# Patient Record
Sex: Male | Born: 1955
Health system: Southern US, Community
[De-identification: ages and names within clinical notes are randomized; demographics above are authoritative.]

## PROBLEM LIST (undated history)

## (undated) DIAGNOSIS — I77810 Thoracic aortic ectasia: Secondary | ICD-10-CM

## (undated) DIAGNOSIS — E119 Type 2 diabetes mellitus without complications: Secondary | ICD-10-CM

## (undated) DIAGNOSIS — I10 Essential (primary) hypertension: Secondary | ICD-10-CM

## (undated) DIAGNOSIS — E785 Hyperlipidemia, unspecified: Secondary | ICD-10-CM

## (undated) DIAGNOSIS — E559 Vitamin D deficiency, unspecified: Secondary | ICD-10-CM

## (undated) DIAGNOSIS — K635 Polyp of colon: Secondary | ICD-10-CM

## (undated) DIAGNOSIS — J449 Chronic obstructive pulmonary disease, unspecified: Secondary | ICD-10-CM

## (undated) DIAGNOSIS — F259 Schizoaffective disorder, unspecified: Secondary | ICD-10-CM

## (undated) DIAGNOSIS — I739 Peripheral vascular disease, unspecified: Secondary | ICD-10-CM

## (undated) DIAGNOSIS — I2510 Atherosclerotic heart disease of native coronary artery without angina pectoris: Secondary | ICD-10-CM

## (undated) DIAGNOSIS — Z87898 Personal history of other specified conditions: Secondary | ICD-10-CM

## (undated) DIAGNOSIS — M51369 Other intervertebral disc degeneration, lumbar region without mention of lumbar back pain or lower extremity pain: Secondary | ICD-10-CM

## (undated) DIAGNOSIS — I4891 Unspecified atrial fibrillation: Secondary | ICD-10-CM

## (undated) DIAGNOSIS — I428 Other cardiomyopathies: Secondary | ICD-10-CM

## (undated) DIAGNOSIS — R0600 Dyspnea, unspecified: Secondary | ICD-10-CM

## (undated) DIAGNOSIS — B1920 Unspecified viral hepatitis C without hepatic coma: Secondary | ICD-10-CM

## (undated) DIAGNOSIS — M7730 Calcaneal spur, unspecified foot: Secondary | ICD-10-CM

## (undated) DIAGNOSIS — K219 Gastro-esophageal reflux disease without esophagitis: Secondary | ICD-10-CM

## (undated) DIAGNOSIS — Z7901 Long term (current) use of anticoagulants: Secondary | ICD-10-CM

## (undated) DIAGNOSIS — K5900 Constipation, unspecified: Secondary | ICD-10-CM

## (undated) DIAGNOSIS — K4090 Unilateral inguinal hernia, without obstruction or gangrene, not specified as recurrent: Secondary | ICD-10-CM

## (undated) DIAGNOSIS — D122 Benign neoplasm of ascending colon: Secondary | ICD-10-CM

## (undated) DIAGNOSIS — I5020 Unspecified systolic (congestive) heart failure: Secondary | ICD-10-CM

## (undated) DIAGNOSIS — D696 Thrombocytopenia, unspecified: Secondary | ICD-10-CM

## (undated) DIAGNOSIS — G2581 Restless legs syndrome: Secondary | ICD-10-CM

## (undated) DIAGNOSIS — I499 Cardiac arrhythmia, unspecified: Secondary | ICD-10-CM

## (undated) DIAGNOSIS — M1990 Unspecified osteoarthritis, unspecified site: Secondary | ICD-10-CM

## (undated) DIAGNOSIS — I700 Atherosclerosis of aorta: Secondary | ICD-10-CM

## (undated) DIAGNOSIS — I272 Pulmonary hypertension, unspecified: Secondary | ICD-10-CM

## (undated) DIAGNOSIS — Z972 Presence of dental prosthetic device (complete) (partial): Secondary | ICD-10-CM

## (undated) DIAGNOSIS — J45909 Unspecified asthma, uncomplicated: Secondary | ICD-10-CM

## (undated) DIAGNOSIS — Z9841 Cataract extraction status, right eye: Secondary | ICD-10-CM

## (undated) DIAGNOSIS — Z8719 Personal history of other diseases of the digestive system: Secondary | ICD-10-CM

## (undated) DIAGNOSIS — K5790 Diverticulosis of intestine, part unspecified, without perforation or abscess without bleeding: Secondary | ICD-10-CM

## (undated) DIAGNOSIS — Z87442 Personal history of urinary calculi: Secondary | ICD-10-CM

## (undated) HISTORY — DX: Chronic obstructive pulmonary disease, unspecified: J44.9

## (undated) HISTORY — DX: Unilateral inguinal hernia, without obstruction or gangrene, not specified as recurrent: K40.90

## (undated) HISTORY — PX: HERNIA REPAIR: SHX51

## (undated) HISTORY — DX: Type 2 diabetes mellitus without complications: E11.9

## (undated) HISTORY — DX: Hyperlipidemia, unspecified: E78.5

## (undated) HISTORY — PX: SINUSOTOMY: SHX291

## (undated) HISTORY — DX: Schizoaffective disorder, unspecified: F25.9

## (undated) HISTORY — PX: BACK SURGERY: SHX140

## (undated) HISTORY — PX: EYE SURGERY: SHX253

## (undated) HISTORY — DX: Essential (primary) hypertension: I10

## (undated) HISTORY — DX: Unspecified viral hepatitis C without hepatic coma: B19.20

## (undated) HISTORY — DX: Vitamin D deficiency, unspecified: E55.9

---

## 2004-11-20 ENCOUNTER — Emergency Department: Payer: Self-pay | Admitting: General Practice

## 2007-12-11 ENCOUNTER — Ambulatory Visit: Payer: Self-pay | Admitting: Family Medicine

## 2009-04-26 DIAGNOSIS — E559 Vitamin D deficiency, unspecified: Secondary | ICD-10-CM | POA: Insufficient documentation

## 2011-08-31 DIAGNOSIS — I10 Essential (primary) hypertension: Secondary | ICD-10-CM | POA: Diagnosis not present

## 2011-08-31 DIAGNOSIS — E785 Hyperlipidemia, unspecified: Secondary | ICD-10-CM | POA: Diagnosis not present

## 2011-08-31 DIAGNOSIS — J449 Chronic obstructive pulmonary disease, unspecified: Secondary | ICD-10-CM | POA: Diagnosis not present

## 2011-08-31 DIAGNOSIS — E118 Type 2 diabetes mellitus with unspecified complications: Secondary | ICD-10-CM | POA: Diagnosis not present

## 2012-03-05 DIAGNOSIS — I10 Essential (primary) hypertension: Secondary | ICD-10-CM | POA: Diagnosis not present

## 2012-03-05 DIAGNOSIS — E118 Type 2 diabetes mellitus with unspecified complications: Secondary | ICD-10-CM | POA: Diagnosis not present

## 2012-03-05 DIAGNOSIS — E785 Hyperlipidemia, unspecified: Secondary | ICD-10-CM | POA: Diagnosis not present

## 2012-03-05 DIAGNOSIS — E1142 Type 2 diabetes mellitus with diabetic polyneuropathy: Secondary | ICD-10-CM | POA: Diagnosis not present

## 2012-09-02 DIAGNOSIS — E118 Type 2 diabetes mellitus with unspecified complications: Secondary | ICD-10-CM | POA: Diagnosis not present

## 2012-09-02 DIAGNOSIS — I10 Essential (primary) hypertension: Secondary | ICD-10-CM | POA: Diagnosis not present

## 2012-09-02 DIAGNOSIS — J449 Chronic obstructive pulmonary disease, unspecified: Secondary | ICD-10-CM | POA: Diagnosis not present

## 2012-09-02 DIAGNOSIS — E785 Hyperlipidemia, unspecified: Secondary | ICD-10-CM | POA: Diagnosis not present

## 2013-03-06 DIAGNOSIS — Z125 Encounter for screening for malignant neoplasm of prostate: Secondary | ICD-10-CM | POA: Diagnosis not present

## 2013-03-06 DIAGNOSIS — Z1159 Encounter for screening for other viral diseases: Secondary | ICD-10-CM | POA: Diagnosis not present

## 2013-03-06 DIAGNOSIS — J449 Chronic obstructive pulmonary disease, unspecified: Secondary | ICD-10-CM | POA: Diagnosis not present

## 2013-03-06 DIAGNOSIS — E118 Type 2 diabetes mellitus with unspecified complications: Secondary | ICD-10-CM | POA: Diagnosis not present

## 2013-03-06 DIAGNOSIS — I10 Essential (primary) hypertension: Secondary | ICD-10-CM | POA: Diagnosis not present

## 2013-03-06 DIAGNOSIS — E785 Hyperlipidemia, unspecified: Secondary | ICD-10-CM | POA: Diagnosis not present

## 2013-09-08 DIAGNOSIS — E785 Hyperlipidemia, unspecified: Secondary | ICD-10-CM | POA: Diagnosis not present

## 2013-09-08 DIAGNOSIS — I10 Essential (primary) hypertension: Secondary | ICD-10-CM | POA: Diagnosis not present

## 2013-09-08 DIAGNOSIS — I739 Peripheral vascular disease, unspecified: Secondary | ICD-10-CM | POA: Diagnosis not present

## 2013-09-08 DIAGNOSIS — E118 Type 2 diabetes mellitus with unspecified complications: Secondary | ICD-10-CM | POA: Diagnosis not present

## 2014-03-12 DIAGNOSIS — I10 Essential (primary) hypertension: Secondary | ICD-10-CM | POA: Diagnosis not present

## 2014-03-12 DIAGNOSIS — G609 Hereditary and idiopathic neuropathy, unspecified: Secondary | ICD-10-CM | POA: Diagnosis not present

## 2014-03-12 DIAGNOSIS — E118 Type 2 diabetes mellitus with unspecified complications: Secondary | ICD-10-CM | POA: Diagnosis not present

## 2014-03-12 DIAGNOSIS — E1349 Other specified diabetes mellitus with other diabetic neurological complication: Secondary | ICD-10-CM | POA: Diagnosis not present

## 2014-03-12 DIAGNOSIS — L84 Corns and callosities: Secondary | ICD-10-CM | POA: Diagnosis not present

## 2014-03-12 DIAGNOSIS — Z0000 Encounter for general adult medical examination without abnormal findings: Secondary | ICD-10-CM | POA: Diagnosis not present

## 2014-03-12 DIAGNOSIS — E1142 Type 2 diabetes mellitus with diabetic polyneuropathy: Secondary | ICD-10-CM | POA: Diagnosis not present

## 2014-03-12 DIAGNOSIS — E785 Hyperlipidemia, unspecified: Secondary | ICD-10-CM | POA: Diagnosis not present

## 2014-03-12 DIAGNOSIS — Z1331 Encounter for screening for depression: Secondary | ICD-10-CM | POA: Diagnosis not present

## 2014-04-09 DIAGNOSIS — E1349 Other specified diabetes mellitus with other diabetic neurological complication: Secondary | ICD-10-CM | POA: Diagnosis not present

## 2014-04-09 DIAGNOSIS — G609 Hereditary and idiopathic neuropathy, unspecified: Secondary | ICD-10-CM | POA: Diagnosis not present

## 2014-04-09 DIAGNOSIS — E1142 Type 2 diabetes mellitus with diabetic polyneuropathy: Secondary | ICD-10-CM | POA: Diagnosis not present

## 2014-04-09 DIAGNOSIS — L84 Corns and callosities: Secondary | ICD-10-CM | POA: Diagnosis not present

## 2014-09-22 DIAGNOSIS — R768 Other specified abnormal immunological findings in serum: Secondary | ICD-10-CM | POA: Diagnosis not present

## 2014-09-22 DIAGNOSIS — E559 Vitamin D deficiency, unspecified: Secondary | ICD-10-CM | POA: Diagnosis not present

## 2014-09-22 DIAGNOSIS — G629 Polyneuropathy, unspecified: Secondary | ICD-10-CM | POA: Diagnosis not present

## 2014-09-22 DIAGNOSIS — J449 Chronic obstructive pulmonary disease, unspecified: Secondary | ICD-10-CM | POA: Diagnosis not present

## 2014-09-22 DIAGNOSIS — I10 Essential (primary) hypertension: Secondary | ICD-10-CM | POA: Diagnosis not present

## 2014-09-22 DIAGNOSIS — E1340 Other specified diabetes mellitus with diabetic neuropathy, unspecified: Secondary | ICD-10-CM | POA: Diagnosis not present

## 2014-09-22 DIAGNOSIS — E785 Hyperlipidemia, unspecified: Secondary | ICD-10-CM | POA: Diagnosis not present

## 2015-02-18 ENCOUNTER — Telehealth: Payer: Self-pay | Admitting: Family Medicine

## 2015-02-18 NOTE — Telephone Encounter (Signed)
Patient needs refills on Lisinopril and Pravastatin to be sent to [facility]. Patient has an appt 03/25/15.

## 2015-02-19 ENCOUNTER — Other Ambulatory Visit: Payer: Self-pay

## 2015-02-19 DIAGNOSIS — E785 Hyperlipidemia, unspecified: Secondary | ICD-10-CM

## 2015-02-19 DIAGNOSIS — I10 Essential (primary) hypertension: Secondary | ICD-10-CM

## 2015-02-19 MED ORDER — PRAVASTATIN SODIUM 40 MG PO TABS: 40.0000 mg | ORAL_TABLET | Freq: Every day | ORAL | Status: DC

## 2015-02-19 MED ORDER — LISINOPRIL 20 MG PO TABS: 20.0000 mg | ORAL_TABLET | Freq: Every day | ORAL | Status: DC

## 2015-02-19 NOTE — Telephone Encounter (Signed)
He needs follow up, I will send one month supply

## 2015-02-28 ENCOUNTER — Other Ambulatory Visit: Payer: Self-pay | Admitting: Family Medicine

## 2015-02-28 NOTE — Telephone Encounter (Signed)
Please make sure he has a follow up scheduled

## 2015-03-08 ENCOUNTER — Encounter: Payer: Self-pay | Admitting: Family Medicine

## 2015-03-08 DIAGNOSIS — I10 Essential (primary) hypertension: Secondary | ICD-10-CM | POA: Insufficient documentation

## 2015-03-08 DIAGNOSIS — L84 Corns and callosities: Secondary | ICD-10-CM | POA: Insufficient documentation

## 2015-03-08 DIAGNOSIS — E119 Type 2 diabetes mellitus without complications: Secondary | ICD-10-CM | POA: Insufficient documentation

## 2015-03-08 DIAGNOSIS — Z720 Tobacco use: Secondary | ICD-10-CM | POA: Insufficient documentation

## 2015-03-08 DIAGNOSIS — J449 Chronic obstructive pulmonary disease, unspecified: Secondary | ICD-10-CM | POA: Insufficient documentation

## 2015-03-08 DIAGNOSIS — E785 Hyperlipidemia, unspecified: Secondary | ICD-10-CM | POA: Insufficient documentation

## 2015-03-08 DIAGNOSIS — I739 Peripheral vascular disease, unspecified: Secondary | ICD-10-CM | POA: Insufficient documentation

## 2015-03-08 DIAGNOSIS — F200 Paranoid schizophrenia: Secondary | ICD-10-CM | POA: Insufficient documentation

## 2015-03-08 DIAGNOSIS — G2581 Restless legs syndrome: Secondary | ICD-10-CM | POA: Insufficient documentation

## 2015-03-08 DIAGNOSIS — G629 Polyneuropathy, unspecified: Secondary | ICD-10-CM | POA: Insufficient documentation

## 2015-03-11 ENCOUNTER — Ambulatory Visit (INDEPENDENT_AMBULATORY_CARE_PROVIDER_SITE_OTHER): Payer: Medicare Other | Admitting: Family Medicine

## 2015-03-11 ENCOUNTER — Encounter: Payer: Self-pay | Admitting: Family Medicine

## 2015-03-11 VITALS — BP 102/74 | HR 60 | Temp 97.5°F | Resp 16 | Ht 71.0 in | Wt 150.4 lb

## 2015-03-11 DIAGNOSIS — F17200 Nicotine dependence, unspecified, uncomplicated: Secondary | ICD-10-CM

## 2015-03-11 DIAGNOSIS — E785 Hyperlipidemia, unspecified: Secondary | ICD-10-CM | POA: Diagnosis not present

## 2015-03-11 DIAGNOSIS — E1340 Other specified diabetes mellitus with diabetic neuropathy, unspecified: Secondary | ICD-10-CM | POA: Diagnosis not present

## 2015-03-11 DIAGNOSIS — J449 Chronic obstructive pulmonary disease, unspecified: Secondary | ICD-10-CM

## 2015-03-11 DIAGNOSIS — D692 Other nonthrombocytopenic purpura: Secondary | ICD-10-CM

## 2015-03-11 DIAGNOSIS — G629 Polyneuropathy, unspecified: Secondary | ICD-10-CM | POA: Diagnosis not present

## 2015-03-11 DIAGNOSIS — E441 Mild protein-calorie malnutrition: Secondary | ICD-10-CM

## 2015-03-11 DIAGNOSIS — I10 Essential (primary) hypertension: Secondary | ICD-10-CM

## 2015-03-11 LAB — POCT UA - MICROALBUMIN: MICROALBUMIN (UR) POC: NEGATIVE mg/L

## 2015-03-11 LAB — POCT GLYCOSYLATED HEMOGLOBIN (HGB A1C): Hemoglobin A1C: 5.5

## 2015-03-11 MED ORDER — PRAVASTATIN SODIUM 40 MG PO TABS: 40.0000 mg | ORAL_TABLET | Freq: Every day | ORAL | Status: DC

## 2015-03-11 MED ORDER — LISINOPRIL 20 MG PO TABS: 20.0000 mg | ORAL_TABLET | Freq: Every day | ORAL | Status: DC

## 2015-03-11 MED ORDER — GABAPENTIN 100 MG PO CAPS: 100.0000 mg | ORAL_CAPSULE | Freq: Three times a day (TID) | ORAL | Status: DC

## 2015-03-11 NOTE — Progress Notes (Signed)
Name: Mark Nash   MRN: [id number]    DOB: 05/23/1956   Date:03/11/2015       Progress Note  Subjective  Chief Complaint  Chief Complaint  Patient presents with  . Medication Refill    6 month F/U  . Diabetes    not checking BG  . COPD  . Hyperlipidemia    pt state getting one through [facility] and its diffrent and making him sick on stomach  . Hypertension  . Schizophrenia    HPI  DMII: he was advised to stop using Levemir, but he continues to take 7 units daily , hgbA1C is down to 5.5 %, he denies hypoglycemia, polydipsia, polyuria or polyphagia.  He is on ace, and has neuropathy.  He states his feet pain is described as burning or tingling, sometimes numb, pain is intermittent, but happens daily   COPD: he is not on medication, because he refuses, he had daily productive cough that lasts about 45 minutes in am. He has also noticed some SOB with activity, he is still smoking about 1 and half pack of cigarettes daily   Hyperlipidemia: he is now taking another form of Pravastatin that came from mail order and has causes some nausea over the past couple of days when he started pravastatin. I explained it may be unrelated and it could be gastritis or GERD or biliary colic but he does not want to be tested at this time  HTN: taking bp medication and denies side effects, bp is towards low end of normal and we will adjust his medication today\  Schizophrenia: denies any hallucinations, no depressed mood or suicidal thoughts or ideation  Patient Active Problem List   Diagnosis Date Noted  . Benign hypertension 03/08/2015  . COPD, mild 03/08/2015  . Dyslipidemia 03/08/2015  . Paranoid schizophrenia 03/08/2015  . Acquired polyneuropathy 03/08/2015  . Restless leg 03/08/2015  . Compulsive tobacco user syndrome 03/08/2015  . Callus of foot 03/08/2015  . Claudication 03/08/2015  . Vitamin D deficiency 04/26/2009    Past Surgical History  Procedure Laterality Date  . Hernia repair       inguinal  . Sinusotomy      Family History  Problem Relation Age of Onset  . Hypertension Mother   . Diabetes Father   . Hypertension Father   . Heart disease Father   . Diverticulitis Brother     History   Social History  . Marital Status: Single    Spouse Name: N/A  . Number of Children: N/A  . Years of Education: N/A   Occupational History  . Not on file.   Social History Main Topics  . Smoking status: Current Every Day Smoker -- 1.50 packs/day for 45 years    Types: Cigarettes  . Smokeless tobacco: Never Used  . Alcohol Use: No  . Drug Use: No  . Sexual Activity: Not Currently   Other Topics Concern  . Not on file   Social History Narrative  . No narrative on file     Current outpatient prescriptions:  .  pravastatin (PRAVACHOL) 40 MG tablet, Take 1 tablet (40 mg total) by mouth daily., Disp: 90 tablet, Rfl: 1 .  Vitamin D, Ergocalciferol, (DRISDOL) 50000 UNITS CAPS capsule, , Disp: , Rfl:  .  lisinopril (PRINIVIL,ZESTRIL) 20 MG tablet, Take 1 tablet (20 mg total) by mouth daily., Disp: 90 tablet, Rfl: 1  Allergies  Allergen Reactions  . Aspirin     makes him feel weird  .  Penicillins      ROS  Constitutional: Negative for fever but has  weight change, lost 8 lbs since last visit  Respiratory: Positive  for productive cough in am and shortness of breath with activity at times   Cardiovascular: Negative for chest pain or palpitations.  Gastrointestinal: Negative for abdominal pain, no bowel changes.  Musculoskeletal: Negative for gait problem or joint swelling.  Skin: Negative for rash.  Neurological: Negative for dizziness or headache.  No other specific complaints in a complete review of systems (except as listed in HPI above).  Objective  Filed Vitals:   03/11/15 1054  BP: 102/74  Pulse: 60  Temp: 97.5 F (36.4 C)  TempSrc: Oral  Resp: 16  Height: 5\' 11"  (1.803 m)  Weight: 150 lb 6.4 oz (68.221 kg)  SpO2: 98%    Body mass  index is 20.99 kg/(m^2).  Physical Exam Constitutional: Patient appears well-developed and thin. No distress.  Eyes:  No scleral icterus.  Neck: Normal range of motion. Neck supple. Cardiovascular: Normal rate, regular rhythm and normal heart sounds.  No murmur heard. No BLE edema. Pulmonary/Chest: Effort normal and breath sounds normal. No respiratory distress. Abdominal: Soft.  There is no tenderness. Psychiatric: Patient has a normal mood and affect. behavior is normal. Judgment and thought content normal. Skin: skin on feet very thin, with some hyperpigmentation and erythema, thick toenails and callus formation, senile purpura Neuro: normal monofilament  Recent Results (from the past 2160 hour(s))  POCT HgB A1C     Status: None   Collection Time: 03/11/15 10:59 AM  Result Value Ref Range   Hemoglobin A1C 5.5   POCT UA - Microalbumin     Status: Normal   Collection Time: 03/11/15 10:59 AM  Result Value Ref Range   Microalbumin Ur, POC negative mg/L   Creatinine, POC  mg/dL   Albumin/Creatinine Ratio, Urine, POC     Diabetic Foot Exam - Simple   Simple Foot Form  Visual Inspection  See comments:  Yes  Sensation Testing  Intact to touch and monofilament testing bilaterally:  Yes  Pulse Check  Posterior Tibialis and Dorsalis pulse intact bilaterally:  Yes  Comments  He has corn formation and thick toe nails        PHQ2/9: Depression screen PHQ 2/9 03/11/2015  Decreased Interest 0  Down, Depressed, Hopeless 0  PHQ - 2 Score 0     Fall Risk: Fall Risk  03/11/2015  Falls in the past year? No      Assessment & Plan  1. Neuropathy due to secondary diabetes hgbA1C is very low, has pain on both feet, add gabapentin, stop insulin, try diet only, glucose fasting should be around 140 and post-prandially below 180 - POCT HgB A1C - POCT UA - Microalbumin  2. Benign hypertension  - Comprehensive Metabolic Panel (CMET) - lisinopril (PRINIVIL,ZESTRIL) 20 MG tablet; Take  1 tablet (20 mg total) by mouth daily.  Dispense: 90 tablet; Refill: 1  3. Protein-calorie malnutrition, mild Lost 8 lbs since last visit , we will stop insulin, explained importance of protein supplementation, he does not want any further testing at this time  4. Compulsive tobacco user syndrome Smoking 1/5 pack daily, trying to cut down, using for stress relieve, offered medication but he refused  5. Dyslipidemia Recheck labs - Lipid Profile - pravastatin (PRAVACHOL) 40 MG tablet; Take 1 tablet (40 mg total) by mouth daily.  Dispense: 90 tablet; Refill: 1  6. COPD, mild Needs to quit smoking,  used to take Spiriva but he states it caused dryness, refuses any other medication at this time  7. Acquired polyneuropathy He is willing to try medication  Gabapentin 100 mg to titrate up to a max of 300 mg three times daily

## 2015-03-29 ENCOUNTER — Other Ambulatory Visit: Payer: Self-pay | Admitting: Family Medicine

## 2015-03-29 NOTE — Telephone Encounter (Signed)
Patient requesting refill. 

## 2015-05-08 ENCOUNTER — Emergency Department
Admission: EM | Admit: 2015-05-08 | Discharge: 2015-05-08 | Disposition: A | Discharge status: Home / Self Care | Payer: Medicare Other | Attending: Emergency Medicine | Admitting: Emergency Medicine

## 2015-05-08 DIAGNOSIS — M545 Low back pain, unspecified: Secondary | ICD-10-CM

## 2015-05-08 DIAGNOSIS — Z720 Tobacco use: Secondary | ICD-10-CM | POA: Diagnosis not present

## 2015-05-08 DIAGNOSIS — Z880 Allergy status to penicillin: Secondary | ICD-10-CM | POA: Insufficient documentation

## 2015-05-08 DIAGNOSIS — E119 Type 2 diabetes mellitus without complications: Secondary | ICD-10-CM | POA: Diagnosis not present

## 2015-05-08 DIAGNOSIS — Z79899 Other long term (current) drug therapy: Secondary | ICD-10-CM | POA: Diagnosis not present

## 2015-05-08 DIAGNOSIS — I10 Essential (primary) hypertension: Secondary | ICD-10-CM | POA: Insufficient documentation

## 2015-05-08 MED ORDER — HYDROCODONE-ACETAMINOPHEN 5-325 MG PO TABS: ORAL_TABLET | ORAL | Status: DC

## 2015-05-08 MED ORDER — HYDROCODONE-ACETAMINOPHEN 5-325 MG PO TABS
1.0000 | ORAL_TABLET | Freq: Once | ORAL | Status: AC
  Administered 2015-05-08: 1 via ORAL
  Filled 2015-05-08: qty 1

## 2015-05-08 NOTE — ED Notes (Signed)
Pt refused wheelchair to the lobby. Pt denies comments/concerns at this time. NAD noted at this time.

## 2015-05-08 NOTE — ED Notes (Signed)
Pt c/o chronic lower back pain, states it flared up over the past 2-3 day/..states it hurts to sit

## 2015-05-08 NOTE — ED Provider Notes (Signed)
[facility] Emergency Department Provider Note  ____________________________________________  Time seen: Approximately 5:33 PM  I have reviewed the triage vital signs and the nursing notes.   HISTORY  Chief Complaint Back Pain    HPI Mark Nash is a 59 y.o. male is here with complaint of low back pain that spread up to 3 days ago. He states that he has this occasionally and has never seen a doctor for this. He states that generally he uses moist heat at home and it goes away. He denies any urinary symptoms or history of kidney stone. He denies any paresthesias into his lower extremities. He denies any recent injuries to his back. He states this pain is similar to his pain in the past and is no different. Currently he states that it hurts to sit and his pain is a 10 out of 10.   Past Medical History  Diagnosis Date  . Vitamin D deficiency   . Hepatitis C   . Schizoaffective disorder   . COPD (chronic obstructive pulmonary disease)   . Hypertension   . Hyperlipidemia   . Diabetes mellitus without complication     Patient Active Problem List   Diagnosis Date Noted  . Benign hypertension 03/08/2015  . COPD, mild 03/08/2015  . Dyslipidemia 03/08/2015  . Paranoid schizophrenia 03/08/2015  . Acquired polyneuropathy 03/08/2015  . Restless leg 03/08/2015  . Compulsive tobacco user syndrome 03/08/2015  . Callus of foot 03/08/2015  . Claudication 03/08/2015  . Vitamin D deficiency 04/26/2009    Past Surgical History  Procedure Laterality Date  . Hernia repair      inguinal  . Sinusotomy      Current Outpatient Rx  Name  Route  Sig  Dispense  Refill  . gabapentin (NEURONTIN) 100 MG capsule   Oral   Take 1 capsule (100 mg total) by mouth 3 (three) times daily. Start low and build up to max of 300mg  three times daily   270 capsule   3   . HYDROcodone-acetaminophen (NORCO/VICODIN) 5-325 MG per tablet      Take 1 every 4-6 hours prn pain   20  tablet   0   . lisinopril (PRINIVIL,ZESTRIL) 20 MG tablet   Oral   Take 1 tablet (20 mg total) by mouth daily.   90 tablet   1   . pravastatin (PRAVACHOL) 40 MG tablet   Oral   Take 1 tablet (40 mg total) by mouth daily.   90 tablet   1   . Vitamin D, Ergocalciferol, (DRISDOL) 50000 UNITS CAPS capsule      TAKE ONE CAPSULE BY MOUTH ONCE A WEEK OR BIWEEKLY AS DIRECTED   4 capsule   6     Allergies Aspirin and Penicillins  Family History  Problem Relation Age of Onset  . Hypertension Mother   . Diabetes Father   . Hypertension Father   . Heart disease Father   . Diverticulitis Brother     Social History Social History  Substance Use Topics  . Smoking status: Current Every Day Smoker -- 1.50 packs/day for 45 years    Types: Cigarettes  . Smokeless tobacco: Never Used  . Alcohol Use: No    Review of Systems Constitutional: No fever/chills Eyes: No visual changes. ENT: No sore throat. Cardiovascular: Denies chest pain. Respiratory: Denies shortness of breath. Gastrointestinal: No abdominal pain.  No nausea, no vomiting.  No diarrhea.  No constipation. Genitourinary: Negative for dysuria. Musculoskeletal: Positive for  back pain. Skin: Negative for rash. Neurological: Negative for headaches, focal weakness or numbness.  10-point ROS otherwise negative.  ____________________________________________   PHYSICAL EXAM:  VITAL SIGNS: ED Triage Vitals  Enc Vitals Group     BP 05/08/15 1647 131/97 mmHg     Pulse Rate 05/08/15 1645 62     Resp 05/08/15 1645 18     Temp 05/08/15 1645 97.7 F (36.5 C)     Temp Source 05/08/15 1645 Oral     SpO2 05/08/15 1645 100 %     Weight 05/08/15 1645 150 lb (68.04 kg)     Height 05/08/15 1645 5\' 11"  (1.803 m)     Head Cir --      Peak Flow --      Pain Score 05/08/15 1646 10     Pain Loc --      Pain Edu? --      Excl. in [place]? --     Constitutional: Alert and oriented. Well appearing and in no acute  distress. Eyes: Conjunctivae are normal. PERRL. EOMI. Head: Atraumatic. Nose: No congestion/rhinnorhea. Neck: No stridor.   Cardiovascular: Normal rate, regular rhythm. Grossly normal heart sounds.  Good peripheral circulation. Respiratory: Normal respiratory effort.  No retractions. Lungs CTAB. Gastrointestinal: Soft and nontender. No distention. No abdominal bruits. No CVA tenderness. Musculoskeletal: Back exam no gross deformity. No muscle spasms were seen. Range of motion is slightly restricted secondary to pain. There is some tenderness on palpation of the left posterior hip and SI joint area. Straight leg raises were approximately 70 with discomfort bilaterally. No lower extremity tenderness nor edema.  No joint effusions. Neurologic:  Normal speech and language. No gross focal neurologic deficits are appreciated. No gait instability. Reflexes were 2+ bilaterally Skin:  Skin is warm, dry and intact. No rash noted. Psychiatric: Mood and affect are normal. Speech and behavior are normal.  ____________________________________________   LABS (all labs ordered are listed, but only abnormal results are displayed)  Labs Reviewed - No data to display ____________________________________________   PROCEDURES  Procedure(s) performed: None  Critical Care performed: No  ____________________________________________   INITIAL IMPRESSION / ASSESSMENT AND PLAN / ED COURSE  Pertinent labs & imaging results that were available during my care of the patient were reviewed by me and considered in my medical decision making (see chart for details).  Patient has a history of GI distress with anti-inflammatory's. He was given Norco in the emergency room with a prescription. He is to follow-up with his doctor next week. He was told he may continue use some moist heat to his back as needed. ____________________________________________   FINAL CLINICAL IMPRESSION(S) / ED DIAGNOSES  Final  diagnoses:  Left-sided low back pain without sciatica      Johnn Hai, PA-C 05/08/15 1750  Carrie Mew, MD 05/09/15 [phone]

## 2015-05-10 DIAGNOSIS — M545 Low back pain: Secondary | ICD-10-CM | POA: Diagnosis not present

## 2015-07-13 ENCOUNTER — Ambulatory Visit (INDEPENDENT_AMBULATORY_CARE_PROVIDER_SITE_OTHER): Payer: Medicare Other | Admitting: Family Medicine

## 2015-07-13 ENCOUNTER — Encounter: Payer: Self-pay | Admitting: Family Medicine

## 2015-07-13 VITALS — BP 128/70 | HR 55 | Temp 97.1°F | Resp 16 | Ht 71.0 in | Wt 153.8 lb

## 2015-07-13 DIAGNOSIS — J449 Chronic obstructive pulmonary disease, unspecified: Secondary | ICD-10-CM | POA: Diagnosis not present

## 2015-07-13 DIAGNOSIS — E785 Hyperlipidemia, unspecified: Secondary | ICD-10-CM | POA: Diagnosis not present

## 2015-07-13 DIAGNOSIS — R894 Abnormal immunological findings in specimens from other organs, systems and tissues: Secondary | ICD-10-CM | POA: Diagnosis not present

## 2015-07-13 DIAGNOSIS — R768 Other specified abnormal immunological findings in serum: Secondary | ICD-10-CM | POA: Insufficient documentation

## 2015-07-13 DIAGNOSIS — K5909 Other constipation: Secondary | ICD-10-CM | POA: Insufficient documentation

## 2015-07-13 DIAGNOSIS — F200 Paranoid schizophrenia: Secondary | ICD-10-CM | POA: Diagnosis not present

## 2015-07-13 DIAGNOSIS — Z23 Encounter for immunization: Secondary | ICD-10-CM | POA: Diagnosis not present

## 2015-07-13 DIAGNOSIS — G629 Polyneuropathy, unspecified: Secondary | ICD-10-CM

## 2015-07-13 DIAGNOSIS — I10 Essential (primary) hypertension: Secondary | ICD-10-CM | POA: Diagnosis not present

## 2015-07-13 DIAGNOSIS — E1340 Other specified diabetes mellitus with diabetic neuropathy, unspecified: Secondary | ICD-10-CM

## 2015-07-13 DIAGNOSIS — K5900 Constipation, unspecified: Secondary | ICD-10-CM | POA: Insufficient documentation

## 2015-07-13 DIAGNOSIS — I739 Peripheral vascular disease, unspecified: Secondary | ICD-10-CM

## 2015-07-13 LAB — POCT GLYCOSYLATED HEMOGLOBIN (HGB A1C): HEMOGLOBIN A1C: 5.7

## 2015-07-13 MED ORDER — POLYETHYLENE GLYCOL 3350 17 GM/SCOOP PO POWD: 17.0000 g | Freq: Two times a day (BID) | ORAL | Status: DC | PRN

## 2015-07-13 MED ORDER — LISINOPRIL 20 MG PO TABS: 20.0000 mg | ORAL_TABLET | Freq: Every day | ORAL | Status: DC

## 2015-07-13 MED ORDER — PRAVASTATIN SODIUM 40 MG PO TABS: 40.0000 mg | ORAL_TABLET | Freq: Every day | ORAL | Status: DC

## 2015-07-13 MED ORDER — INSULIN DETEMIR 100 UNIT/ML FLEXPEN: 5.0000 [IU] | PEN_INJECTOR | Freq: Every day | SUBCUTANEOUS | Status: DC

## 2015-07-13 NOTE — Progress Notes (Signed)
Name: Mark Nash   MRN: [id number]    DOB: [birth year]   Date:07/13/2015       Progress Note  Subjective  Chief Complaint  Chief Complaint  Patient presents with  . Medication Management    4 month F/U  . Diabetes  . Hypertension  . Hyperlipidemia    HPI  DMII: he was advised to stop using Levemir, but he continues to take 6 units daily , hgbA1C is 5.7%, he denies polydipsia, polyuria or polyphagia, he states that about one week ago he had an episode of hypoglycemia. He is on ace, and has neuropathy. He states his feet pain is described as burning or tingling, sometimes numb, pain is intermittent, but happens daily . We gave him Gabapentin but he did not like it and stopped medication   COPD: he is not on medication, because he refuses, he had daily productive cough that lasts about 30 minutes in am. He has also noticed some SOB with activity, he is still smoking about 1 and half pack of cigarettes daily but not ready to quit smoking yet.   Hyperlipidemia: taking Pravastatin from [place] again and is doing well, no side effects  HTN: taking bp medication and denies side effects,bp is at goal  Schizophrenia: denies any hallucinations, no depressed mood or suicidal thoughts or ideation, feeling well, not on medication and refuses to see Psychiatrist.   Hepatitis C antibody positive: he has a remote history of drug use from age 59 till 80. Used injectable drug use at the time, he has positive hepatitis C but refuses to see GI .  Chronic Constipation: he has intermittent periods of constipation. [place] of 1-2, usually dry and has to strain, has the feeling that is in his rectum but does not want to come off. He tried stools softeners but never took it. He usually walks to make symptoms improve.  No blood in stools.   Patient Active Problem List   Diagnosis Date Noted  . Hepatitis C antibody test positive 07/13/2015  . Benign hypertension 03/08/2015  . COPD, mild ([place])  03/08/2015  . Dyslipidemia 03/08/2015  . Paranoid schizophrenia ([place]) 03/08/2015  . Acquired polyneuropathy ([place]) 03/08/2015  . Restless leg 03/08/2015  . Tobacco abuse 03/08/2015  . Callus of foot 03/08/2015  . Claudication ([place]) 03/08/2015  . Vitamin D deficiency 04/26/2009    Past Surgical History  Procedure Laterality Date  . Hernia repair      inguinal  . Sinusotomy      Family History  Problem Relation Age of Onset  . Hypertension Mother   . Diabetes Father   . Hypertension Father   . Heart disease Father   . Diverticulitis Brother     Social History   Social History  . Marital Status: Single    Spouse Name: N/A  . Number of Children: N/A  . Years of Education: N/A   Occupational History  . Not on file.   Social History Main Topics  . Smoking status: Current Every Day Smoker -- 1.50 packs/day for 45 years    Types: Cigarettes  . Smokeless tobacco: Never Used  . Alcohol Use: No  . Drug Use: No  . Sexual Activity: Not Currently   Other Topics Concern  . Not on file   Social History Narrative     Current outpatient prescriptions:  .  Insulin Detemir (LEVEMIR FLEXPEN) 100 UNIT/ML Pen, Inject 5 Units into the skin daily., Disp: 15 mL, Rfl: 0 .  lisinopril (  PRINIVIL,ZESTRIL) 20 MG tablet, Take 1 tablet (20 mg total) by mouth daily., Disp: 90 tablet, Rfl: 1 .  pravastatin (PRAVACHOL) 40 MG tablet, Take 1 tablet (40 mg total) by mouth daily., Disp: 90 tablet, Rfl: 1 .  Vitamin D, Ergocalciferol, (DRISDOL) 50000 UNITS CAPS capsule, TAKE ONE CAPSULE BY MOUTH ONCE A WEEK OR BIWEEKLY AS DIRECTED, Disp: 4 capsule, Rfl: 6  Allergies  Allergen Reactions  . Aspirin     makes him feel weird  . Penicillins      ROS  Constitutional: Negative for fever , positive mild  weight change.  Respiratory: Negative for cough and shortness of breath.   Cardiovascular: Negative for chest pain or palpitations.  Gastrointestinal: Negative for abdominal pain, no bowel  changes.  Musculoskeletal: Negative for gait problem or joint swelling.  Skin: Negative for rash.  Neurological: Negative for dizziness or headache.  No other specific complaints in a complete review of systems (except as listed in HPI above).  Objective  Filed Vitals:   07/13/15 0911  BP: 128/70  Pulse: 55  Temp: 97.1 F (36.2 C)  TempSrc: Oral  Resp: 16  Height: 5\' 11"  (1.803 m)  Weight: 153 lb 12.8 oz (69.763 kg)  SpO2: 95%    Body mass index is 21.46 kg/(m^2).  Physical Exam  Constitutional: Patient appears well-developed and well-nourished, he is thin No distress.  HEENT: head atraumatic, normocephalic, pupils equal and reactive to light, neck supple, throat within normal limits Cardiovascular: Normal rate, regular rhythm and normal heart sounds.  No murmur heard. No BLE edema. Pulmonary/Chest: Effort normal and breath sounds normal. No respiratory distress. Abdominal: Soft.  There is no tenderness. Psychiatric: Patient has a normal mood and affect. behavior is normal. Judgment and thought content normal.  Recent Results (from the past 2160 hour(s))  POCT HgB A1C     Status: Normal   Collection Time: 07/13/15  9:17 AM  Result Value Ref Range   Hemoglobin A1C 5.7     PHQ2/9: Depression screen [facility] 2/9 07/13/2015 03/11/2015  Decreased Interest 0 0  Down, Depressed, Hopeless 0 0  PHQ - 2 Score 0 0    Fall Risk: Fall Risk  07/13/2015 03/11/2015  Falls in the past year? No No    Functional Status Survey: Is the patient deaf or have difficulty hearing?: No Does the patient have difficulty seeing, even when wearing glasses/contacts?: No Does the patient have difficulty concentrating, remembering, or making decisions?: No Does the patient have difficulty walking or climbing stairs?: No Does the patient have difficulty dressing or bathing?: No Does the patient have difficulty doing errands alone such as visiting a doctor's office or shopping?: No    Assessment &  Plan  1. Secondary diabetes mellitus with diabetic neuropathy ([place])  Explained importance of continue to try weaning off Levemir, he will try 5 units - POCT HgB A1C - Insulin Detemir (LEVEMIR FLEXPEN) 100 UNIT/ML Pen; Inject 5 Units into the skin daily.  Dispense: 15 mL; Refill: 0  2. Needs flu shot  - Flu Vaccine QUAD 36+ mos PF IM (Fluarix & Fluzone Quad PF)-refuses  3. Hepatitis C antibody test positive  Refuses to see GI  4. Need for shingles vaccine  - Varicella-zoster vaccine subcutaneous  5. Need for vaccination against Streptococcus pneumoniae  - Pneumococcal conjugate vaccine 13-valent IM  6. Benign hypertension  At goal today - Comprehensive metabolic panel - lisinopril (PRINIVIL,ZESTRIL) 20 MG tablet; Take 1 tablet (20 mg total) by mouth daily.  Dispense:  90 tablet; Refill: 1  7. COPD, mild ([place])  Discussed medication again but he wants to hold off - CBC with Differential/Platelet  8. Acquired polyneuropathy ([place])  Off medication , states symptoms stable, he states his feet are very sensitive, hard to keep the sheets over his legs at night.   9. Paranoid schizophrenia ([place])  Per patient diagnosed years ago, while going through a divorce and problems at work   106. Claudication ([place])  Cramps when he walks, discussed referral to vascular surgeon but he wants to hold off , he states he is doing much better now  11. Dyslipidemia  - Lipid panel - pravastatin (PRAVACHOL) 40 MG tablet; Take 1 tablet (40 mg total) by mouth daily.  Dispense: 90 tablet; Refill: 1  AVACHOL) 40 MG tablet; Take 1 tablet (40 mg total) by mouth daily.  Dispense: 90 tablet; Refill: 1  12. Chronic constipation  - polyethylene glycol powder (GLYCOLAX/MIRALAX) powder; Take 17 g by mouth 2 (two) times daily as needed.  Dispense: 3350 g; Refill: 5

## 2015-07-14 LAB — CBC WITH DIFFERENTIAL/PLATELET
BASOS ABS: 0 10*3/uL (ref 0.0–0.2)
Basos: 0 %
EOS (ABSOLUTE): 0.1 10*3/uL (ref 0.0–0.4)
Eos: 1 %
Hematocrit: 39.8 % (ref 37.5–51.0)
Hemoglobin: 14.2 g/dL (ref 12.6–17.7)
IMMATURE GRANS (ABS): 0 10*3/uL (ref 0.0–0.1)
Immature Granulocytes: 0 %
LYMPHS ABS: 1.6 10*3/uL (ref 0.7–3.1)
LYMPHS: 27 %
MCH: 33.3 pg — AB (ref 26.6–33.0)
MCHC: 35.7 g/dL (ref 31.5–35.7)
MCV: 93 fL (ref 79–97)
MONOS ABS: 0.5 10*3/uL (ref 0.1–0.9)
Monocytes: 8 %
NEUTROS ABS: 3.7 10*3/uL (ref 1.4–7.0)
Neutrophils: 64 %
PLATELETS: 147 10*3/uL — AB (ref 150–379)
RBC: 4.27 x10E6/uL (ref 4.14–5.80)
RDW: 12.4 % (ref 12.3–15.4)
WBC: 5.8 10*3/uL (ref 3.4–10.8)

## 2015-07-14 LAB — COMPREHENSIVE METABOLIC PANEL
ALK PHOS: 82 IU/L (ref 39–117)
ALT: 20 IU/L (ref 0–44)
AST: 17 IU/L (ref 0–40)
Albumin/Globulin Ratio: 2.6 — ABNORMAL HIGH (ref 1.1–2.5)
Albumin: 4.5 g/dL (ref 3.6–4.8)
BILIRUBIN TOTAL: 1.3 mg/dL — AB (ref 0.0–1.2)
BUN/Creatinine Ratio: 18 (ref 10–22)
BUN: 14 mg/dL (ref 8–27)
CHLORIDE: 100 mmol/L (ref 97–106)
CO2: 25 mmol/L (ref 18–29)
Calcium: 9.4 mg/dL (ref 8.6–10.2)
Creatinine, Ser: 0.79 mg/dL (ref 0.76–1.27)
GFR calc Af Amer: 113 mL/min/{1.73_m2} (ref >59)
GFR calc non Af Amer: 98 mL/min/{1.73_m2} (ref >59)
GLUCOSE: 96 mg/dL (ref 65–99)
Globulin, Total: 1.7 g/dL (ref 1.5–4.5)
Potassium: 4.6 mmol/L (ref 3.5–5.2)
Sodium: 141 mmol/L (ref 136–144)
Total Protein: 6.2 g/dL (ref 6.0–8.5)

## 2015-07-14 LAB — LIPID PANEL
CHOL/HDL RATIO: 1.8 ratio (ref 0.0–5.0)
Cholesterol, Total: 112 mg/dL (ref 100–199)
HDL: 62 mg/dL (ref >39)
LDL Calculated: 41 mg/dL (ref 0–99)
Triglycerides: 43 mg/dL (ref 0–149)
VLDL CHOLESTEROL CAL: 9 mg/dL (ref 5–40)

## 2015-08-10 ENCOUNTER — Encounter: Payer: Self-pay | Admitting: Emergency Medicine

## 2015-08-10 ENCOUNTER — Emergency Department
Admission: EM | Admit: 2015-08-10 | Discharge: 2015-08-10 | Disposition: A | Discharge status: Home / Self Care | Payer: Medicare Other | Attending: Emergency Medicine | Admitting: Emergency Medicine

## 2015-08-10 DIAGNOSIS — F17210 Nicotine dependence, cigarettes, uncomplicated: Secondary | ICD-10-CM | POA: Insufficient documentation

## 2015-08-10 DIAGNOSIS — Z794 Long term (current) use of insulin: Secondary | ICD-10-CM | POA: Insufficient documentation

## 2015-08-10 DIAGNOSIS — Z880 Allergy status to penicillin: Secondary | ICD-10-CM | POA: Insufficient documentation

## 2015-08-10 DIAGNOSIS — G8929 Other chronic pain: Secondary | ICD-10-CM | POA: Diagnosis not present

## 2015-08-10 DIAGNOSIS — I10 Essential (primary) hypertension: Secondary | ICD-10-CM | POA: Insufficient documentation

## 2015-08-10 DIAGNOSIS — Z79899 Other long term (current) drug therapy: Secondary | ICD-10-CM | POA: Insufficient documentation

## 2015-08-10 DIAGNOSIS — E119 Type 2 diabetes mellitus without complications: Secondary | ICD-10-CM | POA: Insufficient documentation

## 2015-08-10 DIAGNOSIS — M545 Low back pain: Secondary | ICD-10-CM | POA: Insufficient documentation

## 2015-08-10 DIAGNOSIS — J449 Chronic obstructive pulmonary disease, unspecified: Secondary | ICD-10-CM | POA: Diagnosis not present

## 2015-08-10 MED ORDER — METHOCARBAMOL 500 MG PO TABS: 500.0000 mg | ORAL_TABLET | Freq: Three times a day (TID) | ORAL | Status: DC

## 2015-08-10 NOTE — ED Notes (Signed)
Having lower back pain w/o injury   Hx of back pain in past ambulates well to triage

## 2015-08-10 NOTE — Discharge Instructions (Signed)
Chronic Back Pain ° When back pain lasts longer than 3 months, it is called chronic back pain. People with chronic back pain often go through certain periods that are more intense (flare-ups).  °CAUSES °Chronic back pain can be caused by wear and tear (degeneration) on different structures in your back. These structures include: °· The bones of your spine (vertebrae) and the joints surrounding your spinal cord and nerve roots (facets). °· The strong, fibrous tissues that connect your vertebrae (ligaments). °Degeneration of these structures may result in pressure on your nerves. This can lead to constant pain. °HOME CARE INSTRUCTIONS °· Avoid bending, heavy lifting, prolonged sitting, and activities which make the problem worse. °· Take brief periods of rest throughout the day to reduce your pain. Lying down or standing usually is better than sitting while you are resting. °· Take over-the-counter or prescription medicines only as directed by your caregiver. °SEEK IMMEDIATE MEDICAL CARE IF:  °· You have weakness or numbness in one of your legs or feet. °· You have trouble controlling your bladder or bowels. °· You have nausea, vomiting, abdominal pain, shortness of breath, or fainting. °  °This information is not intended to replace advice given to you by your health care provider. Make sure you discuss any questions you have with your health care provider. °  °Document Released: 09/21/2004 Document Revised: 11/06/2011 Document Reviewed: 02/01/2015 °Elsevier Interactive Patient Education ©2016 Elsevier Inc. ° °Heat Therapy °Heat therapy can help ease sore, stiff, injured, and tight muscles and joints. Heat relaxes your muscles, which may help ease your pain.  °RISKS AND COMPLICATIONS °If you have any of the following conditions, do not use heat therapy unless your health care provider has approved: °· Poor circulation. °· Healing wounds or scarred skin in the area being treated. °· Diabetes, heart disease, or high  blood pressure. °· Not being able to feel (numbness) the area being treated. °· Unusual swelling of the area being treated. °· Active infections. °· Blood clots. °· Cancer. °· Inability to communicate pain. This may include young children and people who have problems with their brain function (dementia). °· Pregnancy. °Heat therapy should only be used on old, pre-existing, or long-lasting (chronic) injuries. Do not use heat therapy on new injuries unless directed by your health care provider. °HOW TO USE HEAT THERAPY °There are several different kinds of heat therapy, including: °· Moist heat pack. °· Warm water bath. °· Hot water bottle. °· Electric heating pad. °· Heated gel pack. °· Heated wrap. °· Electric heating pad. °Use the heat therapy method suggested by your health care provider. Follow your health care provider's instructions on when and how to use heat therapy. °GENERAL HEAT THERAPY RECOMMENDATIONS °· Do not sleep while using heat therapy. Only use heat therapy while you are awake. °· Your skin may turn pink while using heat therapy. Do not use heat therapy if your skin turns red. °· Do not use heat therapy if you have new pain. °· High heat or long exposure to heat can cause burns. Be careful when using heat therapy to avoid burning your skin. °· Do not use heat therapy on areas of your skin that are already irritated, such as with a rash or sunburn. °SEEK MEDICAL CARE IF: °· You have blisters, redness, swelling, or numbness. °· You have new pain. °· Your pain is worse. °MAKE SURE YOU: °· Understand these instructions. °· Will watch your condition. °· Will get help right away if you are not doing well   or get worse.   This information is not intended to replace advice given to you by your health care provider. Make sure you discuss any questions you have with your health care provider.   Document Released: 11/06/2011 Document Revised: 09/04/2014 Document Reviewed: 10/07/2013 Elsevier Interactive  Patient Education 2016 [place] prescription anti-spasm medicine as directed. Apply ice or heat as directed.  Follow-up with Dr. Ancil Boozer for ongoing symptoms.

## 2015-08-10 NOTE — ED Notes (Signed)
AAOx3.  Skin warm and dry. NAD.  Ambulates with easy and steady gait.   

## 2015-08-10 NOTE — ED Provider Notes (Signed)
[facility] Emergency Department Provider Note ____________________________________________  Time seen: 71  I have reviewed the triage vital signs and the nursing notes.  HISTORY  Chief Complaint  Back Pain  HPI Mark Nash is a 59 y.o. male reports to the ED with low back pain without known injury. He has a history of low back pain. He describes that the pain is similar to his baseline back pain which has flared intermittently. He describes increased pain today after a coughing spell. He denies any lower extremity paresthesias, foot drop, or incontinence. He was unable to seek care with his primary care provider's office once they closed at 5:00. He describes his pain at 6/10 in triage. He normally is prescribed muscle relaxants for his intermittent pain.  Past Medical History  Diagnosis Date  . Vitamin D deficiency   . Hepatitis C   . Schizoaffective disorder ([place])   . COPD (chronic obstructive pulmonary disease) ([place])   . Hypertension   . Hyperlipidemia   . Diabetes mellitus without complication [facility])     Patient Active Problem List   Diagnosis Date Noted  . Hepatitis C antibody test positive 07/13/2015  . Chronic constipation 07/13/2015  . Benign hypertension 03/08/2015  . COPD, mild ([place]) 03/08/2015  . Dyslipidemia 03/08/2015  . Paranoid schizophrenia ([place]) 03/08/2015  . Acquired polyneuropathy ([place]) 03/08/2015  . Restless leg 03/08/2015  . Tobacco abuse 03/08/2015  . Callus of foot 03/08/2015  . Claudication ([place]) 03/08/2015  . Vitamin D deficiency 04/26/2009    Past Surgical History  Procedure Laterality Date  . Hernia repair      inguinal  . Sinusotomy      Current Outpatient Rx  Name  Route  Sig  Dispense  Refill  . Insulin Detemir (LEVEMIR FLEXPEN) 100 UNIT/ML Pen   Subcutaneous   Inject 5 Units into the skin daily.   15 mL   0   . lisinopril (PRINIVIL,ZESTRIL) 20 MG tablet   Oral   Take 1 tablet (20 mg total) by mouth  daily.   90 tablet   1   . methocarbamol (ROBAXIN) 500 MG tablet   Oral   Take 1 tablet (500 mg total) by mouth 3 (three) times daily.   21 tablet   0   . polyethylene glycol powder (GLYCOLAX/MIRALAX) powder   Oral   Take 17 g by mouth 2 (two) times daily as needed.   3350 g   5   . pravastatin (PRAVACHOL) 40 MG tablet   Oral   Take 1 tablet (40 mg total) by mouth daily.   90 tablet   1   . Vitamin D, Ergocalciferol, (DRISDOL) 50000 UNITS CAPS capsule      TAKE ONE CAPSULE BY MOUTH ONCE A WEEK OR BIWEEKLY AS DIRECTED   4 capsule   6    Allergies Aspirin and Penicillins  Family History  Problem Relation Age of Onset  . Hypertension Mother   . Diabetes Father   . Hypertension Father   . Heart disease Father   . Diverticulitis Brother    Social History Social History  Substance Use Topics  . Smoking status: Current Every Day Smoker -- 1.50 packs/day for 45 years    Types: Cigarettes  . Smokeless tobacco: Never Used  . Alcohol Use: No   Review of Systems  Constitutional: Negative for fever. Eyes: Negative for visual changes. ENT: Negative for sore throat. Cardiovascular: Negative for chest pain. Respiratory: Negative for shortness of breath. Gastrointestinal:  Negative for abdominal pain, vomiting and diarrhea. Genitourinary: Negative for dysuria. Musculoskeletal: Positive for back pain. Skin: Negative for rash. Neurological: Negative for headaches, focal weakness or numbness. ____________________________________________  PHYSICAL EXAM:  VITAL SIGNS: ED Triage Vitals  Enc Vitals Group     BP 08/10/15 1814 129/70 mmHg     Pulse Rate 08/10/15 1814 74     Resp 08/10/15 1814 20     Temp 08/10/15 1814 97.9 F (36.6 C)     Temp Source 08/10/15 1814 Oral     SpO2 08/10/15 1814 98 %     Weight 08/10/15 1814 155 lb (70.308 kg)     Height 08/10/15 1814 5\' 11"  (1.803 m)     Head Cir --      Peak Flow --      Pain Score 08/10/15 1801 6     Pain Loc --       Pain Edu? --      Excl. in [place]? --    Constitutional: Alert and oriented. Well appearing and in no distress. Head: Normocephalic and atraumatic.      Eyes: Conjunctivae are normal. PERRL. Normal extraocular movements      Ears: Canals clear. TMs intact bilaterally.   Nose: No congestion/rhinorrhea.   Mouth/Throat: Mucous membranes are moist.   Neck: Supple. No thyromegaly. Hematological/Lymphatic/Immunological: No cervical lymphadenopathy. Cardiovascular: Normal rate, regular rhythm.  Respiratory: Normal respiratory effort. No wheezes/rales/rhonchi. Gastrointestinal: Soft and nontender. No distention. Musculoskeletal: Cranial nerves II through XII grossly intact. Normal LE DTRs bilaterally. Nontender with normal range of motion in all extremities.  Neurologic:  Normal gait without ataxia. Normal speech and language. No gross focal neurologic deficits are appreciated. Skin:  Skin is warm, dry and intact. No rash noted. Psychiatric: Mood and affect are normal. Patient exhibits appropriate insight and judgment. ____________________________________________  INITIAL IMPRESSION / ASSESSMENT AND PLAN / ED COURSE  Patient with acute flare of his chronic back pain. He denies any pain above his baseline. He will be discharged with prescription for methocarbamol to dose as directed. He is to follow with Dr. Ancil Boozer as needed for ongoing symptoms. ____________________________________________  FINAL CLINICAL IMPRESSION(S) / ED DIAGNOSES  Final diagnoses:  Acute exacerbation of chronic low back pain      Melvenia Needles, PA-C 08/10/15 1931  Orbie Pyo, MD 08/11/15 2312

## 2015-08-24 ENCOUNTER — Other Ambulatory Visit: Payer: Self-pay | Admitting: Family Medicine

## 2015-08-24 NOTE — Telephone Encounter (Signed)
Patient requesting refill. 

## 2015-08-26 ENCOUNTER — Other Ambulatory Visit: Payer: Self-pay | Admitting: Family Medicine

## 2015-08-26 ENCOUNTER — Other Ambulatory Visit: Payer: Self-pay

## 2015-08-26 MED ORDER — INSULIN DETEMIR 100 UNIT/ML ~~LOC~~ SOLN: 5.0000 [IU] | Freq: Every day | SUBCUTANEOUS | Status: DC

## 2015-08-26 NOTE — Telephone Encounter (Signed)
[facility] faxed [place] that they patient Insurance will only cover Levemir vials and patient is fine with that. Asked that we please switch to vial instead of flexpen.

## 2015-08-30 DIAGNOSIS — M545 Low back pain: Secondary | ICD-10-CM | POA: Diagnosis not present

## 2015-08-30 DIAGNOSIS — M5136 Other intervertebral disc degeneration, lumbar region: Secondary | ICD-10-CM | POA: Diagnosis not present

## 2015-09-02 DIAGNOSIS — R600 Localized edema: Secondary | ICD-10-CM | POA: Diagnosis not present

## 2015-09-07 ENCOUNTER — Ambulatory Visit: Payer: Medicare Other | Admitting: Family Medicine

## 2015-09-16 ENCOUNTER — Encounter: Payer: Self-pay | Admitting: Family Medicine

## 2015-09-16 ENCOUNTER — Ambulatory Visit (INDEPENDENT_AMBULATORY_CARE_PROVIDER_SITE_OTHER): Payer: PPO | Admitting: Family Medicine

## 2015-09-16 VITALS — BP 118/72 | HR 61 | Temp 98.2°F | Resp 12 | Wt 162.9 lb

## 2015-09-16 DIAGNOSIS — D692 Other nonthrombocytopenic purpura: Secondary | ICD-10-CM | POA: Diagnosis not present

## 2015-09-16 DIAGNOSIS — M545 Low back pain, unspecified: Secondary | ICD-10-CM

## 2015-09-16 DIAGNOSIS — M5417 Radiculopathy, lumbosacral region: Secondary | ICD-10-CM

## 2015-09-16 DIAGNOSIS — M5416 Radiculopathy, lumbar region: Secondary | ICD-10-CM | POA: Diagnosis not present

## 2015-09-16 MED ORDER — PREDNISONE 10 MG (48) PO TBPK: ORAL_TABLET | Freq: Every day | ORAL | Status: DC

## 2015-09-16 MED ORDER — METHOCARBAMOL 500 MG PO TABS: 500.0000 mg | ORAL_TABLET | Freq: Three times a day (TID) | ORAL | Status: DC | PRN

## 2015-09-16 NOTE — Progress Notes (Addendum)
Name: Mark Nash   MRN: [id number]    DOB: 26-Jan-1956   Date:09/16/2015       Progress Note  Subjective  Chief Complaint  Chief Complaint  Patient presents with  . 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  He states over the past month the pain has been much more intense, and had three trips to [facility] because of it over the past couple of months. It seems to be triggered from coughing or picking something up that is a little heavier. Each episode lasts days, and last time had pain that radiated to right lower inner thigh, no loss of bowel movement or urinary incontinence. No saddle anesthesia. He states he had a X-ray that showed OA, but not available for review. He states pain is not as intense right now, but still has decrease in rom.  Currently he is not taking medications for it. Back is stiff and worse in am's  Patient Active Problem List   Diagnosis Date Noted  . Hepatitis C antibody test positive 07/13/2015  . Chronic constipation 07/13/2015  . Abnormal immunological findings in specimens from other organs, systems and tissues 07/13/2015  . CN (constipation) 07/13/2015  . Benign hypertension 03/08/2015  . COPD, mild ([place]) 03/08/2015  . Dyslipidemia 03/08/2015  . Paranoid schizophrenia ([place]) 03/08/2015  . Acquired polyneuropathy ([place]) 03/08/2015  . Restless leg 03/08/2015  . Tobacco abuse 03/08/2015  . Callus of foot 03/08/2015  . Claudication ([place]) 03/08/2015  . Chronic obstructive pulmonary disease ([place]) 03/08/2015  . Corn or callus 03/08/2015  . Essential (primary) hypertension 03/08/2015  . HLD (hyperlipidemia) 03/08/2015  . Peripheral vascular disease ([place]) 03/08/2015  . Polyneuropathy ([place]) 03/08/2015  . Current tobacco use 03/08/2015  . Vitamin D deficiency 04/26/2009   . Avitaminosis D 04/26/2009    Past Surgical History  Procedure Laterality Date  . Hernia repair      inguinal  . Sinusotomy      Family History  Problem Relation Age of Onset  . Hypertension Mother   . Diabetes Father   . Hypertension Father   . Heart disease Father   . Diverticulitis Brother     Social History   Social History  . Marital Status: Single    Spouse Name: N/A  . Number of Children: N/A  . Years of Education: N/A   Occupational History  . Not on file.   Social History Main Topics  . Smoking status: Current Every Day Smoker -- 1.50 packs/day for 45 years    Types: Cigarettes  . Smokeless tobacco: Never Used  . Alcohol Use: No  . Drug Use: No  . Sexual Activity: Not Currently   Other Topics Concern  . Not on file   Social History Narrative     Current outpatient prescriptions:  .  hydrochlorothiazide (HYDRODIURIL) 25 MG tablet, Take by mouth., Disp: , Rfl:  .  insulin detemir (LEVEMIR) 100 UNIT/ML injection, Inject 0.05 mLs (5 Units total) into the skin at bedtime., Disp: 15 mL, Rfl: 2 .  lisinopril (PRINIVIL,ZESTRIL) 20 MG tablet, Take 1 tablet (20 mg total) by mouth daily., Disp: 90 tablet, Rfl: 1 .  methocarbamol (ROBAXIN) 500 MG tablet, Take 1 tablet (500 mg total) by mouth every 8 (eight) hours as  needed for muscle spasms., Disp: 90 tablet, Rfl: 0 .  polyethylene glycol powder (GLYCOLAX/MIRALAX) powder, Take 17 g by mouth 2 (two) times daily as needed., Disp: 3350 g, Rfl: 5 .  pravastatin (PRAVACHOL) 40 MG tablet, Take 1 tablet (40 mg total) by mouth daily., Disp: 90 tablet, Rfl: 1 .  Vitamin D, Ergocalciferol, (DRISDOL) 50000 UNITS CAPS capsule, TAKE ONE CAPSULE BY MOUTH ONCE A WEEK OR BIWEEKLY AS DIRECTED, Disp: 4 capsule, Rfl: 6 .  predniSONE (STERAPRED UNI-PAK 48 TAB) 10 MG (48) TBPK tablet, Take by mouth daily. As directed on package, Disp: 48 tablet, Rfl: 0  Allergies  Allergen Reactions  . Cyclobenzaprine Swelling  . Aspirin      makes him feel weird  . Penicillins      ROS  Constitutional: Negative for fever , positive for  weight gain  Respiratory: Positive  for chronic cough no shortness of breath.   Cardiovascular: Negative for chest pain or palpitations.  Gastrointestinal: Negative for abdominal pain, no bowel changes.  Musculoskeletal: Negative for gait problem or joint swelling.  Skin: Negative for rash.  Neurological: Negative for dizziness or headache.  No other specific complaints in a complete review of systems (except as listed in HPI above).  Objective  Filed Vitals:   09/16/15 1425  BP: 118/72  Pulse: 61  Temp: 98.2 F (36.8 C)  TempSrc: Oral  Resp: 12  Weight: 162 lb 14.4 oz (73.891 kg)  SpO2: 98%    Body mass index is 22.73 kg/(m^2).  Physical Exam  Constitutional: Patient appears well-developed and well-nourished. No distress.  HEENT: head atraumatic, normocephalic, pupils equal and reactive to light,  neck supple, throat within normal limits Cardiovascular: Normal rate, regular rhythm and normal heart sounds.  No murmur heard. No BLE edema. Pulmonary/Chest: Effort normal and breath sounds normal. No respiratory distress. Abdominal: Soft.  There is no tenderness. Psychiatric: Patient has a normal mood and affect. behavior is normal. Judgment and thought content normal. Muscular Skeletal: pain during palpation of lumbar spine, positive straight leg on right side, decrease in sensation on left inner thigh, patella reflexes is normal on both sides. Decrease rom , worse with extension and lateral bending Skin: ecchymosis of right hand  Recent Results (from the past 2160 hour(s))  POCT HgB A1C     Status: Normal   Collection Time: 07/13/15  9:17 AM  Result Value Ref Range   Hemoglobin A1C 5.7   Lipid panel     Status: None   Collection Time: 07/13/15 10:18 AM  Result Value Ref Range   Cholesterol, Total 112 100 - 199 mg/dL   Triglycerides 43 0 - 149 mg/dL   HDL 62 >39 mg/dL     Comment: According to ATP-III Guidelines, HDL-C >59 mg/dL is considered a negative risk factor for CHD.    VLDL Cholesterol Cal 9 5 - 40 mg/dL   LDL Calculated 41 0 - 99 mg/dL   Chol/HDL Ratio 1.8 0.0 - 5.0 ratio units    Comment:                                   T. Chol/HDL Ratio                                             Men  Women  1/2 Avg.Risk  3.4    3.3                                   Avg.Risk  5.0    4.4                                2X Avg.Risk  9.6    7.1                                3X Avg.Risk 23.4   11.0   Comprehensive metabolic panel     Status: Abnormal   Collection Time: 07/13/15 10:18 AM  Result Value Ref Range   Glucose 96 65 - 99 mg/dL   BUN 14 8 - 27 mg/dL   Creatinine, Ser 0.79 0.76 - 1.27 mg/dL   GFR calc non Af Amer 98 >59 mL/min/1.73   GFR calc Af Amer 113 >59 mL/min/1.73   BUN/Creatinine Ratio 18 10 - 22   Sodium 141 136 - 144 mmol/L   Potassium 4.6 3.5 - 5.2 mmol/L   Chloride 100 97 - 106 mmol/L   CO2 25 18 - 29 mmol/L   Calcium 9.4 8.6 - 10.2 mg/dL   Total Protein 6.2 6.0 - 8.5 g/dL   Albumin 4.5 3.6 - 4.8 g/dL   Globulin, Total 1.7 1.5 - 4.5 g/dL   Albumin/Globulin Ratio 2.6 (H) 1.1 - 2.5   Bilirubin Total 1.3 (H) 0.0 - 1.2 mg/dL   Alkaline Phosphatase 82 39 - 117 IU/L   AST 17 0 - 40 IU/L   ALT 20 0 - 44 IU/L  CBC with Differential/Platelet     Status: Abnormal   Collection Time: 07/13/15 10:18 AM  Result Value Ref Range   WBC 5.8 3.4 - 10.8 x10E3/uL   RBC 4.27 4.14 - 5.80 x10E6/uL   Hemoglobin 14.2 12.6 - 17.7 g/dL   Hematocrit 39.8 37.5 - 51.0 %   MCV 93 79 - 97 fL   MCH 33.3 (H) 26.6 - 33.0 pg   MCHC 35.7 31.5 - 35.7 g/dL   RDW 12.4 12.3 - 15.4 %   Platelets 147 (L) 150 - 379 x10E3/uL   Neutrophils 64 %   Lymphs 27 %   Monocytes 8 %   Eos 1 %   Basos 0 %   Neutrophils Absolute 3.7 1.4 - 7.0 x10E3/uL   Lymphocytes Absolute 1.6 0.7 - 3.1 x10E3/uL   Monocytes Absolute 0.5 0.1 - 0.9 x10E3/uL   EOS  (ABSOLUTE) 0.1 0.0 - 0.4 x10E3/uL   Basophils Absolute 0.0 0.0 - 0.2 x10E3/uL   Immature Granulocytes 0 %   Immature Grans (Abs) 0.0 0.0 - 0.1 x10E3/uL      PHQ2/9: Depression screen [facility] 2/9 09/16/2015 07/13/2015 03/11/2015  Decreased Interest 0 0 0  Down, Depressed, Hopeless 0 0 0  PHQ - 2 Score 0 0 0    Fall Risk: Fall Risk  09/16/2015 07/13/2015 03/11/2015  Falls in the past year? No No No      Functional Status Survey: Is the patient deaf or have difficulty hearing?: No Does the patient have difficulty seeing, even when wearing glasses/contacts?: No Does the patient have difficulty concentrating, remembering, or making decisions?: No Does the patient have difficulty walking or climbing stairs?: Yes (when he has flare  ups) Does the patient have difficulty dressing or bathing?: No Does the patient have difficulty doing errands alone such as visiting a doctor's office or shopping?: No    Assessment & Plan  1. Intermittent low back pain  - methocarbamol (ROBAXIN) 500 MG tablet; Take 1 tablet (500 mg total) by mouth every 8 (eight) hours as needed for muscle spasms.  Dispense: 90 tablet; Refill: 0  2. Radiculitis, lumbosacral  Explained that it will raise his glucose, he needs to check fsbs at least twice daily while taking prednisone, avoid sweets and drink plenty of water, if no improvement we will order MRI of lumbar spine - predniSONE (STERAPRED UNI-PAK 48 TAB) 10 MG (48) TBPK tablet; Take by mouth daily. As directed on package  Dispense: 48 tablet; Refill: 0  3. Purpura, nonthrombocytopenic ([place])  Found on clinical exam

## 2015-09-16 NOTE — Patient Instructions (Signed)
Lumbosacral Radiculopathy °Lumbosacral radiculopathy is a condition that involves the spinal nerves and nerve roots in the low back and bottom of the spine. The condition develops when these nerves and nerve roots move out of place or become inflamed and cause symptoms. °CAUSES °This condition may be caused by: °· Pressure from a disk that bulges out of place (herniated disk). A disk is a plate of cartilage that separates bones in the spine. °· Disk degeneration. °· A narrowing of the bones of the lower back (spinal stenosis). °· A tumor. °· An infection. °· An injury that places sudden pressure on the disks that cushion the bones of your lower spine. °RISK FACTORS °This condition is more likely to develop in: °· Males aged 30-50 years. °· Females aged 50-60 years. °· People who lift improperly. °· People who are overweight or live a sedentary lifestyle. °· People who smoke. °· People who perform repetitive activities that strain the spine. °SYMPTOMS °Symptoms of this condition include: °· Pain that goes down from the back into the legs (sciatica). This is the most common symptom. The pain may be worse with sitting, coughing, or sneezing. °· Pain and numbness in the arms and legs. °· Muscle weakness. °· Tingling. °· Loss of bladder control or bowel control. °DIAGNOSIS °This condition is diagnosed with a physical exam and medical history. If the pain is lasting, you may have tests, such as: °· MRI scan. °· X-ray. °· CT scan. °· Myelogram. °· Nerve conduction study. °TREATMENT °This condition is often treated with: °· Hot packs and ice applied to affected areas. °· Stretches to improve flexibility. °· Exercises to strengthen back muscles. °· Physical therapy. °· Pain medicine. °· A steroid injection in the spine. °In some cases, no treatment is needed. If the condition is long-lasting (chronic), or if symptoms are severe, treatment may involve surgery or lifestyle changes, such as following a weight loss plan. °HOME  CARE INSTRUCTIONS °Medicines °· Take medicines only as directed by your health care provider. °· Do not drive or operate heavy machinery while taking pain medicine. °Injury Care °· Apply a heat pack to the injured area as directed by your health care provider. °· Apply ice to the affected area: °¨ Put ice in a plastic bag. °¨ Place a towel between your skin and the bag. °¨ Leave the ice on for 20-30 minutes, every 2 hours while you are awake or as needed. Or, leave the ice on for as long as directed by your health care provider. °Other Instructions °· If you were shown how to do any exercises or stretches, do them as directed by your health care provider. °· If your health care provider prescribed a diet or exercise program, follow it as directed. °· Keep all follow-up visits as directed by your health care provider. This is important. °SEEK MEDICAL CARE IF: °· Your pain does not improve over time even when taking pain medicines. °SEEK IMMEDIATE MEDICAL CARE IF: °· Your develop severe pain. °· Your pain suddenly gets worse. °· You develop increasing weakness in your legs. °· You lose the ability to control your bladder or bowel. °· You have difficulty walking or balancing. °· You have a fever. °  °This information is not intended to replace advice given to you by your health care provider. Make sure you discuss any questions you have with your health care provider. °  °Document Released: 08/14/2005 Document Revised: 12/29/2014 Document Reviewed: 08/10/2014 °Elsevier Interactive Patient Education ©2016 Elsevier Inc. ° °

## 2015-10-28 ENCOUNTER — Telehealth: Payer: Self-pay | Admitting: Family Medicine

## 2015-10-28 DIAGNOSIS — M545 Low back pain, unspecified: Secondary | ICD-10-CM

## 2015-10-28 MED ORDER — METHOCARBAMOL 500 MG PO TABS: 500.0000 mg | ORAL_TABLET | Freq: Three times a day (TID) | ORAL | Status: DC | PRN

## 2015-10-28 NOTE — Telephone Encounter (Signed)
Patient is scheduled to come in on Tues. 11/02/15 @ 11:20am, but he is wanting to know if something that can be sent in for his back to the [facility] on [address].

## 2015-10-28 NOTE — Telephone Encounter (Signed)
Sent refill of Robaxin

## 2015-10-29 ENCOUNTER — Ambulatory Visit (INDEPENDENT_AMBULATORY_CARE_PROVIDER_SITE_OTHER): Payer: PPO | Admitting: Family Medicine

## 2015-10-29 ENCOUNTER — Encounter: Payer: Self-pay | Admitting: Family Medicine

## 2015-10-29 VITALS — BP 116/74 | HR 62 | Temp 98.0°F | Resp 12 | Ht 71.0 in | Wt 165.1 lb

## 2015-10-29 DIAGNOSIS — E1340 Other specified diabetes mellitus with diabetic neuropathy, unspecified: Secondary | ICD-10-CM

## 2015-10-29 DIAGNOSIS — M24541 Contracture, right hand: Secondary | ICD-10-CM | POA: Diagnosis not present

## 2015-10-29 DIAGNOSIS — M5417 Radiculopathy, lumbosacral region: Secondary | ICD-10-CM

## 2015-10-29 DIAGNOSIS — M5416 Radiculopathy, lumbar region: Secondary | ICD-10-CM | POA: Diagnosis not present

## 2015-10-29 MED ORDER — OXYCODONE-ACETAMINOPHEN 10-325 MG PO TABS: 1.0000 | ORAL_TABLET | ORAL | Status: DC | PRN

## 2015-10-29 NOTE — Progress Notes (Signed)
Name: Mark Nash   MRN: [id number]    DOB: 1956-02-06   Date:10/29/2015       Progress Note  Subjective  Chief Complaint  Chief Complaint  Patient presents with  . Follow-up  . Back Pain    Onset- November 2016, [place], pain is intermittently, edema in bilateral feet and ankles. Patient states he was put on a steroid last visit and felt better but once he finished the medication, every week after the pain progressly got worst.   . Diabetes    Patient is working on dietary modifications and can tell when his blood sugar is out of range, but states it has been running normal since being off of steroids.     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 He states over the few months the pain has been much more intense, and had several  trips to [facility] . It seems to be triggered from coughing or picking something up that is a little heavier, also sleeping on his side. Each episode lasting weeks now instead of days and more intense. Current episode has been present for the past week, the  Pain is described as pressure like if he needs to have a bowel movement, and radiates  to right lower inner thigh and sometimes down to his right foot, no loss of bowel movement or urinary incontinence. No saddle anesthesia. He states he had a X-ray that showed OA, but not available for review. I gave him prednisone back in January and symptoms resolved, but back again now. He has to sleep flat on his back, at times difficulty bearing weight on right leg. Glucose was very high while of steroids, but is coming down now. Following a diabetic diet. Pain right now is 7/10 but can go up to 10/10   Patient Active Problem List   Diagnosis Date Noted  . Hepatitis C antibody test positive 07/13/2015  . Chronic constipation 07/13/2015  . Abnormal immunological findings in  specimens from other organs, systems and tissues 07/13/2015  . CN (constipation) 07/13/2015  . Benign hypertension 03/08/2015  . COPD, mild ([place]) 03/08/2015  . Dyslipidemia 03/08/2015  . Paranoid schizophrenia ([place]) 03/08/2015  . Acquired polyneuropathy ([place]) 03/08/2015  . Restless leg 03/08/2015  . Tobacco abuse 03/08/2015  . Callus of foot 03/08/2015  . Claudication ([place]) 03/08/2015  . Chronic obstructive pulmonary disease ([place]) 03/08/2015  . Corn or callus 03/08/2015  . Essential (primary) hypertension 03/08/2015  . HLD (hyperlipidemia) 03/08/2015  . Peripheral vascular disease ([place]) 03/08/2015  . Polyneuropathy ([place]) 03/08/2015  . Current tobacco use 03/08/2015  . Vitamin D deficiency 04/26/2009  . Avitaminosis D 04/26/2009    Past Surgical History  Procedure Laterality Date  . Hernia repair      inguinal  . Sinusotomy      Family History  Problem Relation Age of Onset  . Hypertension Mother   . Diabetes Father   . Hypertension Father   . Heart disease Father   . Diverticulitis Brother     Social History   Social History  . Marital Status: Single    Spouse Name: N/A  . Number of Children: N/A  . Years of Education: N/A   Occupational History  . Not on file.   Social History Main Topics  . Smoking status: Current Every Day Smoker -- 1.50  packs/day for 45 years    Types: Cigarettes  . Smokeless tobacco: Never Used  . Alcohol Use: No  . Drug Use: No  . Sexual Activity: Not Currently   Other Topics Concern  . Not on file   Social History Narrative     Current outpatient prescriptions:  .  hydrochlorothiazide (HYDRODIURIL) 25 MG tablet, Take by mouth., Disp: , Rfl:  .  insulin detemir (LEVEMIR) 100 UNIT/ML injection, Inject 0.05 mLs (5 Units total) into the skin at bedtime., Disp: 15 mL, Rfl: 2 .  lisinopril (PRINIVIL,ZESTRIL) 20 MG tablet, Take 1 tablet (20 mg total) by mouth daily., Disp: 90 tablet, Rfl: 1 .  methocarbamol (ROBAXIN) 500 MG tablet,  Take 1 tablet (500 mg total) by mouth every 8 (eight) hours as needed for muscle spasms., Disp: 90 tablet, Rfl: 0 .  polyethylene glycol powder (GLYCOLAX/MIRALAX) powder, Take 17 g by mouth 2 (two) times daily as needed., Disp: 3350 g, Rfl: 5 .  pravastatin (PRAVACHOL) 40 MG tablet, Take 1 tablet (40 mg total) by mouth daily., Disp: 90 tablet, Rfl: 1 .  Vitamin D, Ergocalciferol, (DRISDOL) 50000 UNITS CAPS capsule, TAKE ONE CAPSULE BY MOUTH ONCE A WEEK OR BIWEEKLY AS DIRECTED, Disp: 4 capsule, Rfl: 6  Allergies  Allergen Reactions  . Cyclobenzaprine Swelling  . Aspirin     makes him feel weird  . Penicillins      ROS  Ten systems reviewed and is negative except as mentioned in HPI , gets nauseated and pain is very intense. He is also having some nasal congestion   Objective  Filed Vitals:   10/29/15 1138  BP: 116/74  Pulse: 62  Temp: 98 F (36.7 C)  TempSrc: Oral  Resp: 12  Height: 5\' 11"  (1.803 m)  Weight: 165 lb 1.6 oz (74.889 kg)  SpO2: 98%    Body mass index is 23.04 kg/(m^2).  Physical Exam  Constitutional: Patient appears well-developed and well-nourished. No distress.  HEENT: head atraumatic, normocephalic, pupils equal and reactive to light, neck supple, throat within normal limits Cardiovascular: Normal rate, regular rhythm and normal heart sounds. No murmur heard. No BLE edema. Pulmonary/Chest: Effort normal and breath sounds normal. No respiratory distress. Abdominal: Soft. There is no tenderness. Psychiatric: Patient has a normal mood and affect. behavior is normal. Judgment and thought content normal. Muscular Skeletal: pain during palpation of lumbar spine, positive straight leg on right side, increase  in sensation on right  inner thigh, patella reflexes is normal on both sides. Decrease rom , worse with extension and lateral bending. Contracture of right middle and ring finger  Skin: ecchymosis of right forearm  PHQ2/9: Depression screen [facility] 2/9  09/16/2015 07/13/2015 03/11/2015  Decreased Interest 0 0 0  Down, Depressed, Hopeless 0 0 0  PHQ - 2 Score 0 0 0     Fall Risk: Fall Risk  09/16/2015 07/13/2015 03/11/2015  Falls in the past year? No No No      Assessment & Plan  1. Radiculitis, lumbosacral  - MR Lumbar Spine Wo Contrast; Future - Refill of Robaxin 500 mg twice daily and try  - oxyCODONE-acetaminophen (PERCOCET) 10-325 MG tablet; Take 1 tablet by mouth every 4 (four) hours as needed for pain.  Dispense: 30 tablet; Refill: 0   2. Neuropathy due to secondary diabetes ([place])  Continue diet, glucose coming down - since off steroids  3. Contracture of joint of finger of right hand  Not painful right now, he wants to hold off on  referral to Ortho

## 2015-11-02 ENCOUNTER — Telehealth: Payer: Self-pay

## 2015-11-02 ENCOUNTER — Ambulatory Visit: Payer: PPO | Admitting: Family Medicine

## 2015-11-02 NOTE — Telephone Encounter (Signed)
Called pt and notfied him of his MRI appt, date and time

## 2015-11-10 ENCOUNTER — Ambulatory Visit (INDEPENDENT_AMBULATORY_CARE_PROVIDER_SITE_OTHER): Payer: PPO | Admitting: Family Medicine

## 2015-11-10 ENCOUNTER — Encounter: Payer: Self-pay | Admitting: Family Medicine

## 2015-11-10 VITALS — BP 122/76 | HR 67 | Temp 98.2°F | Resp 12 | Wt 171.4 lb

## 2015-11-10 DIAGNOSIS — F200 Paranoid schizophrenia: Secondary | ICD-10-CM | POA: Diagnosis not present

## 2015-11-10 DIAGNOSIS — K5909 Other constipation: Secondary | ICD-10-CM

## 2015-11-10 DIAGNOSIS — J449 Chronic obstructive pulmonary disease, unspecified: Secondary | ICD-10-CM | POA: Diagnosis not present

## 2015-11-10 DIAGNOSIS — M5417 Radiculopathy, lumbosacral region: Secondary | ICD-10-CM

## 2015-11-10 DIAGNOSIS — E1340 Other specified diabetes mellitus with diabetic neuropathy, unspecified: Secondary | ICD-10-CM

## 2015-11-10 DIAGNOSIS — K5900 Constipation, unspecified: Secondary | ICD-10-CM | POA: Diagnosis not present

## 2015-11-10 DIAGNOSIS — E785 Hyperlipidemia, unspecified: Secondary | ICD-10-CM | POA: Diagnosis not present

## 2015-11-10 DIAGNOSIS — M5416 Radiculopathy, lumbar region: Secondary | ICD-10-CM | POA: Diagnosis not present

## 2015-11-10 DIAGNOSIS — R600 Localized edema: Secondary | ICD-10-CM

## 2015-11-10 DIAGNOSIS — I739 Peripheral vascular disease, unspecified: Secondary | ICD-10-CM | POA: Diagnosis not present

## 2015-11-10 DIAGNOSIS — I10 Essential (primary) hypertension: Secondary | ICD-10-CM

## 2015-11-10 LAB — POCT GLYCOSYLATED HEMOGLOBIN (HGB A1C): HEMOGLOBIN A1C: 6.5

## 2015-11-10 MED ORDER — OXYCODONE-ACETAMINOPHEN 10-325 MG PO TABS: 1.0000 | ORAL_TABLET | ORAL | Status: DC | PRN

## 2015-11-10 MED ORDER — PRAVASTATIN SODIUM 40 MG PO TABS: 40.0000 mg | ORAL_TABLET | Freq: Every day | ORAL | Status: DC

## 2015-11-10 MED ORDER — LISINOPRIL 20 MG PO TABS: 20.0000 mg | ORAL_TABLET | Freq: Every day | ORAL | Status: DC

## 2015-11-10 NOTE — Progress Notes (Signed)
Name: Mark Nash   MRN: [id number]    DOB: 05/11/1956   Date:11/10/2015       Progress Note  Subjective  Chief Complaint  Chief Complaint  Patient presents with  . Diabetes  . Hypertension  . COPD  . Hyperlipidemia  . Constipation  . Claudication  . 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 He states since December  pain has been much more intense, and had several trips to [facility] . It seems to be triggered from coughing or picking something up that is a little heavier, also sleeping on his side. Each episode lasting weeks now instead of days and more intense. Current episode has been present for the past month, he was seen 10/29/2015 and was given  muscle relaxer and pain medication. Pain is now 5/10. States feels better when laying flat on his back.  the Pain is described as pressure like if he needs to have a bowel movement, and radiates to right lower inner thigh and sometimes down to his right foot, no loss of bowel movement or urinary incontinence. No saddle anesthesia. He states he had a X-ray that showed OA, but not available for review. I gave him prednisone back in January and symptoms resolved, but back since the end of February. He has to sleep flat on his back, at times difficulty bearing weight on right leg. He has a MRI scheduled for the end of this month.    DMII: he was advised to stop using Levemir, but he continues to take 5 units daily , hgbA1C is 6.5% - likely higher than before because of steroid taper, he denies polydipsia, polyuria or polyphagia, no recent episodes of hypoglycemia. He is on ace, and has neuropathy. He states his feet pain is described as burning or tingling, sometimes numb, pain is intermittent, but happens daily . We gave him Gabapentin but he did not like it and stopped medication    Lower extremity edema and history of claudication: symptoms of leg swelling since the end of January, improves when he walks around, worse when staying still at home, also has some calf discomfort when really swollen, some erythema but skin is intact.   COPD: he is not on medication, because he refuses, he had daily productive cough that lasts about 30 minutes in am. He has also noticed some SOB with activity, he is still smoking about 1 and half pack of cigarettes daily but not ready to quit smoking yet.   Hyperlipidemia: taking Pravastatin from [facility] again and is doing well, no side effects  HTN: taking bp medication and denies side effects,bp is at goal, no chest pain very seldom has  palpitation  Schizophrenia: denies any hallucinations, no depressed mood or suicidal thoughts or ideation, feeling well, not on medication and refuses to see Psychiatrist.   Hepatitis C antibody positive: he has a remote history of drug use from age 71 till 73. Used injectable drug use at the time, he has positive hepatitis C but refuses to see GI .  Chronic Constipation: he has intermittent periods of constipation. [place] of 1-2, usually dry and has to strain, has the feeling that is in his rectum but does not want to come off. He tried stools softeners but it did not work. Currently on Miralax daily and has bowel movements  daily.  No blood in stools.    Patient Active Problem List   Diagnosis Date Noted  . Hepatitis C antibody test positive 07/13/2015  . Chronic constipation 07/13/2015  . Abnormal immunological findings in specimens from other organs, systems and tissues 07/13/2015  . CN (constipation) 07/13/2015  . Benign hypertension 03/08/2015  . COPD, mild ([place]) 03/08/2015  . Dyslipidemia 03/08/2015  . Paranoid schizophrenia ([place]) 03/08/2015  . Acquired polyneuropathy ([place]) 03/08/2015  . Restless leg 03/08/2015  . Tobacco abuse 03/08/2015  . Callus of foot 03/08/2015  . Claudication ([place])  03/08/2015  . Chronic obstructive pulmonary disease ([place]) 03/08/2015  . Corn or callus 03/08/2015  . Essential (primary) hypertension 03/08/2015  . HLD (hyperlipidemia) 03/08/2015  . Peripheral vascular disease ([place]) 03/08/2015  . Polyneuropathy ([place]) 03/08/2015  . Current tobacco use 03/08/2015  . Vitamin D deficiency 04/26/2009  . Avitaminosis D 04/26/2009    Past Surgical History  Procedure Laterality Date  . Hernia repair      inguinal  . Sinusotomy      Family History  Problem Relation Age of Onset  . Hypertension Mother   . Diabetes Father   . Hypertension Father   . Heart disease Father   . Diverticulitis Brother     Social History   Social History  . Marital Status: Single    Spouse Name: N/A  . Number of Children: N/A  . Years of Education: N/A   Occupational History  . Not on file.   Social History Main Topics  . Smoking status: Current Every Day Smoker -- 1.50 packs/day for 45 years    Types: Cigarettes  . Smokeless tobacco: Never Used  . Alcohol Use: No  . Drug Use: No  . Sexual Activity: Not Currently   Other Topics Concern  . Not on file   Social History Narrative     Current outpatient prescriptions:  .  insulin detemir (LEVEMIR) 100 UNIT/ML injection, Inject 0.05 mLs (5 Units total) into the skin at bedtime., Disp: 15 mL, Rfl: 2 .  lisinopril (PRINIVIL,ZESTRIL) 20 MG tablet, Take 1 tablet (20 mg total) by mouth daily., Disp: 90 tablet, Rfl: 1 .  methocarbamol (ROBAXIN) 500 MG tablet, Take 1 tablet (500 mg total) by mouth every 8 (eight) hours as needed for muscle spasms., Disp: 90 tablet, Rfl: 0 .  oxyCODONE-acetaminophen (PERCOCET) 10-325 MG tablet, Take 1 tablet by mouth every 4 (four) hours as needed for pain., Disp: 30 tablet, Rfl: 0 .  polyethylene glycol powder (GLYCOLAX/MIRALAX) powder, Take 17 g by mouth 2 (two) times daily as needed., Disp: 3350 g, Rfl: 5 .  pravastatin (PRAVACHOL) 40 MG tablet, Take 1 tablet (40 mg total) by mouth  daily., Disp: 90 tablet, Rfl: 1 .  Vitamin D, Ergocalciferol, (DRISDOL) 50000 UNITS CAPS capsule, TAKE ONE CAPSULE BY MOUTH ONCE A WEEK OR BIWEEKLY AS DIRECTED, Disp: 4 capsule, Rfl: 6  Allergies  Allergen Reactions  . Cyclobenzaprine Swelling  . Aspirin     makes him feel weird  . Penicillins      ROS  Constitutional: Negative for fever , positive for weight change.  Respiratory: Negative for cough and shortness of breath.  No orthopnea, no PND Cardiovascular: Negative for chest pain , seldom has  Palpitations ( with hypoglycemic episodes ).  Gastrointestinal: Negative for abdominal pain, no bowel changes.  Musculoskeletal: Negative for gait problem or joint swelling.  Skin: Negative for rash.  Neurological: Negative for dizziness or headache.  No other specific complaints  in a complete review of systems (except as listed in HPI above).  Objective  Filed Vitals:   11/10/15 0915  BP: 122/76  Pulse: 67  Temp: 98.2 F (36.8 C)  TempSrc: Oral  Resp: 12  Weight: 171 lb 6.4 oz (77.747 kg)  SpO2: 98%    Body mass index is 23.92 kg/(m^2).  Physical Exam  Constitutional: Patient appears well-developed and well-nourished.  No distress.  HEENT: head atraumatic, normocephalic, pupils equal and reactive to light,  neck supple, throat within normal limits Cardiovascular: Normal rate, regular rhythm and normal heart sounds.  No murmur heard.  BLE edema 3 plus. Pulmonary/Chest: Effort normal and breath sounds normal. No respiratory distress. Abdominal: Soft.  There is no tenderness. Psychiatric: Patient has a normal mood and affect. behavior is normal. Judgment and thought content normal.  Recent Results (from the past 2160 hour(s))  POCT glycosylated hemoglobin (Hb A1C)     Status: Abnormal   Collection Time: 11/10/15  9:34 AM  Result Value Ref Range   Hemoglobin A1C 6.5      PHQ2/9: Depression screen [facility] 2/9 11/10/2015 09/16/2015 07/13/2015 03/11/2015  Decreased Interest 0 0  0 0  Down, Depressed, Hopeless 0 0 0 0  PHQ - 2 Score 0 0 0 0     Fall Risk: Fall Risk  11/10/2015 09/16/2015 07/13/2015 03/11/2015  Falls in the past year? No No No No      Functional Status Survey: Is the patient deaf or have difficulty hearing?: No Does the patient have difficulty seeing, even when wearing glasses/contacts?: No Does the patient have difficulty concentrating, remembering, or making decisions?: No Does the patient have difficulty walking or climbing stairs?: Yes (when he has flare ups) Does the patient have difficulty dressing or bathing?: No Does the patient have difficulty doing errands alone such as visiting a doctor's office or shopping?: No    Assessment & Plan  1. Secondary diabetes mellitus with diabetic neuropathy (HCC)  - POCT glycosylated hemoglobin (Hb A1C)  2. Radiculitis, lumbosacral  - oxyCODONE-acetaminophen (PERCOCET) 10-325 MG tablet; Take 1 tablet by mouth every 4 (four) hours as needed for pain.  Dispense: 30 tablet; Refill: 0  3. COPD, mild ([place])  Refuses medication   4. Benign hypertension  - lisinopril (PRINIVIL,ZESTRIL) 20 MG tablet; Take 1 tablet (20 mg total) by mouth daily.  Dispense: 90 tablet; Refill: 1 - CBC with Differential/Platelet - Comprehensive metabolic panel  5. Claudication [facility])  - Ambulatory referral to Vascular Surgery  6. Bilateral lower extremity edema  - Ambulatory referral to Vascular Surgery - CBC with Differential/Platelet - Comprehensive metabolic panel  7. Dyslipidemia  - pravastatin (PRAVACHOL) 40 MG tablet; Take 1 tablet (40 mg total) by mouth daily.  Dispense: 90 tablet; Refill: 1  8. Chronic constipation  Continue Miralax  9. Paranoid schizophrenia ([place])  stable

## 2015-11-11 ENCOUNTER — Encounter: Payer: Self-pay | Admitting: Family Medicine

## 2015-11-11 DIAGNOSIS — E46 Unspecified protein-calorie malnutrition: Secondary | ICD-10-CM | POA: Insufficient documentation

## 2015-11-11 DIAGNOSIS — D696 Thrombocytopenia, unspecified: Secondary | ICD-10-CM | POA: Insufficient documentation

## 2015-11-11 LAB — CBC WITH DIFFERENTIAL/PLATELET
BASOS: 0 %
Basophils Absolute: 0 10*3/uL (ref 0.0–0.2)
EOS (ABSOLUTE): 0.1 10*3/uL (ref 0.0–0.4)
Eos: 2 %
Hematocrit: 38.2 % (ref 37.5–51.0)
Hemoglobin: 13.5 g/dL (ref 12.6–17.7)
IMMATURE GRANS (ABS): 0 10*3/uL (ref 0.0–0.1)
Immature Granulocytes: 0 %
LYMPHS: 29 %
Lymphocytes Absolute: 1.8 10*3/uL (ref 0.7–3.1)
MCH: 32.1 pg (ref 26.6–33.0)
MCHC: 35.3 g/dL (ref 31.5–35.7)
MCV: 91 fL (ref 79–97)
MONOS ABS: 0.5 10*3/uL (ref 0.1–0.9)
Monocytes: 9 %
NEUTROS ABS: 3.7 10*3/uL (ref 1.4–7.0)
Neutrophils: 60 %
PLATELETS: 144 10*3/uL — AB (ref 150–379)
RBC: 4.2 x10E6/uL (ref 4.14–5.80)
RDW: 13.4 % (ref 12.3–15.4)
WBC: 6.1 10*3/uL (ref 3.4–10.8)

## 2015-11-11 LAB — COMPREHENSIVE METABOLIC PANEL
ALBUMIN: 4.3 g/dL (ref 3.6–4.8)
ALT: 20 IU/L (ref 0–44)
AST: 20 IU/L (ref 0–40)
Albumin/Globulin Ratio: 2.7 — ABNORMAL HIGH (ref 1.2–2.2)
Alkaline Phosphatase: 77 IU/L (ref 39–117)
BUN/Creatinine Ratio: 16 (ref 10–22)
BUN: 14 mg/dL (ref 8–27)
Bilirubin Total: 1 mg/dL (ref 0.0–1.2)
CHLORIDE: 101 mmol/L (ref 96–106)
CO2: 25 mmol/L (ref 18–29)
Calcium: 8.9 mg/dL (ref 8.6–10.2)
Creatinine, Ser: 0.88 mg/dL (ref 0.76–1.27)
GFR calc non Af Amer: 93 mL/min/{1.73_m2} (ref >59)
GFR, EST AFRICAN AMERICAN: 108 mL/min/{1.73_m2} (ref >59)
GLOBULIN, TOTAL: 1.6 g/dL (ref 1.5–4.5)
Glucose: 112 mg/dL — ABNORMAL HIGH (ref 65–99)
POTASSIUM: 4.1 mmol/L (ref 3.5–5.2)
Sodium: 142 mmol/L (ref 134–144)
Total Protein: 5.9 g/dL — ABNORMAL LOW (ref 6.0–8.5)

## 2015-11-16 DIAGNOSIS — M7989 Other specified soft tissue disorders: Secondary | ICD-10-CM | POA: Diagnosis not present

## 2015-11-16 DIAGNOSIS — E119 Type 2 diabetes mellitus without complications: Secondary | ICD-10-CM | POA: Diagnosis not present

## 2015-11-16 DIAGNOSIS — M79609 Pain in unspecified limb: Secondary | ICD-10-CM | POA: Diagnosis not present

## 2015-11-16 DIAGNOSIS — F17200 Nicotine dependence, unspecified, uncomplicated: Secondary | ICD-10-CM | POA: Diagnosis not present

## 2015-11-24 ENCOUNTER — Ambulatory Visit
Admission: RE | Admit: 2015-11-24 | Discharge: 2015-11-24 | Disposition: A | Discharge status: Home / Self Care | Payer: PPO | Source: Ambulatory Visit | Attending: Family Medicine | Admitting: Family Medicine

## 2015-11-24 ENCOUNTER — Other Ambulatory Visit: Payer: Self-pay | Admitting: Family Medicine

## 2015-11-24 DIAGNOSIS — M5126 Other intervertebral disc displacement, lumbar region: Secondary | ICD-10-CM | POA: Diagnosis not present

## 2015-11-24 DIAGNOSIS — M4690 Unspecified inflammatory spondylopathy, site unspecified: Secondary | ICD-10-CM | POA: Diagnosis not present

## 2015-11-24 DIAGNOSIS — G544 Lumbosacral root disorders, not elsewhere classified: Secondary | ICD-10-CM | POA: Insufficient documentation

## 2015-11-24 DIAGNOSIS — M5187 Other intervertebral disc disorders, lumbosacral region: Secondary | ICD-10-CM | POA: Insufficient documentation

## 2015-11-24 DIAGNOSIS — M5417 Radiculopathy, lumbosacral region: Secondary | ICD-10-CM

## 2015-11-24 DIAGNOSIS — M4806 Spinal stenosis, lumbar region: Secondary | ICD-10-CM | POA: Insufficient documentation

## 2015-11-24 DIAGNOSIS — M5127 Other intervertebral disc displacement, lumbosacral region: Secondary | ICD-10-CM | POA: Diagnosis not present

## 2015-11-24 DIAGNOSIS — M545 Low back pain: Secondary | ICD-10-CM | POA: Diagnosis not present

## 2015-11-24 DIAGNOSIS — M5416 Radiculopathy, lumbar region: Secondary | ICD-10-CM | POA: Diagnosis not present

## 2015-11-26 ENCOUNTER — Other Ambulatory Visit: Payer: Self-pay | Admitting: Family Medicine

## 2015-11-26 DIAGNOSIS — M5417 Radiculopathy, lumbosacral region: Secondary | ICD-10-CM

## 2015-11-26 NOTE — Telephone Encounter (Signed)
Requesting refill on Oxycodone, have enough to last through today.

## 2015-11-26 NOTE — Telephone Encounter (Signed)
Refill request was sent to Dr. Krichna Sowles for approval and submission.  

## 2015-11-29 ENCOUNTER — Telehealth: Payer: Self-pay | Admitting: Family Medicine

## 2015-11-29 NOTE — Telephone Encounter (Signed)
Patient stated that he is in a lot of pain and needs to know what to do. Percocet was last filled on 11/10/15. Patient stated that he does not like taking the percocet but b/c of the pain he has been taking it every 4-6 hours.  Patient was informed that i will talk with Dr. Ancil Boozer and will give him a call back.

## 2015-11-29 NOTE — Telephone Encounter (Signed)
I don't feel comfortable giving him percocet. I will refer him to pain clinic. I can give him Tramadol to take prn for pain until the visit

## 2015-11-29 NOTE — Telephone Encounter (Signed)
Pt states he ran out of his pain meds on Saturday 11/27/2015 and needs a refill.

## 2015-11-30 MED ORDER — TRAMADOL HCL 50 MG PO TABS: 50.0000 mg | ORAL_TABLET | Freq: Three times a day (TID) | ORAL | Status: DC

## 2015-11-30 NOTE — Telephone Encounter (Signed)
I will place referral and give rx of Tramadol, but please ask him why he did not get MRI of lumbar spine done?

## 2015-11-30 NOTE — Telephone Encounter (Signed)
Contacted this patient and relayed Dr. Ancil Boozer' message to him. He agreed to the referral and would like the rx for Tramadol. He stated that he was told the pain was coming from a herniated disc but it was never taken care of.

## 2015-12-07 DIAGNOSIS — M5127 Other intervertebral disc displacement, lumbosacral region: Secondary | ICD-10-CM | POA: Diagnosis not present

## 2015-12-21 ENCOUNTER — Encounter: Payer: Self-pay | Admitting: Family Medicine

## 2015-12-21 ENCOUNTER — Ambulatory Visit (INDEPENDENT_AMBULATORY_CARE_PROVIDER_SITE_OTHER): Payer: PPO | Admitting: Family Medicine

## 2015-12-21 VITALS — BP 132/70 | HR 64 | Temp 98.0°F | Resp 16 | Wt 163.7 lb

## 2015-12-21 DIAGNOSIS — R600 Localized edema: Secondary | ICD-10-CM | POA: Diagnosis not present

## 2015-12-21 DIAGNOSIS — J069 Acute upper respiratory infection, unspecified: Secondary | ICD-10-CM

## 2015-12-21 DIAGNOSIS — I10 Essential (primary) hypertension: Secondary | ICD-10-CM

## 2015-12-21 DIAGNOSIS — M5416 Radiculopathy, lumbar region: Secondary | ICD-10-CM | POA: Diagnosis not present

## 2015-12-21 DIAGNOSIS — M5417 Radiculopathy, lumbosacral region: Secondary | ICD-10-CM

## 2015-12-21 NOTE — Progress Notes (Signed)
Name: Mark Nash   MRN: [id number]    DOB: 1955-10-25   Date:12/21/2015       Progress Note  Subjective  Chief Complaint  Chief Complaint  Patient presents with  . radiculitis, lumbosacral  . intermittent low back pain    patient stated that he has seen a spinal surgeon in [place]. patient stated that he is doing better since starting new medication.  . purpura, nonthrombocytopenic  . URI    patient stated that he has a little cold.    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 He states since December pain has been much more intense, and had several trips to [facility] . It seems to be triggered from coughing or picking something up that is a little heavier, also sleeping on his side. Each episode lasting weeks now instead of days and more intense. Current episode has been present for the past month, he was seen 10/29/2015 and was given muscle relaxer and pain medication, seen by neurosurgeon and was started on Nabumetone about 2 weeks ago and pain is down to 4/10 and that is the average pain but with periods of exacerbation. States feels better when laying flat on his back. the Pain is described as pressure like if he needs to have a bowel movement, and radiates to right lower inner thigh and sometimes down to his right foot, no loss of bowel movement or urinary incontinence.  Lower extremity edema and history of claudication: symptoms of leg swelling since the end of January, improves when he walks around, worse when staying still at home, also has some calf discomfort when really swollen, some erythema but skin is intact. Over the past two weeks it has improved, has not seen vascular surgeon yet.   HTN: bp is elevated today, started on Nabumetone by neurosurgeon about 2 weeks ago. We will recheck it before he leaves, no chest pain or  palpitation  URI: started 5 days ago, with sinus pressure, sneezing, rhinorrhea, not much post-nasal drainage. Not taking anything otc. No fever. He states he is feeling better now. He states cough is stable.   Patient Active Problem List   Diagnosis Date Noted  . Lumbar herniated disc 11/24/2015  . Protein malnutrition ([place]) 11/11/2015  . Thrombocytopenia ([place]) 11/11/2015  . Hepatitis C antibody test positive 07/13/2015  . Chronic constipation 07/13/2015  . Abnormal immunological findings in specimens from other organs, systems and tissues 07/13/2015  . CN (constipation) 07/13/2015  . Benign hypertension 03/08/2015  . COPD, mild ([place]) 03/08/2015  . Dyslipidemia 03/08/2015  . Paranoid schizophrenia ([place]) 03/08/2015  . Acquired polyneuropathy ([place]) 03/08/2015  . Restless leg 03/08/2015  . Tobacco abuse 03/08/2015  . Callus of foot 03/08/2015  . Claudication ([place]) 03/08/2015  . Chronic obstructive pulmonary disease ([place]) 03/08/2015  . Corn or callus 03/08/2015  . Essential (primary) hypertension 03/08/2015  . HLD (hyperlipidemia) 03/08/2015  . Peripheral vascular disease ([place]) 03/08/2015  . Polyneuropathy ([place]) 03/08/2015  . Current tobacco use 03/08/2015  . Vitamin D deficiency 04/26/2009  . Avitaminosis D 04/26/2009    Past Surgical History  Procedure Laterality Date  . Hernia repair      inguinal  . Sinusotomy      Family History  Problem Relation Age of Onset  . Hypertension Mother   . Diabetes Father   . Hypertension  Father   . Heart disease Father   . Diverticulitis Brother     Social History   Social History  . Marital Status: Single    Spouse Name: N/A  . Number of Children: N/A  . Years of Education: N/A   Occupational History  . Not on file.   Social History Main Topics  . Smoking status: Current Every Day Smoker -- 1.50 packs/day for 45 years    Types: Cigarettes  . Smokeless tobacco: Never Used  . Alcohol Use: No  . Drug Use: No  . Sexual  Activity: Not Currently   Other Topics Concern  . Not on file   Social History Narrative     Current outpatient prescriptions:  .  insulin detemir (LEVEMIR) 100 UNIT/ML injection, Inject 0.05 mLs (5 Units total) into the skin at bedtime., Disp: 15 mL, Rfl: 2 .  lisinopril (PRINIVIL,ZESTRIL) 20 MG tablet, Take 1 tablet (20 mg total) by mouth daily., Disp: 90 tablet, Rfl: 1 .  methocarbamol (ROBAXIN) 500 MG tablet, Take 1 tablet (500 mg total) by mouth every 8 (eight) hours as needed for muscle spasms., Disp: 90 tablet, Rfl: 0 .  nabumetone (RELAFEN) 500 MG tablet, , Disp: , Rfl:  .  oxyCODONE-acetaminophen (PERCOCET) 10-325 MG tablet, , Disp: , Rfl:  .  polyethylene glycol powder (GLYCOLAX/MIRALAX) powder, Take 17 g by mouth 2 (two) times daily as needed., Disp: 3350 g, Rfl: 5 .  pravastatin (PRAVACHOL) 40 MG tablet, Take 1 tablet (40 mg total) by mouth daily., Disp: 90 tablet, Rfl: 1 .  traMADol (ULTRAM) 50 MG tablet, Take 1 tablet (50 mg total) by mouth 4 (four) times daily -  before meals and at bedtime., Disp: 90 tablet, Rfl: 0 .  Vitamin D, Ergocalciferol, (DRISDOL) 50000 UNITS CAPS capsule, TAKE ONE CAPSULE BY MOUTH ONCE A WEEK OR BIWEEKLY AS DIRECTED, Disp: 4 capsule, Rfl: 6  Allergies  Allergen Reactions  . Cyclobenzaprine Swelling  . Aspirin     makes him feel weird  . Penicillins      ROS  Ten systems reviewed and is negative except as mentioned in HPI  Lost 6 lbs since last visit, but it may have been fluid loss, not as swollen today. Feeling better overall.   Objective  Filed Vitals:   12/21/15 1352 12/21/15 1423  BP: 142/82 132/70  Pulse: 64   Temp: 98 F (36.7 C)   TempSrc: Oral   Resp: 16   Weight: 163 lb 11.2 oz (74.254 kg)   SpO2: 99%     Body mass index is 22.84 kg/(m^2).  Physical Exam  Constitutional: Patient appears well-developed and well-nourished.  No distress.  HEENT: head atraumatic, normocephalic, pupils equal and reactive to light, ears  TM normal,  neck supple, throat within normal limits Cardiovascular: Normal rate, regular rhythm and normal heart sounds.  No murmur heard. Trace BLE edema and erythema. Pulmonary/Chest: Effort normal and breath sounds normal. No respiratory distress. Abdominal: Soft.  There is no tenderness. Psychiatric: Patient has a normal mood and affect. behavior is normal. Judgment and thought content normal. Muscular Skeletal: pain during palpation of lumbar spine, negative straight leg raise  Recent Results (from the past 2160 hour(s))  POCT glycosylated hemoglobin (Hb A1C)     Status: Abnormal   Collection Time: 11/10/15  9:34 AM  Result Value Ref Range   Hemoglobin A1C 6.5   CBC with Differential/Platelet     Status: Abnormal   Collection Time: 11/10/15 10:45 AM  Result  Value Ref Range   WBC 6.1 3.4 - 10.8 x10E3/uL   RBC 4.20 4.14 - 5.80 x10E6/uL   Hemoglobin 13.5 12.6 - 17.7 g/dL   Hematocrit 38.2 37.5 - 51.0 %   MCV 91 79 - 97 fL   MCH 32.1 26.6 - 33.0 pg   MCHC 35.3 31.5 - 35.7 g/dL   RDW 13.4 12.3 - 15.4 %   Platelets 144 (L) 150 - 379 x10E3/uL   Neutrophils 60 %   Lymphs 29 %   Monocytes 9 %   Eos 2 %   Basos 0 %   Neutrophils Absolute 3.7 1.4 - 7.0 x10E3/uL   Lymphocytes Absolute 1.8 0.7 - 3.1 x10E3/uL   Monocytes Absolute 0.5 0.1 - 0.9 x10E3/uL   EOS (ABSOLUTE) 0.1 0.0 - 0.4 x10E3/uL   Basophils Absolute 0.0 0.0 - 0.2 x10E3/uL   Immature Granulocytes 0 %   Immature Grans (Abs) 0.0 0.0 - 0.1 x10E3/uL  Comprehensive metabolic panel     Status: Abnormal   Collection Time: 11/10/15 10:45 AM  Result Value Ref Range   Glucose 112 (H) 65 - 99 mg/dL   BUN 14 8 - 27 mg/dL   Creatinine, Ser 0.88 0.76 - 1.27 mg/dL   GFR calc non Af Amer 93 >59 mL/min/1.73   GFR calc Af Amer 108 >59 mL/min/1.73   BUN/Creatinine Ratio 16 10 - 22   Sodium 142 134 - 144 mmol/L   Potassium 4.1 3.5 - 5.2 mmol/L   Chloride 101 96 - 106 mmol/L   CO2 25 18 - 29 mmol/L   Calcium 8.9 8.6 - 10.2 mg/dL    Total Protein 5.9 (L) 6.0 - 8.5 g/dL   Albumin 4.3 3.6 - 4.8 g/dL   Globulin, Total 1.6 1.5 - 4.5 g/dL   Albumin/Globulin Ratio 2.7 (H) 1.2 - 2.2    Comment:               **Please note reference interval change**   Bilirubin Total 1.0 0.0 - 1.2 mg/dL   Alkaline Phosphatase 77 39 - 117 IU/L   AST 20 0 - 40 IU/L   ALT 20 0 - 44 IU/L     PHQ2/9: Depression screen [facility] 2/9 12/21/2015 11/10/2015 09/16/2015 07/13/2015 03/11/2015  Decreased Interest 0 0 0 0 0  Down, Depressed, Hopeless 0 0 0 0 0  PHQ - 2 Score 0 0 0 0 0    Fall Risk: Fall Risk  12/21/2015 11/10/2015 09/16/2015 07/13/2015 03/11/2015  Falls in the past year? No No No No No     Functional Status Survey: Is the patient deaf or have difficulty hearing?: No Does the patient have difficulty seeing, even when wearing glasses/contacts?: No Does the patient have difficulty concentrating, remembering, or making decisions?: No Does the patient have difficulty walking or climbing stairs?: Yes (when he has flare ups) Does the patient have difficulty dressing or bathing?: No Does the patient have difficulty doing errands alone such as visiting a doctor's office or shopping?: No    Assessment & Plan  1. Radiculitis, lumbosacral  Continue follow up with Dr. Jene Every and medication, monitor bp  2. Benign hypertension  Continue medication  3. Bilateral lower extremity edema  Keep follow up with vascular surgeon for further evaluation   4. Upper respiratory infection  Discussed otc Coricidin BPH and nasal saline

## 2015-12-27 DIAGNOSIS — M5127 Other intervertebral disc displacement, lumbosacral region: Secondary | ICD-10-CM | POA: Diagnosis not present

## 2016-01-13 ENCOUNTER — Other Ambulatory Visit: Payer: Self-pay | Admitting: Family Medicine

## 2016-01-13 NOTE — Telephone Encounter (Signed)
Patient requesting refill. 

## 2016-01-25 DIAGNOSIS — M5127 Other intervertebral disc displacement, lumbosacral region: Secondary | ICD-10-CM | POA: Diagnosis not present

## 2016-02-16 DIAGNOSIS — F17200 Nicotine dependence, unspecified, uncomplicated: Secondary | ICD-10-CM | POA: Diagnosis not present

## 2016-02-16 DIAGNOSIS — E119 Type 2 diabetes mellitus without complications: Secondary | ICD-10-CM | POA: Diagnosis not present

## 2016-02-16 DIAGNOSIS — M79609 Pain in unspecified limb: Secondary | ICD-10-CM | POA: Diagnosis not present

## 2016-02-16 DIAGNOSIS — M7989 Other specified soft tissue disorders: Secondary | ICD-10-CM | POA: Diagnosis not present

## 2016-02-24 DIAGNOSIS — M5127 Other intervertebral disc displacement, lumbosacral region: Secondary | ICD-10-CM | POA: Diagnosis not present

## 2016-02-24 DIAGNOSIS — M5417 Radiculopathy, lumbosacral region: Secondary | ICD-10-CM | POA: Diagnosis not present

## 2016-02-28 ENCOUNTER — Other Ambulatory Visit: Payer: Self-pay | Admitting: Family Medicine

## 2016-02-28 NOTE — Telephone Encounter (Signed)
Patient requesting refill. 

## 2016-03-21 ENCOUNTER — Ambulatory Visit (INDEPENDENT_AMBULATORY_CARE_PROVIDER_SITE_OTHER): Payer: PPO | Admitting: Family Medicine

## 2016-03-21 ENCOUNTER — Encounter: Payer: Self-pay | Admitting: Family Medicine

## 2016-03-21 VITALS — BP 116/70 | HR 68 | Temp 98.1°F | Resp 18 | Ht 71.0 in | Wt 150.1 lb

## 2016-03-21 DIAGNOSIS — M5416 Radiculopathy, lumbar region: Secondary | ICD-10-CM | POA: Diagnosis not present

## 2016-03-21 DIAGNOSIS — E785 Hyperlipidemia, unspecified: Secondary | ICD-10-CM | POA: Diagnosis not present

## 2016-03-21 DIAGNOSIS — F200 Paranoid schizophrenia: Secondary | ICD-10-CM | POA: Diagnosis not present

## 2016-03-21 DIAGNOSIS — M5417 Radiculopathy, lumbosacral region: Secondary | ICD-10-CM

## 2016-03-21 DIAGNOSIS — J449 Chronic obstructive pulmonary disease, unspecified: Secondary | ICD-10-CM | POA: Diagnosis not present

## 2016-03-21 DIAGNOSIS — E1140 Type 2 diabetes mellitus with diabetic neuropathy, unspecified: Secondary | ICD-10-CM

## 2016-03-21 DIAGNOSIS — I10 Essential (primary) hypertension: Secondary | ICD-10-CM

## 2016-03-21 DIAGNOSIS — I739 Peripheral vascular disease, unspecified: Secondary | ICD-10-CM

## 2016-03-21 LAB — GLUCOSE, POCT (MANUAL RESULT ENTRY): POC GLUCOSE: 116 mg/dL — AB (ref 70–99)

## 2016-03-21 LAB — POCT GLYCOSYLATED HEMOGLOBIN (HGB A1C): HEMOGLOBIN A1C: 6.8

## 2016-03-21 MED ORDER — LUBIPROSTONE 24 MCG PO CAPS: 24.0000 ug | ORAL_CAPSULE | Freq: Two times a day (BID) | ORAL | 5 refills | Status: DC

## 2016-03-21 MED ORDER — DIABETIC INSOLES MISC: 2.0000 | Freq: Every day | 1 refills | Status: DC

## 2016-03-21 MED ORDER — INSULIN DETEMIR 100 UNIT/ML ~~LOC~~ SOLN: 5.0000 [IU] | Freq: Every day | SUBCUTANEOUS | 2 refills | Status: DC

## 2016-03-21 NOTE — Progress Notes (Signed)
Name: Mark Nash   MRN: [id number]    DOB: 21-Mar-1956   Date:03/21/2016       Progress Note  Subjective  Chief Complaint  Chief Complaint  Patient presents with  . Diabetes    pt here for 3 month follow up  . Hypertension    HPI   DMII: he was advised to stop using Levemir, but he continues to take 5 units daily , hgbA1C is 6.7% - likely higher than before because of steroid taper, he denies polydipsia, polyuria or polyphagia, no recent episodes of hypoglycemia. He is on ace, and has neuropathy but is not on medication by choice.[name] He states his feet pain is described as burning or tingling, sometimes numb, pain is intermittent, but happens daily . We gave him Gabapentin but he did not like it and stopped medication   Lower extremity edema and history of claudication: symptoms of leg swelling since the end of January, seen by vascular surgeon, per patient studies negative and swelling has been down lately. He still has some intermittent claudication but seems to be from neuropathic pain.   COPD: he is not on medication, because he refuses, he had daily productive cough that lasts about 30 minutes in am. He has also noticed some SOB with activity, he is still smoking about 1 and half pack of cigarettes daily but not ready to quit smoking yet. He is currently trying to quit smoking, at one point he was smoking 5 cartons a month but down to 4 monthly now  Hyperlipidemia: taking Pravastatin from [facility] again and is doing well, no side effects  HTN: taking bp medication and denies side effects,bp is at goal, no chest pain or  palpitation  Schizophrenia: denies any hallucinations, no depressed mood or suicidal thoughts or ideation, feeling well, not on medication and refuses to see Psychiatrist.   Hepatitis C antibody positive: he has a remote history of drug use from age 60 till 61. Used injectable drug use at the time, he has positive hepatitis C but refuses to see GI .  Chronic  Constipation: he has intermittent periods of constipation. [name] is still 1-2, usually dry and has to strain, has the feeling that is in his rectum but does not want to come off. He tried stools softeners but it did not work. Currently on Miralax daily and has bowel movements daily, but never emptying.  He would to try a new medication  Chronic intermittent back pain: with lots of flares since Dec 2016 and trips to [facility]. He is now seeing pain clinic in [place] and getting steroid injections, pain is much better, at this time pain is 5/10 . Off pain medication, taking Robaxin prn only.   Patient Active Problem List   Diagnosis Date Noted  . Lumbar herniated disc 11/24/2015  . Protein malnutrition ([place]) 11/11/2015  . Thrombocytopenia ([place]) 11/11/2015  . Hepatitis C antibody test positive 07/13/2015  . Chronic constipation 07/13/2015  . Abnormal immunological findings in specimens from other organs, systems and tissues 07/13/2015  . CN (constipation) 07/13/2015  . Benign hypertension 03/08/2015  . COPD, mild ([place]) 03/08/2015  . Dyslipidemia 03/08/2015  . Paranoid schizophrenia ([place]) 03/08/2015  . Acquired polyneuropathy ([place]) 03/08/2015  . Restless leg 03/08/2015  . Tobacco abuse 03/08/2015  . Callus of foot 03/08/2015  . Claudication ([place]) 03/08/2015  . Chronic obstructive pulmonary disease ([place]) 03/08/2015  . Corn or callus 03/08/2015  . Essential (primary) hypertension 03/08/2015  . HLD (hyperlipidemia) 03/08/2015  .  Peripheral vascular disease ([place]) 03/08/2015  . Polyneuropathy ([place]) 03/08/2015  . Current tobacco use 03/08/2015  . Vitamin D deficiency 04/26/2009  . Avitaminosis D 04/26/2009    Past Surgical History:  Procedure Laterality Date  . HERNIA REPAIR     inguinal  . SINUSOTOMY      Family History  Problem Relation Age of Onset  . Hypertension Mother   . Diabetes Father   . Hypertension Father   . Heart disease Father   . Diverticulitis Brother     Social  History   Social History  . Marital status: Single    Spouse name: N/A  . Number of children: N/A  . Years of education: N/A   Occupational History  . Not on file.   Social History Main Topics  . Smoking status: Current Every Day Smoker    Packs/day: 1.50    Years: 45.00    Types: Cigarettes  . Smokeless tobacco: Never Used  . Alcohol use No  . Drug use: No  . Sexual activity: Not Currently   Other Topics Concern  . Not on file   Social History Narrative  . No narrative on file     Current Outpatient Prescriptions:  .  insulin detemir (LEVEMIR) 100 UNIT/ML injection, Inject 0.05 mLs (5 Units total) into the skin at bedtime., Disp: 15 mL, Rfl: 2 .  lisinopril (PRINIVIL,ZESTRIL) 20 MG tablet, Take 1 tablet (20 mg total) by mouth daily., Disp: 90 tablet, Rfl: 1 .  methocarbamol (ROBAXIN) 500 MG tablet, Take 1 tablet (500 mg total) by mouth every 8 (eight) hours as needed for muscle spasms., Disp: 90 tablet, Rfl: 0 .  polyethylene glycol powder (GLYCOLAX/MIRALAX) powder, Take 17 g by mouth 2 (two) times daily as needed., Disp: 3350 g, Rfl: 5 .  pravastatin (PRAVACHOL) 40 MG tablet, TAKE ONE TABLET BY MOUTH ONCE DAILY, Disp: 90 tablet, Rfl: 0 .  traMADol (ULTRAM) 50 MG tablet, TAKE ONE TABLET BY MOUTH 4 TIMES DAILY BEFORE MEAL(S) AND AT BEDTIME, Disp: 90 tablet, Rfl: 0 .  Vitamin D, Ergocalciferol, (DRISDOL) 50000 UNITS CAPS capsule, TAKE ONE CAPSULE BY MOUTH ONCE A WEEK OR BIWEEKLY AS DIRECTED, Disp: 4 capsule, Rfl: 6  Allergies  Allergen Reactions  . Cyclobenzaprine Swelling  . Aspirin     makes him feel weird  . Penicillins      ROS  Constitutional: Negative for fever, positive for  weight change - however his weight last year in Nov 2016 was 153 lbs, went up to 170's but he states when he feels good he eats better, and when he was in pain he was eating all the time and gained weight, back to baseline.  Respiratory: Positive for cough and shortness of breath.    Cardiovascular: Negative for chest pain or palpitations.  Gastrointestinal: Negative for abdominal pain, no bowel changes ( chronic constipation ).  Musculoskeletal: Negative for gait problem or joint swelling.  Skin: Negative for rash.  Neurological: Negative for dizziness or headache.  No other specific complaints in a complete review of systems (except as listed in HPI above).  Objective  Vitals:   03/21/16 1334  BP: 116/70  Pulse: 68  Resp: 18  Temp: 98.1 F (36.7 C)  SpO2: 98%  Weight: 150 lb 2 oz (68.1 kg)  Height: 5\' 11"  (1.803 m)    Body mass index is 20.94 kg/m.  Physical Exam  Constitutional: Patient appears well-developed and well-nourished.  No distress.  HEENT: head atraumatic, normocephalic, pupils equal  and reactive to light,neck supple, throat within normal limits Cardiovascular: Normal rate, regular rhythm and normal heart sounds.  No murmur heard. No BLE edema. Pulmonary/Chest: Effort normal and he has rhonchi. No respiratory distress. Abdominal: Soft.  There is no tenderness. Psychiatric: Patient has a normal mood and affect. behavior is normal. Judgment and thought content normal.  Recent Results (from the past 2160 hour(s))  POCT HgB A1C     Status: None   Collection Time: 03/21/16  1:43 PM  Result Value Ref Range   Hemoglobin A1C 6.8   POCT Glucose (CBG)     Status: Abnormal   Collection Time: 03/21/16  1:43 PM  Result Value Ref Range   POC Glucose 116 (A) 70 - 99 mg/dl    Diabetic Foot Exam: Diabetic Foot Exam - Simple   Simple Foot Form Diabetic Foot exam was performed with the following findings:  Yes 03/21/2016  1:52 PM  Visual Inspection See comments:  Yes Sensation Testing See comments:  Yes Pulse Check Posterior Tibialis and Dorsalis pulse intact bilaterally:  Yes Comments Lack of hair on both lower extremities, thin skin, but callus formation on both feet on great toe area ( fat pad ), ck nails,       PHQ2/9: Depression  screen [facility] 2/9 03/21/2016 12/21/2015 11/10/2015 09/16/2015 07/13/2015  Decreased Interest 0 0 0 0 0  Down, Depressed, Hopeless 0 0 0 0 0  PHQ - 2 Score 0 0 0 0 0  Altered sleeping 0 - - - -  Tired, decreased energy 0 - - - -  Change in appetite 0 - - - -  Feeling bad or failure about yourself  0 - - - -  Trouble concentrating 0 - - - -  Moving slowly or fidgety/restless 0 - - - -  Suicidal thoughts 0 - - - -  PHQ-9 Score 0 - - - -     Fall Risk: Fall Risk  12/21/2015 11/10/2015 09/16/2015 07/13/2015 03/11/2015  Falls in the past year? No No No No No      Functional Status Survey: Is the patient deaf or have difficulty hearing?: No Does the patient have difficulty seeing, even when wearing glasses/contacts?: No Does the patient have difficulty concentrating, remembering, or making decisions?: No Does the patient have difficulty walking or climbing stairs?: No Does the patient have difficulty dressing or bathing?: No Does the patient have difficulty doing errands alone such as visiting a doctor's office or shopping?: No    Assessment & Plan   1. Controlled type 2 diabetes mellitus with neuropathy (HCC)  At goal, compliant with diet, lost weight - back to baseline, hgbA1C is at goal . RX for diabetic insoles was given to the patient today - POCT HgB A1C - POCT Glucose (CBG) - insulin detemir (LEVEMIR) 100 UNIT/ML injection; Inject 0.05 mLs (5 Units total) into the skin at bedtime.  Dispense: 15 mL; Refill: 2  2. COPD, mild ([place])  He refuses medication, continue trying to quit smoking  3. Benign hypertension  Well controlled  4. Claudication ([place])  Likely from neuropathy  5. Paranoid schizophrenia ([place])  Stable   6. Dyslipidemia  Continue Pravastatin   7. Radiculitis, lumbosacral  Continue pain clinic visits

## 2016-03-30 DIAGNOSIS — M5417 Radiculopathy, lumbosacral region: Secondary | ICD-10-CM | POA: Diagnosis not present

## 2016-04-23 ENCOUNTER — Other Ambulatory Visit: Payer: Self-pay | Admitting: Family Medicine

## 2016-04-23 DIAGNOSIS — M545 Low back pain, unspecified: Secondary | ICD-10-CM

## 2016-04-27 DIAGNOSIS — M5417 Radiculopathy, lumbosacral region: Secondary | ICD-10-CM | POA: Diagnosis not present

## 2016-04-27 DIAGNOSIS — M5127 Other intervertebral disc displacement, lumbosacral region: Secondary | ICD-10-CM | POA: Diagnosis not present

## 2016-04-28 ENCOUNTER — Other Ambulatory Visit: Payer: Self-pay | Admitting: Family Medicine

## 2016-04-28 ENCOUNTER — Telehealth: Payer: Self-pay | Admitting: Family Medicine

## 2016-04-28 MED ORDER — TRAMADOL HCL 50 MG PO TABS: ORAL_TABLET | ORAL | 0 refills | Status: DC

## 2016-04-28 NOTE — Telephone Encounter (Signed)
done

## 2016-04-28 NOTE — Telephone Encounter (Signed)
Please advise 

## 2016-04-30 ENCOUNTER — Other Ambulatory Visit: Payer: Self-pay | Admitting: Family Medicine

## 2016-05-02 NOTE — Telephone Encounter (Signed)
Pt.notified

## 2016-05-12 ENCOUNTER — Encounter: Payer: Self-pay | Admitting: Pharmacist

## 2016-05-12 ENCOUNTER — Other Ambulatory Visit: Payer: Self-pay | Admitting: Pharmacist

## 2016-05-12 NOTE — Patient Outreach (Signed)
Outreach call to Mark Nash regarding his request for follow up from the [facility] Medication Adherence Campaign. HIPAA identifiers verified and verbal consent received.   Patient reports that he has been taking his pravastatin daily. However, reports that Dr. Ancil Boozer had instructed him to take  tablet of his pravastatin "a long time ago" and that he has been doing so ever since, eventhough the direction on his bottle states "Take one tablet by mouth once daily".   Denies any missed doses or any barriers to taking his medications such as cost or side effects. Per EPIC chart review, patient's last lipid panel was taken on 07/13/15 at which time his LDL was 41 mg/dL.   Patient reports that he has no medication questions or concerns at this time. Provide patient with my phone number.  As patient's dose is documented in EPIC that he is taking pravastatin 40 mg 1 tablet once daily, let patient know that I will follow up with his PCP just to confirm that she is aware that he is taking just the 1/2 tablet daily.  Harlow Asa, PharmD Clinical Pharmacist [facility] [phone]

## 2016-05-12 NOTE — Telephone Encounter (Signed)
This encounter was created in error - please disregard.

## 2016-05-15 ENCOUNTER — Other Ambulatory Visit: Payer: Self-pay | Admitting: Pharmacist

## 2016-05-15 ENCOUNTER — Telehealth: Payer: Self-pay

## 2016-05-15 NOTE — Patient Outreach (Signed)
Call to follow up with patient's PCP to confirm that she is aware that for his pravastatin 40 mg, he is taking just the 1/2 tablet daily rather than 1 full tablet once daily, as it is documented in EPIC and on the patient's bottle. Will let Dr. Ancil Boozer know that patient reports that she had instructed him to take  tablet of his pravastatin "a long time ago" and that he has been doing so ever since. Left message with Amber in the office.  Note per EPIC chart review, patient's last lipid panel was taken on 07/13/15 at which time his LDL was 41 mg/dL.   Harlow Asa, PharmD  Clinical Pharmacist  [facility]  [phone]

## 2016-05-15 NOTE — Telephone Encounter (Signed)
Pharmacy@ [facility]  called concern about his pravastatin dosage.  Pt stated to the pharmacist Dr Ancil Boozer  verbally stated to him that he can take half of pill( 20 mg) . Talk to Dr. Ancil Boozer and she stated that if he wanted to continue to take 1/2 a pill he can but she suggest that he takes the whole 40 mg. Told the pharmacist what Dr. Ancil Boozer stated, if the patient has any questions or concerns about the dosage suggest to the pharmacist to have the pt call us.  Bud Face, [place]

## 2016-05-17 DIAGNOSIS — M5417 Radiculopathy, lumbosacral region: Secondary | ICD-10-CM | POA: Diagnosis not present

## 2016-05-17 DIAGNOSIS — M5127 Other intervertebral disc displacement, lumbosacral region: Secondary | ICD-10-CM | POA: Diagnosis not present

## 2016-05-29 ENCOUNTER — Emergency Department
Admission: EM | Admit: 2016-05-29 | Discharge: 2016-05-29 | Disposition: A | Discharge status: Home / Self Care | Payer: PPO | Attending: Emergency Medicine | Admitting: Emergency Medicine

## 2016-05-29 ENCOUNTER — Encounter: Payer: Self-pay | Admitting: *Deleted

## 2016-05-29 ENCOUNTER — Emergency Department: Payer: PPO

## 2016-05-29 DIAGNOSIS — R600 Localized edema: Secondary | ICD-10-CM | POA: Diagnosis not present

## 2016-05-29 DIAGNOSIS — J449 Chronic obstructive pulmonary disease, unspecified: Secondary | ICD-10-CM | POA: Insufficient documentation

## 2016-05-29 DIAGNOSIS — Z794 Long term (current) use of insulin: Secondary | ICD-10-CM | POA: Diagnosis not present

## 2016-05-29 DIAGNOSIS — F17210 Nicotine dependence, cigarettes, uncomplicated: Secondary | ICD-10-CM | POA: Insufficient documentation

## 2016-05-29 DIAGNOSIS — R2243 Localized swelling, mass and lump, lower limb, bilateral: Secondary | ICD-10-CM | POA: Diagnosis not present

## 2016-05-29 DIAGNOSIS — E119 Type 2 diabetes mellitus without complications: Secondary | ICD-10-CM | POA: Insufficient documentation

## 2016-05-29 DIAGNOSIS — I10 Essential (primary) hypertension: Secondary | ICD-10-CM | POA: Diagnosis not present

## 2016-05-29 DIAGNOSIS — R609 Edema, unspecified: Secondary | ICD-10-CM

## 2016-05-29 DIAGNOSIS — Z79899 Other long term (current) drug therapy: Secondary | ICD-10-CM | POA: Diagnosis not present

## 2016-05-29 LAB — URINALYSIS COMPLETE WITH MICROSCOPIC (ARMC ONLY)
BACTERIA UA: NONE SEEN
BILIRUBIN URINE: NEGATIVE
Glucose, UA: NEGATIVE mg/dL
Ketones, ur: NEGATIVE mg/dL
LEUKOCYTES UA: NEGATIVE
NITRITE: NEGATIVE
PH: 6 (ref 5.0–8.0)
Protein, ur: NEGATIVE mg/dL
Specific Gravity, Urine: 1.009 (ref 1.005–1.030)
Squamous Epithelial / LPF: NONE SEEN

## 2016-05-29 LAB — BASIC METABOLIC PANEL
ANION GAP: 3 — AB (ref 5–15)
BUN: 13 mg/dL (ref 6–20)
CO2: 30 mmol/L (ref 22–32)
Calcium: 8.9 mg/dL (ref 8.9–10.3)
Chloride: 104 mmol/L (ref 101–111)
Creatinine, Ser: 0.9 mg/dL (ref 0.61–1.24)
GFR calc Af Amer: >60 mL/min (ref >60)
GFR calc non Af Amer: >60 mL/min (ref >60)
GLUCOSE: 178 mg/dL — AB (ref 65–99)
POTASSIUM: 4.3 mmol/L (ref 3.5–5.1)
Sodium: 137 mmol/L (ref 135–145)

## 2016-05-29 LAB — CBC
HEMATOCRIT: 40.7 % (ref 40.0–52.0)
HEMOGLOBIN: 13.8 g/dL (ref 13.0–18.0)
MCH: 32.7 pg (ref 26.0–34.0)
MCHC: 34 g/dL (ref 32.0–36.0)
MCV: 96.3 fL (ref 80.0–100.0)
Platelets: 142 10*3/uL — ABNORMAL LOW (ref 150–440)
RBC: 4.23 MIL/uL — ABNORMAL LOW (ref 4.40–5.90)
RDW: 14.5 % (ref 11.5–14.5)
WBC: 5.3 10*3/uL (ref 3.8–10.6)

## 2016-05-29 MED ORDER — FUROSEMIDE 20 MG PO TABS: 20.0000 mg | ORAL_TABLET | Freq: Every day | ORAL | 11 refills | Status: DC

## 2016-05-29 NOTE — ED Provider Notes (Signed)
Time Seen: Approximately2051 I have reviewed the triage notes  Chief Complaint: Leg Swelling   History of Present Illness: Mark Nash is a 60 y.o. male who presents with bilateral swelling of both lower extremities. Denies any calf tenderness. Denies any shortness of breath. Patient states he's had this before secondary to his hepatitis C and usually corrects with a low protein diet. He states he's had more extensive swelling in the past his main concern was that he has been trying to maintain his normal high protein diet and still developed swelling. He states the swelling is worse on the left leg than the right. He is states some mild discomfort due to the swelling. He has some known to low back pain and contact his neurosurgeon and was referred here to the emergency department for further evaluation. Patient recently started Neurontin for pain.   Past Medical History:  Diagnosis Date  . COPD (chronic obstructive pulmonary disease) ([place])   . Diabetes mellitus without complication ([place])   . Hepatitis C   . Hyperlipidemia   . Hypertension   . Schizoaffective disorder ([place])   . Vitamin D deficiency     Patient Active Problem List   Diagnosis Date Noted  . Lumbar herniated disc 11/24/2015  . Protein malnutrition ([place]) 11/11/2015  . Thrombocytopenia ([place]) 11/11/2015  . Hepatitis C antibody test positive 07/13/2015  . Chronic constipation 07/13/2015  . Abnormal immunological findings in specimens from other organs, systems and tissues 07/13/2015  . CN (constipation) 07/13/2015  . Benign hypertension 03/08/2015  . COPD, mild ([place]) 03/08/2015  . Dyslipidemia 03/08/2015  . Paranoid schizophrenia ([place]) 03/08/2015  . Acquired polyneuropathy ([place]) 03/08/2015  . Restless leg 03/08/2015  . Tobacco abuse 03/08/2015  . Callus of foot 03/08/2015  . Claudication ([place]) 03/08/2015  . Chronic obstructive pulmonary disease ([place]) 03/08/2015  . Corn or callus 03/08/2015  . Essential  (primary) hypertension 03/08/2015  . HLD (hyperlipidemia) 03/08/2015  . Peripheral vascular disease ([place]) 03/08/2015  . Polyneuropathy ([place]) 03/08/2015  . Current tobacco use 03/08/2015  . Vitamin D deficiency 04/26/2009  . Avitaminosis D 04/26/2009    Past Surgical History:  Procedure Laterality Date  . HERNIA REPAIR     inguinal  . SINUSOTOMY      Past Surgical History:  Procedure Laterality Date  . HERNIA REPAIR     inguinal  . SINUSOTOMY      Current Outpatient Rx  . Order #: [id number] Class: Print  . Order #: [id number] Class: Fax  . Order #: [id number] Class: Normal  . Order #: [id number] Class: Normal  . Order #: [id number] Class: Normal  . Order #: [id number] Class: Normal  . Order #: [id number] Class: Normal  . Order #: [id number] Class: Print  . Order #: [id number] Class: Normal    Allergies:  Cyclobenzaprine; Aspirin; and Penicillins  Family History: Family History  Problem Relation Age of Onset  . Hypertension Mother   . Diabetes Father   . Hypertension Father   . Heart disease Father   . Diverticulitis Brother     Social History: Social History  Substance Use Topics  . Smoking status: Current Every Day Smoker    Packs/day: 1.50    Years: 45.00    Types: Cigarettes  . Smokeless tobacco: Never Used  . Alcohol use No     Review of Systems:   10 point review of systems was performed and was otherwise negative:  Constitutional: No fever Eyes: No visual disturbances ENT: No sore throat, ear pain Cardiac: No chest pain Respiratory:  No shortness of breath, wheezing, or stridor Abdomen: No abdominal pain, no vomiting, No diarrhea Endocrine: No weight loss, No night sweats Extremities: No peripheral edema, cyanosis Skin: No rashes, easy bruising Neurologic: No focal weakness, trouble with speech or swollowing Urologic: No dysuria, Hematuria, or urinary frequency   Physical Exam:  ED Triage Vitals [05/29/16 1822]  Enc Vitals Group     BP 135/68      Pulse Rate (!) 58     Resp 18     Temp 97.8 F (36.6 C)     Temp Source Oral     SpO2 100 %     Weight 142 lb (64.4 kg)     Height 5\' 11"  (1.803 m)     Head Circumference      Peak Flow      Pain Score 6     Pain Loc      Pain Edu?      Excl. in [place]?     General: Awake , Alert , and Oriented times 3; GCS 15 Head: Normal cephalic , atraumatic Eyes: Pupils equal , round, reactive to light Nose/Throat: No nasal drainage, patent upper airway without erythema or exudate.  Neck: Supple, Full range of motion, No anterior adenopathy or palpable thyroid masses Lungs: Clear to ascultation without wheezes , rhonchi, or rales Heart: Regular rate, regular rhythm without murmurs , gallops , or rubs Abdomen: Soft, non tender without rebound, guarding , or rigidity; bowel sounds positive and symmetric in all 4 quadrants. No organomegaly .        Extremities: Mild nonpitting edema with some indications of chronic peripheral vascular disease with no here and a shiny appearance to both lower extremities. He does have pulses bilaterally. Swelling is worse than the left leg than the right with no calf tenderness. Negative Homans sign Neurologic: normal ambulation, Motor symmetric without deficits, sensory intact Skin: warm, dry, no rashes   Labs:   All laboratory work was reviewed including any pertinent negatives or positives listed below:  Labs Reviewed  BASIC METABOLIC PANEL - Abnormal; Notable for the following:       Result Value   Glucose, Bld 178 (*)    Anion gap 3 (*)    All other components within normal limits  CBC - Abnormal; Notable for the following:    RBC 4.23 (*)    Platelets 142 (*)    All other components within normal limits  URINALYSIS COMPLETEWITH MICROSCOPIC ([facility] ONLY) - Abnormal; Notable for the following:    Color, Urine YELLOW (*)    APPearance CLEAR (*)    Hgb urine dipstick 1+ (*)    All other components within normal limits  Patient doesn't have significant  protein in his urine  Radiology:  Patient is receiving a bilateral Doppler which I assume that will be negative for DVT and was done more as precautionary measure  I personally reviewed the radiologic studies    ED Course: * Patient has some mild peripheral edema which may be related to low protein secondary to chronic liver disease. Patient was prescribed diuretics which may or may not help his swelling which doesn't appear to be of significance at this time. He is advised take Motrin for pain. He should return here for fever, increased abdominal pain or any other new concerns and contact his primary physician for further follow-up of his peripheral vascular disease along with peripheral edema Clinical Course     Assessment:  Bilateral peripheral edema in the  lower extremities     Plan: * Outpatient Patient was advised to return immediately if condition worsens. Patient was advised to follow up with their primary care physician or other specialized physicians involved in their outpatient care. The patient and/or family member/power of attorney had laboratory results reviewed at the bedside. All questions and concerns were addressed and appropriate discharge instructions were distributed by the nursing staff.             Daymon Larsen, MD 05/29/16 [phone]

## 2016-05-29 NOTE — ED Triage Notes (Signed)
Pt reports bilateral swelling of feet and lower legs.  No sob. No chest pain.  Pt has a herniated disc and recently started on gabapentin.  Pt reports pain in both legs.

## 2016-05-29 NOTE — Discharge Instructions (Signed)
Please return immediately if condition worsens. Please contact her primary physician or the physician you were given for referral. If you have any specialist physicians involved in her treatment and plan please also contact them. Thank you for using [facility]. ° °

## 2016-05-31 ENCOUNTER — Other Ambulatory Visit: Payer: Self-pay | Admitting: Family Medicine

## 2016-05-31 DIAGNOSIS — M545 Low back pain, unspecified: Secondary | ICD-10-CM

## 2016-05-31 NOTE — Telephone Encounter (Signed)
Patient requesting refill of Methocarbamol to [facility].

## 2016-06-01 ENCOUNTER — Encounter: Payer: Self-pay | Admitting: Family Medicine

## 2016-06-01 ENCOUNTER — Ambulatory Visit (INDEPENDENT_AMBULATORY_CARE_PROVIDER_SITE_OTHER): Payer: PPO | Admitting: Family Medicine

## 2016-06-01 VITALS — BP 142/70 | HR 62 | Temp 98.1°F | Resp 16 | Ht 71.0 in | Wt 150.0 lb

## 2016-06-01 DIAGNOSIS — B182 Chronic viral hepatitis C: Secondary | ICD-10-CM | POA: Diagnosis not present

## 2016-06-01 DIAGNOSIS — R10812 Left upper quadrant abdominal tenderness: Secondary | ICD-10-CM

## 2016-06-01 DIAGNOSIS — R600 Localized edema: Secondary | ICD-10-CM

## 2016-06-01 MED ORDER — FUROSEMIDE 20 MG PO TABS
20.0000 mg | ORAL_TABLET | Freq: Two times a day (BID) | ORAL | 0 refills | Status: DC
Start: 2016-06-01 — End: 2016-06-01

## 2016-06-01 MED ORDER — FUROSEMIDE 20 MG PO TABS: 20.0000 mg | ORAL_TABLET | Freq: Two times a day (BID) | ORAL | 0 refills | Status: DC

## 2016-06-01 NOTE — Progress Notes (Signed)
Name: Mark Nash   MRN: [id number]    DOB: June 30, 1956   Date:06/01/2016       Progress Note  Subjective  Chief Complaint  Chief Complaint  Patient presents with  . Leg Swelling    feet and leg swelling for 7 days  This patient is usually followed by Dr. Ancil Boozer, new to me  HPI  Pt. Presents for worsening bilateral leg and feet swelling, started 5 days ago, started with cramping in his ankles. The swelling gradually gradually progressed and now up to his calves. He was seen 2 days ago at the ER and a DVT was ruled out. Pt. Believes the swelling is due to chronic liver disease 2/2 Hep C and 'low protein'. He was prescribed Furosemide 20 mg daily by the ER and he did not get it filled.   Past Medical History:  Diagnosis Date  . COPD (chronic obstructive pulmonary disease) ([place])   . Diabetes mellitus without complication ([place])   . Hepatitis C   . Hyperlipidemia   . Hypertension   . Schizoaffective disorder ([place])   . Vitamin D deficiency     Past Surgical History:  Procedure Laterality Date  . HERNIA REPAIR     inguinal  . SINUSOTOMY      Family History  Problem Relation Age of Onset  . Hypertension Mother   . Diabetes Father   . Hypertension Father   . Heart disease Father   . Diverticulitis Brother     Social History   Social History  . Marital status: Single    Spouse name: N/A  . Number of children: N/A  . Years of education: N/A   Occupational History  . Not on file.   Social History Main Topics  . Smoking status: Current Every Day Smoker    Packs/day: 1.50    Years: 45.00    Types: Cigarettes  . Smokeless tobacco: Never Used  . Alcohol use No  . Drug use: No  . Sexual activity: Not Currently   Other Topics Concern  . Not on file   Social History Narrative  . No narrative on file     Current Outpatient Prescriptions:  .  Foot Care Products (DIABETIC INSOLES) MISC, 2 each by Does not apply route daily., Disp: 2 each, Rfl: 1 .  furosemide  (LASIX) 20 MG tablet, Take 1 tablet (20 mg total) by mouth daily., Disp: 5 tablet, Rfl: 11 .  insulin detemir (LEVEMIR) 100 UNIT/ML injection, Inject 0.05 mLs (5 Units total) into the skin at bedtime., Disp: 15 mL, Rfl: 2 .  lisinopril (PRINIVIL,ZESTRIL) 20 MG tablet, Take 1 tablet (20 mg total) by mouth daily., Disp: 90 tablet, Rfl: 1 .  lubiprostone (AMITIZA) 24 MCG capsule, Take 1 capsule (24 mcg total) by mouth 2 (two) times daily with a meal., Disp: 60 capsule, Rfl: 5 .  methocarbamol (ROBAXIN) 500 MG tablet, TAKE ONE TABLET BY MOUTH EVERY 8 HOURS AS NEEDED FOR MUSCLE SPASM, Disp: 90 tablet, Rfl: 0 .  polyethylene glycol powder (GLYCOLAX/MIRALAX) powder, Take 17 g by mouth 2 (two) times daily as needed., Disp: 3350 g, Rfl: 5 .  pravastatin (PRAVACHOL) 40 MG tablet, TAKE ONE TABLET BY MOUTH ONCE DAILY, Disp: 90 tablet, Rfl: 0 .  traMADol (ULTRAM) 50 MG tablet, Three times daily, Disp: 90 tablet, Rfl: 0 .  Vitamin D, Ergocalciferol, (DRISDOL) 50000 units CAPS capsule, TAKE ONE CAPSULE BY MOUTH ONCE A WEEK OR  BIWEKLY  AS  DIRECTED, Disp: 4 capsule,  Rfl: 6 .  diclofenac (VOLTAREN) 75 MG EC tablet, , Disp: , Rfl:  .  gabapentin (NEURONTIN) 300 MG capsule, , Disp: , Rfl:   Allergies  Allergen Reactions  . Cyclobenzaprine Swelling  . Aspirin     makes him feel weird  . Penicillins      Review of Systems  Constitutional: Negative for malaise/fatigue.  Respiratory: Positive for cough (baseline, has hx of COPD). Negative for shortness of breath.   Cardiovascular: Positive for leg swelling. Negative for chest pain.  Gastrointestinal: Negative for abdominal pain, blood in stool, constipation, diarrhea, nausea and vomiting.    Objective  Vitals:   06/01/16 1337  BP: (!) 142/70  Pulse: 62  Resp: 16  Temp: 98.1 F (36.7 C)  TempSrc: Oral  SpO2: 98%  Weight: 150 lb (68 kg)  Height: 5\' 11"  (1.803 m)    Physical Exam  Constitutional: He is oriented to person, place, and time and  well-developed, well-nourished, and in no distress.  HENT:  Head: Normocephalic and atraumatic.  Mouth/Throat: Oropharynx is clear and moist. No posterior oropharyngeal erythema.  Neck: No JVD present.  Cardiovascular: Normal rate, regular rhythm, S1 normal, S2 normal and normal heart sounds.   No murmur heard. Pulmonary/Chest: Effort normal and breath sounds normal. He has no wheezes.  Abdominal: Soft. Bowel sounds are normal. He exhibits no shifting dullness and no fluid wave. There is no splenomegaly. There is tenderness in the left upper quadrant. There is no rigidity and no guarding.  Musculoskeletal:       Right ankle: He exhibits swelling. Tenderness.       Left ankle: He exhibits swelling. Tenderness.  2+ pitting edema with mild superficial erythema on left and right lower extremities.  Neurological: He is alert and oriented to person, place, and time.  Psychiatric: Mood, memory, affect and judgment normal.  Nursing note and vitals reviewed.    Assessment & Plan  1. Bilateral leg edema Has not started furosemide prescribed by the ER yet. We'll increase to 20 mg twice a day, DVT has been ruled out. - furosemide (LASIX) 20 MG tablet; Take 1 tablet (20 mg total) by mouth 2 (two) times daily.  Dispense: 60 tablet; Refill: 0  2. Chronic hepatitis C without hepatic coma ([place]) Obtain liver panel - Hepatic function panel  3. Left upper quadrant abdominal tenderness without rebound tenderness Obtain ultrasound of the abdomen for evaluation of left upper quadrant tenderness. - US Abdomen Complete; Future   Davan Hark [facility] 06/01/2016 1:50 PM

## 2016-06-02 ENCOUNTER — Ambulatory Visit
Admission: RE | Admit: 2016-06-02 | Discharge: 2016-06-02 | Disposition: A | Discharge status: Home / Self Care | Payer: PPO | Source: Ambulatory Visit | Attending: Family Medicine | Admitting: Family Medicine

## 2016-06-02 DIAGNOSIS — N281 Cyst of kidney, acquired: Secondary | ICD-10-CM | POA: Insufficient documentation

## 2016-06-02 DIAGNOSIS — R10812 Left upper quadrant abdominal tenderness: Secondary | ICD-10-CM | POA: Diagnosis not present

## 2016-06-02 DIAGNOSIS — R1012 Left upper quadrant pain: Secondary | ICD-10-CM | POA: Diagnosis not present

## 2016-06-02 LAB — HEPATIC FUNCTION PANEL
ALT: 18 U/L (ref 9–46)
AST: 18 U/L (ref 10–35)
Albumin: 4.2 g/dL (ref 3.6–5.1)
Alkaline Phosphatase: 56 U/L (ref 40–115)
BILIRUBIN DIRECT: 0.3 mg/dL — AB (ref <=0.2)
BILIRUBIN INDIRECT: 0.6 mg/dL (ref 0.2–1.2)
Total Bilirubin: 0.9 mg/dL (ref 0.2–1.2)
Total Protein: 5.9 g/dL — ABNORMAL LOW (ref 6.1–8.1)

## 2016-06-28 DIAGNOSIS — M5417 Radiculopathy, lumbosacral region: Secondary | ICD-10-CM | POA: Diagnosis not present

## 2016-06-30 ENCOUNTER — Ambulatory Visit (INDEPENDENT_AMBULATORY_CARE_PROVIDER_SITE_OTHER): Payer: PPO | Admitting: Family Medicine

## 2016-06-30 ENCOUNTER — Encounter: Payer: Self-pay | Admitting: Family Medicine

## 2016-06-30 VITALS — BP 136/74 | HR 61 | Temp 97.9°F | Resp 16 | Ht 71.0 in | Wt 153.1 lb

## 2016-06-30 DIAGNOSIS — R600 Localized edema: Secondary | ICD-10-CM | POA: Diagnosis not present

## 2016-06-30 DIAGNOSIS — I739 Peripheral vascular disease, unspecified: Secondary | ICD-10-CM

## 2016-06-30 DIAGNOSIS — Z23 Encounter for immunization: Secondary | ICD-10-CM

## 2016-06-30 LAB — BASIC METABOLIC PANEL
BUN: 19 mg/dL (ref 7–25)
CALCIUM: 8.9 mg/dL (ref 8.6–10.3)
CHLORIDE: 102 mmol/L (ref 98–110)
CO2: 28 mmol/L (ref 20–31)
Creat: 0.95 mg/dL (ref 0.70–1.25)
Glucose, Bld: 104 mg/dL — ABNORMAL HIGH (ref 65–99)
Potassium: 4.1 mmol/L (ref 3.5–5.3)
SODIUM: 140 mmol/L (ref 135–146)

## 2016-06-30 NOTE — Progress Notes (Signed)
Name: Mark Nash   MRN: [id number]    DOB: 1956/01/24   Date:06/30/2016       Progress Note  Subjective  Chief Complaint  Chief Complaint  Patient presents with  . Follow-up    1 month F/U Edema, would like to go over his labs while here  . Edema    Improving, took all the Lasix prescribed and still doesn't know why he is having the swelling.    HPI  Recurrent lower extremity edema: he was seen at [facility] 05/29/2016 with pain and bilateral lower extremity edema. He had previous episode earlier this year and was seen by vascular surgeon - however report from studies is not back in our chart. He states swelling was severe and legs were very tender, but has improved with the use of lasix twice daily. He denies associated orthopnea, or wheezing, no increase in SOB. Only new medication was gabapentin given by neurosurgeon for back pain, but is still taking it and swelling is going down. No chest pain or palpitation. He has DM and hyperlipidemia. We will contact vascular surgeon and see if he can go back sooner it does not seem to be cardiac. Recent labs reviewed and he has low protein level, needs to eat more protein. Flares with activity   Patient Active Problem List   Diagnosis Date Noted  . Hepatitis C 06/01/2016  . Lumbar herniated disc 11/24/2015  . Protein malnutrition ([place]) 11/11/2015  . Thrombocytopenia ([place]) 11/11/2015  . Hepatitis C antibody test positive 07/13/2015  . Chronic constipation 07/13/2015  . Abnormal immunological findings in specimens from other organs, systems and tissues 07/13/2015  . CN (constipation) 07/13/2015  . Benign hypertension 03/08/2015  . COPD, mild ([place]) 03/08/2015  . Dyslipidemia 03/08/2015  . Paranoid schizophrenia ([place]) 03/08/2015  . Acquired polyneuropathy ([place]) 03/08/2015  . Restless leg 03/08/2015  . Tobacco abuse 03/08/2015  . Callus of foot 03/08/2015  . Claudication ([place]) 03/08/2015  . Chronic obstructive pulmonary disease ([place]) 03/08/2015   . Corn or callus 03/08/2015  . Essential (primary) hypertension 03/08/2015  . HLD (hyperlipidemia) 03/08/2015  . Peripheral vascular disease ([place]) 03/08/2015  . Polyneuropathy ([place]) 03/08/2015  . Current tobacco use 03/08/2015  . Vitamin D deficiency 04/26/2009  . Avitaminosis D 04/26/2009    Past Surgical History:  Procedure Laterality Date  . HERNIA REPAIR     inguinal  . SINUSOTOMY      Family History  Problem Relation Age of Onset  . Hypertension Mother   . Diabetes Father   . Hypertension Father   . Heart disease Father   . Diverticulitis Brother     Social History   Social History  . Marital status: Single    Spouse name: N/A  . Number of children: N/A  . Years of education: N/A   Occupational History  . Not on file.   Social History Main Topics  . Smoking status: Current Every Day Smoker    Packs/day: 1.50    Years: 45.00    Types: Cigarettes  . Smokeless tobacco: Never Used  . Alcohol use No  . Drug use: No  . Sexual activity: Not Currently   Other Topics Concern  . Not on file   Social History Narrative  . No narrative on file     Current Outpatient Prescriptions:  .  Foot Care Products (DIABETIC INSOLES) MISC, 2 each by Does not apply route daily., Disp: 2 each, Rfl: 1 .  furosemide (LASIX) 20 MG tablet, Take  1 tablet (20 mg total) by mouth 2 (two) times daily., Disp: 60 tablet, Rfl: 0 .  gabapentin (NEURONTIN) 300 MG capsule, , Disp: , Rfl:  .  insulin detemir (LEVEMIR) 100 UNIT/ML injection, Inject 0.05 mLs (5 Units total) into the skin at bedtime., Disp: 15 mL, Rfl: 2 .  lisinopril (PRINIVIL,ZESTRIL) 20 MG tablet, Take 1 tablet (20 mg total) by mouth daily., Disp: 90 tablet, Rfl: 1 .  lubiprostone (AMITIZA) 24 MCG capsule, Take 1 capsule (24 mcg total) by mouth 2 (two) times daily with a meal., Disp: 60 capsule, Rfl: 5 .  methocarbamol (ROBAXIN) 500 MG tablet, TAKE ONE TABLET BY MOUTH EVERY 8 HOURS AS NEEDED FOR MUSCLE SPASM, Disp: 90  tablet, Rfl: 0 .  pravastatin (PRAVACHOL) 40 MG tablet, TAKE ONE TABLET BY MOUTH ONCE DAILY, Disp: 90 tablet, Rfl: 0 .  Vitamin D, Ergocalciferol, (DRISDOL) 50000 units CAPS capsule, TAKE ONE CAPSULE BY MOUTH ONCE A WEEK OR  BIWEKLY  AS  DIRECTED, Disp: 4 capsule, Rfl: 6  Allergies  Allergen Reactions  . Cyclobenzaprine Swelling  . Aspirin     makes him feel weird  . Penicillins      ROS  Constitutional: Negative for fever or weight change.  Respiratory: Positive  For intermittent for  cough and shortness of breath.   Cardiovascular: Negative for chest pain or palpitations.  Gastrointestinal: Negative for abdominal pain, no bowel changes.  Musculoskeletal: Negative for gait problem or joint swelling.  Skin: lower legs are red, but improving Neurological: Negative for dizziness or headache.  No other specific complaints in a complete review of systems (except as listed in HPI above).  Objective  Vitals:   06/30/16 1114  BP: 136/74  Pulse: 61  Resp: 16  Temp: 97.9 F (36.6 C)  TempSrc: Oral  SpO2: 98%  Weight: 153 lb 1.6 oz (69.4 kg)  Height: _0  (1.803 m)    Body mass index is 21.35 kg/m.  Physical Exam  Constitutional: Patient appears well-developed and thin. No distress.  HEENT: head atraumatic, normocephalic, pupils equal and reactive to light,  neck supple, throat within normal limits Cardiovascular: Normal rate, regular rhythm and normal heart sounds.  No murmur heard. 2 plus LE edema, leg erythema and mild tenderness. Pulmonary/Chest: Effort normal and breath sounds normal. No respiratory distress. Abdominal: Soft.  There is no tenderness. Psychiatric: Patient has a normal mood and affect. behavior is normal. Judgment and thought content normal.  Recent Results (from the past 2160 hour(s))  Basic metabolic panel     Status: Abnormal   Collection Time: 05/29/16  6:41 PM  Result Value Ref Range   Sodium 137 135 - 145 mmol/L   Potassium 4.3 3.5 - 5.1  mmol/L   Chloride 104 101 - 111 mmol/L   CO2 30 22 - 32 mmol/L   Glucose, Bld 178 (H) 65 - 99 mg/dL   BUN 13 6 - 20 mg/dL   Creatinine, Ser 0.90 0.61 - 1.24 mg/dL   Calcium 8.9 8.9 - 10.3 mg/dL   GFR calc non Af Amer >60 >60 mL/min   GFR calc Af Amer >60 >60 mL/min    Comment: (NOTE) The eGFR has been calculated using the CKD EPI equation. This calculation has not been validated in all clinical situations. eGFR's persistently <60 mL/min signify possible Chronic Kidney Disease.    Anion gap 3 (L) 5 - 15  CBC     Status: Abnormal   Collection Time: 05/29/16  6:41 PM  Result  Value Ref Range   WBC 5.3 3.8 - 10.6 K/uL   RBC 4.23 (L) 4.40 - 5.90 MIL/uL   Hemoglobin 13.8 13.0 - 18.0 g/dL   HCT 40.7 40.0 - 52.0 %   MCV 96.3 80.0 - 100.0 fL   MCH 32.7 26.0 - 34.0 pg   MCHC 34.0 32.0 - 36.0 g/dL   RDW 14.5 11.5 - 14.5 %   Platelets 142 (L) 150 - 440 K/uL  Urinalysis complete, with microscopic ([facility] only)     Status: Abnormal   Collection Time: 05/29/16  6:41 PM  Result Value Ref Range   Color, Urine YELLOW (A) YELLOW   APPearance CLEAR (A) CLEAR   Glucose, UA NEGATIVE NEGATIVE mg/dL   Bilirubin Urine NEGATIVE NEGATIVE   Ketones, ur NEGATIVE NEGATIVE mg/dL   Specific Gravity, Urine 1.009 1.005 - 1.030   Hgb urine dipstick 1+ (A) NEGATIVE   pH 6.0 5.0 - 8.0   Protein, ur NEGATIVE NEGATIVE mg/dL   Nitrite NEGATIVE NEGATIVE   Leukocytes, UA NEGATIVE NEGATIVE   RBC / HPF 0-5 0 - 5 RBC/hpf   WBC, UA 0-5 0 - 5 WBC/hpf   Bacteria, UA NONE SEEN NONE SEEN   Squamous Epithelial / LPF NONE SEEN NONE SEEN  Hepatic function panel     Status: Abnormal   Collection Time: 06/01/16  2:38 PM  Result Value Ref Range   Total Bilirubin 0.9 0.2 - 1.2 mg/dL   Bilirubin, Direct 0.3 (H) <=0.2 mg/dL   Indirect Bilirubin 0.6 0.2 - 1.2 mg/dL   Alkaline Phosphatase 56 40 - 115 U/L   AST 18 10 - 35 U/L   ALT 18 9 - 46 U/L   Total Protein 5.9 (L) 6.1 - 8.1 g/dL   Albumin 4.2 3.6 - 5.1 g/dL      PHQ2/9: Depression screen [facility] 2/9 06/30/2016 03/21/2016 12/21/2015 11/10/2015 09/16/2015  Decreased Interest 0 0 0 0 0  Down, Depressed, Hopeless 0 0 0 0 0  PHQ - 2 Score 0 0 0 0 0  Altered sleeping - 0 - - -  Tired, decreased energy - 0 - - -  Change in appetite - 0 - - -  Feeling bad or failure about yourself  - 0 - - -  Trouble concentrating - 0 - - -  Moving slowly or fidgety/restless - 0 - - -  Suicidal thoughts - 0 - - -  PHQ-9 Score - 0 - - -     Fall Risk: Fall Risk  06/30/2016 12/21/2015 11/10/2015 09/16/2015 07/13/2015  Falls in the past year? _0      Functional Status Survey: Is the patient deaf or have difficulty hearing?: No Does the patient have difficulty seeing, even when wearing glasses/contacts?: No Does the patient have difficulty concentrating, remembering, or making decisions?: No Does the patient have difficulty walking or climbing stairs?: No Does the patient have difficulty dressing or bathing?: No Does the patient have difficulty doing errands alone such as visiting a doctor's office or shopping?: No    Assessment & Plan  1. Bilateral leg edema  - Ambulatory referral to Vascular Surgery Elevate legs, try compression stocking hoses, decrease lasix to am's only and we will check labs and monitor potassium -Basic metabolic panel  2. Needs flu shot  - Flu vaccine HIGH DOSE PF  3. Claudication [facility])  - Ambulatory referral to Vascular Surgery

## 2016-07-03 ENCOUNTER — Other Ambulatory Visit: Payer: Self-pay

## 2016-07-03 MED ORDER — DICLOFENAC SODIUM 75 MG PO TBEC: 75.0000 mg | DELAYED_RELEASE_TABLET | Freq: Two times a day (BID) | ORAL | Status: DC

## 2016-07-03 NOTE — Progress Notes (Unsigned)
Given by Dr. Cyndy Freeze patient wanted to add to his chart

## 2016-07-05 ENCOUNTER — Other Ambulatory Visit: Payer: Self-pay | Admitting: Family Medicine

## 2016-07-05 DIAGNOSIS — M545 Low back pain, unspecified: Secondary | ICD-10-CM

## 2016-07-07 ENCOUNTER — Ambulatory Visit (INDEPENDENT_AMBULATORY_CARE_PROVIDER_SITE_OTHER): Payer: PPO | Admitting: Vascular Surgery

## 2016-07-07 ENCOUNTER — Encounter (INDEPENDENT_AMBULATORY_CARE_PROVIDER_SITE_OTHER): Payer: Self-pay | Admitting: Vascular Surgery

## 2016-07-07 VITALS — BP 137/75 | HR 56 | Resp 16 | Ht 71.0 in | Wt 162.0 lb

## 2016-07-07 DIAGNOSIS — I890 Lymphedema, not elsewhere classified: Secondary | ICD-10-CM | POA: Diagnosis not present

## 2016-07-07 DIAGNOSIS — Z720 Tobacco use: Secondary | ICD-10-CM | POA: Diagnosis not present

## 2016-07-07 DIAGNOSIS — I10 Essential (primary) hypertension: Secondary | ICD-10-CM

## 2016-07-07 NOTE — Progress Notes (Signed)
Subjective:    Patient ID: Mark Nash, male    DOB: Aug 20, 1956, 60 y.o.   MRN: [id number] Chief Complaint  Patient presents with  . Re-evaluation    Bilateral leg swelling   Patient presents at the request of Dr. Ancil Boozer for bilateral lower extremity swelling. Patient last seen on 02/16/16 and underwent bilateral ABI's which were normal and bilateral venous duplex which ruled out DVT, SVT and venous reflux. Patient choose at that time to follow up PRN. He was encouraged to wear compression stockings and elevate his legs which he has not done. Patient is on lasix which he says "helps a little". States swelling is intermittent. Denies any cellulitis, wounds or ulcer formation to his lower extremity.    Review of Systems  Constitutional: Negative.   HENT: Negative.   Eyes: Negative.   Respiratory: Negative.   Cardiovascular: Positive for leg swelling.  Gastrointestinal: Negative.   Endocrine: Negative.   Genitourinary: Negative.   Musculoskeletal: Negative.   Skin: Negative.   Allergic/Immunologic: Negative.   Neurological: Negative.   Hematological: Negative.   Psychiatric/Behavioral: Negative.       Objective:   Physical Exam  Constitutional: He is oriented to person, place, and time. He appears well-developed and well-nourished.  HENT:  Head: Normocephalic and atraumatic.  Eyes: Conjunctivae and EOM are normal.  Neck: Normal range of motion.  Cardiovascular: Normal rate, regular rhythm, normal heart sounds and intact distal pulses.   Pulses:      Radial pulses are 2+ on the right side, and 2+ on the left side.       Dorsalis pedis pulses are 2+ on the right side, and 2+ on the left side.       Posterior tibial pulses are 2+ on the right side, and 2+ on the left side.  Pulmonary/Chest: Effort normal and breath sounds normal.  Abdominal: Soft. Bowel sounds are normal.  Musculoskeletal: Normal range of motion. He exhibits edema (Moderate Bilateral Edema).  Neurological:  He is alert and oriented to person, place, and time.  Skin: Skin is warm and dry.  Psychiatric: He has a normal mood and affect. His behavior is normal. Judgment and thought content normal.   BP 137/75 (BP Location: Right Arm)   Pulse (!) 56   Resp 16   Ht 5\' 11"  (1.803 m)   Wt 162 lb (73.5 kg)   BMI 22.59 kg/m   Past Medical History:  Diagnosis Date  . COPD (chronic obstructive pulmonary disease) ([place])   . Diabetes mellitus without complication ([place])   . Hepatitis C   . Hyperlipidemia   . Hypertension   . Schizoaffective disorder ([place])   . Vitamin D deficiency    Social History   Social History  . Marital status: Single    Spouse name: N/A  . Number of children: N/A  . Years of education: N/A   Occupational History  . Not on file.   Social History Main Topics  . Smoking status: Current Every Day Smoker    Packs/day: 1.50    Years: 45.00    Types: Cigarettes  . Smokeless tobacco: Never Used  . Alcohol use No  . Drug use: No  . Sexual activity: Not Currently   Other Topics Concern  . Not on file   Social History Narrative  . No narrative on file   Past Surgical History:  Procedure Laterality Date  . HERNIA REPAIR     inguinal  . SINUSOTOMY  Family History  Problem Relation Age of Onset  . Hypertension Mother   . Diabetes Father   . Hypertension Father   . Heart disease Father   . Diverticulitis Brother    Allergies  Allergen Reactions  . Cyclobenzaprine Swelling  . Aspirin     makes him feel weird  . Penicillins       Assessment & Plan:  Patient presents at the request of Dr. Ancil Boozer for bilateral lower extremity swelling. Patient last seen on 02/16/16 and underwent bilateral ABI's which were normal and bilateral venous duplex which ruled out DVT, SVT and venous reflux. Patient choose at that time to follow up PRN. He was encouraged to wear compression stockings and elevate his legs which he has not done. Patient is on lasix which he says "helps  a little". States swelling is intermittent. Denies any cellulitis, wounds or ulcer formation to his lower extremity.   1. Lymphedema - Stable I spent time discussing his duplex results again, the difference between venous insufficiency vs lymphedema and why his swelling is not venous in origin. I discussed lymphedema and what symptoms it causes and how to manage them. The patient was encouraged to wear graduated compression stockings (20-30 mmHg) on a daily basis. The patient was instructed to begin wearing the stockings first thing in the morning and removing them in the evening. The patient was instructed specifically not to sleep in the stockings. Prescription given again. In addition, behavioral modification including elevation during the day will be initiated. We discussed a lymphedema pump if conventional therapy goes not work. The patient was advised to follow up in one month after wearing his compression stockings daily with elevation. Information on compression stockings, lymphedema and the lymphedema pump was given to the patient. The patient was instructed to call the office in the interim if any worsening edema or ulcerations to the legs, feet or toes occurs. The patient expresses their understanding.  2. Essential (primary) hypertension - Stable Encouraged good control as its slows the progression of atherosclerotic disease  3. Tobacco abuse - Stable I have discussed (approximately 5 minutes) with the patient the role of tobacco in the pathogenesis of atherosclerosis and its effect on the progression of the disease, impact on the durability of interventions and its limitations on the formation of collateral pathways. I have recommended absolute tobacco cessation. I have discussed various options available for assistance with tobacco cessation including over the counter methods (Nicotine gum, patch and lozenges). We also discussed prescription options (Chantix, Nicotine Inhaler / Nasal  Spray). The patient is not interested in pursuing any prescription tobacco cessation options at this time. The patient voices their understanding.   Current Outpatient Prescriptions on File Prior to Visit  Medication Sig Dispense Refill  . diclofenac (VOLTAREN) 75 MG EC tablet Take 1 tablet (75 mg total) by mouth 2 (two) times daily.    . Foot Care Products (DIABETIC INSOLES) MISC 2 each by Does not apply route daily. 2 each 1  . furosemide (LASIX) 20 MG tablet Take 1 tablet (20 mg total) by mouth 2 (two) times daily. 60 tablet 0  . gabapentin (NEURONTIN) 300 MG capsule     . insulin detemir (LEVEMIR) 100 UNIT/ML injection Inject 0.05 mLs (5 Units total) into the skin at bedtime. 15 mL 2  . lisinopril (PRINIVIL,ZESTRIL) 20 MG tablet Take 1 tablet (20 mg total) by mouth daily. 90 tablet 1  . lubiprostone (AMITIZA) 24 MCG capsule Take 1 capsule (24 mcg total) by  mouth 2 (two) times daily with a meal. 60 capsule 5  . methocarbamol (ROBAXIN) 500 MG tablet TAKE ONE TABLET BY MOUTH EVERY 8 HOURS AS NEEDED FOR MUSCLE SPASM 90 tablet 0  . pravastatin (PRAVACHOL) 40 MG tablet TAKE ONE TABLET BY MOUTH ONCE DAILY 90 tablet 0  . Vitamin D, Ergocalciferol, (DRISDOL) 50000 units CAPS capsule TAKE ONE CAPSULE BY MOUTH ONCE A WEEK OR  BIWEKLY  AS  DIRECTED 4 capsule 6   No current facility-administered medications on file prior to visit.     There are no Patient Instructions on file for this visit. Return in about 1 month (around 08/06/2016) for Lympedema Follow Up.   Bilbo Carcamo A Wissam Resor, PA-C

## 2016-07-08 ENCOUNTER — Encounter: Payer: Self-pay | Admitting: Family Medicine

## 2016-07-11 ENCOUNTER — Telehealth: Payer: Self-pay | Admitting: Family Medicine

## 2016-07-11 NOTE — Telephone Encounter (Signed)
Pt needs refill on Tramadol

## 2016-07-13 DIAGNOSIS — Z01812 Encounter for preprocedural laboratory examination: Secondary | ICD-10-CM | POA: Diagnosis not present

## 2016-07-13 DIAGNOSIS — M4727 Other spondylosis with radiculopathy, lumbosacral region: Secondary | ICD-10-CM | POA: Diagnosis not present

## 2016-07-13 DIAGNOSIS — M5417 Radiculopathy, lumbosacral region: Secondary | ICD-10-CM | POA: Diagnosis not present

## 2016-07-13 DIAGNOSIS — M545 Low back pain: Secondary | ICD-10-CM | POA: Diagnosis not present

## 2016-07-13 NOTE — Telephone Encounter (Signed)
For some reason it was discontinued on his last visit but patient states he does take it. Last refill was on 04/28/2016.

## 2016-07-13 NOTE — Telephone Encounter (Signed)
I don't see it on his active list. Can you call pharmacy and find out when it was filled and who prescribed?

## 2016-07-14 ENCOUNTER — Other Ambulatory Visit: Payer: Self-pay | Admitting: Family Medicine

## 2016-07-14 MED ORDER — TRAMADOL HCL 50 MG PO TABS: 50.0000 mg | ORAL_TABLET | Freq: Every day | ORAL | 0 refills | Status: DC | PRN

## 2016-07-14 NOTE — Telephone Encounter (Signed)
Done, for 30 pills

## 2016-07-14 NOTE — Telephone Encounter (Signed)
Patient notified

## 2016-07-17 DIAGNOSIS — M48061 Spinal stenosis, lumbar region without neurogenic claudication: Secondary | ICD-10-CM | POA: Diagnosis not present

## 2016-07-17 DIAGNOSIS — M4726 Other spondylosis with radiculopathy, lumbar region: Secondary | ICD-10-CM | POA: Diagnosis not present

## 2016-07-17 HISTORY — PX: OTHER SURGICAL HISTORY: SHX169

## 2016-07-24 ENCOUNTER — Ambulatory Visit: Payer: PPO | Admitting: Family Medicine

## 2016-08-15 ENCOUNTER — Ambulatory Visit: Payer: PPO | Admitting: Family Medicine

## 2016-08-15 ENCOUNTER — Ambulatory Visit (INDEPENDENT_AMBULATORY_CARE_PROVIDER_SITE_OTHER): Payer: PPO | Admitting: Family Medicine

## 2016-08-15 ENCOUNTER — Encounter: Payer: Self-pay | Admitting: Family Medicine

## 2016-08-15 VITALS — BP 138/86 | HR 73 | Temp 98.0°F | Resp 16 | Ht 71.0 in | Wt 161.2 lb

## 2016-08-15 DIAGNOSIS — R159 Full incontinence of feces: Secondary | ICD-10-CM | POA: Diagnosis not present

## 2016-08-15 DIAGNOSIS — D692 Other nonthrombocytopenic purpura: Secondary | ICD-10-CM | POA: Diagnosis not present

## 2016-08-15 DIAGNOSIS — K5909 Other constipation: Secondary | ICD-10-CM

## 2016-08-15 DIAGNOSIS — E559 Vitamin D deficiency, unspecified: Secondary | ICD-10-CM

## 2016-08-15 DIAGNOSIS — J449 Chronic obstructive pulmonary disease, unspecified: Secondary | ICD-10-CM

## 2016-08-15 DIAGNOSIS — I10 Essential (primary) hypertension: Secondary | ICD-10-CM | POA: Diagnosis not present

## 2016-08-15 DIAGNOSIS — E785 Hyperlipidemia, unspecified: Secondary | ICD-10-CM

## 2016-08-15 DIAGNOSIS — Z9889 Other specified postprocedural states: Secondary | ICD-10-CM

## 2016-08-15 DIAGNOSIS — E1140 Type 2 diabetes mellitus with diabetic neuropathy, unspecified: Secondary | ICD-10-CM | POA: Diagnosis not present

## 2016-08-15 LAB — POCT GLYCOSYLATED HEMOGLOBIN (HGB A1C): HEMOGLOBIN A1C: 6

## 2016-08-15 MED ORDER — PRAVASTATIN SODIUM 40 MG PO TABS: 40.0000 mg | ORAL_TABLET | Freq: Every day | ORAL | 1 refills | Status: DC

## 2016-08-15 MED ORDER — LISINOPRIL 20 MG PO TABS: 20.0000 mg | ORAL_TABLET | Freq: Every day | ORAL | 1 refills | Status: DC

## 2016-08-15 NOTE — Progress Notes (Signed)
Name: Mark Nash   MRN: [id number]    DOB: May 30, 1956   Date:08/15/2016       Progress Note  Subjective  Chief Complaint  Chief Complaint  Patient presents with  . Medication Refill    4 month F/U  . Hypertension    Headaches  . Diabetes    Patient does not check sugar due to controlling DM with diet  . COPD  . Schizophrenia  . Hyperlipidemia    HPI  DMII: he was advised to stop using Levemir, but he continues to take 5 units daily , hgbA1C was 6.8% but is down to 6 % now.  He denies polydipsia, polyuria or polyphagia, no recent episodes of hypoglycemia. He is on ace, and has neuropathy, he is on gabapentin. He states his feet pain is described as burning or tingling, sometimes numb, pain improved with back surgery, but still present on right foot.   Lower extremity edema and history of claudication: symptoms of leg swelling since the end of January, seen by vascular surgeon, per patient studies negative and swelling has been down lately. He still has some intermittent claudication but seems to be from neuropathic pain. He is off lasix  COPD: he is not on medication, because he refuses, he had daily productive cough that lasts about 30 minutes in am. He has also noticed some SOB with activity, he is still smoking about 1 and half pack of cigarettes daily but not ready to quit smoking yet.   Hyperlipidemia: taking Pravastatin, only half pill at night, we will recheck labs. No myalgias   HTN: taking bp medication and denies side effects,bp is at goal, no chest pain or palpitation.  Schizophrenia: denies any hallucinations, no depressed mood or suicidal thoughts or ideation, feeling well, not on medication and refuses to see Psychiatrist.  No changes  Hepatitis C antibody positive: he has a remote history of drug use from age 89 till 75. Used injectable drug use at the time, he has positive hepatitis C but refuses to see GI .  Chronic Constipation: he has intermittent  periods of constipation. [name] is still 1-2, usually dry and has to strain, has the feeling that is in his rectum but does not want to come off. He has taking Amitiza 24 mcg twice daily since July and has been having daily bowel movements, but over the past two months he has woken up about 5 times with stools on his under ware. He had bowel movements during the night. He states skipping doses improves symptoms. He does not have problems during the day. He denies saddle anesthesia, symptoms started before back surgery.   Chronic intermittent back pain: with lots of flares since Dec 2016 and trips to [facility]. He went to see Dr. Cyndy Freeze and had surgery 07/17/2016 , he is still taking hydrocodone for pain. He states that radiculitis has improved, occasionally has right foot numbness. He did not discuss bowel incontinence with neurosurgeon. He will decrease Amitiza intake and if symptoms persists advised to contact Dr. Cyndy Freeze.   Patient Active Problem List   Diagnosis Date Noted  . Lymphedema 07/07/2016  . Hepatitis C 06/01/2016  . Lumbar herniated disc 11/24/2015  . Protein malnutrition ([place]) 11/11/2015  . Thrombocytopenia ([place]) 11/11/2015  . Hepatitis C antibody test positive 07/13/2015  . Chronic constipation 07/13/2015  . Abnormal immunological findings in specimens from other organs, systems and tissues 07/13/2015  . CN (constipation) 07/13/2015  . Benign hypertension 03/08/2015  . COPD, mild ([place])  03/08/2015  . Dyslipidemia 03/08/2015  . Paranoid schizophrenia ([place]) 03/08/2015  . Acquired polyneuropathy ([place]) 03/08/2015  . Restless leg 03/08/2015  . Tobacco abuse 03/08/2015  . Callus of foot 03/08/2015  . Claudication ([place]) 03/08/2015  . Chronic obstructive pulmonary disease ([place]) 03/08/2015  . Corn or callus 03/08/2015  . Essential (primary) hypertension 03/08/2015  . HLD (hyperlipidemia) 03/08/2015  . Peripheral vascular disease ([place]) 03/08/2015  . Polyneuropathy ([place]) 03/08/2015  .  Current tobacco use 03/08/2015  . Vitamin D deficiency 04/26/2009  . Avitaminosis D 04/26/2009    Past Surgical History:  Procedure Laterality Date  . HERNIA REPAIR     inguinal  . SINUSOTOMY    . SPINE SURGERY  07/17/2016   [facility]    Family History  Problem Relation Age of Onset  . Hypertension Mother   . Diabetes Father   . Hypertension Father   . Heart disease Father   . Diverticulitis Brother     Social History   Social History  . Marital status: Single    Spouse name: N/A  . Number of children: N/A  . Years of education: N/A   Occupational History  . Not on file.   Social History Main Topics  . Smoking status: Current Every Day Smoker    Packs/day: 1.50    Years: 45.00    Types: Cigarettes  . Smokeless tobacco: Never Used  . Alcohol use No  . Drug use: No  . Sexual activity: Not Currently   Other Topics Concern  . Not on file   Social History Narrative  . No narrative on file     Current Outpatient Prescriptions:  .  diclofenac (VOLTAREN) 75 MG EC tablet, Take 1 tablet (75 mg total) by mouth 2 (two) times daily., Disp: , Rfl:  .  Foot Care Products (DIABETIC INSOLES) MISC, 2 each by Does not apply route daily., Disp: 2 each, Rfl: 1 .  gabapentin (NEURONTIN) 300 MG capsule, Take 1 capsule by mouth 3 (three) times daily., Disp: , Rfl:  .  HYDROcodone-acetaminophen (NORCO) 7.5-325 MG tablet, Take 1 tablet by mouth every 6 (six) hours., Disp: , Rfl:  .  insulin detemir (LEVEMIR) 100 UNIT/ML injection, Inject 0.05 mLs (5 Units total) into the skin at bedtime., Disp: 15 mL, Rfl: 2 .  lisinopril (PRINIVIL,ZESTRIL) 20 MG tablet, Take 1 tablet (20 mg total) by mouth daily., Disp: 90 tablet, Rfl: 1 .  lubiprostone (AMITIZA) 24 MCG capsule, Take 1 capsule (24 mcg total) by mouth 2 (two) times daily with a meal., Disp: 60 capsule, Rfl: 5 .  methocarbamol (ROBAXIN) 750 MG tablet, , Disp: , Rfl:  .  pravastatin (PRAVACHOL) 40 MG  tablet, Take 1 tablet (40 mg total) by mouth daily., Disp: 90 tablet, Rfl: 1 .  Vitamin D, Ergocalciferol, (DRISDOL) 50000 units CAPS capsule, TAKE ONE CAPSULE BY MOUTH ONCE A WEEK OR  BIWEKLY  AS  DIRECTED, Disp: 4 capsule, Rfl: 6  Allergies  Allergen Reactions  . Cyclobenzaprine Swelling  . Aspirin     makes him feel weird  . Penicillins      ROS  Constitutional: Negative for fever or weight change.  Respiratory: Positive  for cough and shortness of breath.   Cardiovascular: Negative for chest pain or palpitations.  Gastrointestinal: Negative for abdominal pain, no bowel changes.  Musculoskeletal: Negative for gait problem or joint swelling.  Skin: Negative for rash.  Neurological: Negative for dizziness, positive  headache.  No other specific  complaints in a complete review of systems (except as listed in HPI above).  Objective  Vitals:   08/15/16 1332  BP: 138/86  Pulse: 73  Resp: 16  Temp: 98 F (36.7 C)  TempSrc: Oral  SpO2: 98%  Weight: 161 lb 3.2 oz (73.1 kg)  Height: '5\' 11"'  (1.803 m)    Body mass index is 22.48 kg/m.  Physical Exam  Constitutional: Patient appears well-developed and well-nourished.  No distress.  HEENT: head atraumatic, normocephalic, pupils equal and reactive to light, neck supple, throat within normal limits Cardiovascular: Normal rate, regular rhythm and normal heart sounds.  No murmur heard. 1 plus  BLE edema. Lack of lower extremity hair and has erythema lower extremity Pulmonary/Chest: Effort normal and breath sounds normal. No respiratory distress. Abdominal: Soft.  There is no tenderness. Psychiatric: Patient has a normal mood and affect. behavior is normal. Judgment and thought content normal. Muscular Skeletal: scar on lumbar spine is well healed, negative straight leg raise    Recent Results (from the past 2160 hour(s))  Basic metabolic panel     Status: Abnormal   Collection Time: 05/29/16  6:41 PM  Result Value Ref Range    Sodium 137 135 - 145 mmol/L   Potassium 4.3 3.5 - 5.1 mmol/L   Chloride 104 101 - 111 mmol/L   CO2 30 22 - 32 mmol/L   Glucose, Bld 178 (H) 65 - 99 mg/dL   BUN 13 6 - 20 mg/dL   Creatinine, Ser 0.90 0.61 - 1.24 mg/dL   Calcium 8.9 8.9 - 10.3 mg/dL   GFR calc non Af Amer >60 >60 mL/min   GFR calc Af Amer >60 >60 mL/min    Comment: (NOTE) The eGFR has been calculated using the CKD EPI equation. This calculation has not been validated in all clinical situations. eGFR's persistently <60 mL/min signify possible Chronic Kidney Disease.    Anion gap 3 (L) 5 - 15  CBC     Status: Abnormal   Collection Time: 05/29/16  6:41 PM  Result Value Ref Range   WBC 5.3 3.8 - 10.6 K/uL   RBC 4.23 (L) 4.40 - 5.90 MIL/uL   Hemoglobin 13.8 13.0 - 18.0 g/dL   HCT 40.7 40.0 - 52.0 %   MCV 96.3 80.0 - 100.0 fL   MCH 32.7 26.0 - 34.0 pg   MCHC 34.0 32.0 - 36.0 g/dL   RDW 14.5 11.5 - 14.5 %   Platelets 142 (L) 150 - 440 K/uL  Urinalysis complete, with microscopic ([facility] only)     Status: Abnormal   Collection Time: 05/29/16  6:41 PM  Result Value Ref Range   Color, Urine YELLOW (A) YELLOW   APPearance CLEAR (A) CLEAR   Glucose, UA NEGATIVE NEGATIVE mg/dL   Bilirubin Urine NEGATIVE NEGATIVE   Ketones, ur NEGATIVE NEGATIVE mg/dL   Specific Gravity, Urine 1.009 1.005 - 1.030   Hgb urine dipstick 1+ (A) NEGATIVE   pH 6.0 5.0 - 8.0   Protein, ur NEGATIVE NEGATIVE mg/dL   Nitrite NEGATIVE NEGATIVE   Leukocytes, UA NEGATIVE NEGATIVE   RBC / HPF 0-5 0 - 5 RBC/hpf   WBC, UA 0-5 0 - 5 WBC/hpf   Bacteria, UA NONE SEEN NONE SEEN   Squamous Epithelial / LPF NONE SEEN NONE SEEN  Hepatic function panel     Status: Abnormal   Collection Time: 06/01/16  2:38 PM  Result Value Ref Range   Total Bilirubin 0.9 0.2 - 1.2 mg/dL  Bilirubin, Direct 0.3 (H) <=0.2 mg/dL   Indirect Bilirubin 0.6 0.2 - 1.2 mg/dL   Alkaline Phosphatase 56 40 - 115 U/L   AST 18 10 - 35 U/L   ALT 18 9 - 46 U/L   Total Protein 5.9 (L)  6.1 - 8.1 g/dL   Albumin 4.2 3.6 - 5.1 g/dL  Basic metabolic panel     Status: Abnormal   Collection Time: 06/30/16 12:00 PM  Result Value Ref Range   Sodium 140 135 - 146 mmol/L   Potassium 4.1 3.5 - 5.3 mmol/L   Chloride 102 98 - 110 mmol/L   CO2 28 20 - 31 mmol/L   Glucose, Bld 104 (H) 65 - 99 mg/dL   BUN 19 7 - 25 mg/dL   Creat 0.95 0.70 - 1.25 mg/dL    Comment:   For patients > or = 61 years of age: The upper reference limit for Creatinine is approximately 13% higher for people identified as African-American.      Calcium 8.9 8.6 - 10.3 mg/dL  POCT HgB A1C     Status: Normal   Collection Time: 08/15/16  1:34 PM  Result Value Ref Range   Hemoglobin A1C 6.0      PHQ2/9: Depression screen [facility] 2/9 06/30/2016 03/21/2016 12/21/2015 11/10/2015 09/16/2015  Decreased Interest 0 0 0 0 0  Down, Depressed, Hopeless 0 0 0 0 0  PHQ - 2 Score 0 0 0 0 0  Altered sleeping - 0 - - -  Tired, decreased energy - 0 - - -  Change in appetite - 0 - - -  Feeling bad or failure about yourself  - 0 - - -  Trouble concentrating - 0 - - -  Moving slowly or fidgety/restless - 0 - - -  Suicidal thoughts - 0 - - -  PHQ-9 Score - 0 - - -     Fall Risk: Fall Risk  06/30/2016 12/21/2015 11/10/2015 09/16/2015 07/13/2015  Falls in the past year? No No No No No      Assessment & Plan  1. Controlled type 2 diabetes with neuropathy (HCC)  - POCT HgB A1C  2. COPD, mild ([place])  Refused medication   3. Benign hypertension  - lisinopril (PRINIVIL,ZESTRIL) 20 MG tablet; Take 1 tablet (20 mg total) by mouth daily.  Dispense: 90 tablet; Refill: 1  4. Dyslipidemia  - pravastatin (PRAVACHOL) 40 MG tablet; Take 1 tablet (40 mg total) by mouth daily.  Dispense: 90 tablet; Refill: 1 - Lipid panel  5. Purpura, nonthrombocytopenic (HCC)  stable  6. History of lumbar surgery  Doing well   7. Chronic constipation  Back down on Amitiza to once daily and call back if symptoms do not resolve  8. Full  incontinence of feces  See above  9. Avitaminosis D  - COMPLETE METABOLIC PANEL WITH GFR

## 2016-08-16 ENCOUNTER — Ambulatory Visit (INDEPENDENT_AMBULATORY_CARE_PROVIDER_SITE_OTHER): Payer: PPO | Admitting: Vascular Surgery

## 2016-08-18 ENCOUNTER — Encounter: Payer: Self-pay | Admitting: Family Medicine

## 2016-08-21 ENCOUNTER — Emergency Department (HOSPITAL_COMMUNITY)
Admission: EM | Admit: 2016-08-21 | Discharge: 2016-08-21 | Disposition: A | Discharge status: Home / Self Care | Payer: PPO | Attending: Physician Assistant | Admitting: Physician Assistant

## 2016-08-21 ENCOUNTER — Encounter (HOSPITAL_COMMUNITY): Payer: Self-pay

## 2016-08-21 ENCOUNTER — Emergency Department (HOSPITAL_COMMUNITY): Payer: PPO

## 2016-08-21 DIAGNOSIS — F17210 Nicotine dependence, cigarettes, uncomplicated: Secondary | ICD-10-CM | POA: Insufficient documentation

## 2016-08-21 DIAGNOSIS — G8918 Other acute postprocedural pain: Secondary | ICD-10-CM | POA: Diagnosis not present

## 2016-08-21 DIAGNOSIS — E1140 Type 2 diabetes mellitus with diabetic neuropathy, unspecified: Secondary | ICD-10-CM | POA: Insufficient documentation

## 2016-08-21 DIAGNOSIS — Z794 Long term (current) use of insulin: Secondary | ICD-10-CM | POA: Diagnosis not present

## 2016-08-21 DIAGNOSIS — J449 Chronic obstructive pulmonary disease, unspecified: Secondary | ICD-10-CM | POA: Insufficient documentation

## 2016-08-21 DIAGNOSIS — Y798 Miscellaneous orthopedic devices associated with adverse incidents, not elsewhere classified: Secondary | ICD-10-CM | POA: Insufficient documentation

## 2016-08-21 DIAGNOSIS — T814XXA Infection following a procedure, initial encounter: Secondary | ICD-10-CM | POA: Insufficient documentation

## 2016-08-21 DIAGNOSIS — T8140XA Infection following a procedure, unspecified, initial encounter: Secondary | ICD-10-CM

## 2016-08-21 DIAGNOSIS — I10 Essential (primary) hypertension: Secondary | ICD-10-CM | POA: Diagnosis not present

## 2016-08-21 DIAGNOSIS — M545 Low back pain: Secondary | ICD-10-CM | POA: Diagnosis not present

## 2016-08-21 LAB — BASIC METABOLIC PANEL
Anion gap: 5 (ref 5–15)
BUN: 13 mg/dL (ref 6–20)
CHLORIDE: 106 mmol/L (ref 101–111)
CO2: 30 mmol/L (ref 22–32)
Calcium: 9.1 mg/dL (ref 8.9–10.3)
Creatinine, Ser: 0.71 mg/dL (ref 0.61–1.24)
GFR calc Af Amer: >60 mL/min (ref >60)
GFR calc non Af Amer: >60 mL/min (ref >60)
Glucose, Bld: 109 mg/dL — ABNORMAL HIGH (ref 65–99)
POTASSIUM: 4.2 mmol/L (ref 3.5–5.1)
SODIUM: 141 mmol/L (ref 135–145)

## 2016-08-21 LAB — CBC WITH DIFFERENTIAL/PLATELET
Basophils Absolute: 0 10*3/uL (ref 0.0–0.1)
Basophils Relative: 0 %
EOS PCT: 1 %
Eosinophils Absolute: 0.1 10*3/uL (ref 0.0–0.7)
HCT: 37 % — ABNORMAL LOW (ref 39.0–52.0)
HEMOGLOBIN: 12.5 g/dL — AB (ref 13.0–17.0)
LYMPHS ABS: 1.5 10*3/uL (ref 0.7–4.0)
LYMPHS PCT: 26 %
MCH: 31.5 pg (ref 26.0–34.0)
MCHC: 33.8 g/dL (ref 30.0–36.0)
MCV: 93.2 fL (ref 78.0–100.0)
Monocytes Absolute: 0.6 10*3/uL (ref 0.1–1.0)
Monocytes Relative: 10 %
NEUTROS PCT: 63 %
Neutro Abs: 3.7 10*3/uL (ref 1.7–7.7)
Platelets: 121 10*3/uL — ABNORMAL LOW (ref 150–400)
RBC: 3.97 MIL/uL — AB (ref 4.22–5.81)
RDW: 12.9 % (ref 11.5–15.5)
WBC: 5.8 10*3/uL (ref 4.0–10.5)

## 2016-08-21 MED ORDER — GADOBENATE DIMEGLUMINE 529 MG/ML IV SOLN
15.0000 mL | Freq: Once | INTRAVENOUS | Status: AC | PRN
  Administered 2016-08-21: 15 mL via INTRAVENOUS

## 2016-08-21 MED ORDER — HYDROCODONE-ACETAMINOPHEN 5-325 MG PO TABS
1.0000 | ORAL_TABLET | Freq: Once | ORAL | Status: AC
  Administered 2016-08-21: 1 via ORAL
  Filled 2016-08-21: qty 1

## 2016-08-21 MED ORDER — ONDANSETRON HCL 4 MG/2ML IJ SOLN
4.0000 mg | INTRAMUSCULAR | Status: AC
  Administered 2016-08-21: 4 mg via INTRAVENOUS
  Filled 2016-08-21: qty 2

## 2016-08-21 MED ORDER — MORPHINE SULFATE (PF) 4 MG/ML IV SOLN
4.0000 mg | Freq: Once | INTRAVENOUS | Status: AC
  Administered 2016-08-21: 4 mg via INTRAVENOUS
  Filled 2016-08-21: qty 1

## 2016-08-21 NOTE — ED Provider Notes (Signed)
[facility] DEPT Provider Note   CSN: [id number] Arrival date & time: 08/21/16  1456     History   Chief Complaint Chief Complaint  Patient presents with  . Post-op Problem    HPI Mark Nash is a 60 y.o. male.  Mark Nash is a 60 y.o. Male who presents to the ED complaining of swelling for 3-4 days to his back where he had a recent back surgery. Patient reports he had a lumbar laminectomy on 11/20 by Dr. Cyndy Freeze. He has been doing well since the surgery. He reports about 3-4 days ago he began having swelling to his back at his surgical incision site. He reports he just began to have some pain at the site. He denies any numbness, tingling or weakness. No loss of bowel or bladder control. No fevers. He does not believe he has any hardware in place. He denies any injury to the site. He is not on anticoagulants. Patient denies fevers, numbness, tingling, weakness, loss of bladder control, loss of bowel control, abdominal pain, nausea, vomiting, diarrhea, rashes, chest pain or coughing.    The history is provided by the patient. No language interpreter was used.    Past Medical History:  Diagnosis Date  . COPD (chronic obstructive pulmonary disease) ([place])   . Diabetes mellitus without complication ([place])   . Hepatitis C   . Hyperlipidemia   . Hypertension   . Schizoaffective disorder ([place])   . Vitamin D deficiency     Patient Active Problem List   Diagnosis Date Noted  . Lymphedema 07/07/2016  . Hepatitis C 06/01/2016  . Lumbar herniated disc 11/24/2015  . Protein malnutrition ([place]) 11/11/2015  . Thrombocytopenia ([place]) 11/11/2015  . Hepatitis C antibody test positive 07/13/2015  . Chronic constipation 07/13/2015  . Abnormal immunological findings in specimens from other organs, systems and tissues 07/13/2015  . CN (constipation) 07/13/2015  . Benign hypertension 03/08/2015  . COPD, mild ([place]) 03/08/2015  . Dyslipidemia 03/08/2015  . Paranoid schizophrenia ([place])  03/08/2015  . Acquired polyneuropathy ([place]) 03/08/2015  . Restless leg 03/08/2015  . Tobacco abuse 03/08/2015  . Callus of foot 03/08/2015  . Claudication ([place]) 03/08/2015  . Chronic obstructive pulmonary disease ([place]) 03/08/2015  . Corn or callus 03/08/2015  . Essential (primary) hypertension 03/08/2015  . HLD (hyperlipidemia) 03/08/2015  . Peripheral vascular disease ([place]) 03/08/2015  . Polyneuropathy ([place]) 03/08/2015  . Current tobacco use 03/08/2015  . Vitamin D deficiency 04/26/2009  . Avitaminosis D 04/26/2009    Past Surgical History:  Procedure Laterality Date  . HERNIA REPAIR     inguinal  . L3 TO L5 LAMINECTOMY FOR DECOMPRESSION  07/17/2016   [facility]  . SINUSOTOMY         Home Medications    Prior to Admission medications   Medication Sig Start Date End Date Taking? Authorizing Provider  diclofenac (VOLTAREN) 75 MG EC tablet Take 1 tablet (75 mg total) by mouth 2 (two) times daily. 07/03/16  Yes [name], MD  diphenhydramine-acetaminophen (TYLENOL PM) 25-500 MG TABS tablet Take 0.5 tablets by mouth at bedtime as needed (FOR SLEEP).   Yes Historical Provider, MD  gabapentin (NEURONTIN) 300 MG capsule Take 1 capsule by mouth 3 (three) times daily. 05/17/16  Yes Kevan Ny Ditty, MD  HYDROcodone-acetaminophen (NORCO) 7.5-325 MG tablet Take 0.5 tablets by mouth every 6 (six) hours.  08/09/16  Yes Kevan Ny Ditty, MD  insulin detemir (LEVEMIR) 100 UNIT/ML injection Inject 0.05 mLs (5 Units  total) into the skin at bedtime. Patient taking differently: Inject 5 Units into the skin every evening.  03/21/16  Yes [name], MD  lisinopril (PRINIVIL,ZESTRIL) 20 MG tablet Take 1 tablet (20 mg total) by mouth daily. 08/15/16  Yes [name], MD  lubiprostone (AMITIZA) 24 MCG capsule Take 1 capsule (24 mcg total) by mouth 2 (two) times daily with a meal. Patient taking differently: Take 24 mcg by mouth daily with breakfast.  03/21/16   Yes [name], MD  methocarbamol (ROBAXIN) 750 MG tablet Take 750 mg by mouth 4 (four) times daily.  07/24/16  Yes Historical Provider, MD  pravastatin (PRAVACHOL) 40 MG tablet Take 1 tablet (40 mg total) by mouth daily. Patient taking differently: Take 20 mg by mouth at bedtime.  08/15/16  Yes [name], MD  Vitamin D, Ergocalciferol, (DRISDOL) 50000 units CAPS capsule TAKE ONE CAPSULE BY MOUTH ONCE A WEEK OR  BIWEKLY  AS  DIRECTED Patient taking differently: TAKE ONE CAPSULE BY MOUTH EVERY 14 DAYS 05/01/16  Yes [name], MD    Family History Family History  Problem Relation Age of Onset  . Hypertension Mother   . Diabetes Father   . Hypertension Father   . Heart disease Father   . Diverticulitis Brother     Social History Social History  Substance Use Topics  . Smoking status: Current Every Day Smoker    Packs/day: 1.50    Years: 45.00    Types: Cigarettes  . Smokeless tobacco: Never Used  . Alcohol use No     Allergies   Aspirin; Cyclobenzaprine; and Penicillins   Review of Systems Review of Systems  Constitutional: Negative for chills and fever.  HENT: Negative for congestion and sore throat.   Eyes: Negative for visual disturbance.  Respiratory: Negative for cough and shortness of breath.   Cardiovascular: Negative for chest pain and palpitations.  Gastrointestinal: Negative for abdominal pain, diarrhea, nausea and vomiting.  Genitourinary: Negative for difficulty urinating and dysuria.  Musculoskeletal: Positive for back pain. Negative for neck pain.  Skin: Positive for wound. Negative for rash.  Neurological: Negative for dizziness, syncope, weakness, light-headedness, numbness and headaches.     Physical Exam Updated Vital Signs BP 154/77   Pulse (!) 59   Temp 98.2 F (36.8 C) (Oral)   Resp 16   Ht 5\' 11"  (1.803 m)   Wt 73 kg   SpO2 100%   BMI 22.45 kg/m   Physical Exam  Constitutional: He is oriented to person, place, and time. He  appears well-developed and well-nourished. No distress.  Nontoxic appearing.  HENT:  Head: Normocephalic and atraumatic.  Mouth/Throat: Oropharynx is clear and moist.  Eyes: Conjunctivae are normal. Pupils are equal, round, and reactive to light. Right eye exhibits no discharge. Left eye exhibits no discharge.  Neck: Neck supple.  Cardiovascular: Normal rate, regular rhythm, normal heart sounds and intact distal pulses.  Exam reveals no gallop and no friction rub.   No murmur heard. Pulmonary/Chest: Effort normal and breath sounds normal. No respiratory distress. He has no wheezes. He has no rales.  Abdominal: Soft. There is no tenderness.  Musculoskeletal: Normal range of motion. He exhibits edema and tenderness. He exhibits no deformity.  Focal area of soft tissue swelling to his lumbar spine. No erythema. No discharge. Mild TTP. See picture for further details.   Lymphadenopathy:    He has no cervical adenopathy.  Neurological: He is alert and oriented to person, place, and time. He displays normal  reflexes. No sensory deficit. Coordination normal.  Sensation is intact in his bilateral lower extremities.  Bilateral patellar DTRs are intact.  Skin: Skin is warm and dry. Capillary refill takes less than 2 seconds. No rash noted. He is not diaphoretic. No erythema. No pallor.  Psychiatric: He has a normal mood and affect. His behavior is normal.  Nursing note and vitals reviewed.          ED Treatments / Results  Labs (all labs ordered are listed, but only abnormal results are displayed) Labs Reviewed  BASIC METABOLIC PANEL - Abnormal; Notable for the following:       Result Value   Glucose, Bld 109 (*)    All other components within normal limits  CBC WITH DIFFERENTIAL/PLATELET - Abnormal; Notable for the following:    RBC 3.97 (*)    Hemoglobin 12.5 (*)    HCT 37.0 (*)    Platelets 121 (*)    All other components within normal limits    EKG  EKG Interpretation None         Radiology Mr Lumbar Spine W Wo Contrast  Result Date: 08/21/2016 CLINICAL DATA:  60 year old male post surgery in November 2017. Noticed area of swelling below scar. No fevers. Question seroma versus abscess versus leak. Subsequent encounter. EXAM: MRI LUMBAR SPINE WITHOUT AND WITH CONTRAST TECHNIQUE: Multiplanar and multiecho pulse sequences of the lumbar spine were obtained without and with intravenous contrast. CONTRAST:  49mL MULTIHANCE GADOBENATE DIMEGLUMINE 529 MG/ML IV SOLN COMPARISON:  Intraoperative lateral view of the lumbar spine 07/17/2016. Preoperative lumbar spine MR 07/13/2016. FINDINGS: Segmentation: Last fully open disk space is labeled L5-S1. Present examination incorporates from T12-L1 disc space through lower sacrum. Alignment:  Slight straightening. Vertebrae:  No abnormal enhancing lesion. Conus medullaris: Extends to the L1 level and appears normal. Paraspinal and other soft tissues: Renal cysts greater on the right. Congenitally narrowed canal.  Additionally: T11-12:  Schmorl's node deformity. T12-L1: There may be fusion across the disc space. Bulge and osteophyte slightly greater to right. Mild indentation ventral thecal sac. L1-2: Schmorl's node deformity. Small broad-based left paracentral protrusion with indentation left ventral thecal sac. Mild facet degenerative changes. L2-3: Schmorl's node deformity. Mild bulge. Mild indentation ventral thecal sac. Mild facet degenerative changes. L3-4: Small Schmorl's node deformity. Bulge with foraminal extension greater on left. Left greater right foraminal narrowing. Facet degenerative changes. Mild spinal stenosis. L4-5: Prior laminectomy. Large 7 x 6.3 x 5.5 cm postoperative fluid collection extends from the L3-4 disc space level to the upper L5 level. There is a portion of fluid collection with extends along the left lateral aspect of the thecal sac at the L3-4 level an L4-5 level. This reaches to the subcutaneous region. This may  represent result of postoperative leak, hemorrhage/seroma or in the proper clinical setting infection. This contributes to flattening of the posterior aspect of the thecal sac greater on the left. L4-5 bulge greater to left. Multifactorial mild spinal stenosis greater on the left. Moderate foraminal narrowing greater on the left. Mild bilateral foraminal narrowing. L5-S1: Small central protrusion with indentation ventral thecal sac. Mild facet degenerative changes. Mild bilateral foraminal narrowing. IMPRESSION: L4-5 prior laminectomy. Large 7 x 6.3 x 5.5 cm postoperative fluid collection in the laminectomy site extends to the just below the skin. There is a portion of fluid collection with extends along the left lateral aspect of the thecal sac at the L3-4 level an L4-5 level. This may represent result of postoperative leak, hemorrhage/seroma or in  the proper clinical setting infection. This contributes to flattening of the posterior aspect of the thecal sac greater on the left. L4-5 bulge greater to left. Multifactorial mild spinal stenosis greater on the left. Moderate foraminal narrowing greater on the left. Mild bilateral foraminal narrowing. Remainder of degenerative changes which are superimposed upon a congenitally narrowed canal appear similar to preoperative exam as detailed. Electronically Signed   By: Genia Del M.D.   On: 08/21/2016 19:49    Procedures Procedures (including critical care time)  Medications Ordered in ED Medications  morphine 4 MG/ML injection 4 mg (4 mg Intravenous Given 08/21/16 1737)  ondansetron (ZOFRAN) injection 4 mg (4 mg Intravenous Given 08/21/16 1736)  gadobenate dimeglumine (MULTIHANCE) injection 15 mL (15 mLs Intravenous Contrast Given 08/21/16 1855)  HYDROcodone-acetaminophen (NORCO/VICODIN) 5-325 MG per tablet 1 tablet (1 tablet Oral Given 08/21/16 2036)     Initial Impression / Assessment and Plan / ED Course  I have reviewed the triage vital signs and  the nursing notes.  Pertinent labs & imaging results that were available during my care of the patient were reviewed by me and considered in my medical decision making (see chart for details).  Clinical Course     This is a 59 y.o. Male who presents to the ED complaining of swelling for 3-4 days to his back where he had a recent back surgery. Patient reports he had a lumbar laminectomy on 11/20 by Dr. Cyndy Freeze. He has been doing well since the surgery. He reports about 3-4 days ago he began having swelling to his back at his surgical incision site. He reports he just began to have some pain at the site. He denies any numbness, tingling or weakness. No loss of bowel or bladder control. No fevers. He does not believe he has any hardware in place. He denies any injury to the site. He is not on anticoagulants. Patient denies fevers, numbness, tingling, weakness, loss of bladder control, loss of bowel control. On exam the patient is afebrile nontoxic appearing. He has no focal neurological deficits. He has a focal area of edema without overlying skin changes to his lumbar spine. See picture above for further details. No evidence of erythema or discharge. No evidence of infection.   I consulted with radiology who recommended MRI with contrast for further evaluation.  Patient with normal white count. BMP is unremarkable.  I consulted with Dr. Sherwood Gambler from neurosurgery who viewed the imaging and feels he has a CSF leak and this is not emergent. He can have follow-up in a couple days with his neurosurgeon Ditty.   I discussed results with the patient. He agrees with plan. Discussed watch for signs of infection. Advised that the develops fevers, numbness, weakness, loss of bladder control, loss of bowel control, new or worsening concerns he can return to the emergency department for reevaluation. Has Norco at home for pain control. I advised the patient to follow-up with their primary care provider this week. I  advised the patient to return to the emergency department with new or worsening symptoms or new concerns. The patient verbalized understanding and agreement with plan.      Final Clinical Impressions(s) / ED Diagnoses   Final diagnoses:  Post op infection  Post-operative pain    New Prescriptions New Prescriptions   No medications on file     Waynetta Pean, PA-C 08/21/16 2110    [place], MD 08/30/16 1607

## 2016-08-21 NOTE — ED Triage Notes (Signed)
Per Pt, Pt had back surgery on 11/22. Pt reports three days ago noticing some swelling on the site and an abscess at the surgical site. Pt was told to come in by MD Diddy. Denies fever.

## 2016-08-21 NOTE — ED Notes (Signed)
Pt's has had surgery in november on his spine and said for a few days now the incision has been swollen and feels like there is lots of fluid around the site. No redness, nor fever in the area.

## 2016-08-21 NOTE — Discharge Instructions (Signed)
MRI today showed a post operative fluid collection that is most likely from CSF. Please follow up with Dr. Cyndy Freeze on Wednesday as we discussed.   If you develop fevers, numbness, weakness, loss of bladder control or loss of bowel control, or new or worsening symptoms or new concerns please return to the emergency department for reevaluation.

## 2016-08-21 NOTE — ED Notes (Signed)
Patient transported to MRI 

## 2016-08-30 ENCOUNTER — Other Ambulatory Visit: Payer: Self-pay | Admitting: Neurological Surgery

## 2016-09-04 ENCOUNTER — Encounter (HOSPITAL_COMMUNITY): Payer: Self-pay | Admitting: *Deleted

## 2016-09-04 NOTE — Progress Notes (Signed)
Spoke with pt for pre-op call. Pt denies cardiac history, chest pain or sob. Pt is diabetic, last A1C was 6.0 on 08/15/16. Pt states he does not check his blood sugar at home. Instructed pt to take 1/2 of his regular dose of Levemir Insulin this evening (will take 2 units this evening). Pt voiced understanding.

## 2016-09-06 ENCOUNTER — Ambulatory Visit (INDEPENDENT_AMBULATORY_CARE_PROVIDER_SITE_OTHER): Payer: PPO | Admitting: Vascular Surgery

## 2016-09-07 NOTE — Anesthesia Preprocedure Evaluation (Signed)
Anesthesia Evaluation  Patient identified by MRN, date of birth, ID band Patient awake    Reviewed: Allergy & Precautions, H&P , Patient's Chart, lab work & pertinent test results, reviewed documented beta blocker date and time   Airway Mallampati: II  TM Distance: >3 FB Neck ROM: full    Dental no notable dental hx.    Pulmonary Current Smoker,    Pulmonary exam normal breath sounds clear to auscultation       Cardiovascular hypertension,  Rhythm:regular Rate:Normal     Neuro/Psych    GI/Hepatic   Endo/Other  diabetes  Renal/GU      Musculoskeletal   Abdominal   Peds  Hematology   Anesthesia Other Findings   Reproductive/Obstetrics                             Anesthesia Physical Anesthesia Plan  ASA: II  Anesthesia Plan: General   Post-op Pain Management:    Induction: Intravenous  Airway Management Planned: Oral ETT  Additional Equipment:   Intra-op Plan:   Post-operative Plan: Extubation in OR  Informed Consent: I have reviewed the patients History and Physical, chart, labs and discussed the procedure including the risks, benefits and alternatives for the proposed anesthesia with the patient or authorized representative who has indicated his/her understanding and acceptance.   Dental Advisory Given and Dental advisory given  Plan Discussed with: CRNA and Surgeon  Anesthesia Plan Comments: (  Discussed general anesthesia, including possible nausea, instrumentation of airway, sore throat,pulmonary aspiration, etc. I asked if the were any outstanding questions, or  concerns before we proceeded. )        Anesthesia Quick Evaluation  

## 2016-09-07 NOTE — Progress Notes (Signed)
Pt verbalized understanding of all pre-op instructions given on 09/05/15; updated with new surgery date and arrival time. Pt made aware to stop taking Aspirin, vitamins, fish oil and herbal medications. Do not take any NSAIDs ie: Ibuprofen, Advil, Naproxen, BC and Goody Powder or any medication containing Aspirin such as Voltaren. Pt stated that Hydrocodone makes him nauseous so, pt advised to only take Gabapentin with a small sip of water the morning of surgery. Pt verbalized that he will only take 2 units of Levemir insulin tonight and stated that he does not check his blood glucose. Pt verbalized understanding of all pre-op instructions given on 09/05/15.

## 2016-09-08 ENCOUNTER — Encounter (HOSPITAL_COMMUNITY): Payer: Self-pay | Admitting: *Deleted

## 2016-09-08 ENCOUNTER — Inpatient Hospital Stay (HOSPITAL_COMMUNITY): Payer: PPO | Admitting: Anesthesiology

## 2016-09-08 ENCOUNTER — Inpatient Hospital Stay (HOSPITAL_COMMUNITY)
Admission: RE | Admit: 2016-09-08 | Discharge: 2016-09-09 | DRG: 029 | Disposition: A | Discharge status: Home / Self Care | Payer: PPO | Source: Ambulatory Visit | Attending: Neurological Surgery | Admitting: Neurological Surgery

## 2016-09-08 ENCOUNTER — Encounter (HOSPITAL_COMMUNITY)
Admission: RE | Disposition: A | Discharge status: Home / Self Care | Payer: Self-pay | Source: Ambulatory Visit | Attending: Neurological Surgery

## 2016-09-08 DIAGNOSIS — S0081XA Abrasion of other part of head, initial encounter: Secondary | ICD-10-CM | POA: Diagnosis present

## 2016-09-08 DIAGNOSIS — F17210 Nicotine dependence, cigarettes, uncomplicated: Secondary | ICD-10-CM | POA: Diagnosis present

## 2016-09-08 DIAGNOSIS — Z886 Allergy status to analgesic agent status: Secondary | ICD-10-CM | POA: Diagnosis not present

## 2016-09-08 DIAGNOSIS — Y929 Unspecified place or not applicable: Secondary | ICD-10-CM | POA: Diagnosis not present

## 2016-09-08 DIAGNOSIS — G9619 Other disorders of meninges, not elsewhere classified: Secondary | ICD-10-CM | POA: Diagnosis not present

## 2016-09-08 DIAGNOSIS — E119 Type 2 diabetes mellitus without complications: Secondary | ICD-10-CM | POA: Diagnosis not present

## 2016-09-08 DIAGNOSIS — E785 Hyperlipidemia, unspecified: Secondary | ICD-10-CM | POA: Diagnosis not present

## 2016-09-08 DIAGNOSIS — J449 Chronic obstructive pulmonary disease, unspecified: Secondary | ICD-10-CM | POA: Diagnosis present

## 2016-09-08 DIAGNOSIS — S80212A Abrasion, left knee, initial encounter: Secondary | ICD-10-CM | POA: Diagnosis present

## 2016-09-08 DIAGNOSIS — S80211A Abrasion, right knee, initial encounter: Secondary | ICD-10-CM | POA: Diagnosis present

## 2016-09-08 DIAGNOSIS — G9782 Other postprocedural complications and disorders of nervous system: Secondary | ICD-10-CM | POA: Diagnosis not present

## 2016-09-08 DIAGNOSIS — Z888 Allergy status to other drugs, medicaments and biological substances status: Secondary | ICD-10-CM | POA: Diagnosis not present

## 2016-09-08 DIAGNOSIS — Z880 Allergy status to penicillin: Secondary | ICD-10-CM

## 2016-09-08 DIAGNOSIS — W19XXXA Unspecified fall, initial encounter: Secondary | ICD-10-CM | POA: Diagnosis not present

## 2016-09-08 DIAGNOSIS — I10 Essential (primary) hypertension: Secondary | ICD-10-CM | POA: Diagnosis not present

## 2016-09-08 DIAGNOSIS — Z794 Long term (current) use of insulin: Secondary | ICD-10-CM

## 2016-09-08 DIAGNOSIS — G960 Cerebrospinal fluid leak: Secondary | ICD-10-CM | POA: Diagnosis present

## 2016-09-08 HISTORY — DX: Personal history of urinary calculi: Z87.442

## 2016-09-08 HISTORY — DX: Constipation, unspecified: K59.00

## 2016-09-08 HISTORY — DX: Unspecified osteoarthritis, unspecified site: M19.90

## 2016-09-08 HISTORY — DX: Unspecified asthma, uncomplicated: J45.909

## 2016-09-08 HISTORY — DX: Restless legs syndrome: G25.81

## 2016-09-08 HISTORY — PX: REPAIR OF CEREBROSPINAL FLUID LEAK: SHX6322

## 2016-09-08 LAB — BASIC METABOLIC PANEL
Anion gap: 6 (ref 5–15)
BUN: 13 mg/dL (ref 6–20)
CHLORIDE: 111 mmol/L (ref 101–111)
CO2: 24 mmol/L (ref 22–32)
Calcium: 9.2 mg/dL (ref 8.9–10.3)
Creatinine, Ser: 0.71 mg/dL (ref 0.61–1.24)
GFR calc Af Amer: >60 mL/min (ref >60)
GFR calc non Af Amer: >60 mL/min (ref >60)
GLUCOSE: 91 mg/dL (ref 65–99)
POTASSIUM: 3.3 mmol/L — AB (ref 3.5–5.1)
Sodium: 141 mmol/L (ref 135–145)

## 2016-09-08 LAB — GLUCOSE, CAPILLARY
GLUCOSE-CAPILLARY: 79 mg/dL (ref 65–99)
GLUCOSE-CAPILLARY: 146 mg/dL — AB (ref 65–99)
GLUCOSE-CAPILLARY: 163 mg/dL — AB (ref 65–99)
Glucose-Capillary: 128 mg/dL — ABNORMAL HIGH (ref 65–99)

## 2016-09-08 LAB — CBC
HCT: 38.8 % — ABNORMAL LOW (ref 39.0–52.0)
HEMOGLOBIN: 13.2 g/dL (ref 13.0–17.0)
MCH: 30.7 pg (ref 26.0–34.0)
MCHC: 34 g/dL (ref 30.0–36.0)
MCV: 90.2 fL (ref 78.0–100.0)
Platelets: 133 10*3/uL — ABNORMAL LOW (ref 150–400)
RBC: 4.3 MIL/uL (ref 4.22–5.81)
RDW: 12.8 % (ref 11.5–15.5)
WBC: 7.8 10*3/uL (ref 4.0–10.5)

## 2016-09-08 LAB — SURGICAL PCR SCREEN
MRSA, PCR: NEGATIVE
Staphylococcus aureus: NEGATIVE

## 2016-09-08 SURGERY — REPAIR OF CEREBROSPINAL FLUID LEAK
Anesthesia: General | Site: Spine Lumbar

## 2016-09-08 MED ORDER — LIDOCAINE-EPINEPHRINE (PF) 2 %-1:200000 IJ SOLN
INTRAMUSCULAR | Status: AC
  Filled 2016-09-08: qty 20

## 2016-09-08 MED ORDER — GABAPENTIN 300 MG PO CAPS
300.0000 mg | ORAL_CAPSULE | Freq: Three times a day (TID) | ORAL | Status: DC
  Administered 2016-09-08 – 2016-09-09 (×3): 300 mg via ORAL
  Filled 2016-09-08 (×3): qty 1

## 2016-09-08 MED ORDER — ONDANSETRON HCL 4 MG/2ML IJ SOLN
INTRAMUSCULAR | Status: DC | PRN
  Administered 2016-09-08: 4 mg via INTRAVENOUS

## 2016-09-08 MED ORDER — OXYCODONE HCL ER 20 MG PO T12A
20.0000 mg | EXTENDED_RELEASE_TABLET | Freq: Two times a day (BID) | ORAL | Status: DC
  Administered 2016-09-08 – 2016-09-09 (×3): 20 mg via ORAL
  Filled 2016-09-08 (×3): qty 1

## 2016-09-08 MED ORDER — THROMBIN 5000 UNITS EX SOLR
CUTANEOUS | Status: AC
  Filled 2016-09-08: qty 5000

## 2016-09-08 MED ORDER — ONDANSETRON HCL 4 MG/2ML IJ SOLN: 4.0000 mg | INTRAMUSCULAR | Status: DC | PRN

## 2016-09-08 MED ORDER — FENTANYL CITRATE (PF) 100 MCG/2ML IJ SOLN
INTRAMUSCULAR | Status: AC
  Filled 2016-09-08: qty 2

## 2016-09-08 MED ORDER — PANTOPRAZOLE SODIUM 40 MG IV SOLR: 40.0000 mg | Freq: Every day | INTRAVENOUS | Status: DC

## 2016-09-08 MED ORDER — PHENYLEPHRINE HCL 10 MG/ML IJ SOLN
INTRAMUSCULAR | Status: DC | PRN
  Administered 2016-09-08 (×2): 80 ug via INTRAVENOUS

## 2016-09-08 MED ORDER — DICLOFENAC SODIUM 75 MG PO TBEC: 75.0000 mg | DELAYED_RELEASE_TABLET | Freq: Two times a day (BID) | ORAL | Status: DC

## 2016-09-08 MED ORDER — MUPIROCIN 2 % EX OINT
TOPICAL_OINTMENT | CUTANEOUS | Status: AC
Start: 2016-09-08 — End: 2016-09-08
  Filled 2016-09-08: qty 22

## 2016-09-08 MED ORDER — 0.9 % SODIUM CHLORIDE (POUR BTL) OPTIME
TOPICAL | Status: DC | PRN
  Administered 2016-09-08: 1000 mL

## 2016-09-08 MED ORDER — DOCUSATE SODIUM 100 MG PO CAPS
100.0000 mg | ORAL_CAPSULE | Freq: Two times a day (BID) | ORAL | Status: DC
  Administered 2016-09-08 – 2016-09-09 (×3): 100 mg via ORAL
  Filled 2016-09-08 (×3): qty 1

## 2016-09-08 MED ORDER — ALUM & MAG HYDROXIDE-SIMETH 200-200-20 MG/5ML PO SUSP: 30.0000 mL | Freq: Four times a day (QID) | ORAL | Status: DC | PRN

## 2016-09-08 MED ORDER — GABAPENTIN 300 MG PO CAPS: 300.0000 mg | ORAL_CAPSULE | Freq: Three times a day (TID) | ORAL | Status: DC

## 2016-09-08 MED ORDER — LUBIPROSTONE 24 MCG PO CAPS
24.0000 ug | ORAL_CAPSULE | Freq: Every day | ORAL | Status: DC
  Administered 2016-09-09: 24 ug via ORAL
  Filled 2016-09-08: qty 1

## 2016-09-08 MED ORDER — OXYCODONE HCL 5 MG PO TABS
5.0000 mg | ORAL_TABLET | ORAL | Status: DC | PRN
  Administered 2016-09-08 – 2016-09-09 (×3): 10 mg via ORAL
  Filled 2016-09-08 (×3): qty 2

## 2016-09-08 MED ORDER — BUPIVACAINE HCL (PF) 0.25 % IJ SOLN
INTRAMUSCULAR | Status: AC
  Filled 2016-09-08: qty 30

## 2016-09-08 MED ORDER — DIPHENHYDRAMINE HCL 12.5 MG/5ML PO ELIX: 12.5000 mg | ORAL_SOLUTION | Freq: Every evening | ORAL | Status: DC | PRN

## 2016-09-08 MED ORDER — FENTANYL CITRATE (PF) 100 MCG/2ML IJ SOLN
25.0000 ug | INTRAMUSCULAR | Status: DC | PRN
  Administered 2016-09-08 (×2): 50 ug via INTRAVENOUS

## 2016-09-08 MED ORDER — BUPIVACAINE LIPOSOME 1.3 % IJ SUSP
20.0000 mL | INTRAMUSCULAR | Status: AC
  Administered 2016-09-08: 20 mL
  Filled 2016-09-08: qty 20

## 2016-09-08 MED ORDER — PROPOFOL 10 MG/ML IV BOLUS
INTRAVENOUS | Status: AC
  Filled 2016-09-08: qty 20

## 2016-09-08 MED ORDER — BUPIVACAINE HCL (PF) 0.25 % IJ SOLN
INTRAMUSCULAR | Status: DC | PRN
  Administered 2016-09-08: 15 mL

## 2016-09-08 MED ORDER — MIDAZOLAM HCL 2 MG/2ML IJ SOLN
INTRAMUSCULAR | Status: AC
  Filled 2016-09-08: qty 2

## 2016-09-08 MED ORDER — BISACODYL 10 MG RE SUPP: 10.0000 mg | Freq: Every day | RECTAL | Status: DC | PRN

## 2016-09-08 MED ORDER — THROMBIN 5000 UNITS EX SOLR
CUTANEOUS | Status: AC
  Filled 2016-09-08: qty 15000

## 2016-09-08 MED ORDER — CHLORHEXIDINE GLUCONATE CLOTH 2 % EX PADS: 6.0000 | MEDICATED_PAD | Freq: Once | CUTANEOUS | Status: DC

## 2016-09-08 MED ORDER — VANCOMYCIN HCL IN DEXTROSE 1-5 GM/200ML-% IV SOLN
1000.0000 mg | INTRAVENOUS | Status: AC
  Administered 2016-09-08: 1000 mg via INTRAVENOUS
  Filled 2016-09-08: qty 200

## 2016-09-08 MED ORDER — CELECOXIB 200 MG PO CAPS
200.0000 mg | ORAL_CAPSULE | Freq: Two times a day (BID) | ORAL | Status: DC
  Administered 2016-09-08 – 2016-09-09 (×3): 200 mg via ORAL
  Filled 2016-09-08 (×3): qty 1

## 2016-09-08 MED ORDER — SODIUM CHLORIDE 0.9 % IV SOLN: 250.0000 mL | INTRAVENOUS | Status: DC

## 2016-09-08 MED ORDER — PANTOPRAZOLE SODIUM 40 MG PO TBEC
40.0000 mg | DELAYED_RELEASE_TABLET | Freq: Every day | ORAL | Status: DC
  Administered 2016-09-08: 40 mg via ORAL
  Filled 2016-09-08: qty 1

## 2016-09-08 MED ORDER — PROPOFOL 10 MG/ML IV BOLUS
INTRAVENOUS | Status: DC | PRN
  Administered 2016-09-08: 100 mg via INTRAVENOUS

## 2016-09-08 MED ORDER — LISINOPRIL 20 MG PO TABS
20.0000 mg | ORAL_TABLET | Freq: Every day | ORAL | Status: DC
  Administered 2016-09-08 – 2016-09-09 (×2): 20 mg via ORAL
  Filled 2016-09-08 (×2): qty 1

## 2016-09-08 MED ORDER — SODIUM CHLORIDE 0.9 % IR SOLN
Status: DC | PRN
  Administered 2016-09-08: 08:00:00

## 2016-09-08 MED ORDER — SENNA 8.6 MG PO TABS
1.0000 | ORAL_TABLET | Freq: Two times a day (BID) | ORAL | Status: DC
  Administered 2016-09-08 – 2016-09-09 (×3): 8.6 mg via ORAL
  Filled 2016-09-08 (×3): qty 1

## 2016-09-08 MED ORDER — PRAVASTATIN SODIUM 20 MG PO TABS
20.0000 mg | ORAL_TABLET | Freq: Every day | ORAL | Status: DC
  Administered 2016-09-08: 20 mg via ORAL
  Filled 2016-09-08: qty 1

## 2016-09-08 MED ORDER — THROMBIN 5000 UNITS EX SOLR
CUTANEOUS | Status: DC | PRN
  Administered 2016-09-08 (×2): via TOPICAL

## 2016-09-08 MED ORDER — DIPHENHYDRAMINE-APAP (SLEEP) 25-500 MG PO TABS: 0.5000 | ORAL_TABLET | Freq: Every evening | ORAL | Status: DC | PRN

## 2016-09-08 MED ORDER — SUGAMMADEX SODIUM 200 MG/2ML IV SOLN
INTRAVENOUS | Status: DC | PRN
  Administered 2016-09-08: 200 mg via INTRAVENOUS

## 2016-09-08 MED ORDER — VANCOMYCIN HCL IN DEXTROSE 1-5 GM/200ML-% IV SOLN
1000.0000 mg | Freq: Once | INTRAVENOUS | Status: AC
  Administered 2016-09-08: 1000 mg via INTRAVENOUS
  Filled 2016-09-08: qty 200

## 2016-09-08 MED ORDER — ROCURONIUM BROMIDE 100 MG/10ML IV SOLN
INTRAVENOUS | Status: DC | PRN
  Administered 2016-09-08: 50 mg via INTRAVENOUS

## 2016-09-08 MED ORDER — MENTHOL 3 MG MT LOZG: 1.0000 | LOZENGE | OROMUCOSAL | Status: DC | PRN

## 2016-09-08 MED ORDER — PHENOL 1.4 % MT LIQD: 1.0000 | OROMUCOSAL | Status: DC | PRN

## 2016-09-08 MED ORDER — METHOCARBAMOL 750 MG PO TABS: 750.0000 mg | ORAL_TABLET | Freq: Four times a day (QID) | ORAL | Status: DC

## 2016-09-08 MED ORDER — POTASSIUM CHLORIDE IN NACL 20-0.9 MEQ/L-% IV SOLN
100.0000 mL/h | INTRAVENOUS | Status: DC
  Administered 2016-09-08 (×2): 100 mL/h via INTRAVENOUS
  Filled 2016-09-08 (×4): qty 1000

## 2016-09-08 MED ORDER — HEMOSTATIC AGENTS (NO CHARGE) OPTIME
TOPICAL | Status: DC | PRN
  Administered 2016-09-08: 1 via TOPICAL

## 2016-09-08 MED ORDER — ACETAMINOPHEN 500 MG PO TABS
1000.0000 mg | ORAL_TABLET | Freq: Four times a day (QID) | ORAL | Status: DC
  Administered 2016-09-08 – 2016-09-09 (×4): 1000 mg via ORAL
  Filled 2016-09-08 (×4): qty 2

## 2016-09-08 MED ORDER — INSULIN DETEMIR 100 UNIT/ML ~~LOC~~ SOLN
5.0000 [IU] | Freq: Every evening | SUBCUTANEOUS | Status: DC
  Administered 2016-09-08: 5 [IU] via SUBCUTANEOUS
  Filled 2016-09-08 (×2): qty 0.05

## 2016-09-08 MED ORDER — METHYLPREDNISOLONE ACETATE 80 MG/ML IJ SUSP
INTRAMUSCULAR | Status: AC
  Filled 2016-09-08: qty 1

## 2016-09-08 MED ORDER — ARTIFICIAL TEARS OP OINT
TOPICAL_OINTMENT | OPHTHALMIC | Status: DC | PRN
  Administered 2016-09-08: 1 via OPHTHALMIC

## 2016-09-08 MED ORDER — MIDAZOLAM HCL 5 MG/5ML IJ SOLN
INTRAMUSCULAR | Status: DC | PRN
  Administered 2016-09-08: 2 mg via INTRAVENOUS

## 2016-09-08 MED ORDER — METHOCARBAMOL 750 MG PO TABS
750.0000 mg | ORAL_TABLET | Freq: Four times a day (QID) | ORAL | Status: DC
  Administered 2016-09-08 – 2016-09-09 (×4): 750 mg via ORAL
  Filled 2016-09-08 (×4): qty 1

## 2016-09-08 MED ORDER — MUPIROCIN 2 % EX OINT
1.0000 "application " | TOPICAL_OINTMENT | Freq: Once | CUTANEOUS | Status: AC
  Administered 2016-09-08: 1 via TOPICAL

## 2016-09-08 MED ORDER — KETOROLAC TROMETHAMINE 30 MG/ML IJ SOLN
INTRAMUSCULAR | Status: AC
  Filled 2016-09-08: qty 1

## 2016-09-08 MED ORDER — SODIUM CHLORIDE 0.9% FLUSH: 3.0000 mL | INTRAVENOUS | Status: DC | PRN

## 2016-09-08 MED ORDER — LIDOCAINE HCL (CARDIAC) 20 MG/ML IV SOLN
INTRAVENOUS | Status: DC | PRN
  Administered 2016-09-08: 60 mg via INTRATRACHEAL

## 2016-09-08 MED ORDER — LACTATED RINGERS IV SOLN
INTRAVENOUS | Status: DC | PRN
  Administered 2016-09-08: 07:00:00 via INTRAVENOUS

## 2016-09-08 MED ORDER — LIDOCAINE-EPINEPHRINE (PF) 2 %-1:200000 IJ SOLN
INTRAMUSCULAR | Status: DC | PRN
  Administered 2016-09-08: 15 mL via INTRADERMAL

## 2016-09-08 MED ORDER — FENTANYL CITRATE (PF) 250 MCG/5ML IJ SOLN
INTRAMUSCULAR | Status: DC | PRN
  Administered 2016-09-08 (×2): 50 ug via INTRAVENOUS

## 2016-09-08 MED ORDER — SODIUM CHLORIDE 0.9% FLUSH
3.0000 mL | Freq: Two times a day (BID) | INTRAVENOUS | Status: DC
  Administered 2016-09-08 – 2016-09-09 (×2): 3 mL via INTRAVENOUS

## 2016-09-08 MED ORDER — FLEET ENEMA 7-19 GM/118ML RE ENEM: 1.0000 | ENEMA | Freq: Once | RECTAL | Status: DC | PRN

## 2016-09-08 SURGICAL SUPPLY — 73 items
BAG DECANTER FOR FLEXI CONT (MISCELLANEOUS) ×3 IMPLANT
BENZOIN TINCTURE PRP APPL 2/3 (GAUZE/BANDAGES/DRESSINGS) IMPLANT
BIT DRILL NEURO 2X3.1 SFT TUCH (MISCELLANEOUS) IMPLANT
BLADE CLIPPER SURG (BLADE) IMPLANT
BLADE SURG 11 STRL SS (BLADE) ×3 IMPLANT
BUR MATCHSTICK NEURO 3.0 LAGG (BURR) ×3 IMPLANT
BUR ROUND FLUTED 5 RND (BURR) ×2 IMPLANT
BUR ROUND FLUTED 5MM RND (BURR) ×1
CANISTER SUCT 3000ML PPV (MISCELLANEOUS) ×6 IMPLANT
CARTRIDGE OIL MAESTRO DRILL (MISCELLANEOUS) ×1 IMPLANT
CHLORAPREP W/TINT 26ML (MISCELLANEOUS) ×6 IMPLANT
CLOSURE WOUND 1/2 X4 (GAUZE/BANDAGES/DRESSINGS)
CONT SPEC 4OZ CLIKSEAL STRL BL (MISCELLANEOUS) ×3 IMPLANT
DECANTER SPIKE VIAL GLASS SM (MISCELLANEOUS) IMPLANT
DERMABOND ADVANCED (GAUZE/BANDAGES/DRESSINGS) ×2
DERMABOND ADVANCED .7 DNX12 (GAUZE/BANDAGES/DRESSINGS) ×1 IMPLANT
DIFFUSER DRILL AIR PNEUMATIC (MISCELLANEOUS) ×3 IMPLANT
DRAPE MICROSCOPE LEICA (MISCELLANEOUS) ×3 IMPLANT
DRAPE POUCH INSTRU U-SHP 10X18 (DRAPES) ×3 IMPLANT
DRAPE SURG 17X23 STRL (DRAPES) ×3 IMPLANT
DRILL NEURO 2X3.1 SOFT TOUCH (MISCELLANEOUS)
DRSG OPSITE POSTOP 4X6 (GAUZE/BANDAGES/DRESSINGS) ×3 IMPLANT
ELECT REM PT RETURN 9FT ADLT (ELECTROSURGICAL) ×3
ELECTRODE REM PT RTRN 9FT ADLT (ELECTROSURGICAL) ×1 IMPLANT
GAUZE SPONGE 4X4 12PLY STRL (GAUZE/BANDAGES/DRESSINGS) IMPLANT
GAUZE SPONGE 4X4 16PLY XRAY LF (GAUZE/BANDAGES/DRESSINGS) IMPLANT
GLOVE BIO SURGEON STRL SZ8.5 (GLOVE) ×3 IMPLANT
GLOVE BIOGEL PI IND STRL 7.5 (GLOVE) ×2 IMPLANT
GLOVE BIOGEL PI IND STRL 8.5 (GLOVE) ×1 IMPLANT
GLOVE BIOGEL PI INDICATOR 7.5 (GLOVE) ×4
GLOVE BIOGEL PI INDICATOR 8.5 (GLOVE) ×2
GLOVE ECLIPSE 7.0 STRL STRAW (GLOVE) ×3 IMPLANT
GLOVE SS BIOGEL STRL SZ 7.5 (GLOVE) ×2 IMPLANT
GLOVE SUPERSENSE BIOGEL SZ 7.5 (GLOVE) ×4
GLOVE SURG SS PI 7.0 STRL IVOR (GLOVE) ×3 IMPLANT
GOWN STRL REUS W/ TWL LRG LVL3 (GOWN DISPOSABLE) ×2 IMPLANT
GOWN STRL REUS W/ TWL XL LVL3 (GOWN DISPOSABLE) ×1 IMPLANT
GOWN STRL REUS W/TWL LRG LVL3 (GOWN DISPOSABLE) ×4
GOWN STRL REUS W/TWL XL LVL3 (GOWN DISPOSABLE) ×2
HEMOSTAT POWDER KIT SURGIFOAM (HEMOSTASIS) ×6 IMPLANT
KIT BASIN OR (CUSTOM PROCEDURE TRAY) ×3 IMPLANT
KIT ROOM TURNOVER OR (KITS) ×3 IMPLANT
NEEDLE HYPO 18GX1.5 BLUNT FILL (NEEDLE) IMPLANT
NEEDLE HYPO 21X1.5 SAFETY (NEEDLE) ×6 IMPLANT
NEEDLE HYPO 25X1 1.5 SAFETY (NEEDLE) ×6 IMPLANT
NS IRRIG 1000ML POUR BTL (IV SOLUTION) ×3 IMPLANT
OIL CARTRIDGE MAESTRO DRILL (MISCELLANEOUS) ×3
PACK LAMINECTOMY NEURO (CUSTOM PROCEDURE TRAY) ×3 IMPLANT
PACK UNIVERSAL I (CUSTOM PROCEDURE TRAY) ×3 IMPLANT
PAD ARMBOARD 7.5X6 YLW CONV (MISCELLANEOUS) ×9 IMPLANT
PATTIES SURGICAL .5X1.5 (GAUZE/BANDAGES/DRESSINGS) IMPLANT
RUBBERBAND STERILE (MISCELLANEOUS) ×9 IMPLANT
SEALANT ADHERUS EXTEND TIP (MISCELLANEOUS) ×3 IMPLANT
SPONGE NEURO XRAY DETECT 1X3 (DISPOSABLE) IMPLANT
SPONGE SURGIFOAM ABS GEL SZ50 (HEMOSTASIS) ×3 IMPLANT
STRIP CLOSURE SKIN 1/2X4 (GAUZE/BANDAGES/DRESSINGS) IMPLANT
SUT PROLENE 6 0 BV (SUTURE) ×3 IMPLANT
SUT STRATAFIX 1PDS 45CM VIOLET (SUTURE) IMPLANT
SUT STRATAFIX MNCRL+ 3-0 PS-2 (SUTURE) ×1
SUT STRATAFIX MONOCRYL 3-0 (SUTURE) ×2
SUT VIC AB 0 CT1 18XCR BRD8 (SUTURE) ×1 IMPLANT
SUT VIC AB 0 CT1 8-18 (SUTURE) ×2
SUT VIC AB 2-0 CT1 18 (SUTURE) ×9 IMPLANT
SUT VIC AB 4-0 PS2 27 (SUTURE) IMPLANT
SUTURE STRATFX MNCRL+ 3-0 PS-2 (SUTURE) ×1 IMPLANT
SYR 20CC LL (SYRINGE) ×3 IMPLANT
SYR 30ML LL (SYRINGE) ×6 IMPLANT
SYR 5ML LL (SYRINGE) IMPLANT
TOWEL OR 17X24 6PK STRL BLUE (TOWEL DISPOSABLE) ×3 IMPLANT
TOWEL OR 17X26 10 PK STRL BLUE (TOWEL DISPOSABLE) ×3 IMPLANT
TUBE CONNECTING 12'X1/4 (SUCTIONS) ×1
TUBE CONNECTING 12X1/4 (SUCTIONS) ×2 IMPLANT
WATER STERILE IRR 1000ML POUR (IV SOLUTION) ×3 IMPLANT

## 2016-09-08 NOTE — Progress Notes (Signed)
Patient has been up and out of bed to use the toilet several times with one assist. Incentive spirometer given, thought and patient has used it. Denies pain at this time. Will continue to monitor.

## 2016-09-08 NOTE — Op Note (Signed)
09/08/2016  11:00 AM  PATIENT:  Mark Nash  61 y.o. male  PRE-OPERATIVE DIAGNOSIS:  Postprocedural pseudomeningocele  POST-OPERATIVE DIAGNOSIS:  As above  PROCEDURE:  Lumbar wound exploration, repair of cerebrospinal  Fluid leak  SURGEON:  Aldean Ast, MD  ASSISTANTS: Erline Levine, MD  ANESTHESIA:   General  DRAINS: None   SPECIMEN:  None  INDICATION FOR PROCEDURE: 61 year old male s/p lumbar decompression who presented with a delayed pseudomeningocele about a month after his surgery.  Efforts were made to manage it non-operatively but it continued to enlarge.  I recommended the above operation. Patient understood the risks, benefits, and alternatives and potential outcomes and wished to proceed.  PROCEDURE DETAILS: After smooth induction of general anesthesia the patient was turned prone on the OR table.  The patient was prepped and draped in the usual sterile fashion.  The scar was excised and the wound was re-opened.  I encountered a large volume of clear fluid consistent with spinal fluid.  I exposed the thecal sac and found a small hole on the right side of the thecal sac.  The high speed burr was used to thin the bone adjacent to this and then it was resected with a Kerrison rongeur.  The microscope was draped and brought into the field to provide light and magnification.  Using microsurgical technique I placed a figure of 8 stitch with a 6-0 prolene suture and placed a small piece of locally harvested fat within the knot.  Several valsalva maneuvers were performed and there was no spinal fluid egress.  This was then sealed with Adherus.  I irrigated with bacitracin saline.  The wound was closed in routine anatomic layers with interrupted vicryl sutures.  The skin was closed with a running monocryl and dermabond.  The patient was turned to the supine position and awoke without difficulty.  PATIENT DISPOSITION:  PACU - hemodynamically stable.   Delay start of  Pharmacological VTE agent (>24hrs) due to surgical blood loss or risk of bleeding:  yes

## 2016-09-08 NOTE — Progress Notes (Signed)
Dr. Cyndy Freeze notified of pt fall. He will check pt. Out when he gets here.

## 2016-09-08 NOTE — Progress Notes (Signed)
Pt. States that as he was on his way to hospital this morning he missed a step and fell onto concrete at his home. He did not realize until he got to hospital that both knees were skinned and scraped and he was bleeding from right and left knee.He also has abrasion on the right side of faces and on right wrist . Pt had applied a Band-Aid to the abrasion on his right wrist. Pt. Said that his head hit the  Concrete but did not lose consciousness he is currently alert and oriented. Cleaned the abrasions on his knees and applied bandages.Abrasion on face was cleaned but no dressing applied.

## 2016-09-08 NOTE — Progress Notes (Signed)
Pharmacy Antibiotic Note  Mark Nash is a 61 y.o. male admitted on 09/08/2016 who presented with a delayed pseudomeningocele about a month after his surgery- lumbar decompression. Pharmacy has been consulted for Vancomycini dosing for surgical prophylaxis x 1 dose 12 hours post op.  s/p Lumbar wound exploration, repair of cerebrospinal Fluid leak on 09/08/16. Vancomycin IV 1g Preop dose given @ [id number]  WBC wnl, afeb, Scr 0.71, crcl ~ 140ml/min No drain place thus will only give 1 dose post op. Pharmacy will sign off   Plan: Vancomycin 1gm IV x1 , okay to give ~ 12 hrs post the preop dose.  No drain place thus will only give 1 dose post op. Pharmacy will sign off  Weight: 160 lb 15 oz (73 kg) (08/21/16)  Temp (24hrs), Avg:97.6 F (36.4 C), Min:97.1 F (36.2 C), Max:97.9 F (36.6 C)   Recent Labs Lab 09/08/16 0637  WBC 7.8  CREATININE 0.71    Estimated Creatinine Clearance: 100.1 mL/min (by C-G formula based on SCr of 0.71 mg/dL).    Allergies  Allergen Reactions  . Aspirin Nausea Only and Other (See Comments)  . Cyclobenzaprine Swelling    Leg swelling, not sure if related to taking cyclobenzaprine.  . Penicillins Hives and Swelling    SWELLING REACTION UNSPECIFIED   Has patient had a PCN reaction causing immediate rash, facial/tongue/throat swelling, SOB or lightheadedness with hypotension: #  #  #  YES  #  #  #  Has patient had a PCN reaction causing severe rash involving mucus membranes or skin necrosis:  #  #  #  UNKNOWN  #  #  #   Has patient had a PCN reaction that required hospitalization:  #  #  #  UNKNOWN  #  #  #  Has patient had a PCN reaction occurring within the last 10 years:  #  #  #  NO  #  #  #    Thank you for allowing pharmacy to be a part of this patient's care.  Nicole Cella, RPh Clinical Pharmacist Pager: [phone] 09/08/2016 12:58 PM

## 2016-09-08 NOTE — H&P (Signed)
CC:  No chief complaint on file.   HPI: Mark Nash is a 61 y.o. male with delayed post-operative pseudomeningocele presents for semi-elective repair.  He fell this morning but did not lose consciousness.  PMH: Past Medical History:  Diagnosis Date  . Arthritis   . Asthma   . Constipation   . COPD (chronic obstructive pulmonary disease) ([place])   . Diabetes mellitus without complication (HCC)    Type 2  . Hepatitis C   . History of kidney stones   . Hyperlipidemia   . Hypertension   . Restless legs   . Schizoaffective disorder ([place])    Pt denies  . Vitamin D deficiency     PSH: Past Surgical History:  Procedure Laterality Date  . HERNIA REPAIR     inguinal  . L3 TO L5 LAMINECTOMY FOR DECOMPRESSION  07/17/2016   [facility]  . SINUSOTOMY      SH: Social History  Substance Use Topics  . Smoking status: Current Every Day Smoker    Packs/day: 1.50    Years: 45.00    Types: Cigarettes  . Smokeless tobacco: Never Used  . Alcohol use No    MEDS: Prior to Admission medications   Medication Sig Start Date End Date Taking? Authorizing Provider  diphenhydramine-acetaminophen (TYLENOL PM) 25-500 MG TABS tablet Take 0.5 tablets by mouth at bedtime as needed (FOR SLEEP).   Yes Historical Provider, MD  gabapentin (NEURONTIN) 300 MG capsule Take 1 capsule by mouth 3 (three) times daily. 05/17/16  Yes Kevan Ny Markeisha Mancias, MD  HYDROcodone-acetaminophen (NORCO) 7.5-325 MG tablet Take 0.5-1 tablets by mouth every 6 (six) hours. Pt is allowed to take medication every 4 to 6 hours as needed 08/09/16  Yes Kevan Ny Thanh Mottern, MD  insulin detemir (LEVEMIR) 100 UNIT/ML injection Inject 0.05 mLs (5 Units total) into the skin at bedtime. Patient taking differently: Inject 5 Units into the skin every evening.  03/21/16  Yes [name], MD  lisinopril (PRINIVIL,ZESTRIL) 20 MG tablet Take 1 tablet (20 mg total) by mouth daily. 08/15/16  Yes [name], MD   methocarbamol (ROBAXIN) 750 MG tablet Take 750 mg by mouth 4 (four) times daily.  07/24/16  Yes Historical Provider, MD  pravastatin (PRAVACHOL) 40 MG tablet Take 1 tablet (40 mg total) by mouth daily. Patient taking differently: Take 20 mg by mouth at bedtime.  08/15/16  Yes [name], MD  Vitamin D, Ergocalciferol, (DRISDOL) 50000 units CAPS capsule TAKE ONE CAPSULE BY MOUTH ONCE A WEEK OR  BIWEKLY  AS  DIRECTED Patient taking differently: TAKE ONE CAPSULE BY MOUTH EVERY 14 DAYS 05/01/16  Yes [name], MD  diclofenac (VOLTAREN) 75 MG EC tablet Take 1 tablet (75 mg total) by mouth 2 (two) times daily. 07/03/16   [name], MD  lubiprostone (AMITIZA) 24 MCG capsule Take 1 capsule (24 mcg total) by mouth 2 (two) times daily with a meal. Patient taking differently: Take 24 mcg by mouth daily with breakfast.  03/21/16   [name], MD    ALLERGY: Allergies  Allergen Reactions  . Aspirin Nausea Only and Other (See Comments)  . Cyclobenzaprine Swelling    Leg swelling, not sure if related to taking cyclobenzaprine.  . Penicillins Hives and Swelling    SWELLING REACTION UNSPECIFIED   Has patient had a PCN reaction causing immediate rash, facial/tongue/throat swelling, SOB or lightheadedness with hypotension: #  #  #  YES  #  #  #  Has patient  had a PCN reaction causing severe rash involving mucus membranes or skin necrosis:  #  #  #  UNKNOWN  #  #  #   Has patient had a PCN reaction that required hospitalization:  #  #  #  UNKNOWN  #  #  #  Has patient had a PCN reaction occurring within the last 10 years:  #  #  #  NO  #  #  #     ROS: ROS  NEUROLOGIC EXAM: Awake, alert, oriented Memory and concentration grossly intact Speech fluent, appropriate CN grossly intact Motor exam: Upper Extremities Deltoid Bicep Tricep Grip  Right 5/5 5/5 5/5 5/5  Left 5/5 5/5 5/5 5/5   Lower Extremity IP Quad PF DF EHL  Right 5/5 5/5 5/5 5/5 5/5  Left 5/5 5/5 5/5 5/5 5/5   Sensation  grossly intact to LT  Abrasions to right cheek and bilateral knees.  Moving legs well, normal ROM.  IMAGING: No new imaging  IMPRESSION: - 61 y.o. male with pseudomeningocele.  PLAN: - Wound exploration, pseudomeningocele repair, possible lumbar drain. - Risks, benefits, and alternatives to discussed.  He understands and wishes to proceed.

## 2016-09-08 NOTE — Anesthesia Postprocedure Evaluation (Signed)
Anesthesia Post Note  Patient: Mark Nash  Procedure(s) Performed: Procedure(s) (LRB): Lumbar wound exploration, repair of pseudomenigocele (N/A)  Patient location during evaluation: PACU Anesthesia Type: General Level of consciousness: sedated Pain management: satisfactory to patient Vital Signs Assessment: post-procedure vital signs reviewed and stable Respiratory status: spontaneous breathing Cardiovascular status: stable Anesthetic complications: no       Last Vitals:  Vitals:   09/08/16 1125 09/08/16 1300  BP: 136/73 127/67  Pulse: 62 (!) 58  Resp: 15 16  Temp: 36.6 C 36.7 C    Last Pain:  Vitals:   09/08/16 1300  TempSrc: Oral  PainSc:                  Riccardo Dubin

## 2016-09-08 NOTE — Anesthesia Procedure Notes (Signed)
Procedure Name: Intubation Date/Time: 09/08/2016 7:40 AM Performed by: Clearnce Sorrel Pre-anesthesia Checklist: Patient identified, Emergency Drugs available, Suction available, Patient being monitored and Timeout performed Patient Re-evaluated:Patient Re-evaluated prior to inductionOxygen Delivery Method: Circle system utilized Preoxygenation: Pre-oxygenation with 100% oxygen Intubation Type: IV induction Ventilation: Mask ventilation without difficulty Laryngoscope Size: Mac and 4 Grade View: Grade II Tube type: Oral Tube size: 7.5 mm Number of attempts: 2 Airway Equipment and Method: Stylet Placement Confirmation: ETT inserted through vocal cords under direct vision,  positive ETCO2 and breath sounds checked- equal and bilateral Secured at: 23 cm Tube secured with: Tape Dental Injury: Teeth and Oropharynx as per pre-operative assessment

## 2016-09-08 NOTE — Progress Notes (Signed)
Patient accepted from PACU, A&O X4. States that he fell this AM at home-stairs. He states that he was trying to get his surgical appointment on time. Abrasion on both knees and right cheek. Will continue to monitor.

## 2016-09-08 NOTE — Transfer of Care (Signed)
Immediate Anesthesia Transfer of Care Note  Patient: Mark Nash  Procedure(s) Performed: Procedure(s) with comments: Lumbar wound exploration, repair of pseudomenigocele (N/A) - Lumbar wound exploration, repair of pseudomenigocele  Patient Location: PACU  Anesthesia Type:General  Level of Consciousness: awake, alert  and oriented  Airway & Oxygen Therapy: Patient Spontanous Breathing and Patient connected to face mask oxygen  Post-op Assessment: Report given to RN  Post vital signs: Reviewed and stable  Last Vitals:  Vitals:   09/08/16 0702  BP: (!) 158/99  Pulse: 69  Resp: 20  Temp: 36.2 C    Last Pain:  Vitals:   09/08/16 0702  TempSrc: Oral  PainSc: 10-Worst pain ever      Patients Stated Pain Goal: 5 ([id number])  Complications: No apparent anesthesia complications

## 2016-09-08 NOTE — Brief Op Note (Signed)
09/08/2016  8:57 AM  PATIENT:  Mark Nash  61 y.o. male  PRE-OPERATIVE DIAGNOSIS:  Acquired Pseudomeningocele  POST-OPERATIVE DIAGNOSIS:  Acquired Pseudomeningocele  PROCEDURE:  Procedure(s) with comments: Lumbar wound exploration, repair of pseudomenigocele (N/A) - Lumbar wound exploration, repair of pseudomenigocele  SURGEON:  Surgeon(s) and Role:    * Tamala Fothergill, MD - Primary  PHYSICIAN ASSISTANT:   ASSISTANTS: Erline Levine, MD  ANESTHESIA:   general  EBL:  No intake/output data recorded.  BLOOD ADMINISTERED:none  DRAINS: none   LOCAL MEDICATIONS USED:  MARCAINE    and LIDOCAINE   SPECIMEN:  No Specimen  DISPOSITION OF SPECIMEN:  N/A  COUNTS:  YES  TOURNIQUET:  * No tourniquets in log *  DICTATION: .Dragon Dictation  PLAN OF CARE: Admit to inpatient   PATIENT DISPOSITION:  PACU - hemodynamically stable.   Delay start of Pharmacological VTE agent (>24hrs) due to surgical blood loss or risk of bleeding: yes

## 2016-09-09 LAB — GLUCOSE, CAPILLARY
GLUCOSE-CAPILLARY: 70 mg/dL (ref 65–99)
GLUCOSE-CAPILLARY: 136 mg/dL — AB (ref 65–99)

## 2016-09-09 MED ORDER — HYDROCODONE-ACETAMINOPHEN 7.5-325 MG PO TABS: 1.0000 | ORAL_TABLET | Freq: Four times a day (QID) | ORAL | 0 refills | Status: DC

## 2016-09-09 NOTE — Progress Notes (Signed)
Patient given discharge instructions.  All questions and concerns addressed. Patient left unit with all belongings.

## 2016-09-09 NOTE — Discharge Instructions (Signed)
Spinal Fusion, Care After Refer to this sheet in the next few weeks. These instructions provide you with information about caring for yourself after your procedure. Your health care provider may also give you more specific instructions. Your treatment has been planned according to current medical practices, but problems sometimes occur. Call your health care provider if you have any problems or questions after your procedure. WHAT TO EXPECT AFTER THE PROCEDURE After your procedure, it is common to have:  Pain and stiffness in your back.  Pain around your incision. HOME CARE INSTRUCTIONS  Medicines  Take over-the-counter and prescription medicines only as told by your health care provider. These include any pain medicines.  Do not drive for 24 hours if you received a sedative.  Do not drive or operate heavy machinery while taking prescription pain medicine.  If you were prescribed an antibiotic medicine, take it as told by your health care provider. Do not stop taking the antibiotic even if you start to feel better. Incision Care  Follow instructions from your health care provider about how to take care of your incision. Make sure you:  Wash your hands with soap and water before you change your bandage (dressing). If soap and water are not available, use hand sanitizer.  Change your dressing as told by your health care provider.  Leave stitches (sutures), skin glue, or adhesive strips in place. These skin closures may need to be in place for 2 weeks or longer. If adhesive strip edges start to loosen and curl up, you may trim the loose edges. Do not remove adhesive strips completely unless your health care provider tells you to do that.  Keep your incision clean and dry. Do not take baths, swim, or use a hot tub until your health care provider approves.  Check your incision and the surrounding area every day for redness, swelling, and leaking fluid. Physical Activity  Return to your  normal activities as told by your health care provider. Ask your health care provider what activities are safe for you. Rest and protect your back as much as possible.  Follow instructions from your health care provider about how to move and use good posture to help your spine heal.  Do not lift anything that is heavier than 8 lb (3.6 kg)or the limit that your health care provider tells you until he or she says that it is safe. Avoid lifting anything over your head.  Do not twist or bend at the waist until your health care provider approves.  Avoid pushing and pulling motions.  Avoid sitting or lying down in the same position for long periods of time.  Do not begin exercising until told by your health care provider. Ask your health care provider what kinds of exercise you can do to make your back stronger. General Instructions  If you were given a brace, use it as told by your health care provider.  Wear compression stockings as told by your health care provider. These stockings help to prevent blood clots and reduce swelling in your legs.  Do not use tobacco products, including cigarettes, chewing tobacco, or e-cigarettes. If you need help quitting, ask your health care provider.  Keep all follow-up visits as told by your health care provider and, if necessary, your physical therapist. This is important. SEEK MEDICAL CARE IF:  You have pain that gets worse or does not get better with medicine.  Your legs or feet become painful or swollen.  You have redness, swelling, or  pain at the site of your incision.  You have fluid, blood, or pus coming from your incision.  You vomit or feel nauseous.  You have weakness or numbness in your legs that is new or getting worse.  You have a fever.  You have trouble controlling urination or bowel movements. SEEK IMMEDIATE MEDICAL CARE IF:   You have severe pain.  You have chest pain.  You have trouble breathing.  You develop a  cough. These symptoms may represent a serious problem that is an emergency. Do not wait to see if the symptoms will go away. Get medical help right away. Call your local emergency services (911 in the [place] Do not drive yourself to the hospital. This information is not intended to replace advice given to you by your health care provider. Make sure you discuss any questions you have with your health care provider. Document Released: 03/03/2005 Document Revised: 12/06/2015 Document Reviewed: 01/27/2015 Elsevier Interactive Patient Education  2017 [employer].

## 2016-09-09 NOTE — Discharge Summary (Signed)
Physician Discharge Summary  Patient ID: Mark Nash MRN: [id number] DOB/AGE: 05-14-1956 61 y.o.  Admit date: 09/08/2016 Discharge date: 09/09/2016  Admission Diagnoses:  Discharge Diagnoses:  Active Problems:   Postprocedural pseudomeningocele   Discharged Condition: good  Hospital Course: Patient admitted to the hospital where he underwent uncomplicated lumbar wound revision. Postoperative use doing well. Back pain well controlled. Still having some right lower extremity discomfort. No new symptoms of numbness or weakness. No wound drainage.  Consults:   Significant Diagnostic Studies:   Treatments:   Discharge Exam: Blood pressure (!) 141/63, pulse 64, temperature 98 F (36.7 C), temperature source Oral, resp. rate 17, weight 73 kg (160 lb 15 oz), SpO2 100 %. Awake and alert. Oriented and appropriate. Motor and sensory function stable. Wound clean and dry. Chest and abdomen benign. Disposition: 01-Home or Self Care   Allergies as of 09/09/2016      Reactions   Aspirin Nausea Only, Other (See Comments)   Cyclobenzaprine Swelling   Leg swelling, not sure if related to taking cyclobenzaprine.   Penicillins Hives, Swelling   SWELLING REACTION UNSPECIFIED  Has patient had a PCN reaction causing immediate rash, facial/tongue/throat swelling, SOB or lightheadedness with hypotension: #  #  #  YES  #  #  #  Has patient had a PCN reaction causing severe rash involving mucus membranes or skin necrosis:  #  #  #  UNKNOWN  #  #  #   Has patient had a PCN reaction that required hospitalization:  #  #  #  UNKNOWN  #  #  #  Has patient had a PCN reaction occurring within the last 10 years:  #  #  #  NO  #  #  #       Medication List    TAKE these medications   diclofenac 75 MG EC tablet Commonly known as:  VOLTAREN Take 1 tablet (75 mg total) by mouth 2 (two) times daily.   diphenhydramine-acetaminophen 25-500 MG Tabs tablet Commonly known as:  TYLENOL PM Take 0.5 tablets by  mouth at bedtime as needed (FOR SLEEP).   gabapentin 300 MG capsule Commonly known as:  NEURONTIN Take 1 capsule by mouth 3 (three) times daily.   HYDROcodone-acetaminophen 7.5-325 MG tablet Commonly known as:  NORCO Take 1-2 tablets by mouth every 6 (six) hours. Pt is allowed to take medication every 4 to 6 hours as needed What changed:  how much to take   insulin detemir 100 UNIT/ML injection Commonly known as:  LEVEMIR Inject 0.05 mLs (5 Units total) into the skin at bedtime. What changed:  when to take this   lisinopril 20 MG tablet Commonly known as:  PRINIVIL,ZESTRIL Take 1 tablet (20 mg total) by mouth daily.   lubiprostone 24 MCG capsule Commonly known as:  AMITIZA Take 1 capsule (24 mcg total) by mouth 2 (two) times daily with a meal. What changed:  when to take this   methocarbamol 750 MG tablet Commonly known as:  ROBAXIN Take 750 mg by mouth 4 (four) times daily.   pravastatin 40 MG tablet Commonly known as:  PRAVACHOL Take 1 tablet (40 mg total) by mouth daily. What changed:  how much to take  when to take this   Vitamin D (Ergocalciferol) 50000 units Caps capsule Commonly known as:  DRISDOL TAKE ONE CAPSULE BY MOUTH ONCE A WEEK OR  BIWEKLY  AS  DIRECTED What changed:  See the new instructions.  Signed: Remigio Mcmillon A 09/09/2016, 10:33 AM

## 2016-09-09 NOTE — Evaluation (Signed)
Occupational Therapy Evaluation/Discharge Patient Details Name: Mark Nash MRN: [id number] DOB: [birth year] Today's Date: 09/09/2016    History of Present Illness 61 y.o. male s/p lumbar wound exploration, repair of pseudomenigocele. PMH significant for HTN, HLD, COPD, asthma, DM type II, hep C, schizoaffective disorder, and arthritis.   Clinical Impression   PTA, pt was independent with ADL and functional mobility. Pt currently requires supervision for LB ADLs and ambulation for safety due to impulsivity. Educated pt on back precautions primarily to protect incision site and pt demonstrated good understanding of these. Reviewed compensatory strategies for LB ADL, activity restrictions and progression, and provided fall prevention education. Pt will have 24/7 assistance from his sister while he is recovering. All education completed and pt has no further questions. No OT follow up required at this time. OT signing off.    Follow Up Recommendations  No OT follow up;Supervision - Intermittent    Equipment Recommendations  None recommended by OT    Recommendations for Other Services       Precautions / Restrictions Precautions Precautions: Fall;Back Restrictions Weight Bearing Restrictions: No      Mobility Bed Mobility Overal bed mobility: Needs Assistance Bed Mobility: Rolling;Supine to Sit Rolling: Supervision   Supine to sit: Supervision     General bed mobility comments: Multiple cues for log roll technique and safety with poor return demonstration. No physical assistance required.  Transfers Overall transfer level: Needs assistance Equipment used: None Transfers: Sit to/from Stand Sit to Stand: Supervision         General transfer comment: Supervision for safety due to pt's mild impulsivity. No physical assistance required.     Balance Overall balance assessment: Needs assistance Sitting-balance support: No upper extremity supported;Feet supported Sitting  balance-Leahy Scale: Good     Standing balance support: No upper extremity supported;During functional activity Standing balance-Leahy Scale: Good                              ADL Overall ADL's : Needs assistance/impaired     Grooming: Wash/dry hands;Wash/dry face;Supervision/safety;Cueing for safety;Standing   Upper Body Bathing: Supervision/ safety;Sitting   Lower Body Bathing: Supervison/ safety;Cueing for compensatory techniques;Sit to/from stand Lower Body Bathing Details (indicate cue type and reason): able to cross ankle-over-knee Upper Body Dressing : Supervision/safety;Sitting   Lower Body Dressing: Supervision/safety;Sit to/from stand;Cueing for compensatory techniques Lower Body Dressing Details (indicate cue type and reason): able to cross ankle-over-knee Toilet Transfer: Supervision/safety;Cueing for safety;Ambulation;Regular Toilet   Toileting- Water quality scientist and Hygiene: Supervision/safety;Sit to/from stand   Tub/ Shower Transfer: Min guard;Cueing for safety;Ambulation   Functional mobility during ADLs: Min guard General ADL Comments: Pt impulsive at times and required multiple cues for safety. Reviewed following back precautions generally primarily to preserve incision site. Educated pt on compensatory strategies and fall prevention.     Vision Vision Assessment?: No apparent visual deficits   Perception     Praxis      Pertinent Vitals/Pain Pain Assessment: 0-10 Pain Score: 9  Pain Location: back/surgical site Pain Descriptors / Indicators: Aching;Sore Pain Intervention(s): Limited activity within patient's tolerance;Monitored during session;Repositioned;Premedicated before session     Hand Dominance Right   Extremity/Trunk Assessment Upper Extremity Assessment Upper Extremity Assessment: Overall WFL for tasks assessed   Lower Extremity Assessment Lower Extremity Assessment: Defer to PT evaluation   Cervical / Trunk  Assessment Cervical / Trunk Assessment: Other exceptions Cervical / Trunk Exceptions: s/p lumbar surgery   Communication Communication  Communication: No difficulties   Cognition Arousal/Alertness: Awake/alert Behavior During Therapy: Impulsive;Flat affect Overall Cognitive Status: Within Functional Limits for tasks assessed                     General Comments       Exercises       Shoulder Instructions      Home Living Family/patient expects to be discharged to:: Private residence Living Arrangements: Alone Available Help at Discharge: Family;Available 24 hours/day (sister will stay with pt) Type of Home: Apartment Home Access: Stairs to enter Entrance Stairs-Number of Steps: 3 Entrance Stairs-Rails: Left Home Layout: One level     Bathroom Shower/Tub: Tub/shower unit Shower/tub characteristics: Curtain Biochemist, clinical: Standard     Home Equipment: Environmental consultant - 2 wheels;Hand held shower head          Prior Functioning/Environment Level of Independence: Independent with assistive device(s)        Comments: Using RW since initial surgery in November 2017        OT Problem List: Decreased strength;Decreased range of motion;Decreased activity tolerance;Impaired balance (sitting and/or standing);Decreased safety awareness;Decreased knowledge of use of DME or AE;Decreased knowledge of precautions;Pain   OT Treatment/Interventions:      OT Goals(Current goals can be found in the care plan section) Acute Rehab OT Goals Patient Stated Goal: to go home today OT Goal Formulation: With patient Time For Goal Achievement: 09/23/16 Potential to Achieve Goals: Good  OT Frequency:     Barriers to D/C:            Co-evaluation              End of Session Equipment Utilized During Treatment: Gait belt Nurse Communication: Mobility status  Activity Tolerance: Patient tolerated treatment well Patient left: in chair;with call bell/phone within reach;with  chair alarm set   Time: [phone] OT Time Calculation (min): 16 min Charges:  OT General Charges $OT Visit: 1 Procedure OT Evaluation $OT Eval Moderate Complexity: 1 Procedure G-Codes:    Redmond Baseman, MS, OTR/L 09/09/2016, 9:44 AM

## 2016-09-09 NOTE — Evaluation (Signed)
Physical Therapy Evaluation Patient Details Name: Mark Nash MRN: [id number] DOB: May 07, 1956 Today's Date: 09/09/2016   History of Present Illness  61 y.o. male s/p lumbar wound exploration, repair of pseudomenigocele. PMH significant for HTN, HLD, COPD, asthma, DM type II, hep C, schizoaffective disorder, and arthritis.  Clinical Impression  Patient seen for mobility assessment s/p spinal surgery. Mobilizing well, tolerated increased ambulation, functional task performance, bed mobility and stair negotiation without difficulty. Educated regarding back precautions, no further acute PT needs at this time. Will sign off.    Follow Up Recommendations No PT follow up    Equipment Recommendations  None recommended by PT    Recommendations for Other Services       Precautions / Restrictions Precautions Precautions: Fall;Back Precaution Comments: reviewed with patient Restrictions Weight Bearing Restrictions: No      Mobility  Bed Mobility Overal bed mobility: Needs Assistance Bed Mobility: Rolling;Supine to Sit Rolling: Supervision   Supine to sit: Supervision     General bed mobility comments: Multiple cues for log roll technique and safety with poor return demonstration. No physical assistance required.  Transfers Overall transfer level: Needs assistance Equipment used: None Transfers: Sit to/from Stand Sit to Stand: Supervision         General transfer comment: Supervision for safety due to pt's mild impulsivity. No physical assistance required.   Ambulation/Gait Ambulation/Gait assistance: Supervision Ambulation Distance (Feet): 410 Feet Assistive device: None Gait Pattern/deviations: WFL(Within Functional Limits)   Gait velocity interpretation: at or above normal speed for age/gender General Gait Details: steady with ambulation  Stairs Stairs: Yes Stairs assistance: Supervision Stair Management: One rail Left Number of Stairs: 6 General stair  comments: step to technique  Wheelchair Mobility    Modified Rankin (Stroke Patients Only)       Balance Overall balance assessment: Needs assistance Sitting-balance support: No upper extremity supported;Feet supported Sitting balance-Leahy Scale: Good     Standing balance support: No upper extremity supported;During functional activity Standing balance-Leahy Scale: Good                               Pertinent Vitals/Pain Pain Assessment: 0-10 Pain Score: 9  Pain Location: back/surgical site Pain Descriptors / Indicators: Aching;Sore Pain Intervention(s): Monitored during session    Home Living Family/patient expects to be discharged to:: Private residence Living Arrangements: Alone Available Help at Discharge: Family;Available 24 hours/day (sister will stay with pt) Type of Home: Apartment Home Access: Stairs to enter Entrance Stairs-Rails: Left Entrance Stairs-Number of Steps: 3 Home Layout: One level Home Equipment: Walker - 2 wheels;Hand held shower head      Prior Function Level of Independence: Independent with assistive device(s)         Comments: Using RW since initial surgery in November 2017     Hand Dominance   Dominant Hand: Right    Extremity/Trunk Assessment   Upper Extremity Assessment Upper Extremity Assessment: Overall WFL for tasks assessed    Lower Extremity Assessment Lower Extremity Assessment: Overall WFL for tasks assessed    Cervical / Trunk Assessment Cervical / Trunk Assessment: Other exceptions Cervical / Trunk Exceptions: s/p lumbar surgery  Communication   Communication: No difficulties  Cognition Arousal/Alertness: Awake/alert Behavior During Therapy: Impulsive;Flat affect Overall Cognitive Status: Within Functional Limits for tasks assessed                      General Comments General comments (skin integrity,  edema, etc.): noted abrasions over face and extremities    Exercises      Assessment/Plan    PT Assessment Patent does not need any further PT services  PT Problem List            PT Treatment Interventions      PT Goals (Current goals can be found in the Care Plan section)  Acute Rehab PT Goals Patient Stated Goal: to go home today PT Goal Formulation: All assessment and education complete, DC therapy    Frequency     Barriers to discharge        Co-evaluation               End of Session   Activity Tolerance: Patient tolerated treatment well Patient left: in bed;with bed alarm set;with call bell/phone within reach Nurse Communication: Mobility status    Functional Assessment Tool Used: clinical judgement Functional Limitation: Mobility: Walking and moving around Mobility: Walking and Moving Around Current Status [id number]): At least 1 percent but less than 20 percent impaired, limited or restricted Mobility: Walking and Moving Around Goal Status [phone]): At least 1 percent but less than 20 percent impaired, limited or restricted Mobility: Walking and Moving Around Discharge Status [phone]): At least 1 percent but less than 20 percent impaired, limited or restricted    Time: [id number] PT Time Calculation (min) (ACUTE ONLY): 16 min   Charges:   PT Evaluation $PT Eval Low Complexity: 1 Procedure     PT G Codes:   PT G-Codes **NOT FOR INPATIENT CLASS** Functional Assessment Tool Used: clinical judgement Functional Limitation: Mobility: Walking and moving around Mobility: Walking and Moving Around Current Status [id number]): At least 1 percent but less than 20 percent impaired, limited or restricted Mobility: Walking and Moving Around Goal Status [phone]): At least 1 percent but less than 20 percent impaired, limited or restricted Mobility: Walking and Moving Around Discharge Status [phone]): At least 1 percent but less than 20 percent impaired, limited or restricted    Duncan Dull 09/09/2016, 11:20 AM Alben Deeds, PT DPT   [phone]

## 2016-09-11 ENCOUNTER — Encounter: Payer: Self-pay | Admitting: Family Medicine

## 2016-09-11 ENCOUNTER — Encounter (HOSPITAL_COMMUNITY): Payer: Self-pay | Admitting: Neurological Surgery

## 2016-09-12 MED FILL — Thrombin For Soln 5000 Unit: CUTANEOUS | Qty: 5000 | Status: AC

## 2016-09-13 ENCOUNTER — Other Ambulatory Visit: Payer: Self-pay | Admitting: Family Medicine

## 2016-09-13 DIAGNOSIS — I10 Essential (primary) hypertension: Secondary | ICD-10-CM

## 2016-11-19 ENCOUNTER — Other Ambulatory Visit: Payer: Self-pay | Admitting: Family Medicine

## 2016-12-11 ENCOUNTER — Ambulatory Visit: Payer: PPO | Attending: Physician Assistant | Admitting: Physical Therapy

## 2016-12-11 DIAGNOSIS — M6281 Muscle weakness (generalized): Secondary | ICD-10-CM | POA: Insufficient documentation

## 2016-12-11 DIAGNOSIS — G8929 Other chronic pain: Secondary | ICD-10-CM | POA: Diagnosis not present

## 2016-12-11 DIAGNOSIS — R262 Difficulty in walking, not elsewhere classified: Secondary | ICD-10-CM | POA: Insufficient documentation

## 2016-12-11 DIAGNOSIS — M545 Low back pain, unspecified: Secondary | ICD-10-CM

## 2016-12-11 NOTE — Patient Instructions (Signed)
Lumbar extension - reduced 50% and painful   R rotation/ L rotation limited 50-75% and painful (more stiffness)  Side flexion L>R regarding pain, range appropriate   Trunk flexion -- he is able to touch his toes, but feels stiffness/pain on the way down   Gait observation -- reduced hip extension bilaterally noted, flat lumbar spine.   5x sit to stand - 16.65 seconds without his hands (appropriate strength noted)   Reflexes- could not find bilateral patellar  Ankle on R - 2+, absent on L   Slump test positive on LLE, mild positive on RLE  SLR positive bilaterally R>L for pain in the back   RLE Pain and restricted to ~ 90-100 degrees of flexion at hip 90-90 lacking 40-50 degrees (no pain) Hip ER - limited 25-50%, IR limited to 0-5 degrees  (Similar findings in LLE)  Prone Ely's stretching with hot pack on lumbar spine -- repeated gait and 5x sit to stand   90-90 HS stretch for HEP

## 2016-12-12 NOTE — Therapy (Signed)
[facility] [address] [place], [address] Phone: [phone]   Fax:  [phone]  Physical Therapy Evaluation  Patient Details  Name: Mark Nash MRN: [id number] Date of Birth: [birth year] No Data Recorded  Encounter Date: 12/11/2016      PT End of Session - 12/11/16 1118    Visit Number 1   Number of Visits 13   Date for PT Re-Evaluation 02/05/17   PT Start Time 1107   PT Stop Time 1157   PT Time Calculation (min) 50 min   Activity Tolerance Patient tolerated treatment well   Behavior During Therapy [facility] for tasks assessed/performed      Past Medical History:  Diagnosis Date  . Arthritis   . Asthma   . Constipation   . COPD (chronic obstructive pulmonary disease) ([place])   . Diabetes mellitus without complication (HCC)    Type 2  . Hepatitis C   . History of kidney stones   . Hyperlipidemia   . Hypertension   . Restless legs   . Schizoaffective disorder ([place])    Pt denies  . Vitamin D deficiency     Past Surgical History:  Procedure Laterality Date  . HERNIA REPAIR     inguinal  . L3 TO L5 LAMINECTOMY FOR DECOMPRESSION  07/17/2016   [facility]  . REPAIR OF CEREBROSPINAL FLUID LEAK N/A 09/08/2016   Procedure: Lumbar wound exploration, repair of pseudomenigocele;  Surgeon: Kevan Ny Ditty, MD;  Location: [facility];  Service: Neurosurgery;  Laterality: N/A;  Lumbar wound exploration, repair of pseudomenigocele  . SINUSOTOMY      There were no vitals filed for this visit.       Subjective Assessment - 12/11/16 1110    Subjective Patient reports he has had a long history of lower back pain, with progression of pain over time. He had a decompression and laminectomy in November, found to have a "delayed" pseudomeningocele which required a follow up operation. Reports he used to walk at least 1x per week, but now if he ambulates he will feel a burning in his legs for  a while afterwards. He reports muscle relaxers has helped his back pain, but now his LEs feel swollen and a "burning" sensation. He reports he has RLE numbness occasionally, will get this with walking. Patient reports a history of diabetes and neuropathy in his feet. Reports he has gained weight recently, denies difficulty with bathroom.    Limitations Sitting;Lifting;Standing;Walking   Diagnostic tests MRI   Patient Stated Goals Get rid of the stiffness in his back with washing dishes, be able to go out and walk more than 1x per week without burning sensation in his LEs.    Currently in Pain? Yes   Pain Score 6    Pain Location Back   Pain Orientation Lower   Pain Descriptors / Indicators Aching   Pain Type Chronic pain;Surgical pain   Pain Onset More than a month ago   Pain Frequency Constant   Aggravating Factors  Increased ambulation    Pain Relieving Factors Resting, muscle relaxers, heating pad.       Lumbar extension - reduced 50% and painful   R rotation/ L rotation limited 50-75% and painful (more stiffness)  Side flexion L>R regarding pain, range appropriate   Trunk flexion -- he is able to touch his toes, but feels stiffness/pain on the way down   Gait observation -- reduced hip extension  bilaterally noted, flat lumbar spine.   5x sit to stand - 16.65 seconds without his hands (appropriate strength noted)   Reflexes- could not find bilateral patellar  Ankle on R - 2+, absent on L   Slump test positive on LLE, mild positive on RLE  SLR positive bilaterally R>L for pain in the back   RLE Pain and restricted to ~ 90-100 degrees of flexion at hip 90-90 lacking 40-50 degrees (no pain) Hip ER - limited 25-50%, IR limited to 0-5 degrees  (Similar findings in LLE)  Prone Ely's stretching with hot pack on lumbar spine -- repeated gait and 5x sit to stand   90-90 HS stretch for HEP                          PT Education - 12/11/16 1121    Education  provided Yes   Education Details Patient has severe range of motion restrictions, would benefit from stretching routine multiple times daily to increase his flexibility and reduce his symptoms.    Person(s) Educated Patient   Methods Explanation;Demonstration;Handout   Comprehension Verbalized understanding;Returned demonstration             PT Long Term Goals - 12/11/16 1243      PT LONG TERM GOAL #1   Title Patient will report worst back pain of 3/10 to demonstrate improved tolerance for ADLs.    Time 8   Period Weeks   Status New     PT LONG TERM GOAL #2   Title Patient will perform 6MW test and ambulate > 1,000' with no increase in leg or low back pain to demonstrate improved tolerance for community mobility.    Time 8   Period Weeks   Status New     PT LONG TERM GOAL #3   Title Patient will perform 5x sit to stand in < 13 seconds with no increase in LE pain to demonstrate improved tolerance for ADLs.    Baseline 16.65 seconds with increase in leg pain bilaterally    Time 8   Period Weeks   Status New     PT LONG TERM GOAL #4   Title Patient will report modified ODI of less than 16% disability to demonstrate improved tolerance for ADLs.    Baseline 26%   Time 8   Period Weeks   Status New               Plan - 12/11/16 1246    Clinical Impression Statement Patient presents with chronic progressive low back pain, with 2 operations on lumbar spine in the previous 6 months. He demonstrates significant range of motion deficits in his LEs, likely requiring increased mobility from lumbar spine. He demonstrates reasonable strength levels, though poor NM dissociation of hip vs lumbar extension. He symptoms appear to be tied significantly to his lack of ROM in his LEs and would benefit from skilled PT services to address ROM deficits for ability to perform more community mobility.    Rehab Potential Fair   PT Frequency 2x / week   PT Duration 4 weeks   PT  Treatment/Interventions Aquatic Therapy;Electrical Stimulation;Ultrasound;Moist Heat;Iontophoresis 4mg /ml Dexamethasone;Gait training;Stair training;Manual techniques;Therapeutic exercise;Therapeutic activities;Balance training;Cryotherapy;Neuromuscular re-education   PT Next Visit Plan Perform 6MW test, increase HEP    PT Home Exercise Plan Supine bridging, hamstring stretching, quad stretching    Consulted and Agree with Plan of Care Patient      Patient will benefit from  skilled therapeutic intervention in order to improve the following deficits and impairments:  Pain, Cardiopulmonary status limiting activity, Decreased activity tolerance, Difficulty walking, Decreased strength, Decreased range of motion, Hypomobility, Impaired flexibility, Decreased mobility, Impaired sensation  Visit Diagnosis: Chronic midline low back pain without sciatica - Plan: PT plan of care cert/re-cert  Difficulty in walking, not elsewhere classified - Plan: PT plan of care cert/re-cert  Muscle weakness (generalized) - Plan: PT plan of care cert/re-cert      G-Codes - 12/45/80 1242    Functional Assessment Tool Used (Outpatient Only) modified ODI, patient report, 5x sit to stand    Functional Limitation Mobility: Walking and moving around   Mobility: Walking and Moving Around Current Status [phone]) At least 20 percent but less than 40 percent impaired, limited or restricted   Mobility: Walking and Moving Around Goal Status [phone]) At least 1 percent but less than 20 percent impaired, limited or restricted       Problem List Patient Active Problem List   Diagnosis Date Noted  . 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 Lymphedema 07/07/2016  . Hepatitis C 06/01/2016  . Lumbar herniated disc 11/24/2015  . Protein malnutrition ([place]) 11/11/2015  . Thrombocytopenia ([place]) 11/11/2015  . Hepatitis C antibody test positive 07/13/2015  . Chronic constipation 07/13/2015  . Abnormal immunological findings  in specimens from other organs, systems and tissues 07/13/2015  . CN (constipation) 07/13/2015  . Benign hypertension 03/08/2015  . COPD, mild ([place]) 03/08/2015  . Dyslipidemia 03/08/2015  . Paranoid schizophrenia ([place]) 03/08/2015  . Acquired polyneuropathy 03/08/2015  . Restless leg 03/08/2015  . Tobacco abuse 03/08/2015  . Callus of foot 03/08/2015  . Claudication ([place]) 03/08/2015  . Chronic obstructive pulmonary disease ([place]) 03/08/2015  . Corn or callus 03/08/2015  . Essential (primary) hypertension 03/08/2015  . HLD (hyperlipidemia) 03/08/2015  . Peripheral vascular disease ([place]) 03/08/2015  . Polyneuropathy 03/08/2015  . Current tobacco use 03/08/2015  . Vitamin D deficiency 04/26/2009  . Avitaminosis D 04/26/2009   Royce Macadamia PT, DPT, CSCS     12/12/2016, 1:01 PM  [facility] [address], [place], [address] Phone: [phone]   Fax:  [phone]  Name: MATAEO INGWERSEN MRN: [id number] Date of Birth: [birth year]

## 2016-12-14 ENCOUNTER — Ambulatory Visit: Payer: PPO | Admitting: Physical Therapy

## 2016-12-14 DIAGNOSIS — R262 Difficulty in walking, not elsewhere classified: Secondary | ICD-10-CM

## 2016-12-14 DIAGNOSIS — M6281 Muscle weakness (generalized): Secondary | ICD-10-CM

## 2016-12-14 DIAGNOSIS — G8929 Other chronic pain: Secondary | ICD-10-CM

## 2016-12-14 DIAGNOSIS — M545 Low back pain: Principal | ICD-10-CM

## 2016-12-14 NOTE — Therapy (Signed)
[facility] [address] [place], [address] Phone: [phone]   Fax:  [phone]  Physical Therapy Treatment  Patient Details  Name: Mark Nash MRN: [id number] Date of Birth: 04-05-1956 No Data Recorded  Encounter Date: 12/14/2016      PT End of Session - 12/14/16 1453    Visit Number 2   Number of Visits 13   Date for PT Re-Evaluation 02/05/17   PT Start Time 1430   PT Stop Time 1509   PT Time Calculation (min) 39 min   Activity Tolerance Patient tolerated treatment well   Behavior During Therapy [facility] for tasks assessed/performed      Past Medical History:  Diagnosis Date  . Arthritis   . Asthma   . Constipation   . COPD (chronic obstructive pulmonary disease) ([place])   . Diabetes mellitus without complication (HCC)    Type 2  . Hepatitis C   . History of kidney stones   . Hyperlipidemia   . Hypertension   . Restless legs   . Schizoaffective disorder ([place])    Pt denies  . Vitamin D deficiency     Past Surgical History:  Procedure Laterality Date  . HERNIA REPAIR     inguinal  . L3 TO L5 LAMINECTOMY FOR DECOMPRESSION  07/17/2016   [facility]  . REPAIR OF CEREBROSPINAL FLUID LEAK N/A 09/08/2016   Procedure: Lumbar wound exploration, repair of pseudomenigocele;  Surgeon: Kevan Ny Ditty, MD;  Location: [facility];  Service: Neurosurgery;  Laterality: N/A;  Lumbar wound exploration, repair of pseudomenigocele  . SINUSOTOMY      There were no vitals filed for this visit.      Subjective Assessment - 12/14/16 1443    Subjective Patient reports he was pretty sore after last PT session, but has tried his stretching program which he reports made him feel better and "looser".    Limitations Sitting;Lifting;Standing;Walking   Diagnostic tests MRI   Patient Stated Goals Get rid of the stiffness in his back with washing dishes, be able to go out and walk more than 1x  per week without burning sensation in his LEs.    Currently in Pain? Yes   Pain Score 6    Pain Location Back   Pain Orientation Posterior;Lower;Medial   Pain Descriptors / Indicators Aching   Pain Type Chronic pain;Surgical pain   Pain Onset More than a month ago   Pain Frequency Intermittent     6MW test - 1,500' with no rest breaks, no significant increase in low back pain   Quad stretching in Ely's x 10 repetitions per side for 10" holds -- well tolerated, mild increase in ROM past 90 degrees now from previous session   HS stretching -- 8 bouts of 15" holds in 90-90 position to tolerance   Piriformis stretching -- 6 bouts per LE x 15-30" holds (reported no increase in pain with these   Supine Bridging -- initially felt in lumbar extensors, cued to push through heels and squeeze gluteals which increased posterior chain activity. 2 sets x 10 repetitions  Sidelying clamshells with red t-band -- 1 set x 10 repetitions, cuing for trunk position.                            PT Education - 12/14/16 1453    Education provided Yes   Education Details Stretch before going for  a walk this weekend.    Person(s) Educated Patient   Methods Explanation;Demonstration;Handout   Comprehension Verbalized understanding;Returned demonstration             PT Long Term Goals - 12/11/16 1243      PT LONG TERM GOAL #1   Title Patient will report worst back pain of 3/10 to demonstrate improved tolerance for ADLs.    Time 8   Period Weeks   Status New     PT LONG TERM GOAL #2   Title Patient will perform 6MW test and ambulate > 1,000' with no increase in leg or low back pain to demonstrate improved tolerance for community mobility.    Time 8   Period Weeks   Status New     PT LONG TERM GOAL #3   Title Patient will perform 5x sit to stand in < 13 seconds with no increase in LE pain to demonstrate improved tolerance for ADLs.    Baseline 16.65 seconds with increase  in leg pain bilaterally    Time 8   Period Weeks   Status New     PT LONG TERM GOAL #4   Title Patient will report modified ODI of less than 16% disability to demonstrate improved tolerance for ADLs.    Baseline 26%   Time 8   Period Weeks   Status New               Plan - 12/14/16 1512    Clinical Impression Statement Patient demonstrates improved ROM (though quite limited still) at the knee and hip joint this date. His 6MW test time indicates he is appropriate for community mobility. He does appear to have significant posterior chain weakness based on tolerance for light resistance activities. Instructed patient to stretch and walk over the weekend to gain a sense of tolerance.    Rehab Potential Fair   PT Frequency 2x / week   PT Duration 4 weeks   PT Treatment/Interventions Aquatic Therapy;Electrical Stimulation;Ultrasound;Moist Heat;Iontophoresis 4mg /ml Dexamethasone;Gait training;Stair training;Manual techniques;Therapeutic exercise;Therapeutic activities;Balance training;Cryotherapy;Neuromuscular re-education   PT Next Visit Plan Perform 6MW test, increase HEP    PT Home Exercise Plan Supine bridging, hamstring stretching, quad stretching    Consulted and Agree with Plan of Care Patient      Patient will benefit from skilled therapeutic intervention in order to improve the following deficits and impairments:  Pain, Cardiopulmonary status limiting activity, Decreased activity tolerance, Difficulty walking, Decreased strength, Decreased range of motion, Hypomobility, Impaired flexibility, Decreased mobility, Impaired sensation  Visit Diagnosis: Chronic midline low back pain without sciatica  Difficulty in walking, not elsewhere classified  Muscle weakness (generalized)     Problem List Patient Active Problem List   Diagnosis Date Noted  . 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 Lymphedema 07/07/2016  . Hepatitis C 06/01/2016  . Lumbar herniated disc  11/24/2015  . Protein malnutrition ([place]) 11/11/2015  . Thrombocytopenia ([place]) 11/11/2015  . Hepatitis C antibody test positive 07/13/2015  . Chronic constipation 07/13/2015  . Abnormal immunological findings in specimens from other organs, systems and tissues 07/13/2015  . CN (constipation) 07/13/2015  . Benign hypertension 03/08/2015  . COPD, mild ([place]) 03/08/2015  . Dyslipidemia 03/08/2015  . Paranoid schizophrenia ([place]) 03/08/2015  . Acquired polyneuropathy 03/08/2015  . Restless leg 03/08/2015  . Tobacco abuse 03/08/2015  . Callus of foot 03/08/2015  . Claudication ([place]) 03/08/2015  . Chronic obstructive pulmonary disease ([place]) 03/08/2015  . Corn or callus 03/08/2015  . Essential (primary) hypertension 03/08/2015  .  HLD (hyperlipidemia) 03/08/2015  . Peripheral vascular disease ([place]) 03/08/2015  . Polyneuropathy 03/08/2015  . Current tobacco use 03/08/2015  . Vitamin D deficiency 04/26/2009  . Avitaminosis D 04/26/2009   Royce Macadamia PT, DPT, CSCS    12/14/2016, 3:24 PM  [facility] [address] [place], [address] Phone: [phone]   Fax:  [phone]  Name: Mark Nash MRN: [id number] Date of Birth: 1955/09/08

## 2016-12-15 ENCOUNTER — Encounter: Payer: Self-pay | Admitting: Family Medicine

## 2016-12-15 ENCOUNTER — Ambulatory Visit (INDEPENDENT_AMBULATORY_CARE_PROVIDER_SITE_OTHER): Payer: PPO | Admitting: Family Medicine

## 2016-12-15 VITALS — BP 142/78 | HR 81 | Temp 97.5°F | Resp 16 | Ht 71.0 in | Wt 166.6 lb

## 2016-12-15 DIAGNOSIS — E441 Mild protein-calorie malnutrition: Secondary | ICD-10-CM | POA: Diagnosis not present

## 2016-12-15 DIAGNOSIS — R600 Localized edema: Secondary | ICD-10-CM | POA: Diagnosis not present

## 2016-12-15 DIAGNOSIS — F200 Paranoid schizophrenia: Secondary | ICD-10-CM | POA: Diagnosis not present

## 2016-12-15 DIAGNOSIS — E1140 Type 2 diabetes mellitus with diabetic neuropathy, unspecified: Secondary | ICD-10-CM | POA: Diagnosis not present

## 2016-12-15 DIAGNOSIS — I739 Peripheral vascular disease, unspecified: Secondary | ICD-10-CM

## 2016-12-15 DIAGNOSIS — E559 Vitamin D deficiency, unspecified: Secondary | ICD-10-CM

## 2016-12-15 DIAGNOSIS — E785 Hyperlipidemia, unspecified: Secondary | ICD-10-CM | POA: Diagnosis not present

## 2016-12-15 DIAGNOSIS — J449 Chronic obstructive pulmonary disease, unspecified: Secondary | ICD-10-CM | POA: Diagnosis not present

## 2016-12-15 DIAGNOSIS — I10 Essential (primary) hypertension: Secondary | ICD-10-CM | POA: Diagnosis not present

## 2016-12-15 DIAGNOSIS — B182 Chronic viral hepatitis C: Secondary | ICD-10-CM | POA: Diagnosis not present

## 2016-12-15 DIAGNOSIS — D692 Other nonthrombocytopenic purpura: Secondary | ICD-10-CM

## 2016-12-15 DIAGNOSIS — K5909 Other constipation: Secondary | ICD-10-CM

## 2016-12-15 DIAGNOSIS — R109 Unspecified abdominal pain: Secondary | ICD-10-CM

## 2016-12-15 DIAGNOSIS — Z9889 Other specified postprocedural states: Secondary | ICD-10-CM

## 2016-12-15 LAB — COMPLETE METABOLIC PANEL WITH GFR
ALBUMIN: 4.3 g/dL (ref 3.6–5.1)
ALK PHOS: 107 U/L (ref 40–115)
ALT: 26 U/L (ref 9–46)
AST: 21 U/L (ref 10–35)
BUN: 17 mg/dL (ref 7–25)
CALCIUM: 9.2 mg/dL (ref 8.6–10.3)
CO2: 28 mmol/L (ref 20–31)
Chloride: 102 mmol/L (ref 98–110)
Creat: 0.78 mg/dL (ref 0.70–1.25)
GFR, Est African American: >89 mL/min (ref >=60)
Glucose, Bld: 94 mg/dL (ref 65–99)
POTASSIUM: 4.5 mmol/L (ref 3.5–5.3)
Sodium: 139 mmol/L (ref 135–146)
Total Bilirubin: 1.1 mg/dL (ref 0.2–1.2)
Total Protein: 6.4 g/dL (ref 6.1–8.1)

## 2016-12-15 LAB — LIPID PANEL
Cholesterol: 110 mg/dL (ref <200)
HDL: 43 mg/dL (ref >40)
LDL Cholesterol: 55 mg/dL (ref <100)
TRIGLYCERIDES: 58 mg/dL (ref <150)
Total CHOL/HDL Ratio: 2.6 Ratio (ref <5.0)
VLDL: 12 mg/dL (ref <30)

## 2016-12-15 LAB — CBC WITH DIFFERENTIAL/PLATELET
BASOS PCT: 0 %
Basophils Absolute: 0 cells/uL (ref 0–200)
EOS PCT: 2 %
Eosinophils Absolute: 120 cells/uL (ref 15–500)
HCT: 45.1 % (ref 38.5–50.0)
HEMOGLOBIN: 14.9 g/dL (ref 13.2–17.1)
LYMPHS ABS: 2220 {cells}/uL (ref 850–3900)
Lymphocytes Relative: 37 %
MCH: 29.6 pg (ref 27.0–33.0)
MCHC: 33 g/dL (ref 32.0–36.0)
MCV: 89.7 fL (ref 80.0–100.0)
MONO ABS: 480 {cells}/uL (ref 200–950)
MPV: 9.2 fL (ref 7.5–12.5)
Monocytes Relative: 8 %
NEUTROS ABS: 3180 {cells}/uL (ref 1500–7800)
Neutrophils Relative %: 53 %
Platelets: 154 10*3/uL (ref 140–400)
RBC: 5.03 MIL/uL (ref 4.20–5.80)
RDW: 13.8 % (ref 11.0–15.0)
WBC: 6 10*3/uL (ref 3.8–10.8)

## 2016-12-15 NOTE — Progress Notes (Signed)
Name: Mark Nash   MRN: [id number]    DOB: 05-28-1956   Date:12/15/2016       Progress Note  Subjective  Chief Complaint  Chief Complaint  Patient presents with  . Diabetes    54month follow up    HPI  DMII: he was advised to stop using Levemir, but he continues to take 5 units daily , hgbA1C was 6.8% but went down to 6.0 and is at 6.3%  He denies polydipsia, polyuria or polyphagia, no recent episodes of hypoglycemia. He is on ace, and has neuropathy, he is on high dose gabapentin and states burning sensation on his feet has not been severe lalety. He states his feet pain is described as burning when he walks for a long time.   Lower extremity edema and history of claudication: symptoms of leg swelling since the end of January 2017 seen by vascular surgeon, per patient studies negative and swelling has been down lately. He still has some intermittent claudication but seems to be from neuropathic pain. Compression stocking hoses are very helpful, off diuretics   COPD: he is not on medication, because he refuses, he had daily productive cough in am's but improved significantly. He has intermittent  SOB with activity, he is still smoking about 1 and half pack of cigarettes daily but not ready to quit smoking yet.   Hyperlipidemia: taking Pravastatin, only half pill at night, we will recheck labs. No myalgias   HTN: taking bp medication and denies side effects,bp is at goal, no chest pain or palpitation.  Schizophrenia: denies any hallucinations, no depressed mood or suicidal thoughts or ideation, feeling well, not on medication and refuses to see Psychiatrist.  No changes  Hepatitis C antibody positive: he has a remote history of drug use from age 54 till 27. Used injectable drug use at the time, he has positive hepatitis C but refuses to see GI .  Chronic Constipation: he has intermittent periods of constipation. [place] was 1-2, dry and had to strain,  He has taking Amitiza 24  mcg once  daily since July and has been having bowel movements 4-5 days a week, no incontinence.  Chronic intermittent back pain:. He went to see Dr. Cyndy Freeze and had laminectomy   07/17/2016 , he is still taking hydrocodone for pain, but down to half pill daily, on higher dose Neurontin and tolerating well, radiculitis has resolved on right leg.   Abdominal pain: he refuses a colonoscopy but has intermittent pain on LLQ, feels like something is stuck, no blood in stools, no nausea or vomiting, but refuses to see GI, he states he will let me know if it gets worse   Patient Active Problem List   Diagnosis Date Noted  . 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 Lymphedema 07/07/2016  . Hepatitis C 06/01/2016  . Lumbar herniated disc 11/24/2015  . Protein malnutrition ([place]) 11/11/2015  . Thrombocytopenia ([place]) 11/11/2015  . Hepatitis C antibody test positive 07/13/2015  . Chronic constipation 07/13/2015  . Abnormal immunological findings in specimens from other organs, systems and tissues 07/13/2015  . CN (constipation) 07/13/2015  . Benign hypertension 03/08/2015  . COPD, mild ([place]) 03/08/2015  . Dyslipidemia 03/08/2015  . Paranoid schizophrenia ([place]) 03/08/2015  . Acquired polyneuropathy 03/08/2015  . Restless leg 03/08/2015  . Tobacco abuse 03/08/2015  . Callus of foot 03/08/2015  . Claudication ([place]) 03/08/2015  . Chronic obstructive pulmonary disease ([place]) 03/08/2015  . Corn or callus 03/08/2015  . Essential (primary) hypertension 03/08/2015  .  HLD (hyperlipidemia) 03/08/2015  . Peripheral vascular disease ([place]) 03/08/2015  . Polyneuropathy 03/08/2015  . Current tobacco use 03/08/2015  . Vitamin D deficiency 04/26/2009  . Avitaminosis D 04/26/2009    Past Surgical History:  Procedure Laterality Date  . HERNIA REPAIR     inguinal  . L3 TO L5 LAMINECTOMY FOR DECOMPRESSION  07/17/2016   [facility]  . REPAIR OF CEREBROSPINAL FLUID LEAK N/A 09/08/2016    Procedure: Lumbar wound exploration, repair of pseudomenigocele;  Surgeon: Kevan Ny Ditty, MD;  Location: [facility];  Service: Neurosurgery;  Laterality: N/A;  Lumbar wound exploration, repair of pseudomenigocele  . SINUSOTOMY      Family History  Problem Relation Age of Onset  . Hypertension Mother   . Diabetes Father   . Hypertension Father   . Heart disease Father   . Diverticulitis Brother     Social History   Social History  . Marital status: Single    Spouse name: N/A  . Number of children: N/A  . Years of education: N/A   Occupational History  . Not on file.   Social History Main Topics  . Smoking status: Current Every Day Smoker    Packs/day: 1.50    Years: 45.00    Types: Cigarettes  . Smokeless tobacco: Never Used  . Alcohol use No  . Drug use: No  . Sexual activity: Not Currently   Other Topics Concern  . Not on file   Social History Narrative  . No narrative on file     Current Outpatient Prescriptions:  .  AMITIZA 24 MCG capsule, TAKE ONE CAPSULE BY MOUTH TWICE DAILY WITH MEALS, Disp: 60 capsule, Rfl: 5 .  cyclobenzaprine (FLEXERIL) 10 MG tablet, , Disp: , Rfl:  .  diclofenac (VOLTAREN) 75 MG EC tablet, Take 1 tablet (75 mg total) by mouth 2 (two) times daily., Disp: , Rfl:  .  diphenhydramine-acetaminophen (TYLENOL PM) 25-500 MG TABS tablet, Take 0.5 tablets by mouth at bedtime as needed (FOR SLEEP)., Disp: , Rfl:  .  gabapentin (NEURONTIN) 300 MG capsule, Take 2 capsules by mouth 3 (three) times daily., Disp: , Rfl:  .  HYDROcodone-acetaminophen (NORCO) 7.5-325 MG tablet, Take 1-2 tablets by mouth every 6 (six) hours. Pt is allowed to take medication every 4 to 6 hours as needed, Disp: 50 tablet, Rfl: 0 .  insulin detemir (LEVEMIR) 100 UNIT/ML injection, Inject 0.05 mLs (5 Units total) into the skin at bedtime. (Patient taking differently: Inject 5 Units into the skin every evening. ), Disp: 15 mL, Rfl: 2 .  lisinopril (PRINIVIL,ZESTRIL) 20 MG  tablet, TAKE ONE TABLET BY MOUTH ONCE DAILY, Disp: 90 tablet, Rfl: 1 .  methocarbamol (ROBAXIN) 750 MG tablet, Take 750 mg by mouth 4 (four) times daily. , Disp: , Rfl:  .  pravastatin (PRAVACHOL) 40 MG tablet, Take 1 tablet (40 mg total) by mouth daily. (Patient taking differently: Take 20 mg by mouth at bedtime. ), Disp: 90 tablet, Rfl: 1 .  Vitamin D, Ergocalciferol, (DRISDOL) 50000 units CAPS capsule, TAKE ONE CAPSULE BY MOUTH ONCE A WEEK OR  BIWEKLY  AS  DIRECTED (Patient taking differently: TAKE ONE CAPSULE BY MOUTH EVERY 14 DAYS), Disp: 4 capsule, Rfl: 6  Allergies  Allergen Reactions  . Aspirin Nausea Only and Other (See Comments)  . Penicillins Hives and Swelling    SWELLING REACTION UNSPECIFIED   Has patient had a PCN reaction causing immediate rash, facial/tongue/throat swelling, SOB or lightheadedness with hypotension: #  #  #  YES  #  #  #  Has patient had a PCN reaction causing severe rash involving mucus membranes or skin necrosis:  #  #  #  UNKNOWN  #  #  #   Has patient had a PCN reaction that required hospitalization:  #  #  #  UNKNOWN  #  #  #  Has patient had a PCN reaction occurring within the last 10 years:  #  #  #  NO  #  #  #      ROS  Constitutional: Negative for fever or weight change.  Respiratory: Positive for intermittent  cough and shortness of breath.   Cardiovascular: Negative for chest pain or palpitations.  Gastrointestinal: Negative for abdominal pain, no bowel changes.  Musculoskeletal: Positive for gait problem (only when walks for a prolonged period of time) or joint swelling.  Skin: Negative for rash.  Neurological: Negative for dizziness or headache.  No other specific complaints in a complete review of systems (except as listed in HPI above).  Objective  Vitals:   12/15/16 1334  BP: (!) 142/78  Pulse: 81  Resp: 16  Temp: 97.5 F (36.4 C)  SpO2: 96%  Weight: 166 lb 9 oz (75.6 kg)  Height: 5\' 11"  (1.803 m)    Body mass index is 23.23  kg/m.  Physical Exam  Constitutional: Patient appears well-developed and well-nourished. Obese  No distress.  HEENT: head atraumatic, normocephalic, pupils equal and reactive to light,  neck supple, throat within normal limits Cardiovascular: Normal rate, regular rhythm and normal heart sounds.  No murmur heard. No BLE edema. Pulmonary/Chest: Effort normal and breath sounds normal. No respiratory distress. Abdominal: Soft.  There is no tenderness. Psychiatric: Patient has a normal mood and affect. behavior is normal. Judgment and thought content normal.   Diabetic Foot Exam: Diabetic Foot Exam - Simple   Simple Foot Form Diabetic Foot exam was performed with the following findings:  Yes 12/15/2016  1:55 PM  Visual Inspection See comments:  Yes Sensation Testing Intact to touch and monofilament testing bilaterally:  Yes Pulse Check Posterior Tibialis and Dorsalis pulse intact bilaterally:  Yes Comments He has mild erythema, feet a little cold, normal pulses,  Corn formation on base of both great toes      PHQ2/9: Depression screen [facility] 2/9 06/30/2016 03/21/2016 12/21/2015 11/10/2015 09/16/2015  Decreased Interest 0 0 0 0 0  Down, Depressed, Hopeless 0 0 0 0 0  PHQ - 2 Score 0 0 0 0 0  Altered sleeping - 0 - - -  Tired, decreased energy - 0 - - -  Change in appetite - 0 - - -  Feeling bad or failure about yourself  - 0 - - -  Trouble concentrating - 0 - - -  Moving slowly or fidgety/restless - 0 - - -  Suicidal thoughts - 0 - - -  PHQ-9 Score - 0 - - -     Fall Risk: Fall Risk  06/30/2016 12/21/2015 11/10/2015 09/16/2015 07/13/2015  Falls in the past year? No No No No No      Assessment & Plan  1. Controlled type 2 diabetes with neuropathy ([place])  hgbA1C  rx for insoles and new diabetic shoes written today   2. COPD, mild ([place])  Stable  3. Benign hypertension  - COMPLETE METABOLIC PANEL WITH GFR  4. Dyslipidemia  - Lipid panel  5. Purpura, nonthrombocytopenic  (HCC)  Stable, reassurance  6. History of lumbar surgery  Improving, having PT and states pain is going down   7. Vitamin D deficiency  - VITAMIN D 25 Hydroxy (Vit-D Deficiency, Fractures)  8. Chronic constipation  Taking Amitiza, down to once daily   9. Protein-calorie malnutrition, mild (HCC)  Recheck level  10. Paranoid schizophrenia ([place])  Stable, not on medication, refuses  11. Chronic hepatitis C without hepatic coma (HCC)  Refuses therapy  12. Claudication [facility])  Neurogenic, seen by vascular surgeon   13. Intermittent abdominal pain  Advised to see GI ( for colonoscopy), but he states he can't afford it at this time  14. Bilateral lower extremity edema  - CBC with Differential/Platelet Doing better with compression stocking hoses

## 2016-12-16 LAB — VITAMIN D 25 HYDROXY (VIT D DEFICIENCY, FRACTURES): Vit D, 25-Hydroxy: 26 ng/mL — ABNORMAL LOW (ref 30–100)

## 2016-12-19 ENCOUNTER — Ambulatory Visit: Payer: PPO | Admitting: Physical Therapy

## 2016-12-19 DIAGNOSIS — G8929 Other chronic pain: Secondary | ICD-10-CM

## 2016-12-19 DIAGNOSIS — M545 Low back pain, unspecified: Secondary | ICD-10-CM

## 2016-12-19 DIAGNOSIS — M6281 Muscle weakness (generalized): Secondary | ICD-10-CM

## 2016-12-19 DIAGNOSIS — R262 Difficulty in walking, not elsewhere classified: Secondary | ICD-10-CM

## 2016-12-19 NOTE — Patient Instructions (Addendum)
NuStep level 2 x 4 minutes to increase LE blood flow to help with metabolite clearance for pain reduction   Ely's position stretch  1/2 kneeling hip flexor stretch   TRX squats x 15 for 2 sets with 1 minute rest (reports this was quite challenging)   Deadlifts with 10# weight -- 2 sets x 6 repetitions (felt in HS, glutes, mild in lumbar spine with increased reps)

## 2016-12-19 NOTE — Therapy (Signed)
[facility] [address], [place], [address] Phone: [phone]   Fax:  [phone]  Physical Therapy Treatment  Patient Details  Name: Mark Nash MRN: [id number] Date of Birth: 05/15/1956 No Data Recorded  Encounter Date: 12/19/2016      PT End of Session - 12/19/16 1418    Visit Number 3   Number of Visits 13   Date for PT Re-Evaluation 02/05/17   PT Start Time 1340   PT Stop Time 1418   PT Time Calculation (min) 38 min   Activity Tolerance Patient tolerated treatment well   Behavior During Therapy [facility] for tasks assessed/performed      Past Medical History:  Diagnosis Date  . Arthritis   . Asthma   . Constipation   . COPD (chronic obstructive pulmonary disease) ([place])   . Diabetes mellitus without complication (HCC)    Type 2  . Hepatitis C   . History of kidney stones   . Hyperlipidemia   . Hypertension   . Restless legs   . Schizoaffective disorder ([place])    Pt denies  . Vitamin D deficiency     Past Surgical History:  Procedure Laterality Date  . HERNIA REPAIR     inguinal  . L3 TO L5 LAMINECTOMY FOR DECOMPRESSION  07/17/2016   [facility]  . REPAIR OF CEREBROSPINAL FLUID LEAK N/A 09/08/2016   Procedure: Lumbar wound exploration, repair of pseudomenigocele;  Surgeon: Kevan Ny Ditty, MD;  Location: [facility];  Service: Neurosurgery;  Laterality: N/A;  Lumbar wound exploration, repair of pseudomenigocele  . SINUSOTOMY      There were no vitals filed for this visit.      Subjective Assessment - 12/19/16 1342    Subjective Patient reports he is cutting back on his pain medications, no increase in normal pain levels. He has been able to walk multiple times in the last week, but feels some increase in soreness and feels wiped out. He reports he began having some R anterior leg numbness, not weakness this morning. It sounds like this has happened a few times since  his operation.    Limitations Sitting;Lifting;Standing;Walking   Diagnostic tests MRI   Patient Stated Goals Get rid of the stiffness in his back with washing dishes, be able to go out and walk more than 1x per week without burning sensation in his LEs.    Currently in Pain? No/denies  "Just the regular, just some soreness in his legs".      NuStep level 2 x 4 minutes to increase LE blood flow to help with metabolite clearance for pain reduction   Ely's position stretch x 4 minutes per side (initially painful on both sides, reduced with PNF contract/relax to ease symptoms)  1/2 kneeling hip flexor stretch -- x 8 repetitions per side, no discomfort noted.   TRX squats x 15 for 2 sets with 1 minute rest (reports this was quite challenging)   Deadlifts with 10# weight -- 2 sets x 6 repetitions (felt in HS, glutes, mild in lumbar spine with increased reps) -- patient reports he was quite fatigued afterwards.                              PT Education - 12/19/16 1348    Education provided Yes   Education Details Will continue to progress LE strengthening and stretching as tolerated.  Person(s) Educated Patient   Methods Explanation;Demonstration   Comprehension Verbalized understanding;Returned demonstration             PT Long Term Goals - 12/11/16 1243      PT LONG TERM GOAL #1   Title Patient will report worst back pain of 3/10 to demonstrate improved tolerance for ADLs.    Time 8   Period Weeks   Status New     PT LONG TERM GOAL #2   Title Patient will perform 6MW test and ambulate > 1,000' with no increase in leg or low back pain to demonstrate improved tolerance for community mobility.    Time 8   Period Weeks   Status New     PT LONG TERM GOAL #3   Title Patient will perform 5x sit to stand in < 13 seconds with no increase in LE pain to demonstrate improved tolerance for ADLs.    Baseline 16.65 seconds with increase in leg pain bilaterally     Time 8   Period Weeks   Status New     PT LONG TERM GOAL #4   Title Patient will report modified ODI of less than 16% disability to demonstrate improved tolerance for ADLs.    Baseline 26%   Time 8   Period Weeks   Status New               Plan - 12/19/16 1422    Clinical Impression Statement Patient reports he has noticed significant improvement in his symptoms and tolerance for activity both in clinic and outside of clinic. He does have relative decrease in strength from what would be anticipated for his age in his LEs.    Rehab Potential Fair   PT Frequency 2x / week   PT Duration 4 weeks   PT Treatment/Interventions Aquatic Therapy;Electrical Stimulation;Ultrasound;Moist Heat;Iontophoresis 4mg /ml Dexamethasone;Gait training;Stair training;Manual techniques;Therapeutic exercise;Therapeutic activities;Balance training;Cryotherapy;Neuromuscular re-education   PT Next Visit Plan Perform 6MW test, increase HEP    PT Home Exercise Plan Supine bridging, hamstring stretching, quad stretching    Consulted and Agree with Plan of Care Patient      Patient will benefit from skilled therapeutic intervention in order to improve the following deficits and impairments:  Pain, Cardiopulmonary status limiting activity, Decreased activity tolerance, Difficulty walking, Decreased strength, Decreased range of motion, Hypomobility, Impaired flexibility, Decreased mobility, Impaired sensation  Visit Diagnosis: Chronic midline low back pain without sciatica  Difficulty in walking, not elsewhere classified  Muscle weakness (generalized)     Problem List Patient Active Problem List   Diagnosis Date Noted  . 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 Lymphedema 07/07/2016  . Hepatitis C 06/01/2016  . Lumbar herniated disc 11/24/2015  . Protein malnutrition ([place]) 11/11/2015  . Thrombocytopenia ([place]) 11/11/2015  . Hepatitis C antibody test positive 07/13/2015  . Chronic  constipation 07/13/2015  . Abnormal immunological findings in specimens from other organs, systems and tissues 07/13/2015  . CN (constipation) 07/13/2015  . Benign hypertension 03/08/2015  . COPD, mild ([place]) 03/08/2015  . Dyslipidemia 03/08/2015  . Paranoid schizophrenia ([place]) 03/08/2015  . Acquired polyneuropathy 03/08/2015  . Restless leg 03/08/2015  . Tobacco abuse 03/08/2015  . Callus of foot 03/08/2015  . Claudication ([place]) 03/08/2015  . Chronic obstructive pulmonary disease ([place]) 03/08/2015  . Corn or callus 03/08/2015  . Essential (primary) hypertension 03/08/2015  . HLD (hyperlipidemia) 03/08/2015  . Peripheral vascular disease ([place]) 03/08/2015  . Polyneuropathy 03/08/2015  . Current tobacco use 03/08/2015  . Vitamin D  deficiency 04/26/2009  . Avitaminosis D 04/26/2009   Royce Macadamia PT, DPT, CSCS    12/19/2016, 2:28 PM  [facility] [address] [place], [address] Phone: [phone]   Fax:  [phone]  Name: REBEKAH SPRINKLE MRN: [id number] Date of Birth: August 04, 1956

## 2016-12-21 ENCOUNTER — Ambulatory Visit: Payer: PPO | Admitting: Physical Therapy

## 2016-12-21 DIAGNOSIS — M545 Low back pain: Principal | ICD-10-CM

## 2016-12-21 DIAGNOSIS — G8929 Other chronic pain: Secondary | ICD-10-CM

## 2016-12-21 DIAGNOSIS — M6281 Muscle weakness (generalized): Secondary | ICD-10-CM

## 2016-12-21 DIAGNOSIS — R262 Difficulty in walking, not elsewhere classified: Secondary | ICD-10-CM

## 2016-12-21 NOTE — Patient Instructions (Signed)
Ely's position stretch x 4 minutes per side (improved ROM)  Squats on total gym level 26 x 12 for 2 set   Step ups x 8 for 2 sets with 1 HHA with 5# DB   Rotary hip abductions 40# x 8 repetitions x 2 sets (2nd set at 25#)   Lateral band walking -- x 8 per side with red t-band for 2 sets

## 2016-12-21 NOTE — Therapy (Signed)
[facility] [address] [place], [address] Phone: [phone]   Fax:  [phone]  Physical Therapy Treatment  Patient Details  Name: Mark Nash MRN: [id number] Date of Birth: Sep 18, 1955 No Data Recorded  Encounter Date: 12/21/2016      PT End of Session - 12/21/16 1439    Visit Number 4   Number of Visits 13   Date for PT Re-Evaluation 02/05/17   PT Start Time 1436   PT Stop Time 1514   PT Time Calculation (min) 38 min   Activity Tolerance Patient tolerated treatment well   Behavior During Therapy [facility] for tasks assessed/performed      Past Medical History:  Diagnosis Date  . Arthritis   . Asthma   . Constipation   . COPD (chronic obstructive pulmonary disease) ([place])   . Diabetes mellitus without complication (HCC)    Type 2  . Hepatitis C   . History of kidney stones   . Hyperlipidemia   . Hypertension   . Restless legs   . Schizoaffective disorder ([place])    Pt denies  . Vitamin D deficiency     Past Surgical History:  Procedure Laterality Date  . HERNIA REPAIR     inguinal  . L3 TO L5 LAMINECTOMY FOR DECOMPRESSION  07/17/2016   [facility]  . REPAIR OF CEREBROSPINAL FLUID LEAK N/A 09/08/2016   Procedure: Lumbar wound exploration, repair of pseudomenigocele;  Surgeon: Kevan Ny Ditty, MD;  Location: [facility];  Service: Neurosurgery;  Laterality: N/A;  Lumbar wound exploration, repair of pseudomenigocele  . SINUSOTOMY      There were no vitals filed for this visit.      Subjective Assessment - 12/21/16 1437    Subjective Patient reports he was quite sore in his legs after the session the other day. He felt some weakness in his LEs, but has since recovered from that. No other adverse events, though he is still sore in his LEs today.    Limitations Sitting;Lifting;Standing;Walking   Diagnostic tests MRI   Patient Stated Goals Get rid of the stiffness in his  back with washing dishes, be able to go out and walk more than 1x per week without burning sensation in his LEs.    Currently in Pain? Yes   Pain Score --  Reports back is sore and a little stiff.   Pain Location Back   Pain Orientation Lower;Medial   Pain Descriptors / Indicators Sore   Pain Type Chronic pain;Surgical pain   Pain Onset More than a month ago   Pain Frequency Intermittent   Multiple Pain Sites Yes   Pain Score --  Patient reports he feels pain/soreness that take precendence over the back    Pain Location Leg   Pain Orientation Right;Left;Anterior   Pain Descriptors / Indicators Aching   Pain Type Chronic pain;Surgical pain   Pain Onset More than a month ago   Pain Frequency Intermittent   Aggravating Factors  Increased ambulation      Ely's position stretch x 4 minutes per side (improved ROM)  Squats on total gym level 26 x 12 for 2 set   Step ups x 8 for 2 sets with 1 HHA with 5# DB   Rotary hip abductions 40# x 8 repetitions x 2 sets (2nd set at 25#)   Lateral band walking -- x 8 per side with red t-band for 2 sets  PT Education - 12/21/16 1439    Education provided Yes   Education Details Continue to increase workload and strengthening to develop LE musculature and tolerance for activity.    Person(s) Educated Patient   Methods Explanation;Demonstration;Handout   Comprehension Verbalized understanding;Returned demonstration             PT Long Term Goals - 12/11/16 1243      PT LONG TERM GOAL #1   Title Patient will report worst back pain of 3/10 to demonstrate improved tolerance for ADLs.    Time 8   Period Weeks   Status New     PT LONG TERM GOAL #2   Title Patient will perform 6MW test and ambulate > 1,000' with no increase in leg or low back pain to demonstrate improved tolerance for community mobility.    Time 8   Period Weeks   Status New     PT LONG TERM GOAL #3   Title Patient  will perform 5x sit to stand in < 13 seconds with no increase in LE pain to demonstrate improved tolerance for ADLs.    Baseline 16.65 seconds with increase in leg pain bilaterally    Time 8   Period Weeks   Status New     PT LONG TERM GOAL #4   Title Patient will report modified ODI of less than 16% disability to demonstrate improved tolerance for ADLs.    Baseline 26%   Time 8   Period Weeks   Status New               Plan - 12/21/16 1440    Clinical Impression Statement Patient reports he was quite sore and fatigued from previous session, but no other adverse feelings/events associated with that. Patient continues to progress with overall workload in clinic, though notable for visual deficit in hip musculature. It appears as though he is tolerating more activity overall.    Rehab Potential Fair   PT Frequency 2x / week   PT Duration 4 weeks   PT Treatment/Interventions Aquatic Therapy;Electrical Stimulation;Ultrasound;Moist Heat;Iontophoresis 4mg /ml Dexamethasone;Gait training;Stair training;Manual techniques;Therapeutic exercise;Therapeutic activities;Balance training;Cryotherapy;Neuromuscular re-education   PT Next Visit Plan Perform 6MW test, increase HEP    PT Home Exercise Plan Supine bridging, hamstring stretching, quad stretching    Consulted and Agree with Plan of Care Patient      Patient will benefit from skilled therapeutic intervention in order to improve the following deficits and impairments:  Pain, Cardiopulmonary status limiting activity, Decreased activity tolerance, Difficulty walking, Decreased strength, Decreased range of motion, Hypomobility, Impaired flexibility, Decreased mobility, Impaired sensation  Visit Diagnosis: Chronic midline low back pain without sciatica  Difficulty in walking, not elsewhere classified  Muscle weakness (generalized)     Problem List Patient Active Problem List   Diagnosis Date Noted  . Postprocedural  pseudomeningocele 09/08/2016  . Lymphedema 07/07/2016  . Hepatitis C 06/01/2016  . Lumbar herniated disc 11/24/2015  . Protein malnutrition ([place]) 11/11/2015  . Thrombocytopenia ([place]) 11/11/2015  . Hepatitis C antibody test positive 07/13/2015  . Chronic constipation 07/13/2015  . Abnormal immunological findings in specimens from other organs, systems and tissues 07/13/2015  . CN (constipation) 07/13/2015  . Benign hypertension 03/08/2015  . COPD, mild ([place]) 03/08/2015  . Dyslipidemia 03/08/2015  . Paranoid schizophrenia ([place]) 03/08/2015  . Acquired polyneuropathy 03/08/2015  . Restless leg 03/08/2015  . Tobacco abuse 03/08/2015  . Callus of foot 03/08/2015  . Claudication ([place]) 03/08/2015  . Chronic obstructive pulmonary disease (HCC)  03/08/2015  . Corn or callus 03/08/2015  . Essential (primary) hypertension 03/08/2015  . HLD (hyperlipidemia) 03/08/2015  . Peripheral vascular disease ([place]) 03/08/2015  . Polyneuropathy 03/08/2015  . Current tobacco use 03/08/2015  . Vitamin D deficiency 04/26/2009  . Avitaminosis D 04/26/2009   Royce Macadamia PT, DPT, CSCS    12/21/2016, 3:36 PM  [facility] [address], [place], [address] Phone: [phone]   Fax:  [phone]  Name: Mark Nash MRN: [id number] Date of Birth: August 07, 1956

## 2016-12-25 ENCOUNTER — Ambulatory Visit: Payer: PPO | Admitting: Physical Therapy

## 2016-12-25 DIAGNOSIS — M545 Low back pain, unspecified: Secondary | ICD-10-CM

## 2016-12-25 DIAGNOSIS — G8929 Other chronic pain: Secondary | ICD-10-CM

## 2016-12-25 DIAGNOSIS — M6281 Muscle weakness (generalized): Secondary | ICD-10-CM

## 2016-12-25 DIAGNOSIS — R262 Difficulty in walking, not elsewhere classified: Secondary | ICD-10-CM

## 2016-12-25 NOTE — Therapy (Addendum)
[facility] [address] [place], [address] Phone: [phone]   Fax:  [phone]  Physical Therapy Treatment  Patient Details  Name: Mark Nash MRN: [id number] Date of Birth: 10-01-1955 No Data Recorded  Encounter Date: 12/25/2016      PT End of Session - 12/25/16 1353    Visit Number 5   Number of Visits 13   Date for PT Re-Evaluation 02/05/17   PT Start Time 1351   PT Stop Time 1433   PT Time Calculation (min) 42 min   Activity Tolerance Patient tolerated treatment well   Behavior During Therapy [facility] for tasks assessed/performed      Past Medical History:  Diagnosis Date  . Arthritis   . Asthma   . Constipation   . COPD (chronic obstructive pulmonary disease) ([place])   . Diabetes mellitus without complication (HCC)    Type 2  . Hepatitis C   . History of kidney stones   . Hyperlipidemia   . Hypertension   . Restless legs   . Schizoaffective disorder ([place])    Pt denies  . Vitamin D deficiency     Past Surgical History:  Procedure Laterality Date  . HERNIA REPAIR     inguinal  . L3 TO L5 LAMINECTOMY FOR DECOMPRESSION  07/17/2016   [facility]  . REPAIR OF CEREBROSPINAL FLUID LEAK N/A 09/08/2016   Procedure: Lumbar wound exploration, repair of pseudomenigocele;  Surgeon: Kevan Ny Ditty, MD;  Location: [facility];  Service: Neurosurgery;  Laterality: N/A;  Lumbar wound exploration, repair of pseudomenigocele  . SINUSOTOMY      There were no vitals filed for this visit.      Subjective Assessment - 12/25/16 1353    Subjective Patient reports he is recovering faster and more confident in his ability to complete ADLs. He did have a small setback over the weekend (he was vacuuming his couch and felt some pain in his back).    Limitations Sitting;Lifting;Standing;Walking   Diagnostic tests MRI   Patient Stated Goals Get rid of the stiffness in his back with washing  dishes, be able to go out and walk more than 1x per week without burning sensation in his LEs.    Currently in Pain? Yes   Pain Score --  Reports mild to moderate discomfort in his lumbar spine with prolonged ambulation.    Pain Location Back   Pain Orientation Lower;Medial   Pain Descriptors / Indicators Sore   Pain Type Chronic pain;Surgical pain   Pain Onset More than a month ago   Pain Frequency Intermittent   Aggravating Factors  Increased ambulation.       Rotational press 5# x 8 repetitions for 2 sets   TRX Squats x 10 (challenging) x 2 sets -- feels a sense a fatigue after the second round.   Leg Press 75# x6 for 3 sets (challenging but appropriate)   Sidestepping with green t-band 2 rounds per set x 10 repetitions   BP - 181/98 HR -75 bpm   Allowed patient to sit and provided water to allow for BP to reduce, re-checked after 3 minutes (BP - 167/104)                             PT Education - 12/25/16 1353    Education provided Yes   Education Details Will continue working on strengthening and re-conditioning  his LEs, encouraged patient to attempt increased mobility to see how his recovery times and pain levels are after.    Person(s) Educated Patient   Methods Explanation;Demonstration   Comprehension Verbalized understanding;Returned demonstration             PT Long Term Goals - 12/11/16 1243      PT LONG TERM GOAL #1   Title Patient will report worst back pain of 3/10 to demonstrate improved tolerance for ADLs.    Time 8   Period Weeks   Status New     PT LONG TERM GOAL #2   Title Patient will perform 6MW test and ambulate > 1,000' with no increase in leg or low back pain to demonstrate improved tolerance for community mobility.    Time 8   Period Weeks   Status New     PT LONG TERM GOAL #3   Title Patient will perform 5x sit to stand in < 13 seconds with no increase in LE pain to demonstrate improved tolerance for ADLs.     Baseline 16.65 seconds with increase in leg pain bilaterally    Time 8   Period Weeks   Status New     PT LONG TERM GOAL #4   Title Patient will report modified ODI of less than 16% disability to demonstrate improved tolerance for ADLs.    Baseline 26%   Time 8   Period Weeks   Status New               Plan - 12/25/16 1354    Clinical Impression Statement Patient continues to tolerate increased workloads with decreased recovery times needed. His symptoms are somewhat vague and only reproduced by prolonged ambulation, thus unable to perform standard "test-retest" however he appears to be improving in strength and endurance with reported decrease in symptoms. Will continue to progress with supervised exercises and transition to more home/gym based routine as tolerated.    Rehab Potential Fair   PT Frequency 2x / week   PT Duration 4 weeks   PT Treatment/Interventions Aquatic Therapy;Electrical Stimulation;Ultrasound;Moist Heat;Iontophoresis 4mg /ml Dexamethasone;Gait training;Stair training;Manual techniques;Therapeutic exercise;Therapeutic activities;Balance training;Cryotherapy;Neuromuscular re-education   PT Next Visit Plan Perform 6MW test, increase HEP    PT Home Exercise Plan Supine bridging, hamstring stretching, quad stretching    Consulted and Agree with Plan of Care Patient      Patient will benefit from skilled therapeutic intervention in order to improve the following deficits and impairments:  Pain, Cardiopulmonary status limiting activity, Decreased activity tolerance, Difficulty walking, Decreased strength, Decreased range of motion, Hypomobility, Impaired flexibility, Decreased mobility, Impaired sensation  Visit Diagnosis: Chronic midline low back pain without sciatica  Difficulty in walking, not elsewhere classified  Muscle weakness (generalized)     Problem List Patient Active Problem List   Diagnosis Date Noted  . Postprocedural pseudomeningocele  09/08/2016  . Lymphedema 07/07/2016  . Hepatitis C 06/01/2016  . Lumbar herniated disc 11/24/2015  . Protein malnutrition ([place]) 11/11/2015  . Thrombocytopenia ([place]) 11/11/2015  . Hepatitis C antibody test positive 07/13/2015  . Chronic constipation 07/13/2015  . Abnormal immunological findings in specimens from other organs, systems and tissues 07/13/2015  . CN (constipation) 07/13/2015  . Benign hypertension 03/08/2015  . COPD, mild ([place]) 03/08/2015  . Dyslipidemia 03/08/2015  . Paranoid schizophrenia ([place]) 03/08/2015  . Acquired polyneuropathy 03/08/2015  . Restless leg 03/08/2015  . Tobacco abuse 03/08/2015  . Callus of foot 03/08/2015  . Claudication ([place]) 03/08/2015  . Chronic obstructive  pulmonary disease ([place]) 03/08/2015  . Corn or callus 03/08/2015  . Essential (primary) hypertension 03/08/2015  . HLD (hyperlipidemia) 03/08/2015  . Peripheral vascular disease ([place]) 03/08/2015  . Polyneuropathy 03/08/2015  . Current tobacco use 03/08/2015  . Vitamin D deficiency 04/26/2009  . Avitaminosis D 04/26/2009   Royce Macadamia PT, DPT, CSCS    12/26/2016, 11:23 AM  [facility] [address], [place], [address] Phone: [phone]   Fax:  [phone]  Name: Mark Nash MRN: [id number] Date of Birth: [birth year]

## 2016-12-25 NOTE — Patient Instructions (Signed)
Started at 1354   Rotational press 5# x 8 repetitions for 2 sets   TRX Squats x 10 (challenging) x 2 sets -- feels a sense a fatigue after the second round.   Leg Press 75# x6 for 3 sets   Sidestepping with green t-band 2 rounds per set x 10 repetitions   BP - 181/98 HR -75 bpm   Allowed patient to sit and provided water to allow for BP to reduce, re-checked after 3 minutes (BP - 167/104)

## 2016-12-28 ENCOUNTER — Ambulatory Visit: Payer: PPO | Attending: Physician Assistant | Admitting: Physical Therapy

## 2016-12-28 DIAGNOSIS — M545 Low back pain, unspecified: Secondary | ICD-10-CM

## 2016-12-28 DIAGNOSIS — R262 Difficulty in walking, not elsewhere classified: Secondary | ICD-10-CM | POA: Diagnosis not present

## 2016-12-28 DIAGNOSIS — M6281 Muscle weakness (generalized): Secondary | ICD-10-CM

## 2016-12-28 DIAGNOSIS — G8929 Other chronic pain: Secondary | ICD-10-CM | POA: Diagnosis not present

## 2016-12-28 NOTE — Patient Instructions (Signed)
Piriformis stretch  HS Stretch  5x sit to stand - 13.96 seconds   Total gym level 26 x 15 reps x 2 sets  Deadlifts -- 2 sets of 20# KB x 6 (cuing for posture/position)   Step up test x 30" to 2 risers- 10 repetitions total   Attempted alternating jump steps x 5 (too difficult to initiate plyometric movement) Opted for 10 step ups to 1 riser for speed bilaterally with no rest breaks between   Added 2 risers and plastic step with 5# DBs in each hand for 10 step ups bilaterally with no break   5 single leg deadlifts with 1 HHA x 5 repetitions per side   Vitals after  BP - 162/76   HR -77, O-100%

## 2016-12-28 NOTE — Therapy (Signed)
[facility] [address] [place], [address] Phone: [phone]   Fax:  [phone]  Physical Therapy Treatment  Patient Details  Name: Mark Nash MRN: [id number] Date of Birth: [birth year] No Data Recorded  Encounter Date: 12/28/2016      PT End of Session - 12/28/16 1308    Visit Number 6   Number of Visits 13   Date for PT Re-Evaluation 02/05/17   PT Start Time 1300   PT Stop Time 1338   PT Time Calculation (min) 38 min   Activity Tolerance Patient tolerated treatment well   Behavior During Therapy [facility] for tasks assessed/performed      Past Medical History:  Diagnosis Date  . Arthritis   . Asthma   . Constipation   . COPD (chronic obstructive pulmonary disease) ([place])   . Diabetes mellitus without complication (HCC)    Type 2  . Hepatitis C   . History of kidney stones   . Hyperlipidemia   . Hypertension   . Restless legs   . Schizoaffective disorder ([place])    Pt denies  . Vitamin D deficiency     Past Surgical History:  Procedure Laterality Date  . HERNIA REPAIR     inguinal  . L3 TO L5 LAMINECTOMY FOR DECOMPRESSION  07/17/2016   [facility]  . REPAIR OF CEREBROSPINAL FLUID LEAK N/A 09/08/2016   Procedure: Lumbar wound exploration, repair of pseudomenigocele;  Surgeon: Kevan Ny Ditty, MD;  Location: [facility];  Service: Neurosurgery;  Laterality: N/A;  Lumbar wound exploration, repair of pseudomenigocele  . SINUSOTOMY      There were no vitals filed for this visit.      Subjective Assessment - 12/28/16 1303    Subjective Patient reports he has had almost no breaks this week and has been quite busy. He reports he has had less fatigue and soreness after in his upper thighs, but reduced in his lower legs.    Limitations Sitting;Lifting;Standing;Walking   Diagnostic tests MRI   Patient Stated Goals Get rid of the stiffness in his back with washing dishes, be  able to go out and walk more than 1x per week without burning sensation in his LEs.    Currently in Pain? Yes   Pain Score --  Big strong pain around the lateral L leg. Burning/aching sensation in the other parts of his legs.   Pain Onset More than a month ago      Piriformis stretch x 5 minutes with oscillations off and on provided by therapist. -- Patient reported relief and dissipation of lateral hip pain after completion   HS Stretch -- x 2 minutes, notable increase in ROM at 90-90 position from initial testing day.   5x sit to stand - 13.96 seconds   Total gym level 26 x 15 reps x 2 sets (quite fatiguing for him)   Deadlifts -- 2 sets of 20# KB x 6 (cuing for posture/position)   Step up test x 30" to 2 risers- 10 repetitions total   Attempted alternating jump steps x 5 (too difficult to initiate plyometric movement) Opted for 10 step ups to 1 riser for speed bilaterally with no rest breaks between   Added 2 risers and plastic step with 5# DBs in each hand for 10 step ups bilaterally with no break   5 single leg deadlifts with 1 HHA x 5 repetitions per side   Vitals after  BP -  162/76   HR -77, O-100%                           PT Education - 12/28/16 1307    Education provided Yes   Education Details To continue with HEP, go for a walk over the weekend and provide feedback on symptoms and recovery.    Person(s) Educated Patient   Methods Explanation;Demonstration   Comprehension Verbalized understanding;Returned demonstration             PT Long Term Goals - 12/11/16 1243      PT LONG TERM GOAL #1   Title Patient will report worst back pain of 3/10 to demonstrate improved tolerance for ADLs.    Time 8   Period Weeks   Status New     PT LONG TERM GOAL #2   Title Patient will perform 6MW test and ambulate > 1,000' with no increase in leg or low back pain to demonstrate improved tolerance for community mobility.    Time 8   Period Weeks    Status New     PT LONG TERM GOAL #3   Title Patient will perform 5x sit to stand in < 13 seconds with no increase in LE pain to demonstrate improved tolerance for ADLs.    Baseline 16.65 seconds with increase in leg pain bilaterally    Time 8   Period Weeks   Status New     PT LONG TERM GOAL #4   Title Patient will report modified ODI of less than 16% disability to demonstrate improved tolerance for ADLs.    Baseline 26%   Time 8   Period Weeks   Status New               Plan - 12/28/16 1346    Clinical Impression Statement Patient reports he continues to have reduced pain in proximal thighs and reduced recovery times from exercise bouts. He continues to have pain in his legs, but appears to be tolerating strengthening and endurance work quite well and benefitting from general exercise program.    Rehab Potential Fair   PT Frequency 2x / week   PT Duration 4 weeks   PT Treatment/Interventions Aquatic Therapy;Electrical Stimulation;Ultrasound;Moist Heat;Iontophoresis 4mg /ml Dexamethasone;Gait training;Stair training;Manual techniques;Therapeutic exercise;Therapeutic activities;Balance training;Cryotherapy;Neuromuscular re-education   PT Next Visit Plan Perform 6MW test, increase HEP    PT Home Exercise Plan Supine bridging, hamstring stretching, quad stretching    Consulted and Agree with Plan of Care Patient      Patient will benefit from skilled therapeutic intervention in order to improve the following deficits and impairments:  Pain, Cardiopulmonary status limiting activity, Decreased activity tolerance, Difficulty walking, Decreased strength, Decreased range of motion, Hypomobility, Impaired flexibility, Decreased mobility, Impaired sensation  Visit Diagnosis: Chronic midline low back pain without sciatica  Difficulty in walking, not elsewhere classified  Muscle weakness (generalized)     Problem List Patient Active Problem List   Diagnosis Date Noted  .  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 Lymphedema 07/07/2016  . Hepatitis C 06/01/2016  . Lumbar herniated disc 11/24/2015  . Protein malnutrition ([place]) 11/11/2015  . Thrombocytopenia ([place]) 11/11/2015  . Hepatitis C antibody test positive 07/13/2015  . Chronic constipation 07/13/2015  . Abnormal immunological findings in specimens from other organs, systems and tissues 07/13/2015  . CN (constipation) 07/13/2015  . Benign hypertension 03/08/2015  . COPD, mild ([place]) 03/08/2015  . Dyslipidemia 03/08/2015  . Paranoid schizophrenia ([place]) 03/08/2015  .  Acquired polyneuropathy 03/08/2015  . Restless leg 03/08/2015  . Tobacco abuse 03/08/2015  . Callus of foot 03/08/2015  . Claudication ([place]) 03/08/2015  . Chronic obstructive pulmonary disease ([place]) 03/08/2015  . Corn or callus 03/08/2015  . Essential (primary) hypertension 03/08/2015  . HLD (hyperlipidemia) 03/08/2015  . Peripheral vascular disease ([place]) 03/08/2015  . Polyneuropathy 03/08/2015  . Current tobacco use 03/08/2015  . Vitamin D deficiency 04/26/2009  . Avitaminosis D 04/26/2009    Royce Macadamia PT, DPT, CSCS    12/28/2016, 1:53 PM  [facility] [address] [place], [address] Phone: [phone]   Fax:  [phone]  Name: SOLAN VOSLER MRN: [id number] Date of Birth: 01/06/1956

## 2017-01-01 ENCOUNTER — Ambulatory Visit: Payer: PPO | Admitting: Physical Therapy

## 2017-01-01 DIAGNOSIS — M545 Low back pain, unspecified: Secondary | ICD-10-CM

## 2017-01-01 DIAGNOSIS — R262 Difficulty in walking, not elsewhere classified: Secondary | ICD-10-CM

## 2017-01-01 DIAGNOSIS — M6281 Muscle weakness (generalized): Secondary | ICD-10-CM

## 2017-01-01 DIAGNOSIS — G8929 Other chronic pain: Secondary | ICD-10-CM

## 2017-01-01 NOTE — Patient Instructions (Addendum)
Ankle DF mobilizations   Incline board calf stretching   TRX swquats x 12   Wall squats with TRX -- 2 sets x 8  Leg PRess 65#x 8 reps - with 30" rest breaks

## 2017-01-03 NOTE — Therapy (Signed)
[facility] [address], [place], [address] Phone: [phone]   Fax:  [phone]  Physical Therapy Treatment  Patient Details  Name: Mark Nash MRN: [id number] Date of Birth: July 09, 1956 No Data Recorded  Encounter Date: 01/01/2017      PT End of Session - 01/03/17 1257    Visit Number 7   Number of Visits 13   Date for PT Re-Evaluation 02/05/17   PT Start Time 1307   PT Stop Time 1345   PT Time Calculation (min) 38 min   Activity Tolerance Patient tolerated treatment well   Behavior During Therapy [facility] for tasks assessed/performed      Past Medical History:  Diagnosis Date  . Arthritis   . Asthma   . Constipation   . COPD (chronic obstructive pulmonary disease) ([place])   . Diabetes mellitus without complication (HCC)    Type 2  . Hepatitis C   . History of kidney stones   . Hyperlipidemia   . Hypertension   . Restless legs   . Schizoaffective disorder ([place])    Pt denies  . Vitamin D deficiency     Past Surgical History:  Procedure Laterality Date  . HERNIA REPAIR     inguinal  . L3 TO L5 LAMINECTOMY FOR DECOMPRESSION  07/17/2016   [place] [facility]  . REPAIR OF CEREBROSPINAL FLUID LEAK N/A 09/08/2016   Procedure: Lumbar wound exploration, repair of pseudomenigocele;  Surgeon: Kevan Ny Ditty, MD;  Location: [place];  Service: Neurosurgery;  Laterality: N/A;  Lumbar wound exploration, repair of pseudomenigocele  . SINUSOTOMY      There were no vitals filed for this visit.      Subjective Assessment - 01/03/17 1301    Subjective Patient reports he stayed busy all last week, he reports his pain is much more tolerable/managable this week. He was unable to try his long walk to the [employer]. He does get a burning/ache in his calves, less in his thighs.    Limitations Sitting;Lifting;Standing;Walking   Diagnostic tests MRI   Patient Stated Goals Get rid of the  stiffness in his back with washing dishes, be able to go out and walk more than 1x per week without burning sensation in his LEs.    Currently in Pain? Other (Comment)  Just soreness in his calves      Ankle DF mobilizations -- 5 bouts bilaterally x 45" each through end range mobilizations/oscillations with no reported feelings of increased pain. Reports increased range of motion/decreased calf soreness.   Incline board calf stretching -- 3 bouts x 45"   TRX squats x 12 (appears to no longer be challenging fo rhim)   Wall squats with TRX -- 2 sets x 8 (much more challenging for him)   Leg Press 65#x 8 reps - with 30" rest breaks x 3 rounds (challenging/fatiguing)                            PT Education - 01/03/17 1256    Education provided Yes   Education Details Will progress HEP in next session, continue stretching calves. Attempt long walk this week.    Person(s) Educated Patient   Methods Explanation;Demonstration   Comprehension Verbalized understanding;Returned demonstration             PT Long Term Goals - 12/11/16 1243      PT LONG TERM GOAL #1  Title Patient will report worst back pain of 3/10 to demonstrate improved tolerance for ADLs.    Time 8   Period Weeks   Status New     PT LONG TERM GOAL #2   Title Patient will perform 6MW test and ambulate > 1,000' with no increase in leg or low back pain to demonstrate improved tolerance for community mobility.    Time 8   Period Weeks   Status New     PT LONG TERM GOAL #3   Title Patient will perform 5x sit to stand in < 13 seconds with no increase in LE pain to demonstrate improved tolerance for ADLs.    Baseline 16.65 seconds with increase in leg pain bilaterally    Time 8   Period Weeks   Status New     PT LONG TERM GOAL #4   Title Patient will report modified ODI of less than 16% disability to demonstrate improved tolerance for ADLs.    Baseline 26%   Time 8   Period Weeks    Status New               Plan - 01/03/17 1257    Clinical Impression Statement Patient continues to tolerate increased workloads with decreased times for rest. He reports pain in his thighs is largely subsiding, his calves are still sore/aching but this is likely due to decreased flexibility/ROM in the Achilles'. He appears to be able to tolerate more activities in the home throughout the day with less rest time.    Rehab Potential Fair   PT Frequency 2x / week   PT Duration 4 weeks   PT Treatment/Interventions Aquatic Therapy;Electrical Stimulation;Ultrasound;Moist Heat;Iontophoresis 4mg /ml Dexamethasone;Gait training;Stair training;Manual techniques;Therapeutic exercise;Therapeutic activities;Balance training;Cryotherapy;Neuromuscular re-education   PT Next Visit Plan Perform 6MW test, increase HEP    PT Home Exercise Plan Supine bridging, hamstring stretching, quad stretching    Consulted and Agree with Plan of Care Patient      Patient will benefit from skilled therapeutic intervention in order to improve the following deficits and impairments:  Pain, Cardiopulmonary status limiting activity, Decreased activity tolerance, Difficulty walking, Decreased strength, Decreased range of motion, Hypomobility, Impaired flexibility, Decreased mobility, Impaired sensation  Visit Diagnosis: Chronic midline low back pain without sciatica  Difficulty in walking, not elsewhere classified  Muscle weakness (generalized)     Problem List Patient Active Problem List   Diagnosis Date Noted  . 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 Lymphedema 07/07/2016  . Hepatitis C 06/01/2016  . Lumbar herniated disc 11/24/2015  . Protein malnutrition ([place]) 11/11/2015  . Thrombocytopenia ([place]) 11/11/2015  . Hepatitis C antibody test positive 07/13/2015  . Chronic constipation 07/13/2015  . Abnormal immunological findings in specimens from other organs, systems and tissues 07/13/2015  . CN  (constipation) 07/13/2015  . Benign hypertension 03/08/2015  . COPD, mild ([place]) 03/08/2015  . Dyslipidemia 03/08/2015  . Paranoid schizophrenia ([place]) 03/08/2015  . Acquired polyneuropathy 03/08/2015  . Restless leg 03/08/2015  . Tobacco abuse 03/08/2015  . Callus of foot 03/08/2015  . Claudication ([place]) 03/08/2015  . Chronic obstructive pulmonary disease ([place]) 03/08/2015  . Corn or callus 03/08/2015  . Essential (primary) hypertension 03/08/2015  . HLD (hyperlipidemia) 03/08/2015  . Peripheral vascular disease ([place]) 03/08/2015  . Polyneuropathy 03/08/2015  . Current tobacco use 03/08/2015  . Vitamin D deficiency 04/26/2009  . Avitaminosis D 04/26/2009   Royce Macadamia PT, DPT, CSCS    01/03/2017, 1:02 PM  [facility] [address], [place], [address] Phone: [phone]   Fax:  [phone]  Name: Mark Nash MRN: [id number] Date of Birth: [birth year]

## 2017-01-08 ENCOUNTER — Ambulatory Visit: Payer: PPO | Admitting: Physical Therapy

## 2017-01-08 ENCOUNTER — Encounter: Payer: Self-pay | Admitting: Physical Therapy

## 2017-01-08 DIAGNOSIS — G8929 Other chronic pain: Secondary | ICD-10-CM

## 2017-01-08 DIAGNOSIS — R262 Difficulty in walking, not elsewhere classified: Secondary | ICD-10-CM

## 2017-01-08 DIAGNOSIS — M6281 Muscle weakness (generalized): Secondary | ICD-10-CM

## 2017-01-08 DIAGNOSIS — M545 Low back pain: Principal | ICD-10-CM

## 2017-01-08 NOTE — Therapy (Signed)
[facility] [address], [place], [address] Phone: [phone]   Fax:  [phone]  Physical Therapy Treatment  Patient Details  Name: Mark Nash MRN: [id number] Date of Birth: 1955/10/17 No Data Recorded  Encounter Date: 01/08/2017      PT End of Session - 01/08/17 1640    Visit Number 9   Number of Visits 13   Date for PT Re-Evaluation 02/05/17   PT Start Time 1640   PT Stop Time 1722   PT Time Calculation (min) 42 min   Activity Tolerance Patient tolerated treatment well   Behavior During Therapy [facility] for tasks assessed/performed      Past Medical History:  Diagnosis Date  . Arthritis   . Asthma   . Constipation   . COPD (chronic obstructive pulmonary disease) ([place])   . Diabetes mellitus without complication (HCC)    Type 2  . Hepatitis C   . History of kidney stones   . Hyperlipidemia   . Hypertension   . Restless legs   . Schizoaffective disorder ([place])    Pt denies  . Vitamin D deficiency     Past Surgical History:  Procedure Laterality Date  . HERNIA REPAIR     inguinal  . L3 TO L5 LAMINECTOMY FOR DECOMPRESSION  07/17/2016   [place] [facility]  . REPAIR OF CEREBROSPINAL FLUID LEAK N/A 09/08/2016   Procedure: Lumbar wound exploration, repair of pseudomenigocele;  Surgeon: Kevan Ny Ditty, MD;  Location: [facility];  Service: Neurosurgery;  Laterality: N/A;  Lumbar wound exploration, repair of pseudomenigocele  . SINUSOTOMY      There were no vitals filed for this visit.      Subjective Assessment - 01/08/17 1642    Subjective Pt reports he went for a 45 minute walk this past Friday which he says went well.  Did not have any issues when walking but did have some burning symptoms once he returned home and was sitting.  This made him very tired that night and next day but this is much improved since starting therapy.  Believes his energy level is getting better  following exercises.    Limitations Sitting;Lifting;Standing;Walking   Diagnostic tests MRI   Patient Stated Goals Get rid of the stiffness in his back with washing dishes, be able to go out and walk more than 1x per week without burning sensation in his LEs.    Currently in Pain? Yes   Pain Score 5    Pain Location Back   Pain Orientation Lower   Pain Descriptors / Indicators Aching   Pain Type Chronic pain   Pain Onset More than a month ago   Pain Frequency Constant   Multiple Pain Sites Yes   Pain Score 7   Pain Location Leg   Pain Orientation Left;Right;Anterior   Pain Descriptors / Indicators Aching   Pain Type Chronic pain   Pain Onset More than a month ago   Pain Frequency Constant       Therapeutic Exercise:  Kettle bell 20# deadlifts 2x10  Paloff press 15# 2x15 with cues for correct posture  Forward lunges onto BOSU ball x15 each LE  Lateral lunges onto BOSU ball x15 each LE  BLE leg press 3x8 with 85#  Robber exercise with 10# with cues for scapular retraction. x10  Quick feet lateral step ups to 4" steps x15 each direction          PT  Education - 01/08/17 1639    Education provided Yes   Education Details Exercise technique; clinical reasoning behind interventions   Person(s) Educated Patient   Methods Explanation;Demonstration;Verbal cues   Comprehension Verbalized understanding;Returned demonstration;Verbal cues required;Need further instruction             PT Long Term Goals - 12/11/16 1243      PT LONG TERM GOAL #1   Title Patient will report worst back pain of 3/10 to demonstrate improved tolerance for ADLs.    Time 8   Period Weeks   Status New     PT LONG TERM GOAL #2   Title Patient will perform 6MW test and ambulate > 1,000' with no increase in leg or low back pain to demonstrate improved tolerance for community mobility.    Time 8   Period Weeks   Status New     PT LONG TERM GOAL #3   Title Patient will perform 5x sit to stand in  < 13 seconds with no increase in LE pain to demonstrate improved tolerance for ADLs.    Baseline 16.65 seconds with increase in leg pain bilaterally    Time 8   Period Weeks   Status New     PT LONG TERM GOAL #4   Title Patient will report modified ODI of less than 16% disability to demonstrate improved tolerance for ADLs.    Baseline 26%   Time 8   Period Weeks   Status New               Plan - 01/08/17 1653    Clinical Impression Statement Pt was able to go for a 45 minute walk painfree over this past weekend.  Symptoms were not felt until pt was at home sitting which may have been a result of transitioning too quickly from exercising to static sitting with cramping.  Instructed pt to walk [place] apartment at a leisurely pace upon returning from walk as a cool down to allow his muscles to relax. He tolerated all interventions well this date, performing exercises to fatigue.  He will benefit from continued skilled PT interventions for improved strength, decreased pain, and improved QOL.   Rehab Potential Fair   PT Frequency 2x / week   PT Duration 4 weeks   PT Treatment/Interventions Aquatic Therapy;Electrical Stimulation;Ultrasound;Moist Heat;Iontophoresis 4mg /ml Dexamethasone;Gait training;Stair training;Manual techniques;Therapeutic exercise;Therapeutic activities;Balance training;Cryotherapy;Neuromuscular re-education   PT Next Visit Plan Perform 6MW test, increase HEP    PT Home Exercise Plan Supine bridging, hamstring stretching, quad stretching    Consulted and Agree with Plan of Care Patient      Patient will benefit from skilled therapeutic intervention in order to improve the following deficits and impairments:  Pain, Cardiopulmonary status limiting activity, Decreased activity tolerance, Difficulty walking, Decreased strength, Decreased range of motion, Hypomobility, Impaired flexibility, Decreased mobility, Impaired sensation  Visit Diagnosis: Chronic midline low  back pain without sciatica  Difficulty in walking, not elsewhere classified  Muscle weakness (generalized)     Problem List Patient Active Problem List   Diagnosis Date Noted  . 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 Lymphedema 07/07/2016  . Hepatitis C 06/01/2016  . Lumbar herniated disc 11/24/2015  . Protein malnutrition ([place]) 11/11/2015  . Thrombocytopenia ([place]) 11/11/2015  . Hepatitis C antibody test positive 07/13/2015  . Chronic constipation 07/13/2015  . Abnormal immunological findings in specimens from other organs, systems and tissues 07/13/2015  . CN (constipation) 07/13/2015  . Benign hypertension 03/08/2015  . COPD, mild ([place])  03/08/2015  . Dyslipidemia 03/08/2015  . Paranoid schizophrenia ([place]) 03/08/2015  . Acquired polyneuropathy 03/08/2015  . Restless leg 03/08/2015  . Tobacco abuse 03/08/2015  . Callus of foot 03/08/2015  . Claudication ([place]) 03/08/2015  . Chronic obstructive pulmonary disease ([place]) 03/08/2015  . Corn or callus 03/08/2015  . Essential (primary) hypertension 03/08/2015  . HLD (hyperlipidemia) 03/08/2015  . Peripheral vascular disease ([place]) 03/08/2015  . Polyneuropathy 03/08/2015  . Current tobacco use 03/08/2015  . Vitamin D deficiency 04/26/2009  . Avitaminosis D 04/26/2009    Collie Siad PT, DPT 01/08/2017, 5:26 PM  [facility] [address], [place], [address] Phone: [phone]   Fax:  [phone]  Name: Mark Nash MRN: [id number] Date of Birth: 1956-08-07

## 2017-01-19 ENCOUNTER — Ambulatory Visit: Payer: PPO | Admitting: Physical Therapy

## 2017-01-19 DIAGNOSIS — M545 Low back pain, unspecified: Secondary | ICD-10-CM

## 2017-01-19 DIAGNOSIS — R262 Difficulty in walking, not elsewhere classified: Secondary | ICD-10-CM

## 2017-01-19 DIAGNOSIS — M6281 Muscle weakness (generalized): Secondary | ICD-10-CM

## 2017-01-19 DIAGNOSIS — G8929 Other chronic pain: Secondary | ICD-10-CM

## 2017-01-19 NOTE — Therapy (Signed)
[facility] [address] [place], [address] Phone: [phone]   Fax:  [phone]  Physical Therapy Treatment  Patient Details  Name: Mark Nash MRN: [id number] Date of Birth: 1956-06-18 No Data Recorded  Encounter Date: 01/19/2017      PT End of Session - 01/19/17 1300    Visit Number 10   Number of Visits 13   Date for PT Re-Evaluation 02/05/17   PT Start Time 1245   PT Stop Time 1330   PT Time Calculation (min) 45 min   Activity Tolerance Patient tolerated treatment well   Behavior During Therapy [facility] for tasks assessed/performed      Past Medical History:  Diagnosis Date  . Arthritis   . Asthma   . Constipation   . COPD (chronic obstructive pulmonary disease) ([place])   . Diabetes mellitus without complication (HCC)    Type 2  . Hepatitis C   . History of kidney stones   . Hyperlipidemia   . Hypertension   . Restless legs   . Schizoaffective disorder ([place])    Pt denies  . Vitamin D deficiency     Past Surgical History:  Procedure Laterality Date  . HERNIA REPAIR     inguinal  . L3 TO L5 LAMINECTOMY FOR DECOMPRESSION  07/17/2016   [place] [facility]  . REPAIR OF CEREBROSPINAL FLUID LEAK N/A 09/08/2016   Procedure: Lumbar wound exploration, repair of pseudomenigocele;  Surgeon: Kevan Ny Ditty, MD;  Location: [facility];  Service: Neurosurgery;  Laterality: N/A;  Lumbar wound exploration, repair of pseudomenigocele  . SINUSOTOMY      There were no vitals filed for this visit.      Subjective Assessment - 01/19/17 1249    Subjective Patient reports he has been busy helping out with his mother and sister. Patient reports he has been coming off of his pain medications, reports he has been taking muscle relaxers which are the only thing outside of exercise helping him currently.    Limitations Sitting;Lifting;Standing;Walking   Diagnostic tests MRI   Patient Stated  Goals Get rid of the stiffness in his back with washing dishes, be able to go out and walk more than 1x per week without burning sensation in his LEs.    Currently in Pain? No/denies      5x sit to stand - 10.13 seconds (with no increase in leg pain, no use of UEs)   1,000 feet in 6MW test in 3:50 with no increase in leg pain, just a feeling of "tired/weak".   Standing quad stretch with belt x 5 per side for 5-10" holds, well tolerated  Standing calf stretch  X 3 per side for 10" holds, reports feeling appropriate stretching sensation   1/2 kneeling hip flexor stretch with pillow beneath knee, cued to go through ROM where he felt in anterior thigh but not back, appropriate response reported   Single leg deadlifts with 5# DB x 8 per side (cuing to increase leg excursion into extension, otherwise well tolerated   Sit to stands with 2, 5# DBs x 10 (challenging, but appropriate)   Modified side planks x 5, cuing for less spinal rotation, reports as challenging in obliques and glutes as expected   Single leg bridging x 5 per side, required cuing on LLE to have toes up and have RLE go through partial range so as not to flare up low back symptoms.  PT Education - 01/29/17 1303    Education provided Yes   Education Details Progressed home program, added stretches for when he becomes sore/achey.    Person(s) Educated Patient   Methods Demonstration;Explanation;Handout   Comprehension Verbalized understanding;Returned demonstration             PT Long Term Goals - 01-29-17 1252      PT LONG TERM GOAL #1   Title Patient will report worst back pain of 3/10 to demonstrate improved tolerance for ADLs.    Baseline No back pain for the past 3-4 days.    Time 8   Period Weeks   Status Achieved     PT LONG TERM GOAL #2   Title Patient will perform 6MW test and ambulate > 1,000' with no increase in leg or low back pain to demonstrate  improved tolerance for community mobility.    Baseline Completed 1,000 feet in 3:50   Time 8   Period Weeks   Status Achieved     PT LONG TERM GOAL #3   Title Patient will perform 5x sit to stand in < 13 seconds with no increase in LE pain to demonstrate improved tolerance for ADLs.    Baseline 16.65 seconds with increase in leg pain bilaterally.    Time 8   Period Weeks   Status Achieved     PT LONG TERM GOAL #4   Title Patient will report modified ODI of less than 16% disability to demonstrate improved tolerance for ADLs.    Baseline 26% at baseline, 20% on 2023/01/30    Time 8   Period Weeks   Status Partially Met               Plan - 2017-01-29 1300    Clinical Impression Statement Patient has made good progress with outcome measures, he is reporting much less pain in his back than prior to therapy. He has leg pain still, though this may be chronic and related to vascular insufficiency given his PVD and long history of tobacco use. He was provided with stretching and strengthening program for home to reduce these symptoms.    Rehab Potential Fair   PT Frequency 2x / week   PT Duration 4 weeks   PT Treatment/Interventions Aquatic Therapy;Electrical Stimulation;Ultrasound;Moist Heat;Iontophoresis '4mg'$ /ml Dexamethasone;Gait training;Stair training;Manual techniques;Therapeutic exercise;Therapeutic activities;Balance training;Cryotherapy;Neuromuscular re-education   PT Next Visit Plan Perform 6MW test, increase HEP    PT Home Exercise Plan Supine bridging, hamstring stretching, quad stretching    Consulted and Agree with Plan of Care Patient      Patient will benefit from skilled therapeutic intervention in order to improve the following deficits and impairments:  Pain, Cardiopulmonary status limiting activity, Decreased activity tolerance, Difficulty walking, Decreased strength, Decreased range of motion, Hypomobility, Impaired flexibility, Decreased mobility, Impaired  sensation  Visit Diagnosis: Chronic midline low back pain without sciatica  Difficulty in walking, not elsewhere classified  Muscle weakness (generalized)       G-Codes - 01/29/2017 1349    Functional Assessment Tool Used (Outpatient Only) modified ODI, patient report, 5x sit to stand    Functional Limitation Mobility: Walking and moving around   Mobility: Walking and Moving Around Current Status [phone]) At least 1 percent but less than 20 percent impaired, limited or restricted   Mobility: Walking and Moving Around Goal Status [phone]) At least 1 percent but less than 20 percent impaired, limited or restricted      Problem List Patient Active Problem List  Diagnosis Date Noted  . 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 Lymphedema 07/07/2016  . Hepatitis C 06/01/2016  . Lumbar herniated disc 11/24/2015  . Protein malnutrition ([place]) 11/11/2015  . Thrombocytopenia ([place]) 11/11/2015  . Hepatitis C antibody test positive 07/13/2015  . Chronic constipation 07/13/2015  . Abnormal immunological findings in specimens from other organs, systems and tissues 07/13/2015  . CN (constipation) 07/13/2015  . Benign hypertension 03/08/2015  . COPD, mild ([place]) 03/08/2015  . Dyslipidemia 03/08/2015  . Paranoid schizophrenia ([place]) 03/08/2015  . Acquired polyneuropathy 03/08/2015  . Restless leg 03/08/2015  . Tobacco abuse 03/08/2015  . Callus of foot 03/08/2015  . Claudication ([place]) 03/08/2015  . Chronic obstructive pulmonary disease ([place]) 03/08/2015  . Corn or callus 03/08/2015  . Essential (primary) hypertension 03/08/2015  . HLD (hyperlipidemia) 03/08/2015  . Peripheral vascular disease ([place]) 03/08/2015  . Polyneuropathy 03/08/2015  . Current tobacco use 03/08/2015  . Vitamin D deficiency 04/26/2009  . Avitaminosis D 04/26/2009   Royce Macadamia PT, DPT, CSCS    01/19/2017, 1:50 PM  [facility] [address], [place], [address] Phone: [phone]   Fax:  [phone]  Name: Mark Nash MRN: [id number] Date of Birth: [birth year]

## 2017-01-19 NOTE — Patient Instructions (Addendum)
5x sit to stand - 10.13 seconds (with no increase in leg pain, no use of UEs)   1,000 feet in 6MW test in 3:50 with no increase in leg pain, just a feeling of "tired/weak".   Standing quad stretch with belt   Standing calf stretch   1/2 kneeling hip flexor stretch

## 2017-01-30 ENCOUNTER — Encounter: Payer: PPO | Admitting: Physical Therapy

## 2017-02-06 ENCOUNTER — Ambulatory Visit: Payer: PPO | Admitting: Physical Therapy

## 2017-02-26 ENCOUNTER — Other Ambulatory Visit: Payer: Self-pay | Admitting: Family Medicine

## 2017-02-26 DIAGNOSIS — E785 Hyperlipidemia, unspecified: Secondary | ICD-10-CM

## 2017-02-26 MED ORDER — PRAVASTATIN SODIUM 40 MG PO TABS: 40.0000 mg | ORAL_TABLET | Freq: Every day | ORAL | 1 refills | Status: DC

## 2017-02-26 NOTE — Telephone Encounter (Signed)
Patient requesting refill of Pravastatin to [facility].

## 2017-03-01 ENCOUNTER — Other Ambulatory Visit: Payer: Self-pay | Admitting: Family Medicine

## 2017-03-01 DIAGNOSIS — E785 Hyperlipidemia, unspecified: Secondary | ICD-10-CM

## 2017-03-01 NOTE — Telephone Encounter (Signed)
Patient requesting refill of Pravastatin to [facility].

## 2017-03-12 ENCOUNTER — Other Ambulatory Visit: Payer: Self-pay | Admitting: Family Medicine

## 2017-03-12 DIAGNOSIS — I10 Essential (primary) hypertension: Secondary | ICD-10-CM

## 2017-03-12 NOTE — Telephone Encounter (Signed)
Patient requesting refill of Lisinopril to [facility]. 

## 2017-03-19 ENCOUNTER — Other Ambulatory Visit: Payer: Self-pay | Admitting: Family Medicine

## 2017-03-19 NOTE — Telephone Encounter (Signed)
Pt needs refill on Cyclobenzaprine 10 mg, Diclofenac to be sent to [facility].

## 2017-03-22 NOTE — Telephone Encounter (Signed)
LMOM to inform pt °

## 2017-03-30 ENCOUNTER — Ambulatory Visit (INDEPENDENT_AMBULATORY_CARE_PROVIDER_SITE_OTHER): Payer: PPO | Admitting: Family Medicine

## 2017-03-30 ENCOUNTER — Encounter: Payer: Self-pay | Admitting: Family Medicine

## 2017-03-30 ENCOUNTER — Telehealth: Payer: Self-pay

## 2017-03-30 VITALS — BP 144/90 | HR 85 | Temp 97.6°F | Resp 16 | Ht 71.0 in | Wt 166.9 lb

## 2017-03-30 DIAGNOSIS — Z9889 Other specified postprocedural states: Secondary | ICD-10-CM | POA: Diagnosis not present

## 2017-03-30 DIAGNOSIS — I10 Essential (primary) hypertension: Secondary | ICD-10-CM

## 2017-03-30 DIAGNOSIS — E559 Vitamin D deficiency, unspecified: Secondary | ICD-10-CM | POA: Diagnosis not present

## 2017-03-30 DIAGNOSIS — M5416 Radiculopathy, lumbar region: Secondary | ICD-10-CM

## 2017-03-30 DIAGNOSIS — E119 Type 2 diabetes mellitus without complications: Secondary | ICD-10-CM | POA: Diagnosis not present

## 2017-03-30 DIAGNOSIS — E1140 Type 2 diabetes mellitus with diabetic neuropathy, unspecified: Secondary | ICD-10-CM

## 2017-03-30 DIAGNOSIS — R252 Cramp and spasm: Secondary | ICD-10-CM

## 2017-03-30 LAB — CBC
HCT: 45.4 % (ref 38.5–50.0)
Hemoglobin: 15.7 g/dL (ref 13.2–17.1)
MCH: 31.3 pg (ref 27.0–33.0)
MCHC: 34.6 g/dL (ref 32.0–36.0)
MCV: 90.4 fL (ref 80.0–100.0)
MPV: 9 fL (ref 7.5–12.5)
PLATELETS: 163 10*3/uL (ref 140–400)
RBC: 5.02 MIL/uL (ref 4.20–5.80)
RDW: 13.3 % (ref 11.0–15.0)
WBC: 6.7 10*3/uL (ref 3.8–10.8)

## 2017-03-30 MED ORDER — CARBAMAZEPINE ER 300 MG PO CP12: 300.0000 mg | ORAL_CAPSULE | Freq: Two times a day (BID) | ORAL | 0 refills | Status: DC

## 2017-03-30 MED ORDER — METHOCARBAMOL 750 MG PO TABS: 750.0000 mg | ORAL_TABLET | Freq: Three times a day (TID) | ORAL | 2 refills | Status: DC | PRN

## 2017-03-30 MED ORDER — PREGABALIN 150 MG PO CAPS: 150.0000 mg | ORAL_CAPSULE | Freq: Two times a day (BID) | ORAL | 0 refills | Status: DC

## 2017-03-30 MED ORDER — INSULIN DETEMIR 100 UNIT/ML ~~LOC~~ SOLN: 5.0000 [IU] | Freq: Every evening | SUBCUTANEOUS | 0 refills | Status: DC

## 2017-03-30 NOTE — Addendum Note (Signed)
Addended by: [name] F on: 03/30/2017 11:58 AM   Modules accepted: Orders

## 2017-03-30 NOTE — Progress Notes (Signed)
Name: Mark Nash   MRN: [id number]    DOB: May 30, 1956   Date:03/30/2017       Progress Note  Subjective  Chief Complaint  Chief Complaint  Patient presents with  . Spasms    Since his recent back surgery in November and January he has noticed since coming off of all his medications both of his legs will go into spasms shooting down his calves. They will last for hours and makes it difficult for him to be active and walk. He was on muscle relaxers before the surgery, his legs are aching and burning from the cramping.    HPI  Lumbar radiculitis: he had back surgery in 06/2016 and 08/2016. He was doing well, off most medications, but still on Gabapentin. Two weeks ago  he had severe pain on right lower back, radiating to right foot, he could barely move for a few days. That pain has gone down to 2/10 right now. However over the past two days has noticed increase in leg spasms/bilaterally, he is out of muscle relaxer. He is off diuretics. He states pain affects his sleep and his bp is elevated today   Patient Active Problem List   Diagnosis Date Noted  . 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 Lymphedema 07/07/2016  . Hepatitis C 06/01/2016  . Lumbar herniated disc 11/24/2015  . Protein malnutrition ([place]) 11/11/2015  . Thrombocytopenia ([place]) 11/11/2015  . Hepatitis C antibody test positive 07/13/2015  . Chronic constipation 07/13/2015  . Abnormal immunological findings in specimens from other organs, systems and tissues 07/13/2015  . CN (constipation) 07/13/2015  . Benign hypertension 03/08/2015  . COPD, mild ([place]) 03/08/2015  . Dyslipidemia 03/08/2015  . Paranoid schizophrenia ([place]) 03/08/2015  . Acquired polyneuropathy 03/08/2015  . Restless leg 03/08/2015  . Tobacco abuse 03/08/2015  . Callus of foot 03/08/2015  . Claudication ([place]) 03/08/2015  . Chronic obstructive pulmonary disease ([place]) 03/08/2015  . Corn or callus 03/08/2015  . Essential (primary) hypertension  03/08/2015  . HLD (hyperlipidemia) 03/08/2015  . Peripheral vascular disease ([place]) 03/08/2015  . Polyneuropathy 03/08/2015  . Current tobacco use 03/08/2015  . Vitamin D deficiency 04/26/2009  . Avitaminosis D 04/26/2009    Past Surgical History:  Procedure Laterality Date  . HERNIA REPAIR     inguinal  . L3 TO L5 LAMINECTOMY FOR DECOMPRESSION  07/17/2016   [facility]  . REPAIR OF CEREBROSPINAL FLUID LEAK N/A 09/08/2016   Procedure: Lumbar wound exploration, repair of pseudomenigocele;  Surgeon: Kevan Ny Ditty, MD;  Location: [facility];  Service: Neurosurgery;  Laterality: N/A;  Lumbar wound exploration, repair of pseudomenigocele  . SINUSOTOMY      Family History  Problem Relation Age of Onset  . Hypertension Mother   . Diabetes Father   . Hypertension Father   . Heart disease Father   . Diverticulitis Brother     Social History   Social History  . Marital status: Single    Spouse name: N/A  . Number of children: N/A  . Years of education: N/A   Occupational History  . Not on file.   Social History Main Topics  . Smoking status: Current Every Day Smoker    Packs/day: 1.50    Years: 45.00    Types: Cigarettes  . Smokeless tobacco: Never Used  . Alcohol use No  . Drug use: No  . Sexual activity: Not Currently   Other Topics Concern  . Not on file   Social History Narrative  .  No narrative on file     Current Outpatient Prescriptions:  .  AMITIZA 24 MCG capsule, TAKE ONE CAPSULE BY MOUTH TWICE DAILY WITH MEALS, Disp: 60 capsule, Rfl: 5 .  carbamazepine (CARBATROL) 300 MG 12 hr capsule, Take 1 capsule (300 mg total) by mouth 2 (two) times daily., Disp: 180 capsule, Rfl: 0 .  insulin detemir (LEVEMIR) 100 UNIT/ML injection, Inject 0.05 mLs (5 Units total) into the skin at bedtime. (Patient taking differently: Inject 5 Units into the skin every evening. ), Disp: 15 mL, Rfl: 2 .  lisinopril (PRINIVIL,ZESTRIL) 20 MG tablet, TAKE ONE TABLET  BY MOUTH ONCE DAILY, Disp: 90 tablet, Rfl: 1 .  pravastatin (PRAVACHOL) 40 MG tablet, TAKE ONE TABLET BY MOUTH ONCE DAILY, Disp: 90 tablet, Rfl: 1 .  Vitamin D, Ergocalciferol, (DRISDOL) 50000 units CAPS capsule, TAKE ONE CAPSULE BY MOUTH ONCE A WEEK OR  BIWEKLY  AS  DIRECTED (Patient taking differently: TAKE ONE CAPSULE BY MOUTH EVERY 14 DAYS), Disp: 4 capsule, Rfl: 6 .  diphenhydramine-acetaminophen (TYLENOL PM) 25-500 MG TABS tablet, Take 0.5 tablets by mouth at bedtime as needed (FOR SLEEP)., Disp: , Rfl:  .  methocarbamol (ROBAXIN) 750 MG tablet, Take 1 tablet (750 mg total) by mouth every 8 (eight) hours as needed for muscle spasms., Disp: 90 tablet, Rfl: 2  Allergies  Allergen Reactions  . Aspirin Nausea Only and Other (See Comments)  . Penicillins Hives and Swelling    SWELLING REACTION UNSPECIFIED   Has patient had a PCN reaction causing immediate rash, facial/tongue/throat swelling, SOB or lightheadedness with hypotension: #  #  #  YES  #  #  #  Has patient had a PCN reaction causing severe rash involving mucus membranes or skin necrosis:  #  #  #  UNKNOWN  #  #  #   Has patient had a PCN reaction that required hospitalization:  #  #  #  UNKNOWN  #  #  #  Has patient had a PCN reaction occurring within the last 10 years:  #  #  #  NO  #  #  #      ROS  Ten systems reviewed and is negative except as mentioned in HPI  Objective  Vitals:   03/30/17 1112  BP: (!) 144/90  Pulse: 85  Resp: 16  Temp: 97.6 F (36.4 C)  TempSrc: Oral  SpO2: 98%  Weight: 166 lb 14.4 oz (75.7 kg)  Height: 5\' 11"  (1.803 m)    Body mass index is 23.28 kg/m.  Physical Exam  Constitutional: Patient appears well-developed and well-nourished.No distress.  HEENT: head atraumatic, normocephalic, pupils equal and reactive to light,  neck supple, throat within normal limits Cardiovascular: Normal rate, regular rhythm and normal heart sounds.  No murmur heard. No BLE edema. Pulmonary/Chest: Effort  normal and breath sounds normal. No respiratory distress. Abdominal: Soft.  There is no tenderness. Psychiatric: Patient has a normal mood and affect. behavior is normal. Judgment and thought content normal. Muscular Skeletal: mild pain during palpation of lumbar spine, negative straight leg raise  PHQ2/9: Depression screen [facility] 2/9 03/30/2017 06/30/2016 03/21/2016 12/21/2015 11/10/2015  Decreased Interest 0 0 0 0 0  Down, Depressed, Hopeless 0 0 0 0 0  PHQ - 2 Score 0 0 0 0 0  Altered sleeping - - 0 - -  Tired, decreased energy - - 0 - -  Change in appetite - - 0 - -  Feeling bad or failure about yourself  - -  0 - -  Trouble concentrating - - 0 - -  Moving slowly or fidgety/restless - - 0 - -  Suicidal thoughts - - 0 - -  PHQ-9 Score - - 0 - -     Fall Risk: Fall Risk  03/30/2017 06/30/2016 12/21/2015 11/10/2015 09/16/2015  Falls in the past year? No No No No No     Functional Status Survey: Is the patient deaf or have difficulty hearing?: No Does the patient have difficulty seeing, even when wearing glasses/contacts?: No Does the patient have difficulty concentrating, remembering, or making decisions?: No Does the patient have difficulty walking or climbing stairs?: No Does the patient have difficulty dressing or bathing?: No Does the patient have difficulty doing errands alone such as visiting a doctor's office or shopping?: No    Assessment & Plan  1. Right lumbar radiculitis  - methocarbamol (ROBAXIN) 750 MG tablet; Take 1 tablet (750 mg total) by mouth every 8 (eight) hours as needed for muscle spasms.  Dispense: 90 tablet; Refill: 2 - pregabalin (LYRICA) 150 MG capsule; Take 1 capsule (150 mg total) by mouth 2 (two) times daily. In place of gabapentin, do not fill carbamazepine ( sent by mistake)  Dispense: 60 capsule; Refill: 0   2. History of lumbar surgery   3. Benign hypertension  bp is elevated , he states not sleeping because of pain, he would like to hold off on  adding bp medication. We will re-evaluate on his next visit in a couple of weeks - COMPLETE METABOLIC PANEL WITH GFR - CBC  4. Controlled type 2 diabetes mellitus with neuropathy ([place])  Needs a refill, he is returning in a few weeks for follow up, but out of medication  - insulin detemir (LEVEMIR) 100 UNIT/ML injection; Inject 0.05 mLs (5 Units total) into the skin every evening.  Dispense: 10 mL; Refill: 0 We will change from gabapentin to Lyrica because of coverage, but also because leg pain/cramping can be from worsening of diabetic neuropathy   5. Leg cramping  - COMPLETE METABOLIC PANEL WITH GFR - VITAMIN D 25 Hydroxy (Vit-D Deficiency, Fractures) - Magnesium - Ferritin - CBC

## 2017-03-30 NOTE — Telephone Encounter (Signed)
[facility] faxed [place] a notification stating the methocarbamol 750 mg is on backorder and would like to know is it ok to switch to 500 mg?

## 2017-03-31 LAB — COMPLETE METABOLIC PANEL WITH GFR
ALT: 11 U/L (ref 9–46)
AST: 12 U/L (ref 10–35)
Albumin: 4.1 g/dL (ref 3.6–5.1)
Alkaline Phosphatase: 104 U/L (ref 40–115)
BUN: 10 mg/dL (ref 7–25)
CHLORIDE: 101 mmol/L (ref 98–110)
CO2: 28 mmol/L (ref 20–31)
Calcium: 9 mg/dL (ref 8.6–10.3)
Creat: 0.8 mg/dL (ref 0.70–1.25)
Glucose, Bld: 175 mg/dL — ABNORMAL HIGH (ref 65–99)
Potassium: 4.5 mmol/L (ref 3.5–5.3)
Sodium: 138 mmol/L (ref 135–146)
Total Bilirubin: 1 mg/dL (ref 0.2–1.2)
Total Protein: 6.2 g/dL (ref 6.1–8.1)

## 2017-03-31 LAB — MAGNESIUM: MAGNESIUM: 2 mg/dL (ref 1.5–2.5)

## 2017-03-31 LAB — VITAMIN D 25 HYDROXY (VIT D DEFICIENCY, FRACTURES): Vit D, 25-Hydroxy: 26 ng/mL — ABNORMAL LOW (ref 30–100)

## 2017-03-31 LAB — FERRITIN: Ferritin: 60 ng/mL (ref 20–380)

## 2017-04-01 NOTE — Telephone Encounter (Signed)
Yes, we will need to change on EHR also

## 2017-04-16 ENCOUNTER — Ambulatory Visit (INDEPENDENT_AMBULATORY_CARE_PROVIDER_SITE_OTHER): Payer: PPO | Admitting: Family Medicine

## 2017-04-16 ENCOUNTER — Encounter: Payer: Self-pay | Admitting: Family Medicine

## 2017-04-16 VITALS — BP 132/78 | HR 74 | Temp 98.0°F | Resp 16 | Ht 71.0 in | Wt 165.0 lb

## 2017-04-16 DIAGNOSIS — Z1212 Encounter for screening for malignant neoplasm of rectum: Secondary | ICD-10-CM | POA: Diagnosis not present

## 2017-04-16 DIAGNOSIS — E1140 Type 2 diabetes mellitus with diabetic neuropathy, unspecified: Secondary | ICD-10-CM | POA: Diagnosis not present

## 2017-04-16 DIAGNOSIS — M5416 Radiculopathy, lumbar region: Secondary | ICD-10-CM | POA: Diagnosis not present

## 2017-04-16 DIAGNOSIS — G629 Polyneuropathy, unspecified: Secondary | ICD-10-CM

## 2017-04-16 DIAGNOSIS — Z1211 Encounter for screening for malignant neoplasm of colon: Secondary | ICD-10-CM

## 2017-04-16 DIAGNOSIS — J449 Chronic obstructive pulmonary disease, unspecified: Secondary | ICD-10-CM

## 2017-04-16 DIAGNOSIS — I739 Peripheral vascular disease, unspecified: Secondary | ICD-10-CM | POA: Diagnosis not present

## 2017-04-16 DIAGNOSIS — F200 Paranoid schizophrenia: Secondary | ICD-10-CM

## 2017-04-16 DIAGNOSIS — I10 Essential (primary) hypertension: Secondary | ICD-10-CM

## 2017-04-16 DIAGNOSIS — K5909 Other constipation: Secondary | ICD-10-CM | POA: Diagnosis not present

## 2017-04-16 DIAGNOSIS — B182 Chronic viral hepatitis C: Secondary | ICD-10-CM | POA: Diagnosis not present

## 2017-04-16 LAB — POCT GLYCOSYLATED HEMOGLOBIN (HGB A1C)

## 2017-04-16 NOTE — Progress Notes (Signed)
Name: Mark Nash   MRN: [id number]    DOB: 12/21/1955   Date:04/16/2017       Progress Note  Subjective  Chief Complaint  Chief Complaint  Patient presents with  . Diabetes    4 month follow up pt not checking at all  . Hyperlipidemia    no issues  . Hypertension    no issuses  . paranoid schizophrenia    pt stated that he stopped taking lyrica due to an intolerance headaches, insomnia        HPI  Lumbar radiculitis: he had back surgery in 06/2016 and 08/2016. He was doing well, off most medications, back on on Gabapentin TID (Had stopped to try Lyrica - stopped Lyrica because of insomnia and nausea). Was seen 03/30/2017  he had severe pain on right lower back, radiating to right foot, he could barely move for a few days.  Was given robaxin and is feeling a lot better - pain is tolerable now. That pain has best pain day he was a 2/10 worst pain is 8-9/10, average is 5-6/10.He is off diuretics. He states sleep has improved as well- pain no longer waking him.  He went to see Dr. Cyndy Freeze and had laminectomy   07/17/2016, he is no longer taking hydrocodone for painDr. Ditty last saw him 09/11/2016, and he finished PT back in May 2018.   Needs gabapentin refill today.  DMII: he was advised to stop using Levemir, but he continues to take 5 units daily , hgbA1C was 6.8% but went down to 6.0 I nDecember 2017, today it is 6.7%  He denies polydipsia, polyuria or polyphagia, no recent episodes of hypoglycemia. He is on ace and a statin, and has neuropathy, he is on high dose gabapentin and states burning sensation on his feet has not been severe lalety. He states his feet pain is described as burning when he walks for a long time.  Has never had eye appointment - plans to schedule this soon, declines referral.   Lower extremity edema and history of claudication: symptoms of leg swelling since the end of January 2017 seen by vascular surgeon, per patient studies negative and swelling has been down  since February 2018. He still has some intermittent claudication but seems to be from neuropathic pain. Compression stocking hoses are very helpful but only wearing as needed now; off diuretics.  COPD: he is not on medication, because he refuses, he always has daily productive cough in am's. He denies SOB with activity lately; he is still smoking about 1 and half pack of cigarettes daily but not ready to quit smoking yet - thinking about cutting back. Says his sinuses has been more congested lately - gets seasonal allergies.  Hyperlipidemia: taking Pravastatin, only half pill at night.  Last lipid pane l4/20/2018 and was at goal. No myalgias.  HTN: taking bp medication and denies side effects, bp is at goal, no chest pain or palpitation, vision changes.  Schizophrenia: denies any hallucinations, no depressed mood or suicidal thoughts or ideation, feeling well, not on medication and refuses to see Psychiatrist. No changes at this time.  Hepatitis C antibody positive: he has a remote history of drug use from age 81 till 12. Used injectable drug use at the time, he has positive hepatitis C but continues to refuse to see GI.  Chronic Constipation: he has intermittent periods of constipation. [place] was 1-2, dry and had to strain,  He has been taking Amitiza 24 mcg once daily  since July 2017 and has been having bowel movements 5-7 days a week, no incontinence.  Abdominal pain: he refuses a colonoscopy due to cost but has intermittent pain on LLQ, feels like something is stuck, no blood in stools or dark/tarry stools, no nausea or vomiting, but refuses to see GI, he states he will let me know if it gets worse. Willing to try hemoccult cards at home and agrees to have colonoscopy if this returns as concerning/positive result. Says he has never had colonoscopy.   Patient Active Problem List   Diagnosis Date Noted  . 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 Lymphedema 07/07/2016  .  Hepatitis C 06/01/2016  . Lumbar herniated disc 11/24/2015  . Protein malnutrition ([place]) 11/11/2015  . Thrombocytopenia ([place]) 11/11/2015  . Hepatitis C antibody test positive 07/13/2015  . Chronic constipation 07/13/2015  . Abnormal immunological findings in specimens from other organs, systems and tissues 07/13/2015  . CN (constipation) 07/13/2015  . Benign hypertension 03/08/2015  . COPD, mild ([place]) 03/08/2015  . Dyslipidemia 03/08/2015  . Paranoid schizophrenia ([place]) 03/08/2015  . Acquired polyneuropathy 03/08/2015  . Restless leg 03/08/2015  . Tobacco abuse 03/08/2015  . Callus of foot 03/08/2015  . Claudication ([place]) 03/08/2015  . Chronic obstructive pulmonary disease ([place]) 03/08/2015  . Corn or callus 03/08/2015  . Essential (primary) hypertension 03/08/2015  . HLD (hyperlipidemia) 03/08/2015  . Peripheral vascular disease ([place]) 03/08/2015  . Polyneuropathy 03/08/2015  . Current tobacco use 03/08/2015  . Vitamin D deficiency 04/26/2009  . Avitaminosis D 04/26/2009    Past Surgical History:  Procedure Laterality Date  . HERNIA REPAIR     inguinal  . L3 TO L5 LAMINECTOMY FOR DECOMPRESSION  07/17/2016   [facility]  . REPAIR OF CEREBROSPINAL FLUID LEAK N/A 09/08/2016   Procedure: Lumbar wound exploration, repair of pseudomenigocele;  Surgeon: Kevan Ny Ditty, MD;  Location: [facility];  Service: Neurosurgery;  Laterality: N/A;  Lumbar wound exploration, repair of pseudomenigocele  . SINUSOTOMY      Family History  Problem Relation Age of Onset  . Hypertension Mother   . Diabetes Father   . Hypertension Father   . Heart disease Father   . Diverticulitis Brother     Social History   Social History  . Marital status: Single    Spouse name: N/A  . Number of children: N/A  . Years of education: N/A   Occupational History  . Not on file.   Social History Main Topics  . Smoking status: Current Every Day Smoker    Packs/day: 1.50    Years:  45.00    Types: Cigarettes  . Smokeless tobacco: Never Used  . Alcohol use No  . Drug use: No  . Sexual activity: Not Currently   Other Topics Concern  . Not on file   Social History Narrative  . No narrative on file     Current Outpatient Prescriptions:  .  AMITIZA 24 MCG capsule, TAKE ONE CAPSULE BY MOUTH TWICE DAILY WITH MEALS, Disp: 60 capsule, Rfl: 5 .  gabapentin (NEURONTIN) 100 MG capsule, Take 100 mg by mouth 3 (three) times daily., Disp: , Rfl:  .  insulin detemir (LEVEMIR) 100 UNIT/ML injection, Inject 0.05 mLs (5 Units total) into the skin every evening., Disp: 10 mL, Rfl: 0 .  lisinopril (PRINIVIL,ZESTRIL) 20 MG tablet, TAKE ONE TABLET BY MOUTH ONCE DAILY, Disp: 90 tablet, Rfl: 1 .  methocarbamol (ROBAXIN) 750 MG tablet, , Disp: , Rfl:  .  pravastatin (  PRAVACHOL) 40 MG tablet, TAKE ONE TABLET BY MOUTH ONCE DAILY, Disp: 90 tablet, Rfl: 1 .  Vitamin D, Ergocalciferol, (DRISDOL) 50000 units CAPS capsule, TAKE ONE CAPSULE BY MOUTH ONCE A WEEK OR  BIWEKLY  AS  DIRECTED (Patient taking differently: TAKE ONE CAPSULE BY MOUTH EVERY 14 DAYS), Disp: 4 capsule, Rfl: 6  Allergies  Allergen Reactions  . Aspirin Nausea Only and Other (See Comments)  . Penicillins Hives and Swelling    SWELLING REACTION UNSPECIFIED   Has patient had a PCN reaction causing immediate rash, facial/tongue/throat swelling, SOB or lightheadedness with hypotension: #  #  #  YES  #  #  #  Has patient had a PCN reaction causing severe rash involving mucus membranes or skin necrosis:  #  #  #  UNKNOWN  #  #  #   Has patient had a PCN reaction that required hospitalization:  #  #  #  UNKNOWN  #  #  #  Has patient had a PCN reaction occurring within the last 10 years:  #  #  #  NO  #  #  #   . Lyrica [Pregabalin] Other (See Comments)    intolerance     ROS  Constitutional: Negative for fever or weight change.  Respiratory: Negative for cough and shortness of breath.   Cardiovascular: Negative for chest  pain or palpitations.  Gastrointestinal: Negative for abdominal pain, no bowel changes.  Musculoskeletal: Negative for gait problem or joint swelling.  Skin: Negative for rash.  Neurological: Negative for dizziness or headache.  No other specific complaints in a complete review of systems (except as listed in HPI above).  Objective  Vitals:   04/16/17 1332  BP: 132/78  Pulse: 74  Resp: 16  Temp: 98 F (36.7 C)  SpO2: 98%  Weight: 165 lb (74.8 kg)  Height: 5\' 11"  (1.803 m)   Body mass index is 23.01 kg/m.  Physical Exam   Constitutional: Patient appears well-developed and well-nourished. Obese No distress.  HEENT: head atraumatic, normocephalic, pupils equal and reactive to light, neck supple, throat within normal limits Cardiovascular: Normal rate, regular rhythm and normal heart sounds.  No murmur heard. No BLE edema. Pulmonary/Chest: Effort normal and breath sounds normal. No respiratory distress. Abdominal: Soft.  There is no tenderness. Psychiatric: Patient has a normal mood and affect. behavior is normal. Judgment and thought content normal.    Recent Results (from the past 2160 hour(s))  COMPLETE METABOLIC PANEL WITH GFR     Status: Abnormal   Collection Time: 03/30/17 12:04 PM  Result Value Ref Range   Sodium 138 135 - 146 mmol/L   Potassium 4.5 3.5 - 5.3 mmol/L   Chloride 101 98 - 110 mmol/L   CO2 28 20 - 31 mmol/L   Glucose, Bld 175 (H) 65 - 99 mg/dL   BUN 10 7 - 25 mg/dL   Creat 0.80 0.70 - 1.25 mg/dL    Comment:   For patients > or = 62 years of age: The upper reference limit for Creatinine is approximately 13% higher for people identified as African-American.      Total Bilirubin 1.0 0.2 - 1.2 mg/dL   Alkaline Phosphatase 104 40 - 115 U/L   AST 12 10 - 35 U/L   ALT 11 9 - 46 U/L   Total Protein 6.2 6.1 - 8.1 g/dL   Albumin 4.1 3.6 - 5.1 g/dL   Calcium 9.0 8.6 - 10.3 mg/dL   GFR,  Est African American >89 >=60 mL/min   GFR, Est Non African American  >89 >=60 mL/min  VITAMIN D 25 Hydroxy (Vit-D Deficiency, Fractures)     Status: Abnormal   Collection Time: 03/30/17 12:04 PM  Result Value Ref Range   Vit D, 25-Hydroxy 26 (L) 30 - 100 ng/mL    Comment: Vitamin D Status           25-OH Vitamin D        Deficiency                <20 ng/mL        Insufficiency         20 - 29 ng/mL        Optimal             > or = 30 ng/mL   For 25-OH Vitamin D testing on patients on D2-supplementation and patients for whom quantitation of D2 and D3 fractions is required, the QuestAssureD 25-OH VIT D, (D2,D3), LC/MS/MS is recommended: order code [id number] (patients > 2 yrs).   Magnesium     Status: None   Collection Time: 03/30/17 12:04 PM  Result Value Ref Range   Magnesium 2.0 1.5 - 2.5 mg/dL  Ferritin     Status: None   Collection Time: 03/30/17 12:04 PM  Result Value Ref Range   Ferritin 60 20 - 380 ng/mL  CBC     Status: None   Collection Time: 03/30/17 12:04 PM  Result Value Ref Range   WBC 6.7 3.8 - 10.8 K/uL   RBC 5.02 4.20 - 5.80 MIL/uL   Hemoglobin 15.7 13.2 - 17.1 g/dL   HCT 45.4 38.5 - 50.0 %   MCV 90.4 80.0 - 100.0 fL   MCH 31.3 27.0 - 33.0 pg   MCHC 34.6 32.0 - 36.0 g/dL   RDW 13.3 11.0 - 15.0 %   Platelets 163 140 - 400 K/uL   MPV 9.0 7.5 - 12.5 fL  POCT glycosylated hemoglobin (Hb A1C)     Status: Abnormal   Collection Time: 04/16/17  4:51 PM  Result Value Ref Range   Hemoglobin A1C 6.7%      PHQ2/9: Depression screen [facility] 2/9 03/30/2017 06/30/2016 03/21/2016 12/21/2015 11/10/2015  Decreased Interest 0 0 0 0 0  Down, Depressed, Hopeless 0 0 0 0 0  PHQ - 2 Score 0 0 0 0 0  Altered sleeping - - 0 - -  Tired, decreased energy - - 0 - -  Change in appetite - - 0 - -  Feeling bad or failure about yourself  - - 0 - -  Trouble concentrating - - 0 - -  Moving slowly or fidgety/restless - - 0 - -  Suicidal thoughts - - 0 - -  PHQ-9 Score - - 0 - -    Fall Risk: Fall Risk  03/30/2017 06/30/2016 12/21/2015 11/10/2015 09/16/2015  Falls  in the past year? No No No No No   Assessment & Plan  1. Controlled type 2 diabetes mellitus with neuropathy (HCC) - POCT glycosylated hemoglobin (Hb A1C)  2. Right lumbar radiculitis - Will call pharmacy to inquire about refills of gabapentin, if he has none, will have them send [place] a request.  3. Essential (primary) hypertension Continue Lisinopril  4. Peripheral vascular disease (HCC) Wear compression stockings PRN  5. COPD, mild (HCC) Stable - continues to refuse medications  6. Chronic constipation - Hemoccult Cards x3 - Continues to refuse GI referral  7. Screening for colorectal cancer - Hemoccult Cards x3, he wants to cheapest form of screen  8. Paranoid schizophrenia ([place]) Stable, refuses Psychiatry referral  9. Chronic hepatitis C without hepatic coma (HCC) Refuses GI referral  10. Acquired polyneuropathy - Will call pharmacy to inquire about refills of gabapentin, if he has none, will have them send [place] a request.  -Reviewed Health Maintenance: Cologuard ordered. Patient to call to schedule eye appointment,   Saw the patient with Raelyn Ensign NP-C

## 2017-04-16 NOTE — Patient Instructions (Signed)
Please call eye doctor for examination - call our office if you would like a referral.

## 2017-04-26 ENCOUNTER — Other Ambulatory Visit: Payer: Self-pay | Admitting: Family Medicine

## 2017-04-26 NOTE — Telephone Encounter (Signed)
Pt states that he was told to call if [facility] [address] never received his prescription for gabapentin. He states they do not have it and is asking for you to resend.

## 2017-04-27 MED ORDER — GABAPENTIN 100 MG PO CAPS: 100.0000 mg | ORAL_CAPSULE | Freq: Three times a day (TID) | ORAL | 0 refills | Status: DC

## 2017-05-17 DIAGNOSIS — M5441 Lumbago with sciatica, right side: Secondary | ICD-10-CM | POA: Diagnosis not present

## 2017-05-17 DIAGNOSIS — I10 Essential (primary) hypertension: Secondary | ICD-10-CM | POA: Diagnosis not present

## 2017-05-21 ENCOUNTER — Ambulatory Visit: Payer: PPO

## 2017-06-11 ENCOUNTER — Other Ambulatory Visit: Payer: Self-pay | Admitting: Family Medicine

## 2017-06-11 NOTE — Telephone Encounter (Signed)
Refill request for general medication for Gabapentin for 90 days supply to [facility].  Last office visit:04/16/2017  Next visit:  07/17/2017.

## 2017-06-18 ENCOUNTER — Other Ambulatory Visit: Payer: Self-pay | Admitting: Family Medicine

## 2017-06-18 ENCOUNTER — Ambulatory Visit: Payer: Self-pay

## 2017-06-18 NOTE — Telephone Encounter (Signed)
Patient requesting refill of Vitamin D to [facility].

## 2017-06-19 ENCOUNTER — Ambulatory Visit: Payer: Self-pay

## 2017-06-25 ENCOUNTER — Other Ambulatory Visit: Payer: Self-pay | Admitting: Family Medicine

## 2017-06-25 NOTE — Telephone Encounter (Signed)
Refill request for general medication: Methocarbamol 750 mg  Last office visit: 04/16/2017  Last physical exam: None indicated  Next appt: 07/17/2017

## 2017-07-03 DIAGNOSIS — B353 Tinea pedis: Secondary | ICD-10-CM | POA: Diagnosis not present

## 2017-07-03 DIAGNOSIS — B351 Tinea unguium: Secondary | ICD-10-CM | POA: Diagnosis not present

## 2017-07-03 DIAGNOSIS — E1142 Type 2 diabetes mellitus with diabetic polyneuropathy: Secondary | ICD-10-CM | POA: Diagnosis not present

## 2017-07-03 DIAGNOSIS — L851 Acquired keratosis [keratoderma] palmaris et plantaris: Secondary | ICD-10-CM | POA: Diagnosis not present

## 2017-07-04 ENCOUNTER — Emergency Department: Payer: PPO

## 2017-07-04 ENCOUNTER — Emergency Department
Admission: EM | Admit: 2017-07-04 | Discharge: 2017-07-04 | Disposition: A | Discharge status: Home / Self Care | Payer: PPO | Attending: Emergency Medicine | Admitting: Emergency Medicine

## 2017-07-04 ENCOUNTER — Other Ambulatory Visit: Payer: Self-pay

## 2017-07-04 DIAGNOSIS — M25562 Pain in left knee: Secondary | ICD-10-CM | POA: Diagnosis not present

## 2017-07-04 DIAGNOSIS — Z794 Long term (current) use of insulin: Secondary | ICD-10-CM | POA: Diagnosis not present

## 2017-07-04 DIAGNOSIS — E119 Type 2 diabetes mellitus without complications: Secondary | ICD-10-CM | POA: Diagnosis not present

## 2017-07-04 DIAGNOSIS — I10 Essential (primary) hypertension: Secondary | ICD-10-CM | POA: Insufficient documentation

## 2017-07-04 DIAGNOSIS — J449 Chronic obstructive pulmonary disease, unspecified: Secondary | ICD-10-CM | POA: Insufficient documentation

## 2017-07-04 DIAGNOSIS — F17210 Nicotine dependence, cigarettes, uncomplicated: Secondary | ICD-10-CM | POA: Diagnosis not present

## 2017-07-04 DIAGNOSIS — Z79899 Other long term (current) drug therapy: Secondary | ICD-10-CM | POA: Diagnosis not present

## 2017-07-04 DIAGNOSIS — J45909 Unspecified asthma, uncomplicated: Secondary | ICD-10-CM | POA: Diagnosis not present

## 2017-07-04 MED ORDER — TRAMADOL HCL 50 MG PO TABS
50.0000 mg | ORAL_TABLET | Freq: Once | ORAL | Status: AC
  Administered 2017-07-04: 50 mg via ORAL
  Filled 2017-07-04: qty 1

## 2017-07-04 MED ORDER — MELOXICAM 15 MG PO TABS: 15.0000 mg | ORAL_TABLET | Freq: Every day | ORAL | 0 refills | Status: DC

## 2017-07-04 NOTE — ED Provider Notes (Signed)
[facility] Emergency Department Provider Note ____________________________________________  Time seen: Approximately 10:19 PM  I have reviewed the triage vital signs and the nursing notes.   HISTORY  Chief Complaint Knee Pain    HPI Mark Nash is a 61 y.o. male who presents to the emergency department for evaluation of left knee pain. He denies recent injury. He states that the pain started in May 2018 after doing some physical therapy. Pain has been intermittent since that time. Over the past few days, he has noticed that the knee has felt as if it were locking up. He states that he is unable to stand for any length of time without pain increasing. He has not taken any medications to help alleviate this pain.  Past Medical History:  Diagnosis Date  . Arthritis   . Asthma   . Constipation   . COPD (chronic obstructive pulmonary disease) ([place])   . Diabetes mellitus without complication (HCC)    Type 2  . Hepatitis C   . History of kidney stones   . Hyperlipidemia   . Hypertension   . Restless legs   . Schizoaffective disorder ([place])    Pt denies  . Vitamin D deficiency     Patient Active Problem List   Diagnosis Date Noted  . 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 Lymphedema 07/07/2016  . Hepatitis C 06/01/2016  . Lumbar herniated disc 11/24/2015  . Protein malnutrition ([place]) 11/11/2015  . Thrombocytopenia ([place]) 11/11/2015  . Hepatitis C antibody test positive 07/13/2015  . Chronic constipation 07/13/2015  . Abnormal immunological findings in specimens from other organs, systems and tissues 07/13/2015  . CN (constipation) 07/13/2015  . Benign hypertension 03/08/2015  . COPD, mild ([place]) 03/08/2015  . Dyslipidemia 03/08/2015  . Paranoid schizophrenia ([place]) 03/08/2015  . Acquired polyneuropathy 03/08/2015  . Restless leg 03/08/2015  . Tobacco abuse 03/08/2015  . Callus of foot 03/08/2015  . Claudication ([place]) 03/08/2015  .  Chronic obstructive pulmonary disease ([place]) 03/08/2015  . Corn or callus 03/08/2015  . Essential (primary) hypertension 03/08/2015  . HLD (hyperlipidemia) 03/08/2015  . Peripheral vascular disease ([place]) 03/08/2015  . Polyneuropathy 03/08/2015  . Current tobacco use 03/08/2015  . Vitamin D deficiency 04/26/2009  . Avitaminosis D 04/26/2009    Past Surgical History:  Procedure Laterality Date  . HERNIA REPAIR     inguinal  . L3 TO L5 LAMINECTOMY FOR DECOMPRESSION  07/17/2016   [facility]  . SINUSOTOMY      Prior to Admission medications   Medication Sig Start Date End Date Taking? Authorizing Provider  AMITIZA 24 MCG capsule TAKE ONE CAPSULE BY MOUTH TWICE DAILY WITH MEALS 11/19/16   Ancil Boozer, Drue Stager, MD  gabapentin (NEURONTIN) 100 MG capsule Take 1 capsule (100 mg total) by mouth 3 (three) times daily. 06/11/17 09/09/17  [name], MD  insulin detemir (LEVEMIR) 100 UNIT/ML injection Inject 0.05 mLs (5 Units total) into the skin every evening. 03/30/17   [name], MD  lisinopril (PRINIVIL,ZESTRIL) 20 MG tablet TAKE ONE TABLET BY MOUTH ONCE DAILY 03/12/17   [name], MD  meloxicam (MOBIC) 15 MG tablet Take 1 tablet (15 mg total) daily by mouth. 07/04/17   Kenden Brandt B, FNP  methocarbamol (ROBAXIN) 750 MG tablet TAKE ONE TABLET BY MOUTH EVERY 8 HOURS AS NEEDED FOR MUSCLE SPASMS. 06/25/17   [name], MD  pravastatin (PRAVACHOL) 40 MG tablet TAKE ONE TABLET BY MOUTH ONCE DAILY 03/01/17   [name], MD  Vitamin  D, Ergocalciferol, (DRISDOL) 50000 units CAPS capsule TAKE ONE CAPSULE BY MOUTH ONCE A WEEK OR  BIWEEKLY  AS  DIRECTED 06/18/17   [name], MD    Allergies Aspirin; Penicillins; and Lyrica [pregabalin]  Family History  Problem Relation Age of Onset  . Hypertension Mother   . Diabetes Father   . Hypertension Father   . Heart disease Father   . Diverticulitis Brother     Social History Social History   Tobacco Use   . Smoking status: Current Every Day Smoker    Packs/day: 1.50    Years: 45.00    Pack years: 67.50    Types: Cigarettes  . Smokeless tobacco: Never Used  Substance Use Topics  . Alcohol use: No    Alcohol/week: 0.0 oz  . Drug use: No    Review of Systems Constitutional: Negative for fever. Cardiovascular: Negative for chest pain. Respiratory: Negative for shortness breath. Musculoskeletal: Positive for left knee pain. Skin: Negative for edema, rash, lesion, or wound.  Neurological: Negative for confusion or change in cognition.  ____________________________________________   PHYSICAL EXAM:  VITAL SIGNS: ED Triage Vitals  Enc Vitals Group     BP 07/04/17 2158 (!) 162/76     Pulse Rate 07/04/17 2158 71     Resp 07/04/17 2158 19     Temp 07/04/17 2158 98.2 F (36.8 C)     Temp Source 07/04/17 2158 Oral     SpO2 07/04/17 2158 100 %     Weight 07/04/17 2152 165 lb (74.8 kg)     Height 07/04/17 2152 5\' 11"  (1.803 m)     Head Circumference --      Peak Flow --      Pain Score 07/04/17 2152 10     Pain Loc --      Pain Edu? --      Excl. in [place]? --     Constitutional: Alert and oriented. Well appearing and in no acute distress. Eyes: Conjunctivae clear without discharge or drainage.  Head: Atraumatic  Neck: Full, active range of motion observed. Respiratory: Respirations are even and unlabored. Musculoskeletal: Left knee exam: Straight leg raise is possible without assistance, negative anterior drawer test, negative ballottement test, pain increases with varus stressors. Neurologic: Awake, alert, oriented 4.  Skin: Left knee demonstrates no edema, erythema, wounds, or lesions.  Psychiatric: Mood, behavior, and affect are appropriate.  ____________________________________________   LABS (all labs ordered are listed, but only abnormal results are displayed)  Labs Reviewed - No data to display ____________________________________________  RADIOLOGY  Left knee  image demonstrates no acute bony abnormality per radiology. ____________________________________________   PROCEDURES  Procedure(s) performed: Knee immobilizer applied by RN. Patient neurovascularly intact post-application.  ____________________________________________   INITIAL IMPRESSION / ASSESSMENT AND PLAN / ED COURSE  Mark Nash is a 61 y.o. male who presents to the emergency department for evaluation of intermittent left knee pain. Images tonight did not demonstrate any acute abnormality. He will be given a prescription for meloxicam and encouraged to wear the knee immobilizer while out of bed for the next week or so. He was instructed to call and schedule a follow-up appointment with orthopedics if he does not feel he has improved over the week. He was encouraged to return to the emergency department for symptoms that change or worsen if he is unable to schedule an appointment with his primary care provider or the specialist.  Pertinent labs & imaging results that were available during my care of  the patient were reviewed by me and considered in my medical decision making (see chart for details).  _________________________________________   FINAL CLINICAL IMPRESSION(S) / ED DIAGNOSES  Final diagnoses:  Acute pain of left knee    If controlled substance prescribed during this visit, 12 month history viewed on the [place] prior to issuing an initial prescription for Schedule II or III opiod.    Victorino Dike, FNP 07/05/17 0001    Arta Silence, MD 07/07/17 [phone]

## 2017-07-04 NOTE — Discharge Instructions (Signed)
Follow up with orthopedics for symptoms that are not improving over a week. Return to the emergency department for symptoms that change or worsen if you're unable to schedule an appointment.

## 2017-07-04 NOTE — ED Triage Notes (Signed)
Pt presents to ED via POV with c/o LEFT knee pain x2-3 days with no known injury or trauma. Pt reports pain does decrease with rest, and mobility loosens it up, as well. Pt reports pain is intermittent, "goes from 0 to 10 to 0 without reason". Pt is A&O, in NAD; RR even, regular, and unlabored.

## 2017-07-04 NOTE — ED Notes (Signed)
Pt presents today with Left Knee pain. Pt states it has gotten worse over the day. Pt is alert and ambulatory.

## 2017-07-05 ENCOUNTER — Other Ambulatory Visit: Payer: Self-pay

## 2017-07-05 NOTE — Patient Outreach (Signed)
Spoke with patient per ED follow up call. Patient states recent visit is due to consistent pain in knee after having back surgery January 2018. Patient states he is aware to follow up with PCP and is pleased with care as he has seen PCP for over 8 years. CMA informed patient of [facility] services and patient completed engagement tool. Successful letter, [facility] magnet, and know before you go will be sent to patient.

## 2017-07-06 ENCOUNTER — Other Ambulatory Visit: Payer: Self-pay | Admitting: *Deleted

## 2017-07-06 NOTE — Patient Outreach (Signed)
[facility]) Care Management  07/06/2017  FLOYD WADE Jan 03, 1956 [id number]   Telephone Screen  Referral Date: 07/05/2017 Referral Source: [place] Reason for Referral: 1 or more ED visits in 6 months Insurance: [employer]   Outreach Attempt: Successful telephone outreach to patient.  HIPAA confirmed.  [facility] services explained to patient.  Patient declined screening process and [facility] services.  Plan:  Patient currently declining [place]  Patient encouraged to review successful outreach letter and pamphlet and to contact [facility] in the future if he feels he would benefit from services.   [place] [phone] [email or web address]

## 2017-07-17 ENCOUNTER — Ambulatory Visit: Payer: PPO | Admitting: Family Medicine

## 2017-07-17 ENCOUNTER — Encounter: Payer: Self-pay | Admitting: Family Medicine

## 2017-07-17 VITALS — BP 138/68 | HR 68 | Temp 98.0°F | Resp 16 | Ht 71.0 in | Wt 160.6 lb

## 2017-07-17 DIAGNOSIS — M5440 Lumbago with sciatica, unspecified side: Secondary | ICD-10-CM | POA: Diagnosis not present

## 2017-07-17 DIAGNOSIS — I10 Essential (primary) hypertension: Secondary | ICD-10-CM

## 2017-07-17 DIAGNOSIS — Z23 Encounter for immunization: Secondary | ICD-10-CM | POA: Diagnosis not present

## 2017-07-17 DIAGNOSIS — J449 Chronic obstructive pulmonary disease, unspecified: Secondary | ICD-10-CM

## 2017-07-17 DIAGNOSIS — F200 Paranoid schizophrenia: Secondary | ICD-10-CM | POA: Diagnosis not present

## 2017-07-17 DIAGNOSIS — E785 Hyperlipidemia, unspecified: Secondary | ICD-10-CM

## 2017-07-17 DIAGNOSIS — B182 Chronic viral hepatitis C: Secondary | ICD-10-CM

## 2017-07-17 DIAGNOSIS — I739 Peripheral vascular disease, unspecified: Secondary | ICD-10-CM

## 2017-07-17 DIAGNOSIS — G8929 Other chronic pain: Secondary | ICD-10-CM | POA: Diagnosis not present

## 2017-07-17 DIAGNOSIS — K5909 Other constipation: Secondary | ICD-10-CM | POA: Diagnosis not present

## 2017-07-17 DIAGNOSIS — Z1211 Encounter for screening for malignant neoplasm of colon: Secondary | ICD-10-CM | POA: Diagnosis not present

## 2017-07-17 DIAGNOSIS — E1140 Type 2 diabetes mellitus with diabetic neuropathy, unspecified: Secondary | ICD-10-CM | POA: Diagnosis not present

## 2017-07-17 LAB — POCT UA - MICROALBUMIN: MICROALBUMIN (UR) POC: 50 mg/L

## 2017-07-17 LAB — POCT GLYCOSYLATED HEMOGLOBIN (HGB A1C): HEMOGLOBIN A1C: 6.7

## 2017-07-17 MED ORDER — PRAVASTATIN SODIUM 40 MG PO TABS: 40.0000 mg | ORAL_TABLET | Freq: Every day | ORAL | 1 refills | Status: DC

## 2017-07-17 MED ORDER — METHOCARBAMOL 750 MG PO TABS: ORAL_TABLET | ORAL | 2 refills | Status: DC

## 2017-07-17 MED ORDER — INSULIN DETEMIR 100 UNIT/ML ~~LOC~~ SOLN: 5.0000 [IU] | Freq: Every evening | SUBCUTANEOUS | 0 refills | Status: DC

## 2017-07-17 MED ORDER — LISINOPRIL 20 MG PO TABS: 20.0000 mg | ORAL_TABLET | Freq: Every day | ORAL | 1 refills | Status: DC

## 2017-07-17 MED ORDER — GABAPENTIN 300 MG PO CAPS: 300.0000 mg | ORAL_CAPSULE | Freq: Three times a day (TID) | ORAL | 0 refills | Status: DC

## 2017-07-17 NOTE — Progress Notes (Signed)
Name: Mark Nash   MRN: [id number]    DOB: 1956/08/23   Date:07/17/2017       Progress Note  Subjective  Chief Complaint  Chief Complaint  Patient presents with  . Medication Refill  . Diabetes    Does not check his sugar at home, seen Podiatrist last week due to dry feet  . Hypertension    Denies any symptoms  . Hyperlipidemia  . Schizophrenia  . Constipation    Doing well with medication, having bowel movement daily    HPI  Lumbar radiculitis: he had back surgery in 06/2016 and 08/2016. He was doing well, off most medications, back on on Gabapentin TID (Had stopped to try Lyrica - stopped Lyrica because of insomnia He also takes Robaxin and uses heating pad at times. He still has leg pain daily, and even with gabapentin pain has been worse lately, keeping him up at night, advised to adjust dose of gabapentin to a goal of 300 mg three times daily   DMII: he is still on Levemir 5 units daily , hgbA1C was 6.8% but went down to 6.0 I nDecember 2017, up to 6.7% today is 6.8% He denies polydipsia, polyuria or polyphagia. He states he has some hypoglycemia before dinner - usually when he skips afternoon snack. He is on ace and a statin, and has neuropathy. He states pain is worse and we will adjust dose of Gabapentin   Lower extremity edema and history of claudication: symptoms of leg swelling since the end of January 2017seen by vascular surgeon, per patient studies negative and swelling has been down since February 2018. He still has some intermittent claudication but seems to be from neuropathic pain. Compression stocking hoses are very helpful but only wearing as needed now; off diuretics. He has been doing much better now.  COPD: he is not on medication, because he refuses, he always has daily productive cough in am's.He denies SOB with activity lately; he is still smoking about 1 and half pack of cigarettes daily but not ready to quit smoking yet   Hyperlipidemia: taking  Pravastatin, only half pill at night.  Last lipid pane 12/15/2016 and was at goal. No myalgias.  HTN: taking bp medication and denies side effects, bp is at goal, no chest pain or palpitation, vision changes.  Schizophrenia: denies any hallucinations, no depressed mood or suicidal thoughts or ideation, feeling well, not on medication and refuses to see Psychiatrist. No changes at this time.  Hepatitis C antibody positive: he has a remote history of drug use from age 44 till 20. Used injectable drug use at the time, he has positive hepatitis C but continues to refuse to see GI.  Chronic Constipation: he has intermittent periods of constipation. [place] was1-2, dry and hadto strain, He has been taking Amitiza 24 mcg oncedaily since July 2017 and has been having bowel movements 5-7 days a week, no incontinence. He states he is willing to have colonoscopy done now    Patient Active Problem List   Diagnosis Date Noted  . 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 Lymphedema 07/07/2016  . Hepatitis C 06/01/2016  . Lumbar herniated disc 11/24/2015  . Protein malnutrition ([place]) 11/11/2015  . Thrombocytopenia ([place]) 11/11/2015  . Hepatitis C antibody test positive 07/13/2015  . Chronic constipation 07/13/2015  . Abnormal immunological findings in specimens from other organs, systems and tissues 07/13/2015  . CN (constipation) 07/13/2015  . Benign hypertension 03/08/2015  . COPD, mild ([place]) 03/08/2015  . Dyslipidemia 03/08/2015  .  Paranoid schizophrenia ([place]) 03/08/2015  . Acquired polyneuropathy 03/08/2015  . Restless leg 03/08/2015  . Tobacco abuse 03/08/2015  . Callus of foot 03/08/2015  . Claudication ([place]) 03/08/2015  . Chronic obstructive pulmonary disease ([place]) 03/08/2015  . Corn or callus 03/08/2015  . Essential (primary) hypertension 03/08/2015  . HLD (hyperlipidemia) 03/08/2015  . Peripheral vascular disease ([place]) 03/08/2015  . Polyneuropathy 03/08/2015  . Current  tobacco use 03/08/2015  . Vitamin D deficiency 04/26/2009  . Avitaminosis D 04/26/2009    Past Surgical History:  Procedure Laterality Date  . HERNIA REPAIR     inguinal  . L3 TO L5 LAMINECTOMY FOR DECOMPRESSION  07/17/2016   [facility]  . REPAIR OF CEREBROSPINAL FLUID LEAK N/A 09/08/2016   Procedure: Lumbar wound exploration, repair of pseudomenigocele;  Surgeon: Kevan Ny Ditty, MD;  Location: [place];  Service: Neurosurgery;  Laterality: N/A;  Lumbar wound exploration, repair of pseudomenigocele  . SINUSOTOMY      Family History  Problem Relation Age of Onset  . Hypertension Mother   . Diabetes Father   . Hypertension Father   . Heart disease Father   . Diverticulitis Brother     Social History   Socioeconomic History  . Marital status: Single    Spouse name: Not on file  . Number of children: Not on file  . Years of education: Not on file  . Highest education level: Not on file  Social Needs  . Financial resource strain: Not on file  . Food insecurity - worry: Not on file  . Food insecurity - inability: Not on file  . Transportation needs - medical: Not on file  . Transportation needs - non-medical: Not on file  Occupational History  . Not on file  Tobacco Use  . Smoking status: Current Every Day Smoker    Packs/day: 1.50    Years: 45.00    Pack years: 67.50    Types: Cigarettes  . Smokeless tobacco: Never Used  Substance and Sexual Activity  . Alcohol use: No    Alcohol/week: 0.0 oz  . Drug use: No  . Sexual activity: Not Currently  Other Topics Concern  . Not on file  Social History Narrative  . Not on file     Current Outpatient Medications:  .  AMITIZA 24 MCG capsule, TAKE ONE CAPSULE BY MOUTH TWICE DAILY WITH MEALS, Disp: 60 capsule, Rfl: 5 .  gabapentin (NEURONTIN) 100 MG capsule, Take 1 capsule (100 mg total) by mouth 3 (three) times daily., Disp: 270 capsule, Rfl: 0 .  insulin detemir (LEVEMIR) 100 UNIT/ML injection,  Inject 0.05 mLs (5 Units total) into the skin every evening., Disp: 10 mL, Rfl: 0 .  lisinopril (PRINIVIL,ZESTRIL) 20 MG tablet, Take 1 tablet (20 mg total) by mouth daily., Disp: 90 tablet, Rfl: 1 .  meloxicam (MOBIC) 15 MG tablet, Take 1 tablet (15 mg total) daily by mouth., Disp: 30 tablet, Rfl: 0 .  methocarbamol (ROBAXIN) 750 MG tablet, TAKE ONE TABLET BY MOUTH EVERY 8 HOURS AS NEEDED FOR MUSCLE SPASMS., Disp: 90 tablet, Rfl: 2 .  pravastatin (PRAVACHOL) 40 MG tablet, Take 1 tablet (40 mg total) by mouth daily., Disp: 90 tablet, Rfl: 1 .  urea (CARMOL) 40 % CREA, Apply topically., Disp: , Rfl:  .  Vitamin D, Ergocalciferol, (DRISDOL) 50000 units CAPS capsule, TAKE ONE CAPSULE BY MOUTH ONCE A WEEK OR  BIWEEKLY  AS  DIRECTED, Disp: 4 capsule, Rfl: 6  Allergies  Allergen Reactions  .  Aspirin Nausea Only and Other (See Comments)  . Penicillins Hives and Swelling    SWELLING REACTION UNSPECIFIED   Has patient had a PCN reaction causing immediate rash, facial/tongue/throat swelling, SOB or lightheadedness with hypotension: #  #  #  YES  #  #  #  Has patient had a PCN reaction causing severe rash involving mucus membranes or skin necrosis:  #  #  #  UNKNOWN  #  #  #   Has patient had a PCN reaction that required hospitalization:  #  #  #  UNKNOWN  #  #  #  Has patient had a PCN reaction occurring within the last 10 years:  #  #  #  NO  #  #  #   . Lyrica [Pregabalin] Other (See Comments)    intolerance     ROS  Constitutional: Negative for fever , positive for mild weight change.  Respiratory: Positive  for cough and shortness of breath.   Cardiovascular: Negative for chest pain or palpitations.  Gastrointestinal: Negative for abdominal pain, no bowel changes.  Musculoskeletal: Positive for intermittent gait problem or joint swelling.  Skin: Negative for rash.  Neurological: Negative for dizziness or headache.  No other specific complaints in a complete review of systems (except as  listed in HPI above).  Objective  Vitals:   07/17/17 1347  BP: 138/68  Pulse: 68  Resp: 16  Temp: 98 F (36.7 C)  TempSrc: Oral  SpO2: 96%  Weight: 160 lb 9.6 oz (72.8 kg)  Height: 5\' 11"  (1.803 m)    Body mass index is 22.4 kg/m.  Physical Exam  Constitutional: Patient appears well-developed and well-nourished.  No distress.  HEENT: head atraumatic, normocephalic, pupils equal and reactive to light, neck supple, throat within normal limits Cardiovascular: Normal rate, regular rhythm and normal heart sounds.  No murmur heard. No BLE edema. Pulmonary/Chest: Effort normal and breath sounds normal. No respiratory distress. Abdominal: Soft.  There is no tenderness. Skin: skin is smooth, always increase in erythema, no hair on lower extremity  Psychiatric: Patient has a normal mood and affect. behavior is normal. Judgment and thought content normal.  Recent Results (from the past 2160 hour(s))  POCT HgB A1C     Status: None   Collection Time: 07/17/17  2:05 PM  Result Value Ref Range   Hemoglobin A1C 6.7       PHQ2/9: Depression screen [facility] 2/9 07/17/2017 03/30/2017 06/30/2016 03/21/2016 12/21/2015  Decreased Interest 0 0 0 0 0  Down, Depressed, Hopeless 0 0 0 0 0  PHQ - 2 Score 0 0 0 0 0  Altered sleeping - - - 0 -  Tired, decreased energy - - - 0 -  Change in appetite - - - 0 -  Feeling bad or failure about yourself  - - - 0 -  Trouble concentrating - - - 0 -  Moving slowly or fidgety/restless - - - 0 -  Suicidal thoughts - - - 0 -  PHQ-9 Score - - - 0 -     Fall Risk: Fall Risk  07/17/2017 03/30/2017 06/30/2016 12/21/2015 11/10/2015  Falls in the past year? No No No No No     Functional Status Survey: Is the patient deaf or have difficulty hearing?: No Does the patient have difficulty seeing, even when wearing glasses/contacts?: No Does the patient have difficulty concentrating, remembering, or making decisions?: No Does the patient have difficulty walking or  climbing stairs?: No Does  the patient have difficulty dressing or bathing?: No Does the patient have difficulty doing errands alone such as visiting a doctor's office or shopping?: No   Assessment & Plan  1. Controlled type 2 diabetes with neuropathy (HCC)  - POCT HgB A1C - POCT UA - Microalbumin  2. Need for immunization against influenza  - Flu Vaccine QUAD 6+ mos PF IM (Fluarix Quad PF)  3. Benign hypertension  - lisinopril (PRINIVIL,ZESTRIL) 20 MG tablet; Take 1 tablet (20 mg total) by mouth daily.  Dispense: 90 tablet; Refill: 1  4. Controlled type 2 diabetes mellitus with neuropathy (HCC)  - insulin detemir (LEVEMIR) 100 UNIT/ML injection; Inject 0.05 mLs (5 Units total) into the skin every evening.  Dispense: 10 mL; Refill: 0  5. Dyslipidemia  - pravastatin (PRAVACHOL) 40 MG tablet; Take 1 tablet (40 mg total) by mouth daily.  Dispense: 90 tablet; Refill: 1  6. COPD, mild ([place])  Still smoking, not on inhalers by choice  7. Chronic hepatitis C without hepatic coma (HCC)  Refuses therapy   8. Peripheral vascular disease ([place])  stable  9. Chronic constipation  Taking Amitiza once a day, but still has constipaton   10. Paranoid schizophrenia ([place])  stable  11. Colon cancer screening  - Ambulatory referral to Gastroenterology  12. Chronic bilateral low back pain with sciatica, sciatica laterality unspecified  - methocarbamol (ROBAXIN) 750 MG tablet; TAKE ONE TABLET BY MOUTH EVERY 8 HOURS AS NEEDED FOR MUSCLE SPASMS.  Dispense: 90 tablet; Refill: 2

## 2017-07-24 ENCOUNTER — Other Ambulatory Visit: Payer: Self-pay | Admitting: Family Medicine

## 2017-07-24 DIAGNOSIS — B353 Tinea pedis: Secondary | ICD-10-CM | POA: Diagnosis not present

## 2017-07-24 DIAGNOSIS — E1142 Type 2 diabetes mellitus with diabetic polyneuropathy: Secondary | ICD-10-CM | POA: Diagnosis not present

## 2017-07-24 DIAGNOSIS — L309 Dermatitis, unspecified: Secondary | ICD-10-CM | POA: Diagnosis not present

## 2017-07-25 DIAGNOSIS — M25562 Pain in left knee: Secondary | ICD-10-CM | POA: Diagnosis not present

## 2017-07-26 ENCOUNTER — Other Ambulatory Visit: Payer: Self-pay | Admitting: Family Medicine

## 2017-08-01 DIAGNOSIS — M25562 Pain in left knee: Secondary | ICD-10-CM | POA: Diagnosis not present

## 2017-08-02 ENCOUNTER — Other Ambulatory Visit: Payer: Self-pay | Admitting: Orthopedic Surgery

## 2017-08-02 DIAGNOSIS — M25562 Pain in left knee: Secondary | ICD-10-CM

## 2017-08-09 ENCOUNTER — Ambulatory Visit
Admission: RE | Admit: 2017-08-09 | Discharge: 2017-08-09 | Disposition: A | Discharge status: Home / Self Care | Payer: PPO | Source: Ambulatory Visit | Attending: Orthopedic Surgery | Admitting: Orthopedic Surgery

## 2017-08-09 DIAGNOSIS — M25562 Pain in left knee: Secondary | ICD-10-CM | POA: Insufficient documentation

## 2017-08-09 DIAGNOSIS — C946 Myelodysplastic disease, not classified: Secondary | ICD-10-CM | POA: Diagnosis not present

## 2017-08-09 DIAGNOSIS — M25462 Effusion, left knee: Secondary | ICD-10-CM | POA: Insufficient documentation

## 2017-08-22 DIAGNOSIS — M25562 Pain in left knee: Secondary | ICD-10-CM | POA: Diagnosis not present

## 2017-08-27 ENCOUNTER — Telehealth: Payer: Self-pay | Admitting: Gastroenterology

## 2017-08-27 NOTE — Telephone Encounter (Signed)
Patient called and is ready to schedule colonoscopy.

## 2017-08-29 ENCOUNTER — Other Ambulatory Visit: Payer: Self-pay

## 2017-08-29 DIAGNOSIS — Z1211 Encounter for screening for malignant neoplasm of colon: Secondary | ICD-10-CM

## 2017-08-29 MED ORDER — NA SULFATE-K SULFATE-MG SULF 17.5-3.13-1.6 GM/177ML PO SOLN: 1.0000 | Freq: Once | ORAL | 0 refills | Status: AC

## 2017-08-29 NOTE — Telephone Encounter (Signed)
Gastroenterology Pre-Procedure Review  Request Date: 09/07/17 Requesting Physician: Dr. Bonna Gains  PATIENT REVIEW QUESTIONS: The patient responded to the following health history questions as indicated:    1. Are you having any GI issues? yes (Stomach pains) 2. Do you have a personal history of Polyps? no 3. Do you have a family history of Colon Cancer or Polyps? no 4. Diabetes Mellitus? yes (pt takes insulin) 5. Joint replacements in the past 12 months?yes (back surgery last year) 6. Major health problems in the past 3 months?no 7. Any artificial heart valves, MVP, or defibrillator?no    MEDICATIONS & ALLERGIES:    Patient reports the following regarding taking any anticoagulation/antiplatelet therapy:   Plavix, Coumadin, Eliquis, Xarelto, Lovenox, Pradaxa, Brilinta, or Effient? no Aspirin? no  Patient confirms/reports the following medications:  Current Outpatient Medications  Medication Sig Dispense Refill  . AMITIZA 24 MCG capsule TAKE ONE CAPSULE BY MOUTH TWICE DAILY WITH MEALS 60 capsule 5  . gabapentin (NEURONTIN) 300 MG capsule Take 1 capsule (300 mg total) by mouth 3 (three) times daily. 270 capsule 0  . insulin detemir (LEVEMIR) 100 UNIT/ML injection Inject 0.05 mLs (5 Units total) into the skin every evening. 10 mL 0  . lisinopril (PRINIVIL,ZESTRIL) 20 MG tablet Take 1 tablet (20 mg total) by mouth daily. 90 tablet 1  . meloxicam (MOBIC) 15 MG tablet Take 1 tablet (15 mg total) daily by mouth. 30 tablet 0  . methocarbamol (ROBAXIN) 750 MG tablet TAKE ONE TABLET BY MOUTH EVERY 8 HOURS AS NEEDED FOR MUSCLE SPASMS. 90 tablet 2  . pravastatin (PRAVACHOL) 40 MG tablet Take 1 tablet (40 mg total) by mouth daily. 90 tablet 1  . urea (CARMOL) 40 % CREA Apply topically.    . Vitamin D, Ergocalciferol, (DRISDOL) 50000 units CAPS capsule TAKE ONE CAPSULE BY MOUTH ONCE A WEEK OR  BIWEEKLY  AS  DIRECTED 4 capsule 6   No current facility-administered medications for this visit.      Patient confirms/reports the following allergies:  Allergies  Allergen Reactions  . Aspirin Nausea Only and Other (See Comments)  . Penicillins Hives and Swelling    SWELLING REACTION UNSPECIFIED   Has patient had a PCN reaction causing immediate rash, facial/tongue/throat swelling, SOB or lightheadedness with hypotension: #  #  #  YES  #  #  #  Has patient had a PCN reaction causing severe rash involving mucus membranes or skin necrosis:  #  #  #  UNKNOWN  #  #  #   Has patient had a PCN reaction that required hospitalization:  #  #  #  UNKNOWN  #  #  #  Has patient had a PCN reaction occurring within the last 10 years:  #  #  #  NO  #  #  #   . Lyrica [Pregabalin] Other (See Comments)    intolerance    No orders of the defined types were placed in this encounter.   AUTHORIZATION INFORMATION Primary Insurance: 1D#: Group #:  Secondary Insurance: 1D#: Group #:  SCHEDULE INFORMATION: Date: 09/07/17 Time: Location:[facility]

## 2017-09-04 DIAGNOSIS — R936 Abnormal findings on diagnostic imaging of limbs: Secondary | ICD-10-CM | POA: Diagnosis not present

## 2017-09-04 DIAGNOSIS — G8929 Other chronic pain: Secondary | ICD-10-CM | POA: Insufficient documentation

## 2017-09-04 DIAGNOSIS — I739 Peripheral vascular disease, unspecified: Secondary | ICD-10-CM | POA: Diagnosis not present

## 2017-09-04 DIAGNOSIS — M19042 Primary osteoarthritis, left hand: Secondary | ICD-10-CM

## 2017-09-04 DIAGNOSIS — M25562 Pain in left knee: Secondary | ICD-10-CM | POA: Diagnosis not present

## 2017-09-04 DIAGNOSIS — M19041 Primary osteoarthritis, right hand: Secondary | ICD-10-CM | POA: Insufficient documentation

## 2017-09-07 ENCOUNTER — Encounter
Admission: RE | Disposition: A | Discharge status: Home / Self Care | Payer: Self-pay | Source: Ambulatory Visit | Attending: Gastroenterology

## 2017-09-07 ENCOUNTER — Ambulatory Visit: Payer: PPO | Admitting: Anesthesiology

## 2017-09-07 ENCOUNTER — Ambulatory Visit
Admission: RE | Admit: 2017-09-07 | Discharge: 2017-09-07 | Disposition: A | Discharge status: Home / Self Care | Payer: PPO | Source: Ambulatory Visit | Attending: Gastroenterology | Admitting: Gastroenterology

## 2017-09-07 ENCOUNTER — Encounter: Payer: Self-pay | Admitting: Anesthesiology

## 2017-09-07 DIAGNOSIS — Z7982 Long term (current) use of aspirin: Secondary | ICD-10-CM | POA: Insufficient documentation

## 2017-09-07 DIAGNOSIS — K635 Polyp of colon: Secondary | ICD-10-CM

## 2017-09-07 DIAGNOSIS — D122 Benign neoplasm of ascending colon: Secondary | ICD-10-CM

## 2017-09-07 DIAGNOSIS — Z1211 Encounter for screening for malignant neoplasm of colon: Secondary | ICD-10-CM | POA: Diagnosis not present

## 2017-09-07 DIAGNOSIS — E119 Type 2 diabetes mellitus without complications: Secondary | ICD-10-CM | POA: Diagnosis not present

## 2017-09-07 DIAGNOSIS — Z87442 Personal history of urinary calculi: Secondary | ICD-10-CM | POA: Diagnosis not present

## 2017-09-07 DIAGNOSIS — Z79899 Other long term (current) drug therapy: Secondary | ICD-10-CM | POA: Diagnosis not present

## 2017-09-07 DIAGNOSIS — E1142 Type 2 diabetes mellitus with diabetic polyneuropathy: Secondary | ICD-10-CM | POA: Diagnosis not present

## 2017-09-07 DIAGNOSIS — E785 Hyperlipidemia, unspecified: Secondary | ICD-10-CM | POA: Diagnosis not present

## 2017-09-07 DIAGNOSIS — D125 Benign neoplasm of sigmoid colon: Secondary | ICD-10-CM | POA: Insufficient documentation

## 2017-09-07 DIAGNOSIS — Z794 Long term (current) use of insulin: Secondary | ICD-10-CM | POA: Insufficient documentation

## 2017-09-07 DIAGNOSIS — I10 Essential (primary) hypertension: Secondary | ICD-10-CM | POA: Diagnosis not present

## 2017-09-07 DIAGNOSIS — F17210 Nicotine dependence, cigarettes, uncomplicated: Secondary | ICD-10-CM | POA: Insufficient documentation

## 2017-09-07 DIAGNOSIS — D123 Benign neoplasm of transverse colon: Secondary | ICD-10-CM | POA: Diagnosis not present

## 2017-09-07 DIAGNOSIS — E559 Vitamin D deficiency, unspecified: Secondary | ICD-10-CM | POA: Insufficient documentation

## 2017-09-07 DIAGNOSIS — J449 Chronic obstructive pulmonary disease, unspecified: Secondary | ICD-10-CM | POA: Insufficient documentation

## 2017-09-07 DIAGNOSIS — G2581 Restless legs syndrome: Secondary | ICD-10-CM | POA: Diagnosis not present

## 2017-09-07 DIAGNOSIS — D126 Benign neoplasm of colon, unspecified: Secondary | ICD-10-CM | POA: Diagnosis not present

## 2017-09-07 DIAGNOSIS — F259 Schizoaffective disorder, unspecified: Secondary | ICD-10-CM | POA: Insufficient documentation

## 2017-09-07 DIAGNOSIS — B1920 Unspecified viral hepatitis C without hepatic coma: Secondary | ICD-10-CM | POA: Diagnosis not present

## 2017-09-07 DIAGNOSIS — K5730 Diverticulosis of large intestine without perforation or abscess without bleeding: Secondary | ICD-10-CM

## 2017-09-07 DIAGNOSIS — K5790 Diverticulosis of intestine, part unspecified, without perforation or abscess without bleeding: Secondary | ICD-10-CM | POA: Diagnosis not present

## 2017-09-07 HISTORY — PX: COLONOSCOPY WITH PROPOFOL: SHX5780

## 2017-09-07 LAB — HM COLONOSCOPY

## 2017-09-07 LAB — GLUCOSE, CAPILLARY: GLUCOSE-CAPILLARY: 71 mg/dL (ref 65–99)

## 2017-09-07 SURGERY — COLONOSCOPY WITH PROPOFOL
Anesthesia: General

## 2017-09-07 MED ORDER — PROPOFOL 10 MG/ML IV BOLUS
INTRAVENOUS | Status: DC | PRN
  Administered 2017-09-07: 100 mg via INTRAVENOUS

## 2017-09-07 MED ORDER — PROPOFOL 500 MG/50ML IV EMUL
INTRAVENOUS | Status: AC
  Filled 2017-09-07: qty 50

## 2017-09-07 MED ORDER — PROPOFOL 500 MG/50ML IV EMUL
INTRAVENOUS | Status: DC | PRN
  Administered 2017-09-07: 150 ug/kg/min via INTRAVENOUS

## 2017-09-07 MED ORDER — PHENYLEPHRINE HCL 10 MG/ML IJ SOLN
INTRAMUSCULAR | Status: DC | PRN
  Administered 2017-09-07: 100 ug via INTRAVENOUS

## 2017-09-07 MED ORDER — SODIUM CHLORIDE 0.9 % IV SOLN
INTRAVENOUS | Status: DC
  Administered 2017-09-07: 15:00:00 via INTRAVENOUS
  Administered 2017-09-07: 1000 mL via INTRAVENOUS

## 2017-09-07 NOTE — Anesthesia Post-op Follow-up Note (Signed)
Anesthesia QCDR form completed.        

## 2017-09-07 NOTE — Transfer of Care (Signed)
Immediate Anesthesia Transfer of Care Note  Patient: Mark Nash  Procedure(s) Performed: COLONOSCOPY WITH PROPOFOL (N/A )  Patient Location: PACU  Anesthesia Type:General  Level of Consciousness: awake and sedated  Airway & Oxygen Therapy: Patient Spontanous Breathing and Patient connected to nasal cannula oxygen  Post-op Assessment: Report given to RN and Post -op Vital signs reviewed and stable  Post vital signs: Reviewed and stable  Last Vitals:  Vitals:   09/07/17 1250  BP: (!) 164/94  Pulse: 82  Resp: 16  Temp: 36.5 C  SpO2: 100%    Last Pain:  Vitals:   09/07/17 1250  TempSrc: Oral  PainSc: 2          Complications: No apparent anesthesia complications

## 2017-09-07 NOTE — Anesthesia Preprocedure Evaluation (Addendum)
Anesthesia Evaluation  Patient identified by MRN, date of birth, ID band Patient awake    Reviewed: Allergy & Precautions, NPO status , Patient's Chart, lab work & pertinent test results, reviewed documented beta blocker date and time   Airway Mallampati: III  TM Distance: >3 FB     Dental  (+) Chipped, Missing, Poor Dentition, Dental Advisory Given   Pulmonary asthma , Current Smoker,           Cardiovascular hypertension, Pt. on medications + Peripheral Vascular Disease       Neuro/Psych PSYCHIATRIC DISORDERS Schizophrenia  Neuromuscular disease    GI/Hepatic (+) Hepatitis -, C  Endo/Other  diabetes, Type 2  Renal/GU      Musculoskeletal  (+) Arthritis ,   Abdominal   Peds  Hematology   Anesthesia Other Findings   Reproductive/Obstetrics                            Anesthesia Physical Anesthesia Plan  ASA: III  Anesthesia Plan: General   Post-op Pain Management:    Induction: Intravenous  PONV Risk Score and Plan:   Airway Management Planned:   Additional Equipment:   Intra-op Plan:   Post-operative Plan:   Informed Consent: I have reviewed the patients History and Physical, chart, labs and discussed the procedure including the risks, benefits and alternatives for the proposed anesthesia with the patient or authorized representative who has indicated his/her understanding and acceptance.     Plan Discussed with: CRNA  Anesthesia Plan Comments:         Anesthesia Quick Evaluation

## 2017-09-07 NOTE — Anesthesia Procedure Notes (Signed)
Date/Time: 09/07/2017 2:11 PM Performed by: Nelda Marseille, CRNA Pre-anesthesia Checklist: Patient identified, Emergency Drugs available, Suction available, Patient being monitored and Timeout performed Oxygen Delivery Method: Nasal cannula

## 2017-09-07 NOTE — Op Note (Addendum)
[facility] Patient Name: Mark Nash Procedure Date: 09/07/2017 2:04 PM MRN: [id number] Account #: [id number] Date of Birth: 03/28/1956 Admit Type: Outpatient Age: 62 Room: [facility] ENDO ROOM 2 Gender: Male Note Status: Finalized Procedure:            Colonoscopy Indications:          Screening for colorectal malignant neoplasm Providers:            Naod Sweetland B. Bonna Gains MD, MD Referring MD:         Bethena Roys. Sowles, MD (Referring MD) Medicines:            Monitored Anesthesia Care Complications:        No immediate complications. Procedure:            Pre-Anesthesia Assessment:                       - ASA Grade Assessment: III - A patient with severe                        systemic disease.                       - Prior to the procedure, a History and Physical was                        performed, and patient medications, allergies and                        sensitivities were reviewed. The patient's tolerance of                        previous anesthesia was reviewed.                       - The risks and benefits of the procedure and the                        sedation options and risks were discussed with the                        patient. All questions were answered and informed                        consent was obtained.                       - Patient identification and proposed procedure were                        verified prior to the procedure by the physician, the                        nurse, the anesthesiologist, the anesthetist and the                        technician. The procedure was verified in the procedure                        room.  After obtaining informed consent, the colonoscope was                        passed under direct vision. Throughout the procedure,                        the patient's blood pressure, pulse, and oxygen                        saturations were monitored continuously. The                        Colonoscope was introduced through the anus and                        advanced to the the cecum, identified by appendiceal                        orifice and ileocecal valve. The colonoscopy was                        performed with ease. The patient tolerated the                        procedure well. The quality of the bowel preparation                        was fair. Findings:      The perianal and digital rectal examinations were normal.      Three sessile polyps were found in the transverse colon and ascending       colon. The polyps were 3 to 5 mm in size. These polyps were removed with       a cold biopsy forceps. Resection and retrieval were complete. Mild       oozing was seen at one of the polypectomy sites, and For hemostasis, two       hemostatic clips were successfully placed. There was no bleeding at the       end of the procedure.      A 20 to 25 mm polyp was found in the sigmoid colon. The polyp was       semi-pedunculated. It was at 18 cm from the anal verge and was behind a       fold. It was very floppy in nature and visualization was poor due to its       position. The polyp was removed with a hot snare in two pieces. Both       pieces were retrieved and placed in the same Bottle for pathology. The       base of the polyp may have residual polyp tissue vs. post polypectomy       edema and artifact. Polyp resection thus may be incomplete and residual       polyp may be remaining at the base. Since the position of the polyp is       at 18 cm from the anal verge and in the distal colon, tatoo placement       was not needed. The resected tissue was retrieved.      Multiple small and large-mouthed diverticula were found in the sigmoid       colon and ascending colon. There was no evidence  of diverticular       bleeding.      The exam was otherwise without abnormality.      The rectum, sigmoid colon, descending colon, transverse colon, ascending        colon and cecum appeared normal.      The retroflexed view of the distal rectum and anal verge was normal and       showed no anal or rectal abnormalities. Impression:           - Three 3 to 5 mm polyps in the transverse colon and in                        the ascending colon, removed with a cold biopsy                        forceps. Resected and retrieved. Clips were placed.                       - One 20 to 25 mm polyp in the sigmoid colon, removed                        with a hot snare. Possible Incomplete resection vs post                        polypectomy edema/artifact after piecemeal polypectomy.                        Resected tissue retrieved. No bleeding was present post                        polypectomy at this site. (This was discussed with the                        patient and family, and the importance of repeat                        colonoscopy and follow up were explained in detail.                        They verbalized understanding)                       - Diverticulosis in the sigmoid colon and in the                        ascending colon. There was no evidence of diverticular                        bleeding.                       - The examination was otherwise normal.                       - The rectum, sigmoid colon, descending colon,                        transverse colon, ascending colon and cecum are normal.                       -  The distal rectum and anal verge are normal on                        retroflexion view. Recommendation:       - Discharge patient to home (with escort).                       - Advance diet as tolerated.                       - Continue present medications.                       - Await pathology results.                       - Repeat colonoscopy in 3 months to re-evalaute sigmoid                        colon polyp site for possible further polypectomy from                        the site.                       - The  findings and recommendations were discussed with                        the patient.                       - The findings and recommendations were discussed with                        the patient's family.                       - Return to primary care physician as previously                        scheduled.                       - High fiber diet.                       - Return to my office in 4 weeks. Procedure Code(s):    --- Professional ---                       [phone], 59, Colonoscopy, flexible; with control of                        bleeding, any method                       45385, Colonoscopy, flexible; with removal of tumor(s),                        polyp(s), or other lesion(s) by snare technique Diagnosis Code(s):    --- Professional ---                       Z12.11, Encounter for screening for malignant neoplasm  of colon                       D12.3, Benign neoplasm of transverse colon (hepatic                        flexure or splenic flexure)                       D12.2, Benign neoplasm of ascending colon                       D12.5, Benign neoplasm of sigmoid colon                       K57.30, Diverticulosis of large intestine without                        perforation or abscess without bleeding CPT copyright 2016 American Medical Association. All rights reserved. The codes documented in this report are preliminary and upon coder review may  be revised to meet current compliance requirements.  [name], MD Margretta Sidle B. Bonna Gains MD, MD 09/07/2017 3:35:46 PM This report has been signed electronically. Number of Addenda: 0 Note Initiated On: 09/07/2017 2:04 PM Scope Withdrawal Time: 0 hours 56 minutes 22 seconds  Total Procedure Duration: 1 hour 11 minutes 4 seconds  Estimated Blood Loss: Estimated blood loss: none.      [facility]

## 2017-09-07 NOTE — H&P (Signed)
Mark Antigua, MD [address], [place], [place], [place], [address] [address], [place], [place], [place], [address] Phone: [phone]  Fax: [phone]  Primary Care Physician:  [name], MD   Pre-Procedure History & Physical: HPI:  TAHMIR Nash is a 62 y.o. male is here for an colonoscopy.   Past Medical History:  Diagnosis Date  . Arthritis   . Asthma   . Constipation   . COPD (chronic obstructive pulmonary disease) ([place])   . Diabetes mellitus without complication (HCC)    Type 2  . Hepatitis C   . History of kidney stones   . Hyperlipidemia   . Hypertension   . Restless legs   . Schizoaffective disorder ([place])    Pt denies  . Vitamin D deficiency     Past Surgical History:  Procedure Laterality Date  . HERNIA REPAIR     inguinal  . L3 TO L5 LAMINECTOMY FOR DECOMPRESSION  07/17/2016   [facility]  . REPAIR OF CEREBROSPINAL FLUID LEAK N/A 09/08/2016   Procedure: Lumbar wound exploration, repair of pseudomenigocele;  Surgeon: Kevan Ny Ditty, MD;  Location: [facility];  Service: Neurosurgery;  Laterality: N/A;  Lumbar wound exploration, repair of pseudomenigocele  . SINUSOTOMY      Prior to Admission medications   Medication Sig Start Date End Date Taking? Authorizing Provider  AMITIZA 24 MCG capsule TAKE ONE CAPSULE BY MOUTH TWICE DAILY WITH MEALS 11/19/16  Yes Sowles, Drue Stager, MD  gabapentin (NEURONTIN) 300 MG capsule Take 1 capsule (300 mg total) by mouth 3 (three) times daily. 07/17/17 10/15/17 Yes Sowles, Drue Stager, MD  insulin detemir (LEVEMIR) 100 UNIT/ML injection Inject 0.05 mLs (5 Units total) into the skin every evening. 07/17/17  Yes Sowles, Drue Stager, MD  lisinopril (PRINIVIL,ZESTRIL) 20 MG tablet Take 1 tablet (20 mg total) by mouth daily. 07/17/17  Yes Sowles, Drue Stager, MD  meloxicam (MOBIC) 15 MG tablet Take 1 tablet (15 mg total) daily by mouth. 07/04/17  Yes Triplett, Cari B, FNP  methocarbamol (ROBAXIN) 750 MG  tablet TAKE ONE TABLET BY MOUTH EVERY 8 HOURS AS NEEDED FOR MUSCLE SPASMS. 07/17/17  Yes Sowles, Drue Stager, MD  pravastatin (PRAVACHOL) 40 MG tablet Take 1 tablet (40 mg total) by mouth daily. 07/17/17  Yes Sowles, Drue Stager, MD  urea (CARMOL) 40 % CREA Apply topically. 07/03/17 07/03/18 Yes [provider]  Vitamin D, Ergocalciferol, (DRISDOL) 50000 units CAPS capsule TAKE ONE CAPSULE BY MOUTH ONCE A WEEK OR  BIWEEKLY  AS  DIRECTED 06/18/17  Yes [name], MD    Allergies as of 08/30/2017 - Review Complete 07/17/2017  Allergen Reaction Noted  . Aspirin Nausea Only and Other (See Comments) 03/08/2015  . Penicillins Hives and Swelling 03/08/2015  . Lyrica [pregabalin] Other (See Comments) 04/16/2017    Family History  Problem Relation Age of Onset  . Hypertension Mother   . Diabetes Father   . Hypertension Father   . Heart disease Father   . Diverticulitis Brother     Social History   Socioeconomic History  . Marital status: Single    Spouse name: Not on file  . Number of children: Not on file  . Years of education: Not on file  . Highest education level: Not on file  Social Needs  . Financial resource strain: Not on file  . Food insecurity - worry: Not on file  . Food insecurity - inability: Not on file  . Transportation needs - medical: Not on file  .  Transportation needs - non-medical: Not on file  Occupational History  . Not on file  Tobacco Use  . Smoking status: Current Every Day Smoker    Packs/day: 1.50    Years: 45.00    Pack years: 67.50    Types: Cigarettes  . Smokeless tobacco: Never Used  Substance and Sexual Activity  . Alcohol use: No    Alcohol/week: 0.0 oz  . Drug use: No  . Sexual activity: Not Currently  Other Topics Concern  . Not on file  Social History Narrative  . Not on file    Review of Systems: See HPI, otherwise negative ROS  Physical Exam: BP (!) 164/94   Pulse 82   Temp 97.7 F (36.5 C) (Oral)   Resp 16   Ht 5\' 11"   (1.803 m)   Wt 160 lb (72.6 kg)   SpO2 100%   BMI 22.32 kg/m  General:   Alert,  pleasant and cooperative in NAD Head:  Normocephalic and atraumatic. Neck:  Supple; no masses or thyromegaly. Lungs:  Clear throughout to auscultation, normal respiratory effort.    Heart:  +S1, +S2, Regular rate and rhythm, No edema. Abdomen:  Soft, nontender and nondistended. Normal bowel sounds, without guarding, and without rebound.   Neurologic:  Alert and  oriented x4;  grossly normal neurologically.  Impression/Plan: Mark Nash is here for an colonoscopy to be performed for screening Risks, benefits, limitations, and alternatives regarding  colonoscopy have been reviewed with the patient.  Questions have been answered.  All parties agreeable.   Virgel Manifold, MD  09/07/2017, 1:59 PM

## 2017-09-09 NOTE — Anesthesia Postprocedure Evaluation (Signed)
Anesthesia Post Note  Patient: Mark Nash  Procedure(s) Performed: COLONOSCOPY WITH PROPOFOL (N/A )  Anesthesia Type: General     Last Vitals:  Vitals:   09/07/17 1543 09/07/17 1552  BP: (!) 122/92 (!) 141/97  Pulse: 67 74  Resp: (!) 22 19  Temp:    SpO2: 100% 100%    Last Pain:  Vitals:   09/07/17 1533  TempSrc: Tympanic  PainSc:                  Molli Barrows

## 2017-09-10 ENCOUNTER — Encounter: Payer: Self-pay | Admitting: Gastroenterology

## 2017-09-11 LAB — SURGICAL PATHOLOGY

## 2017-09-12 DIAGNOSIS — R936 Abnormal findings on diagnostic imaging of limbs: Secondary | ICD-10-CM | POA: Diagnosis not present

## 2017-09-12 DIAGNOSIS — M19042 Primary osteoarthritis, left hand: Secondary | ICD-10-CM | POA: Diagnosis not present

## 2017-09-12 DIAGNOSIS — M19041 Primary osteoarthritis, right hand: Secondary | ICD-10-CM | POA: Diagnosis not present

## 2017-09-13 ENCOUNTER — Other Ambulatory Visit: Payer: Self-pay

## 2017-09-13 ENCOUNTER — Telehealth: Payer: Self-pay

## 2017-09-13 NOTE — Telephone Encounter (Signed)
-----   Message from Virgel Manifold, MD sent at 09/07/2017  3:37 PM EST ----- Please have follow up with me in clinic in 4 weeks. Please schedule for Flex-Sig in 2-3 months.

## 2017-09-13 NOTE — Telephone Encounter (Signed)
The diagnosis is follow up large sigmoid polyp

## 2017-09-13 NOTE — Telephone Encounter (Signed)
What diagnosis do we order the Felx-sig? I don't see dyspahgia on his diagnosis.  He has a f/u appt on 10/18/17.

## 2017-09-14 ENCOUNTER — Other Ambulatory Visit: Payer: Self-pay

## 2017-09-14 NOTE — Telephone Encounter (Signed)
Left message for pt to contact office

## 2017-09-17 ENCOUNTER — Telehealth: Payer: Self-pay | Admitting: Gastroenterology

## 2017-09-17 NOTE — Telephone Encounter (Signed)
PATIENT CALLED BACK & L/M ON ANSWERING MACHINE SAID WE WANTED HIM TO CALL AND SCHEDULE A COLONOSCOPY.

## 2017-09-18 ENCOUNTER — Encounter: Payer: Self-pay | Admitting: Gastroenterology

## 2017-09-24 ENCOUNTER — Other Ambulatory Visit: Payer: Self-pay

## 2017-09-24 DIAGNOSIS — D125 Benign neoplasm of sigmoid colon: Secondary | ICD-10-CM

## 2017-09-24 DIAGNOSIS — L851 Acquired keratosis [keratoderma] palmaris et plantaris: Secondary | ICD-10-CM | POA: Diagnosis not present

## 2017-09-24 DIAGNOSIS — E1142 Type 2 diabetes mellitus with diabetic polyneuropathy: Secondary | ICD-10-CM | POA: Diagnosis not present

## 2017-09-24 DIAGNOSIS — B351 Tinea unguium: Secondary | ICD-10-CM | POA: Diagnosis not present

## 2017-09-24 NOTE — Telephone Encounter (Signed)
Pt scheduled for a flex sigmoidoscopy with [facility] on 11/09/17. Pt was mailed instructions for prep.

## 2017-09-24 NOTE — Telephone Encounter (Signed)
-----   Message from Virgel Manifold, MD sent at 09/18/2017  3:44 PM EST ----- Jackelyn Poling please make an appointment for a Flexible Sigmoidoscopy for this patient for 2 months. Indication: Polyp surveillance/Polyp follow up.

## 2017-10-17 ENCOUNTER — Ambulatory Visit: Payer: PPO | Admitting: Family Medicine

## 2017-10-17 ENCOUNTER — Other Ambulatory Visit: Payer: Self-pay | Admitting: Family Medicine

## 2017-10-17 DIAGNOSIS — M5440 Lumbago with sciatica, unspecified side: Principal | ICD-10-CM

## 2017-10-17 DIAGNOSIS — G8929 Other chronic pain: Secondary | ICD-10-CM

## 2017-10-17 NOTE — Telephone Encounter (Signed)
Pt has an appt with dr on 10-24-17 but only has 1 pill left. He needs refill on muscle relaxer Robaxin and Gabapentin. Pt needs this sent to [facility] on [address]. Make sure this has been changed in chart and on al his meds.

## 2017-10-18 ENCOUNTER — Encounter (INDEPENDENT_AMBULATORY_CARE_PROVIDER_SITE_OTHER): Payer: Self-pay

## 2017-10-18 ENCOUNTER — Encounter: Payer: Self-pay | Admitting: Gastroenterology

## 2017-10-18 ENCOUNTER — Ambulatory Visit (INDEPENDENT_AMBULATORY_CARE_PROVIDER_SITE_OTHER): Payer: PPO | Admitting: Gastroenterology

## 2017-10-18 ENCOUNTER — Other Ambulatory Visit: Payer: Self-pay

## 2017-10-18 VITALS — BP 181/85 | HR 70 | Ht 71.0 in | Wt 161.8 lb

## 2017-10-18 DIAGNOSIS — K5909 Other constipation: Secondary | ICD-10-CM | POA: Diagnosis not present

## 2017-10-18 DIAGNOSIS — D126 Benign neoplasm of colon, unspecified: Secondary | ICD-10-CM | POA: Diagnosis not present

## 2017-10-18 DIAGNOSIS — R1012 Left upper quadrant pain: Secondary | ICD-10-CM

## 2017-10-18 DIAGNOSIS — R1013 Epigastric pain: Secondary | ICD-10-CM

## 2017-10-18 MED ORDER — METHOCARBAMOL 750 MG PO TABS: ORAL_TABLET | ORAL | 2 refills | Status: DC

## 2017-10-18 MED ORDER — GABAPENTIN 300 MG PO CAPS: 300.0000 mg | ORAL_CAPSULE | Freq: Three times a day (TID) | ORAL | 0 refills | Status: DC

## 2017-10-18 MED ORDER — POLYETHYLENE GLYCOL 3350 17 G PO PACK: 17.0000 g | PACK | Freq: Every day | ORAL | 0 refills | Status: DC

## 2017-10-18 NOTE — Progress Notes (Signed)
[name], MD [address]  [place]  [place], [place] [address]  Main: [phone]  Fax: [phone]   Primary Care Physician: [name], MD  Primary Gastroenterologist:  Dr. [name]  Chief Complaint  Patient presents with  . Follow-up    4wk post colonoscopy    HPI: Mark Nash is a 62 y.o. male here for post colonoscopy follow up. Pt. Had first screening colonoscopy in Jan 2019 and 3, 3-5 mm polyps were seen in the transverse colon and ascending colon were removed completely and showed tubular adenoma.  A 20-25 mm polyp was seen at 18 cm from the anal verge.  This was semi-pedunculated, was hidden behind a fold, and was floppy in nature.  This was removed piecemeal via hot snare and showed tubulovillous adenoma.  Flexible sigmoidoscopy is planned in March to reevaluate the site and remove any further polyp remaining at the site.  Diverticulosis was also noted on the colonoscopy.  Patient denies any blood in stool, no weight loss, no altered bowel habits.  Patient reports chronic history of left mid abdominal pain, cramping, dull, 3/10, occurs 1 hour after meals, improves after a bowel movement, not associated with any nausea vomiting.  Occurs about 1-2 times a week.  States the pain had completely resolved after the colonoscopy prep and has recurred at this time.  Patient was started on Amitiza by his primary care provider.  Patient reports bowel movements are softer with this medication, however he still has to strain.  Patient is on meloxicam as per his medication list.  No episodes of GI bleeding.  Current Outpatient Medications  Medication Sig Dispense Refill  . AMITIZA 24 MCG capsule TAKE ONE CAPSULE BY MOUTH TWICE DAILY WITH MEALS 60 capsule 5  . insulin detemir (LEVEMIR) 100 UNIT/ML injection Inject 0.05 mLs (5 Units total) into the skin every evening. 10 mL 0  . lisinopril (PRINIVIL,ZESTRIL) 20 MG tablet Take 1 tablet (20 mg total) by  mouth daily. 90 tablet 1  . meloxicam (MOBIC) 15 MG tablet Take 1 tablet (15 mg total) daily by mouth. 30 tablet 0  . pravastatin (PRAVACHOL) 40 MG tablet Take 1 tablet (40 mg total) by mouth daily. 90 tablet 1  . urea (CARMOL) 40 % CREA Apply topically.    . Vitamin D, Ergocalciferol, (DRISDOL) 50000 units CAPS capsule TAKE ONE CAPSULE BY MOUTH ONCE A WEEK OR  BIWEEKLY  AS  DIRECTED 4 capsule 6  . gabapentin (NEURONTIN) 300 MG capsule Take 1 capsule (300 mg total) by mouth 3 (three) times daily. 270 capsule 0  . methocarbamol (ROBAXIN) 750 MG tablet TAKE ONE TABLET BY MOUTH EVERY 8 HOURS AS NEEDED FOR MUSCLE SPASMS. 90 tablet 2  . polyethylene glycol (MIRALAX) packet Take 17 g by mouth daily. 14 each 0   No current facility-administered medications for this visit.     Allergies as of 10/18/2017 - Review Complete 10/18/2017  Allergen Reaction Noted  . Aspirin Nausea Only and Other (See Comments) 03/08/2015  . Penicillins Hives and Swelling 03/08/2015  . Lyrica [pregabalin] Other (See Comments) 04/16/2017    ROS:  General: Negative for anorexia, weight loss, fever, chills, fatigue, weakness. ENT: Negative for hoarseness, difficulty swallowing , nasal congestion. CV: Negative for chest pain, angina, palpitations, dyspnea on exertion, peripheral edema.  Respiratory: Negative for dyspnea at rest, dyspnea on exertion, cough, sputum, wheezing.  GI: See history of present illness. GU:  Negative for dysuria, hematuria, urinary incontinence, urinary frequency, nocturnal  urination.  Endo: Negative for unusual weight change.    Physical Examination:   BP (!) 181/85   Pulse 70   Ht 5\' 11"  (1.803 m)   Wt 161 lb 12.8 oz (73.4 kg)   BMI 22.57 kg/m   General: Well-nourished, well-developed in no acute distress.  Eyes: No icterus. Conjunctivae pink. Mouth: Oropharyngeal mucosa moist and pink , no lesions erythema or exudate. Neck: Supple, Trachea midline Abdomen: Bowel sounds are normal,  nontender, nondistended, no hepatosplenomegaly or masses, no abdominal bruits or hernia , no rebound or guarding.   Cardiac: No lower extremity edema, positive S1, positive S2, RRR Pulmonary: CTA bilaterally, normal respiratory effort Extremities: No lower extremity edema. No clubbing or deformities. Neuro: Alert and oriented x 3.  Grossly intact. Skin: Warm and dry, no jaundice.   Psych: Alert and cooperative, normal mood and affect.   Labs: CMP     Component Value Date/Time   NA 138 03/30/2017 1204   NA 142 11/10/2015 1045   K 4.5 03/30/2017 1204   CL 101 03/30/2017 1204   CO2 28 03/30/2017 1204   GLUCOSE 175 (H) 03/30/2017 1204   BUN 10 03/30/2017 1204   BUN 14 11/10/2015 1045   CREATININE 0.80 03/30/2017 1204   CALCIUM 9.0 03/30/2017 1204   PROT 6.2 03/30/2017 1204   PROT 5.9 (L) 11/10/2015 1045   ALBUMIN 4.1 03/30/2017 1204   ALBUMIN 4.3 11/10/2015 1045   AST 12 03/30/2017 1204   ALT 11 03/30/2017 1204   ALKPHOS 104 03/30/2017 1204   BILITOT 1.0 03/30/2017 1204   BILITOT 1.0 11/10/2015 1045   GFRNONAA >89 03/30/2017 1204   GFRAA >89 03/30/2017 1204   Lab Results  Component Value Date   WBC 6.7 03/30/2017   HGB 15.7 03/30/2017   HCT 45.4 03/30/2017   MCV 90.4 03/30/2017   PLT 163 03/30/2017    Imaging Studies: No results found.  Assessment and Plan:   Mark Nash is a 62 y.o. y/o male here for follow-up from colonoscopy, with large polyp in the sigmoid colon removed via piecemeal polypectomy and pending repeat flex sig in 1 month  No alarm symptoms present at this time No evidence of post polypectomy syndrome We will plan on flex sig as scheduled to reevaluate site of polypectomy  Patient reports chronic intermittent abdominal pain and constipation in the past for which she is on Amitiza by primary care provider We will add MiraLAX daily and high-fiber diet  However, patient is also on meloxicam, is schizophrenic, and poor historian Due to ongoing  abdominal pain, will schedule for EGD to evaluate for any underlying peptic ulcers, evidence of esophagitis.  GERD can also cause abdominal pain, and if esophagitis is present, will start on PPI at that time.  I have discussed alternative options, risks & benefits,  which include, but are not limited to, bleeding, infection, perforation,respiratory complication & drug reaction.  The patient agrees with this plan & written consent will be obtained.     Dr [name]

## 2017-10-18 NOTE — Telephone Encounter (Signed)
Refill request for general medication: Gabapentin 300 mg  Methocarbamol 750 mg  Last office visit: 11/201/2018  Last physical exam: None indicated  Follow-up on file. 10/22/2017

## 2017-10-18 NOTE — Patient Instructions (Signed)
F/U 3 MONTHS   High-Fiber Diet Fiber, also called dietary fiber, is a type of carbohydrate found in fruits, vegetables, whole grains, and beans. A high-fiber diet can have many health benefits. Your health care provider may recommend a high-fiber diet to help:  Prevent constipation. Fiber can make your bowel movements more regular.  Lower your cholesterol.  Relieve hemorrhoids, uncomplicated diverticulosis, or irritable bowel syndrome.  Prevent overeating as part of a weight-loss plan.  Prevent heart disease, type 2 diabetes, and certain cancers.  What is my plan? The recommended daily intake of fiber includes:  38 grams for men under age 79.  85 grams for men over age 59.  80 grams for women under age 53.  5 grams for women over age 72.  You can get the recommended daily intake of dietary fiber by eating a variety of fruits, vegetables, grains, and beans. Your health care provider may also recommend a fiber supplement if it is not possible to get enough fiber through your diet. What do I need to know about a high-fiber diet?  Fiber supplements have not been widely studied for their effectiveness, so it is better to get fiber through food sources.  Always check the fiber content on thenutrition facts label of any prepackaged food. Look for foods that contain at least 5 grams of fiber per serving.  Ask your dietitian if you have questions about specific foods that are related to your condition, especially if those foods are not listed in the following section.  Increase your daily fiber consumption gradually. Increasing your intake of dietary fiber too quickly may cause bloating, cramping, or gas.  Drink plenty of water. Water helps you to digest fiber. What foods can I eat? Grains Whole-grain breads. Multigrain cereal. Oats and oatmeal. Brown rice. Barley. Bulgur wheat. [place]. Bran muffins. Popcorn. Rye wafer crackers. Vegetables Sweet potatoes. Spinach. Kale.  Artichokes. Cabbage. Broccoli. Green peas. Carrots. Squash. Fruits Berries. Pears. Apples. Oranges. Avocados. Prunes and raisins. Dried figs. Meats and Other Protein Sources [place], kidney, pinto, and soy beans. Split peas. Lentils. Nuts and seeds. Dairy Fiber-fortified yogurt. Beverages Fiber-fortified soy milk. Fiber-fortified orange juice. Other Fiber bars. The items listed above may not be a complete list of recommended foods or beverages. Contact your dietitian for more options. What foods are not recommended? Grains White bread. Pasta made with refined flour. White rice. Vegetables Fried potatoes. Canned vegetables. Well-cooked vegetables. Fruits Fruit juice. Cooked, strained fruit. Meats and Other Protein Sources Fatty cuts of meat. Fried Sales executive or fried fish. Dairy Milk. Yogurt. Cream cheese. Sour cream. Beverages Soft drinks. Other Cakes and pastries. Butter and oils. The items listed above may not be a complete list of foods and beverages to avoid. Contact your dietitian for more information. What are some tips for including high-fiber foods in my diet?  Eat a wide variety of high-fiber foods.  Make sure that half of all grains consumed each day are whole grains.  Replace breads and cereals made from refined flour or white flour with whole-grain breads and cereals.  Replace white rice with brown rice, bulgur wheat, or millet.  Start the day with a breakfast that is high in fiber, such as a cereal that contains at least 5 grams of fiber per serving.  Use beans in place of meat in soups, salads, or pasta.  Eat high-fiber snacks, such as berries, raw vegetables, nuts, or popcorn. This information is not intended to replace advice given to you by your health care provider.  Make sure you discuss any questions you have with your health care provider. Document Released: 08/14/2005 Document Revised: 01/20/2016 Document Reviewed: 01/27/2014 Elsevier Interactive Patient  Education  [employer].

## 2017-10-22 ENCOUNTER — Encounter: Payer: Self-pay | Admitting: Family Medicine

## 2017-10-22 ENCOUNTER — Ambulatory Visit (INDEPENDENT_AMBULATORY_CARE_PROVIDER_SITE_OTHER): Payer: PPO | Admitting: Family Medicine

## 2017-10-22 ENCOUNTER — Telehealth: Payer: Self-pay

## 2017-10-22 VITALS — BP 134/78 | HR 73 | Temp 98.1°F | Resp 16 | Ht 71.0 in | Wt 161.2 lb

## 2017-10-22 DIAGNOSIS — J449 Chronic obstructive pulmonary disease, unspecified: Secondary | ICD-10-CM | POA: Diagnosis not present

## 2017-10-22 DIAGNOSIS — G8929 Other chronic pain: Secondary | ICD-10-CM | POA: Diagnosis not present

## 2017-10-22 DIAGNOSIS — Z794 Long term (current) use of insulin: Secondary | ICD-10-CM | POA: Diagnosis not present

## 2017-10-22 DIAGNOSIS — I739 Peripheral vascular disease, unspecified: Secondary | ICD-10-CM | POA: Diagnosis not present

## 2017-10-22 DIAGNOSIS — E441 Mild protein-calorie malnutrition: Secondary | ICD-10-CM

## 2017-10-22 DIAGNOSIS — B182 Chronic viral hepatitis C: Secondary | ICD-10-CM

## 2017-10-22 DIAGNOSIS — R936 Abnormal findings on diagnostic imaging of limbs: Secondary | ICD-10-CM

## 2017-10-22 DIAGNOSIS — E1142 Type 2 diabetes mellitus with diabetic polyneuropathy: Secondary | ICD-10-CM | POA: Diagnosis not present

## 2017-10-22 DIAGNOSIS — E785 Hyperlipidemia, unspecified: Secondary | ICD-10-CM

## 2017-10-22 DIAGNOSIS — M25562 Pain in left knee: Secondary | ICD-10-CM

## 2017-10-22 DIAGNOSIS — F200 Paranoid schizophrenia: Secondary | ICD-10-CM

## 2017-10-22 LAB — POCT GLYCOSYLATED HEMOGLOBIN (HGB A1C): HEMOGLOBIN A1C: 6.3

## 2017-10-22 MED ORDER — PRAVASTATIN SODIUM 20 MG PO TABS: 20.0000 mg | ORAL_TABLET | Freq: Every day | ORAL | 1 refills | Status: DC

## 2017-10-22 NOTE — Telephone Encounter (Signed)
Copied from [place] [id number]. Topic: Inquiry >> Oct 22, 2017  2:24 PM Pricilla Handler wrote: Reason for CRM: Patient called wanting to receive advice from Dr. Ancil Boozer concerning a Fiber supplement. Patient wants a call back.       Thank You!!!

## 2017-10-22 NOTE — Telephone Encounter (Signed)
What exactly is his question? Can you please verify.

## 2017-10-22 NOTE — Progress Notes (Signed)
Name: ARIEON CORCORAN   MRN: [id number]    DOB: 1956/07/08   Date:10/22/2017       Progress Note  Subjective  Chief Complaint  Chief Complaint  Patient presents with  . Medication Refill    3 month F/U  . Diabetes    Does not check his sugar at home  . Hypertension    Denies any symptoms  . Hyperlipidemia    Muscle Cramps in legs-getting better with muscle relaxers but still occasionally  . Schizophrenia  . Constipation    Went down to 1 pill daily of Amitiza but was having diarrhea spells. Had colonscopy in January-had polyps    HPI  Lumbar radiculitis: he had back surgery in 06/2016 and 08/2016. He was doing well, off most medications,back onon GabapentinTID (He stopped to try Lyrica - stopped Lyrica because of insomnia) He also takes Robaxin and uses heating pad at time)s. He still has leg pain daily, and even with gabapentin pain has been worse lately, keeping him up at night, pain level is 8/10. He states pain is not going up to his back lately, mostly from diabetic neuropathy at this time.   DMII: he is still on Levemir 5 units daily , hgbA1C was 6.8% but went down to 6.0,  6.7%, 6.8%, now it is 6.3%He denies polydipsia, polyuria or polyphagia. He states he has some hypoglycemia before dinner - usually when he skips afternoon snack. He is on ace and a statin, and has neuropathy. Pain from neuropathy still intense, taking Gabapentin to tid.   Lower extremity edema and history of claudication: symptoms of leg swelling since the end of January 2017seen by vascular surgeon, per patient studies negative and swelling has been downsince February 2018 He still has some intermittent claudication but only wears when he has swelling.  COPD: he is not on medication, because he refuses, healways hasdaily productive cough in am's.Hehas some SOB when he starts walking but resolves within a few minutes - he takes deeps breaths to improve symptoms.[name] He is still smoking about 1 and  half pack of cigarettes daily but not ready to quit smoking yet   Hyperlipidemia: taking Pravastatin, only half pill at night.Last lipid pane 12/15/2016 and was at goal.No myalgias.  HTN: taking bp medication and denies side effects, bp is at goal, no chest pain or palpitation, vision changes.  Schizophrenia: denies any hallucinations, no depressed mood or suicidal thoughts or ideation, feeling well, not on medication and refuses to see Psychiatrist. No changes at this time.  Hepatitis C antibody positive: he has a remote history of drug use from age 28 till 57. Used injectable drug use at the time, he has positive hepatitis C b, he went to see GI but did not discuss hepatitis C   Chronic Constipation: he has intermittent periods of constipation. [place] was1-2, dry and hadto strain, He was on Amitiza but had episodes of explosive diarrhea, and was advised by GI to change to Miralax.   Left knee pain: seen by Ortho and also Rheumatologist, for now told to take Tylenol and also told he has osteoarthritis, he states pain on left knee is intermittent and usually triggered by activity, better with rest . Pain is described as aching   Patient Active Problem List   Diagnosis Date Noted  . Encounter for screening colonoscopy   . Polyp of sigmoid colon   . Diverticulosis of large intestine without diverticulitis   . Benign neoplasm of ascending colon   . Osteoarthritis  of both hands 09/04/2017  . Chronic pain of left knee 09/04/2017  . Abnormal MRI, knee 09/04/2017  . 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 Lymphedema 07/07/2016  . Hepatitis C 06/01/2016  . Lumbar herniated disc 11/24/2015  . Protein malnutrition ([place]) 11/11/2015  . Thrombocytopenia ([place]) 11/11/2015  . Hepatitis C antibody test positive 07/13/2015  . Chronic constipation 07/13/2015  . Abnormal immunological findings in specimens from other organs, systems and tissues 07/13/2015  . CN (constipation)  07/13/2015  . Benign hypertension 03/08/2015  . COPD, mild ([place]) 03/08/2015  . Dyslipidemia 03/08/2015  . Paranoid schizophrenia ([place]) 03/08/2015  . Acquired polyneuropathy 03/08/2015  . Restless leg 03/08/2015  . Tobacco abuse 03/08/2015  . Callus of foot 03/08/2015  . Claudication ([place]) 03/08/2015  . Chronic obstructive pulmonary disease ([place]) 03/08/2015  . Corn or callus 03/08/2015  . Essential (primary) hypertension 03/08/2015  . HLD (hyperlipidemia) 03/08/2015  . Peripheral vascular disease ([place]) 03/08/2015  . Polyneuropathy 03/08/2015  . Current tobacco use 03/08/2015  . Vitamin D deficiency 04/26/2009  . Avitaminosis D 04/26/2009    Past Surgical History:  Procedure Laterality Date  . COLONOSCOPY WITH PROPOFOL N/A 09/07/2017   Procedure: COLONOSCOPY WITH PROPOFOL;  Surgeon: Virgel Manifold, MD;  Location: [facility] ENDOSCOPY;  Service: Endoscopy;  Laterality: N/A;  . HERNIA REPAIR     inguinal  . L3 TO L5 LAMINECTOMY FOR DECOMPRESSION  07/17/2016   [facility]  . REPAIR OF CEREBROSPINAL FLUID LEAK N/A 09/08/2016   Procedure: Lumbar wound exploration, repair of pseudomenigocele;  Surgeon: Kevan Ny Ditty, MD;  Location: [facility];  Service: Neurosurgery;  Laterality: N/A;  Lumbar wound exploration, repair of pseudomenigocele  . SINUSOTOMY      Family History  Problem Relation Age of Onset  . Hypertension Mother   . Diabetes Father   . Hypertension Father   . Heart disease Father   . Diverticulitis Brother     Social History   Socioeconomic History  . Marital status: Single    Spouse name: Not on file  . Number of children: Not on file  . Years of education: Not on file  . Highest education level: Not on file  Social Needs  . Financial resource strain: Not on file  . Food insecurity - worry: Not on file  . Food insecurity - inability: Not on file  . Transportation needs - medical: Not on file  . Transportation needs - non-medical: Not on  file  Occupational History  . Not on file  Tobacco Use  . Smoking status: Current Every Day Smoker    Packs/day: 1.50    Years: 45.00    Pack years: 67.50    Types: Cigarettes  . Smokeless tobacco: Never Used  Substance and Sexual Activity  . Alcohol use: No    Alcohol/week: 0.0 oz  . Drug use: No  . Sexual activity: Not Currently  Other Topics Concern  . Not on file  Social History Narrative  . Not on file     Current Outpatient Medications:  .  gabapentin (NEURONTIN) 300 MG capsule, Take 1 capsule (300 mg total) by mouth 3 (three) times daily., Disp: 270 capsule, Rfl: 0 .  insulin detemir (LEVEMIR) 100 UNIT/ML injection, Inject 0.05 mLs (5 Units total) into the skin every evening., Disp: 10 mL, Rfl: 0 .  lisinopril (PRINIVIL,ZESTRIL) 20 MG tablet, Take 1 tablet (20 mg total) by mouth daily., Disp: 90 tablet, Rfl: 1 .  meloxicam (MOBIC) 15 MG tablet, Take 1  tablet (15 mg total) daily by mouth., Disp: 30 tablet, Rfl: 0 .  methocarbamol (ROBAXIN) 750 MG tablet, TAKE ONE TABLET BY MOUTH EVERY 8 HOURS AS NEEDED FOR MUSCLE SPASMS., Disp: 90 tablet, Rfl: 2 .  polyethylene glycol (MIRALAX) packet, Take 17 g by mouth daily., Disp: 14 each, Rfl: 0 .  pravastatin (PRAVACHOL) 40 MG tablet, Take 1 tablet (40 mg total) by mouth daily., Disp: 90 tablet, Rfl: 1 .  SUPREP BOWEL PREP KIT 17.5-3.13-1.6 GM/177ML SOLN, , Disp: , Rfl:  .  urea (CARMOL) 40 % CREA, Apply topically., Disp: , Rfl:  .  Vitamin D, Ergocalciferol, (DRISDOL) 50000 units CAPS capsule, TAKE ONE CAPSULE BY MOUTH ONCE A WEEK OR  BIWEEKLY  AS  DIRECTED, Disp: 4 capsule, Rfl: 6  Allergies  Allergen Reactions  . Aspirin Nausea Only and Other (See Comments)  . Penicillins Hives and Swelling    SWELLING REACTION UNSPECIFIED   Has patient had a PCN reaction causing immediate rash, facial/tongue/throat swelling, SOB or lightheadedness with hypotension: #  #  #  YES  #  #  #  Has patient had a PCN reaction causing severe rash  involving mucus membranes or skin necrosis:  #  #  #  UNKNOWN  #  #  #   Has patient had a PCN reaction that required hospitalization:  #  #  #  UNKNOWN  #  #  #  Has patient had a PCN reaction occurring within the last 10 years:  #  #  #  NO  #  #  #   . Lyrica [Pregabalin] Other (See Comments)    intolerance     ROS  Constitutional: Negative for fever or weight change.  Respiratory: Positive  for cough and shortness of breath.   Cardiovascular: Negative for chest pain or palpitations.  Gastrointestinal: Negative for abdominal pain, no bowel changes.  Musculoskeletal: Positive  for gait problem - has claudication and also left knee pain, but no joint swelling.  Skin: Negative for rash.  Neurological: Negative for dizziness or headache.  No other specific complaints in a complete review of systems (except as listed in HPI above).  Objective  Vitals:   10/22/17 1324  BP: 134/78  Pulse: 73  Resp: 16  Temp: 98.1 F (36.7 C)  TempSrc: Oral  SpO2: 97%  Weight: 161 lb 3.2 oz (73.1 kg)  Height: '5\' 11"'  (1.803 m)    Body mass index is 22.48 kg/m.  Physical Exam  Constitutional: Patient appears well-developed   No distress.  HEENT: head atraumatic, normocephalic, pupils equal and reactive to light, neck supple, throat within normal limits Cardiovascular: Normal rate, regular rhythm and normal heart sounds.  No murmur heard. No BLE edema, signs of PVD, erythematous lower legs, no hair Pulmonary/Chest: Effort normal and breath sounds normal. No respiratory distress. Abdominal: Soft.  There is no tenderness. Skin: skin is smooth, always increase in erythema, no hair on lower extremities , mild clubbing of fingers Psychiatric: Patient has a normal mood and affect. behavior is normal. Judgment and thought content normal.   Recent Results (from the past 2160 hour(s))  Glucose, capillary     Status: None   Collection Time: 09/07/17 12:45 PM  Result Value Ref Range    Glucose-Capillary 71 65 - 99 mg/dL  Surgical pathology     Status: None   Collection Time: 09/07/17  2:34 PM  Result Value Ref Range   SURGICAL PATHOLOGY      Surgical  Pathology CASE: [id number] PATIENT: Casson Gloeckner Surgical Pathology Report     SPECIMEN SUBMITTED: A. Colon polyp x3; cbx B. Colon polyp x1, sigmoid; hot snare  CLINICAL HISTORY: None provided  PRE-OPERATIVE DIAGNOSIS: Screening colonoscopy  POST-OPERATIVE DIAGNOSIS: Diverticulosis, colon polyps     DIAGNOSIS: A.  COLON POLYP 3; COLD BIOPSY: - TUBULAR ADENOMAS (2), NEGATIVE FOR HIGH-GRADE DYSPLASIA AND MALIGNANCY. - HYPERPLASTIC POLYP (1).  B.  COLON POLYP 1, SIGMOID; HOT SNARE: - TUBULOVILLOUS ADENOMA. - NEGATIVE FOR HIGH-GRADE DYSPLASIA AND MALIGNANCY.   GROSS DESCRIPTION:  A. Labeled: C BX colon polyp 3  Tissue fragment(s): 5  Size: 0.2-0.3 cm  Description: in formalin, tan fragments  Entirely submitted in 1 cassette(s).  B. Labeled: hot snare polyp 1 sigmoid colon  Tissue fragment(s): multiple  Size: aggregate, 2.2 x 1.1 x 0.5 cm  Description: in formalin, tan to brown polypoid fragment in yellow-tan fecal mater ial, 2 large polypoid fragments marked at possible base differentially and bisected  Entirely submitted in 1 cassette(s).        Final Diagnosis performed by Delorse Lek, MD.  Electronically signed 09/11/2017 12:31:06PM    The electronic signature indicates that the named Attending Pathologist has evaluated the specimen  Technical component performed at [facility], [address] [place], [place] [address] Lab: [phone] Dir: Rush Farmer, MD, MMM  Professional component performed at [facility], [facility], [place], [place], [place] [address] Lab: [phone] Dir: Dellia Nims. Rubinas, MD    POCT HgB A1C     Status: None   Collection Time: 10/22/17  1:35 PM  Result Value Ref Range   Hemoglobin A1C 6.3       PHQ2/9: Depression screen [facility] 2/9 10/22/2017 07/17/2017 03/30/2017 06/30/2016 03/21/2016  Decreased Interest 0 0 0 0 0  Down, Depressed, Hopeless 0 0 0 0 0  PHQ - 2 Score 0 0 0 0 0  Altered sleeping - - - - 0  Tired, decreased energy - - - - 0  Change in appetite - - - - 0  Feeling bad or failure about yourself  - - - - 0  Trouble concentrating - - - - 0  Moving slowly or fidgety/restless - - - - 0  Suicidal thoughts - - - - 0  PHQ-9 Score - - - - 0    Fall Risk: Fall Risk  10/22/2017 07/17/2017 03/30/2017 06/30/2016 12/21/2015  Falls in the past year? No No No No No     Functional Status Survey: Is the patient deaf or have difficulty hearing?: Yes Does the patient have difficulty seeing, even when wearing glasses/contacts?: Yes(Blurry vision) Does the patient have difficulty concentrating, remembering, or making decisions?: No Does the patient have difficulty walking or climbing stairs?: No Does the patient have difficulty dressing or bathing?: No Does the patient have difficulty doing errands alone such as visiting a doctor's office or shopping?: No    Assessment & Plan  1. Type 2 diabetes mellitus with diabetic polyneuropathy, with long-term current use of insulin (HCC)  - POCT HgB A1C at goal   2. Dyslipidemia  - pravastatin (PRAVACHOL) 20 MG tablet; Take 1 tablet (20 mg total) by mouth daily.  Dispense: 90 tablet; Refill: 1  3. Peripheral vascular disease ([place])  Seen by vascular in the past , advised to try 81 mg aspirin to try   4. Paranoid schizophrenia ([place])  Stable   5. Protein-calorie malnutrition, mild (HCC)   Resolved, last labs back to normal   6. Claudication (  [place])  stable  7. Abnormal MRI, knee  Pain is intermittent, OA  8. Chronic pain of left knee  Continue follow up with Ortho  9. COPD, mild ([place])  Discussed ways to substitute his habit.   10. Chronic hepatitis C without hepatic coma (HCC)  Advised to discuss it with GI

## 2017-10-22 NOTE — Patient Instructions (Signed)

## 2017-10-23 NOTE — Telephone Encounter (Signed)
Patient states after his colonoscopy Dr. Bonna Gains told him to ask his PCP about a fiber supplement. Patient has been on stool softeners for two years and still has trouble going to the bathroom regularly.

## 2017-10-24 NOTE — Telephone Encounter (Signed)
For fiber he can eat 6 cups of fruit and vegetables daily

## 2017-10-24 NOTE — Telephone Encounter (Signed)
Patient notified via voicemail.

## 2017-10-24 NOTE — Telephone Encounter (Signed)
Is he taking Miralax one cap full daily? He can add metamucil otc , or take miralax twice daily

## 2017-10-24 NOTE — Telephone Encounter (Signed)
Patient states he is taking 1 capful of Miralax daily and just needed help with his fiber intake. He will try the metamucil and see if that helps his symptoms of constipation along with the Miralax.

## 2017-11-08 ENCOUNTER — Encounter: Payer: Self-pay | Admitting: *Deleted

## 2017-11-09 ENCOUNTER — Other Ambulatory Visit: Payer: Self-pay

## 2017-11-09 ENCOUNTER — Ambulatory Visit
Admission: RE | Admit: 2017-11-09 | Discharge: 2017-11-09 | Disposition: A | Discharge status: Home / Self Care | Payer: PPO | Source: Ambulatory Visit | Attending: Gastroenterology | Admitting: Gastroenterology

## 2017-11-09 ENCOUNTER — Encounter
Admission: RE | Disposition: A | Discharge status: Home / Self Care | Payer: Self-pay | Source: Ambulatory Visit | Attending: Gastroenterology

## 2017-11-09 ENCOUNTER — Ambulatory Visit: Payer: PPO | Admitting: Anesthesiology

## 2017-11-09 DIAGNOSIS — K5730 Diverticulosis of large intestine without perforation or abscess without bleeding: Secondary | ICD-10-CM | POA: Diagnosis not present

## 2017-11-09 DIAGNOSIS — D125 Benign neoplasm of sigmoid colon: Secondary | ICD-10-CM | POA: Diagnosis not present

## 2017-11-09 DIAGNOSIS — E1151 Type 2 diabetes mellitus with diabetic peripheral angiopathy without gangrene: Secondary | ICD-10-CM | POA: Diagnosis not present

## 2017-11-09 DIAGNOSIS — Z860100 Personal history of colon polyps, unspecified: Secondary | ICD-10-CM

## 2017-11-09 DIAGNOSIS — J449 Chronic obstructive pulmonary disease, unspecified: Secondary | ICD-10-CM | POA: Insufficient documentation

## 2017-11-09 DIAGNOSIS — M1990 Unspecified osteoarthritis, unspecified site: Secondary | ICD-10-CM | POA: Diagnosis not present

## 2017-11-09 DIAGNOSIS — Z791 Long term (current) use of non-steroidal anti-inflammatories (NSAID): Secondary | ICD-10-CM | POA: Diagnosis not present

## 2017-11-09 DIAGNOSIS — E559 Vitamin D deficiency, unspecified: Secondary | ICD-10-CM | POA: Diagnosis not present

## 2017-11-09 DIAGNOSIS — G2581 Restless legs syndrome: Secondary | ICD-10-CM | POA: Diagnosis not present

## 2017-11-09 DIAGNOSIS — I10 Essential (primary) hypertension: Secondary | ICD-10-CM | POA: Diagnosis not present

## 2017-11-09 DIAGNOSIS — K5790 Diverticulosis of intestine, part unspecified, without perforation or abscess without bleeding: Secondary | ICD-10-CM | POA: Diagnosis not present

## 2017-11-09 DIAGNOSIS — E1142 Type 2 diabetes mellitus with diabetic polyneuropathy: Secondary | ICD-10-CM | POA: Diagnosis not present

## 2017-11-09 DIAGNOSIS — F17210 Nicotine dependence, cigarettes, uncomplicated: Secondary | ICD-10-CM | POA: Insufficient documentation

## 2017-11-09 DIAGNOSIS — Z886 Allergy status to analgesic agent status: Secondary | ICD-10-CM | POA: Diagnosis not present

## 2017-11-09 DIAGNOSIS — D126 Benign neoplasm of colon, unspecified: Secondary | ICD-10-CM | POA: Diagnosis not present

## 2017-11-09 DIAGNOSIS — Z79899 Other long term (current) drug therapy: Secondary | ICD-10-CM | POA: Diagnosis not present

## 2017-11-09 DIAGNOSIS — Z1211 Encounter for screening for malignant neoplasm of colon: Secondary | ICD-10-CM | POA: Insufficient documentation

## 2017-11-09 DIAGNOSIS — K635 Polyp of colon: Secondary | ICD-10-CM | POA: Diagnosis not present

## 2017-11-09 DIAGNOSIS — Z888 Allergy status to other drugs, medicaments and biological substances status: Secondary | ICD-10-CM | POA: Insufficient documentation

## 2017-11-09 DIAGNOSIS — E785 Hyperlipidemia, unspecified: Secondary | ICD-10-CM | POA: Insufficient documentation

## 2017-11-09 DIAGNOSIS — Z794 Long term (current) use of insulin: Secondary | ICD-10-CM | POA: Diagnosis not present

## 2017-11-09 HISTORY — PX: FLEXIBLE SIGMOIDOSCOPY: SHX5431

## 2017-11-09 HISTORY — DX: Gastro-esophageal reflux disease without esophagitis: K21.9

## 2017-11-09 LAB — GLUCOSE, CAPILLARY
GLUCOSE-CAPILLARY: 67 mg/dL (ref 65–99)
Glucose-Capillary: 116 mg/dL — ABNORMAL HIGH (ref 65–99)
Glucose-Capillary: 120 mg/dL — ABNORMAL HIGH (ref 65–99)

## 2017-11-09 LAB — HM SIGMOIDOSCOPY

## 2017-11-09 SURGERY — SIGMOIDOSCOPY, FLEXIBLE
Anesthesia: General

## 2017-11-09 SURGERY — ESOPHAGOGASTRODUODENOSCOPY (EGD) WITH PROPOFOL
Anesthesia: General

## 2017-11-09 MED ORDER — SODIUM CHLORIDE 0.9 % IV SOLN
INTRAVENOUS | Status: DC
  Administered 2017-11-09: 10:00:00 via INTRAVENOUS

## 2017-11-09 MED ORDER — PHENYLEPHRINE HCL 10 MG/ML IJ SOLN
INTRAMUSCULAR | Status: DC | PRN
  Administered 2017-11-09: 100 ug via INTRAVENOUS

## 2017-11-09 MED ORDER — DEXTROSE 50 % IV SOLN
25.0000 mL | INTRAVENOUS | Status: AC
  Administered 2017-11-09: 25 mL via INTRAVENOUS

## 2017-11-09 MED ORDER — PROPOFOL 10 MG/ML IV BOLUS
INTRAVENOUS | Status: DC | PRN
  Administered 2017-11-09: 60 mg via INTRAVENOUS

## 2017-11-09 MED ORDER — DEXTROSE 50 % IV SOLN
INTRAVENOUS | Status: AC
  Filled 2017-11-09: qty 50

## 2017-11-09 MED ORDER — LIDOCAINE HCL (CARDIAC) 20 MG/ML IV SOLN
INTRAVENOUS | Status: DC | PRN
  Administered 2017-11-09: 40 mg via INTRAVENOUS

## 2017-11-09 MED ORDER — PROPOFOL 500 MG/50ML IV EMUL
INTRAVENOUS | Status: DC | PRN
  Administered 2017-11-09: 140 ug/kg/min via INTRAVENOUS

## 2017-11-09 NOTE — Anesthesia Postprocedure Evaluation (Signed)
Anesthesia Post Note  Patient: Mark Nash  Procedure(s) Performed: FLEXIBLE SIGMOIDOSCOPY (N/A )  Patient location during evaluation: Endoscopy Anesthesia Type: General Level of consciousness: awake and alert Pain management: pain level controlled Vital Signs Assessment: post-procedure vital signs reviewed and stable Respiratory status: spontaneous breathing, nonlabored ventilation, respiratory function stable and patient connected to nasal cannula oxygen Cardiovascular status: blood pressure returned to baseline and stable Postop Assessment: no apparent nausea or vomiting Anesthetic complications: no     Last Vitals:  Vitals:   11/09/17 1250 11/09/17 1300  BP: 108/87 (!) 143/92  Pulse: 78 74  Resp: (!) 26 (!) 21  Temp:    SpO2: 98% 98%    Last Pain:  Vitals:   11/09/17 1300  TempSrc:   PainSc: 4                  Martha Clan

## 2017-11-09 NOTE — Anesthesia Post-op Follow-up Note (Signed)
Anesthesia QCDR form completed.        

## 2017-11-09 NOTE — H&P (Signed)
Mark Antigua, MD [address] [place], [place], [place], [id number] [id number], [place], [place], [place], [address] Phone: [phone]  Fax: [phone]  Primary Care Physician:  Mark Sizer, MD   Pre-Procedure History & Physical: HPI:  Mark Nash is a 62 y.o. male is here for a colonoscopy/flexible sigmoidoscopy.   Past Medical History:  Diagnosis Date  . Arthritis   . Asthma   . Constipation   . COPD (chronic obstructive pulmonary disease) ([place])   . Diabetes mellitus without complication (HCC)    Type 2  . GERD (gastroesophageal reflux disease)   . Hepatitis C   . History of kidney stones   . Hyperlipidemia   . Hypertension   . Restless legs   . Schizoaffective disorder ([place])    Pt denies  . Vitamin D deficiency     Past Surgical History:  Procedure Laterality Date  . BACK SURGERY    . COLONOSCOPY WITH PROPOFOL N/A 09/07/2017   Procedure: COLONOSCOPY WITH PROPOFOL;  Surgeon: Mark Manifold, MD;  Location: [facility] END[facility];  Service: Endoscopy;  Laterality: N/A;  . HERNIA REPAIR     inguinal  . L3 TO L5 LAMINECTOMY FOR DECOMPRESSION  07/17/2016   [facility]  . REPAIR OF CEREBROSPINAL FLUID LEAK N/A 09/08/2016   Procedure: Lumbar wound exploration, repair of pseudomenigocele;  Surgeon: Mark Ny Ditty, MD;  Location: [facility];  Service: Neurosurgery;  Laterality: N/A;  Lumbar wound exploration, repair of pseudomenigocele  . SINUSOTOMY      Prior to Admission medications   Medication Sig Start Date End Date Taking? Authorizing Provider  gabapentin (NEURONTIN) 300 MG capsule Take 1 capsule (300 mg total) by mouth 3 (three) times daily. 10/18/17 01/16/18 Yes Sowles, Drue Stager, MD  insulin detemir (LEVEMIR) 100 UNIT/ML injection Inject 0.05 mLs (5 Units total) into the skin every evening. 07/17/17  Yes Sowles, Drue Stager, MD  lisinopril (PRINIVIL,ZESTRIL) 20 MG tablet Take 1 tablet (20 mg total) by mouth daily. 07/17/17  Yes  Sowles, Drue Stager, MD  meloxicam (MOBIC) 15 MG tablet Take 1 tablet (15 mg total) daily by mouth. 07/04/17  Yes Triplett, Cari B, FNP  methocarbamol (ROBAXIN) 750 MG tablet TAKE ONE TABLET BY MOUTH EVERY 8 HOURS AS NEEDED FOR MUSCLE SPASMS. 10/18/17  Yes Sowles, Drue Stager, MD  urea (CARMOL) 40 % CREA Apply topically. 07/03/17 07/03/18 Yes [provider]  polyethylene glycol (MIRALAX) packet Take 17 g by mouth daily. 10/18/17   Mark Manifold, MD  pravastatin (PRAVACHOL) 20 MG tablet Take 1 tablet (20 mg total) by mouth daily. 10/22/17   Mark Sizer, MD  SUPREP BOWEL PREP KIT 17.5-3.13-1.6 GM/177ML SOLN  08/29/17   [provider]  Vitamin D, Ergocalciferol, (DRISDOL) 50000 units CAPS capsule TAKE ONE CAPSULE BY MOUTH ONCE A WEEK OR  BIWEEKLY  AS  DIRECTED 06/18/17   Mark Sizer, MD    Allergies as of 09/24/2017 - Review Complete 09/07/2017  Allergen Reaction Noted  . Aspirin Nausea Only and Other (See Comments) 03/08/2015  . Penicillins Hives and Swelling 03/08/2015  . Lyrica [pregabalin] Other (See Comments) 04/16/2017    Family History  Problem Relation Age of Onset  . Hypertension Mother   . Diabetes Father   . Hypertension Father   . Heart disease Father   . Diverticulitis Brother     Social History   Socioeconomic History  . Marital status: Single    Spouse name: Not on file  . Number of children: Not on file  .  Years of education: Not on file  . Highest education level: Not on file  Social Needs  . Financial resource strain: Not on file  . Food insecurity - worry: Not on file  . Food insecurity - inability: Not on file  . Transportation needs - medical: Not on file  . Transportation needs - non-medical: Not on file  Occupational History  . Not on file  Tobacco Use  . Smoking status: Current Every Day Smoker    Packs/day: 1.50    Years: 45.00    Pack years: 67.50    Types: Cigarettes  . Smokeless tobacco: Never Used  Substance and Sexual  Activity  . Alcohol use: No    Alcohol/week: 0.0 oz  . Drug use: No  . Sexual activity: Not Currently  Other Topics Concern  . Not on file  Social History Narrative  . Not on file    Review of Systems: See HPI, otherwise negative ROS  Physical Exam: BP 140/78   Pulse 83   Temp (!) 96.7 F (35.9 C)   Resp 18   Ht '5\' 11"'  (1.803 m)   Wt 161 lb (73 kg)   SpO2 99%   BMI 22.45 kg/m  General:   Alert,  pleasant and cooperative in NAD Head:  Normocephalic and atraumatic. Neck:  Supple; no masses or thyromegaly. Lungs:  Clear throughout to auscultation, normal respiratory effort.    Heart:  +S1, +S2, Regular rate and rhythm, No edema. Abdomen:  Soft, nontender and nondistended. Normal bowel sounds, without guarding, and without rebound.   Neurologic:  Alert and  oriented x4;  grossly normal neurologically.  Impression/Plan: [name] is here for a colonoscopy/flexible sigmoidoscopy to be performed for polyp surveillance.  Risks, benefits, limitations, and alternatives regarding  colonoscopy have been reviewed with the patient.  Questions have been answered.  All parties agreeable.   Mark Manifold, MD  11/09/2017, 11:41 AM

## 2017-11-09 NOTE — Anesthesia Preprocedure Evaluation (Signed)
Anesthesia Evaluation  Patient identified by MRN, date of birth, ID band Patient awake    Reviewed: Allergy & Precautions, NPO status , Patient's Chart, lab work & pertinent test results, reviewed documented beta blocker date and time   History of Anesthesia Complications Negative for: history of anesthetic complications  Airway Mallampati: I  TM Distance: >3 FB     Dental  (+) Chipped, Missing, Poor Dentition, Dental Advisory Given   Pulmonary neg shortness of breath, asthma , neg sleep apnea, COPD, neg recent URI, Current Smoker,           Cardiovascular Exercise Tolerance: Good hypertension, Pt. on medications (-) angina+ Peripheral Vascular Disease  (-) CAD, (-) Past MI, (-) Cardiac Stents and (-) CABG (-) dysrhythmias (-) Valvular Problems/Murmurs     Neuro/Psych neg Seizures PSYCHIATRIC DISORDERS Schizophrenia  Neuromuscular disease    GI/Hepatic GERD  ,(+) Hepatitis -, C  Endo/Other  diabetes, Type 2  Renal/GU negative Renal ROS     Musculoskeletal  (+) Arthritis ,   Abdominal   Peds  Hematology negative hematology ROS (+)   Anesthesia Other Findings Past Medical History: No date: Arthritis No date: Asthma No date: Constipation No date: COPD (chronic obstructive pulmonary disease) (HCC) No date: Diabetes mellitus without complication (HCC)     Comment:  Type 2 No date: GERD (gastroesophageal reflux disease) No date: Hepatitis C No date: History of kidney stones No date: Hyperlipidemia No date: Hypertension No date: Restless legs No date: Schizoaffective disorder ([place])     Comment:  Pt denies No date: Vitamin D deficiency   Reproductive/Obstetrics                             Anesthesia Physical  Anesthesia Plan  ASA: III  Anesthesia Plan: General   Post-op Pain Management:    Induction: Intravenous  PONV Risk Score and Plan: 1 and Propofol infusion  Airway  Management Planned: Nasal Cannula  Additional Equipment:   Intra-op Plan:   Post-operative Plan:   Informed Consent: I have reviewed the patients History and Physical, chart, labs and discussed the procedure including the risks, benefits and alternatives for the proposed anesthesia with the patient or authorized representative who has indicated his/her understanding and acceptance.     Plan Discussed with: CRNA  Anesthesia Plan Comments:         Anesthesia Quick Evaluation

## 2017-11-09 NOTE — Op Note (Signed)
[facility] Gastroenterology Patient Name: Mark Nash Procedure Date: 11/09/2017 11:46 AM MRN: [id number] Account #: [id number] Date of Birth: October 04, 1955 Admit Type: Outpatient Age: 62 Room: [facility] ENDO ROOM 3 Gender: Male Note Status: Finalized Procedure:            Flexible Sigmoidoscopy Indications:          High risk colon cancer surveillance: Personal history                        of colonic polyps, Evaluation of previous polypectomy                        site at 18 cm Providers:            Varnita B. Bonna Gains MD, MD Referring MD:         Bethena Roys. Sowles, MD (Referring MD) Medicines:            Monitored Anesthesia Care Complications:        No immediate complications. Procedure:            Pre-Anesthesia Assessment:                       - Prior to the procedure, a History and Physical was                        performed, and patient medications, allergies and                        sensitivities were reviewed. The patient's tolerance of                        previous anesthesia was reviewed.                       - The risks and benefits of the procedure and the                        sedation options and risks were discussed with the                        patient. All questions were answered and informed                        consent was obtained.                       - Patient identification and proposed procedure were                        verified prior to the procedure by the physician, the                        nurse, the anesthesiologist, the anesthetist and the                        technician. The procedure was verified in the                        pre-procedure area in the procedure room in the  endoscopy suite.                       - ASA Grade Assessment: III - A patient with severe                        systemic disease.                       - After reviewing the risks and benefits, the patient                was deemed in satisfactory condition to undergo the                        procedure.                       After obtaining informed consent, the scope was passed                        under direct vision. The Colonoscope was introduced                        through the anus and advanced to the the descending                        colon. The flexible sigmoidoscopy was accomplished with                        ease. The patient tolerated the procedure well. The                        quality of the bowel preparation was good. Findings:      The perianal and digital rectal examinations were normal.      Two sessile polyps were found in the sigmoid colon. The polyps were 3 to       4 mm in size. These polyps were removed with a cold snare. Resection and       retrieval were complete.      A 5 mm polyp was found at 40 cm proximal to the anus. The polyp was       sessile. The polyp was removed with a cold snare. Resection and       retrieval were complete.      Multiple small and large-mouthed diverticula were found in the sigmoid       colon.      There is no endoscopic evidence of mass or polyps at 18 cm Prox to Anus.      The retroflexed view of the distal rectum and anal verge was normal and       showed no anal or rectal abnormalities. Impression:           - Two 3 to 4 mm polyps in the sigmoid colon, removed                        with a cold snare. Resected and retrieved.                       - One 5 mm polyp at 40 cm proximal to the anus, removed  with a cold snare. Resected and retrieved.                       - Diverticulosis in the sigmoid colon. Recommendation:       - Await pathology results.                       - Discharge patient to home.                       - Resume previous diet.                       - Continue present medications.                       - Return to my office as previously scheduled.                       - Return  to primary care physician as previously                        scheduled.                       - The findings and recommendations were discussed with                        the patient.                       - The findings and recommendations were discussed with                        the patient's family. Procedure Code(s):    --- Professional ---                       [phone], Sigmoidoscopy, flexible; with removal of                        tumor(s), polyp(s), or other lesion(s) by snare                        technique Diagnosis Code(s):    --- Professional ---                       Z86.010, Personal history of colonic polyps                       D12.5, Benign neoplasm of sigmoid colon                       K57.30, Diverticulosis of large intestine without                        perforation or abscess without bleeding CPT copyright 2016 American Medical Association. All rights reserved. The codes documented in this report are preliminary and upon coder review may  be revised to meet current compliance requirements.  [name], MD Margretta Sidle B. Bonna Gains MD, MD 11/09/2017 12:42:13 PM This report has been signed electronically. Number of Addenda: 0 Note Initiated On: 11/09/2017 11:46 AM Total Procedure Duration: 0 hours 36 minutes 45 seconds  [facility]

## 2017-11-09 NOTE — Transfer of Care (Signed)
Immediate Anesthesia Transfer of Care Note  Patient: Mark Nash  Procedure(s) Performed: FLEXIBLE SIGMOIDOSCOPY (N/A )  Patient Location: PACU  Anesthesia Type:General  Level of Consciousness: awake, alert  and oriented  Airway & Oxygen Therapy: Patient Spontanous Breathing and Patient connected to nasal cannula oxygen  Post-op Assessment: Report given to RN and Post -op Vital signs reviewed and stable  Post vital signs: Reviewed and stable  Last Vitals:  Vitals:   11/09/17 0917 11/09/17 1240  BP: 140/78 95/60  Pulse: 83 73  Resp: 18 12  Temp: (!) 35.9 C (!) 36.1 C  SpO2: 99% 99%    Last Pain:  Vitals:   11/09/17 0917  PainSc: 4          Complications: No apparent anesthesia complications

## 2017-11-12 ENCOUNTER — Encounter: Payer: Self-pay | Admitting: Gastroenterology

## 2017-11-12 LAB — SURGICAL PATHOLOGY

## 2017-11-16 ENCOUNTER — Encounter: Payer: Self-pay | Admitting: Gastroenterology

## 2017-11-21 ENCOUNTER — Other Ambulatory Visit: Payer: Self-pay

## 2017-11-21 DIAGNOSIS — R1013 Epigastric pain: Secondary | ICD-10-CM

## 2017-11-26 ENCOUNTER — Telehealth: Payer: Self-pay | Admitting: Family Medicine

## 2017-11-26 NOTE — Telephone Encounter (Signed)
Called patient in response to your message. Patient states that you prescribed this medication to him about 1 year or more ago. He had stopped it at one point and started taking Miralax and he increased his fiber intake. He started the Amitiza back about 1 week or more, because he was having uncontrollable bowel movements. He has tired taking the medication prn and then he started taking it once daily up until yesterday. He stopped taking it and he has not had diarrhea or any uncontrollable bowel movements today.

## 2017-11-26 NOTE — Telephone Encounter (Signed)
Copied from [place] [id number]. Topic: Quick Communication - See Telephone Encounter >> Nov 26, 2017 10:06 AM Bea Graff, NT wrote: CRM for notification. See Telephone encounter for: 11/26/17. Pt would like to speak with Dr. Ancil Boozer regarding the [place]. He states it his giving him really bad loose stools and he would like to see what she would like him to do.

## 2017-11-26 NOTE — Telephone Encounter (Signed)
Amitiza is not on his list. Did he get from GI? He can take it prn. Maybe once a day

## 2017-11-27 ENCOUNTER — Ambulatory Visit: Payer: PPO

## 2017-11-30 ENCOUNTER — Ambulatory Visit (INDEPENDENT_AMBULATORY_CARE_PROVIDER_SITE_OTHER): Payer: PPO

## 2017-11-30 VITALS — BP 142/70 | HR 62 | Temp 98.2°F | Resp 14 | Ht 71.0 in | Wt 163.9 lb

## 2017-11-30 DIAGNOSIS — Z794 Long term (current) use of insulin: Secondary | ICD-10-CM

## 2017-11-30 DIAGNOSIS — E119 Type 2 diabetes mellitus without complications: Secondary | ICD-10-CM | POA: Diagnosis not present

## 2017-11-30 DIAGNOSIS — Z0000 Encounter for general adult medical examination without abnormal findings: Secondary | ICD-10-CM

## 2017-11-30 NOTE — Patient Instructions (Addendum)
Mark Nash , Thank you for taking time to come for your Medicare Wellness Visit. I appreciate your ongoing commitment to your health goals. Please review the following plan we discussed and let me know if I can assist you in the future.   Screening recommendations/referrals: Colorectal Screening: Completed colonoscopy 09/07/17 and flex sig 11/09/17. Repeat every year Lung Cancer Screening: You will receive a call from Burgess Estelle, RN to schedule your CT Hepatitis C Screening: Completed 10/22/17  Vision and Dental Exams: Recommended annual ophthalmology exams for early detection of glaucoma and other disorders of the eye Recommended annual dental exams for proper oral hygiene  Diabetic Exams: Recommended annual diabetic eye exams for early detection of retinopathy Recommended annual diabetic foot exams for early detection of peripheral neuropathy.  Diabetic Eye Exam: Please schedule an appointment with your ophthalmologist for completion Diabetic Foot Exam: Please schedule an appointment with your podiatrist for completion  Vaccinations: Influenza vaccine: Up to date Pneumococcal vaccine: Completed series Tdap vaccine: Up to date Shingles vaccine: No yet required    Advanced directives: Advance directive discussed with you today. I have provided a copy for you to complete at home and have notarized. Once this is complete please bring a copy in to our office so we can scan it into your chart.  Conditions/risks identified: Recommend to decrease portion sizes by eating 3 small healthy meals and at least 2 healthy snacks per day.  Next appointment: Please schedule your Annual Wellness Visit with your Nurse Health Advisor in one year.  Preventive Care 40-64 Years, Male Preventive care refers to lifestyle choices and visits with your health care provider that can promote health and wellness. What does preventive care include?  A yearly physical exam. This is also called an annual well  check.  Dental exams once or twice a year.  Routine eye exams. Ask your health care provider how often you should have your eyes checked.  Personal lifestyle choices, including:  Daily care of your teeth and gums.  Regular physical activity.  Eating a healthy diet.  Avoiding tobacco and drug use.  Limiting alcohol use.  Practicing safe sex.  Taking low-dose aspirin every day starting at age 53. What happens during an annual well check? The services and screenings done by your health care provider during your annual well check will depend on your age, overall health, lifestyle risk factors, and family history of disease. Counseling  Your health care provider may ask you questions about your:  Alcohol use.  Tobacco use.  Drug use.  Emotional well-being.  Home and relationship well-being.  Sexual activity.  Eating habits.  Work and work Statistician. Screening  You may have the following tests or measurements:  Height, weight, and BMI.  Blood pressure.  Lipid and cholesterol levels. These may be checked every 5 years, or more frequently if you are over 17 years old.  Skin check.  Lung cancer screening. You may have this screening every year starting at age 53 if you have a 30-pack-year history of smoking and currently smoke or have quit within the past 15 years.  Fecal occult blood test (FOBT) of the stool. You may have this test every year starting at age 70.  Flexible sigmoidoscopy or colonoscopy. You may have a sigmoidoscopy every 5 years or a colonoscopy every 10 years starting at age 17.  Prostate cancer screening. Recommendations will vary depending on your family history and other risks.  Hepatitis C blood test.  Hepatitis B blood test.  Sexually transmitted disease (STD) testing.  Diabetes screening. This is done by checking your blood sugar (glucose) after you have not eaten for a while (fasting). You may have this done every 1-3 years. Discuss  your test results, treatment options, and if necessary, the need for more tests with your health care provider. Vaccines  Your health care provider may recommend certain vaccines, such as:  Influenza vaccine. This is recommended every year.  Tetanus, diphtheria, and acellular pertussis (Tdap, Td) vaccine. You may need a Td booster every 10 years.  Zoster vaccine. You may need this after age 29.  Pneumococcal 13-valent conjugate (PCV13) vaccine. You may need this if you have certain conditions and have not been vaccinated.  Pneumococcal polysaccharide (PPSV23) vaccine. You may need one or two doses if you smoke cigarettes or if you have certain conditions. Talk to your health care provider about which screenings and vaccines you need and how often you need them. This information is not intended to replace advice given to you by your health care provider. Make sure you discuss any questions you have with your health care provider. Document Released: 09/10/2015 Document Revised: 05/03/2016 Document Reviewed: 06/15/2015 Elsevier Interactive Patient Education  2017 [place] Prevention in the Home Falls can cause injuries. They can happen to people of all ages. There are many things you can do to make your home safe and to help prevent falls. What can I do on the outside of my home?  Regularly fix the edges of walkways and driveways and fix any cracks.  Remove anything that might make you trip as you walk through a door, such as a raised step or threshold.  Trim any bushes or trees on the path to your home.  Use bright outdoor lighting.  Clear any walking paths of anything that might make someone trip, such as rocks or tools.  Regularly check to see if handrails are loose or broken. Make sure that both sides of any steps have handrails.  Any raised decks and porches should have guardrails on the edges.  Have any leaves, snow, or ice cleared regularly.  Use sand or salt on  walking paths during winter.  Clean up any spills in your garage right away. This includes oil or grease spills. What can I do in the bathroom?  Use night lights.  Install grab bars by the toilet and in the tub and shower. Do not use towel bars as grab bars.  Use non-skid mats or decals in the tub or shower.  If you need to sit down in the shower, use a plastic, non-slip stool.  Keep the floor dry. Clean up any water that spills on the floor as soon as it happens.  Remove soap buildup in the tub or shower regularly.  Attach bath mats securely with double-sided non-slip rug tape.  Do not have throw rugs and other things on the floor that can make you trip. What can I do in the bedroom?  Use night lights.  Make sure that you have a light by your bed that is easy to reach.  Do not use any sheets or blankets that are too big for your bed. They should not hang down onto the floor.  Have a firm chair that has side arms. You can use this for support while you get dressed.  Do not have throw rugs and other things on the floor that can make you trip. What can I do in the kitchen?  Clean  up any spills right away.  Avoid walking on wet floors.  Keep items that you use a lot in easy-to-reach places.  If you need to reach something above you, use a strong step stool that has a grab bar.  Keep electrical cords out of the way.  Do not use floor polish or wax that makes floors slippery. If you must use wax, use non-skid floor wax.  Do not have throw rugs and other things on the floor that can make you trip. What can I do with my stairs?  Do not leave any items on the stairs.  Make sure that there are handrails on both sides of the stairs and use them. Fix handrails that are broken or loose. Make sure that handrails are as long as the stairways.  Check any carpeting to make sure that it is firmly attached to the stairs. Fix any carpet that is loose or worn.  Avoid having throw  rugs at the top or bottom of the stairs. If you do have throw rugs, attach them to the floor with carpet tape.  Make sure that you have a light switch at the top of the stairs and the bottom of the stairs. If you do not have them, ask someone to add them for you. What else can I do to help prevent falls?  Wear shoes that:  Do not have high heels.  Have rubber bottoms.  Are comfortable and fit you well.  Are closed at the toe. Do not wear sandals.  If you use a stepladder:  Make sure that it is fully opened. Do not climb a closed stepladder.  Make sure that both sides of the stepladder are locked into place.  Ask someone to hold it for you, if possible.  Clearly mark and make sure that you can see:  Any grab bars or handrails.  First and last steps.  Where the edge of each step is.  Use tools that help you move around (mobility aids) if they are needed. These include:  Canes.  Walkers.  Scooters.  Crutches.  Turn on the lights when you go into a dark area. Replace any light bulbs as soon as they burn out.  Set up your furniture so you have a clear path. Avoid moving your furniture around.  If any of your floors are uneven, fix them.  If there are any pets around you, be aware of where they are.  Review your medicines with your doctor. Some medicines can make you feel dizzy. This can increase your chance of falling. Ask your doctor what other things that you can do to help prevent falls. This information is not intended to replace advice given to you by your health care provider. Make sure you discuss any questions you have with your health care provider. Document Released: 06/10/2009 Document Revised: 01/20/2016 Document Reviewed: 09/18/2014 Elsevier Interactive Patient Education  2017 [employer]  Smoking Tobacco Information Smoking tobacco will very likely harm your health. Tobacco contains a poisonous (toxic), colorless chemical called nicotine. Nicotine  affects the brain and makes tobacco addictive. This change in your brain can make it hard to stop smoking. Tobacco also has other toxic chemicals that can hurt your body and raise your risk of many cancers. How can smoking tobacco affect me? Smoking tobacco can increase your chances of having serious health conditions, such as:  Cancer. Smoking is most commonly associated with lung cancer, but can lead to cancer in other parts of the body.  Chronic obstructive pulmonary disease (COPD). This is a long-term lung condition that makes it hard to breathe. It also gets worse over time.  High blood pressure (hypertension), heart disease, stroke, or heart attack.  Lung infections, such as pneumonia.  Cataracts. This is when the lenses in the eyes become clouded.  Digestive problems. This may include peptic ulcers, heartburn, and gastroesophageal reflux disease (GERD).  Oral health problems, such as gum disease and tooth loss.  Loss of taste and smell.  Smoking can affect your appearance by causing:  Wrinkles.  Yellow or stained teeth, fingers, and fingernails.  Smoking tobacco can also affect your social life.  Many workplaces, [employer], hotels, and public places are tobacco-free. This means that you may experience challenges in finding places to smoke when away from home.  The cost of a smoking habit can be expensive. Expenses for someone who smokes come in two ways: ? You spend money on a regular basis to buy tobacco. ? Your health care costs in the long-term are higher if you smoke.  Tobacco smoke can also affect the health of those around you. Children of smokers have greater chances of: ? Sudden infant death syndrome (SIDS). ? Ear infections. ? Lung infections.  What lifestyle changes can be made?  Do not start smoking. Quit if you already do.  To quit smoking: ? Make a plan to quit smoking and commit yourself to it. Look for programs to help you and ask your health care  provider for recommendations and ideas. ? Talk with your health care provider about using nicotine replacement medicines to help you quit. Medicine replacement medicines include gum, lozenges, patches, sprays, or pills. ? Do not replace cigarette smoking with electronic cigarettes, which are commonly called e-cigarettes. The safety of e-cigarettes is not known, and some may contain harmful chemicals. ? Avoid places, people, or situations that tempt you to smoke. ? If you try to quit but return to smoking, don't give up hope. It is very common for people to try a number of times before they fully succeed. When you feel ready again, give it another try.  Quitting smoking might affect the way you eat as well as your weight. Be prepared to monitor your eating habits. Get support in planning and following a healthy diet.  Ask your health care provider about having regular tests (screenings) to check for cancer. This may include blood tests, imaging tests, and other tests.  Exercise regularly. Consider taking walks, joining a gym, or doing yoga or exercise classes.  Develop skills to manage your stress. These skills include meditation. What are the benefits of quitting smoking? By quitting smoking, you may:  Lower your risk of getting cancer and other diseases caused by smoking.  Live longer.  Breathe better.  Lower your blood pressure and heart rate.  Stop your addiction to tobacco.  Stop creating secondhand smoke that hurts other people.  Improve your sense of taste and smell.  Look better over time, due to having fewer wrinkles and less staining.  What can happen if changes are not made? If you do not stop smoking, you may:  Get cancer and other diseases.  Develop COPD or other long-term (chronic) lung conditions.  Develop serious problems with your heart and blood vessels (cardiovascular system).  Need more tests to screen for problems caused by smoking.  Have higher,  long-term healthcare costs from medicines or treatments related to smoking.  Continue to have worsening changes in your lungs, mouth, and nose.  Where to find support: To get support to quit smoking, consider:  Asking your health care provider for more information and resources.  Taking classes to learn more about quitting smoking.  Looking for local organizations that offer resources about quitting smoking.  Joining a support group for people who want to quit smoking in your local community.  Where to find more information: You may find more information about quitting smoking from:  HelpGuide.org: www.helpguide.org/articles/addictions/how-to-quit-smoking.htm  https://hall.com/: smokefree.gov  American Lung Association: www.lung.org  Contact a health care provider if:  You have problems breathing.  Your lips, nose, or fingers turn blue.  You have chest pain.  You are coughing up blood.  You feel faint or you pass out.  You have other noticeable changes that cause you to worry. Summary  Smoking tobacco can negatively affect your health, the health of those around you, your finances, and your social life.  Do not start smoking. Quit if you already do. If you need help quitting, ask your health care provider.  Think about joining a support group for people who want to quit smoking in your local community. There are many effective programs that will help you to quit this behavior. This information is not intended to replace advice given to you by your health care provider. Make sure you discuss any questions you have with your health care provider. Document Released: 08/29/2016 Document Revised: 08/29/2016 Document Reviewed: 08/29/2016 Elsevier Interactive Patient Education  [employer].

## 2017-11-30 NOTE — Progress Notes (Addendum)
Subjective:   Mark Nash is a 62 y.o. male who presents for an Initial Medicare Annual Wellness Visit.  Review of Systems  N/A Cardiac Risk Factors include: smoking/ tobacco exposure;sedentary lifestyle;advanced age (>5mn, >>71women);diabetes mellitus;dyslipidemia;hypertension;male gender    Objective:    Today's Vitals   11/30/17 1458  BP: (!) 142/70  Pulse: 62  Resp: 14  Temp: 98.2 F (36.8 C)  TempSrc: Oral  SpO2: 97%  Weight: 163 lb 14.4 oz (74.3 kg)  Height: 5' 11" (1.803 m)   Body mass index is 22.86 kg/m.  Advanced Directives 11/30/2017 11/09/2017 09/07/2017 07/04/2017 04/16/2017 08/21/2016 08/15/2016  Does Patient Have a Medical Advance Directive? _0  No No  Would patient like information on creating a medical advance directive? Yes (MAU/Ambulatory/Procedural Areas - Information given) Yes (MAU/Ambulatory/Procedural Areas - Information given) No - Patient declined - - - -    Current Medications (verified) Outpatient Encounter Medications as of 11/30/2017  Medication Sig  . gabapentin (NEURONTIN) 300 MG capsule Take 1 capsule (300 mg total) by mouth 3 (three) times daily.  . insulin detemir (LEVEMIR) 100 UNIT/ML injection Inject 0.05 mLs (5 Units total) into the skin every evening.  .Marland Kitchenlisinopril (PRINIVIL,ZESTRIL) 20 MG tablet Take 1 tablet (20 mg total) by mouth daily.  .Marland Kitchenlubiprostone (AMITIZA) 8 MCG capsule Take 8 mcg by mouth as needed for constipation.  . meloxicam (MOBIC) 15 MG tablet Take 1 tablet (15 mg total) daily by mouth.  . methocarbamol (ROBAXIN) 750 MG tablet TAKE ONE TABLET BY MOUTH EVERY 8 HOURS AS NEEDED FOR MUSCLE SPASMS.  .Marland Kitchenpravastatin (PRAVACHOL) 20 MG tablet Take 1 tablet (20 mg total) by mouth daily.  . urea (CARMOL) 40 % CREA Apply topically.  . Vitamin D, Ergocalciferol, (DRISDOL) 50000 units CAPS capsule TAKE ONE CAPSULE BY MOUTH ONCE A WEEK OR  BIWEEKLY  AS  DIRECTED  . [DISCONTINUED] polyethylene glycol (MIRALAX) packet Take 17  g by mouth daily.  . [DISCONTINUED] SUPREP BOWEL PREP KIT 17.5-3.13-1.6 GM/177ML SOLN    No facility-administered encounter medications on file as of 11/30/2017.     Allergies (verified) Aspirin; Penicillins; and Lyrica [pregabalin]   Hospitalizations/ED visits and surgeries occurring within the previous 12 months:  [place] [place] [place]. Pre-cancerous polyps removed and [place]  [facility] [facility] [place] [facility] Is scheduled for EGD on 12/07/17.  In [facility] the above listed procedures, [facility] in L knee, treated by Dr. SCherylann Banas Not seen by PCP in follow up.  History: Past Medical History:  Diagnosis Date  . Arthritis   . Asthma   . Constipation   . COPD [place]ronic obstructive pulmonary disease) ([place]   . Diabetes mellitus without complication (HCC)    Type 2  . GERD (gastroesophageal reflux disease)   . Hepatitis C   . History of kidney stones   . Hyperlipidemia   . Hypertension   . Restless legs   . [place]ive disorder [place]    Pt denies  . Vitamin D deficiency    Past Surgical History:  Procedure Laterality Date  . BACK SURGERY    . COLONOSCOPY WITH PROPOFOL N/A 09/07/2017   Procedure: COLONOSCOPY WITH PROPOFOL;  Surgeon: TVirgel Manifold MD;  Location: [facility] ENDOSCOPY;  Service: Endoscopy;  Laterality: N/A;  . FLEXIBLE SIGMOIDOSCOPY N/A 11/09/2017   Procedure: FLEXIBLE SIGMOIDOSCOPY;  Surgeon: TBonna Gains  Lennette Bihari, MD;  Location: [facility] ENDOSCOPY;  Service: Endoscopy;  Laterality: N/A;  . HERNIA REPAIR     inguinal  . L3 TO L5 LAMINECTOMY FOR DECOMPRESSION  07/17/2016   [facility]  . REPAIR OF CEREBROSPINAL FLUID LEAK N/A 09/08/2016   Procedure: Lumbar wound exploration, repair of pseudomenigocele;  Surgeon: Kevan Ny Ditty, MD;  Location: [facility];  Service: Neurosurgery;  Laterality: N/A;  Lumbar wound exploration,  repair of pseudomenigocele  . SINUSOTOMY     Family History  Problem Relation Age of Onset  . Hypertension Mother   . Diverticulitis Mother   . Colitis Mother   . Diabetes Father   . Hypertension Father   . Heart disease Father   . Diverticulitis Brother   . Diabetes Sister    Social History   Socioeconomic History  . Marital status: Divorced    Spouse name: Not on file  . Number of children: 2  . Years of education: Not on file  . Highest education level: 12th grade  Occupational History  . Occupation: Disabled  Social Needs  . Financial resource strain: Not hard at all  . Food insecurity:    Worry: Never true    Inability: Never true  . Transportation needs:    Medical: No    Non-medical: No  Tobacco Use  . Smoking status: Current Every Day Smoker    Packs/day: 2.00    Years: 46.00    Pack years: 92.00    Types: Cigarettes  . Smokeless tobacco: Never Used  Substance and Sexual Activity  . Alcohol use: No    Alcohol/week: 0.0 oz  . Drug use: No  . Sexual activity: Not Currently  Lifestyle  . Physical activity:    Days per week: 1 day    Minutes per session: 20 min  . Stress: Not at all  Relationships  . Social connections:    Talks on phone: Patient refused    Gets together: Patient refused    Attends religious service: Patient refused    Active member of club or organization: Patient refused    Attends meetings of clubs or organizations: Patient refused    Relationship status: Divorced  Other Topics Concern  . Not on file  Social History Narrative  . Not on file   Tobacco Counseling Ready to quit: No Counseling given: Yes   Clinical Intake:  Pre-visit preparation completed: Yes  Pain : No/denies pain   BMI - recorded: 22.86 Nutritional Status: BMI of 19-24  Normal Nutrition Risk Assessment: Has the patient had any N/V/D within the last 2 months?  No Does the patient have any non-healing wounds?  No Has the patient had any unintentional  weight loss or weight gain?  No  Is the patient diabetic?  Yes If diabetic, was a CBG obtained today?  No Did the patient bring in their glucometer from home?  No Comments: Pt does not currently monitor CBG's at home. Denies financial strains with device or supplies. Education provided re: importance of monitoring CBG's. Verbalized acceptance and understanding.  Diabetic Exams: Diabetic Eye Exam: Overdue for diabetic eye exam. Pt states he is not established with a physician to complete this exam. Pt has been advised about the importance in completing this exam. A referral has been placed to [facility]. Message sent to referral coordinator for scheduling purposes. Pt is aware he will receive a call from our office re: his appt.  Diabetic Foot Exam: Will be overdue for  diabetic foot exam after 12/15/17. Last performed on 12/15/16. Pt has been advised about the importance in completing this exam in a timely manner. Advised to schedule an appt with Dr. Elvina Mattes to complete this exam. Once completed, reminded pt to provide the office with a copy of his/her diabetic foot exam. Verbalized acceptance and understanding.  How often do you need to have someone help you when you read instructions, pamphlets, or other written materials from your doctor or pharmacy?: 1 - Never  Interpreter Needed?: No  Information entered by :: [place], LPN  Activities of Daily Living In your present state of health, do you have any difficulty performing the following activities: 11/30/2017 10/22/2017  Hearing? N Y  Comment denies hearing aids -  Vision? N Y  Comment reading glasses Blurry vision  Difficulty concentrating or making decisions? Y N  Walking or climbing stairs? Y N  Comment back pain and joint pain -  Dressing or bathing? N N  Doing errands, shopping? N N  Preparing Food and eating ? N -  Comment denies dentures -  Using the Toilet? N -  In the past six months, have you accidently leaked urine?  N -  Do you have problems with loss of bowel control? N -  Managing your Medications? N -  Managing your Finances? N -  Housekeeping or managing your Housekeeping? N -  Some recent data might be hidden     Immunizations and Health Maintenance Immunization History  Administered Date(s) Administered  . Influenza, High Dose Seasonal PF 06/30/2016  . Influenza,inj,Quad PF,6+ Mos 07/17/2017  . Pneumococcal Conjugate-13 07/13/2015  . Pneumococcal Polysaccharide-23 12/23/2009  . Tdap 12/23/2009   Health Maintenance Due  Topic Date Due  . OPHTHALMOLOGY EXAM  03/11/1966    Patient Care Team: [name], MD as PCP - General (Family Medicine) Elvina Mattes, Rodman Key, DPM as Consulting Physician (Podiatry) Virgel Manifold, MD as Consulting Physician (Gastroenterology) Marlowe Sax, MD as Consulting Physician (Internal Medicine)  Indicate any recent Medical Services you may have received from other than [facility] providers in the past year (date may be approximate).    Assessment:   This is a routine wellness examination for Cable.  Hearing/Vision screen Vision Screening Comments: Not established with a provider. Referred to [facility]. Message sent to referral coordinator for scheduling purposes  Dietary issues and exercise activities discussed: Current Exercise Habits: The patient does not participate in regular exercise at present, Exercise limited by: None identified  Goals    . DIET - Reduce sweets and carbohydrate intake     Recommend to decrease portion sizes by eating 3 small healthy meals and at least 2 healthy snacks per day.      Depression Screen PHQ 2/9 Scores 11/30/2017 10/22/2017 07/17/2017 03/30/2017  PHQ - 2 Score 0 0 0 0  PHQ- 9 Score 0 - - -    Fall Risk Fall Risk  11/30/2017 10/22/2017 07/17/2017 03/30/2017 06/30/2016  Falls in the past year? _0   Risk for fall due to : Impaired vision - - - -    Is the home free of loose throw rugs  in walkways, pet beds, electrical cords, etc? Yes Adequate lighting to reduce risk of falls?  Yes In addition, does the patient have any of the following: Stairs in or around the home WITH handrails? Yes Grab bars in the bathroom? No  Shower chair or a place to sit while bathing? No Use of an elevated toilet seat  or a handicapped toilet? No Use of a cane, walker or w/c? No  Timed Get Up and Go Performed: Yes. Pt ambulated 10 feet within 12 sec. Gait slow, steady and without the use of an assistive device. No intervention required at this time. Fall risk prevention has been discussed.  Community Resource Referral not required at this time. Pt lives in an apartment.  Cognitive Function:     6CIT Screen 11/30/2017  What Year? 0 points  What month? 0 points  What time? 0 points  Count back from 20 0 points  Months in reverse 0 points  Repeat phrase 0 points  Total Score 0    Screening Tests Health Maintenance  Topic Date Due  . OPHTHALMOLOGY EXAM  03/11/1966  . HIV Screening  07/09/2019 (Originally 03/11/1971)  . FOOT EXAM  12/15/2017  . INFLUENZA VACCINE  03/28/2018  . HEMOGLOBIN A1C  04/21/2018  . COLONOSCOPY  09/07/2018  . TETANUS/TDAP  12/24/2019  . PNEUMOCOCCAL POLYSACCHARIDE VACCINE  Completed  . Hepatitis C Screening  Completed    Qualifies for Shingles Vaccine? No  Cancer Screenings: Lung: Low Dose CT Chest recommended if Age 50-80 years, 30 pack-year currently smoking OR have quit w/in 15years. Patient does qualify. An Epic message has been sent to Burgess Estelle, RN (Oncology Nurse Navigator) regarding the possible need for this exam. Raquel Sarna will review the patient's chart to determine if the patient truly qualifies for the exam. If the patient qualifies, Raquel Sarna will order the Low Dose CT of the chest to facilitate the scheduling of this exam. Colorectal: Completed colonoscopy 09/07/17 and flex sig 11/09/17. Repeat every year.  Additional Screenings: Hepatitis C  Screening: Completed 10/22/17    Plan:  I have personally reviewed and addressed the Medicare Annual Wellness questionnaire and have noted the following in the patient's chart:  A. Medical and social history B. Use of alcohol, tobacco or illicit drugs  C. Current medications and supplements D. Functional ability and status E.  Nutritional status F.  Physical activity G. Advance directives H. List of other physicians I.  Hospitalizations, surgeries, and ER visits in previous 12 months J.  [place] such as hearing and vision if needed, cognitive and depression L. Referrals and appointments  In addition, I have reviewed and discussed with patient certain preventive protocols, quality metrics, and best practice recommendations. A written personalized care plan for preventive services as well as general preventive health recommendations were provided to patient.  See attached scanned questionnaire for additional information.   Signed,  Aleatha Borer, LPN Nurse Health Advisor  I have reviewed this encounter including the documentation in this note and/or discussed this patient with the provider, Aleatha Borer, LPN. I am certifying that I agree with the content of this note as supervising physician.  [name], MD [facility] 11/30/2017, 3:47 PM

## 2017-12-03 ENCOUNTER — Telehealth: Payer: Self-pay | Admitting: *Deleted

## 2017-12-03 DIAGNOSIS — Z87891 Personal history of nicotine dependence: Secondary | ICD-10-CM

## 2017-12-03 DIAGNOSIS — Z122 Encounter for screening for malignant neoplasm of respiratory organs: Secondary | ICD-10-CM

## 2017-12-03 NOTE — Telephone Encounter (Signed)
Received referral for initial lung cancer screening scan. Contacted patient and obtained smoking history,(current, 92 pack year) as well as answering questions related to screening process. Patient denies signs of lung cancer such as weight loss or hemoptysis. Patient denies comorbidity that would prevent curative treatment if lung cancer were found. Patient is scheduled for shared decision making visit and CT scan on 12/18/17.

## 2017-12-04 ENCOUNTER — Ambulatory Visit: Payer: PPO

## 2017-12-07 ENCOUNTER — Other Ambulatory Visit: Payer: Self-pay

## 2017-12-07 ENCOUNTER — Ambulatory Visit: Payer: PPO | Admitting: Anesthesiology

## 2017-12-07 ENCOUNTER — Encounter
Admission: RE | Disposition: A | Discharge status: Home / Self Care | Payer: Self-pay | Source: Ambulatory Visit | Attending: Gastroenterology

## 2017-12-07 ENCOUNTER — Ambulatory Visit
Admission: RE | Admit: 2017-12-07 | Discharge: 2017-12-07 | Disposition: A | Discharge status: Home / Self Care | Payer: PPO | Source: Ambulatory Visit | Attending: Gastroenterology | Admitting: Gastroenterology

## 2017-12-07 ENCOUNTER — Encounter: Payer: Self-pay | Admitting: *Deleted

## 2017-12-07 DIAGNOSIS — F17210 Nicotine dependence, cigarettes, uncomplicated: Secondary | ICD-10-CM | POA: Diagnosis not present

## 2017-12-07 DIAGNOSIS — K2950 Unspecified chronic gastritis without bleeding: Secondary | ICD-10-CM | POA: Diagnosis not present

## 2017-12-07 DIAGNOSIS — E785 Hyperlipidemia, unspecified: Secondary | ICD-10-CM | POA: Insufficient documentation

## 2017-12-07 DIAGNOSIS — I10 Essential (primary) hypertension: Secondary | ICD-10-CM | POA: Insufficient documentation

## 2017-12-07 DIAGNOSIS — Z888 Allergy status to other drugs, medicaments and biological substances status: Secondary | ICD-10-CM | POA: Insufficient documentation

## 2017-12-07 DIAGNOSIS — Z794 Long term (current) use of insulin: Secondary | ICD-10-CM | POA: Insufficient documentation

## 2017-12-07 DIAGNOSIS — G2581 Restless legs syndrome: Secondary | ICD-10-CM | POA: Diagnosis not present

## 2017-12-07 DIAGNOSIS — E559 Vitamin D deficiency, unspecified: Secondary | ICD-10-CM | POA: Insufficient documentation

## 2017-12-07 DIAGNOSIS — Z87442 Personal history of urinary calculi: Secondary | ICD-10-CM | POA: Diagnosis not present

## 2017-12-07 DIAGNOSIS — F259 Schizoaffective disorder, unspecified: Secondary | ICD-10-CM | POA: Insufficient documentation

## 2017-12-07 DIAGNOSIS — Z8249 Family history of ischemic heart disease and other diseases of the circulatory system: Secondary | ICD-10-CM | POA: Insufficient documentation

## 2017-12-07 DIAGNOSIS — Z880 Allergy status to penicillin: Secondary | ICD-10-CM | POA: Insufficient documentation

## 2017-12-07 DIAGNOSIS — Z79899 Other long term (current) drug therapy: Secondary | ICD-10-CM | POA: Insufficient documentation

## 2017-12-07 DIAGNOSIS — E1151 Type 2 diabetes mellitus with diabetic peripheral angiopathy without gangrene: Secondary | ICD-10-CM | POA: Insufficient documentation

## 2017-12-07 DIAGNOSIS — K3189 Other diseases of stomach and duodenum: Secondary | ICD-10-CM | POA: Diagnosis not present

## 2017-12-07 DIAGNOSIS — K228 Other specified diseases of esophagus: Secondary | ICD-10-CM | POA: Diagnosis not present

## 2017-12-07 DIAGNOSIS — K2960 Other gastritis without bleeding: Secondary | ICD-10-CM | POA: Diagnosis not present

## 2017-12-07 DIAGNOSIS — J449 Chronic obstructive pulmonary disease, unspecified: Secondary | ICD-10-CM | POA: Diagnosis not present

## 2017-12-07 DIAGNOSIS — Z886 Allergy status to analgesic agent status: Secondary | ICD-10-CM | POA: Insufficient documentation

## 2017-12-07 DIAGNOSIS — R1013 Epigastric pain: Secondary | ICD-10-CM | POA: Diagnosis not present

## 2017-12-07 DIAGNOSIS — K269 Duodenal ulcer, unspecified as acute or chronic, without hemorrhage or perforation: Secondary | ICD-10-CM | POA: Diagnosis not present

## 2017-12-07 DIAGNOSIS — B1920 Unspecified viral hepatitis C without hepatic coma: Secondary | ICD-10-CM | POA: Diagnosis not present

## 2017-12-07 DIAGNOSIS — K219 Gastro-esophageal reflux disease without esophagitis: Secondary | ICD-10-CM | POA: Insufficient documentation

## 2017-12-07 DIAGNOSIS — R1012 Left upper quadrant pain: Secondary | ICD-10-CM | POA: Diagnosis not present

## 2017-12-07 DIAGNOSIS — K2289 Other specified disease of esophagus: Secondary | ICD-10-CM

## 2017-12-07 DIAGNOSIS — M1990 Unspecified osteoarthritis, unspecified site: Secondary | ICD-10-CM | POA: Insufficient documentation

## 2017-12-07 HISTORY — PX: ESOPHAGOGASTRODUODENOSCOPY (EGD) WITH PROPOFOL: SHX5813

## 2017-12-07 LAB — GLUCOSE, CAPILLARY: GLUCOSE-CAPILLARY: 84 mg/dL (ref 65–99)

## 2017-12-07 SURGERY — ESOPHAGOGASTRODUODENOSCOPY (EGD) WITH PROPOFOL
Anesthesia: General

## 2017-12-07 MED ORDER — SODIUM CHLORIDE 0.9 % IV SOLN
INTRAVENOUS | Status: DC
  Administered 2017-12-07 (×2): via INTRAVENOUS

## 2017-12-07 MED ORDER — IPRATROPIUM-ALBUTEROL 0.5-2.5 (3) MG/3ML IN SOLN
3.0000 mL | Freq: Once | RESPIRATORY_TRACT | Status: AC
  Administered 2017-12-07: 3 mL via RESPIRATORY_TRACT

## 2017-12-07 MED ORDER — PROPOFOL 10 MG/ML IV BOLUS
INTRAVENOUS | Status: DC | PRN
  Administered 2017-12-07: 50 mg via INTRAVENOUS

## 2017-12-07 MED ORDER — OMEPRAZOLE 20 MG PO CPDR: 20.0000 mg | DELAYED_RELEASE_CAPSULE | Freq: Every day | ORAL | 1 refills | Status: DC

## 2017-12-07 MED ORDER — PROPOFOL 500 MG/50ML IV EMUL
INTRAVENOUS | Status: DC | PRN
  Administered 2017-12-07: 125 ug/kg/min via INTRAVENOUS

## 2017-12-07 MED ORDER — IPRATROPIUM-ALBUTEROL 0.5-2.5 (3) MG/3ML IN SOLN
RESPIRATORY_TRACT | Status: AC
Start: 2017-12-07 — End: 2017-12-07
  Administered 2017-12-07: 3 mL via RESPIRATORY_TRACT
  Filled 2017-12-07: qty 3

## 2017-12-07 MED ORDER — PROPOFOL 500 MG/50ML IV EMUL
INTRAVENOUS | Status: AC
  Filled 2017-12-07: qty 50

## 2017-12-07 NOTE — Anesthesia Preprocedure Evaluation (Signed)
Anesthesia Evaluation  Patient identified by MRN, date of birth, ID band Patient awake    Reviewed: Allergy & Precautions, H&P , NPO status , Patient's Chart, lab work & pertinent test results  History of Anesthesia Complications Negative for: history of anesthetic complications  Airway Mallampati: II  TM Distance: >3 FB Neck ROM: limited    Dental  (+) Chipped, Poor Dentition, Missing   Pulmonary asthma , COPD, Current Smoker,           Cardiovascular Exercise Tolerance: Good hypertension, (-) angina+ Peripheral Vascular Disease  (-) Past MI      Neuro/Psych PSYCHIATRIC DISORDERS Schizophrenia  Neuromuscular disease negative psych ROS   GI/Hepatic GERD  ,(+) Hepatitis -  Endo/Other  diabetes  Renal/GU negative Renal ROS  negative genitourinary   Musculoskeletal  (+) Arthritis ,   Abdominal   Peds  Hematology negative hematology ROS (+)   Anesthesia Other Findings Past Medical History: No date: Arthritis No date: Asthma No date: Constipation No date: COPD (chronic obstructive pulmonary disease) (HCC) No date: Diabetes mellitus without complication (HCC)     Comment:  Type 2 No date: GERD (gastroesophageal reflux disease) No date: Hepatitis C No date: History of kidney stones No date: Hyperlipidemia No date: Hypertension No date: Restless legs No date: Schizoaffective disorder ([place])     Comment:  Pt denies No date: Vitamin D deficiency  Past Surgical History: No date: BACK SURGERY 09/07/2017: COLONOSCOPY WITH PROPOFOL; N/A     Comment:  Procedure: COLONOSCOPY WITH PROPOFOL;  Surgeon:               Virgel Manifold, MD;  Location: [facility] ENDOSCOPY;                Service: Endoscopy;  Laterality: N/A; 11/09/2017: FLEXIBLE SIGMOIDOSCOPY; N/A     Comment:  Procedure: FLEXIBLE SIGMOIDOSCOPY;  Surgeon: Virgel Manifold, MD;  Location: [facility] ENDOSCOPY;  Service:               Endoscopy;   Laterality: N/A; No date: HERNIA REPAIR     Comment:  inguinal 07/17/2016: L3 TO L5 LAMINECTOMY FOR DECOMPRESSION     Comment:  [facility] 09/08/2016: REPAIR OF CEREBROSPINAL FLUID LEAK; N/A     Comment:  Procedure: Lumbar wound exploration, repair of               pseudomenigocele;  Surgeon: Kevan Ny Ditty, MD;                Location: [facility];  Service: Neurosurgery;  Laterality:               N/A;  Lumbar wound exploration, repair of               pseudomenigocele No date: SINUSOTOMY  BMI    Body Mass Index:  22.59 kg/m      Reproductive/Obstetrics negative OB ROS                             Anesthesia Physical Anesthesia Plan  ASA: III  Anesthesia Plan: General   Post-op Pain Management:    Induction: Intravenous  PONV Risk Score and Plan: Propofol infusion and TIVA  Airway Management Planned: Natural Airway and Nasal Cannula  Additional Equipment:   Intra-op Plan:   Post-operative Plan:   Informed Consent: I have reviewed the patients  History and Physical, chart, labs and discussed the procedure including the risks, benefits and alternatives for the proposed anesthesia with the patient or authorized representative who has indicated his/her understanding and acceptance.   Dental Advisory Given  Plan Discussed with: Anesthesiologist, CRNA and Surgeon  Anesthesia Plan Comments: (Patient consented for risks of anesthesia including but not limited to:  - adverse reactions to medications - risk of intubation if required - damage to teeth, lips or other oral mucosa - sore throat or hoarseness - Damage to heart, brain, lungs or loss of life  Patient voiced understanding.)        Anesthesia Quick Evaluation

## 2017-12-07 NOTE — Anesthesia Post-op Follow-up Note (Signed)
Anesthesia QCDR form completed.        

## 2017-12-07 NOTE — Transfer of Care (Signed)
Immediate Anesthesia Transfer of Care Note  Patient: Mark Nash  Procedure(s) Performed: ESOPHAGOGASTRODUODENOSCOPY (EGD) WITH PROPOFOL (N/A )  Patient Location: PACU and Endoscopy Unit  Anesthesia Type:General  Level of Consciousness: awake, alert  and oriented  Airway & Oxygen Therapy: Patient Spontanous Breathing and Patient connected to nasal cannula oxygen  Post-op Assessment: Report given to RN and Post -op Vital signs reviewed and stable  Post vital signs: Reviewed and stable  Last Vitals:  Vitals Value Taken Time  BP    Temp    Pulse    Resp    SpO2      Last Pain:  Vitals:   12/07/17 0833  TempSrc: Tympanic  PainSc: 5          Complications: No apparent anesthesia complications

## 2017-12-07 NOTE — Op Note (Addendum)
[facility] Gastroenterology Patient Name: Mark Nash Procedure Date: 12/07/2017 8:36 AM MRN: [id number] Account #: [id number] Date of Birth: [birth year] Admit Type: Outpatient Age: 62 Room: [facility] ENDO ROOM 2 Gender: Male Note Status: Finalized Procedure:            Upper GI endoscopy Indications:          Epigastric abdominal pain, Abdominal pain in the left                        upper quadrant Providers:            Airrion Otting B. Bonna Gains MD, MD Referring MD:         Bethena Roys. Sowles, MD (Referring MD) Medicines:            Monitored Anesthesia Care Complications:        No immediate complications. Procedure:            Pre-Anesthesia Assessment:                       - Prior to the procedure, a History and Physical was                        performed, and patient medications, allergies and                        sensitivities were reviewed. The patient's tolerance of                        previous anesthesia was reviewed.                       - The risks and benefits of the procedure and the                        sedation options and risks were discussed with the                        patient. All questions were answered and informed                        consent was obtained.                       - Patient identification and proposed procedure were                        verified prior to the procedure by the physician, the                        nurse, the anesthesiologist, the anesthetist and the                        technician. The procedure was verified in the procedure                        room.                       - ASA Grade Assessment: III - A patient with severe  systemic disease.                       After obtaining informed consent, the endoscope was                        passed under direct vision. Throughout the procedure,                        the patient's blood pressure, pulse, and oxygen                         saturations were monitored continuously. The Endoscope                        was introduced through the mouth, and advanced to the                        second part of duodenum. The upper GI endoscopy was                        accomplished with ease. The patient tolerated the                        procedure well. Findings:      The Z-line was irregular and was found 45 cm from the incisors.      One tongue of salmon-colored mucosa was present from 44 to 45 cm. No       other visible abnormalities were present. The maximum longitudinal       extent of these esophageal mucosal changes was 1 cm in length. Biopsies       were taken with a cold forceps for histology.      Patchy mildly erythematous mucosa without bleeding was found in the       gastric antrum. Biopsies were taken with a cold forceps for histology.       Biopsies were obtained in the gastric body, at the incisura and in the       gastric antrum with cold forceps for histology.      One non-bleeding superficial duodenal ulcer with a clean ulcer base       (Forrest Class III) was found in the duodenal bulb. The lesion was 2 mm       in largest dimension.      The second portion of the duodenum was normal. Impression:           - Z-line irregular, 45 cm from the incisors.                       - Salmon-colored mucosa suspicious for short-segment                        Barrett's esophagus. Biopsied.                       - Erythematous mucosa in the antrum. Biopsied.                       - One non-bleeding duodenal ulcer with a clean ulcer                        base (  Forrest Class III).                       - Normal second portion of the duodenum.                       - Biopsies were obtained in the gastric body, at the                        incisura and in the gastric antrum. Recommendation:       - Await pathology results.                       - Stop NSAID use (like Ibuprofen, Aleeve, Motrin,                         Advil, Meloxicam, Goodie Powder, BC powder etc.) except                        for Aspirin if medically indicated by PCP or cardiology                       - Take prescribed proton pump inhibitor or H2 blocker                        (antacid) medications 30 - 60 minutes before meals.                       - Follow an antireflux regimen.                       - Discharge patient to home (with escort).                       - Advance diet as tolerated.                       - Continue present medications.                       - Patient has a contact number available for                        emergencies. The signs and symptoms of potential                        delayed complications were discussed with the patient.                        Return to normal activities tomorrow. Written discharge                        instructions were provided to the patient.                       - Discharge patient to home (with escort).                       - The findings and recommendations were discussed with  the patient.                       - The findings and recommendations were discussed with                        the patient's family. Procedure Code(s):    --- Professional ---                       [phone], Esophagogastroduodenoscopy, flexible, transoral;                        with biopsy, single or multiple Diagnosis Code(s):    --- Professional ---                       K22.8, Other specified diseases of esophagus                       K31.89, Other diseases of stomach and duodenum                       K26.9, Duodenal ulcer, unspecified as acute or chronic,                        without hemorrhage or perforation                       R10.13, Epigastric pain                       R10.12, Left upper quadrant pain CPT copyright 2017 American Medical Association. All rights reserved. The codes documented in this report are preliminary and upon coder review may  be  revised to meet current compliance requirements.  [name], MD Margretta Sidle B. Bonna Gains MD, MD 12/07/2017 9:27:48 AM This report has been signed electronically. Number of Addenda: 0 Note Initiated On: 12/07/2017 8:36 AM Estimated Blood Loss: Estimated blood loss: none.      [facility]

## 2017-12-07 NOTE — Anesthesia Postprocedure Evaluation (Signed)
Anesthesia Post Note  Patient: Mark Nash  Procedure(s) Performed: ESOPHAGOGASTRODUODENOSCOPY (EGD) WITH PROPOFOL (N/A )  Patient location during evaluation: Endoscopy Anesthesia Type: General Level of consciousness: awake and alert Pain management: pain level controlled Vital Signs Assessment: post-procedure vital signs reviewed and stable Respiratory status: spontaneous breathing, nonlabored ventilation, respiratory function stable and patient connected to nasal cannula oxygen Cardiovascular status: blood pressure returned to baseline and stable Postop Assessment: no apparent nausea or vomiting Anesthetic complications: no     Last Vitals:  Vitals:   12/07/17 0930 12/07/17 0950  BP: (!) 159/88 (!) 175/90  Pulse: 82 82  Resp: 16 16  Temp:    SpO2: 100% 100%    Last Pain:  Vitals:   12/07/17 0920  TempSrc: Tympanic  PainSc:                  Precious Haws Alamin Mccuiston

## 2017-12-07 NOTE — H&P (Signed)
Mark Antigua, MD [address], [place], [place], [place], [address] [address], [place], [place], [place], [address] Phone: [phone]  Fax: [phone]  Primary Care Physician:  [name], MD   Pre-Procedure History & Physical: HPI:  Mark Nash is a 62 y.o. male is here for an EGD.   Past Medical History:  Diagnosis Date  . Arthritis   . Asthma   . Constipation   . COPD (chronic obstructive pulmonary disease) ([place])   . Diabetes mellitus without complication (HCC)    Type 2  . GERD (gastroesophageal reflux disease)   . Hepatitis C   . History of kidney stones   . Hyperlipidemia   . Hypertension   . Restless legs   . Schizoaffective disorder ([place])    Pt denies  . Vitamin D deficiency     Past Surgical History:  Procedure Laterality Date  . BACK SURGERY    . COLONOSCOPY WITH PROPOFOL N/A 09/07/2017   Procedure: COLONOSCOPY WITH PROPOFOL;  Surgeon: Virgel Manifold, MD;  Location: [facility] ENDOSCOPY;  Service: Endoscopy;  Laterality: N/A;  . FLEXIBLE SIGMOIDOSCOPY N/A 11/09/2017   Procedure: FLEXIBLE SIGMOIDOSCOPY;  Surgeon: Virgel Manifold, MD;  Location: [facility] ENDOSCOPY;  Service: Endoscopy;  Laterality: N/A;  . HERNIA REPAIR     inguinal  . L3 TO L5 LAMINECTOMY FOR DECOMPRESSION  07/17/2016   [facility]  . REPAIR OF CEREBROSPINAL FLUID LEAK N/A 09/08/2016   Procedure: Lumbar wound exploration, repair of pseudomenigocele;  Surgeon: Kevan Ny Ditty, MD;  Location: [facility];  Service: Neurosurgery;  Laterality: N/A;  Lumbar wound exploration, repair of pseudomenigocele  . SINUSOTOMY      Prior to Admission medications   Medication Sig Start Date End Date Taking? Authorizing Provider  gabapentin (NEURONTIN) 300 MG capsule Take 1 capsule (300 mg total) by mouth 3 (three) times daily. 10/18/17 01/16/18 Yes Sowles, Drue Stager, MD  insulin detemir (LEVEMIR) 100 UNIT/ML injection Inject 0.05 mLs (5 Units total) into the skin every  evening. 07/17/17  Yes Sowles, Drue Stager, MD  lisinopril (PRINIVIL,ZESTRIL) 20 MG tablet Take 1 tablet (20 mg total) by mouth daily. 07/17/17  Yes Sowles, Drue Stager, MD  lubiprostone (AMITIZA) 8 MCG capsule Take 8 mcg by mouth as needed for constipation.   Yes [provider]  meloxicam (MOBIC) 15 MG tablet Take 1 tablet (15 mg total) daily by mouth. 07/04/17  Yes Triplett, Cari B, FNP  methocarbamol (ROBAXIN) 750 MG tablet TAKE ONE TABLET BY MOUTH EVERY 8 HOURS AS NEEDED FOR MUSCLE SPASMS. 10/18/17  Yes Sowles, Drue Stager, MD  pravastatin (PRAVACHOL) 20 MG tablet Take 1 tablet (20 mg total) by mouth daily. 10/22/17  Yes Sowles, Drue Stager, MD  Vitamin D, Ergocalciferol, (DRISDOL) 50000 units CAPS capsule TAKE ONE CAPSULE BY MOUTH ONCE A WEEK OR  BIWEEKLY  AS  DIRECTED 06/18/17  Yes Sowles, Drue Stager, MD  urea (CARMOL) 40 % CREA Apply topically. 07/03/17 07/03/18  [provider]    Allergies as of 11/21/2017 - Review Complete 11/09/2017  Allergen Reaction Noted  . Aspirin Nausea Only and Other (See Comments) 03/08/2015  . Penicillins Hives and Swelling 03/08/2015  . Lyrica [pregabalin] Other (See Comments) 04/16/2017    Family History  Problem Relation Age of Onset  . Hypertension Mother   . Diverticulitis Mother   . Colitis Mother   . Diabetes Father   . Hypertension Father   . Heart disease Father   . Diverticulitis Brother   . Diabetes Sister  Social History   Socioeconomic History  . Marital status: Divorced    Spouse name: Not on file  . Number of children: 2  . Years of education: Not on file  . Highest education level: 12th grade  Occupational History  . Occupation: Disabled  Social Needs  . Financial resource strain: Not hard at all  . Food insecurity:    Worry: Never true    Inability: Never true  . Transportation needs:    Medical: No    Non-medical: No  Tobacco Use  . Smoking status: Current Every Day Smoker    Packs/day: 2.00    Years: 46.00    Pack  years: 92.00    Types: Cigarettes  . Smokeless tobacco: Never Used  Substance and Sexual Activity  . Alcohol use: No    Alcohol/week: 0.0 oz  . Drug use: No  . Sexual activity: Not Currently  Lifestyle  . Physical activity:    Days per week: 1 day    Minutes per session: 20 min  . Stress: Not at all  Relationships  . Social connections:    Talks on phone: Patient refused    Gets together: Patient refused    Attends religious service: Patient refused    Active member of club or organization: Patient refused    Attends meetings of clubs or organizations: Patient refused    Relationship status: Divorced  . Intimate partner violence:    Fear of current or ex partner: No    Emotionally abused: No    Physically abused: No    Forced sexual activity: No  Other Topics Concern  . Not on file  Social History Narrative  . Not on file    Review of Systems: See HPI, otherwise negative ROS  Physical Exam: BP (!) 191/85   Pulse 63   Temp (!) 97 F (36.1 C) (Tympanic)   Resp 16   Ht 5\' 11"  (1.803 m)   Wt 162 lb (73.5 kg)   SpO2 100%   BMI 22.59 kg/m  General:   Alert,  pleasant and cooperative in NAD Head:  Normocephalic and atraumatic. Neck:  Supple; no masses or thyromegaly. Lungs:  Clear throughout to auscultation, normal respiratory effort.    Heart:  +S1, +S2, Regular rate and rhythm, No edema. Abdomen:  Soft, nontender and nondistended. Normal bowel sounds, without guarding, and without rebound.   Neurologic:  Alert and  oriented x4;  grossly normal neurologically.  Impression/Plan: Mark Nash is here for an EGD for Abdominal Pain Risks, benefits, limitations, and alternatives regarding the procedure have been reviewed with the patient.  Questions have been answered.  All parties agreeable.   Virgel Manifold, MD  12/07/2017, 8:56 AM

## 2017-12-11 LAB — SURGICAL PATHOLOGY

## 2017-12-18 ENCOUNTER — Ambulatory Visit: Payer: PPO

## 2017-12-18 ENCOUNTER — Inpatient Hospital Stay: Payer: PPO | Admitting: Oncology

## 2017-12-27 ENCOUNTER — Other Ambulatory Visit: Payer: Self-pay | Admitting: Family Medicine

## 2017-12-27 DIAGNOSIS — E1140 Type 2 diabetes mellitus with diabetic neuropathy, unspecified: Secondary | ICD-10-CM

## 2018-01-13 ENCOUNTER — Other Ambulatory Visit: Payer: Self-pay | Admitting: Family Medicine

## 2018-01-13 DIAGNOSIS — M5440 Lumbago with sciatica, unspecified side: Principal | ICD-10-CM

## 2018-01-13 DIAGNOSIS — G8929 Other chronic pain: Secondary | ICD-10-CM

## 2018-01-22 ENCOUNTER — Ambulatory Visit: Payer: PPO | Admitting: Gastroenterology

## 2018-01-29 ENCOUNTER — Ambulatory Visit: Payer: PPO | Admitting: Gastroenterology

## 2018-02-11 DIAGNOSIS — L851 Acquired keratosis [keratoderma] palmaris et plantaris: Secondary | ICD-10-CM | POA: Diagnosis not present

## 2018-02-11 DIAGNOSIS — E1142 Type 2 diabetes mellitus with diabetic polyneuropathy: Secondary | ICD-10-CM | POA: Diagnosis not present

## 2018-02-11 DIAGNOSIS — B351 Tinea unguium: Secondary | ICD-10-CM | POA: Diagnosis not present

## 2018-02-11 LAB — HM DIABETES FOOT EXAM: HM DIABETIC FOOT EXAM: NORMAL

## 2018-02-19 ENCOUNTER — Ambulatory Visit (INDEPENDENT_AMBULATORY_CARE_PROVIDER_SITE_OTHER): Payer: PPO | Admitting: Family Medicine

## 2018-02-19 ENCOUNTER — Encounter: Payer: Self-pay | Admitting: Family Medicine

## 2018-02-19 VITALS — BP 160/100 | HR 78 | Temp 98.2°F | Resp 16 | Ht 71.0 in | Wt 164.2 lb

## 2018-02-19 DIAGNOSIS — J449 Chronic obstructive pulmonary disease, unspecified: Secondary | ICD-10-CM

## 2018-02-19 DIAGNOSIS — E118 Type 2 diabetes mellitus with unspecified complications: Secondary | ICD-10-CM | POA: Diagnosis not present

## 2018-02-19 DIAGNOSIS — G629 Polyneuropathy, unspecified: Secondary | ICD-10-CM | POA: Diagnosis not present

## 2018-02-19 DIAGNOSIS — F200 Paranoid schizophrenia: Secondary | ICD-10-CM | POA: Diagnosis not present

## 2018-02-19 DIAGNOSIS — I10 Essential (primary) hypertension: Secondary | ICD-10-CM | POA: Diagnosis not present

## 2018-02-19 DIAGNOSIS — Z794 Long term (current) use of insulin: Secondary | ICD-10-CM | POA: Diagnosis not present

## 2018-02-19 DIAGNOSIS — I739 Peripheral vascular disease, unspecified: Secondary | ICD-10-CM

## 2018-02-19 DIAGNOSIS — E559 Vitamin D deficiency, unspecified: Secondary | ICD-10-CM | POA: Diagnosis not present

## 2018-02-19 DIAGNOSIS — G8929 Other chronic pain: Secondary | ICD-10-CM

## 2018-02-19 DIAGNOSIS — E1142 Type 2 diabetes mellitus with diabetic polyneuropathy: Secondary | ICD-10-CM

## 2018-02-19 DIAGNOSIS — M5440 Lumbago with sciatica, unspecified side: Secondary | ICD-10-CM

## 2018-02-19 DIAGNOSIS — E785 Hyperlipidemia, unspecified: Secondary | ICD-10-CM

## 2018-02-19 DIAGNOSIS — D692 Other nonthrombocytopenic purpura: Secondary | ICD-10-CM

## 2018-02-19 DIAGNOSIS — M5441 Lumbago with sciatica, right side: Secondary | ICD-10-CM

## 2018-02-19 LAB — COMPLETE METABOLIC PANEL WITH GFR
AG RATIO: 2.4 (calc) (ref 1.0–2.5)
ALT: 14 U/L (ref 9–46)
AST: 14 U/L (ref 10–35)
Albumin: 4.6 g/dL (ref 3.6–5.1)
Alkaline phosphatase (APISO): 74 U/L (ref 40–115)
BILIRUBIN TOTAL: 1 mg/dL (ref 0.2–1.2)
BUN: 16 mg/dL (ref 7–25)
CALCIUM: 9.4 mg/dL (ref 8.6–10.3)
CHLORIDE: 103 mmol/L (ref 98–110)
CO2: 31 mmol/L (ref 20–32)
Creat: 0.71 mg/dL (ref 0.70–1.25)
GFR, EST AFRICAN AMERICAN: 117 mL/min/{1.73_m2} (ref >=60)
GFR, EST NON AFRICAN AMERICAN: 101 mL/min/{1.73_m2} (ref >=60)
GLOBULIN: 1.9 g/dL (ref 1.9–3.7)
Glucose, Bld: 116 mg/dL (ref 65–139)
POTASSIUM: 4.4 mmol/L (ref 3.5–5.3)
SODIUM: 140 mmol/L (ref 135–146)
TOTAL PROTEIN: 6.5 g/dL (ref 6.1–8.1)

## 2018-02-19 LAB — LIPID PANEL
Cholesterol: 128 mg/dL (ref <200)
HDL: 55 mg/dL (ref >40)
LDL Cholesterol (Calc): 58 mg/dL (calc)
Non-HDL Cholesterol (Calc): 73 mg/dL (calc) (ref <130)
Total CHOL/HDL Ratio: 2.3 (calc) (ref <5.0)
Triglycerides: 72 mg/dL (ref <150)

## 2018-02-19 LAB — POCT GLYCOSYLATED HEMOGLOBIN (HGB A1C): Hemoglobin A1C: 6.9 % — AB (ref 4.0–5.6)

## 2018-02-19 LAB — CBC WITH DIFFERENTIAL/PLATELET
BASOS ABS: 34 {cells}/uL (ref 0–200)
Basophils Relative: 0.5 %
EOS ABS: 134 {cells}/uL (ref 15–500)
EOS PCT: 2 %
HCT: 44 % (ref 38.5–50.0)
Hemoglobin: 15.7 g/dL (ref 13.2–17.1)
Lymphs Abs: 2137 cells/uL (ref 850–3900)
MCH: 31.5 pg (ref 27.0–33.0)
MCHC: 35.7 g/dL (ref 32.0–36.0)
MCV: 88.2 fL (ref 80.0–100.0)
MPV: 9.7 fL (ref 7.5–12.5)
Monocytes Relative: 7.4 %
NEUTROS ABS: 3899 {cells}/uL (ref 1500–7800)
Neutrophils Relative %: 58.2 %
PLATELETS: 156 10*3/uL (ref 140–400)
RBC: 4.99 10*6/uL (ref 4.20–5.80)
RDW: 12.5 % (ref 11.0–15.0)
TOTAL LYMPHOCYTE: 31.9 %
WBC mixed population: 496 cells/uL (ref 200–950)
WBC: 6.7 10*3/uL (ref 3.8–10.8)

## 2018-02-19 MED ORDER — PRAVASTATIN SODIUM 20 MG PO TABS: 20.0000 mg | ORAL_TABLET | Freq: Every day | ORAL | 1 refills | Status: DC

## 2018-02-19 MED ORDER — GABAPENTIN 300 MG PO CAPS: 300.0000 mg | ORAL_CAPSULE | Freq: Three times a day (TID) | ORAL | 0 refills | Status: DC

## 2018-02-19 MED ORDER — LISINOPRIL 20 MG PO TABS: 20.0000 mg | ORAL_TABLET | Freq: Every day | ORAL | 1 refills | Status: DC

## 2018-02-19 MED ORDER — VITAMIN D (ERGOCALCIFEROL) 1.25 MG (50000 UNIT) PO CAPS: 50000.0000 [IU] | ORAL_CAPSULE | ORAL | 1 refills | Status: DC

## 2018-02-19 NOTE — Progress Notes (Signed)
Name: Mark Nash   MRN: [id number]    DOB: 1956-07-20   Date:02/19/2018       Progress Note  Subjective  Chief Complaint  Chief Complaint  Patient presents with  . Medication Refill    4 month F/U  . Diabetes    Does not check sugar at home  . Hypertension    headaches and BP is trending up  . Hyperlipidemia  . Schizophrenia  . Constipation    Has not had to use Amitiza due to symptoms have been controlled since colonoscopy    HPI  Lumbar radiculitis: he had back surgery in 06/2016 and 08/2016. He is currently doing well, states uses heating pad and is taking gabapentin and Robaxin and seems to be working for him. Still has pain on his legs but controlled.   DMII: heis still on Levemir4 units daily , hgbA1C was 6.8% , 6.0, 6.7%, 6.8%, , 6.3% today is up to 7 %He has episodes of  Polydipsia associated polyuria but no  polyphagia.He is on ace and a statin, and has neuropathy. Pain from neuropathy still intense, taking Gabapentin to tid. He has not been checking glucose lately, lost another tooth and also states craving sweets lately, under more stress carrying for mother, brother and sister that fell recently.   Lower extremity edema and history of claudication: symptoms of leg swelling since the end of January 2017seen by vascular surgeon, per patient studies negative and swelling has been downsince February 2018 He still has some intermittent claudication. Unchanged.   COPD: he is not on medication, because he refuses, healways hasdaily productive cough in am's.Hehas some SOB when he starts walking but resolves within a few minutes - he takes deeps breaths to improve symptoms.[name] He is still smoking about 1 and half pack of cigarettes daily but not ready to quit smoking yet. He used to smoke 2 packs daily.  Hyperlipidemia: taking Pravastatin, only half pill at night, sent 20 mg this time, so he will take one daily Last lipid pane 12/15/2016 and was at goal.No  myalgias.  HTN: taking bp medication and denies side effects, bp is at goal, no chest pain or palpitation,. Unchanged   Schizophrenia: denies any hallucinations, no depressed mood or suicidal thoughts or ideation, feeling well, not on medication and refuses to see Psychiatrist. Unchanged   Hepatitis C antibody positive: he has a remote history of drug use from age 54 till 1. Used injectable drug use at the time, he has positive hepatitis C , he went to see GI but did not discuss hepatitis C. He will bring it up on his next visit with her.   Chronic Constipation: he has intermittent periods of constipation. [place] was1-2, dry and hadto strain, He was on Amitiza but had episodes of explosive diarrhea, and was advised by GI to change to Miralax. He had EGD and colonoscopy, needs to repeat in one year, was given omeprazole but states could not tolerate, advised him to discuss it with her. He is aware that nsaid's not good for him   Left knee pain: seen by Ortho and also Rheumatologist, for now told to take Tylenol and also told he has osteoarthritis, he states pain on left knee is intermittent and usually triggered by activity, better with rest also with meloxicam . Pain is described as aching   Senile purpura: both arms. Recheck labs   Patient Active Problem List   Diagnosis Date Noted  . Abdominal pain, epigastric   . CLE (  columnar lined esophagus)   . Gastric irritation   . Duodenum ulcer   . Personal history of colonic polyps   . Encounter for screening colonoscopy   . Polyp of sigmoid colon   . Diverticulosis of large intestine without diverticulitis   . Benign neoplasm of ascending colon   . Osteoarthritis of both hands 09/04/2017  . Chronic pain of left knee 09/04/2017  . Abnormal MRI, knee 09/04/2017  . 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 Lymphedema 07/07/2016  . Chronic hepatitis C without hepatic coma ([place]) 06/01/2016  . Lumbar herniated disc 11/24/2015  .  Protein malnutrition ([place]) 11/11/2015  . Thrombocytopenia ([place]) 11/11/2015  . Hepatitis C antibody test positive 07/13/2015  . Chronic constipation 07/13/2015  . Abnormal immunological findings in specimens from other organs, systems and tissues 07/13/2015  . CN (constipation) 07/13/2015  . Benign hypertension 03/08/2015  . COPD, mild ([place]) 03/08/2015  . Dyslipidemia 03/08/2015  . Paranoid schizophrenia ([place]) 03/08/2015  . Acquired polyneuropathy 03/08/2015  . Restless leg 03/08/2015  . Tobacco abuse 03/08/2015  . Callus of foot 03/08/2015  . Claudication ([place]) 03/08/2015  . Chronic obstructive pulmonary disease ([place]) 03/08/2015  . Corn or callus 03/08/2015  . Essential (primary) hypertension 03/08/2015  . HLD (hyperlipidemia) 03/08/2015  . Peripheral vascular disease ([place]) 03/08/2015  . Polyneuropathy 03/08/2015  . Current tobacco use 03/08/2015  . Vitamin D deficiency 04/26/2009  . Avitaminosis D 04/26/2009    Past Surgical History:  Procedure Laterality Date  . BACK SURGERY    . COLONOSCOPY WITH PROPOFOL N/A 09/07/2017   Procedure: COLONOSCOPY WITH PROPOFOL;  Surgeon: Virgel Manifold, MD;  Location: [facility];  Service: Endoscopy;  Laterality: N/A;  . ESOPHAGOGASTRODUODENOSCOPY (EGD) WITH PROPOFOL N/A 12/07/2017   Procedure: ESOPHAGOGASTRODUODENOSCOPY (EGD) WITH PROPOFOL;  Surgeon: Virgel Manifold, MD;  Location: [facility];  Service: Endoscopy;  Laterality: N/A;  . FLEXIBLE SIGMOIDOSCOPY N/A 11/09/2017   Procedure: FLEXIBLE SIGMOIDOSCOPY;  Surgeon: Virgel Manifold, MD;  Location: [facility];  Service: Endoscopy;  Laterality: N/A;  . HERNIA REPAIR     inguinal  . L3 TO L5 LAMINECTOMY FOR DECOMPRESSION  07/17/2016   [facility]  . REPAIR OF CEREBROSPINAL FLUID LEAK N/A 09/08/2016   Procedure: Lumbar wound exploration, repair of pseudomenigocele;  Surgeon: Kevan Ny Ditty, MD;  Location: [facility];  Service: Neurosurgery;   Laterality: N/A;  Lumbar wound exploration, repair of pseudomenigocele  . SINUSOTOMY      Family History  Problem Relation Age of Onset  . Hypertension Mother   . Diverticulitis Mother   . Colitis Mother   . Diabetes Father   . Hypertension Father   . Heart disease Father   . Diverticulitis Brother   . Diabetes Sister     Social History   Socioeconomic History  . Marital status: Divorced    Spouse name: Not on file  . Number of children: 2  . Years of education: Not on file  . Highest education level: 12th grade  Occupational History  . Occupation: Disabled  Social Needs  . Financial resource strain: Not hard at all  . Food insecurity:    Worry: Never true    Inability: Never true  . Transportation needs:    Medical: No    Non-medical: No  Tobacco Use  . Smoking status: Current Every Day Smoker    Packs/day: 1.50    Years: 46.00    Pack years: 69.00    Types: Cigarettes  . Smokeless tobacco: Never Used  Substance and Sexual Activity  . Alcohol use: No    Alcohol/week: 0.0 oz  . Drug use: No  . Sexual activity: Not Currently  Lifestyle  . Physical activity:    Days per week: 1 day    Minutes per session: 20 min  . Stress: Not at all  Relationships  . Social connections:    Talks on phone: Patient refused    Gets together: Patient refused    Attends religious service: Patient refused    Active member of club or organization: Patient refused    Attends meetings of clubs or organizations: Patient refused    Relationship status: Divorced  . Intimate partner violence:    Fear of current or ex partner: No    Emotionally abused: No    Physically abused: No    Forced sexual activity: No  Other Topics Concern  . Not on file  Social History Narrative   Lives alone     Current Outpatient Medications:  .  gabapentin (NEURONTIN) 300 MG capsule, Take 1 capsule (300 mg total) by mouth 3 (three) times daily., Disp: 270 capsule, Rfl: 0 .  LEVEMIR 100 UNIT/ML  injection, INJECT 5 UNITS SUBCUTANEOUSLY IN THE EVENING (Patient taking differently: INJECT 4.5 UNITS SUBCUTANEOUSLY IN THE EVENING), Disp: 10 mL, Rfl: 0 .  lisinopril (PRINIVIL,ZESTRIL) 20 MG tablet, Take 1 tablet (20 mg total) by mouth daily., Disp: 90 tablet, Rfl: 1 .  meloxicam (MOBIC) 7.5 MG tablet, TAKE 1 TABLET BY MOUTH ONCE DAILY, Disp: , Rfl:  .  methocarbamol (ROBAXIN) 750 MG tablet, TAKE 1 TABLET BY MOUTH EVERY 8 HOURS AS NEEDED FOR MUSCLE SPASM, Disp: 90 tablet, Rfl: 2 .  pravastatin (PRAVACHOL) 20 MG tablet, Take 1 tablet (20 mg total) by mouth daily., Disp: 90 tablet, Rfl: 1 .  urea (CARMOL) 40 % CREA, Apply topically., Disp: , Rfl:  .  Vitamin D, Ergocalciferol, (DRISDOL) 50000 units CAPS capsule, Take 1 capsule (50,000 Units total) by mouth every 7 (seven) days., Disp: 12 capsule, Rfl: 1  Allergies  Allergen Reactions  . Aspirin Nausea Only and Other (See Comments)  . Penicillins Hives and Swelling    SWELLING REACTION UNSPECIFIED   Has patient had a PCN reaction causing immediate rash, facial/tongue/throat swelling, SOB or lightheadedness with hypotension: #  #  #  YES  #  #  #  Has patient had a PCN reaction causing severe rash involving mucus membranes or skin necrosis:  #  #  #  UNKNOWN  #  #  #   Has patient had a PCN reaction that required hospitalization:  #  #  #  UNKNOWN  #  #  #  Has patient had a PCN reaction occurring within the last 10 years:  #  #  #  NO  #  #  #   . Lyrica [Pregabalin] Other (See Comments)    intolerance     ROS  Constitutional: Negative for fever or weight change.  Respiratory: Positive  for cough and has occasional  shortness of breath.   Cardiovascular: Negative for chest pain or palpitations.  Gastrointestinal: Negative for abdominal pain, no bowel changes.  Musculoskeletal: Positive  for intermittent gait problem - secondary to PVD - has to stop , and intermittent left joint swelling.  Skin: Negative for rash.  Neurological: Negative  for dizziness or headache.  No other specific complaints in a complete review of systems (except as listed in HPI above).   Objective  Vitals:  02/19/18 1307  BP: (!) 160/100  Pulse: 78  Resp: 16  Temp: 98.2 F (36.8 C)  TempSrc: Oral  SpO2: 95%  Weight: 164 lb 3.2 oz (74.5 kg)  Height: 5\' 11"  (1.803 m)    Body mass index is 22.9 kg/m.  Physical Exam  Constitutional: Patient appears well-developed and well-nourished. No distress.  HEENT: head atraumatic, normocephalic, pupils equal and reactive to light,  neck supple, throat within normal limits Cardiovascular: Normal rate, regular rhythm and normal heart sounds.  No murmur heard. No BLE edema. Pulmonary/Chest: Effort normal and breath sounds normal. No respiratory distress. Abdominal: Soft.  There is no tenderness. Psychiatric: Patient has a normal mood and affect. behavior is normal. Judgment and thought content normal. Skin: purpura both arms   Recent Results (from the past 2160 hour(s))  Glucose, capillary     Status: None   Collection Time: 12/07/17  8:39 AM  Result Value Ref Range   Glucose-Capillary 84 65 - 99 mg/dL  Surgical pathology     Status: None   Collection Time: 12/07/17  9:08 AM  Result Value Ref Range   SURGICAL PATHOLOGY      Surgical Pathology CASE: [id number] PATIENT: Keinan Ledonne Surgical Pathology Report     SPECIMEN SUBMITTED: A. Stomach, R/O H pylori; cbxs B. GEJ, salmon colored; cbxs  CLINICAL HISTORY: None provided  PRE-OPERATIVE DIAGNOSIS: Abdominal pain epigastric R10.13  POST-OPERATIVE DIAGNOSIS: Gastric erythema, duodenal bulb ulcer     DIAGNOSIS: A.  STOMACH; COLD BIOPSY: - ANTRAL AND OXYNTIC MUCOSA WITH MINIMAL CHRONIC GASTRITIS AND REGENERATIVE/REPARATIVE CHANGE. - NEGATIVE FOR H. PYLORI, DYSPLASIA AND MALIGNANCY.  B.  GE JUNCTION; COLD BIOPSY: - PRIMARILY SQUAMOUS MUCOSA AND SCANT COLUMNAR MUCOSA, NEGATIVE FOR GOBLET CELLS.   GROSS DESCRIPTION: A.  Labeled: Gastric cbx rule out H. pylori Received: In formalin Tissue fragment(s): 5 Size: 0.2-0.5 cm Description: Pink-Tan fragments Entirely submitted in one cassette.  B. Labeled: Salmon colored mucosa GEJ cbx Received: In formalin Tissue fragment(s): Multiple Size: 1.0 x 0.2 x 0.1 cm Description : Wispy the pink-tan fragments Entirely submitted in one cassette.    Final Diagnosis performed by Delorse Lek, MD.   Electronically signed 12/11/2017 4:43:12PM The electronic signature indicates that the named Attending Pathologist has evaluated the specimen  Technical component performed at [facility], [address], [place], [place] [address] Lab: [phone] Dir: Rush Farmer, MD, MMM  Professional component performed at [facility], [facility], [place], [place], [place] [address] Lab: [phone] Dir: Dellia Nims. Reuel Derby, MD   HM DIABETES FOOT EXAM     Status: None   Collection Time: 02/11/18 12:00 AM  Result Value Ref Range   HM Diabetic Foot Exam Normal     Comment: Dr. Elvina Mattes- [facility]  POCT HgB A1C     Status: Abnormal   Collection Time: 02/19/18  1:16 PM  Result Value Ref Range   Hemoglobin A1C 6.9 (A) 4.0 - 5.6 %   HbA1c POC (<> result, manual entry)  4.0 - 5.6 %   HbA1c, POC (prediabetic range)  5.7 - 6.4 %   HbA1c, POC (controlled diabetic range)  0.0 - 7.0 %     PHQ2/9: Depression screen [facility] 2/9 02/19/2018 11/30/2017 10/22/2017 07/17/2017 03/30/2017  Decreased Interest 0 0 0 0 0  Down, Depressed, Hopeless 0 0 0 0 0  PHQ - 2 Score 0 0 0 0 0  Altered sleeping 0 0 - - -  Tired, decreased energy 0 0 - - -  Change in appetite  0 0 - - -  Feeling bad or failure about yourself  0 0 - - -  Trouble concentrating 0 0 - - -  Moving slowly or fidgety/restless 0 0 - - -  Suicidal thoughts 0 0 - - -  PHQ-9 Score - 0 - - -  Difficult doing work/chores Not difficult at all Not difficult at all - - -     Fall Risk: Fall Risk  02/19/2018 11/30/2017 10/22/2017  07/17/2017 03/30/2017  Falls in the past year? No No No No No  Risk for fall due to : - Impaired vision - - -     Functional Status Survey: Is the patient deaf or have difficulty hearing?: No Does the patient have difficulty seeing, even when wearing glasses/contacts?: No Does the patient have difficulty concentrating, remembering, or making decisions?: No Does the patient have difficulty walking or climbing stairs?: No Does the patient have difficulty dressing or bathing?: No Does the patient have difficulty doing errands alone such as visiting a doctor's office or shopping?: No    Assessment & Plan  1. Type 2 diabetes mellitus with complication, with long-term current use of insulin (HCC)  - POCT HgB A1C  2. Paranoid schizophrenia ([place])  stable  3. COPD, mild ([place])  Refuses medication   4. Type 2 diabetes mellitus with diabetic polyneuropathy, with long-term current use of insulin ([place])   5. Claudication ([place])  Stable, seen by vascular surgeon   6. Essential (primary) hypertension  - COMPLETE METABOLIC PANEL WITH GFR - CBC with Differential/Platelet - lisinopril (PRINIVIL,ZESTRIL) 20 MG tablet; Take 1 tablet (20 mg total) by mouth daily.  Dispense: 90 tablet; Refill: 1  7. Acquired polyneuropathy  - gabapentin (NEURONTIN) 300 MG capsule; Take 1 capsule (300 mg total) by mouth 3 (three) times daily.  Dispense: 270 capsule; Refill: 0  8. Dyslipidemia  - Lipid panel - pravastatin (PRAVACHOL) 20 MG tablet; Take 1 tablet (20 mg total) by mouth daily.  Dispense: 90 tablet; Refill: 1  9. Peripheral vascular disease ([place])  He lost to follow up with vascular surgeon, states doing well, taking statin therapy and wearing compression stocking hoses prn only   10. Chronic bilateral low back pain with sciatica, sciatica laterality unspecified  Stable  11. Purpura, nonthrombocytopenic (HCC)  Both arms has ecchymosis   12. Benign hypertension  - lisinopril  (PRINIVIL,ZESTRIL) 20 MG tablet; Take 1 tablet (20 mg total) by mouth daily.  Dispense: 90 tablet; Refill: 1  13. Vitamin D deficiency  - Vitamin D, Ergocalciferol, (DRISDOL) 50000 units CAPS capsule; Take 1 capsule (50,000 Units total) by mouth every 7 (seven) days.  Dispense: 12 capsule; Refill: 1

## 2018-03-05 DIAGNOSIS — R936 Abnormal findings on diagnostic imaging of limbs: Secondary | ICD-10-CM | POA: Diagnosis not present

## 2018-03-05 DIAGNOSIS — M19041 Primary osteoarthritis, right hand: Secondary | ICD-10-CM | POA: Diagnosis not present

## 2018-03-05 DIAGNOSIS — M25562 Pain in left knee: Secondary | ICD-10-CM | POA: Diagnosis not present

## 2018-03-05 DIAGNOSIS — G8929 Other chronic pain: Secondary | ICD-10-CM | POA: Diagnosis not present

## 2018-03-05 DIAGNOSIS — M19042 Primary osteoarthritis, left hand: Secondary | ICD-10-CM | POA: Diagnosis not present

## 2018-03-08 ENCOUNTER — Encounter: Payer: Self-pay | Admitting: *Deleted

## 2018-03-18 ENCOUNTER — Ambulatory Visit: Payer: PPO | Admitting: Gastroenterology

## 2018-03-19 ENCOUNTER — Other Ambulatory Visit: Payer: Self-pay | Admitting: Family Medicine

## 2018-03-19 DIAGNOSIS — E1140 Type 2 diabetes mellitus with diabetic neuropathy, unspecified: Secondary | ICD-10-CM

## 2018-03-19 NOTE — Telephone Encounter (Signed)
Request for diabetes medication. Levemir   Last office visit pertaining to diabetes: 02/19/18  Lab Results  Component Value Date   HGBA1C 6.9 (A) 02/19/2018     Follow up on 06/21/18

## 2018-04-09 ENCOUNTER — Other Ambulatory Visit: Payer: Self-pay | Admitting: Family Medicine

## 2018-04-09 DIAGNOSIS — G8929 Other chronic pain: Secondary | ICD-10-CM

## 2018-04-09 DIAGNOSIS — M5440 Lumbago with sciatica, unspecified side: Principal | ICD-10-CM

## 2018-04-09 NOTE — Telephone Encounter (Signed)
Refill request for general medication. Methocarbamol to [facility].  Last office visit: 02/19/18  Follow up 06/21/2018

## 2018-04-17 ENCOUNTER — Ambulatory Visit: Payer: PPO | Admitting: Gastroenterology

## 2018-05-29 ENCOUNTER — Encounter: Payer: Self-pay | Admitting: Family Medicine

## 2018-05-29 ENCOUNTER — Ambulatory Visit (INDEPENDENT_AMBULATORY_CARE_PROVIDER_SITE_OTHER): Payer: PPO | Admitting: Family Medicine

## 2018-05-29 VITALS — BP 164/78 | HR 77 | Temp 98.2°F | Resp 16 | Ht 71.0 in | Wt 164.0 lb

## 2018-05-29 DIAGNOSIS — Z23 Encounter for immunization: Secondary | ICD-10-CM

## 2018-05-29 DIAGNOSIS — I10 Essential (primary) hypertension: Secondary | ICD-10-CM

## 2018-05-29 DIAGNOSIS — K439 Ventral hernia without obstruction or gangrene: Secondary | ICD-10-CM | POA: Diagnosis not present

## 2018-05-29 DIAGNOSIS — R1031 Right lower quadrant pain: Secondary | ICD-10-CM | POA: Diagnosis not present

## 2018-05-29 DIAGNOSIS — G8929 Other chronic pain: Secondary | ICD-10-CM | POA: Diagnosis not present

## 2018-05-29 DIAGNOSIS — M25562 Pain in left knee: Secondary | ICD-10-CM | POA: Diagnosis not present

## 2018-05-29 MED ORDER — HYDROCHLOROTHIAZIDE 12.5 MG PO TABS: 12.5000 mg | ORAL_TABLET | Freq: Every day | ORAL | 0 refills | Status: DC

## 2018-05-29 NOTE — Progress Notes (Signed)
Name: Mark Nash   MRN: [id number]    DOB: Oct 04, 1955   Date:05/29/2018       Progress Note  Subjective  Chief Complaint  Chief Complaint  Patient presents with  . Inguinal Hernia    patient stated that he has been having some issues with groin pain for about a month  . Immunizations    flu shot    HPI  Abdominal wall problem: he states he has a history of right inguinal hernia repair many years ago in [facility] and over the past year he has noticed intermittent pain on right groin with some bulging, and feels comfortable/pressure like that improves when he applies pressure. Over the past few weeks ago he noticed pain and large bulging on left lower quadrant and sometimes all the way to left flank. No redness or increase in warmth, worse when coughing or bearing down, better when he holds his abdominal wall.   HTN: bp has been elevated, he is taking lisinopril daily and denies chest pain or palpitation. We will add hctz today   Left knee osteoarthritis: recently seen by Dr. Meda Coffee . He was referred there from Ortho because of bone marrow edema seen on MRI, she advised nsaid's , and was given meloxicam. He states pain is not as severe now. Pain improves with rest and elevation. Not currently having swelling. Possible crystal arthropathy.   Patient Active Problem List   Diagnosis Date Noted  . Abdominal pain, epigastric   . CLE (columnar lined esophagus)   . Gastric irritation   . Duodenum ulcer   . Personal history of colonic polyps   . Encounter for screening colonoscopy   . Polyp of sigmoid colon   . Diverticulosis of large intestine without diverticulitis   . Benign neoplasm of ascending colon   . Osteoarthritis of both hands 09/04/2017  . Chronic pain of left knee 09/04/2017  . Abnormal MRI, knee 09/04/2017  . 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 Lymphedema 07/07/2016  . Chronic hepatitis C without hepatic coma ([place]) 06/01/2016  . Lumbar herniated disc  11/24/2015  . Protein malnutrition ([place]) 11/11/2015  . Thrombocytopenia ([place]) 11/11/2015  . Hepatitis C antibody test positive 07/13/2015  . Chronic constipation 07/13/2015  . Abnormal immunological findings in specimens from other organs, systems and tissues 07/13/2015  . CN (constipation) 07/13/2015  . Benign hypertension 03/08/2015  . COPD, mild ([place]) 03/08/2015  . Dyslipidemia 03/08/2015  . Paranoid schizophrenia ([place]) 03/08/2015  . Acquired polyneuropathy 03/08/2015  . Restless leg 03/08/2015  . Tobacco abuse 03/08/2015  . Callus of foot 03/08/2015  . Claudication ([place]) 03/08/2015  . Chronic obstructive pulmonary disease ([place]) 03/08/2015  . Corn or callus 03/08/2015  . Essential (primary) hypertension 03/08/2015  . HLD (hyperlipidemia) 03/08/2015  . Peripheral vascular disease ([place]) 03/08/2015  . Polyneuropathy 03/08/2015  . Current tobacco use 03/08/2015  . Vitamin D deficiency 04/26/2009  . Avitaminosis D 04/26/2009    Past Surgical History:  Procedure Laterality Date  . BACK SURGERY    . COLONOSCOPY WITH PROPOFOL N/A 09/07/2017   Procedure: COLONOSCOPY WITH PROPOFOL;  Surgeon: Virgel Manifold, MD;  Location: [facility] ENDOSCOPY;  Service: Endoscopy;  Laterality: N/A;  . ESOPHAGOGASTRODUODENOSCOPY (EGD) WITH PROPOFOL N/A 12/07/2017   Procedure: ESOPHAGOGASTRODUODENOSCOPY (EGD) WITH PROPOFOL;  Surgeon: Virgel Manifold, MD;  Location: [facility] ENDOSCOPY;  Service: Endoscopy;  Laterality: N/A;  . FLEXIBLE SIGMOIDOSCOPY N/A 11/09/2017   Procedure: FLEXIBLE SIGMOIDOSCOPY;  Surgeon: Virgel Manifold, MD;  Location: [facility] ENDOSCOPY;  Service:  Endoscopy;  Laterality: N/A;  . HERNIA REPAIR     inguinal  . L3 TO L5 LAMINECTOMY FOR DECOMPRESSION  07/17/2016   [facility]  . REPAIR OF CEREBROSPINAL FLUID LEAK N/A 09/08/2016   Procedure: Lumbar wound exploration, repair of pseudomenigocele;  Surgeon: Kevan Ny Ditty, MD;  Location: [facility];  Service:  Neurosurgery;  Laterality: N/A;  Lumbar wound exploration, repair of pseudomenigocele  . SINUSOTOMY      Family History  Problem Relation Age of Onset  . Hypertension Mother   . Diverticulitis Mother   . Colitis Mother   . Diabetes Father   . Hypertension Father   . Heart disease Father   . Diverticulitis Brother   . Diabetes Sister     Social History   Socioeconomic History  . Marital status: Divorced    Spouse name: Not on file  . Number of children: 2  . Years of education: Not on file  . Highest education level: 12th grade  Occupational History  . Occupation: Disabled  Social Needs  . Financial resource strain: Not hard at all  . Food insecurity:    Worry: Never true    Inability: Never true  . Transportation needs:    Medical: No    Non-medical: No  Tobacco Use  . Smoking status: Current Every Day Smoker    Packs/day: 1.50    Years: 46.00    Pack years: 69.00    Types: Cigarettes  . Smokeless tobacco: Never Used  Substance and Sexual Activity  . Alcohol use: No    Alcohol/week: 0.0 standard drinks  . Drug use: No  . Sexual activity: Not Currently  Lifestyle  . Physical activity:    Days per week: 1 day    Minutes per session: 20 min  . Stress: Not at all  Relationships  . Social connections:    Talks on phone: Three times a week    Gets together: Three times a week    Attends religious service: Never    Active member of club or organization: No    Attends meetings of clubs or organizations: Never    Relationship status: Divorced  . Intimate partner violence:    Fear of current or ex partner: No    Emotionally abused: No    Physically abused: No    Forced sexual activity: No  Other Topics Concern  . Not on file  Social History Narrative   Lives alone     Current Outpatient Medications:  .  gabapentin (NEURONTIN) 300 MG capsule, Take 1 capsule (300 mg total) by mouth 3 (three) times daily., Disp: 270 capsule, Rfl: 0 .  LEVEMIR 100 UNIT/ML  injection, INJECT 5 UNITS SUBCUTANEOUSLY ONCE DAILY IN THE EVENING, Disp: 10 mL, Rfl: 0 .  lisinopril (PRINIVIL,ZESTRIL) 20 MG tablet, Take 1 tablet (20 mg total) by mouth daily., Disp: 90 tablet, Rfl: 1 .  methocarbamol (ROBAXIN) 750 MG tablet, TAKE 1 TABLET BY MOUTH EVERY 8 HOURS AS NEEDED FOR MUSCLE SPASM, Disp: 90 tablet, Rfl: 2 .  pravastatin (PRAVACHOL) 20 MG tablet, Take 1 tablet (20 mg total) by mouth daily., Disp: 90 tablet, Rfl: 1 .  Vitamin D, Ergocalciferol, (DRISDOL) 50000 units CAPS capsule, Take 1 capsule (50,000 Units total) by mouth every 7 (seven) days., Disp: 12 capsule, Rfl: 1  Allergies  Allergen Reactions  . Aspirin Nausea Only and Other (See Comments)  . Penicillins Hives and Swelling    SWELLING REACTION UNSPECIFIED   Has patient  had a PCN reaction causing immediate rash, facial/tongue/throat swelling, SOB or lightheadedness with hypotension: #  #  #  YES  #  #  #  Has patient had a PCN reaction causing severe rash involving mucus membranes or skin necrosis:  #  #  #  UNKNOWN  #  #  #   Has patient had a PCN reaction that required hospitalization:  #  #  #  UNKNOWN  #  #  #  Has patient had a PCN reaction occurring within the last 10 years:  #  #  #  NO  #  #  #   . Lyrica [Pregabalin] Other (See Comments)    intolerance    I personally reviewed active problem list, medication list, allergies, family history, social history with the patient/caregiver today.   ROS  Constitutional: Negative for fever or weight change.  Respiratory: Negative for cough and shortness of breath.   Cardiovascular: Negative for chest pain or palpitations.  Gastrointestinal: Negative for abdominal pain, no bowel changes.  Musculoskeletal: Negative for gait problem , positive for intermittent joint swelling.  Skin: Negative for rash.  Neurological: Negative for dizziness or headache.  No other specific complaints in a complete review of systems (except as listed in HPI  above).  Objective  Vitals:   05/29/18 1414  BP: (!) 164/78  Pulse: 77  Resp: 16  Temp: 98.2 F (36.8 C)  TempSrc: Oral  SpO2: 98%  Weight: 164 lb (74.4 kg)  Height: 5\' 11"  (1.803 m)    Body mass index is 22.87 kg/m.  Physical Exam  Constitutional: Patient appears well-developed and well-nourished.No distress.  HEENT: head atraumatic, normocephalic, pupils equal and reactive to light, neck supple, throat within normal limits Cardiovascular: Normal rate, regular rhythm and normal heart sounds.  No murmur heard. No BLE edema. Pulmonary/Chest: Effort normal and breath sounds normal. No respiratory distress. Abdominal: Soft.  There is normal bowel sounds, no masses when on dorsal decubitus, but when standing and bearing down large bulging on left lower quadrant going towards left flank, no redness, no inguinal hernia  Muscular skeletal: decreased abduction of hips Psychiatric: Patient has a normal mood and affect. behavior is normal. Judgment and thought content normal.  PHQ2/9: Depression screen [facility] 2/9 05/29/2018 02/19/2018 11/30/2017 10/22/2017 07/17/2017  Decreased Interest 0 0 0 0 0  Down, Depressed, Hopeless 0 0 0 0 0  PHQ - 2 Score 0 0 0 0 0  Altered sleeping 0 0 0 - -  Tired, decreased energy 0 0 0 - -  Change in appetite 0 0 0 - -  Feeling bad or failure about yourself  0 0 0 - -  Trouble concentrating 0 0 0 - -  Moving slowly or fidgety/restless 0 0 0 - -  Suicidal thoughts 0 0 0 - -  PHQ-9 Score 0 - 0 - -  Difficult doing work/chores - Not difficult at all Not difficult at all - -     Fall Risk: Fall Risk  02/19/2018 11/30/2017 10/22/2017 07/17/2017 03/30/2017  Falls in the past year? No No No No No  Risk for fall due to : - Impaired vision - - -    Functional Status Survey: Is the patient deaf or have difficulty hearing?: No Does the patient have difficulty seeing, even when wearing glasses/contacts?: No Does the patient have difficulty concentrating, remembering,  or making decisions?: No Does the patient have difficulty walking or climbing stairs?: Yes(due to the groin pain)  Does the patient have difficulty dressing or bathing?: No Does the patient have difficulty doing errands alone such as visiting a doctor's office or shopping?: No    Assessment & Plan  1. Hernia of abdominal wall  - Ambulatory referral to General Surgery  2. Need for influenza vaccination  - Flu Vaccine QUAD 6+ mos PF IM (Fluarix Quad PF)  3. Right groin pain  - Ambulatory referral to General Surgery Pain not intense as it was, it may be secondary to osteoarthritis of hip, but because of intermittent bulging and previous surgery we will refer him to surgeon  4. Essential (primary) hypertension  BP has been high over the past few months and we will add hctz - hydrochlorothiazide (HYDRODIURIL) 12.5 MG tablet; Take 1 tablet (12.5 mg total) by mouth daily.  Dispense: 30 tablet; Refill: 0  5. Chronic pain of left knee  Keep appointment with ortho as recommended by Dr. Meda Coffee

## 2018-05-30 ENCOUNTER — Encounter: Payer: Self-pay | Admitting: *Deleted

## 2018-06-06 ENCOUNTER — Encounter: Payer: Self-pay | Admitting: Surgery

## 2018-06-06 ENCOUNTER — Ambulatory Visit (INDEPENDENT_AMBULATORY_CARE_PROVIDER_SITE_OTHER): Payer: PPO | Admitting: Surgery

## 2018-06-06 VITALS — BP 189/92 | HR 76 | Temp 98.1°F | Ht 71.0 in | Wt 163.0 lb

## 2018-06-06 DIAGNOSIS — K4090 Unilateral inguinal hernia, without obstruction or gangrene, not specified as recurrent: Secondary | ICD-10-CM

## 2018-06-06 NOTE — H&P (View-Only) (Signed)
Surgical Clinic History and Physical  Referring provider:  [name], MD [address] [place], [place] [address]  HISTORY OF PRESENT ILLNESS (HPI):  62 y.o. Nash presents for evaluation of Left groin pain. Patient reports he first noticed his Left groin pain when he awoke to an episode of likely smoking-associated coughing, after which he noted a Left groin bulge, since which he has experienced Left groin pain and a bulge that he describes appears to be more prominent with heavy lifting, strenuous coughing, or during BM's, though he acknowledges only "very rare" constipation since he underwent recent colonoscopy. He likewise denies any straining with urination. He recalls that he also had his Right inguinal hernia repaired 12 years ago at [facility], but has over the past year been experiencing a "stabbing" Right groin pain that is worse with the same activities that exacerbate his Left groin pain. However, he has not noted any detectable Right groin bulge. Nonetheless, patient requests repair of B/L inguinal hernias if possible at the same time. Of note, patient's Left inguinal hernia was likewise appreciated and confirmed during recent primary care physician appointment. Patient says he is currently attempting to quit smoking.  PAST MEDICAL HISTORY (PMH):  Past Medical History:  Diagnosis Date  . Arthritis   . Asthma   . Constipation   . COPD (chronic obstructive pulmonary disease) ([place])   . Diabetes mellitus without complication (HCC)    Type 2  . GERD (gastroesophageal reflux disease)   . Hepatitis C   . History of kidney stones   . Hyperlipidemia   . Hypertension   . Restless legs   . Schizoaffective disorder ([place])    Pt denies  . Vitamin D deficiency     PAST SURGICAL HISTORY ([place]):  Past Surgical History:  Procedure Laterality Date  . BACK SURGERY    . COLONOSCOPY WITH PROPOFOL N/A 09/07/2017   Procedure: COLONOSCOPY WITH PROPOFOL;  Surgeon: Virgel Manifold,  MD;  Location: [facility];  Service: Endoscopy;  Laterality: N/A;  . ESOPHAGOGASTRODUODENOSCOPY (EGD) WITH PROPOFOL N/A 12/07/2017   Procedure: ESOPHAGOGASTRODUODENOSCOPY (EGD) WITH PROPOFOL;  Surgeon: Virgel Manifold, MD;  Location: [facility];  Service: Endoscopy;  Laterality: N/A;  . FLEXIBLE SIGMOIDOSCOPY N/A 11/09/2017   Procedure: FLEXIBLE SIGMOIDOSCOPY;  Surgeon: Virgel Manifold, MD;  Location: [facility];  Service: Endoscopy;  Laterality: N/A;  . HERNIA REPAIR     inguinal  . L3 TO L5 LAMINECTOMY FOR DECOMPRESSION  07/17/2016   [facility]  . REPAIR OF CEREBROSPINAL FLUID LEAK N/A 09/08/2016   Procedure: Lumbar wound exploration, repair of pseudomenigocele;  Surgeon: Kevan Ny Ditty, MD;  Location: [facility];  Service: Neurosurgery;  Laterality: N/A;  Lumbar wound exploration, repair of pseudomenigocele  . SINUSOTOMY      MEDICATIONS:  Prior to Admission medications   Medication Sig Start Date End Date Taking? Authorizing Provider  gabapentin (NEURONTIN) 300 MG capsule Take 1 capsule (300 mg total) by mouth 3 (three) times daily. 02/19/18  Yes Sowles, Drue Stager, MD  hydrochlorothiazide (HYDRODIURIL) 12.5 MG tablet Take 1 tablet (12.5 mg total) by mouth daily. 05/29/18  Yes Sowles, Drue Stager, MD  LEVEMIR 100 UNIT/ML injection INJECT 5 UNITS SUBCUTANEOUSLY ONCE DAILY IN THE EVENING 03/19/18  Yes Sowles, Drue Stager, MD  lisinopril (PRINIVIL,ZESTRIL) 20 MG tablet Take 1 tablet (20 mg total) by mouth daily. 02/19/18  Yes Sowles, Drue Stager, MD  methocarbamol (ROBAXIN) 750 MG tablet TAKE 1 TABLET BY MOUTH EVERY 8 HOURS AS NEEDED FOR MUSCLE SPASM  04/09/18  Yes Sowles, Drue Stager, MD  pravastatin (PRAVACHOL) 20 MG tablet Take 1 tablet (20 mg total) by mouth daily. 02/19/18  Yes Sowles, Drue Stager, MD  Vitamin D, Ergocalciferol, (DRISDOL) 50000 units CAPS capsule Take 1 capsule (50,000 Units total) by mouth every 7 (seven) days. 02/19/18  Yes [name], MD      ALLERGIES:  Allergies  Allergen Reactions  . Aspirin Nausea Only and Other (See Comments)  . Penicillins Hives and Swelling    SWELLING REACTION UNSPECIFIED   Has patient had a PCN reaction causing immediate rash, facial/tongue/throat swelling, SOB or lightheadedness with hypotension: #  #  #  YES  #  #  #  Has patient had a PCN reaction causing severe rash involving mucus membranes or skin necrosis:  #  #  #  UNKNOWN  #  #  #   Has patient had a PCN reaction that required hospitalization:  #  #  #  UNKNOWN  #  #  #  Has patient had a PCN reaction occurring within the last 10 years:  #  #  #  NO  #  #  #   . Lyrica [Pregabalin] Other (See Comments)    intolerance    SOCIAL HISTORY:  Social History   Socioeconomic History  . Marital status: Divorced    Spouse name: Not on file  . Number of children: 2  . Years of education: Not on file  . Highest education level: 12th grade  Occupational History  . Occupation: Disabled  Social Needs  . Financial resource strain: Not hard at all  . Food insecurity:    Worry: Never true    Inability: Never true  . Transportation needs:    Medical: No    Non-medical: No  Tobacco Use  . Smoking status: Current Every Day Smoker    Packs/day: 1.50    Years: 46.00    Pack years: 69.00    Types: Cigarettes  . Smokeless tobacco: Never Used  Substance and Sexual Activity  . Alcohol use: No    Alcohol/week: 0.0 standard drinks  . Drug use: No  . Sexual activity: Not Currently  Lifestyle  . Physical activity:    Days per week: 1 day    Minutes per session: 20 min  . Stress: Not at all  Relationships  . Social connections:    Talks on phone: Three times a week    Gets together: Three times a week    Attends religious service: Never    Active member of club or organization: No    Attends meetings of clubs or organizations: Never    Relationship status: Divorced  . Intimate partner violence:    Fear of current or ex partner: No     Emotionally abused: No    Physically abused: No    Forced sexual activity: No  Other Topics Concern  . Not on file  Social History Narrative   Lives alone    The patient currently resides (home / rehab facility / nursing home): Home The patient normally is (ambulatory / bedbound): Ambulatory  FAMILY HISTORY:  Family History  Problem Relation Age of Onset  . Hypertension Mother   . Diverticulitis Mother   . Colitis Mother   . Diabetes Father   . Hypertension Father   . Heart disease Father   . Diverticulitis Brother   . Diabetes Sister     Otherwise negative/non-contributory.  REVIEW OF SYSTEMS:  Constitutional: denies any other  weight loss, fever, chills, or sweats  Eyes: denies any other vision changes, history of eye injury  ENT: denies sore throat, hearing problems  Respiratory: denies shortness of breath, wheezing  Cardiovascular: denies chest pain, palpitations  Gastrointestinal: abdominal pain, N/V, and bowel function as per HPI Musculoskeletal: denies any other joint pains or cramps  Skin: Denies any other rashes or skin discolorations Neurological: denies any other headache, dizziness, weakness  Psychiatric: Denies any other depression, anxiety   All other review of systems were otherwise negative   VITAL SIGNS:  BP (!) 189/92   Pulse 76   Temp Mark.1 F (36.7 C) (Temporal)   Ht 5\' 11"  (1.803 m)   Wt 163 lb (73.9 kg)   BMI 22.73 kg/m   PHYSICAL EXAM:  Constitutional:  -- Normal body habitus  -- Awake, alert, and oriented x3  Eyes:  -- Pupils equally round and reactive to light  -- No scleral icterus  Ear, nose, throat:  -- Very poor dental hygiene -- No jugular venous distension -- No nasal drainage, bleeding Pulmonary:  -- No crackles  -- Equal breath sounds bilaterally -- Breathing non-labored at rest Cardiovascular:  -- S1, S2 present  -- No pericardial rubs  Gastrointestinal: -- Elongated tender to palpation Left groin bulge, unable to  appreciated Right groin bulge -- Abdomen otherwise soft, nontender, non-distended, no guarding/rebound  -- No other abdominal masses appreciated, pulsatile or otherwise  Musculoskeletal and Integumentary:  -- Wounds or skin discoloration: None appreciated -- Extremities: B/L UE and LE FROM, hands and feet warm, no edema  Neurologic:  -- Motor function: Intact and symmetric -- Sensation: Intact and symmetric  Labs:  CBC Latest Ref Rng & Units 02/19/2018 03/30/2017 12/15/2016  WBC 3.8 - 10.8 Thousand/uL 6.7 6.7 6.0  Hemoglobin 13.2 - 17.1 g/dL 15.7 15.7 14.9  Hematocrit 38.5 - 50.0 % 44.0 45.4 45.1  Platelets 140 - 400 Thousand/uL 156 163 154   CMP Latest Ref Rng & Units 02/19/2018 03/30/2017 12/15/2016  Glucose 65 - 139 mg/dL 116 175(H) 94  BUN 7 - 25 mg/dL 16 10 17   Creatinine 0.70 - 1.25 mg/dL 0.71 0.80 0.78  Sodium 135 - 146 mmol/L 140 138 139  Potassium 3.5 - 5.3 mmol/L 4.4 4.5 4.5  Chloride Mark - 110 mmol/L 103 101 102  CO2 20 - 32 mmol/L 31 28 28   Calcium 8.6 - 10.3 mg/dL 9.4 9.0 9.2  Total Protein 6.1 - 8.1 g/dL 6.5 6.2 6.4  Total Bilirubin 0.2 - 1.2 mg/dL 1.0 1.0 1.1  Alkaline Phos 40 - 115 U/L - 104 107  AST 10 - 35 U/L 14 12 21   ALT 9 - 46 U/L 14 11 26    Imaging studies: No new pertinent imaging studies available for review   Assessment/Plan:  62 y.o. Nash with increasingly symptomatic reducible Left inguinal hernia and possible likewise increasingly symptomatic recurrent Right inguinal hernia, complicated by pertinent comorbidities including DM, HTN, HLD, COPD, osteoarthritis, hepatitis C, and schizoaffective disorder.               - discussed with patient signs and symptoms of hernia incarceration and obstruction             - strategies for manual self-reduction of patient's inguinal hernia(s) also reviewed and discussed  - maintain hydration with high fiber heart healthy diet to reduce/minimize constipation +/- daily stool softener as needed             - all risks,  benefits, and  alternatives to laparoscopic repair of bilateral inguinal hernias with mesh were discussed with the patient, all of his questions were answered to patient's expressed satisfaction, patient expresses he wishes to proceed, and informed consent was obtained.             - will plan for laparoscopic repair of at least Left inguinal hernia hernia with mesh and possible laparoscopic repair of painful possibly recurrent Right inguinal hernia with mesh pending anesthesia and OR availability             - anticipate return to clinic 2 weeks after above planned surgery             - instructed to call if any questions or concerns  - smoking cessation strongly encouraged   All of the above recommendations were discussed with the patient family, and all of patient's questions were answered to his expressed satisfaction.  Thank you for the opportunity to participate in this patient's care.  -- Marilynne Drivers Rosana Hoes, MD, [place]: [facility] - Partnering for exceptional care. Office: [phone]

## 2018-06-06 NOTE — Progress Notes (Signed)
Surgical Clinic History and Physical  Referring provider:  [name], MD [address] [place],  [address]  HISTORY OF PRESENT ILLNESS (HPI):  62 y.o. male presents for evaluation of Left groin pain. Patient reports he first noticed his Left groin pain when he awoke to an episode of likely smoking-associated coughing, after which he noted a Left groin bulge, since which he has experienced Left groin pain and a bulge that he describes appears to be more prominent with heavy lifting, strenuous coughing, or during BM's, though he acknowledges only "very rare" constipation since he underwent recent colonoscopy. He likewise denies any straining with urination. He recalls that he also had his Right inguinal hernia repaired 12 years ago at [facility], but has over the past year been experiencing a "stabbing" Right groin pain that is worse with the same activities that exacerbate his Left groin pain. However, he has not noted any detectable Right groin bulge. Nonetheless, patient requests repair of B/L inguinal hernias if possible at the same time. Of note, patient's Left inguinal hernia was likewise appreciated and confirmed during recent primary care physician appointment. Patient says he is currently attempting to quit smoking.  PAST MEDICAL HISTORY (PMH):  Past Medical History:  Diagnosis Date  . Arthritis   . Asthma   . Constipation   . COPD (chronic obstructive pulmonary disease) ([place])   . Diabetes mellitus without complication (HCC)    Type 2  . GERD (gastroesophageal reflux disease)   . Hepatitis C   . History of kidney stones   . Hyperlipidemia   . Hypertension   . Restless legs   . Schizoaffective disorder ([place])    Pt denies  . Vitamin D deficiency     PAST SURGICAL HISTORY ([place]):  Past Surgical History:  Procedure Laterality Date  . BACK SURGERY    . COLONOSCOPY WITH PROPOFOL N/A 09/07/2017   Procedure: COLONOSCOPY WITH PROPOFOL;  Surgeon: Virgel Manifold,  MD;  Location: [facility] ENDOSCOPY;  Service: Endoscopy;  Laterality: N/A;  . ESOPHAGOGASTRODUODENOSCOPY (EGD) WITH PROPOFOL N/A 12/07/2017   Procedure: ESOPHAGOGASTRODUODENOSCOPY (EGD) WITH PROPOFOL;  Surgeon: Virgel Manifold, MD;  Location: [facility] ENDOSCOPY;  Service: Endoscopy;  Laterality: N/A;  . FLEXIBLE SIGMOIDOSCOPY N/A 11/09/2017   Procedure: FLEXIBLE SIGMOIDOSCOPY;  Surgeon: Virgel Manifold, MD;  Location: [facility] ENDOSCOPY;  Service: Endoscopy;  Laterality: N/A;  . HERNIA REPAIR     inguinal  . L3 TO L5 LAMINECTOMY FOR DECOMPRESSION  07/17/2016   [facility]  . REPAIR OF CEREBROSPINAL FLUID LEAK N/A 09/08/2016   Procedure: Lumbar wound exploration, repair of pseudomenigocele;  Surgeon: Kevan Ny Ditty, MD;  Location: [place];  Service: Neurosurgery;  Laterality: N/A;  Lumbar wound exploration, repair of pseudomenigocele  . SINUSOTOMY      MEDICATIONS:  Prior to Admission medications   Medication Sig Start Date End Date Taking? Authorizing Provider  gabapentin (NEURONTIN) 300 MG capsule Take 1 capsule (300 mg total) by mouth 3 (three) times daily. 02/19/18  Yes Sowles, Drue Stager, MD  hydrochlorothiazide (HYDRODIURIL) 12.5 MG tablet Take 1 tablet (12.5 mg total) by mouth daily. 05/29/18  Yes Sowles, Drue Stager, MD  LEVEMIR 100 UNIT/ML injection INJECT 5 UNITS SUBCUTANEOUSLY ONCE DAILY IN THE EVENING 03/19/18  Yes Sowles, Drue Stager, MD  lisinopril (PRINIVIL,ZESTRIL) 20 MG tablet Take 1 tablet (20 mg total) by mouth daily. 02/19/18  Yes Sowles, Drue Stager, MD  methocarbamol (ROBAXIN) 750 MG tablet TAKE 1 TABLET BY MOUTH EVERY 8 HOURS AS NEEDED FOR MUSCLE SPASM  04/09/18  Yes Sowles, Drue Stager, MD  pravastatin (PRAVACHOL) 20 MG tablet Take 1 tablet (20 mg total) by mouth daily. 02/19/18  Yes Sowles, Drue Stager, MD  Vitamin D, Ergocalciferol, (DRISDOL) 50000 units CAPS capsule Take 1 capsule (50,000 Units total) by mouth every 7 (seven) days. 02/19/18  Yes [name], MD      ALLERGIES:  Allergies  Allergen Reactions  . Aspirin Nausea Only and Other (See Comments)  . Penicillins Hives and Swelling    SWELLING REACTION UNSPECIFIED   Has patient had a PCN reaction causing immediate rash, facial/tongue/throat swelling, SOB or lightheadedness with hypotension: #  #  #  YES  #  #  #  Has patient had a PCN reaction causing severe rash involving mucus membranes or skin necrosis:  #  #  #  UNKNOWN  #  #  #   Has patient had a PCN reaction that required hospitalization:  #  #  #  UNKNOWN  #  #  #  Has patient had a PCN reaction occurring within the last 10 years:  #  #  #  NO  #  #  #   . Lyrica [Pregabalin] Other (See Comments)    intolerance    SOCIAL HISTORY:  Social History   Socioeconomic History  . Marital status: Divorced    Spouse name: Not on file  . Number of children: 2  . Years of education: Not on file  . Highest education level: 12th grade  Occupational History  . Occupation: Disabled  Social Needs  . Financial resource strain: Not hard at all  . Food insecurity:    Worry: Never true    Inability: Never true  . Transportation needs:    Medical: No    Non-medical: No  Tobacco Use  . Smoking status: Current Every Day Smoker    Packs/day: 1.50    Years: 46.00    Pack years: 69.00    Types: Cigarettes  . Smokeless tobacco: Never Used  Substance and Sexual Activity  . Alcohol use: No    Alcohol/week: 0.0 standard drinks  . Drug use: No  . Sexual activity: Not Currently  Lifestyle  . Physical activity:    Days per week: 1 day    Minutes per session: 20 min  . Stress: Not at all  Relationships  . Social connections:    Talks on phone: Three times a week    Gets together: Three times a week    Attends religious service: Never    Active member of club or organization: No    Attends meetings of clubs or organizations: Never    Relationship status: Divorced  . Intimate partner violence:    Fear of current or ex partner: No     Emotionally abused: No    Physically abused: No    Forced sexual activity: No  Other Topics Concern  . Not on file  Social History Narrative   Lives alone    The patient currently resides (home / rehab facility / nursing home): Home The patient normally is (ambulatory / bedbound): Ambulatory  FAMILY HISTORY:  Family History  Problem Relation Age of Onset  . Hypertension Mother   . Diverticulitis Mother   . Colitis Mother   . Diabetes Father   . Hypertension Father   . Heart disease Father   . Diverticulitis Brother   . Diabetes Sister     Otherwise negative/non-contributory.  REVIEW OF SYSTEMS:  Constitutional: denies any other  weight loss, fever, chills, or sweats  Eyes: denies any other vision changes, history of eye injury  ENT: denies sore throat, hearing problems  Respiratory: denies shortness of breath, wheezing  Cardiovascular: denies chest pain, palpitations  Gastrointestinal: abdominal pain, N/V, and bowel function as per HPI Musculoskeletal: denies any other joint pains or cramps  Skin: Denies any other rashes or skin discolorations Neurological: denies any other headache, dizziness, weakness  Psychiatric: Denies any other depression, anxiety   All other review of systems were otherwise negative   VITAL SIGNS:  BP (!) 189/92   Pulse 76   Temp 98.1 F (36.7 C) (Temporal)   Ht 5\' 11"  (1.803 m)   Wt 163 lb (73.9 kg)   BMI 22.73 kg/m   PHYSICAL EXAM:  Constitutional:  -- Normal body habitus  -- Awake, alert, and oriented x3  Eyes:  -- Pupils equally round and reactive to light  -- No scleral icterus  Ear, nose, throat:  -- Very poor dental hygiene -- No jugular venous distension -- No nasal drainage, bleeding Pulmonary:  -- No crackles  -- Equal breath sounds bilaterally -- Breathing non-labored at rest Cardiovascular:  -- S1, S2 present  -- No pericardial rubs  Gastrointestinal: -- Elongated tender to palpation Left groin bulge, unable to  appreciated Right groin bulge -- Abdomen otherwise soft, nontender, non-distended, no guarding/rebound  -- No other abdominal masses appreciated, pulsatile or otherwise  Musculoskeletal and Integumentary:  -- Wounds or skin discoloration: None appreciated -- Extremities: B/L UE and LE FROM, hands and feet warm, no edema  Neurologic:  -- Motor function: Intact and symmetric -- Sensation: Intact and symmetric  Labs:  CBC Latest Ref Rng & Units 02/19/2018 03/30/2017 12/15/2016  WBC 3.8 - 10.8 Thousand/uL 6.7 6.7 6.0  Hemoglobin 13.2 - 17.1 g/dL 15.7 15.7 14.9  Hematocrit 38.5 - 50.0 % 44.0 45.4 45.1  Platelets 140 - 400 Thousand/uL 156 163 154   CMP Latest Ref Rng & Units 02/19/2018 03/30/2017 12/15/2016  Glucose 65 - 139 mg/dL 116 175(H) 94  BUN 7 - 25 mg/dL 16 10 17   Creatinine 0.70 - 1.25 mg/dL 0.71 0.80 0.78  Sodium 135 - 146 mmol/L 140 138 139  Potassium 3.5 - 5.3 mmol/L 4.4 4.5 4.5  Chloride 98 - 110 mmol/L 103 101 102  CO2 20 - 32 mmol/L 31 28 28   Calcium 8.6 - 10.3 mg/dL 9.4 9.0 9.2  Total Protein 6.1 - 8.1 g/dL 6.5 6.2 6.4  Total Bilirubin 0.2 - 1.2 mg/dL 1.0 1.0 1.1  Alkaline Phos 40 - 115 U/L - 104 107  AST 10 - 35 U/L 14 12 21   ALT 9 - 46 U/L 14 11 26    Imaging studies: No new pertinent imaging studies available for review   Assessment/Plan:  62 y.o. male with increasingly symptomatic reducible Left inguinal hernia and possible likewise increasingly symptomatic recurrent Right inguinal hernia, complicated by pertinent comorbidities including DM, HTN, HLD, COPD, osteoarthritis, hepatitis C, and schizoaffective disorder.               - discussed with patient signs and symptoms of hernia incarceration and obstruction             - strategies for manual self-reduction of patient's inguinal hernia(s) also reviewed and discussed  - maintain hydration with high fiber heart healthy diet to reduce/minimize constipation +/- daily stool softener as needed             - all risks,  benefits, and  alternatives to laparoscopic repair of bilateral inguinal hernias with mesh were discussed with the patient, all of his questions were answered to patient's expressed satisfaction, patient expresses he wishes to proceed, and informed consent was obtained.             - will plan for laparoscopic repair of at least Left inguinal hernia hernia with mesh and possible laparoscopic repair of painful possibly recurrent Right inguinal hernia with mesh pending anesthesia and OR availability             - anticipate return to clinic 2 weeks after above planned surgery             - instructed to call if any questions or concerns  - smoking cessation strongly encouraged   All of the above recommendations were discussed with the patient family, and all of patient's questions were answered to his expressed satisfaction.  Thank you for the opportunity to participate in this patient's care.  -- Marilynne Drivers Rosana Hoes, MD, [place]: [facility] - Partnering for exceptional care. Office: [phone]

## 2018-06-06 NOTE — Patient Instructions (Addendum)
You have chose to have your hernia repaired. This will be done by Dr. Tama High  at [facility].    You will need to arrange to be out of work for 2 weeks and then return with a lifting restrictions for 4 more weeks. Please send any FMLA paperwork prior to surgery and we will fill this out and fax it back to your employer within 3 business days.  You may have a bruise in your groin and also swelling and brusing in your testicle area. You may use ice 4-5 times daily for 15-20 minutes each time. Make sure that you place a barrier between you and the ice pack. To decrease the swelling, you may roll up a bath towel and place it vertically in between your thighs with your testicles resting on the towel. You will want to keep this area elevated as much as possible for several days following surgery.    Inguinal Hernia, Adult Muscles help keep everything in the body in its proper place. But if a weak spot in the muscles develops, something can poke through. That is called a hernia. When this happens in the lower part of the belly (abdomen), it is called an inguinal hernia. (It takes its name from a part of the body in this region called the inguinal canal.) A weak spot in the wall of muscles lets some fat or part of the small intestine bulge through. An inguinal hernia can develop at any age. Men get them more often than women. CAUSES  In adults, an inguinal hernia develops over time.  It can be triggered by:  Suddenly straining the muscles of the lower abdomen.  Lifting heavy objects.  Straining to have a bowel movement. Difficult bowel movements (constipation) can lead to this.  Constant coughing. This may be caused by smoking or lung disease.  Being overweight.  Being pregnant.  Working at a job that requires long periods of standing or heavy lifting.  Having had an inguinal hernia before. One type can be an emergency situation. It is called a strangulated inguinal hernia. It develops if part  of the small intestine slips through the weak spot and cannot get back into the abdomen. The blood supply can be cut off. If that happens, part of the intestine may die. This situation requires emergency surgery. SYMPTOMS  Often, a small inguinal hernia has no symptoms. It is found when a healthcare provider does a physical exam. Larger hernias usually have symptoms.   In adults, symptoms may include:  A lump in the groin. This is easier to see when the person is standing. It might disappear when lying down.  In men, a lump in the scrotum.  Pain or burning in the groin. This occurs especially when lifting, straining or coughing.  A dull ache or feeling of pressure in the groin.  Signs of a strangulated hernia can include:  A bulge in the groin that becomes very painful and tender to the touch.  A bulge that turns red or purple.  Fever, nausea and vomiting.  Inability to have a bowel movement or to pass gas. DIAGNOSIS  To decide if you have an inguinal hernia, a healthcare provider will probably do a physical examination.  This will include asking questions about any symptoms you have noticed.  The healthcare provider might feel the groin area and ask you to cough. If an inguinal hernia is felt, the healthcare provider may try to slide it back into the abdomen.  Usually  no other tests are needed. TREATMENT  Treatments can vary. The size of the hernia makes a difference. Options include:  Watchful waiting. This is often suggested if the hernia is small and you have had no symptoms.  No medical procedure will be done unless symptoms develop.  You will need to watch closely for symptoms. If any occur, contact your healthcare provider right away.  Surgery. This is used if the hernia is larger or you have symptoms.  Open surgery. This is usually an outpatient procedure (you will not stay overnight in a hospital). An cut (incision) is made through the skin in the groin. The hernia  is put back inside the abdomen. The weak area in the muscles is then repaired by herniorrhaphy or hernioplasty. Herniorrhaphy: in this type of surgery, the weak muscles are sewn back together. Hernioplasty: a patch or mesh is used to close the weak area in the abdominal wall.  Laparoscopy. In this procedure, a surgeon makes small incisions. A thin tube with a tiny video camera (called a laparoscope) is put into the abdomen. The surgeon repairs the hernia with mesh by looking with the video camera and using two long instruments. HOME CARE INSTRUCTIONS   After surgery to repair an inguinal hernia:  You will need to take pain medicine prescribed by your healthcare provider. Follow all directions carefully.  You will need to take care of the wound from the incision.  Your activity will be restricted for awhile. This will probably include no heavy lifting for several weeks. You also should not do anything too active for a few weeks. When you can return to work will depend on the type of job that you have.  During "watchful waiting" periods, you should:  Maintain a healthy weight.  Eat a diet high in fiber (fruits, vegetables and whole grains).  Drink plenty of fluids to avoid constipation. This means drinking enough water and other liquids to keep your urine clear or pale yellow.  Do not lift heavy objects.  Do not stand for long periods of time.  Quit smoking. This should keep you from developing a frequent cough. SEEK MEDICAL CARE IF:   A bulge develops in your groin area.  You feel pain, a burning sensation or pressure in the groin. This might be worse if you are lifting or straining.  You develop a fever of more than 100.5 F (38.1 C). SEEK IMMEDIATE MEDICAL CARE IF:   Pain in the groin increases suddenly.  A bulge in the groin gets bigger suddenly and does not go down.  For men, there is sudden pain in the scrotum. Or, the size of the scrotum increases.  A bulge in the groin  area becomes red or purple and is painful to touch.  You have nausea or vomiting that does not go away.  You feel your heart beating much faster than normal.  You cannot have a bowel movement or pass gas.  You develop a fever of more than 102.0 F (38.9 C).   This information is not intended to replace advice given to you by your health care provider. Make sure you discuss any questions you have with your health care provider.   Document Released: 12/31/2008 Document Revised: 11/06/2011 Document Reviewed: 02/15/2015 Elsevier Interactive Patient Education 2016 Else[employer] Inc[employer]      Inguinal Hernia, Adult An inguinal hernia is when fat or the intestines push through the area where the leg meets the lower belly (groin) and make a rounded lump (  bulge). This condition happens over time. There are three types of inguinal hernias. These types include:  Hernias that can be pushed back into the belly (are reducible).  Hernias that cannot be pushed back into the belly (are incarcerated).  Hernias that cannot be pushed back into the belly and lose their blood supply (get strangulated). This type needs emergency surgery.  Follow these instructions at home: Lifestyle  Drink enough fluid to keep your urine (pee) clear or pale yellow.  Eat plenty of fruits, vegetables, and whole grains. These have a lot of fiber. Talk with your doctor if you have questions.  Avoid lifting heavy objects.  Avoid standing for long periods of time.  Do not use tobacco products. These include cigarettes, chewing tobacco, or e-cigarettes. If you need help quitting, ask your doctor.  Try to stay at a healthy weight. General instructions  Do not try to force the hernia back in.  Watch your hernia for any changes in color or size. Let your doctor know if there are any changes.  Take over-the-counter and prescription medicines only as told by your doctor.  Keep all follow-up visits as told by your doctor.  This is important. Contact a doctor if:  You have a fever.  You have new symptoms.  Your symptoms get worse. Get help right away if:  The area where the legs meets the lower belly has: ? Pain that gets worse suddenly. ? A bulge that gets bigger suddenly and does not go down. ? A bulge that turns red or purple. ? A bulge that is painful to the touch.  You are a man and your scrotum: ? Suddenly feels painful. ? Suddenly changes in size.  You feel sick to your stomach (nauseous) and this feeling does not go away.  You throw up (vomit) and this keeps happening.  You feel your heart beating a lot more quickly than normal.  You cannot poop (have a bowel movement) or pass gas. This information is not intended to replace advice given to you by your health care provider. Make sure you discuss any questions you have with your health care provider. Document Released: 09/14/2006 Document Revised: 01/20/2016 Document Reviewed: 06/24/2014 Elsevier Interactive Patient Education  2018 [employer].

## 2018-06-10 ENCOUNTER — Ambulatory Visit (INDEPENDENT_AMBULATORY_CARE_PROVIDER_SITE_OTHER): Payer: PPO | Admitting: Family Medicine

## 2018-06-10 ENCOUNTER — Telehealth: Payer: Self-pay | Admitting: *Deleted

## 2018-06-10 ENCOUNTER — Encounter: Payer: Self-pay | Admitting: Family Medicine

## 2018-06-10 VITALS — BP 130/78 | HR 77 | Temp 98.3°F | Resp 16 | Ht 71.0 in | Wt 161.9 lb

## 2018-06-10 DIAGNOSIS — Z794 Long term (current) use of insulin: Secondary | ICD-10-CM

## 2018-06-10 DIAGNOSIS — I10 Essential (primary) hypertension: Secondary | ICD-10-CM

## 2018-06-10 DIAGNOSIS — E118 Type 2 diabetes mellitus with unspecified complications: Secondary | ICD-10-CM | POA: Diagnosis not present

## 2018-06-10 DIAGNOSIS — Z01818 Encounter for other preprocedural examination: Secondary | ICD-10-CM

## 2018-06-10 DIAGNOSIS — K4020 Bilateral inguinal hernia, without obstruction or gangrene, not specified as recurrent: Secondary | ICD-10-CM | POA: Diagnosis not present

## 2018-06-10 HISTORY — DX: Essential (primary) hypertension: I10

## 2018-06-10 LAB — POCT GLYCOSYLATED HEMOGLOBIN (HGB A1C): Hemoglobin A1C: 6.8 % — AB (ref 4.0–5.6)

## 2018-06-10 NOTE — Telephone Encounter (Signed)
We have received medical clearance from Dr. Ancil Boozer. This has been forwarded to the Pre-admission Testing Department.  Surgery with Dr. Rosana Hoes as scheduled for 06-21-18 at [facility]. Patient notified.

## 2018-06-10 NOTE — Addendum Note (Signed)
Addended by: Priscille Heidelberg on: 06/10/2018 05:06 PM   Modules accepted: Orders, SmartSet

## 2018-06-10 NOTE — Progress Notes (Signed)
Name: Mark Nash   MRN: [id number]    DOB: 1956/07/26   Date:06/10/2018       Progress Note  Subjective  Chief Complaint  Chief Complaint  Patient presents with  . Medical Clearance    patient is scheduled for surgery on 06/21/18    HPI  DMII: he is down on 4 units of levemir daily, he does not like taking oral diabetic medication, he tries to be compliant with his diet, he states occasionally gets shaky but does not check his glucose, it resolves with a snack. No polyphagia, polydipsia but always drinks water.   HTN: his bp used to be at goal, but elevated last week and today, we just added HCTZ that he started taking 2 days ago. He states did not take lisinopril prior to his visit today. He denies chest pain , but has occasionally palpitation in am, resolves by itself.  He states occasionally gets SOB but he is a smoker and it does not affect his ability to walk fast for 20 minutes or go up a flight of stairs.   Inguinal hernia: right side is a recurrence, left is new, seen by Dr. Rosana Hoes and needs pre-op clearance. He is able to perform 4 METS of activity and is a low risk procedure. He has high risk medical problems such as DM, HTN that is currently uncontrolled, and PVD. He is compliant with his medications. Explained that he needs to try quitting smoking prior to surgery or cut down on cigarettes, no previous problem with anesthesia. Last EKG over one year ago and we will repeat it today   Patient Active Problem List   Diagnosis Date Noted  . CLE (columnar lined esophagus)   . Gastric irritation   . Duodenum ulcer   . Personal history of colonic polyps   . Encounter for screening colonoscopy   . Polyp of sigmoid colon   . Diverticulosis of large intestine without diverticulitis   . Benign neoplasm of ascending colon   . Osteoarthritis of both hands 09/04/2017  . Chronic pain of left knee 09/04/2017  . Abnormal MRI, knee 09/04/2017  . Postprocedural pseudomeningocele  09/08/2016  . Lymphedema 07/07/2016  . Chronic hepatitis C without hepatic coma ([place]) 06/01/2016  . Lumbar herniated disc 11/24/2015  . Protein malnutrition ([place]) 11/11/2015  . Thrombocytopenia ([place]) 11/11/2015  . Hepatitis C antibody test positive 07/13/2015  . Chronic constipation 07/13/2015  . Abnormal immunological findings in specimens from other organs, systems and tissues 07/13/2015  . CN (constipation) 07/13/2015  . Benign hypertension 03/08/2015  . COPD, mild ([place]) 03/08/2015  . Dyslipidemia 03/08/2015  . Paranoid schizophrenia ([place]) 03/08/2015  . Acquired polyneuropathy 03/08/2015  . Restless leg 03/08/2015  . Tobacco abuse 03/08/2015  . Callus of foot 03/08/2015  . Claudication ([place]) 03/08/2015  . Chronic obstructive pulmonary disease ([place]) 03/08/2015  . Corn or callus 03/08/2015  . Essential (primary) hypertension 03/08/2015  . HLD (hyperlipidemia) 03/08/2015  . Peripheral vascular disease ([place]) 03/08/2015  . Polyneuropathy 03/08/2015  . Current tobacco use 03/08/2015  . Vitamin D deficiency 04/26/2009  . Avitaminosis D 04/26/2009    Past Surgical History:  Procedure Laterality Date  . BACK SURGERY    . COLONOSCOPY WITH PROPOFOL N/A 09/07/2017   Procedure: COLONOSCOPY WITH PROPOFOL;  Surgeon: Virgel Manifold, MD;  Location: [facility] ENDOSCOPY;  Service: Endoscopy;  Laterality: N/A;  . ESOPHAGOGASTRODUODENOSCOPY (EGD) WITH PROPOFOL N/A 12/07/2017   Procedure: ESOPHAGOGASTRODUODENOSCOPY (EGD) WITH PROPOFOL;  Surgeon: Virgel Manifold, MD;  Location: [facility] ENDOSCOPY;  Service: Endoscopy;  Laterality: N/A;  . FLEXIBLE SIGMOIDOSCOPY N/A 11/09/2017   Procedure: FLEXIBLE SIGMOIDOSCOPY;  Surgeon: Virgel Manifold, MD;  Location: [facility] ENDOSCOPY;  Service: Endoscopy;  Laterality: N/A;  . HERNIA REPAIR     inguinal  . L3 TO L5 LAMINECTOMY FOR DECOMPRESSION  07/17/2016   [facility]  . REPAIR OF CEREBROSPINAL FLUID LEAK N/A 09/08/2016   Procedure:  Lumbar wound exploration, repair of pseudomenigocele;  Surgeon: Kevan Ny Ditty, MD;  Location: [facility];  Service: Neurosurgery;  Laterality: N/A;  Lumbar wound exploration, repair of pseudomenigocele  . SINUSOTOMY      Family History  Problem Relation Age of Onset  . Hypertension Mother   . Diverticulitis Mother   . Colitis Mother   . Diabetes Father   . Hypertension Father   . Heart disease Father   . Diverticulitis Brother   . Diabetes Sister     Social History   Socioeconomic History  . Marital status: Divorced    Spouse name: Not on file  . Number of children: 2  . Years of education: Not on file  . Highest education level: 12th grade  Occupational History  . Occupation: Disabled  Social Needs  . Financial resource strain: Not hard at all  . Food insecurity:    Worry: Never true    Inability: Never true  . Transportation needs:    Medical: No    Non-medical: No  Tobacco Use  . Smoking status: Current Every Day Smoker    Packs/day: 1.50    Years: 46.00    Pack years: 69.00    Types: Cigarettes  . Smokeless tobacco: Never Used  Substance and Sexual Activity  . Alcohol use: No    Alcohol/week: 0.0 standard drinks  . Drug use: No  . Sexual activity: Not Currently  Lifestyle  . Physical activity:    Days per week: 1 day    Minutes per session: 20 min  . Stress: Not at all  Relationships  . Social connections:    Talks on phone: Three times a week    Gets together: Three times a week    Attends religious service: Never    Active member of club or organization: No    Attends meetings of clubs or organizations: Never    Relationship status: Divorced  . Intimate partner violence:    Fear of current or ex partner: No    Emotionally abused: No    Physically abused: No    Forced sexual activity: No  Other Topics Concern  . Not on file  Social History Narrative   Lives alone     Current Outpatient Medications:  .  gabapentin (NEURONTIN) 300 MG  capsule, Take 1 capsule (300 mg total) by mouth 3 (three) times daily., Disp: 270 capsule, Rfl: 0 .  hydrochlorothiazide (HYDRODIURIL) 12.5 MG tablet, Take 1 tablet (12.5 mg total) by mouth daily., Disp: 30 tablet, Rfl: 0 .  LEVEMIR 100 UNIT/ML injection, INJECT 5 UNITS SUBCUTANEOUSLY ONCE DAILY IN THE EVENING (Patient taking differently: Inject 4 Units into the skin daily at 3 pm. ), Disp: 10 mL, Rfl: 0 .  lisinopril (PRINIVIL,ZESTRIL) 20 MG tablet, Take 1 tablet (20 mg total) by mouth daily., Disp: 90 tablet, Rfl: 1 .  meloxicam (MOBIC) 7.5 MG tablet, Take 7.5 mg by mouth daily., Disp: , Rfl:  .  methocarbamol (ROBAXIN) 750 MG tablet, TAKE 1 TABLET BY MOUTH EVERY 8 HOURS AS NEEDED FOR MUSCLE  SPASM (Patient taking differently: Take 750 mg by mouth every 8 (eight) hours as needed for muscle spasms. ), Disp: 90 tablet, Rfl: 2 .  pravastatin (PRAVACHOL) 20 MG tablet, Take 1 tablet (20 mg total) by mouth daily. (Patient taking differently: Take 20 mg by mouth daily with supper. ), Disp: 90 tablet, Rfl: 1 .  Vitamin D, Ergocalciferol, (DRISDOL) 50000 units CAPS capsule, Take 1 capsule (50,000 Units total) by mouth every 7 (seven) days. (Patient taking differently: Take 50,000 Units by mouth every 14 (fourteen) days. ), Disp: 12 capsule, Rfl: 1  Allergies  Allergen Reactions  . Penicillins Hives and Swelling    As a child. Has patient had a PCN reaction causing immediate rash, facial/tongue/throat swelling, SOB or lightheadedness with hypotension: YES  Has patient had a PCN reaction causing severe rash involving mucus membranes or skin necrosis: UNKNOWN Has patient had a PCN reaction that required hospitalization: UNKNOWN Has patient had a PCN reaction occurring within the last 10 years: NO  . Aspirin Nausea Only  . Lyrica [Pregabalin] Nausea Only    I personally reviewed active problem list, medication list, allergies, family history, social history with the patient/caregiver  today.   ROS  Constitutional: Negative for fever or weight change.  Respiratory: Negative for cough and shortness of breath.   Cardiovascular: Negative for chest pain or palpitations.  Gastrointestinal: Negative for abdominal pain, no bowel changes.  Musculoskeletal: Negative for gait problem or joint swelling.  Skin: Negative for rash.  Neurological: Negative for dizziness or headache.  No other specific complaints in a complete review of systems (except as listed in HPI above).  Objective  Vitals:   06/10/18 1012 06/10/18 1109  BP: (!) 162/88 130/78  Pulse: 77   Resp: 16   Temp: 98.3 F (36.8 C)   TempSrc: Oral   SpO2: 97%   Weight: 161 lb 14.4 oz (73.4 kg)   Height: 5\' 11"  (1.803 m)     Body mass index is 22.58 kg/m.  Physical Exam  Constitutional: Patient appears well-developed and well-nourished. No distress.  HEENT: head atraumatic, normocephalic, pupils equal and reactive to light, neck supple, throat within normal limits Cardiovascular: Normal rate, regular rhythm and normal heart sounds.  No murmur heard. No BLE edema. Signs of PVD with erythema and also no hair  Pulmonary/Chest: Effort normal and breath sounds normal. No respiratory distress. Abdominal: Soft.  There is no tenderness. Did not repeat inguinal exam done last week  Psychiatric: Patient has a normal mood and affect. behavior is normal. Judgment and thought content normal.   PHQ2/9: Depression screen [facility] 2/9 06/10/2018 05/29/2018 02/19/2018 11/30/2017 10/22/2017  Decreased Interest 0 0 0 0 0  Down, Depressed, Hopeless 0 0 0 0 0  PHQ - 2 Score 0 0 0 0 0  Altered sleeping 0 0 0 0 -  Tired, decreased energy 0 0 0 0 -  Change in appetite 0 0 0 0 -  Feeling bad or failure about yourself  0 0 0 0 -  Trouble concentrating 0 0 0 0 -  Moving slowly or fidgety/restless 0 0 0 0 -  Suicidal thoughts 0 0 0 0 -  PHQ-9 Score 0 0 - 0 -  Difficult doing work/chores - - Not difficult at all Not difficult at all -      Fall Risk: Fall Risk  06/10/2018 02/19/2018 11/30/2017 10/22/2017 07/17/2017  Falls in the past year? No No No No No  Risk for fall due to : - -  Impaired vision - -     Functional Status Survey: Is the patient deaf or have difficulty hearing?: No Does the patient have difficulty seeing, even when wearing glasses/contacts?: No Does the patient have difficulty concentrating, remembering, or making decisions?: No Does the patient have difficulty walking or climbing stairs?: Yes(due to the groin pain) Does the patient have difficulty dressing or bathing?: No Does the patient have difficulty doing errands alone such as visiting a doctor's office or shopping?: No   Assessment & Plan  1. Bilateral inguinal hernia without obstruction or gangrene, recurrence not specified  He will have surgery done by Dr. Rosana Hoes on 06/21/2018  2. Type 2 diabetes mellitus with complication, with long-term current use of insulin (HCC)  - POCT HgB A1C -6.8% - at goal   3. Pre-op evaluation  -EKG - normal low risk procedure with moderate risk patient, proceed without further tests  4. Hypertension Benign   He took lisinopril once he got to the office and explained bp should be at goal by the time of surgery, talk to anesthesiologist about taking medication the morning of procedure

## 2018-06-14 ENCOUNTER — Encounter
Admission: RE | Admit: 2018-06-14 | Discharge: 2018-06-14 | Disposition: A | Discharge status: Home / Self Care | Payer: PPO | Source: Ambulatory Visit | Attending: Surgery | Admitting: Surgery

## 2018-06-14 ENCOUNTER — Other Ambulatory Visit: Payer: Self-pay

## 2018-06-14 HISTORY — DX: Dyspnea, unspecified: R06.00

## 2018-06-14 NOTE — Patient Instructions (Signed)
Your procedure is scheduled on: 06-21-18 FRIDAY Report to Same Day Surgery 2nd floor medical mall [facility] Entrance-take elevator on left to 2nd floor.  Check in with surgery information desk.) To find out your arrival time please call [phone] between 1PM - 3PM on 06-20-18 THURSDAY  Remember: Instructions that are not followed completely may result in serious medical risk, up to and including death, or upon the discretion of your surgeon and anesthesiologist your surgery may need to be rescheduled.    _x___ 1. Do not eat food after midnight the night before your procedure. NO GUM OR CANDY AFTER MIDNIGHT.  You may drink WATER up to 2 hours before you are scheduled to arrive at the hospital for your procedure.  Do not drink WATER within 2 hours of your scheduled arrival to the hospital.  Type 1 and type 2 diabetics should only drink water.   ____Ensure clear carbohydrate drink on the way to the hospital for bariatric patients  ____Ensure clear carbohydrate drink 3 hours before surgery for Dr Dwyane Luo patients if physician instructed.     __x__ 2. No Alcohol for 24 hours before or after surgery.   __x__3. No Smoking or e-cigarettes for 24 prior to surgery.  Do not use any chewable tobacco products for at least 6 hour prior to surgery   ____  4. Bring all medications with you on the day of surgery if instructed.    __x__ 5. Notify your doctor if there is any change in your medical condition     (cold, fever, infections).    x___6. On the morning of surgery brush your teeth with toothpaste and water.  You may rinse your mouth with mouth wash if you wish.  Do not swallow any toothpaste or mouthwash.   Do not wear jewelry, make-up, hairpins, clips or nail polish.  Do not wear lotions, powders, or perfumes. You may wear deodorant.  Do not shave 48 hours prior to surgery. Men may shave face and neck.  Do not bring valuables to the hospital.    [facility] is not responsible for  any belongings or valuables.               Contacts, dentures or bridgework may not be worn into surgery.  Leave your suitcase in the car. After surgery it may be brought to your room.  For patients admitted to the hospital, discharge time is determined by your treatment team.  _  Patients discharged the day of surgery will not be allowed to drive home.  You will need someone to drive you home and stay with you the night of your procedure.    Please read over the following fact sheets that you were given:   [facility] Preparing for Surgery    _x___ TAKE THE FOLLOWING MEDICATION THE MORNING OF SURGERY WITH A SMALL SIP OF WATER. These include:  1. GABAPENTIN (NEURONTIN)  2. ROBAXIN (METHOCARBAMOL)  3.  4.  5.  6.  ____Fleets enema or Magnesium Citrate as directed.   _x___ Use CHG Soap or sage wipes as directed on instruction sheet   ____ Use inhalers on the day of surgery and bring to hospital day of surgery  ____ Stop Metformin and Janumet 2 days prior to surgery.    _X___ Take 1/2 of usual insulin dose the night before surgery and none on the morning surgery-TAKE 2 UNITS OF LEVEMIR THE NIGHT BEFORE YOUR SURGERY  ____ Follow recommendations from Cardiologist, Pulmonologist or PCP regarding  stopping Aspirin, Coumadin, Plavix ,Eliquis, Effient, or Pradaxa, and Pletal.  X____Stop Anti-inflammatories such as Advil, Aleve, Ibuprofen, Motrin, Naproxen, MELOXICAM, Naprosyn, Goodies powders or aspirin products NOW-OK to take Tylenol    ____ Stop supplements until after surgery.    ____ Bring C-Pap to the hospital.

## 2018-06-19 ENCOUNTER — Encounter
Admission: RE | Admit: 2018-06-19 | Discharge: 2018-06-19 | Disposition: A | Discharge status: Home / Self Care | Payer: PPO | Source: Ambulatory Visit | Attending: Surgery | Admitting: Surgery

## 2018-06-19 DIAGNOSIS — K4090 Unilateral inguinal hernia, without obstruction or gangrene, not specified as recurrent: Secondary | ICD-10-CM

## 2018-06-19 DIAGNOSIS — Z79899 Other long term (current) drug therapy: Secondary | ICD-10-CM | POA: Diagnosis not present

## 2018-06-19 DIAGNOSIS — Z794 Long term (current) use of insulin: Secondary | ICD-10-CM | POA: Diagnosis not present

## 2018-06-19 DIAGNOSIS — E119 Type 2 diabetes mellitus without complications: Secondary | ICD-10-CM | POA: Diagnosis not present

## 2018-06-19 DIAGNOSIS — M1990 Unspecified osteoarthritis, unspecified site: Secondary | ICD-10-CM | POA: Diagnosis not present

## 2018-06-19 DIAGNOSIS — E785 Hyperlipidemia, unspecified: Secondary | ICD-10-CM | POA: Diagnosis not present

## 2018-06-19 DIAGNOSIS — K219 Gastro-esophageal reflux disease without esophagitis: Secondary | ICD-10-CM | POA: Diagnosis not present

## 2018-06-19 DIAGNOSIS — F259 Schizoaffective disorder, unspecified: Secondary | ICD-10-CM | POA: Diagnosis not present

## 2018-06-19 DIAGNOSIS — Z01812 Encounter for preprocedural laboratory examination: Secondary | ICD-10-CM | POA: Insufficient documentation

## 2018-06-19 DIAGNOSIS — J449 Chronic obstructive pulmonary disease, unspecified: Secondary | ICD-10-CM | POA: Diagnosis not present

## 2018-06-19 DIAGNOSIS — I10 Essential (primary) hypertension: Secondary | ICD-10-CM | POA: Diagnosis not present

## 2018-06-19 DIAGNOSIS — F17210 Nicotine dependence, cigarettes, uncomplicated: Secondary | ICD-10-CM | POA: Diagnosis not present

## 2018-06-19 DIAGNOSIS — E559 Vitamin D deficiency, unspecified: Secondary | ICD-10-CM | POA: Diagnosis not present

## 2018-06-19 DIAGNOSIS — G2581 Restless legs syndrome: Secondary | ICD-10-CM | POA: Diagnosis not present

## 2018-06-19 LAB — CBC WITH DIFFERENTIAL/PLATELET
ABS IMMATURE GRANULOCYTES: 0.02 10*3/uL (ref 0.00–0.07)
BASOS PCT: 0 %
Basophils Absolute: 0 10*3/uL (ref 0.0–0.1)
Eosinophils Absolute: 0.2 10*3/uL (ref 0.0–0.5)
Eosinophils Relative: 2 %
HEMATOCRIT: 44.8 % (ref 39.0–52.0)
Hemoglobin: 15.4 g/dL (ref 13.0–17.0)
IMMATURE GRANULOCYTES: 0 %
Lymphocytes Relative: 29 %
Lymphs Abs: 2.2 10*3/uL (ref 0.7–4.0)
MCH: 31.2 pg (ref 26.0–34.0)
MCHC: 34.4 g/dL (ref 30.0–36.0)
MCV: 90.7 fL (ref 80.0–100.0)
MONOS PCT: 8 %
Monocytes Absolute: 0.6 10*3/uL (ref 0.1–1.0)
NEUTROS ABS: 4.6 10*3/uL (ref 1.7–7.7)
Neutrophils Relative %: 61 %
PLATELETS: 169 10*3/uL (ref 150–400)
RBC: 4.94 MIL/uL (ref 4.22–5.81)
RDW: 12 % (ref 11.5–15.5)
WBC: 7.6 10*3/uL (ref 4.0–10.5)
nRBC: 0 % (ref 0.0–0.2)

## 2018-06-19 LAB — BASIC METABOLIC PANEL
ANION GAP: 8 (ref 5–15)
BUN: 22 mg/dL (ref 8–23)
CO2: 30 mmol/L (ref 22–32)
Calcium: 9 mg/dL (ref 8.9–10.3)
Chloride: 101 mmol/L (ref 98–111)
Creatinine, Ser: 0.81 mg/dL (ref 0.61–1.24)
GFR calc Af Amer: >60 mL/min (ref >60)
GLUCOSE: 254 mg/dL — AB (ref 70–99)
POTASSIUM: 4.1 mmol/L (ref 3.5–5.1)
Sodium: 139 mmol/L (ref 135–145)

## 2018-06-20 ENCOUNTER — Encounter: Payer: Self-pay | Admitting: *Deleted

## 2018-06-20 ENCOUNTER — Telehealth: Payer: Self-pay | Admitting: *Deleted

## 2018-06-20 MED ORDER — VANCOMYCIN HCL IN DEXTROSE 1-5 GM/200ML-% IV SOLN
1000.0000 mg | INTRAVENOUS | Status: AC
  Administered 2018-06-21: 1000 mg via INTRAVENOUS

## 2018-06-20 NOTE — Telephone Encounter (Signed)
Patient called the office to report that he has been having back pain and wants to report if this will have any affect on hernia surgery scheduled with Dr. Rosana Hoes for tomorrow, 06-21-18.  The patient states that he is most comfortable when lying down. He reports that his is able to walk and that this is not the first time it has happened so he knows what to do. Patient states he has been using a heating pad as needed.   Mark Nash confirmed that this should not have any affect on surgery and we can proceed tomorrow as scheduled.   Patient notified and he verbalizes understanding.   The patient had a question about the antiseptic wash he is supposed to use prior to surgery. Patient was instructed to call the Pre-admission Testing at [phone].

## 2018-06-20 NOTE — Pre-Procedure Instructions (Cosign Needed)
MEDICAL CLEARANCE ON CHART FROM DR Ancil Boozer. MEDIUM RISK

## 2018-06-21 ENCOUNTER — Other Ambulatory Visit: Payer: Self-pay

## 2018-06-21 ENCOUNTER — Ambulatory Visit: Payer: PPO | Admitting: Certified Registered Nurse Anesthetist

## 2018-06-21 ENCOUNTER — Ambulatory Visit
Admission: RE | Admit: 2018-06-21 | Discharge: 2018-06-21 | Disposition: A | Discharge status: Home / Self Care | Payer: PPO | Source: Ambulatory Visit | Attending: Surgery | Admitting: Surgery

## 2018-06-21 ENCOUNTER — Encounter
Admission: RE | Disposition: A | Discharge status: Home / Self Care | Payer: Self-pay | Source: Ambulatory Visit | Attending: Surgery

## 2018-06-21 ENCOUNTER — Ambulatory Visit: Payer: PPO | Admitting: Family Medicine

## 2018-06-21 DIAGNOSIS — I10 Essential (primary) hypertension: Secondary | ICD-10-CM | POA: Insufficient documentation

## 2018-06-21 DIAGNOSIS — K4090 Unilateral inguinal hernia, without obstruction or gangrene, not specified as recurrent: Secondary | ICD-10-CM | POA: Insufficient documentation

## 2018-06-21 DIAGNOSIS — E785 Hyperlipidemia, unspecified: Secondary | ICD-10-CM | POA: Diagnosis not present

## 2018-06-21 DIAGNOSIS — J449 Chronic obstructive pulmonary disease, unspecified: Secondary | ICD-10-CM | POA: Diagnosis not present

## 2018-06-21 DIAGNOSIS — K4020 Bilateral inguinal hernia, without obstruction or gangrene, not specified as recurrent: Secondary | ICD-10-CM | POA: Diagnosis not present

## 2018-06-21 DIAGNOSIS — E119 Type 2 diabetes mellitus without complications: Secondary | ICD-10-CM | POA: Insufficient documentation

## 2018-06-21 DIAGNOSIS — Z794 Long term (current) use of insulin: Secondary | ICD-10-CM | POA: Insufficient documentation

## 2018-06-21 DIAGNOSIS — G2581 Restless legs syndrome: Secondary | ICD-10-CM | POA: Insufficient documentation

## 2018-06-21 DIAGNOSIS — F259 Schizoaffective disorder, unspecified: Secondary | ICD-10-CM | POA: Insufficient documentation

## 2018-06-21 DIAGNOSIS — Z79899 Other long term (current) drug therapy: Secondary | ICD-10-CM | POA: Insufficient documentation

## 2018-06-21 DIAGNOSIS — M1990 Unspecified osteoarthritis, unspecified site: Secondary | ICD-10-CM | POA: Insufficient documentation

## 2018-06-21 DIAGNOSIS — K219 Gastro-esophageal reflux disease without esophagitis: Secondary | ICD-10-CM | POA: Insufficient documentation

## 2018-06-21 DIAGNOSIS — E559 Vitamin D deficiency, unspecified: Secondary | ICD-10-CM | POA: Insufficient documentation

## 2018-06-21 DIAGNOSIS — E1151 Type 2 diabetes mellitus with diabetic peripheral angiopathy without gangrene: Secondary | ICD-10-CM | POA: Diagnosis not present

## 2018-06-21 DIAGNOSIS — F17210 Nicotine dependence, cigarettes, uncomplicated: Secondary | ICD-10-CM | POA: Insufficient documentation

## 2018-06-21 HISTORY — PX: INGUINAL HERNIA REPAIR: SHX194

## 2018-06-21 HISTORY — DX: Benign neoplasm of ascending colon: D12.2

## 2018-06-21 HISTORY — DX: Peripheral vascular disease, unspecified: I73.9

## 2018-06-21 HISTORY — DX: Thrombocytopenia, unspecified: D69.6

## 2018-06-21 HISTORY — DX: Diverticulosis of intestine, part unspecified, without perforation or abscess without bleeding: K57.90

## 2018-06-21 LAB — GLUCOSE, CAPILLARY
GLUCOSE-CAPILLARY: 117 mg/dL — AB (ref 70–99)
GLUCOSE-CAPILLARY: 125 mg/dL — AB (ref 70–99)

## 2018-06-21 SURGERY — REPAIR, HERNIA, INGUINAL, BILATERAL, LAPAROSCOPIC
Anesthesia: General | Site: Abdomen | Laterality: Bilateral

## 2018-06-21 MED ORDER — OXYCODONE HCL 5 MG PO TABS
ORAL_TABLET | ORAL | Status: AC
  Filled 2018-06-21: qty 1

## 2018-06-21 MED ORDER — FAMOTIDINE 20 MG PO TABS
ORAL_TABLET | ORAL | Status: AC
  Administered 2018-06-21: 20 mg via ORAL
  Filled 2018-06-21: qty 1

## 2018-06-21 MED ORDER — ONDANSETRON HCL 4 MG/2ML IJ SOLN
INTRAMUSCULAR | Status: AC
  Filled 2018-06-21: qty 2

## 2018-06-21 MED ORDER — VANCOMYCIN HCL IN DEXTROSE 1-5 GM/200ML-% IV SOLN
INTRAVENOUS | Status: AC
  Administered 2018-06-21: 1000 mg via INTRAVENOUS
  Filled 2018-06-21: qty 200

## 2018-06-21 MED ORDER — OXYCODONE HCL 5 MG PO TABS: 5.0000 mg | ORAL_TABLET | ORAL | 0 refills | Status: DC | PRN

## 2018-06-21 MED ORDER — PROMETHAZINE HCL 25 MG/ML IJ SOLN
INTRAMUSCULAR | Status: AC
  Administered 2018-06-21: 12.5 mg via INTRAVENOUS
  Filled 2018-06-21: qty 1

## 2018-06-21 MED ORDER — LIDOCAINE HCL (CARDIAC) PF 100 MG/5ML IV SOSY
PREFILLED_SYRINGE | INTRAVENOUS | Status: DC | PRN
  Administered 2018-06-21: 100 mg via INTRAVENOUS

## 2018-06-21 MED ORDER — ROCURONIUM BROMIDE 100 MG/10ML IV SOLN
INTRAVENOUS | Status: DC | PRN
  Administered 2018-06-21: 30 mg via INTRAVENOUS
  Administered 2018-06-21: 5 mg via INTRAVENOUS
  Administered 2018-06-21: 50 mg via INTRAVENOUS

## 2018-06-21 MED ORDER — LIDOCAINE HCL (PF) 1 % IJ SOLN
INTRAMUSCULAR | Status: AC
  Filled 2018-06-21: qty 30

## 2018-06-21 MED ORDER — CHLORHEXIDINE GLUCONATE CLOTH 2 % EX PADS: 6.0000 | MEDICATED_PAD | Freq: Once | CUTANEOUS | Status: DC

## 2018-06-21 MED ORDER — PROPOFOL 10 MG/ML IV BOLUS
INTRAVENOUS | Status: DC | PRN
  Administered 2018-06-21: 140 mg via INTRAVENOUS
  Administered 2018-06-21: 40 mg via INTRAVENOUS
  Administered 2018-06-21: 20 mg via INTRAVENOUS

## 2018-06-21 MED ORDER — SUGAMMADEX SODIUM 200 MG/2ML IV SOLN
INTRAVENOUS | Status: AC
  Filled 2018-06-21: qty 2

## 2018-06-21 MED ORDER — SUCCINYLCHOLINE CHLORIDE 20 MG/ML IJ SOLN
INTRAMUSCULAR | Status: AC
  Filled 2018-06-21: qty 1

## 2018-06-21 MED ORDER — FENTANYL CITRATE (PF) 100 MCG/2ML IJ SOLN
INTRAMUSCULAR | Status: DC | PRN
  Administered 2018-06-21 (×4): 50 ug via INTRAVENOUS

## 2018-06-21 MED ORDER — OXYCODONE HCL 5 MG/5ML PO SOLN: 5.0000 mg | Freq: Once | ORAL | Status: AC | PRN

## 2018-06-21 MED ORDER — OXYCODONE HCL 5 MG PO TABS
5.0000 mg | ORAL_TABLET | Freq: Once | ORAL | Status: AC | PRN
  Administered 2018-06-21: 5 mg via ORAL

## 2018-06-21 MED ORDER — PROPOFOL 10 MG/ML IV BOLUS
INTRAVENOUS | Status: AC
  Filled 2018-06-21: qty 40

## 2018-06-21 MED ORDER — ACETAMINOPHEN 500 MG PO TABS: 1000.0000 mg | ORAL_TABLET | ORAL | Status: DC

## 2018-06-21 MED ORDER — FENTANYL CITRATE (PF) 100 MCG/2ML IJ SOLN
INTRAMUSCULAR | Status: AC
  Filled 2018-06-21: qty 2

## 2018-06-21 MED ORDER — ONDANSETRON HCL 4 MG/2ML IJ SOLN
INTRAMUSCULAR | Status: DC | PRN
  Administered 2018-06-21: 4 mg via INTRAVENOUS

## 2018-06-21 MED ORDER — ACETAMINOPHEN 500 MG PO TABS
ORAL_TABLET | ORAL | Status: AC
  Filled 2018-06-21: qty 2

## 2018-06-21 MED ORDER — SODIUM CHLORIDE 0.9 % IV SOLN
INTRAVENOUS | Status: DC
  Administered 2018-06-21: 06:00:00 via INTRAVENOUS

## 2018-06-21 MED ORDER — MIDAZOLAM HCL 2 MG/2ML IJ SOLN
INTRAMUSCULAR | Status: DC | PRN
  Administered 2018-06-21: 2 mg via INTRAVENOUS

## 2018-06-21 MED ORDER — LIDOCAINE HCL 1 % IJ SOLN
INTRAMUSCULAR | Status: DC | PRN
  Administered 2018-06-21: 20 mL via INTRAMUSCULAR

## 2018-06-21 MED ORDER — MIDAZOLAM HCL 2 MG/2ML IJ SOLN
INTRAMUSCULAR | Status: AC
  Filled 2018-06-21: qty 2

## 2018-06-21 MED ORDER — FENTANYL CITRATE (PF) 100 MCG/2ML IJ SOLN
25.0000 ug | INTRAMUSCULAR | Status: DC | PRN
  Administered 2018-06-21: 50 ug via INTRAVENOUS

## 2018-06-21 MED ORDER — MEPERIDINE HCL 50 MG/ML IJ SOLN: 6.2500 mg | INTRAMUSCULAR | Status: DC | PRN

## 2018-06-21 MED ORDER — LIDOCAINE HCL (PF) 2 % IJ SOLN
INTRAMUSCULAR | Status: AC
  Filled 2018-06-21: qty 10

## 2018-06-21 MED ORDER — PROMETHAZINE HCL 25 MG/ML IJ SOLN
6.2500 mg | INTRAMUSCULAR | Status: DC | PRN
  Administered 2018-06-21: 12.5 mg via INTRAVENOUS

## 2018-06-21 MED ORDER — GABAPENTIN 300 MG PO CAPS
300.0000 mg | ORAL_CAPSULE | ORAL | Status: AC
  Administered 2018-06-21: 300 mg via ORAL

## 2018-06-21 MED ORDER — FAMOTIDINE 20 MG PO TABS
20.0000 mg | ORAL_TABLET | Freq: Once | ORAL | Status: AC
  Administered 2018-06-21: 20 mg via ORAL

## 2018-06-21 MED ORDER — ROCURONIUM BROMIDE 50 MG/5ML IV SOLN
INTRAVENOUS | Status: AC
  Filled 2018-06-21: qty 1

## 2018-06-21 MED ORDER — SUGAMMADEX SODIUM 200 MG/2ML IV SOLN
INTRAVENOUS | Status: DC | PRN
  Administered 2018-06-21: 142 mg via INTRAVENOUS

## 2018-06-21 MED ORDER — BUPIVACAINE HCL (PF) 0.5 % IJ SOLN
INTRAMUSCULAR | Status: AC
  Filled 2018-06-21: qty 30

## 2018-06-21 SURGICAL SUPPLY — 43 items
ADHESIVE MASTISOL STRL (MISCELLANEOUS) ×6 IMPLANT
BLADE CLIPPER SURG (BLADE) ×3 IMPLANT
BLADE SURG SZ11 CARB STEEL (BLADE) ×3 IMPLANT
CHLORAPREP W/TINT 26ML (MISCELLANEOUS) ×3 IMPLANT
CLOSURE WOUND 1/2 X4 (GAUZE/BANDAGES/DRESSINGS) ×1
COVER WAND RF STERILE (DRAPES) ×3 IMPLANT
DERMABOND ADVANCED (GAUZE/BANDAGES/DRESSINGS) ×2
DERMABOND ADVANCED .7 DNX12 (GAUZE/BANDAGES/DRESSINGS) ×1 IMPLANT
DEVICE SECURE STRAP 25 ABSORB (INSTRUMENTS) ×3 IMPLANT
DISSECT BALLN SPACEMKR OVL PDB (BALLOONS) ×3
DISSECT BALLN SPACEMKR RND PDB (MISCELLANEOUS) ×6
DISSECTOR BALLN SPCMKR OVL PDB (BALLOONS) ×1 IMPLANT
DISSECTOR BALLN SPCMKR RND PDB (MISCELLANEOUS) ×2 IMPLANT
ELECT REM PT RETURN 9FT ADLT (ELECTROSURGICAL) ×3
ELECTRODE REM PT RTRN 9FT ADLT (ELECTROSURGICAL) ×1 IMPLANT
GOWN STRL REUS W/ TWL LRG LVL3 (GOWN DISPOSABLE) ×1 IMPLANT
GOWN STRL REUS W/TWL LRG LVL3 (GOWN DISPOSABLE) ×2
GRASPER SUT TROCAR 14GX15 (MISCELLANEOUS) ×3 IMPLANT
IRRIGATION STRYKERFLOW (MISCELLANEOUS) IMPLANT
IRRIGATOR STRYKERFLOW (MISCELLANEOUS)
KITTNER LAPARASCOPIC 5X40 (MISCELLANEOUS) ×6 IMPLANT
LABEL OR SOLS (LABEL) ×3 IMPLANT
MESH 3DMAX 4X6 LT LRG (Mesh General) ×3 IMPLANT
NEEDLE HYPO 22GX1.5 SAFETY (NEEDLE) ×3 IMPLANT
NS IRRIG 500ML POUR BTL (IV SOLUTION) ×3 IMPLANT
PACK LAP CHOLECYSTECTOMY (MISCELLANEOUS) ×3 IMPLANT
SPONGE GAUZE 2X2 8PLY STER LF (GAUZE/BANDAGES/DRESSINGS) ×2
SPONGE GAUZE 2X2 8PLY STRL LF (GAUZE/BANDAGES/DRESSINGS) ×4 IMPLANT
SPONGE LAP 18X18 RF (DISPOSABLE) ×3 IMPLANT
STRIP CLOSURE SKIN 1/2X4 (GAUZE/BANDAGES/DRESSINGS) ×2 IMPLANT
SURGILUBE 2OZ TUBE FLIPTOP (MISCELLANEOUS) ×3 IMPLANT
SUT ETHIBOND 0 (SUTURE) ×3 IMPLANT
SUT MNCRL 4-0 (SUTURE) ×2
SUT MNCRL 4-0 27XMFL (SUTURE) ×1
SUT VIC AB 3-0 SH 27 (SUTURE) ×2
SUT VIC AB 3-0 SH 27X BRD (SUTURE) ×1 IMPLANT
SUT VICRYL 0 AB UR-6 (SUTURE) ×3 IMPLANT
SUTURE MNCRL 4-0 27XMF (SUTURE) ×1 IMPLANT
TACKER 5MM HERNIA 3.5CML NAB (ENDOMECHANICALS) IMPLANT
TRAY FOLEY MTR SLVR 16FR STAT (SET/KITS/TRAYS/PACK) ×3 IMPLANT
TROCAR 5MM SINGLE VERSAONE (TROCAR) ×6 IMPLANT
TROCAR BALLN 10M OMST10SB SPAC (TROCAR) ×3 IMPLANT
TUBING INSUFFLATION (TUBING) ×3 IMPLANT

## 2018-06-21 NOTE — Op Note (Signed)
DATE OF PROCEDURE: 06/21/2018  ATTENDING Surgeon(s): Vickie Epley, MD  ASSISTANT(S): Pabon, Marjory Lies, MD (assistance requested and appreciated for technical challenges associated with case)  ANESTHESIA: GETA  PRE-OPERATIVE DIAGNOSIS: Increasingly symptomatic (painful) Left inguinal hernia and possible recurrent Right inguinal hernia (icd-10: K40.20)  POST-OPERATIVE DIAGNOSIS: Increasingly symptomatic (painful) Left pantaloon inguinal hernia and Right groin pain attributable to post-herniorrhaphy nerve entrapment without any appreciable evidence of recurrent Right inguinal hernia (icd-10: K40.90)  PROCEDURE(S):  1.) Totally extraperitoneal laparoscopic repair of symptomatic (painful) reducible Bilateral inguinal hernias with mesh (cpt: 78242)  INTRAOPERATIVE FINDINGS: Large Left pantaloon inguinal hernia containing extensive peritoneal fat at the time of surgery  INTRAVENOUS FLUIDS: 700 mL crystalloid   ESTIMATED BLOOD LOSS: Minimal (<30 mL)  URINE OUTPUT: 200 mL  SPECIMENS: None  IMPLANTS: Left inguinal Large Bard 3D Max hernia repair mesh  DRAINS: None   COMPLICATIONS: None apparent   CONDITION AT END OF PROCEDURE: Hemodynamically stable and extubated   DISPOSITION OF PATIENT: PACU  INDICATIONS FOR PROCEDURE:  Patient is a 62 y.o. male who presented with a large symptomatic (painful) Left inguinal hernia. On exam, patient was noted to have large easily reducible Left inguinal hernias with bilateral groin tenderness to palpation. Considering possible bilaterality and possible recurrent Right inguinal hernia following prior remote open repair of Right inguinal hernia with mesh, laparoscopic inguinal hernia repair with mesh was offered. All risks, benefits, and alternatives to above procedure were discussed with the patient, all of patient's questions were answered to his expressed satisfaction, and informed consent was obtained and documented.  DETAILS OF  PROCEDURE: Patient was brought to the operating suite and appropriately identified. General anesthesia was administered along with appropriate pre-operative antibiotics, and endotracheal intubation was performed by anesthetist. In supine position, operative site was prepped and draped in the usual sterile fashion, and following a brief time out, local anesthetic was injected over the planned incision site and oblique/radial skin incision was made to the Left (3-4 o'clock) of the umbilicus using a #35 blade scalpel. This incision was then extended deep through subcutaneous tissues, and the anterior rectus fascia was identified and incised, after which patient's rectus muscle was retracted laterally to expose the posterior rectus sheath and a 10 mm balloon-tipped trocar was directed into the preperitoneal space and directed almost to the pubic symphysis. A laparoscope was then placed into the trocar, patient was placed in Trendelenburg position, and balloon was inflated under direct vision to create the extraperitoneal working space. The dissecting balloon was then desufflated, removed, and replaced with 10 mm working port. A 5 mm trocar was placed superior to the pubic symphysis, and another 5 mm trocar was placed between first and second trocars.  Starting on the patient's Left side, the preperitoneal space was further developed by exposing the epigastric vessels and keeping them anterior to the dissection plane. Cooper's ligament was dissected laterally towards its junction with the iliac vein, and dissection was continued inferiorly to the iliopubic tract with care taken to avoid injury to the femoral branch of the genitofemoral nerve and the lateral femoral cutaneous nerve. Both indirect and direct Left inguinal hernias were confirmed, and hernia sacs were reduced, systematically mobilized from the adherent cord structures, and reduced into the abdominal cavity. Similar dissection was then repeated on the  Right side, where no recurrent indirect or direct inguinal hernia could be appreciated. Medial corner of a Bard large 3D Max mesh was then rolled longitudinally and passed through the camera trocar. The mesh was  then placed along the inferior aspect of the working space and unrolled into place to completely cover the direct, indirect, and femoral spaces. The mesh was then tacked into place medially to Cooper's ligament and superiorly, taking care to create a snug fit around the cord structures and to avoid any tacks to or manipulation in the vicinity of the femoral vessels, epigastric vessels, or nerves laterally.  Hemostasis was confirmed, graspers were used to maintain approximation of the mesh under direct visualization as insufflation was stopped. Instruments were then removed, and fascia was reapproximated at the 10 mm port site using 0-0 Vicryl suture. Incisions were then closed using interrupted 3-0 Vicryl deep dermal sutures for the 10 mm umbilical port and buried interrupted 4-0 Monocryl suture to re-approximate epidermis. The skin was then cleaned, dried, and sterile skin glue was applied. Patient was then safely able to be extubated, awakened, and transferred to PACU for post-operative monitoring and care.  I was present for all aspects of the above procedure, and no operative complications were apparent.

## 2018-06-21 NOTE — Anesthesia Preprocedure Evaluation (Signed)
Anesthesia Evaluation  Patient identified by MRN, date of birth, ID band Patient awake    Reviewed: Allergy & Precautions, NPO status , Patient's Chart, lab work & pertinent test results  History of Anesthesia Complications Negative for: history of anesthetic complications  Airway Mallampati: II  TM Distance: >3 FB Neck ROM: Full    Dental  (+) Poor Dentition, Missing,    Pulmonary asthma , COPD, Current Smoker,    breath sounds clear to auscultation- rhonchi (-) wheezing      Cardiovascular hypertension, Pt. on medications (-) CAD, (-) Past MI, (-) Cardiac Stents and (-) CABG  Rhythm:Regular Rate:Normal - Systolic murmurs and - Diastolic murmurs    Neuro/Psych PSYCHIATRIC DISORDERS negative neurological ROS     GI/Hepatic PUD, GERD  ,(+) Hepatitis -, C  Endo/Other  diabetes, Insulin Dependent  Renal/GU negative Renal ROS     Musculoskeletal  (+) Arthritis ,   Abdominal (+) - obese,   Peds  Hematology negative hematology ROS (+)   Anesthesia Other Findings Past Medical History: No date: Arthritis No date: Asthma No date: Benign neoplasm of ascending colon     Comment:  polyps No date: Constipation No date: COPD (chronic obstructive pulmonary disease) (HCC)     Comment:  NO INHALER No date: Diabetes mellitus without complication (HCC)     Comment:  Type 2 No date: Diverticulosis No date: Dyspnea     Comment:  RARE-WITH EXERTION DUE TO COPD No date: GERD (gastroesophageal reflux disease) No date: Hepatitis C No date: History of kidney stones     Comment:  H/O No date: Hyperlipidemia 06/10/2018: Hypertension     Comment:  currently not under control No date: Peripheral vascular disease (HCC) No date: Restless legs No date: Schizoaffective disorder ([place])     Comment:  Pt denies No date: Thrombocytopenia ([place]) No date: Vitamin D deficiency   Reproductive/Obstetrics                               Anesthesia Physical Anesthesia Plan  ASA: III  Anesthesia Plan: General   Post-op Pain Management:    Induction: Intravenous  PONV Risk Score and Plan: 0 and Ondansetron and Midazolam  Airway Management Planned: Oral ETT  Additional Equipment:   Intra-op Plan:   Post-operative Plan: Extubation in OR  Informed Consent: I have reviewed the patients History and Physical, chart, labs and discussed the procedure including the risks, benefits and alternatives for the proposed anesthesia with the patient or authorized representative who has indicated his/her understanding and acceptance.   Dental advisory given  Plan Discussed with: CRNA and Anesthesiologist  Anesthesia Plan Comments:         Anesthesia Quick Evaluation

## 2018-06-21 NOTE — Interval H&P Note (Signed)
History and Physical Interval Note:  06/21/2018 7:20 AM  Mark Nash  has presented today for surgery, with the diagnosis of inguinal hernia  The various methods of treatment have been discussed with the patient and family. After consideration of risks, benefits and other options for treatment, the patient has consented to  Procedure(s): [place] (Bilateral) as a surgical intervention .  The patient's history has been reviewed, patient examined, no change in status, stable for surgery.  I have reviewed the patient's chart and labs.  Questions were answered to the patient's satisfaction.     Vickie Epley

## 2018-06-21 NOTE — Anesthesia Procedure Notes (Signed)
Procedure Name: Intubation Performed by: Demetrius Charity, CRNA Pre-anesthesia Checklist: Patient identified, Patient being monitored, Timeout performed, Emergency Drugs available and Suction available Patient Re-evaluated:Patient Re-evaluated prior to induction Oxygen Delivery Method: Circle system utilized Preoxygenation: Pre-oxygenation with 100% oxygen Induction Type: IV induction Ventilation: Mask ventilation without difficulty Laryngoscope Size: Mac and 3 Grade View: Grade I Tube type: Oral Tube size: 7.5 mm Number of attempts: 1 Airway Equipment and Method: Stylet Placement Confirmation: ETT inserted through vocal cords under direct vision,  positive ETCO2 and breath sounds checked- equal and bilateral Secured at: 21 cm Tube secured with: Tape Dental Injury: Teeth and Oropharynx as per pre-operative assessment

## 2018-06-21 NOTE — Anesthesia Post-op Follow-up Note (Signed)
Anesthesia QCDR form completed.        

## 2018-06-21 NOTE — OR Nursing (Signed)
Dr Rosana Hoes aware that  Patient refused Tylenol.

## 2018-06-21 NOTE — Anesthesia Postprocedure Evaluation (Signed)
Anesthesia Post Note  Patient: Mark Nash  Procedure(s) Performed: LAPAROSCOPIC BILATERAL INGUINAL HERNIA REPAIR (Bilateral Abdomen)  Patient location during evaluation: PACU Anesthesia Type: General Level of consciousness: awake and alert and oriented Pain management: pain level controlled Vital Signs Assessment: post-procedure vital signs reviewed and stable Respiratory status: spontaneous breathing, nonlabored ventilation and respiratory function stable Cardiovascular status: blood pressure returned to baseline and stable Postop Assessment: no signs of nausea or vomiting Anesthetic complications: no     Last Vitals:  Vitals:   06/21/18 1039 06/21/18 1101  BP:  140/73  Pulse: 63 62  Resp: 15 14  Temp: (!) 36.4 C 36.4 C  SpO2: 93% 96%    Last Pain:  Vitals:   06/21/18 1101  TempSrc: Temporal  PainSc: 10-Worst pain ever                 Arben Packman

## 2018-06-21 NOTE — Transfer of Care (Signed)
Immediate Anesthesia Transfer of Care Note  Patient: Mark Nash  Procedure(s) Performed: LAPAROSCOPIC BILATERAL INGUINAL HERNIA REPAIR (Bilateral Abdomen)  Patient Location: PACU  Anesthesia Type:General  Level of Consciousness: drowsy  Airway & Oxygen Therapy: Patient Spontanous Breathing and Patient connected to face mask oxygen  Post-op Assessment: Report given, VSS  Post vital signs: Reviewed and stable  Last Vitals:  Vitals Value Taken Time  BP 144/54 06/21/2018  9:31 AM  Temp    Pulse 73 06/21/2018  9:32 AM  Resp 23 06/21/2018  9:32 AM  SpO2 100 % 06/21/2018  9:32 AM  Vitals shown include unvalidated device data.  Last Pain:  Vitals:   06/21/18 0601  TempSrc: Temporal  PainSc: 8          Complications: No apparent anesthesia complications

## 2018-06-21 NOTE — Discharge Instructions (Addendum)
AMBULATORY SURGERY  DISCHARGE INSTRUCTIONS   1) The drugs that you were given will stay in your system until tomorrow so for the next 24 hours you should not:  A) Drive an automobile B) Make any legal decisions C) Drink any alcoholic beverage   2) You may resume regular meals tomorrow.  Today it is better to start with liquids and gradually work up to solid foods.  You may eat anything you prefer, but it is better to start with liquids, then soup and crackers, and gradually work up to solid foods.   3) Please notify your doctor immediately if you have any unusual bleeding, trouble breathing, redness and pain at the surgery site, drainage, fever, or pain not relieved by medication.    4) Additional Instructions:        Please contact your physician with any problems or Same Day Surgery at [phone], Monday through Friday 6 am to 4 pm, or [place] at [facility] number at [phone].In addition to included general post-operative instructions for Laparoscopic Inguinal Hernia Repair with mesh,  Diet: Resume home heart healthy diet.   Activity: No heavy lifting >15 - 20 pounds (children, pets, laundry, garbage) or strenuous activity x 2 weeks until follow-up, but light activity and walking are encouraged. Do not drive or drink alcohol if taking narcotic pain medications.  Wound care: 2 days after surgery (Sunday, 10/27), you may shower/get incision wet with soapy water and pat dry (do not rub incisions), but no baths or submerging incision underwater until follow-up.   Medications: Resume all home medications. For mild to moderate pain: acetaminophen (Tylenol) or ibuprofen/naproxen (if no kidney disease). Combining Tylenol with alcohol can substantially increase your risk of causing liver disease. Narcotic pain medications, if prescribed, can be used for severe pain, though may cause nausea, constipation, and drowsiness. Do not combine Tylenol and Percocet (or similar) within  a 6 hour period as Percocet (and similar) contain(s) Tylenol. If you do not need the narcotic pain medication, you do not need to fill the prescription.  Call office [phone]) at any time if any questions, worsening pain, fevers/chills, bleeding, drainage from incision site, or other concerns.

## 2018-06-24 ENCOUNTER — Other Ambulatory Visit: Payer: Self-pay | Admitting: *Deleted

## 2018-06-24 DIAGNOSIS — K4090 Unilateral inguinal hernia, without obstruction or gangrene, not specified as recurrent: Secondary | ICD-10-CM

## 2018-06-24 MED ORDER — OXYCODONE HCL 5 MG PO TABS: 5.0000 mg | ORAL_TABLET | Freq: Four times a day (QID) | ORAL | 0 refills | Status: DC | PRN

## 2018-06-24 NOTE — Telephone Encounter (Signed)
Patient is calling wanting to know if he can get some more oxycodone, he is still in pain and swollen. He had surgery on Friday with Dr.Davis. He stated that he is getting nauseous during the day and it comes and goes. I asked him does it get worse after taking his pain medication and patient stated that yes it does. He may need to try some different pain medicine. Please call and advise

## 2018-06-24 NOTE — Telephone Encounter (Signed)
Patient to pick up pain medication.

## 2018-06-24 NOTE — Telephone Encounter (Signed)
Patient had surgery done on 06/21/2018 by Dr. Rosana Hoes. Called patient back and he stated that he was requesting for more pain medication since he is almost out of his Oxycodone. Patient also stated that he had been having nausea but no vomiting but thinks that it comes from his pain medication. I asked him if he was eating when he would take his pain medication and he stated that he would but not much since he does not have an appetite. I recommended for him to start taking Ibuprofen 800 MG every 8 hours for pain in the meantime since I needed to speak to the surgeon on call since Dr. Rosana Hoes is not in the office all this week. I also recommended to continue applying ice on his bilateral inguinals. I also told him that once I spoke to the surgeon, that I would give him a call back. Patient had no further questions.

## 2018-06-25 IMAGING — MR MR LUMBAR SPINE WO/W CM
4 of 8 series · 17 of 48 positions shown · IV contrast (multihance)
Comparison: Intraoperative lateral view of the lumbar spine
07/17/2016. Preoperative lumbar spine MR 07/13/2016.

CLINICAL DATA: 60-year-old male post surgery in June 2016.
Noticed area of swelling below scar. No fevers. Question seroma
versus abscess versus leak. Subsequent encounter.

EXAM:
MRI LUMBAR SPINE WITHOUT AND WITH CONTRAST
TECHNIQUE: Multiplanar and multiecho pulse sequences of the lumbar spine were
obtained without and with intravenous contrast.
CONTRAST:  15mL MULTIHANCE GADOBENATE DIMEGLUMINE 529 MG/ML IV SOLN

[Series 3: T2 · sagittal · 4.0mm · 0.55mm/px · 4 of 13 slices shown (1 of 2)]
[im 1/13]
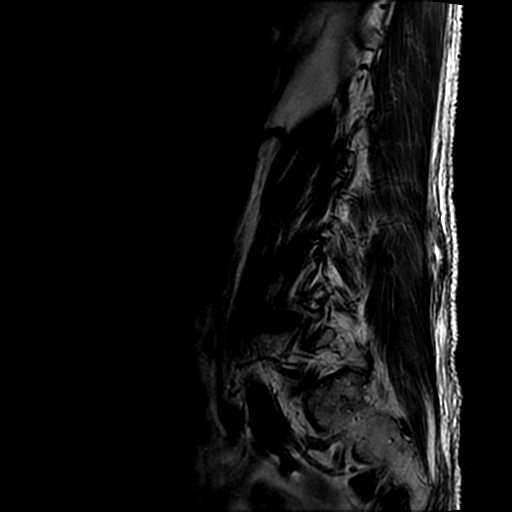
[im 5/13]
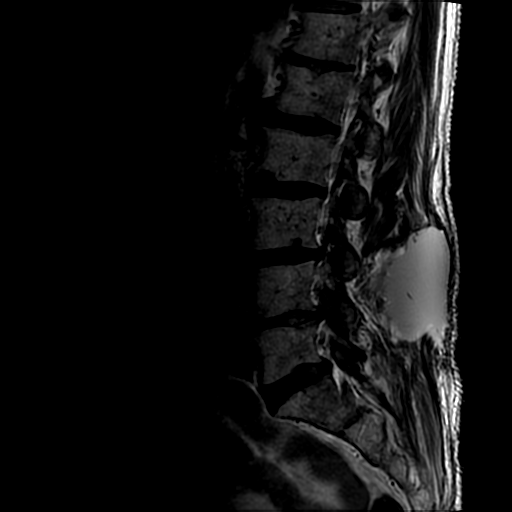
[im 9/13]
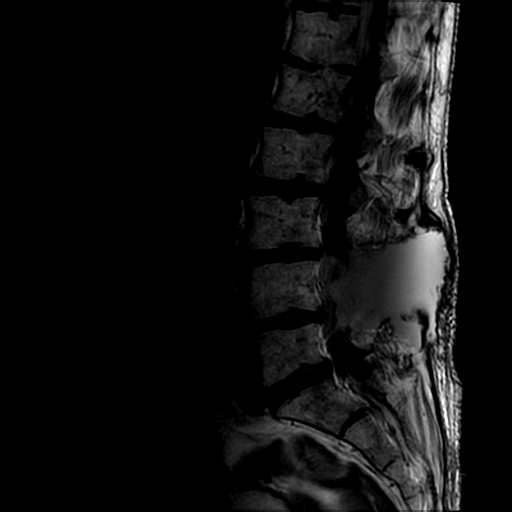
[im 13/13]
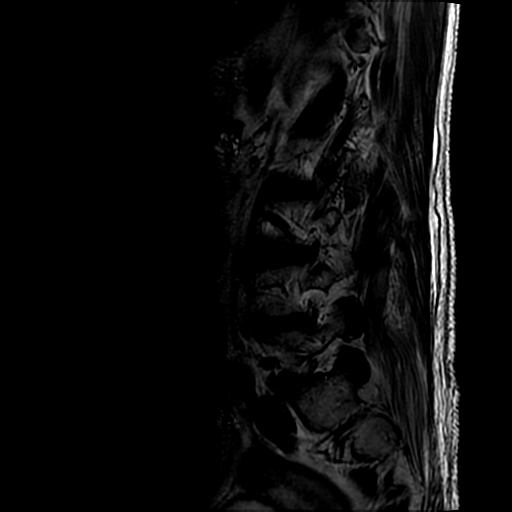

[Series 4: T1 · sagittal · 4.0mm · 0.55mm/px · 3 of 13 slices shown (1 of 2)]
[im 1/13]
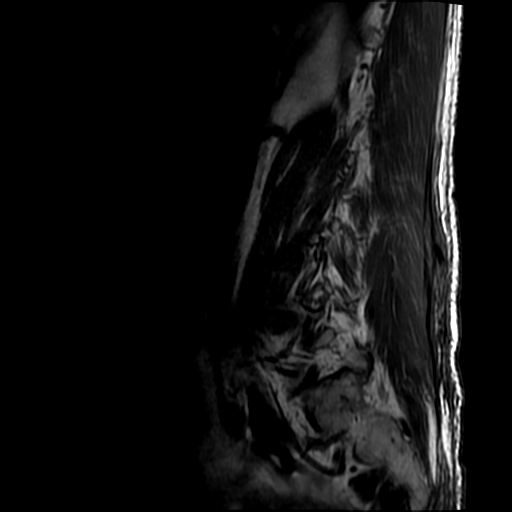
[im 7/13]
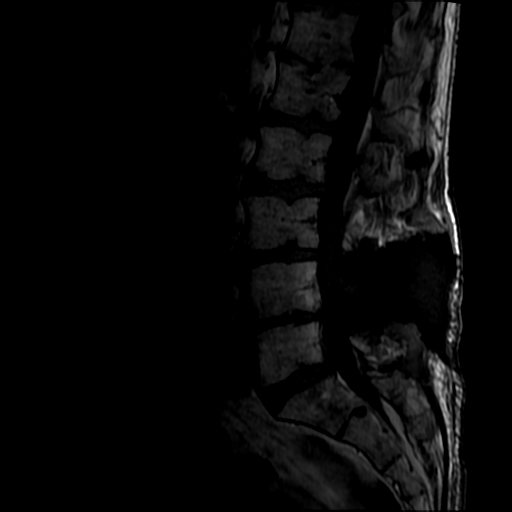
[im 13/13]
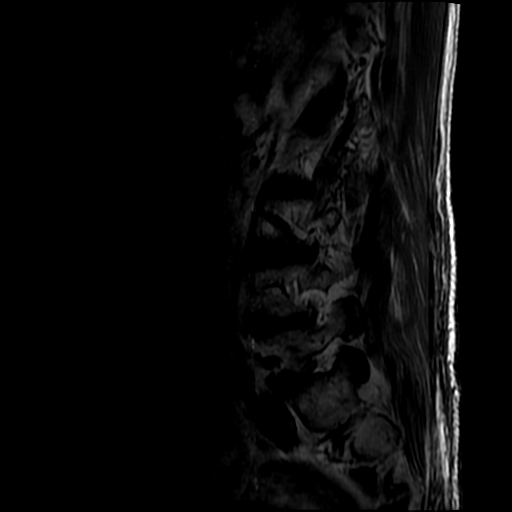

[Series 6: T2 · axial · 4.0mm · 0.39mm/px · z∈[-42,+176]mm · 7 of 46 slices shown (2 of 2)]
[im 1/46]
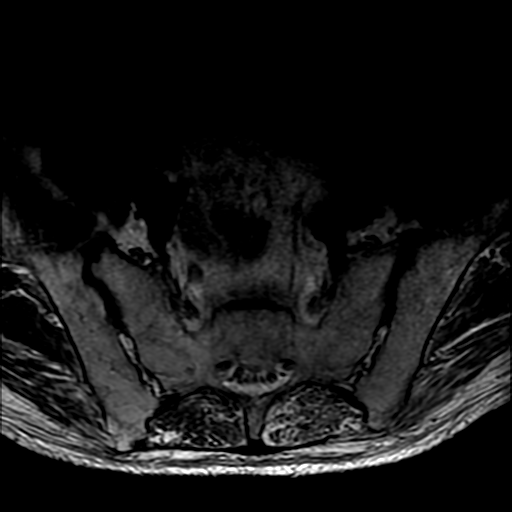
[im 6/46]
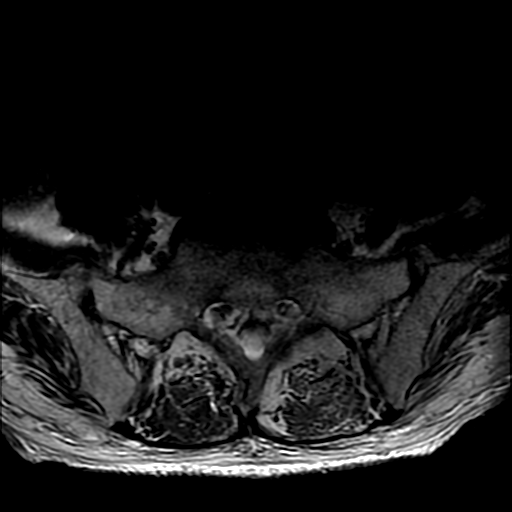
[im 16/46]
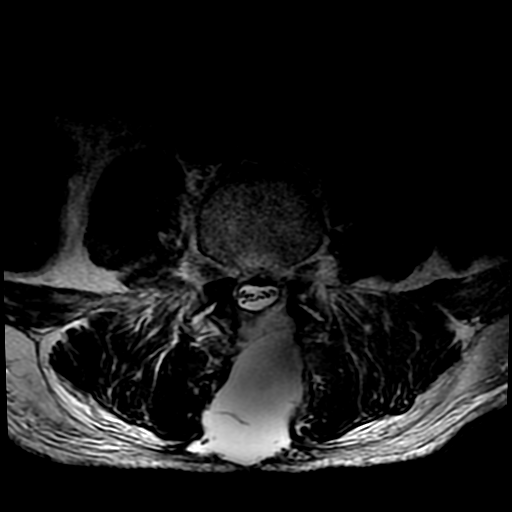
[im 21/46]
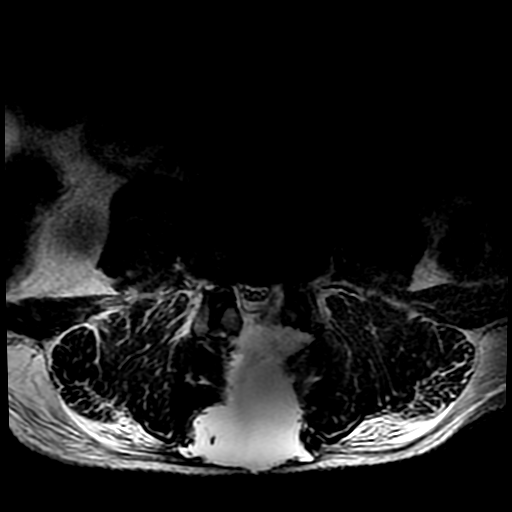
[im 26/46]
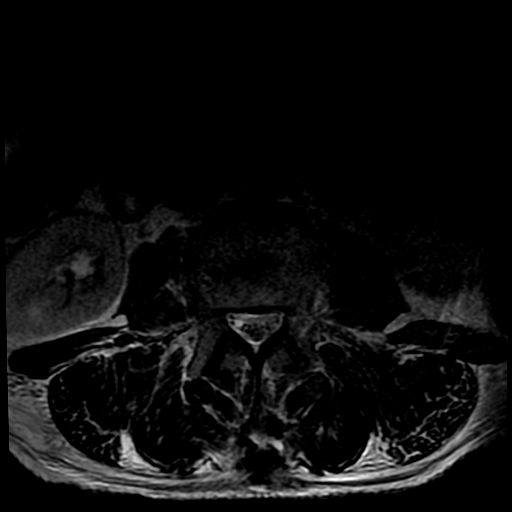
[im 31/46]
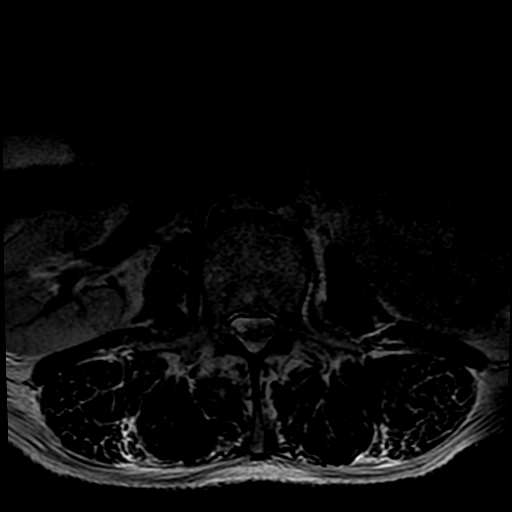
[im 41/46]
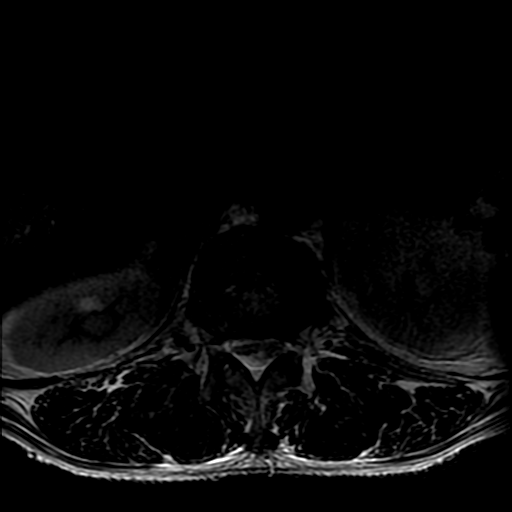

[Series 7: T1 · axial · 4.0mm · 0.39mm/px · z∈[-17,+176]mm · 3 of 46 slices shown (2 of 2)]
[im 6/46]
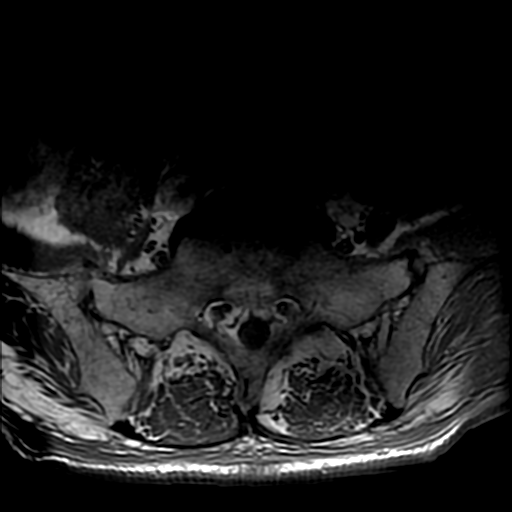
[im 26/46]
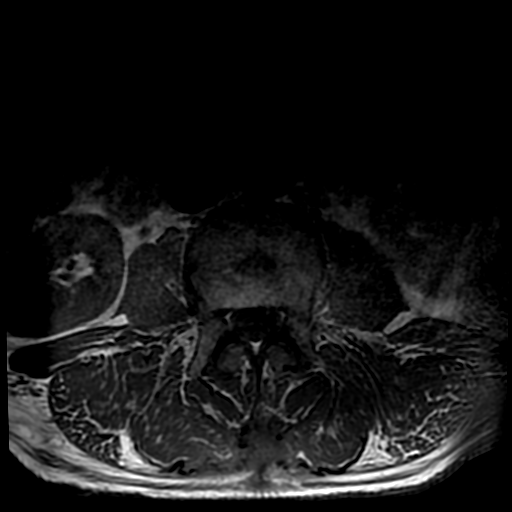
[im 41/46]
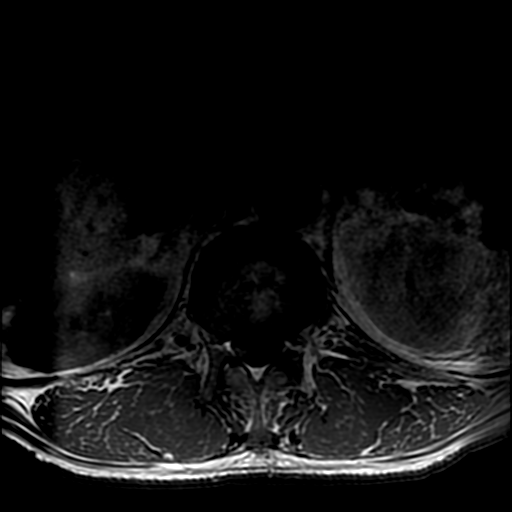

[17 of 48 positions shown; findings below may reference images not displayed]

FINDINGS: Segmentation: Last fully open disk space is labeled L5-S1. Present
examination incorporates from T12-L1 disc space through lower
sacrum.

Alignment:  Slight straightening.

Vertebrae:  No abnormal enhancing lesion.

Conus medullaris: Extends to the L1 level and appears normal.

Paraspinal and other soft tissues: Renal cysts greater on the right.

Congenitally narrowed canal.  Additionally:

T11-12:  Schmorl's node deformity.

T12-L1: There may be fusion across the disc space. Bulge and
osteophyte slightly greater to right. Mild indentation ventral
thecal sac.

L1-2: Schmorl's node deformity. Small broad-based left paracentral
protrusion with indentation left ventral thecal sac. Mild facet
degenerative changes.

L2-3: Schmorl's node deformity. Mild bulge. Mild indentation ventral
thecal sac. Mild facet degenerative changes.

L3-4: Small Schmorl's node deformity. Bulge with foraminal extension
greater on left. Left greater right foraminal narrowing. Facet
degenerative changes. Mild spinal stenosis.

L4-5: Prior laminectomy. Large 7 x 6.3 x 5.5 cm postoperative fluid
collection extends from the L3-4 disc space level to the upper L5
level. There is a portion of fluid collection with extends along the
left lateral aspect of the thecal sac at the L3-4 level an L4-5
level. This reaches to the subcutaneous region. This may represent
result of postoperative leak, hemorrhage/seroma or in the proper
clinical setting infection. This contributes to flattening of the
posterior aspect of the thecal sac greater on the left.

L4-5 bulge greater to left. Multifactorial mild spinal stenosis
greater on the left. Moderate foraminal narrowing greater on the
left. Mild bilateral foraminal narrowing.

L5-S1: Small central protrusion with indentation ventral thecal sac.
Mild facet degenerative changes. Mild bilateral foraminal narrowing.
IMPRESSION: L4-5 prior laminectomy. Large 7 x 6.3 x 5.5 cm postoperative fluid
collection in the laminectomy site extends to the just below the
skin. There is a portion of fluid collection with extends along the
left lateral aspect of the thecal sac at the L3-4 level an L4-5
level. This may represent result of postoperative leak,
hemorrhage/seroma or in the proper clinical setting infection. This
contributes to flattening of the posterior aspect of the thecal sac
greater on the left.

L4-5 bulge greater to left. Multifactorial mild spinal stenosis
greater on the left. Moderate foraminal narrowing greater on the
left. Mild bilateral foraminal narrowing.

Remainder of degenerative changes which are superimposed upon a
congenitally narrowed canal appear similar to preoperative exam as
detailed.

## 2018-07-01 ENCOUNTER — Other Ambulatory Visit: Payer: Self-pay | Admitting: Family Medicine

## 2018-07-01 DIAGNOSIS — G629 Polyneuropathy, unspecified: Secondary | ICD-10-CM

## 2018-07-01 DIAGNOSIS — I10 Essential (primary) hypertension: Secondary | ICD-10-CM

## 2018-07-04 ENCOUNTER — Encounter: Payer: Self-pay | Admitting: Surgery

## 2018-07-04 ENCOUNTER — Ambulatory Visit (INDEPENDENT_AMBULATORY_CARE_PROVIDER_SITE_OTHER): Payer: PPO | Admitting: Surgery

## 2018-07-04 ENCOUNTER — Other Ambulatory Visit: Payer: Self-pay

## 2018-07-04 VITALS — BP 124/72 | HR 72 | Temp 97.9°F | Ht 70.0 in | Wt 159.6 lb

## 2018-07-04 DIAGNOSIS — K4090 Unilateral inguinal hernia, without obstruction or gangrene, not specified as recurrent: Secondary | ICD-10-CM

## 2018-07-04 DIAGNOSIS — Z4889 Encounter for other specified surgical aftercare: Secondary | ICD-10-CM

## 2018-07-04 NOTE — Patient Instructions (Signed)

## 2018-07-05 ENCOUNTER — Encounter: Payer: Self-pay | Admitting: Surgery

## 2018-07-05 NOTE — Progress Notes (Signed)
Surgical Clinic Progress/Follow-up Note   HPI:  62 y.o. Male presents to clinic for post-op follow-up 13 Days s/p laparoscopic repair with mesh of increasingly symptomatic pantaloon Left inguinal hernia Rosana Hoes, 06/21/2018). Patient reports complete resolution of pre-operative Left groin painpain and has been tolerating regular diet with +flatus and normal BM's, denies N/V, fever/chills, CP, or SOB. He, however, continues to smoke cigarettes, though says "less".  Review of Systems:  Constitutional: denies fever/chills  Respiratory: denies shortness of breath, wheezing  Cardiovascular: denies chest pain, palpitations  Gastrointestinal: abdominal pain, N/V, and bowel function as per interval history Skin: Denies any other rashes or skin discolorations except post-surgical wounds as per interval history  Vital Signs:  BP 124/72   Pulse 72   Temp 97.9 F (36.6 C) (Temporal)   Ht 5\' 10"  (1.778 m)   Wt 159 lb 9.6 oz (72.4 kg)   BMI 22.90 kg/m    Physical Exam:  Constitutional:  -- Normal body habitus  -- Awake, alert, and oriented x3  Pulmonary:  -- No crackles -- Equal breath sounds bilaterally -- Breathing non-labored at rest Cardiovascular:  -- S1, S2 present  -- No pericardial rubs  Gastrointestinal:  -- Soft and non-distended, non-tender to palpation, no guarding/rebound tenderness -- Post-surgical incisions all well-approximated without any peri-incisional erythema or drainage -- No abdominal masses appreciated, pulsatile or otherwise  Musculoskeletal / Integumentary:  -- Wounds or skin discoloration: None appreciated except post-surgical incisions as described above (GI) -- Extremities: B/L UE and LE FROM, hands and feet warm, no edema   Imaging: No new pertinent imaging available for review  Assessment:  62 y.o. yo Male with a problem list including...  Patient Active Problem List   Diagnosis Date Noted  . Left inguinal hernia   . CLE (columnar lined esophagus)    . Gastric irritation   . Duodenum ulcer   . Personal history of colonic polyps   . Encounter for screening colonoscopy   . Polyp of sigmoid colon   . Diverticulosis of large intestine without diverticulitis   . Benign neoplasm of ascending colon   . Osteoarthritis of both hands 09/04/2017  . Chronic pain of left knee 09/04/2017  . Abnormal MRI, knee 09/04/2017  . 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 Lymphedema 07/07/2016  . Chronic hepatitis C without hepatic coma ([place]) 06/01/2016  . Lumbar herniated disc 11/24/2015  . Protein malnutrition ([place]) 11/11/2015  . Thrombocytopenia ([place]) 11/11/2015  . Hepatitis C antibody test positive 07/13/2015  . Chronic constipation 07/13/2015  . Abnormal immunological findings in specimens from other organs, systems and tissues 07/13/2015  . CN (constipation) 07/13/2015  . Benign hypertension 03/08/2015  . COPD, mild ([place]) 03/08/2015  . Dyslipidemia 03/08/2015  . Paranoid schizophrenia ([place]) 03/08/2015  . Acquired polyneuropathy 03/08/2015  . Restless leg 03/08/2015  . Tobacco abuse 03/08/2015  . Callus of foot 03/08/2015  . Claudication ([place]) 03/08/2015  . Chronic obstructive pulmonary disease ([place]) 03/08/2015  . Corn or callus 03/08/2015  . Essential (primary) hypertension 03/08/2015  . HLD (hyperlipidemia) 03/08/2015  . Peripheral vascular disease ([place]) 03/08/2015  . Polyneuropathy 03/08/2015  . Current tobacco use 03/08/2015  . Vitamin D deficiency 04/26/2009  . Avitaminosis D 04/26/2009    presents to clinic for post-op follow-up evaluation, doing overall well from a surgical perspective 13 Days/weeks s/p laparoscopic repair with mesh of increasingly symptomatic pantaloon Left inguinal hernia Rosana Hoes, 06/21/2018).  Plan:              - advance diet  as tolerated              - okay to submerge incisions under water (baths, swimming) prn             - no heavy lifting >40 lbs x 2 more weeks, after which may gradually resume  all activities without restrictions             - apply sunblock particularly to incisions with sun exposure to reduce pigmentation of scars             - return to clinic as needed, instructed to call office if any questions or concerns  All of the above recommendations were discussed with the patient, and all of patient'squestions were answered to his expressed satisfaction.  -- Marilynne Drivers Rosana Hoes, MD, [place]: [facility] - Partnering for exceptional care. Office: [phone]

## 2018-07-08 ENCOUNTER — Other Ambulatory Visit: Payer: Self-pay | Admitting: Family Medicine

## 2018-07-08 DIAGNOSIS — M5440 Lumbago with sciatica, unspecified side: Principal | ICD-10-CM

## 2018-07-08 DIAGNOSIS — G8929 Other chronic pain: Secondary | ICD-10-CM

## 2018-07-10 DIAGNOSIS — E1142 Type 2 diabetes mellitus with diabetic polyneuropathy: Secondary | ICD-10-CM | POA: Diagnosis not present

## 2018-07-10 DIAGNOSIS — L851 Acquired keratosis [keratoderma] palmaris et plantaris: Secondary | ICD-10-CM | POA: Diagnosis not present

## 2018-07-10 DIAGNOSIS — B351 Tinea unguium: Secondary | ICD-10-CM | POA: Diagnosis not present

## 2018-08-06 ENCOUNTER — Telehealth: Payer: Self-pay

## 2018-08-06 ENCOUNTER — Ambulatory Visit (INDEPENDENT_AMBULATORY_CARE_PROVIDER_SITE_OTHER): Payer: PPO | Admitting: Family Medicine

## 2018-08-06 ENCOUNTER — Encounter: Payer: Self-pay | Admitting: Family Medicine

## 2018-08-06 ENCOUNTER — Ambulatory Visit
Admission: RE | Admit: 2018-08-06 | Discharge: 2018-08-06 | Disposition: A | Discharge status: Home / Self Care | Payer: PPO | Source: Ambulatory Visit | Attending: Family Medicine | Admitting: Family Medicine

## 2018-08-06 VITALS — BP 140/76 | HR 80 | Temp 98.2°F | Resp 16 | Ht 70.0 in | Wt 162.5 lb

## 2018-08-06 DIAGNOSIS — M160 Bilateral primary osteoarthritis of hip: Secondary | ICD-10-CM | POA: Diagnosis not present

## 2018-08-06 DIAGNOSIS — R221 Localized swelling, mass and lump, neck: Secondary | ICD-10-CM | POA: Diagnosis not present

## 2018-08-06 DIAGNOSIS — R202 Paresthesia of skin: Secondary | ICD-10-CM | POA: Diagnosis not present

## 2018-08-06 DIAGNOSIS — M79604 Pain in right leg: Secondary | ICD-10-CM

## 2018-08-06 DIAGNOSIS — M25551 Pain in right hip: Secondary | ICD-10-CM | POA: Insufficient documentation

## 2018-08-06 DIAGNOSIS — R1031 Right lower quadrant pain: Secondary | ICD-10-CM

## 2018-08-06 DIAGNOSIS — M79605 Pain in left leg: Secondary | ICD-10-CM | POA: Diagnosis not present

## 2018-08-06 NOTE — Progress Notes (Signed)
Name: Mark Nash   MRN: [id number]    DOB: Dec 28, 1955   Date:08/06/2018       Progress Note  Subjective  Chief Complaint  Chief Complaint  Patient presents with  . Leg Pain  . Mass    on the left side of his neck x 1 year. He denies pain. Knot is sore. Change in size.    HPI  Mass left side of neck: non tender, started small about one year ago, but now more prominent and he is worried, no redness or drainage. No fever or chills  Bilateral leg pain: he has a history of claudication, seen by vascular surgeon a couple of years ago, also had back surgery and has been on gabapentin for years, however pain is getting worse, he states does not have to stop while walking, but has pain on calves, however after activity pain is worse and is affecting ability to sleep.   Right groin pain; worse with activity, he has a history of OA of hands and knee, never had hip x-ray, pain is described as sharp , does not radiate.   Patient Active Problem List   Diagnosis Date Noted  . CLE (columnar lined esophagus)   . Gastric irritation   . Duodenum ulcer   . Personal history of colonic polyps   . Polyp of sigmoid colon   . Diverticulosis of large intestine without diverticulitis   . Benign neoplasm of ascending colon   . Osteoarthritis of both hands 09/04/2017  . Chronic pain of left knee 09/04/2017  . Abnormal MRI, knee 09/04/2017  . 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 Lymphedema 07/07/2016  . Chronic hepatitis C without hepatic coma ([place]) 06/01/2016  . Lumbar herniated disc 11/24/2015  . Protein malnutrition ([place]) 11/11/2015  . Thrombocytopenia ([place]) 11/11/2015  . Hepatitis C antibody test positive 07/13/2015  . Chronic constipation 07/13/2015  . Abnormal immunological findings in specimens from other organs, systems and tissues 07/13/2015  . CN (constipation) 07/13/2015  . Benign hypertension 03/08/2015  . COPD, mild ([place]) 03/08/2015  . Dyslipidemia 03/08/2015  . Paranoid  schizophrenia ([place]) 03/08/2015  . Acquired polyneuropathy 03/08/2015  . Restless leg 03/08/2015  . Tobacco abuse 03/08/2015  . Callus of foot 03/08/2015  . Claudication ([place]) 03/08/2015  . Chronic obstructive pulmonary disease ([place]) 03/08/2015  . Corn or callus 03/08/2015  . Essential (primary) hypertension 03/08/2015  . HLD (hyperlipidemia) 03/08/2015  . Peripheral vascular disease ([place]) 03/08/2015  . Polyneuropathy 03/08/2015  . Current tobacco use 03/08/2015  . Vitamin D deficiency 04/26/2009  . Avitaminosis D 04/26/2009    Past Surgical History:  Procedure Laterality Date  . BACK SURGERY    . COLONOSCOPY WITH PROPOFOL N/A 09/07/2017   Procedure: COLONOSCOPY WITH PROPOFOL;  Surgeon: Virgel Manifold, MD;  Location: [facility] END[facility]COPY;  Service: Endoscopy;  Laterality: N/A;  . ESOPHAGOGASTRODUODENOSCOPY (EGD) WITH PROPOFOL N/A 12/07/2017   Procedure: ESOPHAGOGASTRODUODENOSCOPY (EGD) WITH PROPOFOL;  Surgeon: Virgel Manifold, MD;  Location: [facility] ENDOSCOPY;  Service: Endoscopy;  Laterality: N/A;  . FLEXIBLE SIGMOIDOSCOPY N/A 11/09/2017   Procedure: FLEXIBLE SIGMOIDOSCOPY;  Surgeon: Virgel Manifold, MD;  Location: [facility] ENDOSCOPY;  Service: Endoscopy;  Laterality: N/A;  . HERNIA REPAIR     inguinal  . INGUINAL HERNIA REPAIR Bilateral 06/21/2018   Procedure: LAPAROSCOPIC BILATERAL INGUINAL HERNIA REPAIR;  Surgeon: Vickie Epley, MD;  Location: [facility] ORS;  Service: General;  Laterality: Bilateral;  . L3 TO L5 LAMINECTOMY FOR DECOMPRESSION  07/17/2016    NEUROSURGERY AND SPINE  .  REPAIR OF CEREBROSPINAL FLUID LEAK N/A 09/08/2016   Procedure: Lumbar wound exploration, repair of pseudomenigocele;  Surgeon: Kevan Ny Ditty, MD;  Location: [place];  Service: Neurosurgery;  Laterality: N/A;  Lumbar wound exploration, repair of pseudomenigocele  . SINUSOTOMY      Family History  Problem Relation Age of Onset  . Hypertension Mother   . Diverticulitis Mother   . Colitis  Mother   . Diabetes Father   . Hypertension Father   . Heart disease Father   . Diverticulitis Brother   . Diabetes Sister     Social History   Socioeconomic History  . Marital status: Divorced    Spouse name: Not on file  . Number of children: 2  . Years of education: Not on file  . Highest education level: 12th grade  Occupational History  . Occupation: Disabled  Social Needs  . Financial resource strain: Not hard at all  . Food insecurity:    Worry: Never true    Inability: Never true  . Transportation needs:    Medical: No    Non-medical: No  Tobacco Use  . Smoking status: Current Every Day Smoker    Packs/day: 1.50    Years: 46.00    Pack years: 69.00    Types: Cigarettes  . Smokeless tobacco: Never Used  Substance and Sexual Activity  . Alcohol use: No    Alcohol/week: 0.0 standard drinks  . Drug use: No  . Sexual activity: Not Currently  Lifestyle  . Physical activity:    Days per week: 1 day    Minutes per session: 20 min  . Stress: Not at all  Relationships  . Social connections:    Talks on phone: Three times a week    Gets together: Three times a week    Attends religious service: Never    Active member of club or organization: No    Attends meetings of clubs or organizations: Never    Relationship status: Divorced  . Intimate partner violence:    Fear of current or ex partner: No    Emotionally abused: No    Physically abused: No    Forced sexual activity: No  Other Topics Concern  . Not on file  Social History Narrative   Lives alone     Current Outpatient Medications:  .  gabapentin (NEURONTIN) 300 MG capsule, TAKE 1 CAPSULE BY MOUTH THREE TIMES DAILY, Disp: 270 capsule, Rfl: 0 .  hydrochlorothiazide (HYDRODIURIL) 12.5 MG tablet, TAKE 1 TABLET BY MOUTH ONCE DAILY, Disp: 90 tablet, Rfl: 0 .  LEVEMIR 100 UNIT/ML injection, INJECT 5 UNITS SUBCUTANEOUSLY ONCE DAILY IN THE EVENING (Patient taking differently: Inject 4 Units into the skin  daily at 3 pm. ), Disp: 10 mL, Rfl: 0 .  lisinopril (PRINIVIL,ZESTRIL) 20 MG tablet, Take 1 tablet (20 mg total) by mouth daily. (Patient taking differently: Take 20 mg by mouth every morning. ), Disp: 90 tablet, Rfl: 1 .  meloxicam (MOBIC) 7.5 MG tablet, Take 7.5 mg by mouth daily., Disp: , Rfl:  .  methocarbamol (ROBAXIN) 750 MG tablet, TAKE 1 TABLET BY MOUTH EVERY 8 HOURS AS NEEDED FOR MUSCLE SPASM, Disp: 90 tablet, Rfl: 2 .  pravastatin (PRAVACHOL) 20 MG tablet, Take 1 tablet (20 mg total) by mouth daily. (Patient taking differently: Take 20 mg by mouth daily with supper. ), Disp: 90 tablet, Rfl: 1 .  Vitamin D, Ergocalciferol, (DRISDOL) 50000 units CAPS capsule, Take 1 capsule (50,000 Units total) by mouth every  7 (seven) days. (Patient taking differently: Take 50,000 Units by mouth every 14 (fourteen) days. ), Disp: 12 capsule, Rfl: 1  Allergies  Allergen Reactions  . Penicillins Hives and Swelling    As a child. Has patient had a PCN reaction causing immediate rash, facial/tongue/throat swelling, SOB or lightheadedness with hypotension: YES  Has patient had a PCN reaction causing severe rash involving mucus membranes or skin necrosis: UNKNOWN Has patient had a PCN reaction that required hospitalization: UNKNOWN Has patient had a PCN reaction occurring within the last 10 years: NO  . Aspirin Nausea Only  . Lyrica [Pregabalin] Nausea Only    I personally reviewed active problem list, medication list, allergies, family history, social history with the patient/caregiver today.   ROS  Ten systems reviewed and is negative except as mentioned in HPI   Objective  Vitals:   08/06/18 1202  BP: 140/76  Pulse: 80  Resp: 16  Temp: 98.2 F (36.8 C)  TempSrc: Oral  SpO2: 95%  Weight: 162 lb 8 oz (73.7 kg)  Height: _0  (1.778 m)    Body mass index is 23.32 kg/m.  Physical Exam  Constitutional: Patient appears well-developed and well-nourished. No distress.  HEENT: head  atraumatic, normocephalic, pupils equal and reactive to light,  neck supple, throat within normal limits Cardiovascular: Normal rate, regular rhythm and normal heart sounds.  No murmur heard. No BLE edema. No hair on legs and shiny skin Pulmonary/Chest: Effort normal and breath sounds normal. No respiratory distress. Abdominal: Soft.  There is no tenderness. Muscular Skeletal: pain with rom of right hip, negative straight leg raise  Psychiatric: Patient has a normal mood and affect. behavior is normal. Judgment and thought content normal.  Recent Results (from the past 2160 hour(s))  POCT HgB A1C     Status: Abnormal   Collection Time: 06/10/18  1:59 PM  Result Value Ref Range   Hemoglobin A1C 6.8 (A) 4.0 - 5.6 %   HbA1c POC (<> result, manual entry)     HbA1c, POC (prediabetic range)     HbA1c, POC (controlled diabetic range)    Basic metabolic panel     Status: Abnormal   Collection Time: 06/19/18 12:25 PM  Result Value Ref Range   Sodium 139 135 - 145 mmol/L   Potassium 4.1 3.5 - 5.1 mmol/L   Chloride 101 98 - 111 mmol/L   CO2 30 22 - 32 mmol/L   Glucose, Bld 254 (H) 70 - 99 mg/dL   BUN 22 8 - 23 mg/dL   Creatinine, Ser 0.81 0.61 - 1.24 mg/dL   Calcium 9.0 8.9 - 10.3 mg/dL   GFR calc non Af Amer >60 >60 mL/min   GFR calc Af Amer >60 >60 mL/min    Comment: (NOTE) The eGFR has been calculated using the CKD EPI equation. This calculation has not been validated in all clinical situations. eGFR's persistently <60 mL/min signify possible Chronic Kidney Disease.    Anion gap 8 5 - 15    Comment: Performed at [facility], [address] [place], [place] [address]  CBC WITH DIFFERENTIAL     Status: None   Collection Time: 06/19/18 12:25 PM  Result Value Ref Range   WBC 7.6 4.0 - 10.5 K/uL   RBC 4.94 4.22 - 5.81 MIL/uL   Hemoglobin 15.4 13.0 - 17.0 g/dL   HCT 44.8 39.0 - 52.0 %   MCV 90.7 80.0 - 100.0 fL   MCH 31.2 26.0 - 34.0 pg  MCHC 34.4 30.0 - 36.0 g/dL   RDW  12.0 11.5 - 15.5 %   Platelets 169 150 - 400 K/uL   nRBC 0.0 0.0 - 0.2 %   Neutrophils Relative % 61 %   Neutro Abs 4.6 1.7 - 7.7 K/uL   Lymphocytes Relative 29 %   Lymphs Abs 2.2 0.7 - 4.0 K/uL   Monocytes Relative 8 %   Monocytes Absolute 0.6 0.1 - 1.0 K/uL   Eosinophils Relative 2 %   Eosinophils Absolute 0.2 0.0 - 0.5 K/uL   Basophils Relative 0 %   Basophils Absolute 0.0 0.0 - 0.1 K/uL   Immature Granulocytes 0 %   Abs Immature Granulocytes 0.02 0.00 - 0.07 K/uL    Comment: Performed at [facility], [address] [place], [place] [address]  Glucose, capillary     Status: Abnormal   Collection Time: 06/21/18  6:02 AM  Result Value Ref Range   Glucose-Capillary 117 (H) 70 - 99 mg/dL  Glucose, capillary     Status: Abnormal   Collection Time: 06/21/18  9:41 AM  Result Value Ref Range   Glucose-Capillary 125 (H) 70 - 99 mg/dL      PHQ2/9: Depression screen [facility] 2/9 06/10/2018 05/29/2018 02/19/2018 11/30/2017 10/22/2017  Decreased Interest 0 0 0 0 0  Down, Depressed, Hopeless 0 0 0 0 0  PHQ - 2 Score 0 0 0 0 0  Altered sleeping 0 0 0 0 -  Tired, decreased energy 0 0 0 0 -  Change in appetite 0 0 0 0 -  Feeling bad or failure about yourself  0 0 0 0 -  Trouble concentrating 0 0 0 0 -  Moving slowly or fidgety/restless 0 0 0 0 -  Suicidal thoughts 0 0 0 0 -  PHQ-9 Score 0 0 - 0 -  Difficult doing work/chores - - Not difficult at all Not difficult at all -     Fall Risk: Fall Risk  08/06/2018 07/04/2018 06/10/2018 02/19/2018 11/30/2017  Falls in the past year? 0 0 No No No  Risk for fall due to : - - - - Impaired vision      Assessment & Plan  1. Mass of left side of neck  - Ambulatory referral to General Surgery  2. Right groin pain  - DG HIP UNILAT WITH PELVIS 2-3 VIEWS RIGHT; Future  3. Leg pain, bilateral  - Nerve conduction test; Future  4. Bilateral leg paresthesia  - Nerve conduction test; Future

## 2018-08-06 NOTE — Telephone Encounter (Signed)
I want to see results of studies to find out if pain doctor or ortho doctor first. Depending on results I will place referral.

## 2018-08-06 NOTE — Telephone Encounter (Signed)
Copied from [place] [id number]. Topic: Quick Communication - Other Results (Clinic Use ONLY) >> Aug 06, 2018 12:52 PM Karene Fry P wrote: Pt was seen today by Dr Ancil Boozer. Stated they had discussed him being referred to a pain doctor. He did not see that listed on his Clinical Summary

## 2018-08-07 ENCOUNTER — Other Ambulatory Visit: Payer: Self-pay | Admitting: Family Medicine

## 2018-08-07 DIAGNOSIS — I739 Peripheral vascular disease, unspecified: Secondary | ICD-10-CM

## 2018-08-07 DIAGNOSIS — G8929 Other chronic pain: Secondary | ICD-10-CM

## 2018-08-07 DIAGNOSIS — M5440 Lumbago with sciatica, unspecified side: Principal | ICD-10-CM

## 2018-08-07 NOTE — Telephone Encounter (Signed)
Patient called.  Patient aware.  

## 2018-08-08 ENCOUNTER — Encounter: Payer: Self-pay | Admitting: Surgery

## 2018-08-08 ENCOUNTER — Other Ambulatory Visit: Payer: Self-pay

## 2018-08-08 ENCOUNTER — Ambulatory Visit: Payer: PPO | Admitting: Surgery

## 2018-08-08 VITALS — BP 132/70 | HR 74 | Temp 98.0°F | Ht 71.0 in | Wt 163.0 lb

## 2018-08-08 DIAGNOSIS — L723 Sebaceous cyst: Secondary | ICD-10-CM

## 2018-08-08 NOTE — Patient Instructions (Signed)
Return for excision neck cyst

## 2018-08-08 NOTE — Progress Notes (Signed)
Surgical Clinic Progress/Follow-up Note   HPI:  62 y.o. Male s/p laparoscopic repair with mesh of increasingly symptomatic pantaloon Left inguinal hernia Rosana Hoes, 06/21/2018) presents to clinic today for evaluation of a Left neck sebaceous cyst. Patient reports it's been present for years, but has recently enlarged and hurts, particularly when he tries to shave over/around it. He denies any redness around it or drainage from it and likewise denies any fever/chills, Left groin pain, abdominal pain, CP, or SOB.  Review of Systems:  Constitutional: denies any other weight loss, fever, chills, or sweats  Eyes: denies any other vision changes, history of eye injury  ENT: denies sore throat, hearing problems  Respiratory: denies shortness of breath, wheezing  Cardiovascular: denies chest pain, palpitations  Gastrointestinal: denies abdominal pain or N/V with +flatus and +BM's WNL Musculoskeletal: denies any other joint pains or cramps  Skin: Denies any other rashes or skin discolorations except as per interval history Neurological: denies any other headache, dizziness, weakness  Psychiatric: denies any other depression, anxiety  All other review of systems: otherwise negative   Vital Signs:  BP 132/70   Pulse 74   Temp 98 F (36.7 C) (Skin)   Ht 5\' 11"  (1.803 m)   Wt 163 lb (73.9 kg)   SpO2 98%   BMI 22.73 kg/m    Physical Exam:  Constitutional:  -- Normal body habitus  -- Awake, alert, and oriented x3  Eyes:  -- Pupils equally round and reactive to light  -- No scleral icterus  Ear, nose, throat:  -- No jugular venous distension  -- No nasal drainage, bleeding Pulmonary:  -- No crackles -- Equal breath sounds bilaterally -- Breathing non-labored at rest Cardiovascular:  -- S1, S2 present  -- No pericardial rubs  Gastrointestinal:  -- Soft, nontender, non-distended, no guarding/rebound  -- No abdominal masses appreciated, pulsatile or otherwise  Musculoskeletal /  Integumentary:  -- Wounds or skin discoloration: Left neck 1.5 cm moderately tender to palpation non-erythematous sebaceous cyst without apparent drainage  -- Extremities: B/L UE and LE FROM, hands and feet warm, no edema  Neurologic:  -- Motor function: intact and symmetric  -- Sensation: intact and symmetric   Imaging: No new pertinent imaging studies available for review at this time   Assessment:  62 y.o. yo Male with a problem list including...  Patient Active Problem List   Diagnosis Date Noted  . CLE (columnar lined esophagus)   . Gastric irritation   . Duodenum ulcer   . Personal history of colonic polyps   . Polyp of sigmoid colon   . Diverticulosis of large intestine without diverticulitis   . Benign neoplasm of ascending colon   . Osteoarthritis of both hands 09/04/2017  . Chronic pain of left knee 09/04/2017  . Abnormal MRI, knee 09/04/2017  . 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 Lymphedema 07/07/2016  . Chronic hepatitis C without hepatic coma ([place]) 06/01/2016  . Lumbar herniated disc 11/24/2015  . Protein malnutrition ([place]) 11/11/2015  . Thrombocytopenia ([place]) 11/11/2015  . Hepatitis C antibody test positive 07/13/2015  . Chronic constipation 07/13/2015  . Abnormal immunological findings in specimens from other organs, systems and tissues 07/13/2015  . CN (constipation) 07/13/2015  . Benign hypertension 03/08/2015  . COPD, mild ([place]) 03/08/2015  . Dyslipidemia 03/08/2015  . Paranoid schizophrenia ([place]) 03/08/2015  . Acquired polyneuropathy 03/08/2015  . Restless leg 03/08/2015  . Tobacco abuse 03/08/2015  . Callus of foot 03/08/2015  . Claudication ([place]) 03/08/2015  . Chronic obstructive  pulmonary disease ([place]) 03/08/2015  . Corn or callus 03/08/2015  . Essential (primary) hypertension 03/08/2015  . HLD (hyperlipidemia) 03/08/2015  . Peripheral vascular disease ([place]) 03/08/2015  . Polyneuropathy 03/08/2015  . Current tobacco use 03/08/2015  .  Vitamin D deficiency 04/26/2009  . Avitaminosis D 04/26/2009    s/p laparoscopic repair with mesh of increasingly symptomatic pantaloon Left inguinal hernia Rosana Hoes, 06/21/2018) presents to clinic today for evaluation of a non-infected increasingly symptomatic (painful) Left neck sebaceous cyst.  Plan:   - differential diagnoses discussed   - all risks, benefits, and alternatives to in-office excision of Left neck subcutaneous mass (likely sebaceous cyst) were discussed with the patient, all of his questions were answered to his expressed satisfaction, patient expresses he wishes to proceed, and informed consent was obtained.  - will plan for in-office excision of Left neck subcutaneous mass (likely sebaceous cyst)  - anticipate return to clinic 2 weeks following above elective procedure  - instructed to call office if any questions or concerns  All of the above recommendations were discussed with the patient, and all of patient's questions were answered to his expressed satisfaction.  -- Marilynne Drivers Rosana Hoes, MD, [place]: [facility] - Partnering for exceptional care. Office: [phone]

## 2018-08-22 ENCOUNTER — Ambulatory Visit: Payer: PPO | Admitting: Surgery

## 2018-08-22 ENCOUNTER — Other Ambulatory Visit: Payer: Self-pay

## 2018-08-22 ENCOUNTER — Encounter: Payer: Self-pay | Admitting: Surgery

## 2018-08-22 VITALS — BP 173/74 | HR 66 | Temp 97.9°F | Resp 16 | Ht 71.0 in | Wt 162.6 lb

## 2018-08-22 DIAGNOSIS — L723 Sebaceous cyst: Secondary | ICD-10-CM

## 2018-08-22 DIAGNOSIS — L578 Other skin changes due to chronic exposure to nonionizing radiation: Secondary | ICD-10-CM | POA: Diagnosis not present

## 2018-08-22 NOTE — Procedures (Signed)
SURGICAL OPERATIVE REPORT  DATE OF PROCEDURE: 08/22/2018  ATTENDING: Corene Cornea E. Rosana Hoes, MD  ANESTHESIA: Local  PRE-OPERATIVE DIAGNOSIS: Increasingly symptomatic (painful) non-infected Left anterolateral sebaceous cyst (icd-10: L72.3)   POST-OPERATIVE DIAGNOSIS: Increasingly symptomatic (painful) non-infected Left anterolateral sebaceous cyst (icd-10: L72.3)   PROCEDURE(S):  1.) Excision of symptomatic (painful) non-infected Left anterolateral neck sebaceous cyst (cpt: 40102)   INTRAOPERATIVE FINDINGS: 2 cm (wide) x 1.5 cm (tall) non-infected subcutaneous mass of the Left anterolateral neck (most likely a sebaceous cyst), removed intact  INTRAVENOUS FLUIDS: 0 mL crystalloid   ESTIMATED BLOOD LOSS: Minimal (< 20 mL)  URINE OUTPUT: No Foley  SPECIMENS: 2 cm (wife) x 1.5 cm (tall) non-infected subcutaneous Left neck mass (most likely a sebaceous cyst), removed intact  IMPLANTS: None  DRAINS: None  COMPLICATIONS: None apparent  CONDITION AT END OF PROCEDURE: Hemodynamically stable and awake  DISPOSITION OF PATIENT: PACU  INDICATIONS FOR PROCEDURE:  Patient is a 62 y.o. male who presented for an increasingly symptomatic (painful)  non-infected sebaceous cyst of his Left anterolateral neck mass. Patient reports it's been present/growing for years, and recently has become increasingly painful due in part to its location, especially with shaving. Patient requested that it be removed so activities can be performed with less discomfort, and patient was accordingly referred for surgical evaluation and management. All risks, benefits, and alternatives to above procedure were discussed with the patient, all of patient's questions were answered to his expressed satisfaction, and informed consent was obtained and documented.   DETAILS OF PROCEDURE: Patient was brought to the procedure suite and was appropriately identified. Laying on the procedural table, operative site was prepped and draped  in the usual sterile fashion, and following a brief time out, a 2 cm wide and 1.5 cm tall transverse elliptical incision was made using a #15 blade scalpel, and incision was extended deep around subcutaneous mass using sharp + blunt dissection. During the course of dissecting free the cyst, it was not disrupted and was removed intact. Direct pressure was applied for hemostasis. Further examination did not reveal any additional cyst. Hemostasis was achieved, and the wound was copiously irrigated with unused local anesthetic. Deep tissue and dermis were re-approximated using buried interrupted 3-0 Vicryl suture. Skin was then cleaned and dried, and sterile Dermabond skin glue was applied to the wound. Patient was then safely transferred to recovery for post-procedural monitoring and care.  I was present for all aspects of the above procedure, and no operative complications were apparent.

## 2018-08-22 NOTE — Patient Instructions (Addendum)
Please see your follow up appointment listed below.     We have removed a Cyst in our office today.  You have sutures under the skin that will dissolve and also dermabond (skin glue) on top of your skin which will come off on it's own in 10-14 days.  You may shower in 48 hours, this is on 08/24/18.  Avoid Strenuous activities that will make you sweat during the next 48 hours to avoid the glue coming off prematurely. Avoid activities that will place pressure to this area of the body for 1-2 weeks to avoid re-injury to incision site.  Please see your follow-up appointment provided. We will see you back in office to make sure this area is healed and to review the final pathology. If you have any questions or concerns prior to this appointment, call our office and speak with a nurse.    Excision of Skin Cysts or Lesions Excision of a skin lesion refers to the removal of a section of skin by making small cuts (incisions) in the skin. This procedure may be done to remove a cancerous (malignant) or noncancerous (benign) growth on the skin. It is typically done to treat or prevent cancer or infection. It may also be done to improve cosmetic appearance. The procedure may be done to remove:  Cancerous growths, such as basal cell carcinoma, squamous cell carcinoma, or melanoma.  Noncancerous growths, such as a cyst or lipoma.  Growths, such as moles or skin tags, which may be removed for cosmetic reasons.  Various excision or surgical techniques may be used depending on your condition, the location of the lesion, and your overall health. Tell a health care provider about:  Any allergies you have.  All medicines you are taking, including vitamins, herbs, eye drops, creams, and over-the-counter medicines.  Any problems you or family members have had with anesthetic medicines.  Any blood disorders you have.  Any surgeries you have had.  Any medical conditions you have.  Whether you are  pregnant or may be pregnant. What are the risks? Generally, this is a safe procedure. However, problems may occur, including:  Bleeding.  Infection.  Scarring.  Recurrence of the cyst, lipoma, or cancer.  Changes in skin sensation or appearance, such as discoloration or swelling.  Reaction to the anesthetics.  Allergic reaction to surgical materials or ointments.  Damage to nerves, blood vessels, muscles, or other structures.  Continued pain.  What happens before the procedure?  Ask your health care provider about: ? Changing or stopping your regular medicines. This is especially important if you are taking diabetes medicines or blood thinners. ? Taking medicines such as aspirin and ibuprofen. These medicines can thin your blood. Do not take these medicines before your procedure if your health care provider instructs you not to.  You may be asked to take certain medicines.  You may be asked to stop smoking.  You may have an exam or testing.  Plan to have someone take you home after the procedure.  Plan to have someone help you with activities during recovery. What happens during the procedure?  To reduce your risk of infection: ? Your health care team will wash or sanitize their hands. ? Your skin will be washed with soap.  You will be given a medicine to numb the area (local anesthetic).  One of the following excision techniques will be performed.  At the end of any of these procedures, antibiotic ointment will be applied as needed. Each of  the following techniques may vary among health care providers and hospitals. Complete Surgical Excision The area of skin that needs to be removed will be marked with a pen. Using a small scalpel or scissors, the surgeon will gently cut around and under the lesion until it is completely removed. The lesion will be placed in a fluid and sent to the lab for examination. If necessary, bleeding will be controlled with a device that  delivers heat (electrocautery). The edges of the wound may be stitched (sutured) together, and a bandage (dressing) will be applied. This procedure may be performed to treat a cancerous growth or a noncancerous cyst or lesion. Excision of a Cyst The surgeon will make an incision on the cyst. The entire cyst will be removed through the incision. The incision may be closed with sutures. Shave Excision During shave excision, the surgeon will use a small blade or an electrically heated loop instrument to shave off the lesion. This may be done to remove a mole or a skin tag. The wound will usually be left to heal on its own without sutures. Punch Excision During punch excision, the surgeon will use a small tool that is like a cookie cutter or a hole punch to cut a circle shape out of the skin. The outer edges of the skin will be sutured together. This may be done to remove a mole or a scar or to perform a biopsy of the lesion. Mohs Micrographic Surgery During Mohs micrographic surgery, layers of the lesion will be removed with a scalpel or a loop instrument and will be examined right away under a microscope. Layers will be removed until all of the abnormal or cancerous tissue has been removed. This procedure is minimally invasive, and it ensures the best cosmetic outcome. It involves the removal of as little normal tissue as possible. Mohs is usually done to treat skin cancer, such as basal cell carcinoma or squamous cell carcinoma, particularly on the face and ears. Depending on the size of the surgical wound, it may be sutured closed. What happens after the procedure?  Return to your normal activities as told by your health care provider.  Talk with your health care provider to discuss any test results, treatment options, and if necessary, the need for more tests. This information is not intended to replace advice given to you by your health care provider. Make sure you discuss any questions you have with  your health care provider. Document Released: 11/08/2009 Document Revised: 01/20/2016 Document Reviewed: 09/30/2014 Elsevier Interactive Patient Education  [employer].

## 2018-08-26 ENCOUNTER — Telehealth: Payer: Self-pay

## 2018-08-26 NOTE — Telephone Encounter (Signed)
Copied from [place] [id number]. Topic: Referral - Status >> Aug 26, 2018  2:42 PM Mcneil, Ja-Kwan wrote: Reason for CRM: Pt stated that he has not heard from anyone in regards to the pain management or the nerve conduction test. Pt requests call back. Cb# [phone]

## 2018-08-30 ENCOUNTER — Telehealth: Payer: Self-pay

## 2018-08-30 NOTE — Telephone Encounter (Signed)
Patient notified of pathology and reminded to keep follow up appointment.

## 2018-09-02 ENCOUNTER — Other Ambulatory Visit: Payer: Self-pay | Admitting: Family Medicine

## 2018-09-02 DIAGNOSIS — I10 Essential (primary) hypertension: Secondary | ICD-10-CM

## 2018-09-02 NOTE — Telephone Encounter (Signed)
Hypertension medication request: Lisinopril to [facility].  Last office visit pertaining to hypertension: 08/06/2018   BP Readings from Last 3 Encounters:  08/22/18 (!) 173/74  08/08/18 132/70  08/06/18 140/76    Lab Results  Component Value Date   CREATININE 0.81 06/19/2018   BUN 22 06/19/2018   NA 139 06/19/2018   K 4.1 06/19/2018   CL 101 06/19/2018   CO2 30 06/19/2018     Follow up on 10/07/2017

## 2018-09-05 ENCOUNTER — Ambulatory Visit (INDEPENDENT_AMBULATORY_CARE_PROVIDER_SITE_OTHER): Payer: PPO | Admitting: Surgery

## 2018-09-05 ENCOUNTER — Encounter: Payer: Self-pay | Admitting: Surgery

## 2018-09-05 ENCOUNTER — Other Ambulatory Visit: Payer: Self-pay

## 2018-09-05 VITALS — BP 118/74 | HR 84 | Temp 97.8°F | Resp 13 | Ht 69.0 in | Wt 161.0 lb

## 2018-09-05 DIAGNOSIS — Z4889 Encounter for other specified surgical aftercare: Secondary | ICD-10-CM

## 2018-09-05 NOTE — Patient Instructions (Signed)
Return as needed.The patient is aware to call back for any questions or concerns.  

## 2018-09-05 NOTE — Progress Notes (Signed)
Surgical Clinic Progress/Follow-up Note   HPI:  63 y.o. Male presents to clinic for post-op follow-up 14 Days s/p excision of increasingly symptomatic Left neck sebaceous cyst Rosana Hoes, 08/22/2018). Patient reports complete resolution of pre-operative pain and has been tolerating regular diet with +flatus and normal BM's, denies N/V, fever/chills, CP, or SOB. He only mentions he looks forward to shaving around the operative site when the glue falls off. He also says his Left groin continues to feel good s/p hernia repair. He says he is more aware of the risks of smoking than previously and making effort to quit, but still smokes.  Review of Systems:  Constitutional: denies fever/chills  Respiratory: denies shortness of breath, wheezing  Cardiovascular: denies chest pain, palpitations  Gastrointestinal: denies abdominal pain, N/V, or diarrhea Skin: Denies any other rashes or skin discolorations except post-surgical wounds as per interval history  Vital Signs:  BP 118/74   Pulse 84   Temp 97.8 F (36.6 C) (Skin)   Resp 13   Ht 5\' 9"  (1.753 m)   Wt 161 lb (73 kg)   SpO2 98%   BMI 23.78 kg/m    Physical Exam:  Constitutional:  -- Normal body habitus  -- Awake, alert, and oriented x3  Pulmonary:  -- No crackles -- Equal breath sounds bilaterally -- Breathing non-labored at rest Cardiovascular:  -- S1, S2 present  -- No pericardial rubs  Musculoskeletal / Integumentary:  -- Wounds or skin discoloration: Left neck post-surgical wound well-approximated and minimally to non-tender with no surrounding erythema or drainage, small area unshaved around wound -- Extremities: B/L UE and LE FROM, hands and feet warm, no edema   Imaging: No new pertinent imaging available for review  Assessment:  63 y.o. yo Male with a problem list including...  Patient Active Problem List   Diagnosis Date Noted  . CLE (columnar lined esophagus)   . Gastric irritation   . Duodenum ulcer   . Personal  history of colonic polyps   . Polyp of sigmoid colon   . Diverticulosis of large intestine without diverticulitis   . Benign neoplasm of ascending colon   . Osteoarthritis of both hands 09/04/2017  . Chronic pain of left knee 09/04/2017  . Abnormal MRI, knee 09/04/2017  . 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 Lymphedema 07/07/2016  . Chronic hepatitis C without hepatic coma ([place]) 06/01/2016  . Lumbar herniated disc 11/24/2015  . Protein malnutrition ([place]) 11/11/2015  . Thrombocytopenia ([place]) 11/11/2015  . Hepatitis C antibody test positive 07/13/2015  . Chronic constipation 07/13/2015  . Abnormal immunological findings in specimens from other organs, systems and tissues 07/13/2015  . CN (constipation) 07/13/2015  . Benign hypertension 03/08/2015  . COPD, mild ([place]) 03/08/2015  . Dyslipidemia 03/08/2015  . Paranoid schizophrenia ([place]) 03/08/2015  . Acquired polyneuropathy 03/08/2015  . Restless leg 03/08/2015  . Tobacco abuse 03/08/2015  . Callus of foot 03/08/2015  . Claudication ([place]) 03/08/2015  . Chronic obstructive pulmonary disease ([place]) 03/08/2015  . Corn or callus 03/08/2015  . Essential (primary) hypertension 03/08/2015  . HLD (hyperlipidemia) 03/08/2015  . Peripheral vascular disease ([place]) 03/08/2015  . Polyneuropathy 03/08/2015  . Current tobacco use 03/08/2015  . Vitamin D deficiency 04/26/2009  . Avitaminosis D 04/26/2009    presents to clinic for post-op follow-up evaluation, doing well 14 Days s/p excision of increasingly symptomatic Left neck sebaceous cyst Rosana Hoes, 08/22/2018).  Plan:              - okay to submerge incisions under  water (baths, swimming) prn             - gradually resume all activities without restrictions over next 2 weeks             - apply sunblock particularly to incisions with sun exposure to reduce pigmentation of scars             - return to clinic as needed, instructed to call office if any questions or concerns  All of  the above recommendations were discussed with the patient, and all of patient's questions were answered to his expressed satisfaction.  -- Marilynne Drivers Rosana Hoes, MD, [place]: [facility] for exceptional care. Office: [phone]

## 2018-09-25 ENCOUNTER — Other Ambulatory Visit: Payer: Self-pay | Admitting: Family Medicine

## 2018-09-25 DIAGNOSIS — G629 Polyneuropathy, unspecified: Secondary | ICD-10-CM

## 2018-09-25 NOTE — Telephone Encounter (Signed)
Refill request for general medication: Gabapentin 300 mg  Last office visit: 08/06/2018  Last physical exam: 11/30/2017  Follow-ups on file. 10/07/2018

## 2018-09-30 ENCOUNTER — Other Ambulatory Visit: Payer: Self-pay | Admitting: Family Medicine

## 2018-09-30 DIAGNOSIS — I10 Essential (primary) hypertension: Secondary | ICD-10-CM

## 2018-09-30 NOTE — Telephone Encounter (Signed)
Refill request for Hypertension medication:  HCTZ 12.5 mg  Last office visit pertaining to hypertension: 09/02/2018  BP Readings from Last 3 Encounters:  09/05/18 118/74  08/22/18 (!) 173/74  08/08/18 132/70    Lab Results  Component Value Date   CREATININE 0.81 06/19/2018   BUN 22 06/19/2018   NA 139 06/19/2018   K 4.1 06/19/2018   CL 101 06/19/2018   CO2 30 06/19/2018   Follow-ups on file 10/07/2018

## 2018-10-01 ENCOUNTER — Other Ambulatory Visit: Payer: Self-pay | Admitting: Family Medicine

## 2018-10-01 DIAGNOSIS — M5440 Lumbago with sciatica, unspecified side: Principal | ICD-10-CM

## 2018-10-01 DIAGNOSIS — G8929 Other chronic pain: Secondary | ICD-10-CM

## 2018-10-01 NOTE — Telephone Encounter (Signed)
Refill request for general medication: Methocarbamol 750 mg  Last office visit: 08/06/2018  Last physical exam: 11/30/2017  Follow-ups on file. 10/07/2018

## 2018-10-03 ENCOUNTER — Ambulatory Visit
Admission: RE | Admit: 2018-10-03 | Discharge: 2018-10-03 | Disposition: A | Discharge status: Home / Self Care | Payer: PPO | Source: Ambulatory Visit | Attending: Student in an Organized Health Care Education/Training Program | Admitting: Student in an Organized Health Care Education/Training Program

## 2018-10-03 ENCOUNTER — Other Ambulatory Visit: Payer: Self-pay

## 2018-10-03 ENCOUNTER — Ambulatory Visit: Payer: PPO | Admitting: Student in an Organized Health Care Education/Training Program

## 2018-10-03 ENCOUNTER — Encounter: Payer: Self-pay | Admitting: Student in an Organized Health Care Education/Training Program

## 2018-10-03 VITALS — BP 154/78 | HR 66 | Temp 98.0°F | Resp 16 | Ht 71.0 in | Wt 161.0 lb

## 2018-10-03 DIAGNOSIS — M961 Postlaminectomy syndrome, not elsewhere classified: Secondary | ICD-10-CM | POA: Insufficient documentation

## 2018-10-03 DIAGNOSIS — B182 Chronic viral hepatitis C: Secondary | ICD-10-CM | POA: Insufficient documentation

## 2018-10-03 DIAGNOSIS — M545 Low back pain, unspecified: Secondary | ICD-10-CM

## 2018-10-03 DIAGNOSIS — M25562 Pain in left knee: Secondary | ICD-10-CM

## 2018-10-03 DIAGNOSIS — G8929 Other chronic pain: Secondary | ICD-10-CM

## 2018-10-03 DIAGNOSIS — G894 Chronic pain syndrome: Secondary | ICD-10-CM

## 2018-10-03 DIAGNOSIS — M533 Sacrococcygeal disorders, not elsewhere classified: Secondary | ICD-10-CM

## 2018-10-03 DIAGNOSIS — M5416 Radiculopathy, lumbar region: Secondary | ICD-10-CM

## 2018-10-03 DIAGNOSIS — E1142 Type 2 diabetes mellitus with diabetic polyneuropathy: Secondary | ICD-10-CM | POA: Insufficient documentation

## 2018-10-03 MED ORDER — DICLOFENAC SODIUM 75 MG PO TBEC: 75.0000 mg | DELAYED_RELEASE_TABLET | Freq: Two times a day (BID) | ORAL | 1 refills | Status: DC

## 2018-10-03 NOTE — Patient Instructions (Signed)
1. Increase Gabapentin to 600 mg three times a day as we discussed 2. Stop Mobic, Rx for Diclofenac 3. Lumbar MRI 4. Xray SI JOINT and left knee

## 2018-10-03 NOTE — Progress Notes (Signed)
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 06/19/2018                         CA Markers No results found for: CEA, CA125, LABCA2                      Endocrine Markers No results found for: TSH, FREET4, TESTOFREE, TESTOSTERONE, ESTRADIOL, ESTRADIOLPCT, ESTRADIOLFRE  Note: Lab results reviewed.  PFSH  Drug: Mr. Loconte  reports no history of drug use. Alcohol:  reports no history of alcohol use. Tobacco:  reports that he has been smoking cigarettes. He has a 69.00 pack-year smoking history. He has never used smokeless tobacco. Medical:  has a past medical history of Arthritis, Asthma, Benign neoplasm of ascending colon, Constipation, COPD (chronic obstructive pulmonary disease) ([place]), Diabetes mellitus without complication ([place]), Diverticulosis, Dyspnea, GERD (gastroesophageal reflux disease), Hepatitis C, History of kidney stones, Hyperlipidemia, Hypertension (06/10/2018), Left inguinal hernia,  Peripheral vascular disease ([place]), Restless legs, Schizoaffective disorder ([place]), Thrombocytopenia ([place]), and Vitamin D deficiency. Family: family history includes Colitis in his mother; Diabetes in his father and sister; Diverticulitis in his brother and mother; Heart disease in his father; Hypertension in his father and mother.  Past Surgical History:  Procedure Laterality Date  . BACK SURGERY    . COLONOSCOPY WITH PROPOFOL N/A 09/07/2017   Procedure: COLONOSCOPY WITH PROPOFOL;  Surgeon: Virgel Manifold, MD;  Location: [facility];  Service: Endoscopy;  Laterality: N/A;  . ESOPHAGOGASTRODUODENOSCOPY (EGD) WITH PROPOFOL N/A 12/07/2017   Procedure: ESOPHAGOGASTRODUODENOSCOPY (EGD) WITH PROPOFOL;  Surgeon: Virgel Manifold, MD;  Location: [facility];  Service: Endoscopy;  Laterality: N/A;  . FLEXIBLE SIGMOIDOSCOPY N/A 11/09/2017   Procedure: FLEXIBLE SIGMOIDOSCOPY;  Surgeon: Virgel Manifold, MD;  Location: [facility];  Service: Endoscopy;  Laterality: N/A;  . HERNIA REPAIR     inguinal  . INGUINAL HERNIA REPAIR Bilateral 06/21/2018   Procedure: LAPAROSCOPIC BILATERAL INGUINAL HERNIA REPAIR;  Surgeon: Vickie Epley, MD;  Location: [facility];  Service: General;  Laterality: Bilateral;  . L3 TO L5 LAMINECTOMY FOR DECOMPRESSION  07/17/2016   [facility]  . REPAIR OF CEREBROSPINAL FLUID LEAK N/A 09/08/2016   Procedure: Lumbar wound exploration, repair of pseudomenigocele;  Surgeon: Kevan Ny Ditty, MD;  Location: [place];  Service: Neurosurgery;  Laterality: N/A;  Lumbar wound exploration, repair of pseudomenigocele  . SINUSOTOMY     Active Ambulatory Problems    Diagnosis Date Noted  . Benign hypertension 03/08/2015  . COPD, mild ([place]) 03/08/2015  . Dyslipidemia 03/08/2015  . Paranoid schizophrenia ([place]) 03/08/2015  . Acquired polyneuropathy 03/08/2015  . Restless leg 03/08/2015  . Tobacco abuse 03/08/2015  . Vitamin D deficiency 04/26/2009  .  Callus of foot 03/08/2015  . Claudication ([place]) 03/08/2015  . Hepatitis C antibody test positive 07/13/2015  . Chronic constipation 07/13/2015  . Abnormal immunological findings in specimens from other organs, systems and tissues 07/13/2015  . Chronic obstructive pulmonary disease ([place]) 03/08/2015  . CN (constipation) 07/13/2015  . Corn or callus 03/08/2015  . Essential (primary) hypertension 03/08/2015  . HLD (hyperlipidemia) 03/08/2015  . Peripheral vascular disease ([place]) 03/08/2015  . Polyneuropathy 03/08/2015  . Current tobacco use 03/08/2015  . Avitaminosis D 04/26/2009  . Protein malnutrition ([place]) 11/11/2015  . Thrombocytopenia ([place]) 11/11/2015  . Lumbar herniated disc 11/24/2015  . Chronic hepatitis C without hepatic coma ([place]) 06/01/2016  . Lymphedema 07/07/2016  . 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 Polyp of sigmoid colon   . Diverticulosis of large intestine without diverticulitis   . Benign neoplasm of ascending colon   . Osteoarthritis of both hands 09/04/2017  . Chronic pain of left knee 09/04/2017  . Abnormal MRI, knee 09/04/2017  . Personal history of colonic polyps   . CLE (columnar lined esophagus)   . Gastric irritation   . Duodenum ulcer    Resolved Ambulatory Problems  Diagnosis Date Noted  . Encounter for screening colonoscopy   . Abdominal pain, epigastric   . Abdominal pain, left upper quadrant   . Left inguinal hernia    Past Medical History:  Diagnosis Date  . Arthritis   . Asthma   . Constipation   . COPD (chronic obstructive pulmonary disease) ([place])   . Diabetes mellitus without complication ([place])   . Diverticulosis   . Dyspnea   . GERD (gastroesophageal reflux disease)   . Hepatitis C   . History of kidney stones   . Hyperlipidemia   . Hypertension 06/10/2018  . Restless legs   . Schizoaffective disorder ([place])    Constitutional Exam  General appearance: Well nourished, well developed, and well hydrated. In no apparent  acute distress Vitals:   10/03/18 1402  BP: (!) 154/78  Pulse: 66  Resp: 16  Temp: 98 F (36.7 C)  TempSrc: Oral  SpO2: 100%  Weight: 161 lb (73 kg)  Height: '5\' 11"'  (1.803 m)   BMI Assessment: Estimated body mass index is 22.45 kg/m as calculated from the following:   Height as of this encounter: '5\' 11"'  (1.803 m).   Weight as of this encounter: 161 lb (73 kg).  BMI interpretation table: BMI level Category Range association with higher incidence of chronic pain  <18 kg/m2 Underweight   18.5-24.9 kg/m2 Ideal body weight   25-29.9 kg/m2 Overweight Increased incidence by 20%  30-34.9 kg/m2 Obese (Class I) Increased incidence by 68%  35-39.9 kg/m2 Severe obesity (Class II) Increased incidence by 136%  >40 kg/m2 Extreme obesity (Class III) Increased incidence by 254%   Patient's current BMI Ideal Body weight  Body mass index is 22.45 kg/m. Ideal body weight: 75.3 kg (166 lb 0.1 oz)   BMI Readings from Last 4 Encounters:  10/03/18 22.45 kg/m  09/05/18 23.78 kg/m  08/22/18 22.68 kg/m  08/08/18 22.73 kg/m   Wt Readings from Last 4 Encounters:  10/03/18 161 lb (73 kg)  09/05/18 161 lb (73 kg)  08/22/18 162 lb 9.6 oz (73.8 kg)  08/08/18 163 lb (73.9 kg)  Psych/Mental status: Alert, oriented x 3 (person, place, & time)       Eyes: PERLA Respiratory: No evidence of acute respiratory distress  Cervical Spine Area Exam  Skin & Axial Inspection: No masses, redness, edema, swelling, or associated skin lesions Alignment: Symmetrical Functional ROM: Unrestricted ROM      Stability: No instability detected Muscle Tone/Strength: Functionally intact. No obvious neuro-muscular anomalies detected. Sensory (Neurological): Unimpaired Palpation: No palpable anomalies              Upper Extremity (UE) Exam    Side: Right upper extremity  Side: Left upper extremity  Skin & Extremity Inspection: Skin color, temperature, and hair growth are WNL. No peripheral edema or cyanosis. No  masses, redness, swelling, asymmetry, or associated skin lesions. No contractures.  Skin & Extremity Inspection: Skin color, temperature, and hair growth are WNL. No peripheral edema or cyanosis. No masses, redness, swelling, asymmetry, or associated skin lesions. No contractures.  Functional ROM: Unrestricted ROM          Functional ROM: Unrestricted ROM          Muscle Tone/Strength: Functionally intact. No obvious neuro-muscular anomalies detected.  Muscle Tone/Strength: Functionally intact. No obvious neuro-muscular anomalies detected.  Sensory (Neurological): Unimpaired          Sensory (Neurological): Unimpaired          Palpation: No palpable anomalies  Palpation: No palpable anomalies              Provocative Test(s):  Phalen's test: deferred Tinel's test: deferred Apley's scratch test (touch opposite shoulder):  Action 1 (Across chest): deferred Action 2 (Overhead): deferred Action 3 (LB reach): deferred   Provocative Test(s):  Phalen's test: deferred Tinel's test: deferred Apley's scratch test (touch opposite shoulder):  Action 1 (Across chest): deferred Action 2 (Overhead): deferred Action 3 (LB reach): deferred    Thoracic Spine Area Exam  Skin & Axial Inspection: No masses, redness, or swelling Alignment: Symmetrical Functional ROM: Unrestricted ROM Stability: No instability detected Muscle Tone/Strength: Functionally intact. No obvious neuro-muscular anomalies detected. Sensory (Neurological): Unimpaired Muscle strength & Tone: No palpable anomalies  Lumbar Spine Area Exam  Skin & Axial Inspection: Well healed scar from previous spine surgery detected Alignment: Scoliosis detected Functional ROM: Decreased ROM affecting both sides Stability: No instability detected Muscle Tone/Strength: Functionally intact. No obvious neuro-muscular anomalies detected. Sensory (Neurological): Dermatomal pain pattern right greater than left, right L4, L5, S1  dermatome Palpation: No palpable anomalies       Provocative Tests: Hyperextension/rotation test: (+) bilaterally for facet joint pain. Lumbar quadrant test (Kemp's test): deferred today       Lateral bending test: (+) ipsilateral radicular pain, bilaterally. Positive for bilateral foraminal stenosis. Patrick's Maneuver: (+) for right-sided S-I arthralgia             FABER* test: (+) for right hip arthralgia             S-I anterior distraction/compression test: deferred today         S-I lateral compression test: deferred today         S-I Thigh-thrust test: deferred today         S-I Gaenslen's test: deferred today         *(Flexion, ABduction and External Rotation)  Gait & Posture Assessment  Ambulation: Unassisted Gait: Relatively normal for age and body habitus Posture: Difficulty standing up straight, due to pain   Lower Extremity Exam    Side: Right lower extremity  Side: Left lower extremity  Stability: No instability observed          Stability: No instability observed          Skin & Extremity Inspection: Skin color, temperature, and hair growth are WNL. No peripheral edema or cyanosis. No masses, redness, swelling, asymmetry, or associated skin lesions. No contractures.  Skin & Extremity Inspection: Skin color, temperature, and hair growth are WNL. No peripheral edema or cyanosis. No masses, redness, swelling, asymmetry, or associated skin lesions. No contractures.  Functional ROM: Decreased ROM for hip and knee joints          Functional ROM: Unrestricted ROM                  Muscle Tone/Strength: Functionally intact. No obvious neuro-muscular anomalies detected.  Muscle Tone/Strength: Functionally intact. No obvious neuro-muscular anomalies detected.  Sensory (Neurological): Dermatomal pain pattern and musculoskeletal        Sensory (Neurological): Arthropathic arthralgia        DTR: Patellar: 1+: trace Achilles: deferred today Plantar: deferred today  DTR: Patellar: 1+:  trace Achilles: deferred today Plantar: deferred today  Palpation: No palpable anomalies  Palpation: No palpable anomalies   Assessment  Primary Diagnosis & Pertinent Problem List: The primary encounter diagnosis was Lumbar post-laminectomy syndrome. Diagnoses of Chronic pain syndrome, Diabetic polyneuropathy associated with type 2 diabetes mellitus ([place]), SI (sacroiliac)  joint dysfunction, Lumbar spine pain, Lumbar radiculopathy, and Chronic pain of left knee were also pertinent to this visit.  Visit Diagnosis (New problems to examiner): 1. Lumbar post-laminectomy syndrome   2. Chronic pain syndrome   3. Diabetic polyneuropathy associated with type 2 diabetes mellitus (HCC)   4. SI (sacroiliac) joint dysfunction   5. Lumbar spine pain   6. Lumbar radiculopathy   7. Chronic pain of left knee     Patient is a very pleasant 63 year old male who presents with a chief complaint of axial low back pain with radiation to bilateral lower extremities, right greater than left.  Patient does have a history of L3-L5 laminectomy complicated by pseudo-myelo meningocele that was repaired surgically after the complication.  Patient also has type 2 diabetes, is currently on insulin.  Has been told that he has diabetic neuropathy.  In regards to the patient's worsening axial low back pain with radiation to his lower extremities, this likely has a radicular component and is related to postlaminectomy pain syndrome.  It has been almost 3 years since the patient's last lumbar MRI.  I would like to repeat to evaluate for neuroforaminal stenosis, canal stenosis, facet arthropathy.  Since the patient is also endorsing right groin pain, this could be suggestive of SI joint arthropathy.  We will obtain x-rays of the patient's bilateral SI joints.  I have instructed the patient to increase his gabapentin from 300 mg 3 times daily to 600 mg 3 times daily in a graduated fashion.  Patient recently failed gabapentin 300  mg capsules to states that he does not need a new prescription.  I also recommend the patient discontinue his meloxicam and trial diclofenac 75 mg twice daily.  Kidney function within normal limits.  Patient instructed to discontinue and refrain from all other NSAIDs while on diclofenac.  Plan: -UDS today -MRI of lumbar spine given history of L3-L5 laminectomy, concern for lumbar postlaminectomy pain syndrome (previous one 3 years ago) -Bilateral SI joint x-ray to evaluate SI joint arthropathy given right greater than left hip and groin pain -Left knee x-ray -Prescription for Voltaren as below.  Patient instructed to discontinue meloxicam and all other anti-inflammatories.   Note: Please be advised that as per protocol, today's visit has been an evaluation only. We have not taken over the patient's controlled substance management.  Ordered Lab-work, Procedure(s), Referral(s), & Consult(s): Orders Placed This Encounter  Procedures  . MR LUMBAR SPINE WO CONTRAST  . DG Si Joints  . DG Knee 1-2 Views Left  . Compliance Drug Analysis, Ur   Pharmacotherapy (current): Medications ordered:  Meds ordered this encounter  Medications  . diclofenac (VOLTAREN) 75 MG EC tablet    Sig: Take 1 tablet (75 mg total) by mouth 2 (two) times daily.    Dispense:  60 tablet    Refill:  1   Medications administered during this visit: Pierce Crane. Aug[place]augh had no medications administered during this visit.   Pharmacological management options:  Opioid Analgesics: The patient was informed that there is no guarantee that he would be a candidate for opioid analgesics. The decision will be made following CDC guidelines. This decision will be based on the results of diagnostic studies, as well as Mr. Burrowes's risk profile.   Membrane stabilizer: Lyrica, Cymbalta  Muscle relaxant: Tizanidine, baclofen, Robaxin  NSAID: Has tried meloxicam, diclofenac.  Retrial diclofenac as patient found this helpful.  Other  analgesic(s): To be determined at a later time   Interventional management options:  Mr. Galik was informed that there is no guarantee that he would be a candidate for interventional therapies. The decision will be based on the results of diagnostic studies, as well as Mr. Matters's risk profile.  Procedure(s) under consideration:  Lumbar facet medial branch nerve blocks Lumbar transforaminal epidural steroid injection Caudal ESI Bilateral SI joint injection Sacral lateral branch block Left knee genicular nerve block Left intra-articular knee steroid injection   Provider-requested follow-up: Return in about 4 weeks (around 10/31/2018) for After Imaging.  Future Appointments  Date Time Provider Speculator  10/07/2018  1:20 PM [name], MD [facility]  10/08/2018 11:00 AM [facility] [place]  11/05/2018 12:00 PM Gillis Santa, MD [facility] None  12/06/2018  9:40 AM [place] ADVISOR [place]    Primary Care Physician: [name], MD Location: [facility] Note by: Gillis Santa, M.D, Date: 10/03/2018; Time: 3:03 PM  Patient Instructions  1. Increase Gabapentin to 600 mg three times a day as we discussed 2. Stop Mobic, Rx for Diclofenac 3. Lumbar MRI 4. Xray SI JOINT and left knee

## 2018-10-03 NOTE — Progress Notes (Signed)
Safety precautions to be maintained throughout the outpatient stay will include: orient to surroundings, keep bed in low position, maintain call bell within reach at all times, provide assistance with transfer out of bed and ambulation.  

## 2018-10-07 ENCOUNTER — Ambulatory Visit: Payer: PPO | Admitting: Family Medicine

## 2018-10-08 ENCOUNTER — Ambulatory Visit
Admission: RE | Admit: 2018-10-08 | Discharge: 2018-10-08 | Disposition: A | Discharge status: Home / Self Care | Payer: PPO | Source: Ambulatory Visit | Attending: Student in an Organized Health Care Education/Training Program | Admitting: Student in an Organized Health Care Education/Training Program

## 2018-10-08 DIAGNOSIS — M48061 Spinal stenosis, lumbar region without neurogenic claudication: Secondary | ICD-10-CM | POA: Diagnosis not present

## 2018-10-08 DIAGNOSIS — M5416 Radiculopathy, lumbar region: Secondary | ICD-10-CM | POA: Diagnosis not present

## 2018-10-08 DIAGNOSIS — M961 Postlaminectomy syndrome, not elsewhere classified: Secondary | ICD-10-CM

## 2018-10-08 LAB — COMPLIANCE DRUG ANALYSIS, UR

## 2018-10-10 ENCOUNTER — Ambulatory Visit (INDEPENDENT_AMBULATORY_CARE_PROVIDER_SITE_OTHER): Payer: PPO | Admitting: Family Medicine

## 2018-10-10 ENCOUNTER — Encounter: Payer: Self-pay | Admitting: *Deleted

## 2018-10-10 ENCOUNTER — Encounter: Payer: Self-pay | Admitting: Family Medicine

## 2018-10-10 VITALS — BP 140/80 | HR 98 | Temp 97.6°F | Resp 16 | Ht 71.0 in | Wt 161.0 lb

## 2018-10-10 DIAGNOSIS — Z9889 Other specified postprocedural states: Secondary | ICD-10-CM

## 2018-10-10 DIAGNOSIS — E118 Type 2 diabetes mellitus with unspecified complications: Secondary | ICD-10-CM

## 2018-10-10 DIAGNOSIS — Z86010 Personal history of colonic polyps: Secondary | ICD-10-CM

## 2018-10-10 DIAGNOSIS — F200 Paranoid schizophrenia: Secondary | ICD-10-CM

## 2018-10-10 DIAGNOSIS — B182 Chronic viral hepatitis C: Secondary | ICD-10-CM

## 2018-10-10 DIAGNOSIS — E1142 Type 2 diabetes mellitus with diabetic polyneuropathy: Secondary | ICD-10-CM

## 2018-10-10 DIAGNOSIS — J449 Chronic obstructive pulmonary disease, unspecified: Secondary | ICD-10-CM | POA: Diagnosis not present

## 2018-10-10 DIAGNOSIS — E785 Hyperlipidemia, unspecified: Secondary | ICD-10-CM | POA: Diagnosis not present

## 2018-10-10 DIAGNOSIS — I739 Peripheral vascular disease, unspecified: Secondary | ICD-10-CM | POA: Diagnosis not present

## 2018-10-10 DIAGNOSIS — D692 Other nonthrombocytopenic purpura: Secondary | ICD-10-CM

## 2018-10-10 DIAGNOSIS — Z794 Long term (current) use of insulin: Secondary | ICD-10-CM

## 2018-10-10 DIAGNOSIS — I10 Essential (primary) hypertension: Secondary | ICD-10-CM

## 2018-10-10 LAB — POCT GLYCOSYLATED HEMOGLOBIN (HGB A1C): Hemoglobin A1C: 7.1 % — AB (ref 4.0–5.6)

## 2018-10-10 MED ORDER — LISINOPRIL 40 MG PO TABS: 40.0000 mg | ORAL_TABLET | Freq: Every day | ORAL | 1 refills | Status: DC

## 2018-10-10 NOTE — Progress Notes (Signed)
Name: Mark Nash   MRN: [id number]    DOB: 05/06/1956   Date:10/10/2018       Progress Note  Subjective  Chief Complaint  Chief Complaint  Patient presents with  . Diabetes  . Hypertension  . COPD  . Hyperlipidemia    HPI  Lumbar radiculitis: he had back surgery in 06/2016 and 08/2016. He is currently doing well, states uses heating pad and is taking gabapentin and Robaxin and seems to be working for him. He is now seeing Dr. Holley Raring, has some other joint pains also from OA and has pain on both legs also  DMII: heis still on Levemir4 units daily , hgbA1C was 6.8% , 6.0,6.7%,6.8%, , 6.3% , 7%, 6.8% and today 7.1%, he wants to bring it down, advised to resume daily walks. He has episodes of  Polydipsia associated polyuria but no  polyphagia.He is on ace and a statin, and has neuropathy. Pain from neuropathy still intense, taking Gabapentin to tid and states Dr. Holley Raring is adjusting dose of gabapentin now.He has not been checking glucose lately.   PAD: edema resolved, on statin therapy, cannot tolerate aspirin, still has pain on legs when he walks more than 10 minutes and has to stop and pause, seen by vascular surgeon in the past but unable to find notes today   COPD: he is not on medication, because he refuses, healways hasdaily productive cough in am's.Hehas some SOB when he starts walking but resolves within a few minutes - he takes deeps breaths to improve symptoms.. Heis still smoking about 1 and half pack of cigarettes daily but not ready to quit smoking yet. He used to smoke 2 packs daily. Discussed inhalers but he is not interested.  Hyperlipidemia: taking Pravastatin, only half pill at night, sent 20 mg this time, so he will take one daily Last lipid pane June 2019 and we will repeat on his next visit   HTN: taking bp medication and denies side effects, bp is 140/80 today and elevated during recent visit to pain clinic we will adjust dose of lisinopril to 40  mg daily and continue HCTZ. Denies chest pain or palpitation   Schizophrenia: denies any hallucinations, no depressed mood or suicidal thoughts or ideation, feeling well, not on medication and refuses to see Psychiatrist. He states he has been feeling a little tired otherwise feeling well.   Hepatitis C antibody positive: he has a remote history of drug use from age 62 till 64. Used injectable drug use at the time, he has positive hepatitis C , he went to see GI but did not discuss hepatitis C.  Placing another referral explained treatment is much better tolerated now  Chronic Constipation: he has intermittent periods of constipation. Lately [place] type 4 , He was on Amitiza but had episodes of explosive diarrhea, and was advised by GI to change to Miralax. He had EGD and colonoscopy, past due for repeat testing secondary to large sigmoid polyp, we will place referral today   Left knee pain: seen by Ortho and also Rheumatologist, unable to tolerate tylenol, and also told he has osteoarthritis, he states pain on left knee is intermittent and usually triggered by activity, better with rest also with diclofenac given by pan clinic. Pain is described as aching  Senile purpura: both arms. Stable  Patient Active Problem List   Diagnosis Date Noted  . CLE (columnar lined esophagus)   . Gastric irritation   . Duodenum ulcer   . Personal history of  colonic polyps   . Polyp of sigmoid colon   . Diverticulosis of large intestine without diverticulitis   . Benign neoplasm of ascending colon   . Osteoarthritis of both hands 09/04/2017  . Chronic pain of left knee 09/04/2017  . Abnormal MRI, knee 09/04/2017  . 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 Lymphedema 07/07/2016  . Chronic hepatitis C without hepatic coma ([place]) 06/01/2016  . Lumbar herniated disc 11/24/2015  . Thrombocytopenia ([place]) 11/11/2015  . Hepatitis C antibody test positive 07/13/2015  . Chronic constipation 07/13/2015   . Abnormal immunological findings in specimens from other organs, systems and tissues 07/13/2015  . CN (constipation) 07/13/2015  . Benign hypertension 03/08/2015  . COPD, mild ([place]) 03/08/2015  . Dyslipidemia 03/08/2015  . Paranoid schizophrenia ([place]) 03/08/2015  . Acquired polyneuropathy 03/08/2015  . Restless leg 03/08/2015  . Tobacco abuse 03/08/2015  . Callus of foot 03/08/2015  . Claudication ([place]) 03/08/2015  . Chronic obstructive pulmonary disease ([place]) 03/08/2015  . Corn or callus 03/08/2015  . Essential (primary) hypertension 03/08/2015  . HLD (hyperlipidemia) 03/08/2015  . Peripheral vascular disease ([place]) 03/08/2015  . Polyneuropathy 03/08/2015  . Current tobacco use 03/08/2015  . Vitamin D deficiency 04/26/2009  . Avitaminosis D 04/26/2009    Past Surgical History:  Procedure Laterality Date  . BACK SURGERY    . COLONOSCOPY WITH PROPOFOL N/A 09/07/2017   Procedure: COLONOSCOPY WITH PROPOFOL;  Surgeon: Virgel Manifold, MD;  Location: [facility];  Service: Endoscopy;  Laterality: N/A;  . ESOPHAGOGASTRODUODENOSCOPY (EGD) WITH PROPOFOL N/A 12/07/2017   Procedure: ESOPHAGOGASTRODUODENOSCOPY (EGD) WITH PROPOFOL;  Surgeon: Virgel Manifold, MD;  Location: [facility] END[facility];  Service: Endoscopy;  Laterality: N/A;  . FLEXIBLE SIGMOIDOSCOPY N/A 11/09/2017   Procedure: FLEXIBLE SIGMOIDOSCOPY;  Surgeon: Virgel Manifold, MD;  Location: [facility];  Service: Endoscopy;  Laterality: N/A;  . HERNIA REPAIR     inguinal  . INGUINAL HERNIA REPAIR Bilateral 06/21/2018   Procedure: LAPAROSCOPIC BILATERAL INGUINAL HERNIA REPAIR;  Surgeon: Vickie Epley, MD;  Location: [facility];  Service: General;  Laterality: Bilateral;  . L3 TO L5 LAMINECTOMY FOR DECOMPRESSION  07/17/2016    NEUROSURGERY AND SPINE  . REPAIR OF CEREBROSPINAL FLUID LEAK N/A 09/08/2016   Procedure: Lumbar wound exploration, repair of pseudomenigocele;  Surgeon: Kevan Ny Ditty, MD;   Location: [facility];  Service: Neurosurgery;  Laterality: N/A;  Lumbar wound exploration, repair of pseudomenigocele  . SINUSOTOMY      Family History  Problem Relation Age of Onset  . Hypertension Mother   . Diverticulitis Mother   . Colitis Mother   . Diabetes Father   . Hypertension Father   . Heart disease Father   . Diverticulitis Brother   . Diabetes Sister     Social History   Socioeconomic History  . Marital status: Divorced    Spouse name: Not on file  . Number of children: 2  . Years of education: Not on file  . Highest education level: 12th grade  Occupational History  . Occupation: Disabled  Social Needs  . Financial resource strain: Not hard at all  . Food insecurity:    Worry: Never true    Inability: Never true  . Transportation needs:    Medical: No    Non-medical: No  Tobacco Use  . Smoking status: Current Every Day Smoker    Packs/day: 1.50    Years: 46.00    Pack years: 69.00    Types: Cigarettes  . Smokeless tobacco: Never Used  Substance and Sexual Activity  . Alcohol use: No    Alcohol/week: 0.0 standard drinks  . Drug use: No  . Sexual activity: Not Currently  Lifestyle  . Physical activity:    Days per week: 1 day    Minutes per session: 20 min  . Stress: Not at all  Relationships  . Social connections:    Talks on phone: Three times a week    Gets together: Three times a week    Attends religious service: Never    Active member of club or organization: No    Attends meetings of clubs or organizations: Never    Relationship status: Divorced  . Intimate partner violence:    Fear of current or ex partner: No    Emotionally abused: No    Physically abused: No    Forced sexual activity: No  Other Topics Concern  . Not on file  Social History Narrative   Lives alone     Current Outpatient Medications:  .  diclofenac (VOLTAREN) 75 MG EC tablet, Take 1 tablet (75 mg total) by mouth 2 (two) times daily., Disp: 60 tablet, Rfl: 1 .   gabapentin (NEURONTIN) 300 MG capsule, TAKE 1 CAPSULE BY MOUTH THREE TIMES DAILY, Disp: 270 capsule, Rfl: 1 .  hydrochlorothiazide (HYDRODIURIL) 12.5 MG tablet, TAKE 1 TABLET BY MOUTH ONCE DAILY, Disp: 90 tablet, Rfl: 1 .  LEVEMIR 100 UNIT/ML injection, INJECT 5 UNITS SUBCUTANEOUSLY ONCE DAILY IN THE EVENING (Patient taking differently: Inject 4 Units into the skin daily at 3 pm. ), Disp: 10 mL, Rfl: 0 .  lisinopril (PRINIVIL,ZESTRIL) 40 MG tablet, Take 1 tablet (40 mg total) by mouth daily., Disp: 90 tablet, Rfl: 1 .  methocarbamol (ROBAXIN) 750 MG tablet, TAKE 1 TABLET BY MOUTH EVERY 8 HOURS AS NEEDED FOR MUSCLE SPASM, Disp: 90 tablet, Rfl: 0 .  pravastatin (PRAVACHOL) 20 MG tablet, Take 1 tablet (20 mg total) by mouth daily. (Patient taking differently: Take 20 mg by mouth daily with supper. ), Disp: 90 tablet, Rfl: 1 .  Vitamin D, Ergocalciferol, (DRISDOL) 50000 units CAPS capsule, Take 1 capsule (50,000 Units total) by mouth every 7 (seven) days. (Patient taking differently: Take 50,000 Units by mouth every 14 (fourteen) days. ), Disp: 12 capsule, Rfl: 1  Allergies  Allergen Reactions  . Penicillins Hives and Swelling    As a child. Has patient had a PCN reaction causing immediate rash, facial/tongue/throat swelling, SOB or lightheadedness with hypotension: YES  Has patient had a PCN reaction causing severe rash involving mucus membranes or skin necrosis: UNKNOWN Has patient had a PCN reaction that required hospitalization: UNKNOWN Has patient had a PCN reaction occurring within the last 10 years: NO  . Aspirin Nausea Only  . Ibuprofen Nausea Only  . Lyrica [Pregabalin] Nausea Only  . Acetaminophen Nausea Only    I personally reviewed active problem list, medication list, allergies, family history, social history with the patient/caregiver today.   ROS  Constitutional: Negative for fever or weight change.  Respiratory: Negative for cough and shortness of breath.   Cardiovascular:  Negative for chest pain or palpitations.  Gastrointestinal: Negative for abdominal pain, no bowel changes.  Musculoskeletal: Negative for gait problem or joint swelling.  Skin: Negative for rash.  Neurological: Negative for dizziness or headache.  No other specific complaints in a complete review of systems (except as listed in HPI above).  Objective  Vitals:   10/10/18 1006  BP: 140/80  Pulse: 98  Resp: 16  Temp: 97.6 F (36.4 C)  TempSrc: Oral  SpO2: 95%  Weight: 161 lb (73 kg)  Height: 5\' 11"  (1.803 m)    Body mass index is 22.45 kg/m.  Physical Exam  Constitutional: Patient appears well-developed and well-nourished. No distress.  HEENT: head atraumatic, normocephalic, pupils equal and reactive to light,neck supple, throat within normal limits Cardiovascular: Normal rate, regular rhythm and normal heart sounds.  No murmur heard. No BLE edema. Pulmonary/Chest: Effort normal and breath sounds normal. No respiratory distress. Abdominal: Soft.  There is no tenderness. Skin: no hair on legs, some hyperpigmentation from scarring and also erythema Psychiatric: Patient has a normal mood and affect. behavior is normal. Judgment and thought content normal.  Recent Results (from the past 2160 hour(s))  Compliance Drug Analysis, Ur     Status: None   Collection Time: 10/03/18  3:49 PM  Result Value Ref Range   Summary FINAL     Comment: ==================================================================== [facility] COMP DRUG ANALYSIS,UR ==================================================================== Test                             Result       Flag       Units Drug Present and Declared for Prescription Verification   Gabapentin                     PRESENT      EXPECTED   Methocarbamol                  PRESENT      EXPECTED Drug Present not Declared for Prescription Verification   Oxazepam                       53           UNEXPECTED ng/mg creat    Oxazepam may be  administered as a scheduled prescription    medication; it is also an expected metabolite of other    benzodiazepine drugs, including diazepam, chlordiazepoxide,    prazepam, clorazepate, halazepam, and temazepam. Drug Absent but Declared for Prescription Verification   Diclofenac                     Not Detected UNEXPECTED    Topical diclofenac, as indicated in the declared medication list,    is not always detected even when used as di rected. ==================================================================== Test                      Result    Flag   Units      Ref Range   Creatinine              86               mg/dL      >=20 ==================================================================== Declared Medications:  The flagging and interpretation on this report are based on the  following declared medications.  Unexpected results may arise from  inaccuracies in the declared medications.  **Note: The testing scope of this panel includes these medications:  Gabapentin (Neurontin)  Methocarbamol (Robaxin)  **Note: The testing scope of this panel does not include small to  moderate amounts of these reported medications:  Topical Diclofenac (Voltaren)  **Note: The testing scope of this panel does not include following  reported medications:  Hydrochlorothiazide (Hydrodiuril)  Insulin (Levemir)  Lisinopril  Pravastatin (Pravachol)  Vitamin D2 (Drisdol) ========================================== ==========================  For clinical consultation, please call [phone]. ====================================================================   POCT HgB A1C     Status: Abnormal   Collection Time: 10/10/18 10:44 AM  Result Value Ref Range   Hemoglobin A1C 7.1 (A) 4.0 - 5.6 %   HbA1c POC (<> result, manual entry)     HbA1c, POC (prediabetic range)     HbA1c, POC (controlled diabetic range)       PHQ2/9: Depression screen [facility] 2/9 10/03/2018 06/10/2018 05/29/2018 02/19/2018  11/30/2017  Decreased Interest 0 0 0 0 0  Down, Depressed, Hopeless 0 0 0 0 0  PHQ - 2 Score 0 0 0 0 0  Altered sleeping - 0 0 0 0  Tired, decreased energy - 0 0 0 0  Change in appetite - 0 0 0 0  Feeling bad or failure about yourself  - 0 0 0 0  Trouble concentrating - 0 0 0 0  Moving slowly or fidgety/restless - 0 0 0 0  Suicidal thoughts - 0 0 0 0  PHQ-9 Score - 0 0 - 0  Difficult doing work/chores - - - Not difficult at all Not difficult at all     Fall Risk: Fall Risk  10/03/2018 09/05/2018 08/22/2018 08/08/2018 08/06/2018  Falls in the past year? 0 0 0 0 0  Risk for fall due to : - - - - -     Assessment & Plan  1. Type 2 diabetes mellitus with complication, with long-term current use of insulin (HCC)  - POCT HgB A1C  2. Claudication ([place])   3. Benign hypertension  bp improved, but not at goal, we will increase lisinopril to 40 mg daily   4. Paranoid schizophrenia ([place])  Stable, refuses medication  5. COPD, mild ([place])  Still smoking   6. Type 2 diabetes mellitus with diabetic polyneuropathy, with long-term current use of insulin ([place])   7. Peripheral vascular disease (HCC)  Stable , on statin therapy, but unable to tolerate aspirin   8. Chronic hepatitis C without hepatic coma ([place])  - Ambulatory referral to Gastroenterology  9. Purpura, nonthrombocytopenic (HCC)  Stable on both arms   10. Dyslipidemia  States still has medication at home   11. History of colonoscopy with polypectomy  - Ambulatory referral to Gastroenterology Explained importance of follow up to make sure not colon cancer

## 2018-10-24 ENCOUNTER — Other Ambulatory Visit: Payer: Self-pay | Admitting: Family Medicine

## 2018-10-24 DIAGNOSIS — E1140 Type 2 diabetes mellitus with diabetic neuropathy, unspecified: Secondary | ICD-10-CM

## 2018-10-28 ENCOUNTER — Other Ambulatory Visit: Payer: Self-pay | Admitting: Family Medicine

## 2018-10-28 DIAGNOSIS — M5440 Lumbago with sciatica, unspecified side: Principal | ICD-10-CM

## 2018-10-28 DIAGNOSIS — G8929 Other chronic pain: Secondary | ICD-10-CM

## 2018-11-05 ENCOUNTER — Other Ambulatory Visit: Payer: Self-pay

## 2018-11-05 ENCOUNTER — Encounter: Payer: Self-pay | Admitting: Student in an Organized Health Care Education/Training Program

## 2018-11-05 ENCOUNTER — Ambulatory Visit
Payer: PPO | Attending: Student in an Organized Health Care Education/Training Program | Admitting: Student in an Organized Health Care Education/Training Program

## 2018-11-05 VITALS — BP 151/79 | HR 70 | Temp 98.5°F | Resp 16 | Ht 71.0 in | Wt 162.0 lb

## 2018-11-05 DIAGNOSIS — B182 Chronic viral hepatitis C: Secondary | ICD-10-CM | POA: Insufficient documentation

## 2018-11-05 DIAGNOSIS — G894 Chronic pain syndrome: Secondary | ICD-10-CM | POA: Insufficient documentation

## 2018-11-05 DIAGNOSIS — M5416 Radiculopathy, lumbar region: Secondary | ICD-10-CM | POA: Insufficient documentation

## 2018-11-05 DIAGNOSIS — M961 Postlaminectomy syndrome, not elsewhere classified: Secondary | ICD-10-CM | POA: Insufficient documentation

## 2018-11-05 DIAGNOSIS — E1142 Type 2 diabetes mellitus with diabetic polyneuropathy: Secondary | ICD-10-CM | POA: Diagnosis not present

## 2018-11-05 NOTE — Progress Notes (Signed)
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 interpretation table: BMI level Category Range association with higher incidence of chronic pain  <18 kg/m2 Underweight   18.5-24.9 kg/m2 Ideal body weight   25-29.9 kg/m2 Overweight Increased incidence by 20%  30-34.9 kg/m2 Obese (Class I) Increased incidence by 68%  35-39.9 kg/m2 Severe obesity (Class II) Increased incidence by 136%  >40 kg/m2 Extreme obesity (Class III) Increased incidence by 254%   Patient's current BMI Ideal Body weight  Body mass index is 22.59 kg/m. Ideal body weight: 75.3 kg (166 lb 0.1 oz)   BMI Readings from Last 4 Encounters:  11/05/18 22.59 kg/m  10/10/18 22.45 kg/m  10/03/18 22.45 kg/m  09/05/18 23.78 kg/m   Wt Readings from Last 4 Encounters:  11/05/18 162 lb (73.5 kg)  10/10/18 161 lb (73 kg)  10/03/18 161 lb (73 kg)  09/05/18 161 lb (73 kg)  Psych/Mental status:  Alert, oriented x 3 (person, place, & time)       Eyes: PERLA Respiratory: No evidence of acute respiratory distress  Cervical Spine Area Exam  Skin & Axial Inspection: No masses, redness, edema, swelling, or associated skin lesions Alignment: Symmetrical Functional ROM: Unrestricted ROM      Stability: No instability detected Muscle Tone/Strength: Functionally intact. No obvious neuro-muscular anomalies detected. Sensory (Neurological): Unimpaired Palpation: No palpable anomalies              Upper Extremity (UE) Exam    Side: Right upper extremity  Side: Left upper extremity  Skin & Extremity Inspection: Skin color, temperature, and hair growth are WNL. No peripheral edema or cyanosis. No masses, redness, swelling, asymmetry, or associated skin lesions. No contractures.  Skin & Extremity Inspection: Skin color, temperature, and hair growth are WNL. No peripheral edema or cyanosis. No masses, redness, swelling, asymmetry, or associated skin lesions. No contractures.  Functional ROM: Unrestricted ROM          Functional ROM: Unrestricted ROM          Muscle Tone/Strength: Functionally intact. No obvious neuro-muscular anomalies detected.  Muscle Tone/Strength: Functionally intact. No obvious neuro-muscular anomalies detected.  Sensory (Neurological): Unimpaired          Sensory (Neurological): Unimpaired          Palpation: No palpable anomalies              Palpation: No palpable anomalies              Provocative Test(s):  Phalen's test: deferred Tinel's test: deferred Apley's scratch test (touch opposite shoulder):  Action 1 (Across chest): deferred Action 2 (Overhead): deferred Action 3 (LB reach): deferred   Provocative Test(s):  Phalen's test: deferred Tinel's test: deferred Apley's scratch test (touch opposite shoulder):  Action 1 (Across chest): deferred Action 2 (Overhead): deferred Action 3 (LB reach): deferred    Thoracic Spine Area Exam  Skin & Axial Inspection: No  masses, redness, or swelling Alignment: Symmetrical Functional ROM: Unrestricted ROM Stability: No instability detected Muscle Tone/Strength: Functionally intact. No obvious neuro-muscular anomalies detected. Sensory (Neurological): Unimpaired Muscle strength & Tone: No palpable anomalies  Lumbar Spine Area Exam  Skin & Axial Inspection: Well healed  scar from previous spine surgery detected Alignment: Asymmetric Functional ROM: Decreased ROM       Stability: No instability detected Muscle Tone/Strength: Functionally intact. No obvious neuro-muscular anomalies detected. Sensory (Neurological): Dermatomal pain pattern Palpation: No palpable anomalies       Provocative Tests: Hyperextension/rotation test: no change from prior assessment       Lumbar quadrant test (Kemp's test): no change from prior assessment       Lateral bending test: (+) ipsilateral radicular pain, on the right. Positive for right-sided foraminal stenosis. Patrick's Maneuver: (+) for bilateral S-I arthralgia             FABER* test: deferred today                   S-I anterior distraction/compression test: deferred today         S-I lateral compression test: deferred today         S-I Thigh-thrust test: deferred today         S-I Gaenslen's test: deferred today         *(Flexion, ABduction and External Rotation)  Gait & Posture Assessment  Ambulation: Limited Gait: Relatively normal for age and body habitus Posture: Difficulty standing up straight, due to pain   Lower Extremity Exam    Side: Right lower extremity  Side: Left lower extremity  Stability: No instability observed          Stability: No instability observed          Skin & Extremity Inspection: Skin color, temperature, and hair growth are WNL. No peripheral edema or cyanosis. No masses, redness, swelling, asymmetry, or associated skin lesions. No contractures.  Skin & Extremity Inspection: Skin color, temperature, and hair growth are WNL. No  peripheral edema or cyanosis. No masses, redness, swelling, asymmetry, or associated skin lesions. No contractures.  Functional ROM: Decreased ROM for hip and knee joints          Functional ROM: Decreased ROM for hip and knee joints not as bad as right          Muscle Tone/Strength: Functionally intact. No obvious neuro-muscular anomalies detected.  Muscle Tone/Strength: Functionally intact. No obvious neuro-muscular anomalies detected.  Sensory (Neurological): Dermatomal pain pattern        Sensory (Neurological): Unimpaired        DTR: Patellar: 1+: trace Achilles: deferred today Plantar: deferred today  DTR: Patellar: 1+: trace Achilles: deferred today Plantar: deferred today  Palpation: No palpable anomalies  Palpation: No palpable anomalies   Assessment & Plan  Primary Diagnosis & Pertinent Problem List: The primary encounter diagnosis was Lumbar radiculopathy. Diagnoses of Lumbar post-laminectomy syndrome, Diabetic polyneuropathy associated with type 2 diabetes mellitus ([place]), Chronic hepatitis C without hepatic coma ([place]), and Chronic pain syndrome were also pertinent to this visit.  Visit Diagnosis: 1. Lumbar radiculopathy   2. Lumbar post-laminectomy syndrome   3. Diabetic polyneuropathy associated with type 2 diabetes mellitus ([place])   4. Chronic hepatitis C without hepatic coma ([place])   5. Chronic pain syndrome    Problems updated and reviewed during this visit: Problem  Lumbar Radiculopathy  Lumbar Post-Laminectomy Syndrome  Diabetic Polyneuropathy Associated With Type 2 Diabetes Mellitus ([facility])   Patient is a very pleasant 63 year old male who presents with a chief complaint of axial low back pain with radiation to bilateral lower extremities, right greater than left.  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  Patient also has type 2 diabetes, is currently on insulin.  Has  been told that he has diabetic neuropathy.  At the last visit, patient was instructed to titrate gabapentin up to 600 mg 3 times a day.  Patient endorses side effects and difficulty sleeping at 3 times a day.  He was not having the side effects at twice daily dosing.  I instructed the patient to decrease his gabapentin back to twice daily.  We also reviewed the patient's SI joint x-rays and knee x-ray which were largely unremarkable for osteoarthritis and degenerative changes.  We also reviewed the patient's lumbar MRI.  Patient does have a new left paracentral disc protrusion at L1-L2 along with bilateral recess encroachment at L2-L3 and new shallow left paracentral disc protrusion with mild impression on the thecal sac at L2-L3.  Patient continues to show mild bilateral recess stenosis and foraminal encroachment at L3-L4.  Lumbar MRI shows wide decompressive laminectomy at L4-L5.  L5-S1 no significant pathology.  In light of his most recent imaging studies, the majority of the patient's pain is largely radicular and related to his postlaminectomy pain syndrome.  We discussed interlaminar epidural steroid injection at L2-L3 targeting the right side.  Risks and benefits of this procedure were discussed especially the context of the patient's type 2 diabetes on insulin.  I informed the patient of steroid therapy in the context of type 2 diabetes and how it could increase his blood pressures, increased lower extremity swelling, increased blood sugars temporarily.  Patient informed and aware of risk and is willing to proceed.  We also discussed possible lumbar spinal cord stimulation for postlaminectomy pain syndrome.  If lumbar ESI not effective, can consider in the future.  Plan: -Reduce gabapentin to 600 mg twice daily.  Future considerations could include Lyrica or Cymbalta. -Right L2-L3 lumbar epidural steroid injection for lumbar radicular symptoms -Consideration of lumbar spinal cord  stimulation  Plan of Care  Lab-work, procedure(s), and/or referral(s): Orders Placed This Encounter  Procedures  . Lumbar Epidural Injection    Considering:   Right intra-articular hip steroid injection Intra-articular knee Hyalgan Genicular nerve block Lumbar spinal cord stimulation   PRN Procedures:   To be determined at a later time   Provider-requested follow-up: Return in about 2 weeks (around 11/19/2018) for Procedure.  Future Appointments  Date Time Provider [place]  11/18/2018 11:00 AM Gillis Santa, MD [facility] None  12/06/2018  9:40 AM [place] ADVISOR [facility]  02/18/2019  2:20 PM [name], MD [facility]  03/19/2019  1:30 PM Virgel Manifold, MD [facility] None    Primary Care Physician: [name], MD Location: [facility] Note by: Gillis Santa, M.D Date: 11/05/2018; Time: 12:27 PM  Patient Instructions   Preparing for Procedure with Sedation Instructions: . Oral Intake: Do not eat or drink anything for at least 8 hours prior to your procedure. . Transportation: Public transportation is not allowed. Bring an adult driver. The driver must be physically present in our waiting room before any procedure can be started. [name] Physical Assistance: Bring an adult capable of physically assisting you, in the event you need help. . Blood Pressure Medicine: Take your blood pressure medicine with a sip of water the morning of the procedure. . Insulin: Take only  of your normal insulin dose. . Preventing infections: Shower with an antibacterial soap the morning of your procedure. . Build-up your immune system: Take 1000 mg of Vitamin C with  every meal (3 times a day) the day prior to your procedure. . Pregnancy: If you are pregnant, call and cancel the procedure. . Sickness: If you have a cold, fever, or any active infections, call and cancel the procedure. . Arrival: You must be in the facility at least 30  minutes prior to your scheduled procedure. . Children: Do not bring children with you. . Dress appropriately: Bring dark clothing that you would not mind if they get stained. . Valuables: Do not bring any jewelry or valuables. Procedure appointments are reserved for interventional treatments only. [name] No Prescription Refills. . No medication changes will be discussed during procedure appointments. No disability issues will be discussed. Epidural Steroid Injection An epidural steroid injection is given to relieve pain in your neck, back, or legs that is caused by the irritation or swelling of a nerve root. This procedure involves injecting a steroid and numbing medicine (anesthetic) into the epidural space. The epidural space is the space between the outer covering of your spinal cord and the bones that form your backbone (vertebra).  LET [facility] CARE PROVIDER KNOW ABOUT:  Any allergies you have. All medicines you are taking, including vitamins, herbs, eye drops, creams, and over-the-counter medicines such as aspirin. Previous problems you or members of your family have had with the use of anesthetics. Any blood disorders or blood clotting disorders you have. Previous surgeries you have had. Medical conditions you have.  RISKS AND COMPLICATIONS Generally, this is a safe procedure. However, as with any procedure, complications can occur. Possible complications of epidural steroid injection include: Headache. Bleeding. Infection. Allergic reaction to the medicines. Damage to your nerves. The response to this procedure depends on the underlying cause of the pain and its duration. People who have long-term (chronic) pain are less likely to benefit from epidural steroids than are those people whose pain comes on strong and suddenly.  BEFORE THE PROCEDURE  Ask your health care provider about changing or stopping your regular medicines. You may be advised to stop taking blood-thinning  medicines a few days before the procedure. You may be given medicines to reduce anxiety. Arrange for someone to take you home after the procedure.  PROCEDURE  You will remain awake during the procedure. You may receive medicine to make you relaxed. You will be asked to lie on your stomach. The injection site will be cleaned. The injection site will be numbed with a medicine (local anesthetic). A needle will be injected through your skin into the epidural space. Your health care provider will use an X-ray machine to ensure that the steroid is delivered closest to the affected nerve. You may have minimal discomfort at this time. Once the needle is in the right position, the local anesthetic and the steroid will be injected into the epidural space. The needle will then be removed and a bandage will be applied to the injection site.  AFTER THE PROCEDURE  You may be monitored for a short time before you go home. You may feel weakness or numbness in your arm or leg, which disappears within hours. You may be allowed to eat, drink, and take your regular medicine. You may have soreness at the site of the injection.   This information is not intended to replace advice given to you by your health care provider. Make sure you discuss any questions you have with your health care provider.   Document Released: 11/21/2007 Document Revised: 04/16/2013 Document Reviewed: 01/31/2013 Elsevier Interactive Patient Education 2016 [employer]  GENERAL RISKS AND COMPLICATIONS  What are the risk, side effects and possible complications? Generally speaking, most procedures are safe.  However, with any procedure there are risks, side effects, and the possibility of complications.  The risks and complications are dependent upon the sites that are lesioned, or the type of nerve block to be performed.  The closer the procedure is to the spine, the more serious the risks are.  Great care is taken when placing the  radio frequency needles, block needles or lesioning probes, but sometimes complications can occur. Infection: Any time there is an injection through the skin, there is a risk of infection.  This is why sterile conditions are used for these blocks. There are four possible types of infection: 1. Localized skin infection. 2. Central Nervous System Infection: This can be in the form of Meningitis, which can be deadly. 3. Epidural Infections: This can be in the form of an epidural abscess, which can cause pressure inside of the spine, causing compression of the spinal cord with subsequent paralysis. This would require an emergency surgery to decompress, and there are no guarantees that the patient would recover from the paralysis. 4. Discitis: This is an infection of the intervertebral discs. It occurs in about 1% of discography procedures. It is difficult to treat and it may lead to surgery. Pain: the needles have to go through skin and soft tissues, will cause soreness. Damage to internal structures:  The nerves to be lesioned may be near blood vessels or other nerves which can be potentially damaged. Bleeding: Bleeding is more common if the patient is taking blood thinners such as  aspirin, Coumadin, Ticiid, Plavix, etc., or if he/she have some genetic predisposition such as hemophilia. Bleeding into the spinal canal can cause compression of the spinal  cord with subsequent paralysis.  This would require an emergency surgery to decompress and there are no guarantees that the patient would recover from the paralysis. Pneumothorax: Puncturing of a lung is a possibility, every time a needle is introduced in the area of the chest or upper back.  Pneumothorax refers to free air around the collapsed lung(s), inside of the thoracic cavity (chest cavity).  Another two possible complications related to a similar event would include: Hemothorax and Chylothorax. These are variations of the Pneumothorax, where instead of  air around the collapsed lung(s), you may have blood or chyle, respectively. Spinal headaches: They may occur with any procedures in the area of the spine. Persistent CSF (Cerebro-Spinal Fluid) leakage: This is a rare problem, but may occur with prolonged intrathecal or epidural catheters either due to the formation of a fistulous track or a dural tear. Nerve damage: By working so close to the spinal cord, there is always a possibility of nerve damage, which could be as serious as a permanent spinal cord injury with paralysis. Death: Although rare, severe deadly allergic reactions known as "Anaphylactic reaction" can occur to any of the medications used. Worsening of the symptoms: We can always make thing worse.  What are the chances of something like this happening? Chances of any of this occuring are extremely low.  By statistics, you have more of a chance of getting killed in a motor vehicle accident: while driving to the hospital than any of the above occurring .  Nevertheless, you should be aware that they are possibilities.  In general, it is similar to taking a shower.  Everybody knows that you can slip, hit your head and get killed.  Does that mean that you should not shower again?  Nevertheless always keep in mind that statistics do not mean anything if you happen to be on the wrong side of them.  Even if a procedure has a 1 (one) in a 1,000,000 (million) chance of going wrong, it you happen to be that one..Also, keep in mind that by statistics, you have more of a chance of having something go wrong when taking medications.  Who should not have this procedure? If you are on a blood thinning medication (e.g. Coumadin, Plavix, see list of "Blood Thinners"), or if you have an active infection going on, you should not have the procedure.  If you are taking any blood thinners, please inform your physician.  Preparing for your procedure: Do not eat or drink anything at least eight (8) hours prior to  the procedure. Bring a driver with you .  It cannot be a taxi. Come accompanied by an adult that can drive you back, and that is strong enough to help you if your legs get weak or numb from the local anesthetic. Take all of your medicines the morning of the procedure with just enough water to swallow them. If you have diabetes, make sure that you are scheduled to have your procedure done first thing in the morning, whenever possible. If you have diabetes, take only half of your insulin dose and notify our nurse that you have done so as soon as you arrive at the clinic. If you are diabetic, but only take blood sugar pills (oral hypoglycemic), then do not take them on the morning of your procedure.  You may take them after you have had the procedure. Do not take aspirin or any aspirin-containing medications, at least eleven (11) days prior to the procedure.  They may prolong bleeding. Wear loose fitting clothing that may be easy to take off and that you would not mind if it got stained with Betadine or blood. Do not wear any jewelry or perfume Remove any nail coloring.  It will interfere with some of our monitoring equipment. If you take Metformin for your diabetes, stop it 48 hours prior to the procedure.  NOTE: Remember that this is not meant to be interpreted as a complete list of all possible complications.  Unforeseen problems may occur.  BLOOD THINNERS The following drugs contain aspirin or other products, which can cause increased bleeding during surgery and should not be taken for 2 weeks prior to and 1 week after surgery.  If you should need take something for relief of minor pain, you may take acetaminophen which is found in Tylenol,m Datril, Anacin-3 and Panadol. It is not blood thinner. The products listed below are.  Do not take any of the products listed below in addition to any listed on your instruction sheet.  A.P.C or A.P.C with Codeine Codeine Phosphate Capsules #3 Ibuprofen  Ridaura  ABC compound Congesprin Imuran rimadil  Advil Cope Indocin Robaxisal  Alka-Seltzer Effervescent Pain Reliever and Antacid Coricidin or Coricidin-D  Indomethacin Rufen  Alka-Seltzer plus Cold Medicine Cosprin Ketoprofen S-A-C Tablets  Anacin Analgesic Tablets or Capsules Coumadin Korlgesic Salflex  Anacin Extra Strength Analgesic tablets or capsules CP-2 Tablets Lanoril Salicylate  Anaprox Cuprimine Capsules Levenox Salocol  Anexsia-D Dalteparin Magan Salsalate  Anodynos Darvon compound Magnesium Salicylate Sine-off  Ansaid Dasin Capsules Magsal Sodium Salicylate  Anturane Depen Capsules Marnal Soma  APF Arthritis pain formula Dewitt's Pills Measurin Stanback  Argesic Dia-Gesic Meclofenamic Sulfinpyrazone  Arthritis Bayer Timed Release  Aspirin Diclofenac Meclomen Sulindac  Arthritis pain formula Anacin Dicumarol Medipren Supac  Analgesic (Safety coated) Arthralgen Diffunasal Mefanamic Suprofen  Arthritis Strength Bufferin Dihydrocodeine Mepro Compound Suprol  Arthropan liquid Dopirydamole Methcarbomol with Aspirin Synalgos  ASA tablets/Enseals Disalcid Micrainin Tagament  Ascriptin Doan's Midol Talwin  Ascriptin A/D Dolene Mobidin Tanderil  Ascriptin Extra Strength Dolobid Moblgesic Ticlid  Ascriptin with Codeine Doloprin or Doloprin with Codeine Momentum Tolectin  Asperbuf Duoprin Mono-gesic Trendar  Aspergum Duradyne Motrin or Motrin IB Triminicin  Aspirin plain, buffered or enteric coated Durasal Myochrisine Trigesic  Aspirin Suppositories Easprin Nalfon Trillsate  Aspirin with Codeine Ecotrin Regular or Extra Strength Naprosyn Uracel  Atromid-S Efficin Naproxen Ursinus  Auranofin Capsules Elmiron Neocylate Vanquish  Axotal Emagrin Norgesic Verin  Azathioprine Empirin or Empirin with Codeine Normiflo Vitamin E  Azolid Emprazil Nuprin Voltaren  Bayer Aspirin plain, buffered or children's or timed BC Tablets or powders Encaprin Orgaran Warfarin Sodium  Buff-a-Comp  Enoxaparin Orudis Zorpin  Buff-a-Comp with Codeine Equegesic Os-Cal-Gesic   Buffaprin Excedrin plain, buffered or Extra Strength Oxalid   Bufferin Arthritis Strength Feldene Oxphenbutazone   Bufferin plain or Extra Strength Feldene Capsules Oxycodone with Aspirin   Bufferin with Codeine Fenoprofen Fenoprofen Pabalate or Pabalate-SF   Buffets II Flogesic Panagesic   Buffinol plain or Extra Strength Florinal or Florinal with Codeine Panwarfarin   Buf-Tabs Flurbiprofen Penicillamine   Butalbital Compound Four-way cold tablets Penicillin   Butazolidin Fragmin Pepto-Bismol   Carbenicillin Geminisyn Percodan   Carna Arthritis Reliever Geopen Persantine   Carprofen Gold's salt Persistin   Chloramphenicol Goody's Phenylbutazone   Chloromycetin Haltrain Piroxlcam   Clmetidine heparin Plaquenil   Cllnoril Hyco-pap Ponstel   Clofibrate Hydroxy chloroquine Propoxyphen         . Before stopping any of these medications, be sure to consult the physician who ordered them.  Some, such as Coumadin (Warfarin) are ordered to prevent or treat serious conditions such as "deep thrombosis", "pumonary embolisms", and other heart problems.  The amount of time that you may need off of the medication may also vary with the medication and the reason for which you were taking it.  If you are taking any of these medications, please make sure you notify your pain physician before you undergo any procedures.

## 2018-11-05 NOTE — Patient Instructions (Addendum)
Preparing for Procedure with Sedation Instructions: . Oral Intake: Do not eat or drink anything for at least 8 hours prior to your procedure. . Transportation: Public transportation is not allowed. Bring an adult driver. The driver must be physically present in our waiting room before any procedure can be started. [name] Physical Assistance: Bring an adult capable of physically assisting you, in the event you need help. . Blood Pressure Medicine: Take your blood pressure medicine with a sip of water the morning of the procedure. . Insulin: Take only  of your normal insulin dose. . Preventing infections: Shower with an antibacterial soap the morning of your procedure. . Build-up your immune system: Take 1000 mg of Vitamin C with every meal (3 times a day) the day prior to your procedure. . Pregnancy: If you are pregnant, call and cancel the procedure. . Sickness: If you have a cold, fever, or any active infections, call and cancel the procedure. . Arrival: You must be in the facility at least 30 minutes prior to your scheduled procedure. . Children: Do not bring children with you. . Dress appropriately: Bring dark clothing that you would not mind if they get stained. . Valuables: Do not bring any jewelry or valuables. Procedure appointments are reserved for interventional treatments only. [name] No Prescription Refills. . No medication changes will be discussed during procedure appointments. No disability issues will be discussed. Epidural Steroid Injection An epidural steroid injection is given to relieve pain in your neck, back, or legs that is caused by the irritation or swelling of a nerve root. This procedure involves injecting a steroid and numbing medicine (anesthetic) into the epidural space. The epidural space is the space between the outer covering of your spinal cord and the bones that form your backbone (vertebra).  LET [facility] CARE PROVIDER KNOW ABOUT:  Any allergies you have. All  medicines you are taking, including vitamins, herbs, eye drops, creams, and over-the-counter medicines such as aspirin. Previous problems you or members of your family have had with the use of anesthetics. Any blood disorders or blood clotting disorders you have. Previous surgeries you have had. Medical conditions you have.  RISKS AND COMPLICATIONS Generally, this is a safe procedure. However, as with any procedure, complications can occur. Possible complications of epidural steroid injection include: Headache. Bleeding. Infection. Allergic reaction to the medicines. Damage to your nerves. The response to this procedure depends on the underlying cause of the pain and its duration. People who have long-term (chronic) pain are less likely to benefit from epidural steroids than are those people whose pain comes on strong and suddenly.  BEFORE THE PROCEDURE  Ask your health care provider about changing or stopping your regular medicines. You may be advised to stop taking blood-thinning medicines a few days before the procedure. You may be given medicines to reduce anxiety. Arrange for someone to take you home after the procedure.  PROCEDURE  You will remain awake during the procedure. You may receive medicine to make you relaxed. You will be asked to lie on your stomach. The injection site will be cleaned. The injection site will be numbed with a medicine (local anesthetic). A needle will be injected through your skin into the epidural space. Your health care provider will use an X-ray machine to ensure that the steroid is delivered closest to the affected nerve. You may have minimal discomfort at this time. Once the needle is in the right position, the local anesthetic and the steroid will be injected into  the epidural space. The needle will then be removed and a bandage will be applied to the injection site.  AFTER THE PROCEDURE  You may be monitored for a short time before you go  home. You may feel weakness or numbness in your arm or leg, which disappears within hours. You may be allowed to eat, drink, and take your regular medicine. You may have soreness at the site of the injection.   This information is not intended to replace advice given to you by your health care provider. Make sure you discuss any questions you have with your health care provider.   Document Released: 11/21/2007 Document Revised: 04/16/2013 Document Reviewed: 01/31/2013 Elsevier Interactive Patient Education 2016 [place]  What are the risk, side effects and possible complications? Generally speaking, most procedures are safe.  However, with any procedure there are risks, side effects, and the possibility of complications.  The risks and complications are dependent upon the sites that are lesioned, or the type of nerve block to be performed.  The closer the procedure is to the spine, the more serious the risks are.  Great care is taken when placing the radio frequency needles, block needles or lesioning probes, but sometimes complications can occur. Infection: Any time there is an injection through the skin, there is a risk of infection.  This is why sterile conditions are used for these blocks. There are four possible types of infection: 1. Localized skin infection. 2. Central Nervous System Infection: This can be in the form of Meningitis, which can be deadly. 3. Epidural Infections: This can be in the form of an epidural abscess, which can cause pressure inside of the spine, causing compression of the spinal cord with subsequent paralysis. This would require an emergency surgery to decompress, and there are no guarantees that the patient would recover from the paralysis. 4. Discitis: This is an infection of the intervertebral discs. It occurs in about 1% of discography procedures. It is difficult to treat and it may lead to surgery. Pain: the needles have to  go through skin and soft tissues, will cause soreness. Damage to internal structures:  The nerves to be lesioned may be near blood vessels or other nerves which can be potentially damaged. Bleeding: Bleeding is more common if the patient is taking blood thinners such as  aspirin, Coumadin, Ticiid, Plavix, etc., or if he/she have some genetic predisposition such as hemophilia. Bleeding into the spinal canal can cause compression of the spinal  cord with subsequent paralysis.  This would require an emergency surgery to decompress and there are no guarantees that the patient would recover from the paralysis. Pneumothorax: Puncturing of a lung is a possibility, every time a needle is introduced in the area of the chest or upper back.  Pneumothorax refers to free air around the collapsed lung(s), inside of the thoracic cavity (chest cavity).  Another two possible complications related to a similar event would include: Hemothorax and Chylothorax. These are variations of the Pneumothorax, where instead of air around the collapsed lung(s), you may have blood or chyle, respectively. Spinal headaches: They may occur with any procedures in the area of the spine. Persistent CSF (Cerebro-Spinal Fluid) leakage: This is a rare problem, but may occur with prolonged intrathecal or epidural catheters either due to the formation of a fistulous track or a dural tear. Nerve damage: By working so close to the spinal cord, there is always a possibility of nerve damage, which could  be as serious as a permanent spinal cord injury with paralysis. Death: Although rare, severe deadly allergic reactions known as "Anaphylactic reaction" can occur to any of the medications used. Worsening of the symptoms: We can always make thing worse.  What are the chances of something like this happening? Chances of any of this occuring are extremely low.  By statistics, you have more of a chance of getting killed in a motor vehicle accident: while  driving to the hospital than any of the above occurring .  Nevertheless, you should be aware that they are possibilities.  In general, it is similar to taking a shower.  Everybody knows that you can slip, hit your head and get killed.  Does that mean that you should not shower again?  Nevertheless always keep in mind that statistics do not mean anything if you happen to be on the wrong side of them.  Even if a procedure has a 1 (one) in a 1,000,000 (million) chance of going wrong, it you happen to be that one..Also, keep in mind that by statistics, you have more of a chance of having something go wrong when taking medications.  Who should not have this procedure? If you are on a blood thinning medication (e.g. Coumadin, Plavix, see list of "Blood Thinners"), or if you have an active infection going on, you should not have the procedure.  If you are taking any blood thinners, please inform your physician.  Preparing for your procedure: Do not eat or drink anything at least eight (8) hours prior to the procedure. Bring a driver with you .  It cannot be a taxi. Come accompanied by an adult that can drive you back, and that is strong enough to help you if your legs get weak or numb from the local anesthetic. Take all of your medicines the morning of the procedure with just enough water to swallow them. If you have diabetes, make sure that you are scheduled to have your procedure done first thing in the morning, whenever possible. If you have diabetes, take only half of your insulin dose and notify our nurse that you have done so as soon as you arrive at the clinic. If you are diabetic, but only take blood sugar pills (oral hypoglycemic), then do not take them on the morning of your procedure.  You may take them after you have had the procedure. Do not take aspirin or any aspirin-containing medications, at least eleven (11) days prior to the procedure.  They may prolong bleeding. Wear loose fitting clothing  that may be easy to take off and that you would not mind if it got stained with Betadine or blood. Do not wear any jewelry or perfume Remove any nail coloring.  It will interfere with some of our monitoring equipment. If you take Metformin for your diabetes, stop it 48 hours prior to the procedure.  NOTE: Remember that this is not meant to be interpreted as a complete list of all possible complications.  Unforeseen problems may occur.  BLOOD THINNERS The following drugs contain aspirin or other products, which can cause increased bleeding during surgery and should not be taken for 2 weeks prior to and 1 week after surgery.  If you should need take something for relief of minor pain, you may take acetaminophen which is found in Tylenol,m Datril, Anacin-3 and Panadol. It is not blood thinner. The products listed below are.  Do not take any of the products listed below in addition to  any listed on your instruction sheet.  A.P.C or A.P.C with Codeine Codeine Phosphate Capsules #3 Ibuprofen Ridaura  ABC compound Congesprin Imuran rimadil  Advil Cope Indocin Robaxisal  Alka-Seltzer Effervescent Pain Reliever and Antacid Coricidin or Coricidin-D  Indomethacin Rufen  Alka-Seltzer plus Cold Medicine Cosprin Ketoprofen S-A-C Tablets  Anacin Analgesic Tablets or Capsules Coumadin Korlgesic Salflex  Anacin Extra Strength Analgesic tablets or capsules CP-2 Tablets Lanoril Salicylate  Anaprox Cuprimine Capsules Levenox Salocol  Anexsia-D Dalteparin Magan Salsalate  Anodynos Darvon compound Magnesium Salicylate Sine-off  Ansaid Dasin Capsules Magsal Sodium Salicylate  Anturane Depen Capsules Marnal Soma  APF Arthritis pain formula Dewitt's Pills Measurin Stanback  Argesic Dia-Gesic Meclofenamic Sulfinpyrazone  Arthritis Bayer Timed Release Aspirin Diclofenac Meclomen Sulindac  Arthritis pain formula Anacin Dicumarol Medipren Supac  Analgesic (Safety coated) Arthralgen Diffunasal Mefanamic Suprofen   Arthritis Strength Bufferin Dihydrocodeine Mepro Compound Suprol  Arthropan liquid Dopirydamole Methcarbomol with Aspirin Synalgos  ASA tablets/Enseals Disalcid Micrainin Tagament  Ascriptin Doan's Midol Talwin  Ascriptin A/D Dolene Mobidin Tanderil  Ascriptin Extra Strength Dolobid Moblgesic Ticlid  Ascriptin with Codeine Doloprin or Doloprin with Codeine Momentum Tolectin  Asperbuf Duoprin Mono-gesic Trendar  Aspergum Duradyne Motrin or Motrin IB Triminicin  Aspirin plain, buffered or enteric coated Durasal Myochrisine Trigesic  Aspirin Suppositories Easprin Nalfon Trillsate  Aspirin with Codeine Ecotrin Regular or Extra Strength Naprosyn Uracel  Atromid-S Efficin Naproxen Ursinus  Auranofin Capsules Elmiron Neocylate Vanquish  Axotal Emagrin Norgesic Verin  Azathioprine Empirin or Empirin with Codeine Normiflo Vitamin E  Azolid Emprazil Nuprin Voltaren  Bayer Aspirin plain, buffered or children's or timed BC Tablets or powders Encaprin Orgaran Warfarin Sodium  Buff-a-Comp Enoxaparin Orudis Zorpin  Buff-a-Comp with Codeine Equegesic Os-Cal-Gesic   Buffaprin Excedrin plain, buffered or Extra Strength Oxalid   Bufferin Arthritis Strength Feldene Oxphenbutazone   Bufferin plain or Extra Strength Feldene Capsules Oxycodone with Aspirin   Bufferin with Codeine Fenoprofen Fenoprofen Pabalate or Pabalate-SF   Buffets II Flogesic Panagesic   Buffinol plain or Extra Strength Florinal or Florinal with Codeine Panwarfarin   Buf-Tabs Flurbiprofen Penicillamine   Butalbital Compound Four-way cold tablets Penicillin   Butazolidin Fragmin Pepto-Bismol   Carbenicillin Geminisyn Percodan   Carna Arthritis Reliever Geopen Persantine   Carprofen Gold's salt Persistin   Chloramphenicol Goody's Phenylbutazone   Chloromycetin Haltrain Piroxlcam   Clmetidine heparin Plaquenil   Cllnoril Hyco-pap Ponstel   Clofibrate Hydroxy chloroquine Propoxyphen         . Before stopping any of these  medications, be sure to consult the physician who ordered them.  Some, such as Coumadin (Warfarin) are ordered to prevent or treat serious conditions such as "deep thrombosis", "pumonary embolisms", and other heart problems.  The amount of time that you may need off of the medication may also vary with the medication and the reason for which you were taking it.  If you are taking any of these medications, please make sure you notify your pain physician before you undergo any procedures.

## 2018-11-05 NOTE — Progress Notes (Signed)
Safety precautions to be maintained throughout the outpatient stay will include: orient to surroundings, keep bed in low position, maintain call bell within reach at all times, provide assistance with transfer out of bed and ambulation.  

## 2018-11-18 ENCOUNTER — Other Ambulatory Visit: Payer: Self-pay

## 2018-11-18 ENCOUNTER — Ambulatory Visit (HOSPITAL_BASED_OUTPATIENT_CLINIC_OR_DEPARTMENT_OTHER): Payer: PPO | Admitting: Student in an Organized Health Care Education/Training Program

## 2018-11-18 ENCOUNTER — Encounter: Payer: Self-pay | Admitting: Student in an Organized Health Care Education/Training Program

## 2018-11-18 ENCOUNTER — Ambulatory Visit
Admission: RE | Admit: 2018-11-18 | Discharge: 2018-11-18 | Disposition: A | Discharge status: Home / Self Care | Payer: PPO | Source: Ambulatory Visit | Attending: Student in an Organized Health Care Education/Training Program | Admitting: Student in an Organized Health Care Education/Training Program

## 2018-11-18 VITALS — BP 149/78 | HR 65 | Temp 98.2°F | Resp 15 | Ht 71.0 in | Wt 162.0 lb

## 2018-11-18 DIAGNOSIS — M5416 Radiculopathy, lumbar region: Secondary | ICD-10-CM

## 2018-11-18 MED ORDER — FENTANYL CITRATE (PF) 100 MCG/2ML IJ SOLN
25.0000 ug | INTRAMUSCULAR | Status: DC | PRN
  Administered 2018-11-18: 75 ug via INTRAVENOUS
  Filled 2018-11-18: qty 2

## 2018-11-18 MED ORDER — LIDOCAINE HCL 2 % IJ SOLN
20.0000 mL | Freq: Once | INTRAMUSCULAR | Status: AC
  Administered 2018-11-18: 400 mg
  Filled 2018-11-18: qty 40

## 2018-11-18 MED ORDER — ROPIVACAINE HCL 2 MG/ML IJ SOLN
1.0000 mL | Freq: Once | INTRAMUSCULAR | Status: AC
  Administered 2018-11-18: 1 mL via EPIDURAL
  Filled 2018-11-18: qty 10

## 2018-11-18 MED ORDER — SODIUM CHLORIDE (PF) 0.9 % IJ SOLN
INTRAMUSCULAR | Status: AC
  Filled 2018-11-18: qty 10

## 2018-11-18 MED ORDER — DEXAMETHASONE SODIUM PHOSPHATE 10 MG/ML IJ SOLN
10.0000 mg | Freq: Once | INTRAMUSCULAR | Status: AC
  Administered 2018-11-18: 10 mg
  Filled 2018-11-18: qty 1

## 2018-11-18 MED ORDER — SODIUM CHLORIDE 0.9% FLUSH
1.0000 mL | Freq: Once | INTRAVENOUS | Status: AC
  Administered 2018-11-18: 1 mL

## 2018-11-18 MED ORDER — IOPAMIDOL (ISOVUE-M 200) INJECTION 41%
10.0000 mL | Freq: Once | INTRAMUSCULAR | Status: AC
  Administered 2018-11-18: 10 mL via EPIDURAL
  Filled 2018-11-18: qty 10

## 2018-11-18 NOTE — Progress Notes (Signed)
Safety precautions to be maintained throughout the outpatient stay will include: orient to surroundings, keep bed in low position, maintain call bell within reach at all times, provide assistance with transfer out of bed and ambulation.  

## 2018-11-18 NOTE — Progress Notes (Signed)
Patient's Name: Mark Nash  MRN: [id number]  Referring Provider: [name], MD  DOB: [birth year]  PCP: [name], MD  DOS: 11/18/2018  Note by: Gillis Santa, MD  Service setting: Ambulatory outpatient  Specialty: Interventional Pain Management  Patient type: Established  Location: [facility] (AMB) Pain Management Facility  Visit type: Interventional Procedure   Primary Reason for Visit: Interventional Pain Management Treatment. CC: Back Pain (lower)  Procedure:          Anesthesia, Analgesia, Anxiolysis:  Type: Diagnostic Inter-Laminar Epidural Steroid Injection  #1  Region: Lumbar Level: L2-3 Level. Laterality: Right-Sided Paramedial  Type: Moderate (Conscious) Sedation combined with Local Anesthesia Indication(s): Analgesia and Anxiety Route: Intravenous (IV) IV Access: Secured Sedation: Meaningful verbal contact was maintained at all times during the procedure  Local Anesthetic: Lidocaine 1-2%  Position: Prone with head of the table was raised to facilitate breathing.   Indications: 1. Lumbar radiculopathy    Pain Score: Pre-procedure: 8 /10 Post-procedure: 0-No pain/10  Pre-op Assessment:  Mark Nash is a 63 y.o. (year old), male patient, seen today for interventional treatment. He  has a past surgical history that includes Sinusotomy; Hernia repair; L3 TO L5 LAMINECTOMY FOR DECOMPRESSION (07/17/2016); Repair of cerebrospinal fluid leak (N/A, 09/08/2016); Colonoscopy with propofol (N/A, 09/07/2017); Back surgery; Flexible sigmoidoscopy (N/A, 11/09/2017); Esophagogastroduodenoscopy (egd) with propofol (N/A, 12/07/2017); and Inguinal hernia repair (Bilateral, 06/21/2018). [name] has a current medication list which includes the following prescription(s): diclofenac, gabapentin, hydrochlorothiazide, insulin detemir, lisinopril, methocarbamol, pravastatin, and vitamin d (ergocalciferol), and the following Facility-Administered Medications: fentanyl. His primarily concern today is  the Back Pain (lower)  Initial Vital Signs:  Pulse/HCG Rate: 65ECG Heart Rate: 62 Temp: 98.3 F (36.8 C) Resp: 18 BP: 131/77 SpO2: 98 %  BMI: Estimated body mass index is 22.59 kg/m as calculated from the following:   Height as of this encounter: 5\' 11"  (1.803 m).   Weight as of this encounter: 162 lb (73.5 kg).  Risk Assessment: Allergies: Reviewed. He is allergic to penicillins; aspirin; ibuprofen; lyrica [pregabalin]; and acetaminophen.  Allergy Precautions: None required Coagulopathies: Reviewed. None identified.  Blood-thinner therapy: None at this time Active Infection(s): Reviewed. None identified. Mark Nash is afebrile  Site Confirmation: Mark Nash was asked to confirm the procedure and laterality before marking the site Procedure checklist: Completed Consent: Before the procedure and under the influence of no sedative(s), amnesic(s), or anxiolytics, the patient was informed of the treatment options, risks and possible complications. To fulfill our ethical and legal obligations, as recommended by the American Medical Association's Code of Ethics, I have informed the patient of my clinical impression; the nature and purpose of the treatment or procedure; the risks, benefits, and possible complications of the intervention; the alternatives, including doing nothing; the risk(s) and benefit(s) of the alternative treatment(s) or procedure(s); and the risk(s) and benefit(s) of doing nothing. The patient was provided information about the general risks and possible complications associated with the procedure. These may include, but are not limited to: failure to achieve desired goals, infection, bleeding, organ or nerve damage, allergic reactions, paralysis, and death. In addition, the patient was informed of those risks and complications associated to Spine-related procedures, such as failure to decrease pain; infection (i.e.: Meningitis, epidural or intraspinal abscess); bleeding (i.e.:  epidural hematoma, subarachnoid hemorrhage, or any other type of intraspinal or peri-dural bleeding); organ or nerve damage (i.e.: Any type of peripheral nerve, nerve root, or spinal cord injury) with subsequent damage to sensory, motor, and/or autonomic systems, resulting  in permanent pain, numbness, and/or weakness of one or several areas of the body; allergic reactions; (i.e.: anaphylactic reaction); and/or death. Furthermore, the patient was informed of those risks and complications associated with the medications. These include, but are not limited to: allergic reactions (i.e.: anaphylactic or anaphylactoid reaction(s)); adrenal axis suppression; blood sugar elevation that in diabetics may result in ketoacidosis or comma; water retention that in patients with history of congestive heart failure may result in shortness of breath, pulmonary edema, and decompensation with resultant heart failure; weight gain; swelling or edema; medication-induced neural toxicity; particulate matter embolism and blood vessel occlusion with resultant organ, and/or nervous system infarction; and/or aseptic necrosis of one or more joints. Finally, the patient was informed that Medicine is not an exact science; therefore, there is also the possibility of unforeseen or unpredictable risks and/or possible complications that may result in a catastrophic outcome. The patient indicated having understood very clearly. We have given the patient no guarantees and we have made no promises. Enough time was given to the patient to ask questions, all of which were answered to the patient's satisfaction. Mark Nash has indicated that he wanted to continue with the procedure. Attestation: I, the ordering provider, attest that I have discussed with the patient the benefits, risks, side-effects, alternatives, likelihood of achieving goals, and potential problems during recovery for the procedure that I have provided informed consent. Date  Time:  11/18/2018 10:32 AM  Pre-Procedure Preparation:  Monitoring: As per clinic protocol. Respiration, ETCO2, SpO2, BP, heart rate and rhythm monitor placed and checked for adequate function Safety Precautions: Patient was assessed for positional comfort and pressure points before starting the procedure. Time-out: I initiated and conducted the "Time-out" before starting the procedure, as per protocol. The patient was asked to participate by confirming the accuracy of the "Time Out" information. Verification of the correct person, site, and procedure were performed and confirmed by me, the nursing staff, and the patient. "Time-out" conducted as per Joint Commission's Universal Protocol (UP.01.01.01). Time: 1124  Description of Procedure:          Target Area: The interlaminar space, initially targeting the lower laminar border of the superior vertebral body. Approach: Paramedial approach. Area Prepped: Entire Posterior Lumbar Region Prepping solution: ChloraPrep (2% chlorhexidine gluconate and 70% isopropyl alcohol) Safety Precautions: Aspiration looking for blood return was conducted prior to all injections. At no point did we inject any substances, as a needle was being advanced. No attempts were made at seeking any paresthesias. Safe injection practices and needle disposal techniques used. Medications properly checked for expiration dates. SDV (single dose vial) medications used. Description of the Procedure: Protocol guidelines were followed. The procedure needle was introduced through the skin, ipsilateral to the reported pain, and advanced to the target area. Bone was contacted and the needle walked caudad, until the lamina was cleared. The epidural space was identified using "loss-of-resistance technique" with 2-3 ml of PF-NaCl (0.9% NSS), in a 5cc LOR glass syringe.  Vitals:   11/18/18 1136 11/18/18 1141 11/18/18 1150 11/18/18 1200  BP: (!) 163/88 (!) 169/94 (!) 156/83 (!) 149/78  Pulse:       Resp: 19 17 14 15   Temp:  98.3 F (36.8 C)  98.2 F (36.8 C)  TempSrc:  Temporal    SpO2: 100% 100% 98% 97%  Weight:      Height:        Start Time: 1124 hrs. End Time: 1136 hrs.  Materials:  Needle(s) Type: Epidural needle Gauge: 17G  Length: 3.5-in Medication(s): Please see orders for medications and dosing details. 8 cc solution made of 5 cc of preservative-free saline, 2 cc of 0.2% ropivacaine, 1 cc of Decadron 10 mg/cc. Imaging Guidance (Spinal):          Type of Imaging Technique: Fluoroscopy Guidance (Spinal) Indication(s): Assistance in needle guidance and placement for procedures requiring needle placement in or near specific anatomical locations not easily accessible without such assistance. Exposure Time: Please see nurses notes. Contrast: Before injecting any contrast, we confirmed that the patient did not have an allergy to iodine, shellfish, or radiological contrast. Once satisfactory needle placement was completed at the desired level, radiological contrast was injected. Contrast injected under live fluoroscopy. No contrast complications. See chart for type and volume of contrast used. Fluoroscopic Guidance: I was personally present during the use of fluoroscopy. "Tunnel Vision Technique" used to obtain the best possible view of the target area. Parallax error corrected before commencing the procedure. "Direction-depth-direction" technique used to introduce the needle under continuous pulsed fluoroscopy. Once target was reached, antero-posterior, oblique, and lateral fluoroscopic projection used confirm needle placement in all planes. Images permanently stored in EMR. Interpretation: I personally interpreted the imaging intraoperatively. Adequate needle placement confirmed in multiple planes. Appropriate spread of contrast into desired area was observed. No evidence of afferent or efferent intravascular uptake. No intrathecal or subarachnoid spread observed. Permanent images  saved into the patient's record.  Antibiotic Prophylaxis:   Anti-infectives (From admission, onward)   None     Indication(s): None identified  Post-operative Assessment:  Post-procedure Vital Signs:  Pulse/HCG Rate: 65(!) 59 Temp: 98.2 F (36.8 C) Resp: 15 BP: (!) 149/78 SpO2: 97 %  EBL: None  Complications: No immediate post-treatment complications observed by team, or reported by patient.  Note: The patient tolerated the entire procedure well. A repeat set of vitals were taken after the procedure and the patient was kept under observation following institutional policy, for this type of procedure. Post-procedural neurological assessment was performed, showing return to baseline, prior to discharge. The patient was provided with post-procedure discharge instructions, including a section on how to identify potential problems. Should any problems arise concerning this procedure, the patient was given instructions to immediately contact us, at any time, without hesitation. In any case, we plan to contact the patient by telephone for a follow-up status report regarding this interventional procedure.  Comments:  No additional relevant information.   5 out of 5 strength bilateral lower extremity: Plantar flexion, dorsiflexion, knee flexion, knee extension.   Plan of Care  Orders:  Orders Placed This Encounter  Procedures  . DG C-Arm 1-60 Min-No Report    Intraoperative interpretation by procedural physician at [place].    Standing Status:   Standing    Number of Occurrences:   1    Order Specific Question:   Reason for exam:    Answer:   Assistance in needle guidance and placement for procedures requiring needle placement in or near specific anatomical locations not easily accessible without such assistance.   Medications ordered for procedure: Meds ordered this encounter  Medications  . iopamidol (ISOVUE-M) 41 % intrathecal injection 10 mL    Must be  Myelogram-compatible. If not available, you may substitute with a water-soluble, non-ionic, hypoallergenic, myelogram-compatible radiological contrast medium.  [name] lidocaine (XYLOCAINE) 2 % (with pres) injection 400 mg  . fentaNYL (SUBLIMAZE) injection 25-50 mcg    Make sure Narcan is available in the pyxis when using this medication. In the  event of respiratory depression (RR< 8/min): Titrate NARCAN (naloxone) in increments of 0.1 to 0.2 mg IV at 2-3 minute intervals, until desired degree of reversal.  . sodium chloride flush (NS) 0.9 % injection 1 mL  . ropivacaine (PF) 2 mg/mL (0.2%) (NAROPIN) injection 1 mL  . dexamethasone (DECADRON) injection 10 mg   Medications administered: We administered iopamidol, lidocaine, fentaNYL, sodium chloride flush, ropivacaine (PF) 2 mg/mL (0.2%), and dexamethasone.  See the medical record for exact dosing, route, and time of administration.  Disposition: Discharge home  Discharge Date & Time: 11/18/2018; 1200 hrs.   Follow-up plan:   Return in about 4 weeks (around 12/16/2018) for Post Procedure Evaluation.     Future Appointments  Date Time Provider [place]  12/06/2018  9:40 AM [place] ADVISOR [place] [facility]  12/17/2018 12:00 PM Gillis Santa, MD [facility] None  02/18/2019  2:20 PM [name], MD [place] [facility]  03/19/2019  1:30 PM Virgel Manifold, MD [facility] None   Primary Care Physician: [name], MD Location: [facility] Note by: Gillis Santa, MD Date: 11/18/2018; Time: 12:52 PM  Disclaimer:  Medicine is not an exact science. The only guarantee in medicine is that nothing is guaranteed. It is important to note that the decision to proceed with this intervention was based on the information collected from the patient. The Data and conclusions were drawn from the patient's questionnaire, the interview, and the physical examination. Because the information was provided in large part by the  patient, it cannot be guaranteed that it has not been purposely or unconsciously manipulated. Every effort has been made to obtain as much relevant data as possible for this evaluation. It is important to note that the conclusions that lead to this procedure are derived in large part from the available data. Always take into account that the treatment will also be dependent on availability of resources and existing treatment guidelines, considered by other Pain Management Practitioners as being common knowledge and practice, at the time of the intervention. For Medico-Legal purposes, it is also important to point out that variation in procedural techniques and pharmacological choices are the acceptable norm. The indications, contraindications, technique, and results of the above procedure should only be interpreted and judged by a Board-Certified Interventional Pain Specialist with extensive familiarity and expertise in the same exact procedure and technique.

## 2018-11-18 NOTE — Patient Instructions (Signed)

## 2018-11-25 ENCOUNTER — Telehealth: Payer: Self-pay | Admitting: Student in an Organized Health Care Education/Training Program

## 2018-11-25 DIAGNOSIS — G629 Polyneuropathy, unspecified: Secondary | ICD-10-CM

## 2018-11-25 MED ORDER — GABAPENTIN 300 MG PO CAPS: 300.0000 mg | ORAL_CAPSULE | Freq: Three times a day (TID) | ORAL | 3 refills | Status: DC

## 2018-11-25 MED ORDER — GABAPENTIN 300 MG PO CAPS: 600.0000 mg | ORAL_CAPSULE | Freq: Two times a day (BID) | ORAL | 3 refills | Status: DC

## 2018-11-25 NOTE — Telephone Encounter (Signed)
Gabapentin sent in: 600 mg twice a day  Requested Prescriptions   Signed Prescriptions Disp Refills  . gabapentin (NEURONTIN) 300 MG capsule 120 capsule 3    Sig: Take 2 capsules (600 mg total) by mouth 2 (two) times daily.    Authorizing Provider: Gillis Santa

## 2018-11-25 NOTE — Telephone Encounter (Signed)
Patient called stating he is almost out of gabapentin, has Dr. Holley Raring taken over this medication for patient? Please check and advise if he needs Virtual Appt.

## 2018-11-25 NOTE — Telephone Encounter (Signed)
Patient called and made aware-[facility]

## 2018-11-26 ENCOUNTER — Other Ambulatory Visit: Payer: Self-pay | Admitting: Family Medicine

## 2018-11-26 ENCOUNTER — Other Ambulatory Visit: Payer: Self-pay | Admitting: Student in an Organized Health Care Education/Training Program

## 2018-11-26 ENCOUNTER — Telehealth: Payer: Self-pay | Admitting: Student in an Organized Health Care Education/Training Program

## 2018-11-26 DIAGNOSIS — I10 Essential (primary) hypertension: Secondary | ICD-10-CM

## 2018-11-26 DIAGNOSIS — M5440 Lumbago with sciatica, unspecified side: Secondary | ICD-10-CM

## 2018-11-26 DIAGNOSIS — G8929 Other chronic pain: Secondary | ICD-10-CM

## 2018-11-26 DIAGNOSIS — M5441 Lumbago with sciatica, right side: Secondary | ICD-10-CM

## 2018-11-26 NOTE — Telephone Encounter (Signed)
I spoke with [place], insurance will not allow fill of Gabapentin until 12-04-18. I called patient and explained this to him. He explained that he ran out because he began taking it with the directions from Dr. Holley Raring while he still had the previous script from another physician. I explained to him that the insurance will not allow him to fill. He became angry and instructed me to cancel any future appointment, stating, "I wont be back there." Dr. Holley Raring informed of this conversation.

## 2018-11-26 NOTE — Telephone Encounter (Signed)
Refill request for Hypertension medication:  Lisinopril 40 mg  Last office visit pertaining to hypertension: 10/10/2018  BP Readings from Last 3 Encounters:  11/18/18 (!) 149/78  11/05/18 (!) 151/79  10/10/18 140/80   Lab Results  Component Value Date   CREATININE 0.81 06/19/2018   BUN 22 06/19/2018   NA 139 06/19/2018   K 4.1 06/19/2018   CL 101 06/19/2018   CO2 30 06/19/2018   Follow-ups on file. 02/18/2019

## 2018-11-26 NOTE — Telephone Encounter (Signed)
Patient called stating he called pharmacy to get meds filled and they told him he cannot fill gabapentin until April 8. Patient is stating he will be out on Thurs April 2. Please verify and let patient know status.

## 2018-11-26 NOTE — Telephone Encounter (Signed)
Is there not something that can be done as the patient was tapering up per Dr. Holley Raring instructions and that is why he is out of meds. Can we call Insurance and speak with them?

## 2018-11-28 NOTE — Telephone Encounter (Signed)
I spoke to patient regarding Gabapentin prescription. He was upset because pharmacy would not fill and he was running out of medication. [facility] on [address]. They will override prescription and have it available for him today. Patient was notified of this. Stated he was doing good on Gabapentin 300mg  2 bid. Would like to cancel any upcoming appointments.

## 2018-12-05 ENCOUNTER — Telehealth: Payer: Self-pay

## 2018-12-05 NOTE — Telephone Encounter (Signed)
Left message for patient regarding AWV scheduled for 12/06/18. [facility] out of office and need to know if available to complete via video chat or reschedule for later in the year. Requested patient to contact the office at [phone] or contact me directly at [phone] to schedule virtual visit.   Virtual Medicare Annual wellness visits scheduled tentatively only through May 2020 during COVID-19 pandemic.     Thank you!

## 2018-12-06 ENCOUNTER — Telehealth: Payer: Self-pay

## 2018-12-06 NOTE — Telephone Encounter (Signed)
He made a formal complaint because of the issue of Gabapentin not being fill and no compassion on their medication error from his staff. Dr. Gita Kudo double what Dr. Ancil Boozer gave him because he felt out of it and couldn't sleep. Then he back it back down then never send in to his pharmacy. He called the office to try to get his Gabepentin filled and had to go without it for 5 days they made it seem like Dr. Ancil Boozer managed this. Even though Dr. Holley Raring was the prescribed it. He would still like to see another pain management doctor because he just doesn't feel comfortable with Dr. Holley Raring. He has trouble with rides and would rather stay in [place] but just not Dr. Holley Raring.  He wanted your recommendations.

## 2018-12-06 NOTE — Telephone Encounter (Signed)
Copied from [place] [id number]. Topic: Referral - Question >> Dec 05, 2018 10:06 AM Vernona Rieger wrote: Reason for CRM: patient said he does not feel comfortable continuing to see Dr Holley Raring at the pain clinic and wants to be referred else where.

## 2018-12-09 ENCOUNTER — Other Ambulatory Visit: Payer: Self-pay | Admitting: Family Medicine

## 2018-12-09 DIAGNOSIS — M5440 Lumbago with sciatica, unspecified side: Principal | ICD-10-CM

## 2018-12-09 DIAGNOSIS — G8929 Other chronic pain: Secondary | ICD-10-CM

## 2018-12-09 NOTE — Telephone Encounter (Signed)
Patient states either place would be fine to him and would let Dr. Ancil Boozer make that choice. He just prefers to stay in [place] due to transportation issues.

## 2018-12-10 DIAGNOSIS — B351 Tinea unguium: Secondary | ICD-10-CM | POA: Diagnosis not present

## 2018-12-10 DIAGNOSIS — E1142 Type 2 diabetes mellitus with diabetic polyneuropathy: Secondary | ICD-10-CM | POA: Diagnosis not present

## 2018-12-10 DIAGNOSIS — E1159 Type 2 diabetes mellitus with other circulatory complications: Secondary | ICD-10-CM | POA: Diagnosis not present

## 2018-12-10 DIAGNOSIS — L851 Acquired keratosis [keratoderma] palmaris et plantaris: Secondary | ICD-10-CM | POA: Diagnosis not present

## 2018-12-17 ENCOUNTER — Ambulatory Visit: Payer: PPO | Admitting: Student in an Organized Health Care Education/Training Program

## 2018-12-20 ENCOUNTER — Encounter: Payer: Self-pay | Admitting: Family Medicine

## 2018-12-20 ENCOUNTER — Ambulatory Visit (INDEPENDENT_AMBULATORY_CARE_PROVIDER_SITE_OTHER): Payer: PPO | Admitting: Family Medicine

## 2018-12-20 ENCOUNTER — Other Ambulatory Visit: Payer: Self-pay

## 2018-12-20 VITALS — BP 140/60 | HR 70 | Temp 98.0°F | Resp 16 | Ht 71.0 in | Wt 156.1 lb

## 2018-12-20 DIAGNOSIS — E1151 Type 2 diabetes mellitus with diabetic peripheral angiopathy without gangrene: Secondary | ICD-10-CM | POA: Diagnosis not present

## 2018-12-20 DIAGNOSIS — Z794 Long term (current) use of insulin: Secondary | ICD-10-CM

## 2018-12-20 DIAGNOSIS — E1142 Type 2 diabetes mellitus with diabetic polyneuropathy: Secondary | ICD-10-CM | POA: Diagnosis not present

## 2018-12-20 DIAGNOSIS — L84 Corns and callosities: Secondary | ICD-10-CM

## 2018-12-20 DIAGNOSIS — E11628 Type 2 diabetes mellitus with other skin complications: Secondary | ICD-10-CM | POA: Diagnosis not present

## 2018-12-20 DIAGNOSIS — I10 Essential (primary) hypertension: Secondary | ICD-10-CM

## 2018-12-20 MED ORDER — LISINOPRIL-HYDROCHLOROTHIAZIDE 20-12.5 MG PO TABS: 2.0000 | ORAL_TABLET | Freq: Every day | ORAL | 0 refills | Status: DC

## 2018-12-20 NOTE — Progress Notes (Signed)
Name: Mark Nash   MRN: [id number]    DOB: [birth year]   Date:12/20/2018       Progress Note  Subjective  Chief Complaint  Chief Complaint  Patient presents with  . Foot Pain    He has been evaluated by podiatry. He needs diabetic shoes and inserts. Podiatry needs more information to help him get these.    HPI  Diabetes with callus formation and PVD: he has been evaluated by Dr. Elvina Mattes, he needs inserts and a new diabetic shoe. He has brittle nails, numbness on both feet intermittently and also callus formation. He is very compliant with diabetes medication and last A1C was 7.1% two months ago. No polyphagia but has polydipsia and polyuria - stable.    Chronic pain: he states he did not feel comfortable with Dr. Holley Raring - he states it was because problems with refill of gabapentin - he was instructed to go up on dose of rx that I had sent and insurance did not cover the refill so he ran without medication for 4 days. He  is waiting for referral to another pain clinic.   HTN: he would like to combine medications today, to decrease cost. No chest pain or palpitation. I will send new rx to pharmacy   Patient Active Problem List   Diagnosis Date Noted  . Lumbar radiculopathy 11/05/2018  . Lumbar post-laminectomy syndrome 11/05/2018  . Diabetic polyneuropathy associated with type 2 diabetes mellitus ([place]) 11/05/2018  . CLE (columnar lined esophagus)   . Gastric irritation   . Duodenum ulcer   . Personal history of colonic polyps   . Polyp of sigmoid colon   . Diverticulosis of large intestine without diverticulitis   . Benign neoplasm of ascending colon   . Osteoarthritis of both hands 09/04/2017  . Chronic pain of left knee 09/04/2017  . Abnormal MRI, knee 09/04/2017  . 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 Lymphedema 07/07/2016  . Chronic hepatitis C without hepatic coma ([place]) 06/01/2016  . Lumbar herniated disc 11/24/2015  . Thrombocytopenia ([place]) 11/11/2015  .  Hepatitis C antibody test positive 07/13/2015  . Chronic constipation 07/13/2015  . Abnormal immunological findings in specimens from other organs, systems and tissues 07/13/2015  . CN (constipation) 07/13/2015  . Benign hypertension 03/08/2015  . COPD, mild ([place]) 03/08/2015  . Dyslipidemia 03/08/2015  . Paranoid schizophrenia ([place]) 03/08/2015  . Acquired polyneuropathy 03/08/2015  . Restless leg 03/08/2015  . Tobacco abuse 03/08/2015  . Callus of foot 03/08/2015  . Claudication ([place]) 03/08/2015  . Chronic obstructive pulmonary disease ([place]) 03/08/2015  . Corn or callus 03/08/2015  . Essential (primary) hypertension 03/08/2015  . HLD (hyperlipidemia) 03/08/2015  . Peripheral vascular disease ([place]) 03/08/2015  . Polyneuropathy 03/08/2015  . Current tobacco use 03/08/2015  . Vitamin D deficiency 04/26/2009  . Avitaminosis D 04/26/2009    Past Surgical History:  Procedure Laterality Date  . BACK SURGERY    . COLONOSCOPY WITH PROPOFOL N/A 09/07/2017   Procedure: COLONOSCOPY WITH PROPOFOL;  Surgeon: Virgel Manifold, MD;  Location: [facility];  Service: Endoscopy;  Laterality: N/A;  . ESOPHAGOGASTRODUODENOSCOPY (EGD) WITH PROPOFOL N/A 12/07/2017   Procedure: ESOPHAGOGASTRODUODENOSCOPY (EGD) WITH PROPOFOL;  Surgeon: Virgel Manifold, MD;  Location: [facility];  Service: Endoscopy;  Laterality: N/A;  . FLEXIBLE SIGMOIDOSCOPY N/A 11/09/2017   Procedure: FLEXIBLE SIGMOIDOSCOPY;  Surgeon: Virgel Manifold, MD;  Location: [facility];  Service: Endoscopy;  Laterality: N/A;  . HERNIA REPAIR     inguinal  . INGUINAL  HERNIA REPAIR Bilateral 06/21/2018   Procedure: LAPAROSCOPIC BILATERAL INGUINAL HERNIA REPAIR;  Surgeon: Vickie Epley, MD;  Location: [facility];  Service: General;  Laterality: Bilateral;  . L3 TO L5 LAMINECTOMY FOR DECOMPRESSION  07/17/2016   [facility]  . REPAIR OF CEREBROSPINAL FLUID LEAK N/A 09/08/2016   Procedure: Lumbar wound  exploration, repair of pseudomenigocele;  Surgeon: Kevan Ny Ditty, MD;  Location: [place];  Service: Neurosurgery;  Laterality: N/A;  Lumbar wound exploration, repair of pseudomenigocele  . SINUSOTOMY      Family History  Problem Relation Age of Onset  . Hypertension Mother   . Diverticulitis Mother   . Colitis Mother   . Diabetes Father   . Hypertension Father   . Heart disease Father   . Diverticulitis Brother   . Diabetes Sister     Social History   Socioeconomic History  . Marital status: Divorced    Spouse name: Not on file  . Number of children: 2  . Years of education: Not on file  . Highest education level: 12th grade  Occupational History  . Occupation: Disabled  Social Needs  . Financial resource strain: Not hard at all  . Food insecurity:    Worry: Never true    Inability: Never true  . Transportation needs:    Medical: No    Non-medical: No  Tobacco Use  . Smoking status: Current Every Day Smoker    Packs/day: 1.50    Years: 46.00    Pack years: 69.00    Types: Cigarettes  . Smokeless tobacco: Never Used  Substance and Sexual Activity  . Alcohol use: No    Alcohol/week: 0.0 standard drinks  . Drug use: No  . Sexual activity: Not Currently  Lifestyle  . Physical activity:    Days per week: 1 day    Minutes per session: 20 min  . Stress: Not at all  Relationships  . Social connections:    Talks on phone: Three times a week    Gets together: Three times a week    Attends religious service: Never    Active member of club or organization: No    Attends meetings of clubs or organizations: Never    Relationship status: Divorced  . Intimate partner violence:    Fear of current or ex partner: No    Emotionally abused: No    Physically abused: No    Forced sexual activity: No  Other Topics Concern  . Not on file  Social History Narrative   Lives alone     Current Outpatient Medications:  .  gabapentin (NEURONTIN) 300 MG capsule, Take 2  capsules (600 mg total) by mouth 2 (two) times daily., Disp: 120 capsule, Rfl: 3 .  insulin detemir (LEVEMIR) 100 UNIT/ML injection, Inject 0.04 mLs (4 Units total) into the skin daily at 3 pm., Disp: 15 mL, Rfl: 0 .  methocarbamol (ROBAXIN) 750 MG tablet, TAKE 1 TABLET BY MOUTH EVERY 8 HOURS AS NEEDED FOR MUSCLE SPASM, Disp: 90 tablet, Rfl: 0 .  pravastatin (PRAVACHOL) 20 MG tablet, Take 1 tablet (20 mg total) by mouth daily. (Patient taking differently: Take 20 mg by mouth daily with supper. ), Disp: 90 tablet, Rfl: 1 .  Vitamin D, Ergocalciferol, (DRISDOL) 50000 units CAPS capsule, Take 1 capsule (50,000 Units total) by mouth every 7 (seven) days. (Patient taking differently: Take 50,000 Units by mouth every 14 (fourteen) days. ), Disp: 12 capsule, Rfl: 1 .  lisinopril-hydrochlorothiazide (ZESTORETIC) 20-12.5  MG tablet, Take 2 tablets by mouth daily., Disp: 180 tablet, Rfl: 0  Allergies  Allergen Reactions  . Penicillins Hives and Swelling    As a child. Has patient had a PCN reaction causing immediate rash, facial/tongue/throat swelling, SOB or lightheadedness with hypotension: YES  Has patient had a PCN reaction causing severe rash involving mucus membranes or skin necrosis: UNKNOWN Has patient had a PCN reaction that required hospitalization: UNKNOWN Has patient had a PCN reaction occurring within the last 10 years: NO  . Aspirin Nausea Only  . Ibuprofen Nausea Only  . Lyrica [Pregabalin] Nausea Only  . Acetaminophen Nausea Only    I personally reviewed active problem list, medication list, allergies, family history, social history with the patient/caregiver today.   ROS  Ten systems reviewed and is negative except as mentioned in HPI   Objective  Vitals:   12/20/18 0951  BP: 140/60  Pulse: 70  Resp: 16  Temp: 98 F (36.7 C)  TempSrc: Oral  SpO2: 98%  Weight: 156 lb 1.6 oz (70.8 kg)  Height: 5\' 11"  (1.803 m)    Body mass index is 21.77 kg/m.  Physical  Exam  Constitutional: Patient appears well-developed and well-nourished.No distress.  HEENT: head atraumatic, normocephalic, pupils equal and reactive to light, neck supple, throat within normal limits Cardiovascular: Normal rate, regular rhythm and normal heart sounds.  No murmur heard. No BLE edema. Pulmonary/Chest: Effort normal and breath sounds normal. No respiratory distress. Abdominal: Soft.  There is no tenderness. Psychiatric: Patient has a normal mood and affect. behavior is normal. Judgment and thought content normal.   Recent Results (from the past 2160 hour(s))  Compliance Drug Analysis, Ur     Status: None   Collection Time: 10/03/18  3:49 PM  Result Value Ref Range   Summary FINAL     Comment: ==================================================================== [facility] COMP DRUG ANALYSIS,UR ==================================================================== Test                             Result       Flag       Units Drug Present and Declared for Prescription Verification   Gabapentin                     PRESENT      EXPECTED   Methocarbamol                  PRESENT      EXPECTED Drug Present not Declared for Prescription Verification   Oxazepam                       53           UNEXPECTED ng/mg creat    Oxazepam may be administered as a scheduled prescription    medication; it is also an expected metabolite of other    benzodiazepine drugs, including diazepam, chlordiazepoxide,    prazepam, clorazepate, halazepam, and temazepam. Drug Absent but Declared for Prescription Verification   Diclofenac                     Not Detected UNEXPECTED    Topical diclofenac, as indicated in the declared medication list,    is not always detected even when used as di rected. ==================================================================== Test                      Result  Flag   Units      Ref Range   Creatinine              86               mg/dL       >=20 ==================================================================== Declared Medications:  The flagging and interpretation on this report are based on the  following declared medications.  Unexpected results may arise from  inaccuracies in the declared medications.  **Note: The testing scope of this panel includes these medications:  Gabapentin (Neurontin)  Methocarbamol (Robaxin)  **Note: The testing scope of this panel does not include small to  moderate amounts of these reported medications:  Topical Diclofenac (Voltaren)  **Note: The testing scope of this panel does not include following  reported medications:  Hydrochlorothiazide (Hydrodiuril)  Insulin (Levemir)  Lisinopril  Pravastatin (Pravachol)  Vitamin D2 (Drisdol) ========================================== ========================== For clinical consultation, please call [phone]. ====================================================================   POCT HgB A1C     Status: Abnormal   Collection Time: 10/10/18 10:44 AM  Result Value Ref Range   Hemoglobin A1C 7.1 (A) 4.0 - 5.6 %   HbA1c POC (<> result, manual entry)     HbA1c, POC (prediabetic range)     HbA1c, POC (controlled diabetic range)      Diabetic Foot Exam: Diabetic Foot Exam - Simple   Simple Foot Form Diabetic Foot exam was performed with the following findings:  Yes 12/20/2018 10:07 AM  Visual Inspection See comments:  Yes Sensation Testing See comments:  Yes Pulse Check Posterior Tibialis and Dorsalis pulse intact bilaterally:  Yes Comments Both feet with brittle nails, callus formation on bilateral medial MCP areas.  Both feet is erythematous and lack of hair consistent with PVD     PHQ2/9: Depression screen [facility] 2/9 12/20/2018 11/18/2018 11/05/2018 10/03/2018 06/10/2018  Decreased Interest 0 0 0 0 0  Down, Depressed, Hopeless 0 0 0 0 0  PHQ - 2 Score 0 0 0 0 0  Altered sleeping 0 - - - 0  Tired, decreased energy 0 - - - 0   Change in appetite 0 - - - 0  Feeling bad or failure about yourself  0 - - - 0  Trouble concentrating 0 - - - 0  Moving slowly or fidgety/restless 0 - - - 0  Suicidal thoughts 0 - - - 0  PHQ-9 Score 0 - - - 0  Difficult doing work/chores - - - - -  Some recent data might be hidden    phq 9 is negative   Fall Risk: Fall Risk  12/20/2018 11/18/2018 11/05/2018 10/03/2018 09/05/2018  Falls in the past year? 0 0 0 0 0  Number falls in past yr: 0 - - - -  Injury with Fall? 0 - - - -  Risk for fall due to : - - - - -     Functional Status Survey: Is the patient deaf or have difficulty hearing?: No Does the patient have difficulty seeing, even when wearing glasses/contacts?: No Does the patient have difficulty concentrating, remembering, or making decisions?: No Does the patient have difficulty walking or climbing stairs?: Yes Does the patient have difficulty dressing or bathing?: No Does the patient have difficulty doing errands alone such as visiting a doctor's office or shopping?: No    Assessment & Plan  1. Type 2 diabetes mellitus with diabetic polyneuropathy, with long-term current use of insulin (HCC)  I will send not to Dr. Elvina Mattes to have  forms done for his diabetic shoes and inserts, it will be beneficial to the patient   2. Type 2 diabetes mellitus with pressure callus (HCC)   3. Type 2 diabetes mellitus with peripheral vascular disease ([place])   4. Benign hypertension  - lisinopril-hydrochlorothiazide (ZESTORETIC) 20-12.5 MG tablet; Take 2 tablets by mouth daily.  Dispense: 180 tablet; Refill: 0  Stop lisinopril 40 and hctz 12.5 mg

## 2018-12-24 ENCOUNTER — Other Ambulatory Visit: Payer: Self-pay | Admitting: Family Medicine

## 2018-12-24 DIAGNOSIS — G8929 Other chronic pain: Secondary | ICD-10-CM

## 2018-12-24 DIAGNOSIS — M5440 Lumbago with sciatica, unspecified side: Principal | ICD-10-CM

## 2019-01-22 ENCOUNTER — Other Ambulatory Visit: Payer: Self-pay | Admitting: Family Medicine

## 2019-01-22 DIAGNOSIS — G8929 Other chronic pain: Secondary | ICD-10-CM

## 2019-02-18 ENCOUNTER — Ambulatory Visit: Payer: PPO | Admitting: Family Medicine

## 2019-02-19 ENCOUNTER — Other Ambulatory Visit: Payer: Self-pay | Admitting: Family Medicine

## 2019-02-19 DIAGNOSIS — G8929 Other chronic pain: Secondary | ICD-10-CM

## 2019-02-19 DIAGNOSIS — M5440 Lumbago with sciatica, unspecified side: Secondary | ICD-10-CM

## 2019-03-03 ENCOUNTER — Telehealth: Payer: Self-pay | Admitting: Family Medicine

## 2019-03-03 DIAGNOSIS — Z794 Long term (current) use of insulin: Secondary | ICD-10-CM

## 2019-03-03 DIAGNOSIS — E1142 Type 2 diabetes mellitus with diabetic polyneuropathy: Secondary | ICD-10-CM

## 2019-03-03 NOTE — Telephone Encounter (Signed)
Patient is calling to see if Dr. Ancil Boozer can place an order to Dr. Elvina Mattes, podiatry for Diabetic Shoes & inserts [phone] Please advise

## 2019-03-06 DIAGNOSIS — M159 Polyosteoarthritis, unspecified: Secondary | ICD-10-CM | POA: Insufficient documentation

## 2019-03-06 DIAGNOSIS — M5136 Other intervertebral disc degeneration, lumbar region: Secondary | ICD-10-CM | POA: Diagnosis not present

## 2019-03-06 DIAGNOSIS — M8949 Other hypertrophic osteoarthropathy, multiple sites: Secondary | ICD-10-CM | POA: Diagnosis not present

## 2019-03-10 ENCOUNTER — Other Ambulatory Visit: Payer: Self-pay

## 2019-03-10 ENCOUNTER — Ambulatory Visit (INDEPENDENT_AMBULATORY_CARE_PROVIDER_SITE_OTHER): Payer: PPO | Admitting: Family Medicine

## 2019-03-10 ENCOUNTER — Encounter: Payer: Self-pay | Admitting: Family Medicine

## 2019-03-10 VITALS — BP 108/60 | HR 103 | Temp 97.5°F | Resp 16 | Ht 71.0 in | Wt 156.8 lb

## 2019-03-10 DIAGNOSIS — E785 Hyperlipidemia, unspecified: Secondary | ICD-10-CM | POA: Diagnosis not present

## 2019-03-10 DIAGNOSIS — Z794 Long term (current) use of insulin: Secondary | ICD-10-CM | POA: Diagnosis not present

## 2019-03-10 DIAGNOSIS — M5440 Lumbago with sciatica, unspecified side: Secondary | ICD-10-CM

## 2019-03-10 DIAGNOSIS — I10 Essential (primary) hypertension: Secondary | ICD-10-CM | POA: Diagnosis not present

## 2019-03-10 DIAGNOSIS — F341 Dysthymic disorder: Secondary | ICD-10-CM

## 2019-03-10 DIAGNOSIS — E559 Vitamin D deficiency, unspecified: Secondary | ICD-10-CM | POA: Diagnosis not present

## 2019-03-10 DIAGNOSIS — M5442 Lumbago with sciatica, left side: Secondary | ICD-10-CM

## 2019-03-10 DIAGNOSIS — E1142 Type 2 diabetes mellitus with diabetic polyneuropathy: Secondary | ICD-10-CM

## 2019-03-10 DIAGNOSIS — G629 Polyneuropathy, unspecified: Secondary | ICD-10-CM

## 2019-03-10 DIAGNOSIS — I739 Peripheral vascular disease, unspecified: Secondary | ICD-10-CM

## 2019-03-10 DIAGNOSIS — G8929 Other chronic pain: Secondary | ICD-10-CM

## 2019-03-10 LAB — POCT GLYCOSYLATED HEMOGLOBIN (HGB A1C): HbA1c, POC (controlled diabetic range): 6.8 % (ref 0.0–7.0)

## 2019-03-10 MED ORDER — VITAMIN D (ERGOCALCIFEROL) 1.25 MG (50000 UNIT) PO CAPS: 50000.0000 [IU] | ORAL_CAPSULE | ORAL | 0 refills | Status: DC

## 2019-03-10 MED ORDER — GABAPENTIN 300 MG PO CAPS: 300.0000 mg | ORAL_CAPSULE | Freq: Three times a day (TID) | ORAL | 1 refills | Status: DC

## 2019-03-10 MED ORDER — PRAVASTATIN SODIUM 20 MG PO TABS: 20.0000 mg | ORAL_TABLET | Freq: Every day | ORAL | 1 refills | Status: DC

## 2019-03-10 MED ORDER — METHOCARBAMOL 750 MG PO TABS: 750.0000 mg | ORAL_TABLET | Freq: Three times a day (TID) | ORAL | 1 refills | Status: DC

## 2019-03-10 MED ORDER — LISINOPRIL-HYDROCHLOROTHIAZIDE 20-12.5 MG PO TABS: 2.0000 | ORAL_TABLET | Freq: Every day | ORAL | 1 refills | Status: DC

## 2019-03-10 MED ORDER — LISINOPRIL-HYDROCHLOROTHIAZIDE 20-12.5 MG PO TABS: 1.5000 | ORAL_TABLET | Freq: Every day | ORAL | 0 refills | Status: DC

## 2019-03-10 NOTE — Progress Notes (Signed)
Name: Mark Nash   MRN: [id number]    DOB: 20-Nov-1955   Date:03/10/2019       Progress Note  Subjective  Chief Complaint  Chief Complaint  Patient presents with  . Medication Refill  . Diabetes  . Hypertension  . Pain    HPI  Lumbar radiculitis: he had back surgery in 06/2016 and 08/2016.He is currently doing okay. He was seeing Dr. Holley Raring, but currently under the care of Dr. Meda Coffee - seeing Rheumatologist and is now on meloxicam. She gave him 30 hydrocodone pills and states it helped some, pain today is 5/10, and still radiates down right leg  DMII: heis still on Levemir4units daily , hgbA1C was 6.8% ,6.0,6.7%,6.8%,,6.3%, 7%, 6.8% ,7.1% and it was down to 6.8% again todayHe has episodes ofpolydipsia associatedpolyuriabut nopolyphagia. He is on ace and a statin, and has neuropathy. Pain from neuropathy still intense, taking Gabapentin to tid - no longer seeing Dr. Holley Raring and needs refill of gabapentin   PAD: edema resolved, on statin therapy, cannot tolerate aspirin, still has pain on legs when he walks more than 10 minutes and has to stop and pause, seen by vascular surgeon in the past but unable to find notes today - he will see podiatrist soon. He has used compression stocking hoses and it has helped   COPD: he is not on medication, because he refuses, healways hasdaily productive cough in am's.Hehas some SOB when he starts walking but resolves within a few minutes - he takes deeps breaths to improve symptoms.. Heis still smoking between 1.5 to 2 packs of cigarettes daily Not ready to quit   Hyperlipidemia: taking Pravastatin, only half pill at night, sent 20 mg this time, so he will take one dailywe will recheck labs today   HTN: taking bp medication and denies side effects, bp today is towards low end of normal, he gets dizzy when he bends down and gets up again. We will decrease dose of medication. No chest pain or palpitation . Changing dose of  lisinopril hctz to one and half daily instead of two daily   Schizophrenia: denies any hallucinations, no depressed mood or suicidal thoughts or ideation, feeling well, not on medication and refuses to see Psychiatrist. He is feeling a little tired and worn out from carrying for family members, however denies sadness.   Hepatitis C antibody positive: he has a remote history of drug use from age 28 till 19. Used injectable drug use at the time, he has positive hepatitis C , he went to see GI but did not discuss hepatitis C. He has an appointment with GI next week   Chronic Constipation:  He had EGD and colonoscopy, past due for repeat testing secondary to large sigmoid polyp, he states not longer having problems with constipation and has follow up with GI  Senile purpura: both arms. Stable, on both upper extremity   Patient Active Problem List   Diagnosis Date Noted  . DDD (degenerative disc disease), lumbar 03/06/2019  . Primary osteoarthritis involving multiple joints 03/06/2019  . Lumbar radiculopathy 11/05/2018  . Lumbar post-laminectomy syndrome 11/05/2018  . Diabetic polyneuropathy associated with type 2 diabetes mellitus ([place]) 11/05/2018  . CLE (columnar lined esophagus)   . Gastric irritation   . Duodenum ulcer   . Personal history of colonic polyps   . Polyp of sigmoid colon   . Diverticulosis of large intestine without diverticulitis   . Benign neoplasm of ascending colon   . Osteoarthritis of both hands  09/04/2017  . Chronic pain of left knee 09/04/2017  . Abnormal MRI, knee 09/04/2017  . 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 Lymphedema 07/07/2016  . Chronic hepatitis C without hepatic coma ([place]) 06/01/2016  . Lumbar herniated disc 11/24/2015  . Thrombocytopenia ([place]) 11/11/2015  . Hepatitis C antibody test positive 07/13/2015  . Chronic constipation 07/13/2015  . Abnormal immunological findings in specimens from other organs, systems and tissues 07/13/2015   . CN (constipation) 07/13/2015  . Benign hypertension 03/08/2015  . COPD, mild ([place]) 03/08/2015  . Dyslipidemia 03/08/2015  . Paranoid schizophrenia ([place]) 03/08/2015  . Acquired polyneuropathy 03/08/2015  . Restless leg 03/08/2015  . Tobacco abuse 03/08/2015  . Callus of foot 03/08/2015  . Claudication ([place]) 03/08/2015  . Chronic obstructive pulmonary disease ([place]) 03/08/2015  . Corn or callus 03/08/2015  . Essential (primary) hypertension 03/08/2015  . HLD (hyperlipidemia) 03/08/2015  . Peripheral vascular disease ([place]) 03/08/2015  . Polyneuropathy 03/08/2015  . Current tobacco use 03/08/2015  . Vitamin D deficiency 04/26/2009  . Avitaminosis D 04/26/2009    Past Surgical History:  Procedure Laterality Date  . BACK SURGERY    . COLONOSCOPY WITH PROPOFOL N/A 09/07/2017   Procedure: COLONOSCOPY WITH PROPOFOL;  Surgeon: Virgel Manifold, MD;  Location: [facility] END[facility]COPY;  Service: Endoscopy;  Laterality: N/A;  . ESOPHAGOGASTRODUODENOSCOPY (EGD) WITH PROPOFOL N/A 12/07/2017   Procedure: ESOPHAGOGASTRODUODENOSCOPY (EGD) WITH PROPOFOL;  Surgeon: Virgel Manifold, MD;  Location: [facility] END[facility]COPY;  Service: Endoscopy;  Laterality: N/A;  . FLEXIBLE SIGMOIDOSCOPY N/A 11/09/2017   Procedure: FLEXIBLE SIGMOIDOSCOPY;  Surgeon: Virgel Manifold, MD;  Location: [facility] ENDOSCOPY;  Service: Endoscopy;  Laterality: N/A;  . HERNIA REPAIR     inguinal  . INGUINAL HERNIA REPAIR Bilateral 06/21/2018   Procedure: LAPAROSCOPIC BILATERAL INGUINAL HERNIA REPAIR;  Surgeon: Vickie Epley, MD;  Location: [facility] ORS;  Service: General;  Laterality: Bilateral;  . L3 TO L5 LAMINECTOMY FOR DECOMPRESSION  07/17/2016   [facility]  . REPAIR OF CEREBROSPINAL FLUID LEAK N/A 09/08/2016   Procedure: Lumbar wound exploration, repair of pseudomenigocele;  Surgeon: Kevan Ny Ditty, MD;  Location: [facility];  Service: Neurosurgery;  Laterality: N/A;  Lumbar wound exploration, repair of  pseudomenigocele  . SINUSOTOMY      Family History  Problem Relation Age of Onset  . Hypertension Mother   . Diverticulitis Mother   . Colitis Mother   . Diabetes Father   . Hypertension Father   . Heart disease Father   . Diverticulitis Brother   . Diabetes Sister     Social History   Socioeconomic History  . Marital status: Divorced    Spouse name: Not on file  . Number of children: 2  . Years of education: Not on file  . Highest education level: 12th grade  Occupational History  . Occupation: Disabled  Social Needs  . Financial resource strain: Not hard at all  . Food insecurity    Worry: Never true    Inability: Never true  . Transportation needs    Medical: No    Non-medical: No  Tobacco Use  . Smoking status: Current Every Day Smoker    Packs/day: 1.50    Years: 46.00    Pack years: 69.00    Types: Cigarettes  . Smokeless tobacco: Never Used  Substance and Sexual Activity  . Alcohol use: No    Alcohol/week: 0.0 standard drinks  . Drug use: No  . Sexual activity: Not Currently  Lifestyle  . Physical activity  Days per week: 1 day    Minutes per session: 20 min  . Stress: Not at all  Relationships  . Social Herbalist on phone: Three times a week    Gets together: Three times a week    Attends religious service: Never    Active member of club or organization: No    Attends meetings of clubs or organizations: Never    Relationship status: Divorced  . Intimate partner violence    Fear of current or ex partner: No    Emotionally abused: No    Physically abused: No    Forced sexual activity: No  Other Topics Concern  . Not on file  Social History Narrative   Lives alone     Current Outpatient Medications:  .  gabapentin (NEURONTIN) 300 MG capsule, Take 1 capsule (300 mg total) by mouth 3 (three) times daily., Disp: 270 capsule, Rfl: 1 .  insulin detemir (LEVEMIR) 100 UNIT/ML injection, Inject 0.04 mLs (4 Units total) into the skin  daily at 3 pm., Disp: 15 mL, Rfl: 0 .  lisinopril-hydrochlorothiazide (ZESTORETIC) 20-12.5 MG tablet, Take 2 tablets by mouth daily., Disp: 180 tablet, Rfl: 1 .  meloxicam (MOBIC) 7.5 MG tablet, Take 7.5 mg by mouth daily., Disp: , Rfl:  .  methocarbamol (ROBAXIN) 750 MG tablet, Take 1 tablet (750 mg total) by mouth 3 (three) times daily., Disp: 270 tablet, Rfl: 1 .  pravastatin (PRAVACHOL) 20 MG tablet, Take 1 tablet (20 mg total) by mouth daily with supper., Disp: 90 tablet, Rfl: 1 .  Vitamin D, Ergocalciferol, (DRISDOL) 1.25 MG (50000 UT) CAPS capsule, Take 1 capsule (50,000 Units total) by mouth every 7 (seven) days., Disp: 12 capsule, Rfl: 0  Allergies  Allergen Reactions  . Penicillins Hives and Swelling    As a child. Has patient had a PCN reaction causing immediate rash, facial/tongue/throat swelling, SOB or lightheadedness with hypotension: YES  Has patient had a PCN reaction causing severe rash involving mucus membranes or skin necrosis: UNKNOWN Has patient had a PCN reaction that required hospitalization: UNKNOWN Has patient had a PCN reaction occurring within the last 10 years: NO  . Aspirin Nausea Only  . Ibuprofen Nausea Only  . Lyrica [Pregabalin] Nausea Only  . Acetaminophen Nausea Only    I personally reviewed active problem list, medication list, allergies, family history, social history with the patient/caregiver today.   ROS  Constitutional: Negative for fever or weight change.  Respiratory: Negative for cough and shortness of breath.   Cardiovascular: Negative for chest pain or palpitations.  Gastrointestinal: Negative for abdominal pain, no bowel changes.  Musculoskeletal: positive for intermittent  gait problem from pain, but currently no  joint swelling.  Skin: Negative for rash.  Neurological: Negative for dizziness or headache.  No other specific complaints in a complete review of systems (except as listed in HPI above).  Objective  Vitals:   03/10/19  1001  BP: 108/60  Pulse: (!) 103  Resp: 16  Temp: (!) 97.5 F (36.4 C)  TempSrc: Temporal  SpO2: 99%  Weight: 156 lb 12.8 oz (71.1 kg)  Height: 5\' 11"  (1.803 m)    Body mass index is 21.87 kg/m.  Physical Exam  Constitutional: Patient appears well-developed and well-nourished.  No distress.  HEENT: head atraumatic, normocephalic, pupils equal and reactive to light,  neck supple Cardiovascular: Normal rate, regular rhythm and normal heart sounds.  No murmur heard. No BLE edema. Pulmonary/Chest: Effort normal and breath sounds  normal. No respiratory distress. Abdominal: Soft.  There is no tenderness. Psychiatric: Patient has a normal mood and affect. behavior is normal. Judgment and thought content normal.  Recent Results (from the past 2160 hour(s))  POCT HgB A1C     Status: Normal   Collection Time: 03/10/19 10:08 AM  Result Value Ref Range   Hemoglobin A1C     HbA1c POC (<> result, manual entry)     HbA1c, POC (prediabetic range)     HbA1c, POC (controlled diabetic range) 6.8 0.0 - 7.0 %     PHQ2/9: Depression screen [facility] 2/9 03/10/2019 12/20/2018 11/18/2018 11/05/2018 10/03/2018  Decreased Interest 1 0 0 0 0  Down, Depressed, Hopeless 0 0 0 0 0  PHQ - 2 Score 1 0 0 0 0  Altered sleeping 1 0 - - -  Tired, decreased energy 1 0 - - -  Change in appetite 1 0 - - -  Feeling bad or failure about yourself  0 0 - - -  Trouble concentrating 0 0 - - -  Moving slowly or fidgety/restless 0 0 - - -  Suicidal thoughts 0 0 - - -  PHQ-9 Score 4 0 - - -  Difficult doing work/chores Not difficult at all - - - -  Some recent data might be hidden    phq 9 is positive   Fall Risk: Fall Risk  03/10/2019 12/20/2018 11/18/2018 11/05/2018 10/03/2018  Falls in the past year? 0 0 0 0 0  Number falls in past yr: 0 0 - - -  Injury with Fall? 0 0 - - -  Risk for fall due to : - - - - -    Functional Status Survey: Is the patient deaf or have difficulty hearing?: No Does the patient have  difficulty seeing, even when wearing glasses/contacts?: No Does the patient have difficulty concentrating, remembering, or making decisions?: No Does the patient have difficulty walking or climbing stairs?: No Does the patient have difficulty dressing or bathing?: No Does the patient have difficulty doing errands alone such as visiting a doctor's office or shopping?: No    Assessment & Plan   1. Type 2 diabetes mellitus with diabetic polyneuropathy, with long-term current use of insulin (HCC)  - POCT HgB A1C - Urine Microalbumin w/creat. ratio - gabapentin (NEURONTIN) 300 MG capsule; Take 1 capsule (300 mg total) by mouth 3 (three) times daily.  Dispense: 270 capsule; Refill: 1 - COMPLETE METABOLIC PANEL WITH GFR  2. Acquired polyneuropathy   3. Benign hypertension  - lisinopril-hydrochlorothiazide (ZESTORETIC) 20-12.5 MG tablet; Take 2 tablets by mouth daily.  Dispense: 180 tablet; Refill: 1 - COMPLETE METABOLIC PANEL WITH GFR - CBC with Differential/Platelet  4. Chronic bilateral low back pain with sciatica, sciatica laterality unspecified  - methocarbamol (ROBAXIN) 750 MG tablet; Take 1 tablet (750 mg total) by mouth 3 (three) times daily.  Dispense: 270 tablet; Refill: 1  5. Dyslipidemia  - pravastatin (PRAVACHOL) 20 MG tablet; Take 1 tablet (20 mg total) by mouth daily with supper.  Dispense: 90 tablet; Refill: 1 - Lipid panel  6. Vitamin D deficiency  - Vitamin D, Ergocalciferol, (DRISDOL) 1.25 MG (50000 UT) CAPS capsule; Take 1 capsule (50,000 Units total) by mouth every 7 (seven) days.  Dispense: 12 capsule; Refill: 0  7. Dysthymia  Carrying for family members feeling stretched and tired, does not want medication

## 2019-03-10 NOTE — Progress Notes (Signed)
Per the request of the patient, referral has been sent to a new pain management clinic ([facility]).

## 2019-03-11 LAB — CBC WITH DIFFERENTIAL/PLATELET
Absolute Monocytes: 601 cells/uL (ref 200–950)
Basophils Absolute: 31 cells/uL (ref 0–200)
Basophils Relative: 0.4 %
Eosinophils Absolute: 185 cells/uL (ref 15–500)
Eosinophils Relative: 2.4 %
HCT: 41.9 % (ref 38.5–50.0)
Hemoglobin: 14.7 g/dL (ref 13.2–17.1)
Lymphs Abs: 1925 cells/uL (ref 850–3900)
MCH: 32 pg (ref 27.0–33.0)
MCHC: 35.1 g/dL (ref 32.0–36.0)
MCV: 91.3 fL (ref 80.0–100.0)
MPV: 10 fL (ref 7.5–12.5)
Monocytes Relative: 7.8 %
Neutro Abs: 4959 cells/uL (ref 1500–7800)
Neutrophils Relative %: 64.4 %
Platelets: 179 10*3/uL (ref 140–400)
RBC: 4.59 10*6/uL (ref 4.20–5.80)
RDW: 12.6 % (ref 11.0–15.0)
Total Lymphocyte: 25 %
WBC: 7.7 10*3/uL (ref 3.8–10.8)

## 2019-03-11 LAB — LIPID PANEL
Cholesterol: 116 mg/dL (ref <200)
HDL: 57 mg/dL (ref >=40)
LDL Cholesterol (Calc): 44 mg/dL (calc)
Non-HDL Cholesterol (Calc): 59 mg/dL (calc) (ref <130)
Total CHOL/HDL Ratio: 2 (calc) (ref <5.0)
Triglycerides: 66 mg/dL (ref <150)

## 2019-03-11 LAB — COMPLETE METABOLIC PANEL WITH GFR
AG Ratio: 2.8 (calc) — ABNORMAL HIGH (ref 1.0–2.5)
ALT: 11 U/L (ref 9–46)
AST: 12 U/L (ref 10–35)
Albumin: 4.7 g/dL (ref 3.6–5.1)
Alkaline phosphatase (APISO): 68 U/L (ref 35–144)
BUN: 16 mg/dL (ref 7–25)
CO2: 32 mmol/L (ref 20–32)
Calcium: 9.9 mg/dL (ref 8.6–10.3)
Chloride: 101 mmol/L (ref 98–110)
Creat: 0.74 mg/dL (ref 0.70–1.25)
GFR, Est African American: 114 mL/min/{1.73_m2} (ref >=60)
GFR, Est Non African American: 98 mL/min/{1.73_m2} (ref >=60)
Globulin: 1.7 g/dL (calc) — ABNORMAL LOW (ref 1.9–3.7)
Glucose, Bld: 146 mg/dL — ABNORMAL HIGH (ref 65–99)
Potassium: 4.1 mmol/L (ref 3.5–5.3)
Sodium: 139 mmol/L (ref 135–146)
Total Bilirubin: 0.9 mg/dL (ref 0.2–1.2)
Total Protein: 6.4 g/dL (ref 6.1–8.1)

## 2019-03-11 LAB — MICROALBUMIN / CREATININE URINE RATIO
Creatinine, Urine: 85 mg/dL (ref 20–320)
Microalb Creat Ratio: 4 mcg/mg creat (ref <30)
Microalb, Ur: 0.3 mg/dL

## 2019-03-19 ENCOUNTER — Ambulatory Visit: Payer: PPO | Admitting: Gastroenterology

## 2019-04-15 DIAGNOSIS — E1142 Type 2 diabetes mellitus with diabetic polyneuropathy: Secondary | ICD-10-CM | POA: Diagnosis not present

## 2019-04-16 DIAGNOSIS — M5430 Sciatica, unspecified side: Secondary | ICD-10-CM | POA: Diagnosis not present

## 2019-04-16 DIAGNOSIS — M961 Postlaminectomy syndrome, not elsewhere classified: Secondary | ICD-10-CM | POA: Diagnosis not present

## 2019-04-16 DIAGNOSIS — F1120 Opioid dependence, uncomplicated: Secondary | ICD-10-CM | POA: Diagnosis not present

## 2019-04-16 DIAGNOSIS — M545 Low back pain: Secondary | ICD-10-CM | POA: Diagnosis not present

## 2019-04-16 DIAGNOSIS — G894 Chronic pain syndrome: Secondary | ICD-10-CM | POA: Diagnosis not present

## 2019-04-16 DIAGNOSIS — M5416 Radiculopathy, lumbar region: Secondary | ICD-10-CM | POA: Diagnosis not present

## 2019-04-16 DIAGNOSIS — I10 Essential (primary) hypertension: Secondary | ICD-10-CM | POA: Diagnosis not present

## 2019-04-16 DIAGNOSIS — Z79899 Other long term (current) drug therapy: Secondary | ICD-10-CM | POA: Diagnosis not present

## 2019-04-28 DIAGNOSIS — M545 Low back pain: Secondary | ICD-10-CM | POA: Diagnosis not present

## 2019-04-28 DIAGNOSIS — I10 Essential (primary) hypertension: Secondary | ICD-10-CM | POA: Diagnosis not present

## 2019-04-28 DIAGNOSIS — M5416 Radiculopathy, lumbar region: Secondary | ICD-10-CM | POA: Diagnosis not present

## 2019-04-28 DIAGNOSIS — F1120 Opioid dependence, uncomplicated: Secondary | ICD-10-CM | POA: Diagnosis not present

## 2019-04-28 DIAGNOSIS — M961 Postlaminectomy syndrome, not elsewhere classified: Secondary | ICD-10-CM | POA: Diagnosis not present

## 2019-04-28 DIAGNOSIS — G8929 Other chronic pain: Secondary | ICD-10-CM | POA: Diagnosis not present

## 2019-04-28 DIAGNOSIS — M5430 Sciatica, unspecified side: Secondary | ICD-10-CM | POA: Diagnosis not present

## 2019-04-28 DIAGNOSIS — Z79899 Other long term (current) drug therapy: Secondary | ICD-10-CM | POA: Diagnosis not present

## 2019-04-28 DIAGNOSIS — F209 Schizophrenia, unspecified: Secondary | ICD-10-CM | POA: Diagnosis not present

## 2019-05-09 ENCOUNTER — Other Ambulatory Visit: Payer: Self-pay | Admitting: Family Medicine

## 2019-05-09 ENCOUNTER — Telehealth: Payer: Self-pay | Admitting: Family Medicine

## 2019-05-09 DIAGNOSIS — E1140 Type 2 diabetes mellitus with diabetic neuropathy, unspecified: Secondary | ICD-10-CM

## 2019-05-09 MED ORDER — INSULIN DETEMIR 100 UNIT/ML ~~LOC~~ SOLN: 4.0000 [IU] | Freq: Every day | SUBCUTANEOUS | 0 refills | Status: DC

## 2019-05-09 NOTE — Telephone Encounter (Signed)
Medication: insulin detemir (LEVEMIR) 100 UNIT/ML injection [id number]   Has the patient contacted their pharmacy? Yes  (Agent: If no, request that the patient contact the pharmacy for the refill.) (Agent: If yes, when and what did the pharmacy advise?)  Preferred Pharmacy (with phone number or street name): [facility] [address], [place] - [place] [phone] (Phone) [phone] (Fax)    Agent: Please be advised that RX refills may take up to 3 business days. We ask that you follow-up with your pharmacy.

## 2019-05-09 NOTE — Chronic Care Management (AMB) (Signed)
Chronic Care Management   Note  05/09/2019 Name: JAKARIE PEMBER MRN: [id number] DOB: 09/05/1955  ACEN CRAUN is a 64 y.o. year old male who is a primary care patient of [name], MD. I reached out to [name] by phone today in response to a referral sent by Mr. Ruairi Stutsman [facility] health plan.    Mr. Susman was given information about Chronic Care Management services today including:  1. CCM service includes personalized support from designated clinical staff supervised by his physician, including individualized plan of care and coordination with other care providers 2. 24/7 contact phone numbers for assistance for urgent and routine care needs. 3. Service will only be billed when office clinical staff spend 20 minutes or more in a month to coordinate care. 4. Only one practitioner may furnish and bill the service in a calendar month. 5. The patient may stop CCM services at any time (effective at the end of the month) by phone call to the office staff. 6. The patient will be responsible for cost sharing (co-pay) of up to 20% of the service fee (after annual deductible is met).  Patient agreed to services and verbal consent obtained.   Follow up plan: Telephone appointment with CCM team member scheduled for: 05/14/2019  [place]  ??[email or web address]   ??[phone]

## 2019-05-14 ENCOUNTER — Ambulatory Visit: Payer: PPO | Admitting: Pharmacist

## 2019-05-14 DIAGNOSIS — E1142 Type 2 diabetes mellitus with diabetic polyneuropathy: Secondary | ICD-10-CM | POA: Diagnosis not present

## 2019-05-14 DIAGNOSIS — B351 Tinea unguium: Secondary | ICD-10-CM | POA: Diagnosis not present

## 2019-05-14 DIAGNOSIS — E1159 Type 2 diabetes mellitus with other circulatory complications: Secondary | ICD-10-CM | POA: Diagnosis not present

## 2019-05-14 DIAGNOSIS — G629 Polyneuropathy, unspecified: Secondary | ICD-10-CM

## 2019-05-14 DIAGNOSIS — L851 Acquired keratosis [keratoderma] palmaris et plantaris: Secondary | ICD-10-CM | POA: Diagnosis not present

## 2019-05-14 NOTE — Chronic Care Management (AMB) (Signed)
Chronic Care Management   Note  05/14/2019 Name: Mark Nash MRN: [id number] DOB: 1955-10-11  Subjective:  Mark Nash is a 63 year old male patient of Dr. [name] referred to [facility] clinical pharmacy services by his health plan. Initial pharmacy outreach today, HIPAA identifiers verified.  Mark Nash reports no issues with affordability for his medications. He understands the indications for all medications. He feels his medications are working well for him, reports no side effects.   Objective: Lab Results  Component Value Date   CREATININE 0.74 03/10/2019   CREATININE 0.81 06/19/2018   CREATININE 0.71 02/19/2018    Lab Results  Component Value Date   HGBA1C 6.8 03/10/2019    Lipid Panel     Component Value Date/Time   CHOL 116 03/10/2019 1045   CHOL 112 07/13/2015 1018   TRIG 66 03/10/2019 1045   HDL 57 03/10/2019 1045   HDL 62 07/13/2015 1018   CHOLHDL 2.0 03/10/2019 1045   VLDL 12 12/15/2016 1405   LDLCALC 44 03/10/2019 1045    BP Readings from Last 3 Encounters:  03/10/19 108/60  12/20/18 140/60  11/18/18 (!) 149/78    Allergies  Allergen Reactions  . Penicillins Hives and Swelling    As a child. Has patient had a PCN reaction causing immediate rash, facial/tongue/throat swelling, SOB or lightheadedness with hypotension: YES  Has patient had a PCN reaction causing severe rash involving mucus membranes or skin necrosis: UNKNOWN Has patient had a PCN reaction that required hospitalization: UNKNOWN Has patient had a PCN reaction occurring within the last 10 years: NO  . Aspirin Nausea Only  . Ibuprofen Nausea Only  . Lyrica [Pregabalin] Nausea Only  . Acetaminophen Nausea Only    Medications Reviewed Today    Reviewed by Cathi Roan, [facility] (Pharmacist) on 05/14/19 at 1133  Med List Status: <None>  Medication Order Taking? Sig Documenting Provider Last Dose Status Informant  gabapentin (NEURONTIN) 300 MG capsule [id number] Yes Take 1 capsule  (300 mg total) by mouth 3 (three) times daily.  Patient taking differently: Take 300 mg by mouth 3 (three) times daily. Take 1 capsule AM, 1 capsule at noon, and 2 capsules (600mg ) at bedtime   [name], MD Taking Active   insulin detemir (LEVEMIR) 100 UNIT/ML injection [id number] Yes Inject 0.04 mLs (4 Units total) into the skin daily at 3 pm. [name], MD Taking Active   lisinopril-hydrochlorothiazide (ZESTORETIC) 20-12.5 MG tablet [id number] Yes Take 1.5 tablets by mouth daily. [name], MD Taking Active   meloxicam [facility]) 7.5 MG tablet [id number] Yes Take 7.5 mg by mouth daily. [provider] Taking Active   methocarbamol (ROBAXIN) 750 MG tablet [id number] No Take 1 tablet (750 mg total) by mouth 3 (three) times daily.  Patient not taking: Reported on 05/14/2019   [name], MD Not Taking Active   pravastatin (PRAVACHOL) 20 MG tablet [id number] Yes Take 1 tablet (20 mg total) by mouth daily with supper. [name], MD Taking Active   tiZANidine (ZANAFLEX) 2 MG tablet [id number] Yes Take 2 mg by mouth every 12 (twelve) hours. [provider] Taking Active   traMADol (ULTRAM) 50 MG tablet [id number] Yes Take by mouth every 12 (twelve) hours as needed. [provider] Taking Active   Vitamin D, Ergocalciferol, (DRISDOL) 1.25 MG (50000 UT) CAPS capsule [id number] Yes Take 1 capsule (50,000 Units total) by mouth every 7 (seven) days. [name], MD Taking Active   Med List Note Odetta Pink, Eilene Ghazi, RN  10/03/18 1438): UDS 10/03/18           Assessment:    Intervention  YES NO  Explanation   Different drug needed      Condition refractory to medication []  []     More effective medication available [x]  []  Given patient's low dose of Levemir and consistent A1c, patient is a candidate for oral DM medications; consider Jardiance or Januvia   More cost-effective medication available []  []     Other pertinent pharmacist  counseling      Goals  Addressed            This Visit's Progress   . Medicaion Optimization (pt-stated)       Current Barriers:  . Several untreated medical conditions  Pharmacist Clinical Goal(s):  [name] Over the next 30 days, patient will demonstrate improved understanding of prescribed medications and rationale for usage as evidenced by patient verbalization of medication care plan  Interventions: . Comprehensive medication review performed. [name] Updated medication list in EMR . Vitamin D deficiency: patient is not adherent to prescribed regimen, last Vitmamin D labs in 2018 indicate cholecalciferol 2000 IU daily . DM: patient well controlled and on low dose insulin, investigate switching to oral medication; needs glucometer  Patient Self Care Activities:  . Self administers medications as prescribed . Attends all scheduled provider appointments . Calls pharmacy for medication refills . Calls provider office for new concerns or questions  Initial goal documentation        Plan: Recommendations discussed with provider - Could patient be candidate for oral DM medication management only, such as Jardiance or Januvia?  - needs glucometer sent to [facility] on [address]   Follow up: Telephone follow up appointment with care management team member scheduled for: 2 weeks with PharmD  Ruben Reason, PharmD Clinical Pharmacist [facility] [phone]

## 2019-05-28 ENCOUNTER — Other Ambulatory Visit: Payer: Self-pay | Admitting: Family Medicine

## 2019-05-28 ENCOUNTER — Ambulatory Visit (INDEPENDENT_AMBULATORY_CARE_PROVIDER_SITE_OTHER): Payer: PPO | Admitting: Pharmacist

## 2019-05-28 DIAGNOSIS — E1142 Type 2 diabetes mellitus with diabetic polyneuropathy: Secondary | ICD-10-CM

## 2019-05-28 DIAGNOSIS — Z794 Long term (current) use of insulin: Secondary | ICD-10-CM | POA: Diagnosis not present

## 2019-05-28 DIAGNOSIS — G629 Polyneuropathy, unspecified: Secondary | ICD-10-CM

## 2019-05-28 MED ORDER — STEGLUJAN 15-100 MG PO TABS: 1.0000 | ORAL_TABLET | Freq: Every day | ORAL | 3 refills | Status: DC

## 2019-05-28 MED ORDER — BLOOD GLUCOSE METER KIT: PACK | 0 refills | Status: DC

## 2019-05-31 NOTE — Patient Instructions (Signed)
Goals Addressed            This Visit's Progress   . Medicaion Optimization (pt-stated)       Current Barriers:  . Several untreated medical conditions  Pharmacist Clinical Goal(s):  [name] Over the next 30 days, patient will demonstrate improved understanding of prescribed medications and rationale for usage as evidenced by patient verbalization of medication care plan  Interventions: . Comprehensive medication review performed. [name] Updated medication list in EMR . Vitamin D deficiency: patient is not adherent to prescribed regimen, last Vitmamin D labs in 2018 indicate cholecalciferol 2000 IU daily . DM: patient well controlled and on low dose insulin, investigate switching to oral medication; needs glucometer . Updated: collaboration with Dr. Ancil Boozer- new glucometer sent in to [facility];  o Counseled patient on [facility] + Januvia), patient wants to continue insulin; strongly urged patient ot reconsider  Patient Self Care Activities:  . Self administers medications as prescribed . Attends all scheduled provider appointments . Calls pharmacy for medication refills . Calls provider office for new concerns or questions  Please see past updates related to this goal by clicking on the "Past Updates" button in the selected goal

## 2019-05-31 NOTE — Chronic Care Management (AMB) (Signed)
  Chronic Care Management   Follow Up Note   05/31/2019 late entry Name: Mark Nash MRN: [id number] DOB: 1956/06/05  Subjective Mark Nash AVEER BARTOW is a 63 y.o. year old male who is a primary care patient of Mark Sizer, MD. The CCM clinical pharmacist is engaged with patient for DM management and medication optimization. Telephone folllow up, HIPAA identifiers verified.  Assessment Review of patient status, including review of consultants reports, relevant laboratory and other test results, and collaboration with appropriate care team members and the patient's provider was performed as part of comprehensive patient evaluation and provision of chronic care management services.     Outpatient Encounter Medications as of 05/28/2019  Medication Sig  . blood glucose meter kit and supplies Dispense based on patient and insurance preference. Use up to four times daily as directed. (FOR ICD-10 E10.9, E11.9).  . Ertugliflozin-SITagliptin (STEGLUJAN) 15-100 MG TABS Take 1 tablet by mouth daily.  [name] gabapentin (NEURONTIN) 300 MG capsule Take 1 capsule (300 mg total) by mouth 3 (three) times daily. (Patient taking differently: Take 300 mg by mouth 3 (three) times daily. Take 1 capsule AM, 1 capsule at noon, and 2 capsules (663m) at bedtime)  . lisinopril-hydrochlorothiazide (ZESTORETIC) 20-12.5 MG tablet Take 1.5 tablets by mouth daily.  . meloxicam (MOBIC) 7.5 MG tablet Take 7.5 mg by mouth daily.  . pravastatin (PRAVACHOL) 20 MG tablet Take 1 tablet (20 mg total) by mouth daily with supper.  .Marland KitchentiZANidine (ZANAFLEX) 2 MG tablet Take 2 mg by mouth every 12 (twelve) hours.  . traMADol (ULTRAM) 50 MG tablet Take by mouth every 12 (twelve) hours as needed.  . Vitamin D, Ergocalciferol, (DRISDOL) 1.25 MG (50000 UT) CAPS capsule Take 1 capsule (50,000 Units total) by mouth every 7 (seven) days.   No facility-administered encounter medications on file as of 05/28/2019.      Goals Addressed           This Visit's Progress   . Medicaion Optimization (pt-stated)       Current Barriers:  . Several untreated medical conditions  Pharmacist Clinical Goal(s):  .Marland KitchenOver the next 30 days, patient will demonstrate improved understanding of prescribed medications and rationale for usage as evidenced by patient verbalization of medication care plan  Interventions: . Comprehensive medication review performed. .Marland KitchenUpdated medication list in EMR . Vitamin D deficiency: patient is not adherent to prescribed regimen, last Vitmamin D labs in 2018 indicate cholecalciferol 2000 IU daily . DM: patient well controlled and on low dose insulin, investigate switching to oral medication; needs glucometer . Updated: collaboration with Dr. SAncil Boozer [facility]com[facility]  o [place] Januv[facility]), patient wants to continue insulin; strongly urged patient ot reconsider  Patient Self Care Activities:  . Self administers medications as prescribed . Attends all scheduled provider appointments . Calls pharmacy for medication refills . Calls provider office for new concerns or questions  Please see past updates related to this goal by clicking on the "Past Updates" button in the selected goal         Follow up Telephone follow up appointment with care management team member scheduled for: 7 days with PharmD  JRuben Reason PharmD Clinical Pharmacist [facility] [phone]

## 2019-06-04 ENCOUNTER — Encounter: Payer: Self-pay | Admitting: Pharmacist

## 2019-06-09 DIAGNOSIS — F1120 Opioid dependence, uncomplicated: Secondary | ICD-10-CM | POA: Diagnosis not present

## 2019-06-09 DIAGNOSIS — M961 Postlaminectomy syndrome, not elsewhere classified: Secondary | ICD-10-CM | POA: Diagnosis not present

## 2019-06-09 DIAGNOSIS — G8929 Other chronic pain: Secondary | ICD-10-CM | POA: Diagnosis not present

## 2019-06-09 DIAGNOSIS — M545 Low back pain: Secondary | ICD-10-CM | POA: Diagnosis not present

## 2019-06-09 DIAGNOSIS — F209 Schizophrenia, unspecified: Secondary | ICD-10-CM | POA: Diagnosis not present

## 2019-06-09 DIAGNOSIS — M5430 Sciatica, unspecified side: Secondary | ICD-10-CM | POA: Diagnosis not present

## 2019-06-09 DIAGNOSIS — I10 Essential (primary) hypertension: Secondary | ICD-10-CM | POA: Diagnosis not present

## 2019-06-09 DIAGNOSIS — Z79899 Other long term (current) drug therapy: Secondary | ICD-10-CM | POA: Diagnosis not present

## 2019-06-09 DIAGNOSIS — M5416 Radiculopathy, lumbar region: Secondary | ICD-10-CM | POA: Diagnosis not present

## 2019-06-30 ENCOUNTER — Other Ambulatory Visit: Payer: Self-pay | Admitting: Family Medicine

## 2019-06-30 DIAGNOSIS — I10 Essential (primary) hypertension: Secondary | ICD-10-CM

## 2019-07-01 ENCOUNTER — Ambulatory Visit: Payer: Self-pay | Admitting: Pharmacist

## 2019-07-11 ENCOUNTER — Encounter: Payer: Self-pay | Admitting: Family Medicine

## 2019-07-11 ENCOUNTER — Other Ambulatory Visit: Payer: Self-pay

## 2019-07-11 ENCOUNTER — Ambulatory Visit (INDEPENDENT_AMBULATORY_CARE_PROVIDER_SITE_OTHER): Payer: PPO | Admitting: Family Medicine

## 2019-07-11 VITALS — BP 108/68 | HR 79 | Temp 97.1°F | Resp 16 | Ht 71.0 in | Wt 156.0 lb

## 2019-07-11 DIAGNOSIS — Z794 Long term (current) use of insulin: Secondary | ICD-10-CM | POA: Diagnosis not present

## 2019-07-11 DIAGNOSIS — G8929 Other chronic pain: Secondary | ICD-10-CM

## 2019-07-11 DIAGNOSIS — J449 Chronic obstructive pulmonary disease, unspecified: Secondary | ICD-10-CM

## 2019-07-11 DIAGNOSIS — I739 Peripheral vascular disease, unspecified: Secondary | ICD-10-CM

## 2019-07-11 DIAGNOSIS — F200 Paranoid schizophrenia: Secondary | ICD-10-CM

## 2019-07-11 DIAGNOSIS — M5440 Lumbago with sciatica, unspecified side: Secondary | ICD-10-CM

## 2019-07-11 DIAGNOSIS — Z23 Encounter for immunization: Secondary | ICD-10-CM | POA: Diagnosis not present

## 2019-07-11 DIAGNOSIS — I10 Essential (primary) hypertension: Secondary | ICD-10-CM | POA: Diagnosis not present

## 2019-07-11 DIAGNOSIS — E1142 Type 2 diabetes mellitus with diabetic polyneuropathy: Secondary | ICD-10-CM

## 2019-07-11 DIAGNOSIS — D692 Other nonthrombocytopenic purpura: Secondary | ICD-10-CM

## 2019-07-11 DIAGNOSIS — L0501 Pilonidal cyst with abscess: Secondary | ICD-10-CM | POA: Diagnosis not present

## 2019-07-11 DIAGNOSIS — M5441 Lumbago with sciatica, right side: Secondary | ICD-10-CM

## 2019-07-11 DIAGNOSIS — E559 Vitamin D deficiency, unspecified: Secondary | ICD-10-CM

## 2019-07-11 DIAGNOSIS — E785 Hyperlipidemia, unspecified: Secondary | ICD-10-CM | POA: Diagnosis not present

## 2019-07-11 LAB — POCT GLYCOSYLATED HEMOGLOBIN (HGB A1C): HbA1c, POC (controlled diabetic range): 6.6 % (ref 0.0–7.0)

## 2019-07-11 MED ORDER — LISINOPRIL-HYDROCHLOROTHIAZIDE 20-12.5 MG PO TABS: 1.0000 | ORAL_TABLET | Freq: Every day | ORAL | 1 refills | Status: DC

## 2019-07-11 MED ORDER — SULFAMETHOXAZOLE-TRIMETHOPRIM 800-160 MG PO TABS: 1.0000 | ORAL_TABLET | Freq: Two times a day (BID) | ORAL | 0 refills | Status: DC

## 2019-07-11 MED ORDER — PRAVASTATIN SODIUM 20 MG PO TABS: 20.0000 mg | ORAL_TABLET | Freq: Every day | ORAL | 1 refills | Status: DC

## 2019-07-11 NOTE — Progress Notes (Signed)
Name: Mark Nash   MRN: [id number]    DOB: 14-Jul-1956   Date:07/11/2019       Progress Note  Subjective  Chief Complaint  Chief Complaint  Patient presents with  . Medication Refill    4 month F/U  . Diabetes  . COPD  . Lumbar radiculitis  . Hypertension  . Hyperlipidemia  . Schizophrenia    HPI  Lumbar radiculitis: he had back surgery in 06/2016 and 08/2016.He is currently doing okay. He was seeing Dr. Holley Raring and Dr. Meda Coffee, but had a disagreement with Dr. Holley Raring office ( front office) he is now seeing Dr. Humphrey Rolls and is taking , Tramadol, Tizanidine and higher dose gabapentin and pain is under better control , stable at 6/10 but can get worse with activity. Pain is described as sharp, aggravated by activity, and started to radiate to right groin again.   DMII: heis still on Levemir4units daily , hgbA1C was 6.8% ,6.0,6.7%,6.8%,,6.3%, 7%, 6.8% ,7.1% it was down to  6.8% and today 6.6% He has episodes ofpolydipsia associatedpolyuriabut nopolyphagia. He is on ace and a statin, and has neuropathy. Pain from neuropathy still intense, taking Gabapentin to tid  PAD: edema resolved, on statin therapy, cannot tolerate aspirin, still has pain on legs when he was unable to walk more than 10 minutes without pausing, but since started on pain medication he can walk for 15 minutes , seen by vascular surgeon in the past but unable to find notes today, he has not been wearing compression stocking hoses lately, just diabetic socks .   COPD: he is not on medication, because he refuses, healways hasdaily productive cough in am's.Hehas some SOB when he starts walking but resolves within a few minutes - he takes deeps breaths to improve symptoms.. Heis still smoking between 1.5 to 2 packs of cigarettes daily, he does not want to quit   Hyperlipidemia: taking Pravastatin 20 mg daily and denies myalgias.   HTN: taking bp medication and denies side effects, bp today is towards low  end of normal, he gets dizzy when he bends down and gets up again. We will decrease dose of medication. No chest pain or palpitation . Changing dose of lisinopril hctz to one daily   Schizophrenia: denies any hallucinations, no depressed mood or suicidal thoughts or ideation, feeling well, not on medication and refuses to see Psychiatrist.He is feeling a little tired and worn out from carrying for family members, however denies sadness.    Hepatitis C antibody positive: he has a remote history of drug use from age 84 till 66. Used injectable drug use at the time, he has positive hepatitis C , he went to see GI but did not discuss hepatitis C.He has seen GI, he has a large polyp removed last year and was supposed to go back for repeat colonoscopy but he cannot afford it at this time  Chronic Constipation:  He had EGD and colonoscopy,past due for repeat testing secondary to large sigmoid polyp, he cannot afford repeat study at this time  Senile purpura: both arms.on both upper extremity . Unchanged   Recurrent boils on gluteal folds: going on for months and getting progressively worse , this time it is painful even to sit down.   Patient Active Problem List   Diagnosis Date Noted  . DDD (degenerative disc disease), lumbar 03/06/2019  . Primary osteoarthritis involving multiple joints 03/06/2019  . Lumbar radiculopathy 11/05/2018  . Lumbar post-laminectomy syndrome 11/05/2018  . Diabetic polyneuropathy associated with type  2 diabetes mellitus ([place]) 11/05/2018  . CLE (columnar lined esophagus)   . Gastric irritation   . Duodenum ulcer   . Personal history of colonic polyps   . Polyp of sigmoid colon   . Diverticulosis of large intestine without diverticulitis   . Benign neoplasm of ascending colon   . Osteoarthritis of both hands 09/04/2017  . Chronic pain of left knee 09/04/2017  . Abnormal MRI, knee 09/04/2017  . 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 Lymphedema  07/07/2016  . Chronic hepatitis C without hepatic coma ([place]) 06/01/2016  . Lumbar herniated disc 11/24/2015  . Thrombocytopenia ([place]) 11/11/2015  . Hepatitis C antibody test positive 07/13/2015  . Chronic constipation 07/13/2015  . Abnormal immunological findings in specimens from other organs, systems and tissues 07/13/2015  . CN (constipation) 07/13/2015  . Benign hypertension 03/08/2015  . COPD, mild ([place]) 03/08/2015  . Dyslipidemia 03/08/2015  . Paranoid schizophrenia ([place]) 03/08/2015  . Acquired polyneuropathy 03/08/2015  . Restless leg 03/08/2015  . Tobacco abuse 03/08/2015  . Callus of foot 03/08/2015  . Claudication ([place]) 03/08/2015  . Chronic obstructive pulmonary disease ([place]) 03/08/2015  . Corn or callus 03/08/2015  . Essential (primary) hypertension 03/08/2015  . HLD (hyperlipidemia) 03/08/2015  . Peripheral vascular disease ([place]) 03/08/2015  . Polyneuropathy 03/08/2015  . Current tobacco use 03/08/2015  . Vitamin D deficiency 04/26/2009  . Avitaminosis D 04/26/2009    Past Surgical History:  Procedure Laterality Date  . BACK SURGERY    . COLONOSCOPY WITH PROPOFOL N/A 09/07/2017   Procedure: COLONOSCOPY WITH PROPOFOL;  Surgeon: Virgel Manifold, MD;  Location: [facility] END[facility]COPY;  Service: Endoscopy;  Laterality: N/A;  . ESOPHAGOGASTRODUODENOSCOPY (EGD) WITH PROPOFOL N/A 12/07/2017   Procedure: ESOPHAGOGASTRODUODENOSCOPY (EGD) WITH PROPOFOL;  Surgeon: Virgel Manifold, MD;  Location: [facility] END[facility]COPY;  Service: Endoscopy;  Laterality: N/A;  . FLEXIBLE SIGMOIDOSCOPY N/A 11/09/2017   Procedure: FLEXIBLE SIGMOIDOSCOPY;  Surgeon: Virgel Manifold, MD;  Location: [facility] ENDOSCOPY;  Service: Endoscopy;  Laterality: N/A;  . HERNIA REPAIR     inguinal  . INGUINAL HERNIA REPAIR Bilateral 06/21/2018   Procedure: LAPAROSCOPIC BILATERAL INGUINAL HERNIA REPAIR;  Surgeon: Vickie Epley, MD;  Location: [facility] ORS;  Service: General;  Laterality: Bilateral;  . L3 TO L5  LAMINECTOMY FOR DECOMPRESSION  07/17/2016   [facility]  . REPAIR OF CEREBROSPINAL FLUID LEAK N/A 09/08/2016   Procedure: Lumbar wound exploration, repair of pseudomenigocele;  Surgeon: Kevan Ny Ditty, MD;  Location: [place];  Service: Neurosurgery;  Laterality: N/A;  Lumbar wound exploration, repair of pseudomenigocele  . SINUSOTOMY      Family History  Problem Relation Age of Onset  . Hypertension Mother   . Diverticulitis Mother   . Colitis Mother   . Diabetes Father   . Hypertension Father   . Heart disease Father   . Diverticulitis Brother   . Diabetes Sister     Social History   Socioeconomic History  . Marital status: Divorced    Spouse name: Not on file  . Number of children: 2  . Years of education: Not on file  . Highest education level: 12th grade  Occupational History  . Occupation: Disabled  Social Needs  . Financial resource strain: Not hard at all  . Food insecurity    Worry: Never true    Inability: Never true  . Transportation needs    Medical: No    Non-medical: No  Tobacco Use  . Smoking status: Current Every Day Smoker  Packs/day: 1.50    Years: 46.00    Pack years: 69.00    Types: Cigarettes  . Smokeless tobacco: Never Used  Substance and Sexual Activity  . Alcohol use: No    Alcohol/week: 0.0 standard drinks  . Drug use: No  . Sexual activity: Not Currently  Lifestyle  . Physical activity    Days per week: 4 days    Minutes per session: 10 min  . Stress: Not at all  Relationships  . Social Herbalist on phone: Three times a week    Gets together: Three times a week    Attends religious service: Never    Active member of club or organization: No    Attends meetings of clubs or organizations: Never    Relationship status: Divorced  . Intimate partner violence    Fear of current or ex partner: No    Emotionally abused: No    Physically abused: No    Forced sexual activity: No  Other Topics Concern   . Not on file  Social History Narrative   Lives alone     Current Outpatient Medications:  .  blood glucose meter kit and supplies, Dispense based on patient and insurance preference. Use up to four times daily as directed. (FOR ICD-10 E10.9, E11.9)., Disp: 1 each, Rfl: 0 .  gabapentin (NEURONTIN) 300 MG capsule, Take 1 capsule (300 mg total) by mouth 3 (three) times daily. (Patient taking differently: Take 300 mg by mouth 3 (three) times daily. Take 1 capsule AM, 1 capsule at noon, and 2 capsules ('600mg'$ ) at bedtime), Disp: 270 capsule, Rfl: 1 .  insulin detemir (LEVEMIR) 100 UNIT/ML injection, Inject 4 Units into the skin daily., Disp: , Rfl:  .  lisinopril-hydrochlorothiazide (ZESTORETIC) 20-12.5 MG tablet, TAKE 1 & 1/2 (ONE & ONE-HALF) TABLETS BY MOUTH ONCE DAILY, Disp: 135 tablet, Rfl: 0 .  meloxicam (MOBIC) 7.5 MG tablet, Take 7.5 mg by mouth daily., Disp: , Rfl:  .  pravastatin (PRAVACHOL) 20 MG tablet, Take 1 tablet (20 mg total) by mouth daily with supper., Disp: 90 tablet, Rfl: 1 .  tiZANidine (ZANAFLEX) 2 MG tablet, Take 2 mg by mouth every 12 (twelve) hours., Disp: , Rfl:  .  traMADol (ULTRAM) 50 MG tablet, Take by mouth every 12 (twelve) hours as needed., Disp: , Rfl:  .  Vitamin D, Ergocalciferol, (DRISDOL) 1.25 MG (50000 UT) CAPS capsule, Take 1 capsule (50,000 Units total) by mouth every 7 (seven) days., Disp: 12 capsule, Rfl: 0 .  Ertugliflozin-SITagliptin (STEGLUJAN) 15-100 MG TABS, Take 1 tablet by mouth daily. (Patient not taking: Reported on 07/11/2019), Disp: 90 tablet, Rfl: 3  Allergies  Allergen Reactions  . Penicillins Hives and Swelling    As a child. Has patient had a PCN reaction causing immediate rash, facial/tongue/throat swelling, SOB or lightheadedness with hypotension: YES  Has patient had a PCN reaction causing severe rash involving mucus membranes or skin necrosis: UNKNOWN Has patient had a PCN reaction that required hospitalization: UNKNOWN Has patient  had a PCN reaction occurring within the last 10 years: NO  . Aspirin Nausea Only  . Ibuprofen Nausea Only  . Lyrica [Pregabalin] Nausea Only  . Acetaminophen Nausea Only    I personally reviewed active problem list, medication list, allergies, family history, social history, health maintenance with the patient/caregiver today.   ROS  Constitutional: Negative for fever or weight change.  Respiratory: Negative for cough and shortness of breath.   Cardiovascular: Negative for chest  pain or palpitations.  Gastrointestinal: Negative for abdominal pain, no bowel changes.  Musculoskeletal: Positive for gait problem but no  joint swelling.  Skin: Negative for rash.  Neurological: Negative for dizziness or headache.  No other specific complaints in a complete review of systems (except as listed in HPI above).  Objective  Vitals:   07/11/19 1322  BP: 108/68  Pulse: 79  Resp: 16  Temp: (!) 97.1 F (36.2 C)  TempSrc: Temporal  SpO2: 97%  Weight: 156 lb (70.8 kg)  Height: _0  (1.803 m)    Body mass index is 21.76 kg/m.  Physical Exam  Constitutional: Patient appears well-developed and well-nourished.  No distress.  HEENT: head atraumatic, normocephalic, pupils equal and reactive to light Cardiovascular: Normal rate, regular rhythm and normal heart sounds.  No murmur heard. No BLE edema. Pulmonary/Chest: Effort normal and breath sounds normal. No respiratory distress. Abdominal: Soft.  There is no tenderness. Muscular skeletal: tender during palpation of lumbar spine Skin: tender area with induration on gluteal fold, above anus, other areas of scarring from previous episodes. Explained it may be pilonidal cyst we will give antibiotics, sitz baths and refer to surgeon Psychiatric: Patient has a normal mood and affect. behavior is normal. Judgment and thought content normal.  PHQ2/9: Depression screen [facility] 2/9 07/11/2019 03/10/2019 12/20/2018 11/18/2018 11/05/2018  Decreased  Interest 0 1 0 0 0  Down, Depressed, Hopeless 0 0 0 0 0  PHQ - 2 Score 0 1 0 0 0  Altered sleeping 0 1 0 - -  Tired, decreased energy 1 1 0 - -  Change in appetite 0 1 0 - -  Feeling bad or failure about yourself  0 0 0 - -  Trouble concentrating 0 0 0 - -  Moving slowly or fidgety/restless 0 0 0 - -  Suicidal thoughts 0 0 0 - -  PHQ-9 Score 1 4 0 - -  Difficult doing work/chores Not difficult at all Not difficult at all - - -  Some recent data might be hidden    phq 9 is negative   Fall Risk: Fall Risk  07/11/2019 03/10/2019 12/20/2018 11/18/2018 11/05/2018  Falls in the past year? 0 0 0 0 0  Number falls in past yr: 0 0 0 - -  Injury with Fall? 0 0 0 - -  Risk for fall due to : - - - - -     Functional Status Survey: Is the patient deaf or have difficulty hearing?: No Does the patient have difficulty seeing, even when wearing glasses/contacts?: No Does the patient have difficulty concentrating, remembering, or making decisions?: No Does the patient have difficulty walking or climbing stairs?: No Does the patient have difficulty dressing or bathing?: No Does the patient have difficulty doing errands alone such as visiting a doctor's office or shopping?: No    Assessment & Plan  1. Encounter for immunization  - Flu Vaccine MDCK QUAD PF  2. Claudication ([place])   3. Benign hypertension  - lisinopril-hydrochlorothiazide (ZESTORETIC) 20-12.5 MG tablet; Take 1 tablet by mouth daily.  Dispense: 90 tablet; Refill: 1  4. Type 2 diabetes mellitus with diabetic polyneuropathy, with long-term current use of insulin (HCC)  - POCT HgB A1C  5. Chronic bilateral low back pain with sciatica, sciatica laterality unspecified  Seeing Dr. Humphrey Rolls   6. Paranoid schizophrenia ([place])  No problems recently  7. COPD, mild ([place])  Refuses to quit smoking   8. Purpura, nonthrombocytopenic (HCC)  stable  9.  Vitamin D deficiency  Continue supplementation   10. Dyslipidemia  -  pravastatin (PRAVACHOL) 20 MG tablet; Take 1 tablet (20 mg total) by mouth daily with supper.  Dispense: 90 tablet; Refill: 1   11. Sacrococcygeal pilonidal cyst with abscess  - sulfamethoxazole-trimethoprim (BACTRIM DS) 800-160 MG tablet; Take 1 tablet by mouth 2 (two) times daily.  Dispense: 14 tablet; Refill: 0  - referral Dr. Rosana Hoes  Patient states difficulty tolerating other antibiotics

## 2019-07-15 ENCOUNTER — Ambulatory Visit: Payer: PPO | Admitting: Surgery

## 2019-07-16 ENCOUNTER — Ambulatory Visit: Payer: PPO | Admitting: Gastroenterology

## 2019-07-17 ENCOUNTER — Ambulatory Visit: Payer: PPO | Admitting: Surgery

## 2019-07-22 ENCOUNTER — Ambulatory Visit: Payer: PPO | Admitting: Surgery

## 2019-07-23 NOTE — Chronic Care Management (AMB) (Signed)
  Chronic Care Management   Note  07/23/2019 Name: Mark Nash MRN: [id number] DOB: [birth year]  63 y.o. year old male referred to Chronic Care Management by health plan for medication adherence  Was unable to reach patient via telephone today and have left HIPAA compliant voicemail asking patient to return my call. (unsuccessful outreach #[phone]).  Follow up plan: A HIPPA compliant phone message was left for the patient providing contact information and requesting a return call.  The care management team will reach out to the patient again over the next 21 days.   Ruben Reason, PharmD Clinical Pharmacist [facility] [phone]

## 2019-07-29 ENCOUNTER — Encounter: Payer: Self-pay | Admitting: Surgery

## 2019-07-29 ENCOUNTER — Ambulatory Visit: Payer: PPO | Admitting: Surgery

## 2019-07-29 ENCOUNTER — Other Ambulatory Visit: Payer: Self-pay

## 2019-07-29 VITALS — BP 131/76 | HR 73 | Temp 97.6°F | Resp 14 | Ht 71.0 in | Wt 151.4 lb

## 2019-07-29 DIAGNOSIS — L723 Sebaceous cyst: Secondary | ICD-10-CM | POA: Diagnosis not present

## 2019-07-29 NOTE — Progress Notes (Signed)
Patient ID: Mark Nash, male   DOB: 15-Jun-1956, 63 y.o.   MRN: [id number]  Chief Complaint: Evaluate for Pilonidal  disease History of Present Illness Mark Nash is a 63 y.o. male with prior history approximately 8 months ago of natal cleft abscess which spontaneously burst.  He had it evaluated more recently.  He denies any persistent or recurring drainage, tenderness or induration or lingering issues.  Past Medical History Past Medical History:  Diagnosis Date  . Arthritis   . Asthma   . Benign neoplasm of ascending colon    polyps  . Constipation   . COPD (chronic obstructive pulmonary disease) ([place])    NO INHALER  . Diabetes mellitus without complication (HCC)    Type 2  . Diverticulosis   . Dyspnea    RARE-WITH EXERTION DUE TO COPD  . GERD (gastroesophageal reflux disease)   . Hepatitis C   . History of kidney stones    H/O  . Hyperlipidemia   . Hypertension 06/10/2018   currently not under control  . Left inguinal hernia   . Peripheral vascular disease ([place])   . Restless legs   . Schizoaffective disorder ([place])    Pt denies  . Thrombocytopenia ([place])   . Vitamin D deficiency       Past Surgical History:  Procedure Laterality Date  . BACK SURGERY    . COLONOSCOPY WITH PROPOFOL N/A 09/07/2017   Procedure: COLONOSCOPY WITH PROPOFOL;  Surgeon: Virgel Manifold, MD;  Location: [facility];  Service: Endoscopy;  Laterality: N/A;  . ESOPHAGOGASTRODUODENOSCOPY (EGD) WITH PROPOFOL N/A 12/07/2017   Procedure: ESOPHAGOGASTRODUODENOSCOPY (EGD) WITH PROPOFOL;  Surgeon: Virgel Manifold, MD;  Location: [facility];  Service: Endoscopy;  Laterality: N/A;  . FLEXIBLE SIGMOIDOSCOPY N/A 11/09/2017   Procedure: FLEXIBLE SIGMOIDOSCOPY;  Surgeon: Virgel Manifold, MD;  Location: [facility];  Service: Endoscopy;  Laterality: N/A;  . HERNIA REPAIR     inguinal  . INGUINAL HERNIA REPAIR Bilateral 06/21/2018   Procedure: LAPAROSCOPIC BILATERAL INGUINAL HERNIA  REPAIR;  Surgeon: Vickie Epley, MD;  Location: [facility];  Service: General;  Laterality: Bilateral;  . L3 TO L5 LAMINECTOMY FOR DECOMPRESSION  07/17/2016   [facility]  . REPAIR OF CEREBROSPINAL FLUID LEAK N/A 09/08/2016   Procedure: Lumbar wound exploration, repair of pseudomenigocele;  Surgeon: Kevan Ny Ditty, MD;  Location: [place];  Service: Neurosurgery;  Laterality: N/A;  Lumbar wound exploration, repair of pseudomenigocele  . SINUSOTOMY      Allergies  Allergen Reactions  . Penicillins Hives and Swelling    As a child. Has patient had a PCN reaction causing immediate rash, facial/tongue/throat swelling, SOB or lightheadedness with hypotension: YES  Has patient had a PCN reaction causing severe rash involving mucus membranes or skin necrosis: UNKNOWN Has patient had a PCN reaction that required hospitalization: UNKNOWN Has patient had a PCN reaction occurring within the last 10 years: NO  . Aspirin Nausea Only  . Ibuprofen Nausea Only  . Lyrica [Pregabalin] Nausea Only  . Acetaminophen Nausea Only    Current Outpatient Medications  Medication Sig Dispense Refill  . blood glucose meter kit and supplies Dispense based on patient and insurance preference. Use up to four times daily as directed. (FOR ICD-10 E10.9, E11.9). 1 each 0  . insulin detemir (LEVEMIR) 100 UNIT/ML injection Inject 4 Units into the skin daily.    [name] lisinopril-hydrochlorothiazide (ZESTORETIC) 20-12.5 MG tablet Take 1 tablet by mouth daily. 90 tablet 1  .  meloxicam (MOBIC) 7.5 MG tablet Take 7.5 mg by mouth daily.    . pravastatin (PRAVACHOL) 20 MG tablet Take 1 tablet (20 mg total) by mouth daily with supper. 90 tablet 1  . tiZANidine (ZANAFLEX) 2 MG tablet Take 2 mg by mouth every 12 (twelve) hours.    . traMADol (ULTRAM) 50 MG tablet Take by mouth every 12 (twelve) hours as needed.    . Vitamin D, Ergocalciferol, (DRISDOL) 1.25 MG (50000 UT) CAPS capsule Take 1 capsule (50,000 Units  total) by mouth every 7 (seven) days. 12 capsule 0  . gabapentin (NEURONTIN) 300 MG capsule Take 1 capsule (300 mg total) by mouth 3 (three) times daily. (Patient taking differently: Take 300 mg by mouth 3 (three) times daily. Take 1 capsule AM, 1 capsule at noon, and 2 capsules (699m) at bedtime) 270 capsule 1   No current facility-administered medications for this visit.     Family History Family History  Problem Relation Age of Onset  . Hypertension Mother   . Diverticulitis Mother   . Colitis Mother   . Diabetes Father   . Hypertension Father   . Heart disease Father   . Diverticulitis Brother   . Diabetes Sister       Social History Social History   Tobacco Use  . Smoking status: Current Every Day Smoker    Packs/day: 1.50    Years: 46.00    Pack years: 69.00    Types: Cigarettes  . Smokeless tobacco: Never Used  Substance Use Topics  . Alcohol use: No    Alcohol/week: 0.0 standard drinks  . Drug use: No        Review of Systems  Constitutional: Negative.   Respiratory: Negative.   Cardiovascular: Negative.   Gastrointestinal: Negative.   Skin: Negative.       Physical Exam Blood pressure 131/76, pulse 73, temperature 97.6 F (36.4 C), temperature source Temporal, resp. rate 14, height '5\' 11"'  (1.803 m), weight 151 lb 6.4 oz (68.7 kg), SpO2 97 %. Last Weight  Most recent update: 07/29/2019  1:37 PM   Weight  68.7 kg (151 lb 6.4 oz)            CONSTITUTIONAL: Well developed, and nourished, appropriately responsive and aware without distress.  EYES: Sclera non-icteric.   EARS, NOSE, MOUTH AND THROAT: Mask worn. Hearing is intact to voice.  NECK: Trachea is midline, and there is no jugular venous distension.  RESPIRATORY:  Lungs are clear, and breath sounds are equal bilaterally. Normal respiratory effort without pathologic use of accessory muscles. GI: The abdomen is nontender, and nondistended.   MUSCULOSKELETAL:  Symmetrical muscle tone appreciated  in all four extremities.    SKIN: Skin turgor is normal. No pathologic skin lesions appreciated.  There are no pits within the natal cleft, no remarkable scarring present, no fluctuance, induration or erythema present.  Is nontender.  He there is a paucity of subcutaneous fat in this region. NEUROLOGIC:  Motor and sensation appear grossly normal.  Cranial nerves are grossly without defect. PSYCH:  Alert and oriented to person, place and time. Affect is appropriate for situation.  Data Reviewed I have personally reviewed what is currently available of the patient's imaging, recent labs and medical records.      Assessment    History of natal cleft abscess, spontaneously drained and resolved.  No appreciable persistence of pilonidal sinus disease Patient Active Problem List   Diagnosis Date Noted  . DDD (degenerative disc disease), lumbar 03/06/2019  .  Primary osteoarthritis involving multiple joints 03/06/2019  . Lumbar radiculopathy 11/05/2018  . Lumbar post-laminectomy syndrome 11/05/2018  . Diabetic polyneuropathy associated with type 2 diabetes mellitus ([place]) 11/05/2018  . CLE (columnar lined esophagus)   . Gastric irritation   . Duodenum ulcer   . Personal history of colonic polyps   . Polyp of sigmoid colon   . Diverticulosis of large intestine without diverticulitis   . Benign neoplasm of ascending colon   . Osteoarthritis of both hands 09/04/2017  . Chronic pain of left knee 09/04/2017  . Abnormal MRI, knee 09/04/2017  . 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 Lymphedema 07/07/2016  . Chronic hepatitis C without hepatic coma ([place]) 06/01/2016  . Lumbar herniated disc 11/24/2015  . Thrombocytopenia ([place]) 11/11/2015  . Hepatitis C antibody test positive 07/13/2015  . Chronic constipation 07/13/2015  . Abnormal immunological findings in specimens from other organs, systems and tissues 07/13/2015  . CN (constipation) 07/13/2015  . Benign hypertension 03/08/2015  .  COPD, mild ([place]) 03/08/2015  . Dyslipidemia 03/08/2015  . Paranoid schizophrenia ([place]) 03/08/2015  . Acquired polyneuropathy 03/08/2015  . Restless leg 03/08/2015  . Tobacco abuse 03/08/2015  . Callus of foot 03/08/2015  . Claudication ([place]) 03/08/2015  . Chronic obstructive pulmonary disease ([place]) 03/08/2015  . Corn or callus 03/08/2015  . Essential (primary) hypertension 03/08/2015  . HLD (hyperlipidemia) 03/08/2015  . Peripheral vascular disease ([place]) 03/08/2015  . Polyneuropathy 03/08/2015  . Current tobacco use 03/08/2015  . Vitamin D deficiency 04/26/2009  . Avitaminosis D 04/26/2009    Plan    Advised should a flareup recur, pain/swelling as he is previously experienced to report to [place] for further attention.  Believe he understands there is minimal intervention we can do to prevent this, and as there is no active evidence of pilonidal sinus tracts no role for elective surgery.  Face-to-face time spent with the patient and accompanying care providers(if present) was 20 minutes, with more than 50% of the time spent counseling, educating, and coordinating care of the patient.      Ronny Bacon 07/29/2019, 1:48 PM

## 2019-07-29 NOTE — Patient Instructions (Addendum)
Please call our office if you have questions or concerns.    Pilonidal Cyst  A pilonidal cyst is a fluid-filled sac that forms beneath the skin near the tailbone, at the top of the crease of the buttocks (pilonidal area). If the cyst is not large and not infected, it may not cause any problems. If the cyst becomes irritated or infected, it may get larger and fill with pus. An infected cyst is called an abscess. A pilonidal abscess may cause pain and swelling, and it may need to be drained or removed. What are the causes? The cause of this condition is not always known. In some cases, a hair that grows into your skin (ingrown hair) may be the cause. What increases the risk? You are more likely to get a pilonidal cyst if you:  Are male.  Have lots of hair near the crease of the buttocks.  Are overweight.  Have a dimple near the crease of the buttocks.  Wear tight clothing.  Do not bathe or shower often.  Sit for long periods of time. What are the signs or symptoms? Signs and symptoms of a pilonidal cyst may include pain, swelling, redness, and warmth in the pilonidal area. Depending on how big the cyst is, you may be able to feel a lump near your tailbone. If your cyst becomes infected, symptoms may include:  Pus or fluid drainage.  Fever.  Pain, swelling, and redness getting worse.  The lump getting bigger. How is this diagnosed? This condition may be diagnosed based on:  Your symptoms and medical history.  A physical exam.  A blood test to check for infection.  Testing a pus sample, if applicable. How is this treated? If your cyst does not cause symptoms, you may not need any treatment. If your cyst bothers you or is infected, you may need a procedure to drain or remove the cyst. Depending on the size, location, and severity of your cyst, your health care provider may:  Make an incision in the cyst and drain it (incision and drainage).  Open and drain the cyst, and  then stitch the wound so that it stays open while it heals (marsupialization). You will be given instructions about how to care for your open wound while it heals.  Remove all or part of the cyst, and then close the wound (cyst removal). You may need to take antibiotic medicines before your procedure. Follow these instructions at home: Medicines  Take over-the-counter and prescription medicines only as told by your health care provider.  If you were prescribed an antibiotic medicine, take it as told by your health care provider. Do not stop taking the antibiotic even if you start to feel better. General instructions  Keep the area around your pilonidal cyst clean and dry.  If there is fluid or pus draining from your cyst: ? Cover the area with a clean bandage (dressing) as needed. ? Wash the area gently with soap and water. Pat the area dry with a clean towel. Do not rub the area because that may cause bleeding.  Remove hair from the area around the cyst only if your health care provider tells you to do this.  Do not wear tight pants or sit in one position for long periods at a time.  Keep all follow-up visits as told by your health care provider. This is important. Contact a health care provider if you have:  New redness, swelling, or pain.  A fever.  Severe pain.  Summary  A pilonidal cyst is a fluid-filled sac that forms beneath the skin near the tailbone, at the top of the crease of the buttocks (pilonidal area).  If the cyst becomes irritated or infected, it may get larger and fill with pus. An infected cyst is called an abscess.  The cause of this condition is not always known. In some cases, a hair that grows into your skin (ingrown hair) may be the cause.  If your cyst does not cause symptoms, you may not need any treatment. If your cyst bothers you or is infected, you may need a procedure to drain or remove the cyst. This information is not intended to replace advice  given to you by your health care provider. Make sure you discuss any questions you have with your health care provider. Document Released: 08/11/2000 Document Revised: 08/02/2017 Document Reviewed: 08/02/2017 Elsevier Patient Education  [place].

## 2019-08-04 ENCOUNTER — Other Ambulatory Visit: Payer: Self-pay | Admitting: Family Medicine

## 2019-08-04 DIAGNOSIS — F1120 Opioid dependence, uncomplicated: Secondary | ICD-10-CM | POA: Diagnosis not present

## 2019-08-04 DIAGNOSIS — M79605 Pain in left leg: Secondary | ICD-10-CM

## 2019-08-04 DIAGNOSIS — G894 Chronic pain syndrome: Secondary | ICD-10-CM | POA: Diagnosis not present

## 2019-08-04 DIAGNOSIS — M545 Low back pain: Secondary | ICD-10-CM | POA: Diagnosis not present

## 2019-08-04 DIAGNOSIS — M5430 Sciatica, unspecified side: Secondary | ICD-10-CM | POA: Diagnosis not present

## 2019-08-04 DIAGNOSIS — I10 Essential (primary) hypertension: Secondary | ICD-10-CM | POA: Diagnosis not present

## 2019-08-04 DIAGNOSIS — F209 Schizophrenia, unspecified: Secondary | ICD-10-CM | POA: Diagnosis not present

## 2019-08-04 DIAGNOSIS — M79604 Pain in right leg: Secondary | ICD-10-CM

## 2019-08-04 DIAGNOSIS — Z79899 Other long term (current) drug therapy: Secondary | ICD-10-CM | POA: Diagnosis not present

## 2019-08-04 DIAGNOSIS — M5416 Radiculopathy, lumbar region: Secondary | ICD-10-CM | POA: Diagnosis not present

## 2019-08-04 DIAGNOSIS — R202 Paresthesia of skin: Secondary | ICD-10-CM

## 2019-09-29 DIAGNOSIS — Z79899 Other long term (current) drug therapy: Secondary | ICD-10-CM | POA: Diagnosis not present

## 2019-09-29 DIAGNOSIS — M5416 Radiculopathy, lumbar region: Secondary | ICD-10-CM | POA: Diagnosis not present

## 2019-09-29 DIAGNOSIS — M545 Low back pain: Secondary | ICD-10-CM | POA: Diagnosis not present

## 2019-09-29 DIAGNOSIS — F209 Schizophrenia, unspecified: Secondary | ICD-10-CM | POA: Diagnosis not present

## 2019-09-29 DIAGNOSIS — M5430 Sciatica, unspecified side: Secondary | ICD-10-CM | POA: Diagnosis not present

## 2019-09-29 DIAGNOSIS — G894 Chronic pain syndrome: Secondary | ICD-10-CM | POA: Diagnosis not present

## 2019-09-29 DIAGNOSIS — I10 Essential (primary) hypertension: Secondary | ICD-10-CM | POA: Diagnosis not present

## 2019-09-29 DIAGNOSIS — G8929 Other chronic pain: Secondary | ICD-10-CM | POA: Diagnosis not present

## 2019-09-29 DIAGNOSIS — F1120 Opioid dependence, uncomplicated: Secondary | ICD-10-CM | POA: Diagnosis not present

## 2019-09-29 DIAGNOSIS — M961 Postlaminectomy syndrome, not elsewhere classified: Secondary | ICD-10-CM | POA: Diagnosis not present

## 2019-10-13 DIAGNOSIS — L851 Acquired keratosis [keratoderma] palmaris et plantaris: Secondary | ICD-10-CM | POA: Diagnosis not present

## 2019-10-13 DIAGNOSIS — B351 Tinea unguium: Secondary | ICD-10-CM | POA: Diagnosis not present

## 2019-10-13 DIAGNOSIS — E1142 Type 2 diabetes mellitus with diabetic polyneuropathy: Secondary | ICD-10-CM | POA: Diagnosis not present

## 2019-10-13 DIAGNOSIS — M67371 Transient synovitis, right ankle and foot: Secondary | ICD-10-CM | POA: Diagnosis not present

## 2019-10-13 DIAGNOSIS — M79671 Pain in right foot: Secondary | ICD-10-CM | POA: Diagnosis not present

## 2019-11-06 NOTE — Progress Notes (Signed)
This encounter was created in error - please disregard.

## 2019-11-10 ENCOUNTER — Ambulatory Visit (INDEPENDENT_AMBULATORY_CARE_PROVIDER_SITE_OTHER): Payer: PPO | Admitting: Family Medicine

## 2019-11-10 ENCOUNTER — Encounter: Payer: Self-pay | Admitting: Family Medicine

## 2019-11-10 VITALS — BP 140/70 | HR 66 | Temp 97.5°F | Resp 16 | Ht 71.0 in | Wt 157.5 lb

## 2019-11-10 DIAGNOSIS — I10 Essential (primary) hypertension: Secondary | ICD-10-CM | POA: Diagnosis not present

## 2019-11-10 DIAGNOSIS — I739 Peripheral vascular disease, unspecified: Secondary | ICD-10-CM | POA: Diagnosis not present

## 2019-11-10 DIAGNOSIS — D692 Other nonthrombocytopenic purpura: Secondary | ICD-10-CM

## 2019-11-10 DIAGNOSIS — Z860100 Personal history of colon polyps, unspecified: Secondary | ICD-10-CM

## 2019-11-10 DIAGNOSIS — E1142 Type 2 diabetes mellitus with diabetic polyneuropathy: Secondary | ICD-10-CM | POA: Diagnosis not present

## 2019-11-10 DIAGNOSIS — E559 Vitamin D deficiency, unspecified: Secondary | ICD-10-CM

## 2019-11-10 DIAGNOSIS — Z9889 Other specified postprocedural states: Secondary | ICD-10-CM | POA: Diagnosis not present

## 2019-11-10 DIAGNOSIS — Z794 Long term (current) use of insulin: Secondary | ICD-10-CM | POA: Diagnosis not present

## 2019-11-10 DIAGNOSIS — J449 Chronic obstructive pulmonary disease, unspecified: Secondary | ICD-10-CM | POA: Diagnosis not present

## 2019-11-10 DIAGNOSIS — E785 Hyperlipidemia, unspecified: Secondary | ICD-10-CM

## 2019-11-10 MED ORDER — VITAMIN D (ERGOCALCIFEROL) 1.25 MG (50000 UNIT) PO CAPS: 50000.0000 [IU] | ORAL_CAPSULE | ORAL | 1 refills | Status: DC

## 2019-11-10 MED ORDER — LISINOPRIL-HYDROCHLOROTHIAZIDE 20-12.5 MG PO TABS: 1.0000 | ORAL_TABLET | Freq: Every day | ORAL | 1 refills | Status: DC

## 2019-11-10 MED ORDER — INSULIN DETEMIR 100 UNIT/ML ~~LOC~~ SOLN: 4.0000 [IU] | Freq: Every day | SUBCUTANEOUS | 2 refills | Status: DC

## 2019-11-10 MED ORDER — PRAVASTATIN SODIUM 20 MG PO TABS: 20.0000 mg | ORAL_TABLET | Freq: Every day | ORAL | 1 refills | Status: DC

## 2019-11-10 MED ORDER — TRELEGY ELLIPTA 100-62.5-25 MCG/INH IN AEPB: 1.0000 | INHALATION_SPRAY | Freq: Every day | RESPIRATORY_TRACT | 5 refills | Status: DC

## 2019-11-10 NOTE — Patient Instructions (Signed)
COVID-19 Vaccine Information can be found at: [email or web address] For questions related to vaccine distribution or appointments, please email [email or web address] or call [phone]    

## 2019-11-10 NOTE — Progress Notes (Signed)
Name: Mark Nash   MRN: [id number]    DOB: 10/29/1955   Date:11/11/2019       Progress Note  Subjective  Chief Complaint  Chief Complaint  Patient presents with  . Diabetes    refill of levemir he prefers the vial not the pen  . Hypertension  . Hyperlipidemia  . Medication Refill    Patient believes all meds are due for refill soon    I connected with  [name] on 11/11/19 at  7:40 AM EDT by telephone and verified that I am speaking with the correct person using two identifiers.  I discussed the limitations, risks, security and privacy concerns of performing an evaluation and management service by telephone and the availability of in person appointments. Staff also discussed with the patient that there may be a patient responsible charge related to this service. Patient Location: at home  Provider Location: at home    HPI  Lumbar radiculitis: he had back surgery in 06/2016 and 08/2016.He is currentlydoing okay. He was seeing Dr. Holley Raring and Dr. Meda Coffee, but had a disagreement with Dr. Holley Raring office ( front office) he is now seeing Dr. Humphrey Rolls and is taking , Tramadol, Tizanidine and higher dose gabapentin and pain is under better control , stable at 6/10 but can get worse with activity. Pain is described as sharp, aggravated by activity, and started to radiate to right groin again. He is going to have some steroid injections soon, discussed importance of monitoring glucose when he gets steroid injections  DMII: heis still on Levemir4units daily , hgbA1C was 6.8% ,6.0,6.7%,6.8%,,6.3%, 7%, 6.8%,7.1%it was down to  6.8% and last visit it was  6.6% He has episodes ofpolydipsia associatedpolyuriabut nopolyphagia. He is on ace and a statin, and has neuropathy. Pain from neuropathy is stable with  Gabapentin to tid  PAD: edema resolved, on statin therapy, cannot tolerate aspirin, he states  pain medication helps and he can walk for 15 -25  minutes , seen by  vascular surgeon in the past, he has not been wearing compression stocking hoses lately, just diabetic socks .   COPD: he is not on medication, healways hasdaily morning  productive cough.Hehas some SOB with activity and has noticed some worsening of symptoms, he would like to resume medication . Heis still smoking between 1.5 to 2 packs of cigarettes daily, he does not want to quit   Hyperlipidemia: taking Pravastatin 20 mg daily and denies myalgias. He will return for labs today   HTN: taking bp medication down to one pill daily, no chest pain or palpitation   Schizophrenia: denies any hallucinations, no depressed mood or suicidal thoughts or ideation, not on medication and refuses to see Psychiatrist.He is feeling a little tired and worn out , worried about his mother  Hepatitis C antibody positive: he has a remote history of drug use from age 65 till 71. Used injectable drug use at the time, he has positive hepatitis C , he went to see GI but did not discuss hepatitis C.He has seen GI, he has a large polyp removed l 2019 and was supposed to go back for repeat colonoscopy but he keeps postponing   Chronic Constipation: He had EGD and colonoscopy,past due for repeat testing secondary to large sigmoid polyp,he is willing to go back now - he states he will call them directly   Senile purpura: both arms.on both upper extremity. Stable   Patient Active Problem List   Diagnosis Date Noted  .  DDD (degenerative disc disease), lumbar 03/06/2019  . Primary osteoarthritis involving multiple joints 03/06/2019  . Lumbar radiculopathy 11/05/2018  . Lumbar post-laminectomy syndrome 11/05/2018  . Diabetic polyneuropathy associated with type 2 diabetes mellitus ([place]) 11/05/2018  . CLE (columnar lined esophagus)   . Gastric irritation   . Duodenum ulcer   . Personal history of colonic polyps   . Polyp of sigmoid colon   . Diverticulosis of large intestine without diverticulitis   .  Benign neoplasm of ascending colon   . Osteoarthritis of both hands 09/04/2017  . Chronic pain of left knee 09/04/2017  . Abnormal MRI, knee 09/04/2017  . 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 Lymphedema 07/07/2016  . Chronic hepatitis C without hepatic coma ([place]) 06/01/2016  . Lumbar herniated disc 11/24/2015  . Thrombocytopenia ([place]) 11/11/2015  . Hepatitis C antibody test positive 07/13/2015  . Chronic constipation 07/13/2015  . Abnormal immunological findings in specimens from other organs, systems and tissues 07/13/2015  . CN (constipation) 07/13/2015  . Benign hypertension 03/08/2015  . COPD, mild ([place]) 03/08/2015  . Dyslipidemia 03/08/2015  . Paranoid schizophrenia ([place]) 03/08/2015  . Acquired polyneuropathy 03/08/2015  . Restless leg 03/08/2015  . Tobacco abuse 03/08/2015  . Callus of foot 03/08/2015  . Claudication ([place]) 03/08/2015  . Chronic obstructive pulmonary disease ([place]) 03/08/2015  . Corn or callus 03/08/2015  . Essential (primary) hypertension 03/08/2015  . HLD (hyperlipidemia) 03/08/2015  . Peripheral vascular disease ([place]) 03/08/2015  . Polyneuropathy 03/08/2015  . Current tobacco use 03/08/2015  . Vitamin D deficiency 04/26/2009  . Avitaminosis D 04/26/2009    Past Surgical History:  Procedure Laterality Date  . BACK SURGERY    . COLONOSCOPY WITH PROPOFOL N/A 09/07/2017   Procedure: COLONOSCOPY WITH PROPOFOL;  Surgeon: Virgel Manifold, MD;  Location: [facility];  Service: Endoscopy;  Laterality: N/A;  . ESOPHAGOGASTRODUODENOSCOPY (EGD) WITH PROPOFOL N/A 12/07/2017   Procedure: ESOPHAGOGASTRODUODENOSCOPY (EGD) WITH PROPOFOL;  Surgeon: Virgel Manifold, MD;  Location: [facility];  Service: Endoscopy;  Laterality: N/A;  . FLEXIBLE SIGMOIDOSCOPY N/A 11/09/2017   Procedure: FLEXIBLE SIGMOIDOSCOPY;  Surgeon: Virgel Manifold, MD;  Location: [facility];  Service: Endoscopy;  Laterality: N/A;  . HERNIA REPAIR     inguinal  .  INGUINAL HERNIA REPAIR Bilateral 06/21/2018   Procedure: LAPAROSCOPIC BILATERAL INGUINAL HERNIA REPAIR;  Surgeon: Vickie Epley, MD;  Location: [facility];  Service: General;  Laterality: Bilateral;  . L3 TO L5 LAMINECTOMY FOR DECOMPRESSION  07/17/2016   [facility]  . REPAIR OF CEREBROSPINAL FLUID LEAK N/A 09/08/2016   Procedure: Lumbar wound exploration, repair of pseudomenigocele;  Surgeon: Kevan Ny Ditty, MD;  Location: [place];  Service: Neurosurgery;  Laterality: N/A;  Lumbar wound exploration, repair of pseudomenigocele  . SINUSOTOMY      Family History  Problem Relation Age of Onset  . Hypertension Mother   . Diverticulitis Mother   . Colitis Mother   . Diabetes Father   . Hypertension Father   . Heart disease Father   . Diverticulitis Brother   . Diabetes Sister     Social History   Tobacco Use  . Smoking status: Current Every Day Smoker    Packs/day: 1.50    Years: 46.00    Pack years: 69.00    Types: Cigarettes  . Smokeless tobacco: Never Used  Substance Use Topics  . Alcohol use: No    Alcohol/week: 0.0 standard drinks    Current Outpatient Medications:  .  blood glucose meter  kit and supplies, Dispense based on patient and insurance preference. Use up to four times daily as directed. (FOR ICD-10 E10.9, E11.9)., Disp: 1 each, Rfl: 0 .  gabapentin (NEURONTIN) 300 MG capsule, Take 1 capsule (300 mg total) by mouth 3 (three) times daily. (Patient taking differently: Take 300 mg by mouth 3 (three) times daily. Take 1 capsule AM, 1 capsule at noon, and 2 capsules (693m) at bedtime), Disp: 270 capsule, Rfl: 1 .  insulin detemir (LEVEMIR) 100 UNIT/ML injection, Inject 0.04 mLs (4 Units total) into the skin daily., Disp: 10 mL, Rfl: 2 .  lisinopril-hydrochlorothiazide (ZESTORETIC) 20-12.5 MG tablet, Take 1 tablet by mouth daily., Disp: 90 tablet, Rfl: 1 .  meloxicam (MOBIC) 7.5 MG tablet, Take 7.5 mg by mouth daily., Disp: , Rfl:  .  pravastatin  (PRAVACHOL) 20 MG tablet, Take 1 tablet (20 mg total) by mouth daily with supper., Disp: 90 tablet, Rfl: 1 .  tiZANidine (ZANAFLEX) 2 MG tablet, Take 2 mg by mouth every 12 (twelve) hours., Disp: , Rfl:  .  traMADol (ULTRAM) 50 MG tablet, Take by mouth every 12 (twelve) hours as needed., Disp: , Rfl:  .  Vitamin D, Ergocalciferol, (DRISDOL) 1.25 MG (50000 UNIT) CAPS capsule, Take 1 capsule (50,000 Units total) by mouth every 7 (seven) days., Disp: 12 capsule, Rfl: 1 .  Fluticasone-Umeclidin-Vilant (TRELEGY ELLIPTA) 100-62.5-25 MCG/INH AEPB, Inhale 1 puff into the lungs daily., Disp: 60 each, Rfl: 5  Allergies  Allergen Reactions  . Penicillins Hives and Swelling    As a child. Has patient had a PCN reaction causing immediate rash, facial/tongue/throat swelling, SOB or lightheadedness with hypotension: YES  Has patient had a PCN reaction causing severe rash involving mucus membranes or skin necrosis: UNKNOWN Has patient had a PCN reaction that required hospitalization: UNKNOWN Has patient had a PCN reaction occurring within the last 10 years: NO  . Aspirin Nausea Only  . Ibuprofen Nausea Only  . Lyrica [Pregabalin] Nausea Only  . Acetaminophen Nausea Only    I personally reviewed active problem list, medication list, allergies, family history, social history with the patient/caregiver today.   ROS  Ten systems reviewed and is negative except as mentioned in HPI   Objective  Virtual encounter, vitals obtained when he stopped by for labs  Today's Vitals   11/10/19 0735 11/10/19 0736  BP: 140/70   Pulse: 66   Resp: 16   Temp: (!) 97.5 F (36.4 C)   TempSrc: Temporal   SpO2: 97%   Weight: 157 lb 8 oz (71.4 kg)   Height: _0  (1.803 m)   PainSc:  5     Body mass index is 21.97 kg/m.  Physical Exam  Awake alert and oriented     PHQ2/9[facility] 11/10/2019 07/11/2019 03/10/2019 12/20/2018 11/18/2018  Decreased Interest 0 0 1 0 0  Down, Depressed,  Hopeless 0 0 0 0 0  PHQ - 2 Score 0 0 1 0 0  Altered sleeping 0 0 1 0 -  Tired, decreased energy 0 1 1 0 -  Change in appetite 0 0 1 0 -  Feeling bad or failure about yourself  0 0 0 0 -  Trouble concentrating 0 0 0 0 -  Moving slowly or fidgety/restless 0 0 0 0 -  Suicidal thoughts 0 0 0 0 -  PHQ-9 Score 0 1 4 0 -  Difficult doing work/chores Not difficult at all Not difficult at all Not difficult at all - -  Some recent data might be hidden   PHQ-2/9 Result is negative.    Fall Risk: Fall Risk  11/10/2019 07/29/2019 07/11/2019 03/10/2019 12/20/2018  Falls in the past year? 0 0 0 0 0  Number falls in past yr: 0 - 0 0 0  Injury with Fall? 0 - 0 0 0  Risk for fall due to : - - - - -     Assessment & Plan  1. Type 2 diabetes mellitus with diabetic polyneuropathy, with long-term current use of insulin (HCC)  - insulin detemir (LEVEMIR) 100 UNIT/ML injection; Inject 0.04 mLs (4 Units total) into the skin daily.  Dispense: 10 mL; Refill: 2 - Hemoglobin A1c  2. COPD, moderate ([place])  He will call back to switch to pulmicort and albuterol if too expensive  - Fluticasone-Umeclidin-Vilant (TRELEGY ELLIPTA) 100-62.5-25 MCG/INH AEPB; Inhale 1 puff into the lungs daily.  Dispense: 60 each; Refill: 5  3. Dyslipidemia  - pravastatin (PRAVACHOL) 20 MG tablet; Take 1 tablet (20 mg total) by mouth daily with supper.  Dispense: 90 tablet; Refill: 1 - Lipid panel  4. Benign hypertension  - lisinopril-hydrochlorothiazide (ZESTORETIC) 20-12.5 MG tablet; Take 1 tablet by mouth daily.  Dispense: 90 tablet; Refill: 1 - CBC with Differential/Platelet - COMPLETE METABOLIC PANEL WITH GFR  5. Claudication ([place])   6. Vitamin D deficiency  - Vitamin D, Ergocalciferol, (DRISDOL) 1.25 MG (50000 UNIT) CAPS capsule; Take 1 capsule (50,000 Units total) by mouth every 7 (seven) days.  Dispense: 12 capsule; Refill: 1  7. Purpura, nonthrombocytopenic (HCC)  stable  8. History of colonoscopy with  polypectomy  Advised him to go back   9. Peripheral vascular disease ([place])  Not on aspirin, cannot tolerate   I discussed the assessment and treatment plan with the patient. The patient was provided an opportunity to ask questions and all were answered. The patient agreed with the plan and demonstrated an understanding of the instructions.   The patient was advised to call back or seek an in-person evaluation if the symptoms worsen or if the condition fails to improve as anticipated.  I provided 25  minutes of non-face-to-face time during this encounter.  Loistine Chance, MD

## 2019-11-11 ENCOUNTER — Telehealth: Payer: Self-pay

## 2019-11-11 LAB — COMPLETE METABOLIC PANEL WITH GFR
AG Ratio: 2.3 (calc) (ref 1.0–2.5)
ALT: 13 U/L (ref 9–46)
AST: 16 U/L (ref 10–35)
Albumin: 4.4 g/dL (ref 3.6–5.1)
Alkaline phosphatase (APISO): 69 U/L (ref 35–144)
BUN: 15 mg/dL (ref 7–25)
CO2: 34 mmol/L — ABNORMAL HIGH (ref 20–32)
Calcium: 9.5 mg/dL (ref 8.6–10.3)
Chloride: 101 mmol/L (ref 98–110)
Creat: 0.78 mg/dL (ref 0.70–1.25)
GFR, Est African American: 111 mL/min/{1.73_m2} (ref >=60)
GFR, Est Non African American: 95 mL/min/{1.73_m2} (ref >=60)
Globulin: 1.9 g/dL (calc) (ref 1.9–3.7)
Glucose, Bld: 152 mg/dL — ABNORMAL HIGH (ref 65–99)
Potassium: 4.3 mmol/L (ref 3.5–5.3)
Sodium: 138 mmol/L (ref 135–146)
Total Bilirubin: 0.9 mg/dL (ref 0.2–1.2)
Total Protein: 6.3 g/dL (ref 6.1–8.1)

## 2019-11-11 LAB — CBC WITH DIFFERENTIAL/PLATELET
Absolute Monocytes: 714 cells/uL (ref 200–950)
Basophils Absolute: 42 cells/uL (ref 0–200)
Basophils Relative: 0.6 %
Eosinophils Absolute: 147 cells/uL (ref 15–500)
Eosinophils Relative: 2.1 %
HCT: 42.5 % (ref 38.5–50.0)
Hemoglobin: 14.6 g/dL (ref 13.2–17.1)
Lymphs Abs: 2422 cells/uL (ref 850–3900)
MCH: 31.4 pg (ref 27.0–33.0)
MCHC: 34.4 g/dL (ref 32.0–36.0)
MCV: 91.4 fL (ref 80.0–100.0)
MPV: 9.8 fL (ref 7.5–12.5)
Monocytes Relative: 10.2 %
Neutro Abs: 3675 cells/uL (ref 1500–7800)
Neutrophils Relative %: 52.5 %
Platelets: 164 10*3/uL (ref 140–400)
RBC: 4.65 10*6/uL (ref 4.20–5.80)
RDW: 12.2 % (ref 11.0–15.0)
Total Lymphocyte: 34.6 %
WBC: 7 10*3/uL (ref 3.8–10.8)

## 2019-11-11 LAB — LIPID PANEL
Cholesterol: 123 mg/dL (ref <200)
HDL: 55 mg/dL (ref >=40)
LDL Cholesterol (Calc): 51 mg/dL (calc)
Non-HDL Cholesterol (Calc): 68 mg/dL (calc) (ref <130)
Total CHOL/HDL Ratio: 2.2 (calc) (ref <5.0)
Triglycerides: 85 mg/dL (ref <150)

## 2019-11-11 LAB — HEMOGLOBIN A1C
Hgb A1c MFr Bld: 7.1 % of total Hgb — ABNORMAL HIGH (ref <5.7)
Mean Plasma Glucose: 157 (calc)
eAG (mmol/L): 8.7 (calc)

## 2019-11-11 NOTE — Telephone Encounter (Signed)
Copied from [place] [id number]. Topic: General - Other >> Nov 10, 2019  3:46 PM Greggory Keen D wrote: Reason for CRM: Pt called saying the inhaler that was sent to the pharmacy today for his COPD was too expensive and wants to know if something less expensive can be sent to the pharmacy.  Mark Nash

## 2019-11-12 NOTE — Telephone Encounter (Signed)
Form will  be in your green folder.

## 2019-11-13 ENCOUNTER — Ambulatory Visit: Payer: PPO | Attending: Internal Medicine

## 2019-11-13 DIAGNOSIS — Z23 Encounter for immunization: Secondary | ICD-10-CM

## 2019-11-13 NOTE — Progress Notes (Signed)
   Covid-19 Vaccination Clinic  Name:  Mark Nash    MRN: [id number] DOB: [birth year]  11/13/2019  [name] was observed post Covid-19 immunization for 15 minutes without incident. He was provided with Vaccine Information Sheet and instruction to access the V-Safe system.   [name] was instructed to call 911 with any severe reactions post vaccine: [name] Difficulty breathing  . Swelling of face and throat  . A fast heartbeat  . A bad rash all over body  . Dizziness and weakness   Immunizations Administered    Name Date Dose VIS Date Route   Pfizer COVID-19 Vaccine 11/13/2019 10:59 AM 0.3 mL 08/08/2019 Intramuscular   Manufacturer: [place]   Lot: [id number]   [place]: [id number]

## 2019-11-13 NOTE — Telephone Encounter (Addendum)
Pt called to follow up on status of replacement rx for inhaler that was too expensive. Stated he called on Monday and has not heard anything. Would like a callback with any type of update.

## 2019-11-13 NOTE — Telephone Encounter (Signed)
Called patient and explained to him that we are waiting on Dr. Ancil Boozer to sign form from [place] on Monday to get nebulizer machine.

## 2019-11-18 ENCOUNTER — Other Ambulatory Visit: Payer: Self-pay | Admitting: Family Medicine

## 2019-11-18 MED ORDER — IPRATROPIUM-ALBUTEROL 0.5-2.5 (3) MG/3ML IN SOLN: 3.0000 mL | Freq: Four times a day (QID) | RESPIRATORY_TRACT | 2 refills | Status: DC | PRN

## 2019-11-18 MED ORDER — BUDESONIDE 0.5 MG/2ML IN SUSP: 0.5000 mg | Freq: Two times a day (BID) | RESPIRATORY_TRACT | 2 refills | Status: DC

## 2019-11-20 ENCOUNTER — Ambulatory Visit (INDEPENDENT_AMBULATORY_CARE_PROVIDER_SITE_OTHER): Payer: PPO

## 2019-11-20 DIAGNOSIS — Z0000 Encounter for general adult medical examination without abnormal findings: Secondary | ICD-10-CM | POA: Diagnosis not present

## 2019-11-20 NOTE — Progress Notes (Signed)
Subjective:   Mark Nash is a 64 y.o. male who presents for Medicare Annual/Subsequent preventive examination.  Virtual Visit via Telephone Note  I connected with Mark Nash on 11/20/19 at  2:10 PM EDT by telephone and verified that I am speaking with the correct person using two identifiers.  Medicare Annual Wellness visit completed telephonically due to Covid-19 pandemic.   Location: Patient: home Provider: office   I discussed the limitations, risks, security and privacy concerns of performing an evaluation and management service by telephone and the availability of in person appointments. The patient expressed understanding and agreed to proceed.  Some vital signs may be absent or patient reported.   Mark Marker, LPN    Review of Systems:   Cardiac Risk Factors include: advanced age (>81mn, >>89women)     Objective:    Vitals: There were no vitals taken for this visit.  There is no height or weight on file to calculate BMI.  Advanced Directives 11/20/2019 11/18/2018 11/05/2018 06/21/2018 12/07/2017 11/30/2017 11/09/2017  Does Patient Have a Medical Advance Directive? No No No No No No No  Would patient like information on creating a medical advance directive? Yes (MAU/Ambulatory/Procedural Areas - Information given) No - Patient declined No - Patient declined No - Patient declined No - Patient declined Yes (MAU/Ambulatory/Procedural Areas - Information given) Yes (MAU/Ambulatory/Procedural Areas - Information given)    Tobacco Social History   Tobacco Use  Smoking Status Current Every Day Smoker  . Packs/day: 1.50  . Years: 46.00  . Pack years: 69.00  . Types: Cigarettes  Smokeless Tobacco Never Used     Ready to quit: Not Answered Counseling given: Not Answered   Clinical Intake:  Pre-visit preparation completed: Yes  Pain : 0-10 Pain Score: 6  Pain Type: Chronic pain Pain Location: Leg(back) Pain Orientation: Right, Left Pain Descriptors /  Indicators: Aching Pain Onset: More than a month ago Pain Frequency: Constant     Nutritional Status: BMI of 19-24  Normal Nutritional Risks: None Diabetes: Yes CBG done?: No Did pt. bring in CBG monitor from home?: No  How often do you need to have someone help you when you read instructions, pamphlets, or other written materials from your doctor or pharmacy?: 1 - Never  Interpreter Needed?: No  Information entered by :: KClemetine MarkerLPN  Past Medical History:  Diagnosis Date  . Arthritis   . Asthma   . Benign neoplasm of ascending colon    polyps  . Constipation   . COPD (chronic obstructive pulmonary disease) ([facility]    NO INHALER  . Diabetes mellitus without complication (H[facility])    Type 2  . Diverticulosis   . Dyspnea    RARE-WITH EXERTION DUE TO COPD  . GERD (gastroesophageal reflux disease)   . Hepatitis C   . History of kidney stones    H/O  . Hyperlipidemia   . Hypertension 06/10/2018   currently not under control  . Left inguinal hernia   . Peripheral vascular disease ([facility]   . Restless legs   . Schizoaffective disorder ([facility]    Pt denies  . Thrombocytopenia ([facility]   . Vitamin D deficiency    Past Surgical History:  Procedure Laterality Date  . BACK SURGERY    . COLONOSCOPY WITH PROPOFOL N/A 09/07/2017   Procedure: COLONOSCOPY WITH PROPOFOL;  Surgeon: TVirgel Manifold MD;  Location: [facility];  Service: Endoscopy;  Laterality: N/A;  . ESOPHAGOGASTRODUODENOSCOPY (EGD) WITH PROPOFOL N/A 12/07/2017  Procedure: ESOPHAGOGASTRODUODENOSCOPY (EGD) WITH PROPOFOL;  Surgeon: Virgel Manifold, MD;  Location: [facility];  Service: Endoscopy;  Laterality: N/A;  . FLEXIBLE SIGMOIDOSCOPY N/A 11/09/2017   Procedure: FLEXIBLE SIGMOIDOSCOPY;  Surgeon: Virgel Manifold, MD;  Location: [facility];  Service: Endoscopy;  Laterality: N/A;  . HERNIA REPAIR     inguinal  . INGUINAL HERNIA REPAIR Bilateral 06/21/2018   Procedure: LAPAROSCOPIC BILATERAL  INGUINAL HERNIA REPAIR;  Surgeon: Vickie Epley, MD;  Location: [facility];  Service: General;  Laterality: Bilateral;  . L3 TO L5 LAMINECTOMY FOR DECOMPRESSION  07/17/2016   [facility]  . REPAIR OF CEREBROSPINAL FLUID LEAK N/A 09/08/2016   Procedure: Lumbar wound exploration, repair of pseudomenigocele;  Surgeon: Kevan Ny Ditty, MD;  Location: [place];  Service: Neurosurgery;  Laterality: N/A;  Lumbar wound exploration, repair of pseudomenigocele  . SINUSOTOMY     Family History  Problem Relation Age of Onset  . Hypertension Mother   . Diverticulitis Mother   . Colitis Mother   . Diabetes Father   . Hypertension Father   . Heart disease Father   . Diverticulitis Brother   . Diabetes Sister    Social History   Socioeconomic History  . Marital status: Divorced    Spouse name: Not on file  . Number of children: 2  . Years of education: Not on file  . Highest education level: 12th grade  Occupational History  . Occupation: Disabled  Tobacco Use  . Smoking status: Current Every Day Smoker    Packs/day: 1.50    Years: 46.00    Pack years: 69.00    Types: Cigarettes  . Smokeless tobacco: Never Used  Substance and Sexual Activity  . Alcohol use: No    Alcohol/week: 0.0 standard drinks  . Drug use: No  . Sexual activity: Not Currently  Other Topics Concern  . Not on file  Social History Narrative   Lives alone   Social Determinants of Health   Financial Resource Strain: Low Risk   . Difficulty of Paying Living Expenses: Not hard at all  Food Insecurity: No Food Insecurity  . Worried About Charity fundraiser in the Last Year: Never true  . Ran Out of Food in the Last Year: Never true  Transportation Needs:   . Lack of Transportation (Medical):   [name] Lack of Transportation (Non-Medical):   Physical Activity: Insufficiently Active  . Days of Exercise per Week: 2 days  . Minutes of Exercise per Session: 20 min  Stress: No Stress Concern Present    . Feeling of Stress : Not at all  Social Connections: Moderately Isolated  . Frequency of Communication with Friends and Family: Three times a week  . Frequency of Social Gatherings with Friends and Family: Three times a week  . Attends Religious Services: Never  . Active Member of Clubs or Organizations: No  . Attends Archivist Meetings: Never  . Marital Status: Divorced    Outpatient Encounter Medications as of 11/20/2019  Medication Sig  . insulin detemir (LEVEMIR) 100 UNIT/ML injection Inject 0.04 mLs (4 Units total) into the skin daily.  [name] lisinopril-hydrochlorothiazide (ZESTORETIC) 20-12.5 MG tablet Take 1 tablet by mouth daily.  . meloxicam (MOBIC) 7.5 MG tablet Take 7.5 mg by mouth daily.  . pravastatin (PRAVACHOL) 20 MG tablet Take 1 tablet (20 mg total) by mouth daily with supper.  [name] tiZANidine (ZANAFLEX) 2 MG tablet Take 2 mg by mouth every 12 (twelve) hours.  [name]  traMADol (ULTRAM) 50 MG tablet Take by mouth every 12 (twelve) hours as needed.  . Vitamin D, Ergocalciferol, (DRISDOL) 1.25 MG (50000 UNIT) CAPS capsule Take 1 capsule (50,000 Units total) by mouth every 7 (seven) days.  . blood glucose meter kit and supplies Dispense based on patient and insurance preference. Use up to four times daily as directed. (FOR ICD-10 E10.9, E11.9). (Patient not taking: Reported on 11/20/2019)  . budesonide (PULMICORT) 0.5 MG/2ML nebulizer solution Take 2 mLs (0.5 mg total) by nebulization 2 (two) times daily. (Patient not taking: Reported on 11/20/2019)  . gabapentin (NEURONTIN) 300 MG capsule Take 1 capsule (300 mg total) by mouth 3 (three) times daily. (Patient taking differently: Take 300 mg by mouth 3 (three) times daily. Take 1 capsule AM, 1 capsule at noon, and 2 capsules (649m) at bedtime)  . ipratropium-albuterol (DUONEB) 0.5-2.5 (3) MG/3ML SOLN Take 3 mLs by nebulization every 6 (six) hours as needed. (Patient not taking: Reported on 11/20/2019)  . [DISCONTINUED]  Fluticasone-Umeclidin-Vilant (TRELEGY ELLIPTA) 100-62.5-25 MCG/INH AEPB Inhale 1 puff into the lungs daily.   No facility-administered encounter medications on file as of 11/20/2019.    Activities of Daily Living In your present state of health, do you have any difficulty performing the following activities: 11/20/2019 11/10/2019  Hearing? N N  Comment declines hearing aids -  Vision? N N  Difficulty concentrating or making decisions? N N  Walking or climbing stairs? N N  Dressing or bathing? N N  Doing errands, shopping? N N  Preparing Food and eating ? N -  Using the Toilet? N -  In the past six months, have you accidently leaked urine? N -  Do you have problems with loss of bowel control? N -  Managing your Medications? N -  Managing your Finances? N -  Housekeeping or managing your Housekeeping? N -  Some recent data might be hidden    Patient Care Team: SSteele Sizer MD as PCP - General (Family Medicine) Troxler, MRodman Key DPM as Consulting Physician (Podiatry) TVirgel Manifold MD as Consulting Physician (Gastroenterology) BMarlowe Sax MD as Consulting Physician (Internal Medicine) PLeim Fabry MD as Consulting Physician (Orthopedic Surgery) [facility]athi Roan [facility](Pharmacist)   Assessment:   This is a routine wellness examination for [place]  Exercise Activities and Dietary recommendations Current Exercise Habits: Home exercise routine, Type of exercise: walking, Time (Minutes): 20, Frequency (Times/Week): 2, Weekly Exercise (Minutes/Week): 40, Intensity: Mild, Exercise limited by: respiratory conditions(s)  Goals    . DIET - Reduce sweets and carbohydrate intake     Recommend to decrease portion sizes by eating 3 small healthy meals and at least 2 healthy snacks per day.    . Medicaion Optimization (pt-stated)     Current Barriers:  . Several untreated medical conditions  Pharmacist Clinical Goal(s):  .Marland KitchenOver the next 30 days, patient will  demonstrate improved understanding of prescribed medications and rationale for usage as evidenced by patient verbalization of medication care plan  Interventions: . Comprehensive medication review performed. .Marland KitchenUpdated medication list in EMR . Vitamin D deficiency: patient is not adherent to prescribed regimen, last Vitmamin D labs in 2018 indicate cholecalciferol 2000 IU daily . DM: patient well controlled and on low dose insulin, investigate switching to oral medication; needs glucometer . Updated: collaboration with Dr. SAncil Boozer [facility]  o Counseled [place] on [place] Januv[facility]), patient wants to continue insulin; strongly urged patient ot reconsider  Patient Self Care Activities:  .  Self administers medications as prescribed . Attends all scheduled provider appointments . Calls pharmacy for medication refills . Calls provider office for new concerns or questions  Please see past updates related to this goal by clicking on the "Past Updates" button in the selected goal         Fall Risk Fall Risk  11/20/2019 11/10/2019 07/29/2019 07/11/2019 03/10/2019  Falls in the past year? 0 0 0 0 0  Number falls in past yr: 0 0 - 0 0  Injury with Fall? 0 0 - 0 0  Risk for fall due to : No Fall Risks - - - -  Follow up Falls prevention discussed - - - -   FALL RISK PREVENTION PERTAINING TO THE HOME:  Any stairs in or around the home? Yes  If so, do they handrails? Yes   Home free of loose throw rugs in walkways, pet beds, electrical cords, etc? Yes  Adequate lighting in your home to reduce risk of falls? Yes   ASSISTIVE DEVICES UTILIZED TO PREVENT FALLS:  Life alert? No  Use of a cane, walker or w/c? No  Grab bars in the bathroom? No  Shower chair or bench in shower? No  Elevated toilet seat or a handicapped toilet? No   DME ORDERS:  DME order needed?  No   TIMED UP AND GO:  Was the test performed? No . Telephonic visit.   Education: Fall  risk prevention has been discussed.  Intervention(s) required? No    Depression Screen PHQ 2/9 Scores 11/20/2019 11/10/2019 07/11/2019 03/10/2019  PHQ - 2 Score 0 0 0 1  PHQ- 9 Score - 0 1 4    Cognitive Function - 6Cit deferred for 2021 AWV. Pt has no memory issues.      6CIT Screen 11/30/2017  What Year? 0 points  What month? 0 points  What time? 0 points  Count back from 20 0 points  Months in reverse 0 points  Repeat phrase 0 points  Total Score 0    Immunization History  Administered Date(s) Administered  . Influenza Inj Mdck Quad Pf 07/11/2019  . Influenza, High Dose Seasonal PF 06/30/2016  . Influenza,inj,Quad PF,6+ Mos 07/17/2017, 05/29/2018  . PFIZER SARS-COV-2 Vaccination 11/13/2019  . Pneumococcal Conjugate-13 07/13/2015  . Pneumococcal Polysaccharide-23 12/23/2009  . Tdap 12/23/2009    Qualifies for Shingles Vaccine? Yes . Due for Shingrix. Education has been provided regarding the importance of this vaccine. Pt has been advised to call insurance company to determine out of pocket expense. Advised may also receive vaccine at local pharmacy or Health Dept. Verbalized acceptance and understanding.  Tdap: Up to date  Flu Vaccine: Up to date  Pneumococcal Vaccine: Up to date    Screening Tests Health Maintenance  Topic Date Due  . OPHTHALMOLOGY EXAM  Never done  . COLONOSCOPY  09/07/2018  . HIV Screening  07/10/2020 (Originally 03/11/1971)  . FOOT EXAM  12/20/2019  . TETANUS/TDAP  12/24/2019  . HEMOGLOBIN A1C  05/12/2020  . INFLUENZA VACCINE  Completed  . Hepatitis C Screening  Completed   Cancer Screenings:  Colorectal Screening: Completed 11/09/17. Repeat every 1 years; pt aware he needs to follow up with GI and plans to call them after second covid vaccine.   Lung Cancer Screening: (Low Dose CT Chest recommended if Age 50-80 years, 30 pack-year currently smoking OR have quit w/in 15years.) does qualify. Pt declines.   Additional  Screening:  Hepatitis C Screening: does qualify; completed 11/05/18. Pt has  chronic hep C.  Vision Screening: Recommended annual ophthalmology exams for early detection of glaucoma and other disorders of the eye. Is the patient up to date with their annual eye exam?  No  Who is the provider or what is the name of the office in which the pt attends annual eye exams? Not established If pt is not established with a provider, would they like to be referred to a provider to establish care? No . Ophthalmology referral has been placed. Pt aware the office will call re: appt.  Dental Screening: Recommended annual dental exams for proper oral hygiene  Community Resource Referral:  CRR required this visit?  No       Plan:    I have personally reviewed and addressed the Medicare Annual Wellness questionnaire and have noted the following in the patient's chart:  A. Medical and social history B. Use of alcohol, tobacco or illicit drugs  C. Current medications and supplements D. Functional ability and status E.  Nutritional status F.  Physical activity G. Advance directives H. List of other physicians I.  Hospitalizations, surgeries, and ER visits in previous 12 months J.  [place] such as hearing and vision if needed, cognitive and depression L. Referrals and appointments   In addition, I have reviewed and discussed with patient certain preventive protocols, quality metrics, and best practice recommendations. A written personalized care plan for preventive services as well as general preventive health recommendations were provided to patient.   Signed,  Mark Marker, LPN Nurse Health Advisor   Nurse Notes: none

## 2019-11-20 NOTE — Patient Instructions (Signed)
[name] , Thank you for taking time to come for your Medicare Wellness Visit. I appreciate your ongoing commitment to your health goals. Please review the following plan we discussed and let me know if I can assist you in the future.   Screening recommendations/referrals: Colonoscopy: done 11/09/17. Please contact [facility] to schedule repeat screening colonoscopy.  Recommended yearly ophthalmology/optometry visit for glaucoma screening and checkup Recommended yearly dental visit for hygiene and checkup  Vaccinations: Influenza vaccine: done 07/11/19 Pneumococcal vaccine: done 07/13/15 Tdap vaccine: done 12/23/09 Shingles vaccine: Shingrix discussed. Please contact your pharmacy for coverage information.  Covid-19: 1st vaccine 11/13/19  Advanced directives: Advance directive discussed with you today. I have provided a copy for you to complete at home and have notarized. Once this is complete please bring a copy in to our office so we can scan it into your chart.  Conditions/risks identified: If you wish to quit smoking, help is available. For free tobacco cessation program offerings call the [facility] at [phone] or Live Well Line at (854)788-4243. You may also visit [email or web address] or email [email or web address] for more information on other programs.   Next appointment: Please follow up in one year for your Medicare Annual Wellness visit.    Preventive Care 65 Years and Older, Male Preventive care refers to lifestyle choices and visits with your health care provider that can promote health and wellness. What does preventive care include?  A yearly physical exam. This is also called an annual well check.  Dental exams once or twice a year.  Routine eye exams. Ask your health care provider how often you should have your eyes checked.  Personal lifestyle choices, including:  Daily care of your teeth and gums.  Regular physical activity.  Eating a  healthy diet.  Avoiding tobacco and drug use.  Limiting alcohol use.  Practicing safe sex.  Taking low doses of aspirin every day.  Taking vitamin and mineral supplements as recommended by your health care provider. What happens during an annual well check? The services and screenings done by your health care provider during your annual well check will depend on your age, overall health, lifestyle risk factors, and family history of disease. Counseling  Your health care provider may ask you questions about your:  Alcohol use.  Tobacco use.  Drug use.  Emotional well-being.  Home and relationship well-being.  Sexual activity.  Eating habits.  History of falls.  Memory and ability to understand (cognition).  Work and work Statistician. Screening  You may have the following tests or measurements:  Height, weight, and BMI.  Blood pressure.  Lipid and cholesterol levels. These may be checked every 5 years, or more frequently if you are over 65 years old.  Skin check.  Lung cancer screening. You may have this screening every year starting at age 65 if you have a 30-pack-year history of smoking and currently smoke or have quit within the past 15 years.  Fecal occult blood test (FOBT) of the stool. You may have this test every year starting at age 65.  Flexible sigmoidoscopy or colonoscopy. You may have a sigmoidoscopy every 5 years or a colonoscopy every 10 years starting at age 65.  Prostate cancer screening. Recommendations will vary depending on your family history and other risks.  Hepatitis C blood test.  Hepatitis B blood test.  Sexually transmitted disease (STD) testing.  Diabetes screening. This is done by checking your blood sugar (glucose) after you have not eaten  for a while (fasting). You may have this done every 1-3 years.  Abdominal aortic aneurysm (AAA) screening. You may need this if you are a current or former smoker.  Osteoporosis. You may be  screened starting at age 65 if you are at high risk. Talk with your health care provider about your test results, treatment options, and if necessary, the need for more tests. Vaccines  Your health care provider may recommend certain vaccines, such as:  Influenza vaccine. This is recommended every year.  Tetanus, diphtheria, and acellular pertussis (Tdap, Td) vaccine. You may need a Td booster every 10 years.  Zoster vaccine. You may need this after age 65.  Pneumococcal 13-valent conjugate (PCV13) vaccine. One dose is recommended after age 65.  Pneumococcal polysaccharide (PPSV23) vaccine. One dose is recommended after age 65. Talk to your health care provider about which screenings and vaccines you need and how often you need them. This information is not intended to replace advice given to you by your health care provider. Make sure you discuss any questions you have with your health care provider. Document Released: 09/10/2015 Document Revised: 05/03/2016 Document Reviewed: 06/15/2015 Elsevier Interactive Patient Education  2017 [employer] Prevention in the Home Falls can cause injuries. They can happen to people of all ages. There are many things you can do to make your home safe and to help prevent falls. What can I do on the outside of my home?  Regularly fix the edges of walkways and driveways and fix any cracks.  Remove anything that might make you trip as you walk through a door, such as a raised step or threshold.  Trim any bushes or trees on the path to your home.  Use bright outdoor lighting.  Clear any walking paths of anything that might make someone trip, such as rocks or tools.  Regularly check to see if handrails are loose or broken. Make sure that both sides of any steps have handrails.  Any raised decks and porches should have guardrails on the edges.  Have any leaves, snow, or ice cleared regularly.  Use sand or salt on walking paths during  winter.  Clean up any spills in your garage right away. This includes oil or grease spills. What can I do in the bathroom?  Use night lights.  Install grab bars by the toilet and in the tub and shower. Do not use towel bars as grab bars.  Use non-skid mats or decals in the tub or shower.  If you need to sit down in the shower, use a plastic, non-slip stool.  Keep the floor dry. Clean up any water that spills on the floor as soon as it happens.  Remove soap buildup in the tub or shower regularly.  Attach bath mats securely with double-sided non-slip rug tape.  Do not have throw rugs and other things on the floor that can make you trip. What can I do in the bedroom?  Use night lights.  Make sure that you have a light by your bed that is easy to reach.  Do not use any sheets or blankets that are too big for your bed. They should not hang down onto the floor.  Have a firm chair that has side arms. You can use this for support while you get dressed.  Do not have throw rugs and other things on the floor that can make you trip. What can I do in the kitchen?  Clean up any spills right  away.  Avoid walking on wet floors.  Keep items that you use a lot in easy-to-reach places.  If you need to reach something above you, use a strong step stool that has a grab bar.  Keep electrical cords out of the way.  Do not use floor polish or wax that makes floors slippery. If you must use wax, use non-skid floor wax.  Do not have throw rugs and other things on the floor that can make you trip. What can I do with my stairs?  Do not leave any items on the stairs.  Make sure that there are handrails on both sides of the stairs and use them. Fix handrails that are broken or loose. Make sure that handrails are as long as the stairways.  Check any carpeting to make sure that it is firmly attached to the stairs. Fix any carpet that is loose or worn.  Avoid having throw rugs at the top or  bottom of the stairs. If you do have throw rugs, attach them to the floor with carpet tape.  Make sure that you have a light switch at the top of the stairs and the bottom of the stairs. If you do not have them, ask someone to add them for you. What else can I do to help prevent falls?  Wear shoes that:  Do not have high heels.  Have rubber bottoms.  Are comfortable and fit you well.  Are closed at the toe. Do not wear sandals.  If you use a stepladder:  Make sure that it is fully opened. Do not climb a closed stepladder.  Make sure that both sides of the stepladder are locked into place.  Ask someone to hold it for you, if possible.  Clearly mark and make sure that you can see:  Any grab bars or handrails.  First and last steps.  Where the edge of each step is.  Use tools that help you move around (mobility aids) if they are needed. These include:  Canes.  Walkers.  Scooters.  Crutches.  Turn on the lights when you go into a dark area. Replace any light bulbs as soon as they burn out.  Set up your furniture so you have a clear path. Avoid moving your furniture around.  If any of your floors are uneven, fix them.  If there are any pets around you, be aware of where they are.  Review your medicines with your doctor. Some medicines can make you feel dizzy. This can increase your chance of falling. Ask your doctor what other things that you can do to help prevent falls. This information is not intended to replace advice given to you by your health care provider. Make sure you discuss any questions you have with your health care provider. Document Released: 06/10/2009 Document Revised: 01/20/2016 Document Reviewed: 09/18/2014 Elsevier Interactive Patient Education  2017 [employer].

## 2019-11-21 ENCOUNTER — Other Ambulatory Visit: Payer: Self-pay | Admitting: Family Medicine

## 2019-11-21 DIAGNOSIS — J449 Chronic obstructive pulmonary disease, unspecified: Secondary | ICD-10-CM | POA: Diagnosis not present

## 2019-11-21 MED ORDER — IPRATROPIUM-ALBUTEROL 0.5-2.5 (3) MG/3ML IN SOLN: 3.0000 mL | Freq: Four times a day (QID) | RESPIRATORY_TRACT | 2 refills | Status: DC | PRN

## 2019-11-21 MED ORDER — BUDESONIDE 0.5 MG/2ML IN SUSP: 0.5000 mg | Freq: Two times a day (BID) | RESPIRATORY_TRACT | 2 refills | Status: DC

## 2019-11-21 NOTE — Telephone Encounter (Signed)
Pt states that he has the actual nebulizer now, but not medication to go in it and wants to know what to do.

## 2019-11-21 NOTE — Telephone Encounter (Signed)
Form has been refaxed with printed rx to [facility] by Tiffany.

## 2019-11-24 ENCOUNTER — Telehealth: Payer: Self-pay

## 2019-11-24 ENCOUNTER — Other Ambulatory Visit: Payer: Self-pay | Admitting: Family Medicine

## 2019-11-24 DIAGNOSIS — M961 Postlaminectomy syndrome, not elsewhere classified: Secondary | ICD-10-CM | POA: Diagnosis not present

## 2019-11-24 DIAGNOSIS — M545 Low back pain: Secondary | ICD-10-CM | POA: Diagnosis not present

## 2019-11-24 DIAGNOSIS — I10 Essential (primary) hypertension: Secondary | ICD-10-CM | POA: Diagnosis not present

## 2019-11-24 DIAGNOSIS — M5416 Radiculopathy, lumbar region: Secondary | ICD-10-CM | POA: Diagnosis not present

## 2019-11-24 DIAGNOSIS — F1120 Opioid dependence, uncomplicated: Secondary | ICD-10-CM | POA: Diagnosis not present

## 2019-11-24 DIAGNOSIS — F209 Schizophrenia, unspecified: Secondary | ICD-10-CM | POA: Diagnosis not present

## 2019-11-24 DIAGNOSIS — G894 Chronic pain syndrome: Secondary | ICD-10-CM | POA: Diagnosis not present

## 2019-11-24 DIAGNOSIS — J449 Chronic obstructive pulmonary disease, unspecified: Secondary | ICD-10-CM

## 2019-11-24 DIAGNOSIS — Z79899 Other long term (current) drug therapy: Secondary | ICD-10-CM | POA: Diagnosis not present

## 2019-11-24 DIAGNOSIS — M5430 Sciatica, unspecified side: Secondary | ICD-10-CM | POA: Diagnosis not present

## 2019-11-24 MED ORDER — BUDESONIDE 0.5 MG/2ML IN SUSP: 0.5000 mg | Freq: Two times a day (BID) | RESPIRATORY_TRACT | 2 refills | Status: DC

## 2019-11-24 MED ORDER — IPRATROPIUM-ALBUTEROL 0.5-2.5 (3) MG/3ML IN SOLN: 3.0000 mL | Freq: Four times a day (QID) | RESPIRATORY_TRACT | 2 refills | Status: DC | PRN

## 2019-11-24 NOTE — Telephone Encounter (Signed)
Copied from [place] [phone]. Topic: General - Other >> Nov 24, 2019 11:46 AM Yvette Rack wrote: Reason for CRM: Pt stated he is still waiting on the Rx for budesonide (PULMICORT) 0.5 MG/2ML nebulizer solution to be sent to his pharmacy. It shows the Rx was printed.   DONE!

## 2019-11-27 NOTE — Telephone Encounter (Signed)
Patient called in again in regards to having an alternative sent in for these two medications, as patient cannot afford both medications for $74 monthly. Please advise.

## 2019-11-28 NOTE — Telephone Encounter (Signed)
I didn't understand it either. How they accept his insurance for machine, but deny it for medications.

## 2019-11-28 NOTE — Telephone Encounter (Signed)
Is there any cheaper medication that he can take. Can't afford $74 a month. Please advise.

## 2019-12-03 ENCOUNTER — Telehealth: Payer: Self-pay

## 2019-12-03 DIAGNOSIS — J449 Chronic obstructive pulmonary disease, unspecified: Secondary | ICD-10-CM

## 2019-12-03 MED ORDER — IPRATROPIUM-ALBUTEROL 0.5-2.5 (3) MG/3ML IN SOLN: 3.0000 mL | Freq: Four times a day (QID) | RESPIRATORY_TRACT | 2 refills | Status: DC | PRN

## 2019-12-03 MED ORDER — BUDESONIDE 0.5 MG/2ML IN SUSP: 0.5000 mg | Freq: Two times a day (BID) | RESPIRATORY_TRACT | 2 refills | Status: DC

## 2019-12-03 NOTE — Telephone Encounter (Signed)
Found Good Rx coupons for Generic Duoneb with 120 vials for $24.40. Would that be a 3 month Supply? However, the Budesonide is the expensive one. For 60 ampules since the directions are twice daily- It is with goodrx $65.90 but with 120 ampules it is $121.70 with this coupon. Please advise. Patient can come by and pick up printed prescriptions and coupons tomorrow.

## 2019-12-03 NOTE — Telephone Encounter (Signed)
Copied from [place] [id number]. Topic: General - Other >> Dec 03, 2019  9:43 AM Rainey Pines A wrote: Patient would like a callback from Dr. Ancil Boozer nurse I nregards to a status update on patient getting a cheaper medication for lungs. Please advise

## 2019-12-09 ENCOUNTER — Ambulatory Visit: Payer: PPO | Attending: Internal Medicine

## 2019-12-09 DIAGNOSIS — Z23 Encounter for immunization: Secondary | ICD-10-CM

## 2019-12-09 NOTE — Progress Notes (Signed)
   Covid-19 Vaccination Clinic  Name:  Mark Nash    MRN: [id number] DOB: 1956/01/06  12/09/2019  [name] was observed post Covid-19 immunization for 15 minutes without incident. He was provided with Vaccine Information Sheet and instruction to access the V-Safe system.   [name] was instructed to call 911 with any severe reactions post vaccine: [name] Difficulty breathing  . Swelling of face and throat  . A fast heartbeat  . A bad rash all over body  . Dizziness and weakness   Immunizations Administered    Name Date Dose VIS Date Route   Pfizer COVID-19 Vaccine 12/09/2019 10:37 AM 0.3 mL 08/08/2019 Intramuscular   Manufacturer: [place]   Lot: [id number]   [place]: [id number]

## 2019-12-26 ENCOUNTER — Telehealth: Payer: Self-pay | Admitting: Family Medicine

## 2019-12-26 NOTE — Chronic Care Management (AMB) (Signed)
  Care Management   Note  12/26/2019 Name: Mark Nash MRN: [id number] DOB: 1956-03-21  [name] is a 64 y.o. year old male who is a primary care patient of [name], MD and is actively engaged with the care management team. I reached out to [name] by phone today to assist with scheduling an initial visit with the Pharmacist.  Follow up plan: Telephone appointment with care management team member scheduled for: 01/06/2020  [place], [address], [place] [address] Direct Dial: [email or web address] Website: [email or web address]

## 2020-01-06 ENCOUNTER — Other Ambulatory Visit: Payer: Self-pay

## 2020-01-06 ENCOUNTER — Ambulatory Visit: Payer: PPO | Admitting: Pharmacist

## 2020-01-06 ENCOUNTER — Encounter: Payer: Self-pay | Admitting: Pharmacist

## 2020-01-06 DIAGNOSIS — J449 Chronic obstructive pulmonary disease, unspecified: Secondary | ICD-10-CM

## 2020-01-06 DIAGNOSIS — I10 Essential (primary) hypertension: Secondary | ICD-10-CM

## 2020-01-06 NOTE — Chronic Care Management (AMB) (Signed)
Chronic Care Management Pharmacy  Name: Mark Nash  MRN: [id number] DOB: [birth year]  Chief Complaint/ HPI  Mark Nash,  64 y.o. , male presents for their Initial CCM visit with the clinical pharmacist via telephone due to COVID-19 Pandemic.  PCP : [name], MD  Their chronic conditions include: DM, OA, HTN, HLD  Office Visits: 3/29 COPD, [place], d/c Duoneb, Pulmicort 3/15 COPD, [place], BP 140/70 P 66 Wt 157 BMI 21.97, pain 6/10 high dose Neurontin, A1c 6.8%, can't tolerate ASA, still smoking 2 pack/day not interested in quitting, Hep C, start Trelegy, Vit D2 11/133 DM, [place], BP 108/68 P 79, dec Zestoretic daily, chronic constipation  Consult Visit: None relevant  Medications: Outpatient Encounter Medications as of 01/06/2020  Medication Sig  . blood glucose meter kit and supplies Dispense based on patient and insurance preference. Use up to four times daily as directed. (FOR ICD-10 E10.9, E11.9).  . insulin detemir (LEVEMIR) 100 UNIT/ML injection Inject 0.04 mLs (4 Units total) into the skin daily.  [name] lisinopril-hydrochlorothiazide (ZESTORETIC) 20-12.5 MG tablet Take 1 tablet by mouth daily.  . meloxicam (MOBIC) 7.5 MG tablet Take 7.5 mg by mouth daily.  . pravastatin (PRAVACHOL) 20 MG tablet Take 1 tablet (20 mg total) by mouth daily with supper.  [name] tiZANidine (ZANAFLEX) 2 MG tablet Take 2 mg by mouth every 12 (twelve) hours.  . traMADol (ULTRAM) 50 MG tablet Take by mouth every 12 (twelve) hours as needed.  . Vitamin D, Ergocalciferol, (DRISDOL) 1.25 MG (50000 UNIT) CAPS capsule Take 1 capsule (50,000 Units total) by mouth every 7 (seven) days.  . budesonide (PULMICORT) 0.5 MG/2ML nebulizer solution Take 2 mLs (0.5 mg total) by nebulization 2 (two) times daily. (Patient not taking: Reported on 01/06/2020)  . gabapentin (NEURONTIN) 300 MG capsule Take 1 capsule (300 mg total) by mouth 3 (three) times daily. (Patient taking differently: Take 300 mg by mouth 3  (three) times daily. Take 1 capsule AM, 1 capsule at noon, and 2 capsules (645m) at bedtime)  . ipratropium-albuterol (DUONEB) 0.5-2.5 (3) MG/3ML SOLN Take 3 mLs by nebulization every 6 (six) hours as needed. (Patient not taking: Reported on 01/06/2020)   No facility-administered encounter medications on file as of 01/06/2020.     Current Diagnosis/Assessment:  Goals Addressed            This Visit's Progress   . COPD - goal adherence        CARE PLAN ENTRY (see longitudinal plan of care for additional care plan information)  Current Barriers:  . Complex patient with multiple comorbidities including diabetes, hyperlipidemia, hypertension . Self-manages medications by self. Does not use a pill box or other adherence strategies   Pharmacist Clinical Goal(s):  .Marland KitchenOver the next 90 days, patient will work with PharmD and provider towards optimized medication management . Adherence to breathing medications in 30 days . Smoking reduction over 90 days  Interventions: . Comprehensive medication review performed; medication list updated in electronic medical record . Inter-disciplinary care team collaboration (see longitudinal plan of care) . Counseled on maintenance inhaler use . Counseled on smoking reduction of 1 cig/day each week  Patient Self Care Activities:  . Patient will take medications as prescribed . Patient will focus on improved adherence by using Trelegy daily . Reduce daily cigarettes by 1 each week  Initial goal documentation     . Diabetes Mellitus - goal A1c < 7%       CARE PLAN ENTRY (see longitudinal plan  of care for additional care plan information)  Current Barriers:  . Diabetes: Type 2; complicated by chronic medical conditions including hyperlipidemia, hypertension, COPD Lab Results  Component Value Date   HGBA1C 7.1 (H) 11/10/2019 .   Lab Results  Component Value Date   CREATININE 0.78 11/10/2019   CREATININE 0.74 03/10/2019   CREATININE 0.81  06/19/2018 .   [name] No results found for: EGFR . Current antihyperglycemic regimen: Levemir 5u daily . Denies hypoglycemic symptoms, including dizziness, lightheadedness, shaking, sweating . Denies hyperglycemic symptoms, including polyuria, polydipsia, polyphagia, nocturia, blurred vision, neuropathy . Current exercise: walks twice weekly for exercise and many steps for errands .  [name] Cardiovascular risk reduction: o Current hypertensive regimen: Zestoretic daily o Current hyperlipidemia regimen: pravastatin 77m daily o Current antiplatelet regimen: aspirin intolerant  Pharmacist Clinical Goal(s):  .Marland KitchenOver the next 90 days, patient will work with PharmD and primary care provider to address Levemir dose . Check Vitamin D level and adjust regimen as needed  Interventions: . Comprehensive medication review performed, medication list updated in electronic medical record . Inter-disciplinary care team collaboration (see longitudinal plan of care) . Vitamin D level . Counseled to use increased Levemir dose when possible  Patient Self Care Activities:  . Patient will focus on medication adherence by taking Vitamin D and Levemir as directed for the next 90 days . Patient will contact provider with any episodes of hypoglycemia . Patient will report any questions or concerns to provider or clinical pharmacy  Initial goal documentation     . Hypertension goal BP < 140/90       CARE PLAN ENTRY (see longitudinal plan of care for additional care plan information)  Current Barriers:  . Controlled hypertension, complicated by diabetes, hyperlipidemia . Current antihypertensive regimen: Zestoretic  . Previous antihypertensives tried: higher dose Zestoretic . Last practice recorded BP readings:  BP Readings from Last 3 Encounters:  11/10/19 140/70  07/29/19 131/76  07/11/19 108/68 .   .Marland KitchenCurrent home BP readings:  . Most recent eGFR/CrCl: No results found for: EGFR  No components found for:  CRCL  Pharmacist Clinical Goal(s):  .Marland KitchenOver the next 90 days, patient will work with PharmD and providers to continue optimized antihypertensive regimen  Interventions: . Inter-disciplinary care team collaboration (see longitudinal plan of care) . Comprehensive medication review performed; medication list updated in the electronic medical record.  . Report orthostasis to provider or clinical pharmacist  Patient Self Care Activities:  . Report dizzines, loss of balance, blackouts when standing or bending over . Patient will focus on medication adherence by taking medication and reporting adverse effects to provider  Initial goal documentation         Tobacco Use: High Risk  . Smoking Tobacco Use: Current Every Day Smoker  . Smokeless Tobacco Use: Never Used   Other Diagnosis: pain    Patient has failed these meds in past: NA Patient is currently controlled on the following medications: gabapentin, meloxicam, tizanidine, tramadol  We discussed:  Managed by pain clinic   Gabapentin showing expired meloxicam 7.567mdaily, works, OA  Tizanidine 90m80mwice daily, reduced spasms, ache Walks twice a week Tramadol 57m47mice daily  Plan  Update medication list Continue current medications  Hypertension   Office blood pressures are  BP Readings from Last 3 Encounters:  11/10/19 140/70  07/29/19 131/76  07/11/19 108/68    Patient has failed these meds in the past: higher dose of current meds cause orthostasis  Patient checks BP  at home infrequently  Patient home BP readings are ranging: NA  We discussed  Counseled on BP goal < 140/90 and he is controlled.   Plan  Continue current medications   Hyperlipidemia   Lipid Panel     Component Value Date/Time   CHOL 123 11/10/2019 1427   CHOL 112 07/13/2015 1018   TRIG 85 11/10/2019 1427   HDL 55 11/10/2019 1427   HDL 62 07/13/2015 1018   CHOLHDL 2.2 11/10/2019 1427   VLDL 12 12/15/2016 1405   LDLCALC 51  11/10/2019 1427   LABVLDL 9 07/13/2015 1018     The ASCVD Risk score (Goff DC Jr., et al., 2013) failed to calculate for the following reasons:   The valid total cholesterol range is 130 to 320 mg/dL   Patient has failed these meds in past: None Patient is currently controlled on the following medications: pravastatin  We discussed:   Hasn't tried other statins Pravastatin likely due to liver  Plan  Consider Crestor 34m daily Continue current medications    Diabetes   Recent Relevant Labs: Lab Results  Component Value Date/Time   HGBA1C 7.1 (H) 11/10/2019 02:27 PM   HGBA1C 6.6 07/11/2019 01:45 PM   HGBA1C 6.8 03/10/2019 10:08 AM   MICROALBUR 0.3 03/10/2019 10:08 AM   MICROALBUR 50 07/17/2017 02:17 PM   MICROALBUR negative 03/11/2015 10:59 AM     Checking BG: Never  Patient has failed these meds in past: NA Patient is currently controlled on the following medications: Levemir  Last diabetic Foot exam: No results found for: HMDIABEYEEXA  Last diabetic Eye exam:  Lab Results  Component Value Date/Time   HMDIABFOOTEX Normal 02/11/2018 12:00 AM     We discussed:   Can feel vitamin D, energy Got irritation from weekly, last level 26 in 2018, get level Sometimes 5 units of Levemir, if inactive or sweet tooth  Plan Vitamin D level Patient agreed to use higher dose Levemir 5u more often Continue current medications  COPD / Asthma / Tobacco    Eosinophil count:   Lab Results  Component Value Date/Time   EOSPCT 2.1 11/10/2019 02:27 PM  %                               Eos (Absolute):  Lab Results  Component Value Date/Time   EOSABS 147 11/10/2019 02:27 PM   EOSABS 0.1 11/10/2015 10:45 AM    Tobacco Status:  Social History   Tobacco Use  Smoking Status Current Every Day Smoker  . Packs/day: 1.50  . Years: 46.00  . Pack years: 69.00  . Types: Cigarettes  Smokeless Tobacco Never Used    Patient has failed these meds in past: Duoneb,  Pulmicort Patient is currently controlled on the following medications: Trelegy Using maintenance inhaler regularly? No Frequency of rescue inhaler use:  never  We discussed:  Not using Trelegy, blamed it for Covid-19 vaccine adverse events, counseled Smokes 2 packs daily,counseled on cessation, decided on reduction  $10 copay next visit   Plan  Patient agreed to restart Trelegy Patient to reduce daily cigarettes by 1, each week Continue current medications  TMilus Height PharmD, BCGP, COrangeburg Medical [phone]

## 2020-01-06 NOTE — Patient Instructions (Signed)
Visit Information  Goals Addressed            This Visit's Progress   . COPD - goal adherence        CARE PLAN ENTRY (see longitudinal plan of care for additional care plan information)  Current Barriers:  . Complex patient with multiple comorbidities including diabetes, hyperlipidemia, hypertension . Self-manages medications by self. Does not use a pill box or other adherence strategies   Pharmacist Clinical Goal(s):  [name] Over the next 90 days, patient will work with PharmD and provider towards optimized medication management . Adherence to breathing medications in 30 days . Smoking reduction over 90 days  Interventions: . Comprehensive medication review performed; medication list updated in electronic medical record . Inter-disciplinary care team collaboration (see longitudinal plan of care) . Counseled on maintenance inhaler use . Counseled on smoking reduction of 1 cig/day each week  Patient Self Care Activities:  . Patient will take medications as prescribed . Patient will focus on improved adherence by using Trelegy daily . Reduce daily cigarettes by 1 each week  Initial goal documentation     . Diabetes Mellitus - goal A1c < 7%       CARE PLAN ENTRY (see longitudinal plan of care for additional care plan information)  Current Barriers:  . Diabetes: Type 2; complicated by chronic medical conditions including hyperlipidemia, hypertension, COPD Lab Results  Component Value Date   HGBA1C 7.1 (H) 11/10/2019 .   Lab Results  Component Value Date   CREATININE 0.78 11/10/2019   CREATININE 0.74 03/10/2019   CREATININE 0.81 06/19/2018 .   [name] No results found for: EGFR . Current antihyperglycemic regimen: Levemir 5u daily . Denies hypoglycemic symptoms, including dizziness, lightheadedness, shaking, sweating . Denies hyperglycemic symptoms, including polyuria, polydipsia, polyphagia, nocturia, blurred vision, neuropathy . Current exercise: walks twice weekly for  exercise and many steps for errands .  [name] Cardiovascular risk reduction: o Current hypertensive regimen: Zestoretic daily o Current hyperlipidemia regimen: pravastatin '20mg'$  daily o Current antiplatelet regimen: aspirin intolerant  Pharmacist Clinical Goal(s):  [name] Over the next 90 days, patient will work with PharmD and primary care provider to address Levemir dose . Check Vitamin D level and adjust regimen as needed  Interventions: . Comprehensive medication review performed, medication list updated in electronic medical record . Inter-disciplinary care team collaboration (see longitudinal plan of care) . Vitamin D level . Counseled to use increased Levemir dose when possible  Patient Self Care Activities:  . Patient will focus on medication adherence by taking Vitamin D and Levemir as directed for the next 90 days . Patient will contact provider with any episodes of hypoglycemia . Patient will report any questions or concerns to provider or clinical pharmacy  Initial goal documentation     . Hypertension goal BP < 140/90       CARE PLAN ENTRY (see longitudinal plan of care for additional care plan information)  Current Barriers:  . Controlled hypertension, complicated by diabetes, hyperlipidemia . Current antihypertensive regimen: Zestoretic  . Previous antihypertensives tried: higher dose Zestoretic . Last practice recorded BP readings:  BP Readings from Last 3 Encounters:  11/10/19 140/70  07/29/19 131/76  07/11/19 108/68 .   [name] Current home BP readings:  . Most recent eGFR/CrCl: No results found for: EGFR  No components found for: CRCL  Pharmacist Clinical Goal(s):  [name] Over the next 90 days, patient will work with PharmD and providers to continue optimized antihypertensive regimen  Interventions: . Inter-disciplinary care team  collaboration (see longitudinal plan of care) . Comprehensive medication review performed; medication list updated in the electronic medical  record.  . Report orthostasis to provider or clinical pharmacist  Patient Self Care Activities:  . Report dizzines, loss of balance, blackouts when standing or bending over . Patient will focus on medication adherence by taking medication and reporting adverse effects to provider  Initial goal documentation        Mark Nash was given information about Chronic Care Management services today including:  1. CCM service includes personalized support from designated clinical staff supervised by his physician, including individualized plan of care and coordination with other care providers 2. 24/7 contact phone numbers for assistance for urgent and routine care needs. 3. Standard insurance, coinsurance, copays and deductibles apply for chronic care management only during months in which we provide at least 20 minutes of these services. Most insurances cover these services at 100%, however patients may be responsible for any copay, coinsurance and/or deductible if applicable. This service may help you avoid the need for more expensive face-to-face services. 4. Only one practitioner may furnish and bill the service in a calendar month. 5. The patient may stop CCM services at any time (effective at the end of the month) by phone call to the office staff.  Patient agreed to services and verbal consent obtained.   Print copy of patient instructions provided.  Telephone follow up appointment with pharmacy team member scheduled for:  Milus Height, PharmD, [place], [facility] [phone] Smoking Tobacco Information, Adult Smoking tobacco can be harmful to your health. Tobacco contains a poisonous (toxic), colorless chemical called nicotine. Nicotine is addictive. It changes the brain and can make it hard to stop smoking. Tobacco also has other toxic chemicals that can hurt your body and raise your risk of many cancers. How can smoking tobacco affect me? Smoking  tobacco puts you at risk for:  Cancer. Smoking is most commonly associated with lung cancer, but can also lead to cancer in other parts of the body.  Chronic obstructive pulmonary disease (COPD). This is a long-term lung condition that makes it hard to breathe. It also gets worse over time.  High blood pressure (hypertension), heart disease, stroke, or heart attack.  Lung infections, such as pneumonia.  Cataracts. This is when the lenses in the eyes become clouded.  Digestive problems. This may include peptic ulcers, heartburn, and gastroesophageal reflux disease (GERD).  Oral health problems, such as gum disease and tooth loss.  Loss of taste and smell. Smoking can affect your appearance by causing:  Wrinkles.  Yellow or stained teeth, fingers, and fingernails. Smoking tobacco can also affect your social life, because:  It may be challenging to find places to smoke when away from home. Many workplaces, [employer], hotels, and public places are tobacco-free.  Smoking is expensive. This is due to the cost of tobacco and the long-term costs of treating health problems from smoking.  Secondhand smoke may affect those around you. Secondhand smoke can cause lung cancer, breathing problems, and heart disease. Children of smokers have a higher risk for: ? Sudden infant death syndrome (SIDS). ? Ear infections. ? Lung infections. If you currently smoke tobacco, quitting now can help you:  Lead a longer and healthier life.  Look, smell, breathe, and feel better over time.  Save money.  Protect others from the harms of secondhand smoke. What actions can I take to prevent health problems? Quit smoking   Do not start  smoking. Quit if you already do.  Make a plan to quit smoking and commit to it. Look for programs to help you and ask your health care provider for recommendations and ideas.  Set a date and write down all the reasons you want to quit.  Let your friends and family  know you are quitting so they can help and support you. Consider finding friends who also want to quit. It can be easier to quit with someone else, so that you can support each other.  Talk with your health care provider about using nicotine replacement medicines to help you quit, such as gum, lozenges, patches, sprays, or pills.  Do not replace cigarette smoking with electronic cigarettes, which are commonly called e-cigarettes. The safety of e-cigarettes is not known, and some may contain harmful chemicals.  If you try to quit but return to smoking, stay positive. It is common to slip up when you first quit, so take it one day at a time.  Be prepared for cravings. When you feel the urge to smoke, chew gum or suck on hard candy. Lifestyle  Stay busy and take care of your body.  Drink enough fluid to keep your urine pale yellow.  Get plenty of exercise and eat a healthy diet. This can help prevent weight gain after quitting.  Monitor your eating habits. Quitting smoking can cause you to have a larger appetite than when you smoke.  Find ways to relax. Go out with friends or family to a movie or a restaurant where people do not smoke.  Ask your health care provider about having regular tests (screenings) to check for cancer. This may include blood tests, imaging tests, and other tests.  Find ways to manage your stress, such as meditation, yoga, or exercise. Where to find support To get support to quit smoking, consider:  Asking your health care provider for more information and resources.  Taking classes to learn more about quitting smoking.  Looking for local organizations that offer resources about quitting smoking.  Joining a support group for people who want to quit smoking in your local community.  Calling the smokefree.gov counselor helpline: 1-800-Quit-Now 215-622-1927) Where to find more information You may find more information about quitting smoking  from:  HelpGuide.org: www.helpguide.org  https://hall.com/: smokefree.gov  American Lung Association: www.lung.org Contact a health care provider if you:  Have problems breathing.  Notice that your lips, nose, or fingers turn blue.  Have chest pain.  Are coughing up blood.  Feel faint or you pass out.  Have other health changes that cause you to worry. Summary  Smoking tobacco can negatively affect your health, the health of those around you, your finances, and your social life.  Do not start smoking. Quit if you already do. If you need help quitting, ask your health care provider.  Think about joining a support group for people who want to quit smoking in your local community. There are many effective programs that will help you to quit this behavior. This information is not intended to replace advice given to you by your health care provider. Make sure you discuss any questions you have with your health care provider. Document Revised: 05/09/2019 Document Reviewed: 08/29/2016 Elsevier Patient Education  2020 [employer].

## 2020-01-19 DIAGNOSIS — M5416 Radiculopathy, lumbar region: Secondary | ICD-10-CM | POA: Diagnosis not present

## 2020-01-19 DIAGNOSIS — M5430 Sciatica, unspecified side: Secondary | ICD-10-CM | POA: Diagnosis not present

## 2020-01-19 DIAGNOSIS — F1120 Opioid dependence, uncomplicated: Secondary | ICD-10-CM | POA: Diagnosis not present

## 2020-01-19 DIAGNOSIS — M545 Low back pain: Secondary | ICD-10-CM | POA: Diagnosis not present

## 2020-01-19 DIAGNOSIS — Z79899 Other long term (current) drug therapy: Secondary | ICD-10-CM | POA: Diagnosis not present

## 2020-01-19 DIAGNOSIS — G894 Chronic pain syndrome: Secondary | ICD-10-CM | POA: Diagnosis not present

## 2020-01-19 DIAGNOSIS — M961 Postlaminectomy syndrome, not elsewhere classified: Secondary | ICD-10-CM | POA: Diagnosis not present

## 2020-02-11 ENCOUNTER — Ambulatory Visit: Payer: Self-pay | Admitting: Pharmacist

## 2020-02-11 ENCOUNTER — Other Ambulatory Visit: Payer: Self-pay

## 2020-02-11 DIAGNOSIS — E1142 Type 2 diabetes mellitus with diabetic polyneuropathy: Secondary | ICD-10-CM

## 2020-02-11 DIAGNOSIS — J449 Chronic obstructive pulmonary disease, unspecified: Secondary | ICD-10-CM

## 2020-02-11 NOTE — Chronic Care Management (AMB) (Signed)
Chronic Care Management Pharmacy  Name: Mark Nash  MRN: [id number] DOB: 1955/09/04  Chief Complaint/ HPI  Mark Nash,  64 y.o. , male presents for their Follow-Up CCM visit with the clinical pharmacist via telephone due to COVID-19 Pandemic.  PCP : [name], MD  Their chronic conditions include: COPD, DM, HTN  Office Visits: NA  Consult Visit: NA  Medications: Outpatient Encounter Medications as of 02/11/2020  Medication Sig  . blood glucose meter kit and supplies Dispense based on patient and insurance preference. Use up to four times daily as directed. (FOR ICD-10 E10.9, E11.9).  . budesonide (PULMICORT) 0.5 MG/2ML nebulizer solution Take 2 mLs (0.5 mg total) by nebulization 2 (two) times daily. (Patient not taking: Reported on 01/06/2020)  . gabapentin (NEURONTIN) 300 MG capsule Take 1 capsule (300 mg total) by mouth 3 (three) times daily. (Patient taking differently: Take 300 mg by mouth 3 (three) times daily. Take 1 capsule AM, 1 capsule at noon, and 2 capsules (636m) at bedtime)  . insulin detemir (LEVEMIR) 100 UNIT/ML injection Inject 0.04 mLs (4 Units total) into the skin daily.  .Marland Kitchenipratropium-albuterol (DUONEB) 0.5-2.5 (3) MG/3ML SOLN Take 3 mLs by nebulization every 6 (six) hours as needed. (Patient not taking: Reported on 01/06/2020)  . lisinopril-hydrochlorothiazide (ZESTORETIC) 20-12.5 MG tablet Take 1 tablet by mouth daily.  . meloxicam (MOBIC) 7.5 MG tablet Take 7.5 mg by mouth daily.  . pravastatin (PRAVACHOL) 20 MG tablet Take 1 tablet (20 mg total) by mouth daily with supper.  .Marland KitchentiZANidine (ZANAFLEX) 2 MG tablet Take 2 mg by mouth every 12 (twelve) hours.  . traMADol (ULTRAM) 50 MG tablet Take by mouth every 12 (twelve) hours as needed.  . Vitamin D, Ergocalciferol, (DRISDOL) 1.25 MG (50000 UNIT) CAPS capsule Take 1 capsule (50,000 Units total) by mouth every 7 (seven) days.   No facility-administered encounter medications on file as of 02/11/2020.       Current Diagnosis/Assessment:  Goals Addressed   None    COPD / Asthma / Tobacco  Eosinophil count:   Lab Results  Component Value Date/Time   EOSPCT 2.1 11/10/2019 02:27 PM  %                               Eos (Absolute):  Lab Results  Component Value Date/Time   EOSABS 147 11/10/2019 02:27 PM   EOSABS 0.1 11/10/2015 10:45 AM    Tobacco Status:  Social History   Tobacco Use  Smoking Status Current Every Day Smoker  . Packs/day: 1.50  . Years: 46.00  . Pack years: 69.00  . Types: Cigarettes  Smokeless Tobacco Never Used    Patient has failed these meds in past: NA Patient is currently controlled on the following medications: None Using maintenance inhaler regularly? No Frequency of rescue inhaler use:  infrequently  We discussed:   Restarted Trelegy? Not yet, Covid inj #2 knocked him out for weeks Not using Pulmicort, Duoneb more expensive than Trelegy Improved from walking  Plan  Restart Trelegy  Tobacco Abuse   Tobacco Status:  Social History   Tobacco Use  Smoking Status Current Every Day Smoker  . Packs/day: 1.50  . Years: 46.00  . Pack years: 69.00  . Types: Cigarettes  Smokeless Tobacco Never Used   On a scale of 1-10, reports MOTIVATION to quit is 3 On a scale of 1-10, reports CONFIDENCE in quitting is 3  Patient has  failed these meds in past: NA Patient is currently uncontrolled on the following medications: None  We discussed:   Reduced cigarettes by 1 per week? Yes, about 2  Per week  Plan  Continue reduction at this rate Call PharmD or [place] Quitline when ready Continue control with diet and exercise  Medication Management   Pt uses [place] for all medications Uses pill box? Yes Pt endorses 80% compliance  We discussed:  Using Levemir 5u more often Was taxi service Less stress now  Plan  Continue current medication management strategy  Follow up: 3 month phone visit  Milus Height, PharmD, [place],  [facility] [phone]

## 2020-02-11 NOTE — Patient Instructions (Addendum)
Visit Information  Goals Addressed            This Visit's Progress   . COPD - goal adherence        CARE PLAN ENTRY (see longitudinal plan of care for additional care plan information)  Current Barriers:  . Complex patient with multiple comorbidities including diabetes, hyperlipidemia, hypertension . Self-manages medications by self. Does not use a pill box or other adherence strategies   Pharmacist Clinical Goal(s):  [name] Over the next 90 days, patient will work with PharmD and provider towards optimized medication management . Adherence to breathing medications in 30 days . Smoking reduction over 90 days  Interventions: . Comprehensive medication review performed; medication list updated in electronic medical record . Inter-disciplinary care team collaboration (see longitudinal plan of care) . Counseled on maintenance inhaler use . Counseled on smoking reduction of 1 cig/day each week . Call [phone] when ready  Patient Self Care Activities:  . Patient will take medications as prescribed . Patient will focus on improved adherence by using Trelegy daily . Reduce daily cigarettes by 1 each week  Initial goal documentation        Print copy of patient instructions provided.   Telephone follow up appointment with pharmacy team member scheduled for: 3 months  Milus Height, PharmD, [place], CTTS Clinical Pharmacist [facility] [phone] Chronic Obstructive Pulmonary Disease Chronic obstructive pulmonary disease (COPD) is a long-term (chronic) lung problem. When you have COPD, it is hard for air to get in and out of your lungs. Usually the condition gets worse over time, and your lungs will never return to normal. There are things you can do to keep yourself as healthy as possible.  Your doctor may treat your condition with: ? Medicines. ? Oxygen. ? Lung surgery.  Your doctor may also recommend: ? Rehabilitation. This includes steps to make your body  work better. It may involve a team of specialists. ? Quitting smoking, if you smoke. ? Exercise and changes to your diet. ? Comfort measures (palliative care). Follow these instructions at home: Medicines  Take over-the-counter and prescription medicines only as told by your doctor.  Talk to your doctor before taking any cough or allergy medicines. You may need to avoid medicines that cause your lungs to be dry. Lifestyle  If you smoke, stop. Smoking makes the problem worse. If you need help quitting, ask your doctor.  Avoid being around things that make your breathing worse. This may include smoke, chemicals, and fumes.  Stay active, but remember to rest as well.  Learn and use tips on how to relax.  Make sure you get enough sleep. Most adults need at least 7 hours of sleep every night.  Eat healthy foods. Eat smaller meals more often. Rest before meals. Controlled breathing Learn and use tips on how to control your breathing as told by your doctor. Try:  Breathing in (inhaling) through your nose for 1 second. Then, pucker your lips and breath out (exhale) through your lips for 2 seconds.  Putting one hand on your belly (abdomen). Breathe in slowly through your nose for 1 second. Your hand on your belly should move out. Pucker your lips and breathe out slowly through your lips. Your hand on your belly should move in as you breathe out.  Controlled coughing Learn and use controlled coughing to clear mucus from your lungs. Follow these steps: 1. Lean your head a little forward. 2. Breathe in deeply. 3. Try to hold your breath  for 3 seconds. 4. Keep your mouth slightly open while coughing 2 times. 5. Spit any mucus out into a tissue. 6. Rest and do the steps again 1 or 2 times as needed. General instructions  Make sure you get all the shots (vaccines) that your doctor recommends. Ask your doctor about a flu shot and a pneumonia shot.  Use oxygen therapy and pulmonary  rehabilitation if told by your doctor. If you need home oxygen therapy, ask your doctor if you should buy a tool to measure your oxygen level (oximeter).  Make a COPD action plan with your doctor. This helps you to know what to do if you feel worse than usual.  Manage any other conditions you have as told by your doctor.  Avoid going outside when it is very hot, cold, or humid.  Avoid people who have a sickness you can catch (contagious).  Keep all follow-up visits as told by your doctor. This is important. Contact a doctor if:  You cough up more mucus than usual.  There is a change in the color or thickness of the mucus.  It is harder to breathe than usual.  Your breathing is faster than usual.  You have trouble sleeping.  You need to use your medicines more often than usual.  You have trouble doing your normal activities such as getting dressed or walking around the house. Get help right away if:  You have shortness of breath while resting.  You have shortness of breath that stops you from: ? Being able to talk. ? Doing normal activities.  Your chest hurts for longer than 5 minutes.  Your skin color is more blue than usual.  Your pulse oximeter shows that you have low oxygen for longer than 5 minutes.  You have a fever.  You feel too tired to breathe normally. Summary  Chronic obstructive pulmonary disease (COPD) is a long-term lung problem.  The way your lungs work will never return to normal. Usually the condition gets worse over time. There are things you can do to keep yourself as healthy as possible.  Take over-the-counter and prescription medicines only as told by your doctor.  If you smoke, stop. Smoking makes the problem worse. This information is not intended to replace advice given to you by your health care provider. Make sure you discuss any questions you have with your health care provider. Document Revised: 07/27/2017 Document Reviewed:  09/18/2016 Elsevier Patient Education  2020 [employer].

## 2020-03-08 DIAGNOSIS — M5136 Other intervertebral disc degeneration, lumbar region: Secondary | ICD-10-CM | POA: Diagnosis not present

## 2020-03-08 DIAGNOSIS — Z791 Long term (current) use of non-steroidal anti-inflammatories (NSAID): Secondary | ICD-10-CM | POA: Diagnosis not present

## 2020-03-08 DIAGNOSIS — M8949 Other hypertrophic osteoarthropathy, multiple sites: Secondary | ICD-10-CM | POA: Diagnosis not present

## 2020-03-08 DIAGNOSIS — F17209 Nicotine dependence, unspecified, with unspecified nicotine-induced disorders: Secondary | ICD-10-CM | POA: Diagnosis not present

## 2020-03-15 DIAGNOSIS — F1120 Opioid dependence, uncomplicated: Secondary | ICD-10-CM | POA: Diagnosis not present

## 2020-03-15 DIAGNOSIS — Z79899 Other long term (current) drug therapy: Secondary | ICD-10-CM | POA: Diagnosis not present

## 2020-03-23 DIAGNOSIS — L851 Acquired keratosis [keratoderma] palmaris et plantaris: Secondary | ICD-10-CM | POA: Diagnosis not present

## 2020-03-23 DIAGNOSIS — E1142 Type 2 diabetes mellitus with diabetic polyneuropathy: Secondary | ICD-10-CM | POA: Diagnosis not present

## 2020-03-23 DIAGNOSIS — B351 Tinea unguium: Secondary | ICD-10-CM | POA: Diagnosis not present

## 2020-03-29 ENCOUNTER — Other Ambulatory Visit: Payer: Self-pay

## 2020-03-29 ENCOUNTER — Ambulatory Visit (INDEPENDENT_AMBULATORY_CARE_PROVIDER_SITE_OTHER): Payer: PPO | Admitting: Family Medicine

## 2020-03-29 ENCOUNTER — Encounter: Payer: Self-pay | Admitting: Family Medicine

## 2020-03-29 VITALS — BP 130/70 | HR 77 | Temp 97.5°F | Resp 16 | Ht 71.0 in | Wt 157.1 lb

## 2020-03-29 DIAGNOSIS — B182 Chronic viral hepatitis C: Secondary | ICD-10-CM

## 2020-03-29 DIAGNOSIS — J449 Chronic obstructive pulmonary disease, unspecified: Secondary | ICD-10-CM

## 2020-03-29 DIAGNOSIS — I739 Peripheral vascular disease, unspecified: Secondary | ICD-10-CM | POA: Diagnosis not present

## 2020-03-29 DIAGNOSIS — S81811A Laceration without foreign body, right lower leg, initial encounter: Secondary | ICD-10-CM

## 2020-03-29 DIAGNOSIS — D692 Other nonthrombocytopenic purpura: Secondary | ICD-10-CM

## 2020-03-29 DIAGNOSIS — Z794 Long term (current) use of insulin: Secondary | ICD-10-CM | POA: Diagnosis not present

## 2020-03-29 DIAGNOSIS — I10 Essential (primary) hypertension: Secondary | ICD-10-CM | POA: Diagnosis not present

## 2020-03-29 DIAGNOSIS — F200 Paranoid schizophrenia: Secondary | ICD-10-CM | POA: Diagnosis not present

## 2020-03-29 DIAGNOSIS — E1142 Type 2 diabetes mellitus with diabetic polyneuropathy: Secondary | ICD-10-CM | POA: Diagnosis not present

## 2020-03-29 DIAGNOSIS — Z23 Encounter for immunization: Secondary | ICD-10-CM | POA: Diagnosis not present

## 2020-03-29 DIAGNOSIS — E1159 Type 2 diabetes mellitus with other circulatory complications: Secondary | ICD-10-CM

## 2020-03-29 DIAGNOSIS — I152 Hypertension secondary to endocrine disorders: Secondary | ICD-10-CM

## 2020-03-29 DIAGNOSIS — E559 Vitamin D deficiency, unspecified: Secondary | ICD-10-CM

## 2020-03-29 DIAGNOSIS — E785 Hyperlipidemia, unspecified: Secondary | ICD-10-CM | POA: Diagnosis not present

## 2020-03-29 LAB — POCT GLYCOSYLATED HEMOGLOBIN (HGB A1C): Hemoglobin A1C: 7.1 % — AB (ref 4.0–5.6)

## 2020-03-29 MED ORDER — PRAVASTATIN SODIUM 20 MG PO TABS: 20.0000 mg | ORAL_TABLET | Freq: Every day | ORAL | 1 refills | Status: DC

## 2020-03-29 MED ORDER — LISINOPRIL-HYDROCHLOROTHIAZIDE 20-12.5 MG PO TABS: 1.0000 | ORAL_TABLET | Freq: Every day | ORAL | 1 refills | Status: DC

## 2020-03-29 NOTE — Progress Notes (Signed)
Name: Mark Nash   MRN: [id number]    DOB: 04/13/1956   Date:03/29/2020       Progress Note  Subjective  Chief Complaint  Chief Complaint  Patient presents with  . Hypertension  . Diabetes    He needs a prescription for Diabetic shoes and inserts.  [name] COPD    He never got the Pulmicort nebulizer solution, too expensive. He is using Trilegy Elipita.  . Osteoarthritis  . Dyslipidemia    HPI  Lumbar radiculitis: he had back surgery in 06/2016 and 08/2016.He is currentlydoing okay. He was seeing Dr. Roxanna Mew Dr. Meda Coffee l  but had a disagreement with Dr. Holley Raring office ( front office) he is now seeing Dr. Humphrey Rolls and is taking , Tramadol, Tizanidine and higher dose gabapentin and pain is under better control , stable at 6/10 but can get worse with activity. Pain is described as sharp, aggravated by activity, and started to radiate to right groin again.He is going to have some steroid injections soon, discussed importance of monitoring glucose when he gets steroid injections. He is now seeing Dr. Posey Pronto ( Dr. Amalia Greenhouse left )   DMII: heis still on Levemir4units daily , hgbA1C was 6.8% ,6.0,6.7%,6.8%,,6.3%, 7%, 6.8%,7.1%6.8% , 6.6% up to 7.1 % and today it is stable at 7.1 % He has episodes ofpolydipsia associatedpolyuriabut nopolyphagia. He is on ace and a statin, and has neuropathy. Pain from neuropathy is stable with  Gabapentin to three times daily . He also has PVD  PAD: edema resolved, on statin therapy now but not on aspirin because it bothers his stomach/nauseated  ,  he states  pain medication helps and he can walk for 15 -25  minutes, seen by vascular surgeon in the past, he has not been wearing compression stocking hoses lately, just diabetic socks .  COPD: he is not on medication, healways hasdaily morning  productive cough.Hehas some SOB with activity and has noticed some worsening of symptoms, he would like to resume medication . Heis still smoking between  1.5 to 2 packs of cigarettes daily. We gave him Trelegy but it was too expensive, we tried to give him medication for nebulizer therapy but he states it was higher. He states he was  back on Trelegy, but he stopped again, he states humidity is making symptoms worse and will resume medication  Hyperlipidemia: taking Pravastatin 20 mg daily and denies myalgias.Reviewed last labs   HTN: taking bp medication down to one pill daily, no chest pain, dizziness ( had some in the past he had some  )  or palpitation   Schizophrenia: denies any hallucinations, no depressed mood or suicidal thoughts or ideation, not on medication and refuses to see Psychiatrist.He is feeling a little tired and worn out , worried about his mother  Hepatitis C antibody positive: he has a remote history of drug use from age 15 till 35. Used injectable drug use at the time, he has positive hepatitis C , he went to see GI but did not discuss hepatitis C.He has seen GI, he has a large polyp removed l 2019 and was supposed to go back for repeat colonoscopy but he keeps postponing Unchanged   Chronic Constipation: he states bowel movements have improvement  He had EGD and colonoscopy in 2019 ,past due for repeat testing secondary to large sigmoid polyp,he states because of COVID-19 , he is going to wait until he finishes his dental work at [facility]   Senile purpura: both arms.on both upper extremity.,  large areas on both arms today   Laceration right lower leg: he changed a light bulb and lacerated his right lower leg during the process, shin was resting on the ladder. Discussed Tdap and he agrees, no signs of infection   Paranoid schizophrenia: he is doing well, no hallucinations   Patient Active Problem List   Diagnosis Date Noted  . DDD (degenerative disc disease), lumbar 03/06/2019  . Primary osteoarthritis involving multiple joints 03/06/2019  . Lumbar radiculopathy 11/05/2018  . Lumbar post-laminectomy syndrome  11/05/2018  . Diabetic polyneuropathy associated with type 2 diabetes mellitus ([place]) 11/05/2018  . CLE (columnar lined esophagus)   . Gastric irritation   . Duodenum ulcer   . Personal history of colonic polyps   . Polyp of sigmoid colon   . Diverticulosis of large intestine without diverticulitis   . Benign neoplasm of ascending colon   . Osteoarthritis of both hands 09/04/2017  . Chronic pain of left knee 09/04/2017  . Abnormal MRI, knee 09/04/2017  . 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 Lymphedema 07/07/2016  . Chronic hepatitis C without hepatic coma ([place]) 06/01/2016  . Lumbar herniated disc 11/24/2015  . Thrombocytopenia ([place]) 11/11/2015  . Hepatitis C antibody test positive 07/13/2015  . Chronic constipation 07/13/2015  . Abnormal immunological findings in specimens from other organs, systems and tissues 07/13/2015  . CN (constipation) 07/13/2015  . Benign hypertension 03/08/2015  . COPD, mild ([place]) 03/08/2015  . Dyslipidemia 03/08/2015  . Paranoid schizophrenia ([place]) 03/08/2015  . Acquired polyneuropathy 03/08/2015  . Restless leg 03/08/2015  . Tobacco abuse 03/08/2015  . Callus of foot 03/08/2015  . Claudication ([place]) 03/08/2015  . Chronic obstructive pulmonary disease ([place]) 03/08/2015  . Corn or callus 03/08/2015  . Essential (primary) hypertension 03/08/2015  . HLD (hyperlipidemia) 03/08/2015  . Peripheral vascular disease ([place]) 03/08/2015  . Polyneuropathy 03/08/2015  . Current tobacco use 03/08/2015  . Vitamin D deficiency 04/26/2009  . Avitaminosis D 04/26/2009    Past Surgical History:  Procedure Laterality Date  . BACK SURGERY    . COLONOSCOPY WITH PROPOFOL N/A 09/07/2017   Procedure: COLONOSCOPY WITH PROPOFOL;  Surgeon: Virgel Manifold, MD;  Location: [facility] ENDOSCOPY;  Service: Endoscopy;  Laterality: N/A;  . ESOPHAGOGASTRODUODENOSCOPY (EGD) WITH PROPOFOL N/A 12/07/2017   Procedure: ESOPHAGOGASTRODUODENOSCOPY (EGD) WITH PROPOFOL;  Surgeon:  Virgel Manifold, MD;  Location: [facility] ENDOSCOPY;  Service: Endoscopy;  Laterality: N/A;  . FLEXIBLE SIGMOIDOSCOPY N/A 11/09/2017   Procedure: FLEXIBLE SIGMOIDOSCOPY;  Surgeon: Virgel Manifold, MD;  Location: [facility] ENDOSCOPY;  Service: Endoscopy;  Laterality: N/A;  . HERNIA REPAIR     inguinal  . INGUINAL HERNIA REPAIR Bilateral 06/21/2018   Procedure: LAPAROSCOPIC BILATERAL INGUINAL HERNIA REPAIR;  Surgeon: Vickie Epley, MD;  Location: [facility] ORS;  Service: General;  Laterality: Bilateral;  . L3 TO L5 LAMINECTOMY FOR DECOMPRESSION  07/17/2016   [facility]  . REPAIR OF CEREBROSPINAL FLUID LEAK N/A 09/08/2016   Procedure: Lumbar wound exploration, repair of pseudomenigocele;  Surgeon: Kevan Ny Ditty, MD;  Location: [place];  Service: Neurosurgery;  Laterality: N/A;  Lumbar wound exploration, repair of pseudomenigocele  . SINUSOTOMY      Family History  Problem Relation Age of Onset  . Hypertension Mother   . Diverticulitis Mother   . Colitis Mother   . Diabetes Father   . Hypertension Father   . Heart disease Father   . Diverticulitis Brother   . Diabetes Sister     Social History   Tobacco  Use  . Smoking status: Current Every Day Smoker    Packs/day: 1.50    Years: 46.00    Pack years: 69.00    Types: Cigarettes  . Smokeless tobacco: Never Used  Substance Use Topics  . Alcohol use: No    Alcohol/week: 0.0 standard drinks     Current Outpatient Medications:  .  blood glucose meter kit and supplies, Dispense based on patient and insurance preference. Use up to four times daily as directed. (FOR ICD-10 E10.9, E11.9)., Disp: 1 each, Rfl: 0 .  budesonide (PULMICORT) 0.5 MG/2ML nebulizer solution, Take 2 mLs (0.5 mg total) by nebulization 2 (two) times daily., Disp: 120 mL, Rfl: 2 .  Fluticasone-Umeclidin-Vilant (TRELEGY ELLIPTA) 100-62.5-25 MCG/INH AEPB, Inhale into the lungs., Disp: , Rfl:  .  insulin detemir (LEVEMIR) 100 UNIT/ML injection,  Inject 0.04 mLs (4 Units total) into the skin daily., Disp: 10 mL, Rfl: 2 .  ipratropium-albuterol (DUONEB) 0.5-2.5 (3) MG/3ML SOLN, Take 3 mLs by nebulization every 6 (six) hours as needed., Disp: 360 mL, Rfl: 2 .  lisinopril-hydrochlorothiazide (ZESTORETIC) 20-12.5 MG tablet, Take 1 tablet by mouth daily., Disp: 90 tablet, Rfl: 1 .  meloxicam (MOBIC) 7.5 MG tablet, Take 7.5 mg by mouth daily., Disp: , Rfl:  .  pravastatin (PRAVACHOL) 20 MG tablet, Take 1 tablet (20 mg total) by mouth daily with supper., Disp: 90 tablet, Rfl: 1 .  tiZANidine (ZANAFLEX) 2 MG tablet, Take 2 mg by mouth every 12 (twelve) hours., Disp: , Rfl:  .  traMADol (ULTRAM) 50 MG tablet, Take by mouth every 12 (twelve) hours as needed., Disp: , Rfl:  .  Vitamin D, Ergocalciferol, (DRISDOL) 1.25 MG (50000 UNIT) CAPS capsule, Take 1 capsule (50,000 Units total) by mouth every 7 (seven) days., Disp: 12 capsule, Rfl: 1 .  gabapentin (NEURONTIN) 300 MG capsule, Take 1 capsule (300 mg total) by mouth 3 (three) times daily. (Patient taking differently: Take 300 mg by mouth 3 (three) times daily. Take 1 capsule AM, 1 capsule at noon, and 2 capsules (666m) at bedtime), Disp: 270 capsule, Rfl: 1  Allergies  Allergen Reactions  . Penicillins Hives and Swelling    As a child. Has patient had a PCN reaction causing immediate rash, facial/tongue/throat swelling, SOB or lightheadedness with hypotension: YES  Has patient had a PCN reaction causing severe rash involving mucus membranes or skin necrosis: UNKNOWN Has patient had a PCN reaction that required hospitalization: UNKNOWN Has patient had a PCN reaction occurring within the last 10 years: NO  . Aspirin Nausea Only  . Ibuprofen Nausea Only  . Lyrica [Pregabalin] Nausea Only  . Acetaminophen Nausea Only    I personally reviewed active problem list, medication list, allergies, family history, social history, health maintenance with the patient/caregiver  today.   ROS  Constitutional: Negative for fever or weight change.  Respiratory: Negative for cough and shortness of breath.   Cardiovascular: Negative for chest pain or palpitations.  Gastrointestinal: Negative for abdominal pain, no bowel changes.  Musculoskeletal: Negative for gait problem or joint swelling.  Skin: Negative for rash.  Neurological: Negative for dizziness or headache.  No other specific complaints in a complete review of systems (except as listed in HPI above).  Objective  Vitals:   03/29/20 1434  BP: 130/70  Pulse: 77  Resp: 16  Temp: (!) 97.5 F (36.4 C)  TempSrc: Temporal  SpO2: 98%  Weight: 157 lb 1.6 oz (71.3 kg)  Height: '5\' 11"'  (1.803 m)    Body  mass index is 21.91 kg/m.  Physical Exam  Constitutional: Patient appears well-developed and well-nourished.  No distress.  HEENT: head atraumatic, normocephalic, pupils equal and reactive to light, neck supple Cardiovascular: Normal rate, regular rhythm and normal heart sounds.  No murmur heard. No BLE edema. Decrease distal pulses  Pulmonary/Chest: Effort normal and breath sounds normal. No respiratory distress. Abdominal: Soft.  There is no tenderness. Skin hyperemia and hyperpigmentation of feet, laceration - superficial but over 1 cm in size on right anterior shin  Psychiatric: Patient has a normal mood and affect. behavior is normal. Judgment and thought content normal.  Recent Results (from the past 2160 hour(s))  POCT HgB A1C     Status: Abnormal   Collection Time: 03/29/20  2:48 PM  Result Value Ref Range   Hemoglobin A1C 7.1 (A) 4.0 - 5.6 %   HbA1c POC (<> result, manual entry)     HbA1c, POC (prediabetic range)     HbA1c, POC (controlled diabetic range)      Diabetic Foot Exam: Diabetic Foot Exam - Simple   Simple Foot Form Diabetic Foot exam was performed with the following findings: Yes 03/29/2020  3:00 PM  Visual Inspection See comments: Yes Sensation Testing See comments:  Yes Pulse Check See comments: Yes Comments Hyperpigmentation on both feet, callus formation, thick toenails bilaterally      PHQ2/9: Depression screen [facility] 2/9 03/29/2020 11/20/2019 11/10/2019 07/11/2019 03/10/2019  Decreased Interest 0 0 0 0 1  Down, Depressed, Hopeless 0 0 0 0 0  PHQ - 2 Score 0 0 0 0 1  Altered sleeping 0 - 0 0 1  Tired, decreased energy 0 - 0 1 1  Change in appetite 0 - 0 0 1  Feeling bad or failure about yourself  0 - 0 0 0  Trouble concentrating 0 - 0 0 0  Moving slowly or fidgety/restless 0 - 0 0 0  Suicidal thoughts 0 - 0 0 0  PHQ-9 Score 0 - 0 1 4  Difficult doing work/chores - - Not difficult at all Not difficult at all Not difficult at all  Some recent data might be hidden    phq 9 is negative   Fall Risk: Fall Risk  03/29/2020 11/20/2019 11/10/2019 07/29/2019 07/11/2019  Falls in the past year? 0 0 0 0 0  Number falls in past yr: 0 0 0 - 0  Injury with Fall? 0 0 0 - 0  Risk for fall due to : - No Fall Risks - - -  Follow up - Falls prevention discussed - - -     Assessment & Plan  1. Type 2 diabetes mellitus with diabetic polyneuropathy, with long-term current use of insulin (HCC)  - POCT HgB A1C  2. COPD, moderate ([place])  Back on Trelegy   3. Vitamin D deficiency  Continue supplementation   4. Peripheral vascular disease ([place])   5. Benign hypertension  - lisinopril-hydrochlorothiazide (ZESTORETIC) 20-12.5 MG tablet; Take 1 tablet by mouth daily.  Dispense: 90 tablet; Refill: 1  6. Dyslipidemia  - pravastatin (PRAVACHOL) 20 MG tablet; Take 1 tablet (20 mg total) by mouth daily with supper.  Dispense: 90 tablet; Refill: 1  7. Claudication ([place])  Stable   8. Paranoid schizophrenia ([place])  Doing well  9. Purpura, nonthrombocytopenic (HCC)  Both arms  10. Laceration of right lower leg, initial encounter  Tdap   11. Need for Tdap vaccination  - Tdap vaccine greater than or equal to 7yo IM  12. Need for pneumococcal  vaccination  - Pneumococcal polysaccharide vaccine 23-valent greater than or equal to 2yo subcutaneous/IM  13. Hypertension associated with type 2 diabetes mellitus ([place])  At goal   14. Chronic hepatitis C without hepatic coma (HCC)  Refused therapy

## 2020-04-07 DIAGNOSIS — M5430 Sciatica, unspecified side: Secondary | ICD-10-CM | POA: Diagnosis not present

## 2020-04-07 DIAGNOSIS — Z79899 Other long term (current) drug therapy: Secondary | ICD-10-CM | POA: Diagnosis not present

## 2020-04-07 DIAGNOSIS — F209 Schizophrenia, unspecified: Secondary | ICD-10-CM | POA: Diagnosis not present

## 2020-04-07 DIAGNOSIS — I10 Essential (primary) hypertension: Secondary | ICD-10-CM | POA: Diagnosis not present

## 2020-04-07 DIAGNOSIS — F1120 Opioid dependence, uncomplicated: Secondary | ICD-10-CM | POA: Diagnosis not present

## 2020-04-07 DIAGNOSIS — M545 Low back pain: Secondary | ICD-10-CM | POA: Diagnosis not present

## 2020-04-07 DIAGNOSIS — G8929 Other chronic pain: Secondary | ICD-10-CM | POA: Diagnosis not present

## 2020-04-07 DIAGNOSIS — M5416 Radiculopathy, lumbar region: Secondary | ICD-10-CM | POA: Diagnosis not present

## 2020-04-07 DIAGNOSIS — G894 Chronic pain syndrome: Secondary | ICD-10-CM | POA: Diagnosis not present

## 2020-04-07 DIAGNOSIS — M961 Postlaminectomy syndrome, not elsewhere classified: Secondary | ICD-10-CM | POA: Diagnosis not present

## 2020-05-13 ENCOUNTER — Other Ambulatory Visit: Payer: Self-pay | Admitting: Family Medicine

## 2020-05-13 DIAGNOSIS — I10 Essential (primary) hypertension: Secondary | ICD-10-CM

## 2020-05-19 ENCOUNTER — Ambulatory Visit: Payer: Self-pay | Admitting: Pharmacist

## 2020-05-19 ENCOUNTER — Other Ambulatory Visit: Payer: Self-pay

## 2020-05-19 DIAGNOSIS — J449 Chronic obstructive pulmonary disease, unspecified: Secondary | ICD-10-CM

## 2020-05-19 DIAGNOSIS — E1142 Type 2 diabetes mellitus with diabetic polyneuropathy: Secondary | ICD-10-CM

## 2020-05-19 NOTE — Chronic Care Management (AMB) (Signed)
Chronic Care Management Pharmacy  Name: Mark Nash  MRN: [id number] DOB: [birth year]  Chief Complaint/ HPI  Mark Nash,  64 y.o. , male presents for their Follow-Up CCM visit with the clinical pharmacist via telephone due to COVID-19 Pandemic.  PCP : [name], MD  Their chronic conditions include: DM, HTN, Tobacco abuse  Office Visits:NA  Consult Visit:NA  Medications: Outpatient Encounter Medications as of 05/19/2020  Medication Sig  . blood glucose meter kit and supplies Dispense based on patient and insurance preference. Use up to four times daily as directed. (FOR ICD-10 E10.9, E11.9).  [name] Fluticasone-Umeclidin-Vilant (TRELEGY ELLIPTA) 100-62.5-25 MCG/INH AEPB Inhale into the lungs.  . gabapentin (NEURONTIN) 300 MG capsule Take 1 capsule (300 mg total) by mouth 3 (three) times daily. (Patient taking differently: Take 300 mg by mouth 3 (three) times daily. Take 1 capsule AM, 1 capsule at noon, and 2 capsules (631m) at bedtime)  . insulin detemir (LEVEMIR) 100 UNIT/ML injection Inject 0.04 mLs (4 Units total) into the skin daily.  .Marland Kitchenipratropium-albuterol (DUONEB) 0.5-2.5 (3) MG/3ML SOLN Take 3 mLs by nebulization every 6 (six) hours as needed.  .Marland Kitchenlisinopril-hydrochlorothiazide (ZESTORETIC) 20-12.5 MG tablet Take 1 tablet by mouth once daily  . meloxicam (MOBIC) 7.5 MG tablet Take 7.5 mg by mouth daily.  . pravastatin (PRAVACHOL) 20 MG tablet Take 1 tablet (20 mg total) by mouth daily with supper.  .Marland KitchentiZANidine (ZANAFLEX) 2 MG tablet Take 2 mg by mouth every 12 (twelve) hours.  . traMADol (ULTRAM) 50 MG tablet Take by mouth every 12 (twelve) hours as needed.  . Vitamin D, Ergocalciferol, (DRISDOL) 1.25 MG (50000 UNIT) CAPS capsule Take 1 capsule (50,000 Units total) by mouth every 7 (seven) days.   No facility-administered encounter medications on file as of 05/19/2020.     Financial Resource Strain: Low Risk   . Difficulty of Paying Living Expenses: Not hard at  all     Current Diagnosis/Assessment:  Goals Addressed            This Visit's Progress   . COPD - goal adherence        CARE PLAN ENTRY (see longitudinal plan of care for additional care plan information)  Current Barriers:  . Complex patient with multiple comorbidities including diabetes, hyperlipidemia, hypertension . Self-manages medications by self. Does not use a pill box or other adherence strategies   Pharmacist Clinical Goal(s):  .Marland KitchenOver the next 90 days, patient will work with PharmD and provider towards optimized medication management . Adherence to breathing medications in 30 days - achieved . Smoking reduction over 90 days - not started  Interventions: . Comprehensive medication review performed; medication list updated in electronic medical record . Inter-disciplinary care team collaboration (see longitudinal plan of care) . Counseled on maintenance inhaler use . Call [phone] even if not ready  Patient Self Care Activities:  . Patient will take medications as prescribed . Patient will focus on improved adherence by using Trelegy daily . Reduce daily cigarettes by 1 each week . Call [place] Quitline  Initial goal documentation       Tobacco Abuse   Tobacco Status:  Social History   Tobacco Use  Smoking Status Current Every Day Smoker  . Packs/day: 1.50  . Years: 46.00  . Pack years: 69.00  . Types: Cigarettes  Smokeless Tobacco Never Used    Patient triggers include: going to a social event On a scale of 1-10, reports MOTIVATION to quit is 5 On  a scale of 1-10, reports CONFIDENCE in quitting is 6  Previous quit attempts included: NA Patient is currently uncontrolled on the following medications:  . 40 cigarettes daily  We discussed:   Restarted Trelegy? Yes, 04/28/20 restart, $30/month [place] and Trelegy cost Reduced cigarettes? No, 2 packs daily, much stress Called QuitLine?  No, counseled  Plan  Call [email or web address] even if not ready  to quit Continue current medications  Diabetes   Recent Relevant Labs: Lab Results  Component Value Date/Time   HGBA1C 7.1 (A) 03/29/2020 02:48 PM   HGBA1C 7.1 (H) 11/10/2019 02:27 PM   HGBA1C 6.6 07/11/2019 01:45 PM   HGBA1C 6.8 03/10/2019 10:08 AM   MICROALBUR 0.3 03/10/2019 10:08 AM   MICROALBUR 50 07/17/2017 02:17 PM   MICROALBUR negative 03/11/2015 10:59 AM     Patient has failed these meds in past: NA Patient is currently uncontrolled on the following medications: Levemir  Last diabetic Foot exam: No results found for: HMDIABEYEEXA  Last diabetic Eye exam:  Lab Results  Component Value Date/Time   HMDIABFOOTEX Normal 02/11/2018 12:00 AM     We discussed: Levemir usage consistent?  4 - 5 units  Plan  Continue current medications   Medication Management   We discussed:  Life much more stressful recently. Death in family.  Plan  Continue current medication management strategy  Follow up: 3 month phone visit  Milus Height, PharmD, [place], [facility] [phone]

## 2020-05-20 DIAGNOSIS — Z79891 Long term (current) use of opiate analgesic: Secondary | ICD-10-CM | POA: Diagnosis not present

## 2020-05-20 DIAGNOSIS — F1120 Opioid dependence, uncomplicated: Secondary | ICD-10-CM | POA: Diagnosis not present

## 2020-05-23 NOTE — Patient Instructions (Addendum)
Visit Information  Goals Addressed            This Visit's Progress   . COPD - goal adherence        CARE PLAN ENTRY (see longitudinal plan of care for additional care plan information)  Current Barriers:  . Complex patient with multiple comorbidities including diabetes, hyperlipidemia, hypertension . Self-manages medications by self. Does not use a pill box or other adherence strategies   Pharmacist Clinical Goal(s):  [name] Over the next 90 days, patient will work with PharmD and provider towards optimized medication management . Adherence to breathing medications in 30 days - achieved . Smoking reduction over 90 days - not started  Interventions: . Comprehensive medication review performed; medication list updated in electronic medical record . Inter-disciplinary care team collaboration (see longitudinal plan of care) . Counseled on maintenance inhaler use . Call [phone] even if not ready  Patient Self Care Activities:  . Patient will take medications as prescribed . Patient will focus on improved adherence by using Trelegy daily . Reduce daily cigarettes by 1 each week . Call [place] Quitline  Initial goal documentation        Print copy of patient instructions provided.   Telephone follow up appointment with pharmacy team member scheduled for: 3 months  Milus Height, PharmD, [place], [facility] [phone] Steps to Quit Smoking Smoking tobacco is the leading cause of preventable death. It can affect almost every organ in the body. Smoking puts you and those around you at risk for developing many serious chronic diseases. Quitting smoking can be difficult, but it is one of the best things that you can do for your health. It is never too late to quit. How do I get ready to quit? When you decide to quit smoking, create a plan to help you succeed. Before you quit:  Pick a date to quit. Set a date within the next 2 weeks to give you  time to prepare.  Write down the reasons why you are quitting. Keep this list in places where you will see it often.  Tell your family, friends, and co-workers that you are quitting. Support from your loved ones can make quitting easier.  Talk with your health care provider about your options for quitting smoking.  Find out what treatment options are covered by your health insurance.  Identify people, places, things, and activities that make you want to smoke (triggers). Avoid them. What first steps can I take to quit smoking?  Throw away all cigarettes at home, at work, and in your car.  Throw away smoking accessories, such as Scientist, research (medical).  Clean your car. Make sure to empty the ashtray.  Clean your home, including curtains and carpets. What strategies can I use to quit smoking? Talk with your health care provider about combining strategies, such as taking medicines while you are also receiving in-person counseling. Using these two strategies together makes you more likely to succeed in quitting than if you used either strategy on its own.  If you are pregnant or breastfeeding, talk with your health care provider about finding counseling or other support strategies to quit smoking. Do not take medicine to help you quit smoking unless your health care provider tells you to do so. To quit smoking: Quit right away  Quit smoking completely, instead of gradually reducing how much you smoke over a period of time. Research shows that stopping smoking right away is more successful than gradually quitting.  Attend in-person counseling to help you build problem-solving skills. You are more likely to succeed in quitting if you attend counseling sessions regularly. Even short sessions of 10 minutes can be effective. Take medicine You may take medicines to help you quit smoking. Some medicines require a prescription and some you can purchase over-the-counter. Medicines may have nicotine  in them to replace the nicotine in cigarettes. Medicines may:  Help to stop cravings.  Help to relieve withdrawal symptoms. Your health care provider may recommend:  Nicotine patches, gum, or lozenges.  Nicotine inhalers or sprays.  Non-nicotine medicine that is taken by mouth. Find resources Find resources and support systems that can help you to quit smoking and remain smoke-free after you quit. These resources are most helpful when you use them often. They include:  Online chats with a Social worker.  Telephone quitlines.  Printed Furniture conservator/restorer.  Support groups or group counseling.  Text messaging programs.  Mobile phone apps or applications. Use apps that can help you stick to your quit plan by providing reminders, tips, and encouragement. There are many free apps for mobile devices as well as websites. Examples include Quit Guide from the [employer] and smokefree.gov What things can I do to make it easier to quit?   Reach out to your family and friends for support and encouragement. Call telephone quitlines (1-800-QUIT-NOW), reach out to support groups, or work with a counselor for support.  Ask people who smoke to avoid smoking around you.  Avoid places that trigger you to smoke, such as bars, parties, or smoke-break areas at work.  Spend time with people who do not smoke.  Lessen the stress in your life. Stress can be a smoking trigger for some people. To lessen stress, try: ? Exercising regularly. ? Doing deep-breathing exercises. ? Doing yoga. ? Meditating. ? Performing a body scan. This involves closing your eyes, scanning your body from head to toe, and noticing which parts of your body are particularly tense. Try to relax the muscles in those areas. How will I feel when I quit smoking? Day 1 to 3 weeks Within the first 24 hours of quitting smoking, you may start to feel withdrawal symptoms. These symptoms are usually most noticeable 2-3 days after quitting, but  they usually do not last for more than 2-3 weeks. You may experience these symptoms:  Mood swings.  Restlessness, anxiety, or irritability.  Trouble concentrating.  Dizziness.  Strong cravings for sugary foods and nicotine.  Mild weight gain.  Constipation.  Nausea.  Coughing or a sore throat.  Changes in how the medicines that you take for unrelated issues work in your body.  Depression.  Trouble sleeping (insomnia). Week 3 and afterward After the first 2-3 weeks of quitting, you may start to notice more positive results, such as:  Improved sense of smell and taste.  Decreased coughing and sore throat.  Slower heart rate.  Lower blood pressure.  Clearer skin.  The ability to breathe more easily.  Fewer sick days. Quitting smoking can be very challenging. Do not get discouraged if you are not successful the first time. Some people need to make many attempts to quit before they achieve long-term success. Do your best to stick to your quit plan, and talk with your health care provider if you have any questions or concerns. Summary  Smoking tobacco is the leading cause of preventable death. Quitting smoking is one of the best things that you can do for your health.  When you decide  to quit smoking, create a plan to help you succeed.  Quit smoking right away, not slowly over a period of time.  When you start quitting, seek help from your health care provider, family, or friends. This information is not intended to replace advice given to you by your health care provider. Make sure you discuss any questions you have with your health care provider. Document Revised: 05/09/2019 Document Reviewed: 11/02/2018 Elsevier Patient Education  [place].

## 2020-05-31 DIAGNOSIS — G894 Chronic pain syndrome: Secondary | ICD-10-CM | POA: Diagnosis not present

## 2020-05-31 DIAGNOSIS — F209 Schizophrenia, unspecified: Secondary | ICD-10-CM | POA: Diagnosis not present

## 2020-06-02 DIAGNOSIS — M272 Inflammatory conditions of jaws: Secondary | ICD-10-CM | POA: Diagnosis not present

## 2020-06-28 DIAGNOSIS — E1140 Type 2 diabetes mellitus with diabetic neuropathy, unspecified: Secondary | ICD-10-CM | POA: Diagnosis not present

## 2020-07-12 ENCOUNTER — Telehealth: Payer: Self-pay

## 2020-07-12 NOTE — Progress Notes (Signed)
07/12/2020-LMTRC 07/13/2020 no answer 07/16/2020 lmtrc  Three Calls placed to pt. No return call. Call deferred for one month. Pharm D aware.

## 2020-07-26 DIAGNOSIS — F209 Schizophrenia, unspecified: Secondary | ICD-10-CM | POA: Diagnosis not present

## 2020-07-26 DIAGNOSIS — M5430 Sciatica, unspecified side: Secondary | ICD-10-CM | POA: Diagnosis not present

## 2020-07-26 DIAGNOSIS — G894 Chronic pain syndrome: Secondary | ICD-10-CM | POA: Diagnosis not present

## 2020-07-26 DIAGNOSIS — M961 Postlaminectomy syndrome, not elsewhere classified: Secondary | ICD-10-CM | POA: Diagnosis not present

## 2020-07-26 DIAGNOSIS — M545 Low back pain, unspecified: Secondary | ICD-10-CM | POA: Diagnosis not present

## 2020-07-26 DIAGNOSIS — M5416 Radiculopathy, lumbar region: Secondary | ICD-10-CM | POA: Diagnosis not present

## 2020-07-30 ENCOUNTER — Telehealth: Payer: Self-pay

## 2020-07-30 NOTE — Progress Notes (Signed)
Left message for patient [phone]  My call back number is [phone].  Person answering at above number will not identify and states Mark Nash is not available.

## 2020-08-02 ENCOUNTER — Ambulatory Visit: Payer: PPO | Admitting: Family Medicine

## 2020-08-09 ENCOUNTER — Telehealth: Payer: Self-pay | Admitting: *Deleted

## 2020-08-09 NOTE — Chronic Care Management (AMB) (Signed)
  Care Management   Note  08/09/2020 Name: [name] MRN: [id number] DOB: Jan 03, 1956  Mark Nash is a 64 y.o. year old male who is a primary care patient of [name], MD and is actively engaged with the care management team. I reached out to [name] by phone today to assist with re-scheduling a follow up visit with the Pharmacist  Follow up plan: Unsuccessful telephone outreach attempt made. A HIPAA compliant phone message was left for the patient providing contact information and requesting a return call. The care management team will reach out to the patient again over the next 7 days. If patient returns call to provider office, please advise to call [place] at [phone].  [place] Management

## 2020-08-10 DIAGNOSIS — Z79891 Long term (current) use of opiate analgesic: Secondary | ICD-10-CM | POA: Diagnosis not present

## 2020-08-10 DIAGNOSIS — F1120 Opioid dependence, uncomplicated: Secondary | ICD-10-CM | POA: Diagnosis not present

## 2020-08-16 NOTE — Chronic Care Management (AMB) (Signed)
  Care Management   Note  08/16/2020 Name: Mark Nash MRN: [id number] DOB: 03-Oct-1955  Mark Nash is a 64 y.o. year old male who is a primary care patient of [name], MD and is actively engaged with the care management team. I reached out to [name] by phone today to assist with re-scheduling a follow up visit with the Pharmacist  Follow up plan: Unsuccessful telephone outreach attempt made. A HIPAA compliant phone message was left for the patient providing contact information and requesting a return call.  The care management team will reach out to the patient again over the next 7 days.  If patient returns call to provider office, please advise to call [place] at [phone].  [place] Management  Direct Dial [phone]

## 2020-08-17 DIAGNOSIS — Z79891 Long term (current) use of opiate analgesic: Secondary | ICD-10-CM | POA: Diagnosis not present

## 2020-08-17 DIAGNOSIS — F1120 Opioid dependence, uncomplicated: Secondary | ICD-10-CM | POA: Diagnosis not present

## 2020-08-23 DIAGNOSIS — M545 Low back pain, unspecified: Secondary | ICD-10-CM | POA: Diagnosis not present

## 2020-08-23 DIAGNOSIS — M5430 Sciatica, unspecified side: Secondary | ICD-10-CM | POA: Diagnosis not present

## 2020-08-23 DIAGNOSIS — G894 Chronic pain syndrome: Secondary | ICD-10-CM | POA: Diagnosis not present

## 2020-08-23 DIAGNOSIS — M961 Postlaminectomy syndrome, not elsewhere classified: Secondary | ICD-10-CM | POA: Diagnosis not present

## 2020-08-23 DIAGNOSIS — F209 Schizophrenia, unspecified: Secondary | ICD-10-CM | POA: Diagnosis not present

## 2020-08-23 DIAGNOSIS — M5416 Radiculopathy, lumbar region: Secondary | ICD-10-CM | POA: Diagnosis not present

## 2020-08-23 NOTE — Chronic Care Management (AMB) (Signed)
  Care Management   Note  08/23/2020 Name: [name] MRN: [id number] DOB: 16-Dec-1955  Mark Nash is a 64 y.o. year old male who is a primary care patient of [name], MD and is actively engaged with the care management team. I reached out to [name] by phone today to assist with re-scheduling a follow up visit with the Pharmacist.  Follow up plan: Patient declines further follow up and engagement by the care management team. Appropriate care team members and provider have been notified via electronic communication. The care management team is available to follow up with the patient after provider conversation with the patient regarding recommendation for care management engagement and subsequent re-referral to the care management team.   [place] Management

## 2020-09-01 DIAGNOSIS — B351 Tinea unguium: Secondary | ICD-10-CM | POA: Diagnosis not present

## 2020-09-01 DIAGNOSIS — L851 Acquired keratosis [keratoderma] palmaris et plantaris: Secondary | ICD-10-CM | POA: Diagnosis not present

## 2020-09-01 DIAGNOSIS — E1142 Type 2 diabetes mellitus with diabetic polyneuropathy: Secondary | ICD-10-CM | POA: Diagnosis not present

## 2020-09-07 ENCOUNTER — Telehealth: Payer: Self-pay

## 2020-09-15 ENCOUNTER — Ambulatory Visit (INDEPENDENT_AMBULATORY_CARE_PROVIDER_SITE_OTHER): Payer: PPO | Admitting: Family Medicine

## 2020-09-15 ENCOUNTER — Encounter: Payer: Self-pay | Admitting: Family Medicine

## 2020-09-15 ENCOUNTER — Other Ambulatory Visit: Payer: Self-pay

## 2020-09-15 VITALS — BP 126/70 | HR 76 | Temp 98.0°F | Resp 16 | Ht 71.0 in | Wt 162.4 lb

## 2020-09-15 DIAGNOSIS — E1151 Type 2 diabetes mellitus with diabetic peripheral angiopathy without gangrene: Secondary | ICD-10-CM

## 2020-09-15 DIAGNOSIS — B182 Chronic viral hepatitis C: Secondary | ICD-10-CM

## 2020-09-15 DIAGNOSIS — E1142 Type 2 diabetes mellitus with diabetic polyneuropathy: Secondary | ICD-10-CM

## 2020-09-15 DIAGNOSIS — D692 Other nonthrombocytopenic purpura: Secondary | ICD-10-CM | POA: Diagnosis not present

## 2020-09-15 DIAGNOSIS — J449 Chronic obstructive pulmonary disease, unspecified: Secondary | ICD-10-CM

## 2020-09-15 DIAGNOSIS — E785 Hyperlipidemia, unspecified: Secondary | ICD-10-CM

## 2020-09-15 DIAGNOSIS — Z794 Long term (current) use of insulin: Secondary | ICD-10-CM

## 2020-09-15 DIAGNOSIS — F200 Paranoid schizophrenia: Secondary | ICD-10-CM | POA: Diagnosis not present

## 2020-09-15 DIAGNOSIS — E559 Vitamin D deficiency, unspecified: Secondary | ICD-10-CM | POA: Diagnosis not present

## 2020-09-15 DIAGNOSIS — I10 Essential (primary) hypertension: Secondary | ICD-10-CM

## 2020-09-15 DIAGNOSIS — I739 Peripheral vascular disease, unspecified: Secondary | ICD-10-CM

## 2020-09-15 DIAGNOSIS — E119 Type 2 diabetes mellitus without complications: Secondary | ICD-10-CM | POA: Diagnosis not present

## 2020-09-15 LAB — POCT GLYCOSYLATED HEMOGLOBIN (HGB A1C): Hemoglobin A1C: 7.5 % — AB (ref 4.0–5.6)

## 2020-09-15 MED ORDER — LISINOPRIL-HYDROCHLOROTHIAZIDE 20-12.5 MG PO TABS
1.0000 | ORAL_TABLET | Freq: Every day | ORAL | 1 refills | Status: DC
Start: 2020-09-15 — End: 2021-03-16

## 2020-09-15 MED ORDER — VITAMIN D (ERGOCALCIFEROL) 1.25 MG (50000 UNIT) PO CAPS
50000.0000 [IU] | ORAL_CAPSULE | ORAL | 1 refills | Status: DC
Start: 2020-09-15 — End: 2021-03-16

## 2020-09-15 NOTE — Progress Notes (Signed)
Name: Mark Nash   MRN: [id number]    DOB: 06-11-1956   Date:09/15/2020       Progress Note  Subjective  Chief Complaint  Follow Up  HPI  Lumbar radiculitis: he had back surgery in 06/2016 and 08/2016.He is currentlydoing okay. Currently seeing  Dr. Humphrey Rolls and is taking , Tramadol, Tizanidine and higher dose gabapentin and pain is stable at 6-8/10 but can get worse with activity. Pain is described as sharp, aggravated by activity, and started to radiate to right groin again.No recent steroid injections   DMII: heis still on Levemir4units daily , hgbA1C was 6.8% ,6.0,6.7%,6.8%,,6.3%, 7%, 6.8%,7.1%6.8% , 6.6% , 7.1 %, 7.1 % today is up to 7.5 %  He has episodes ofpolydipsia associatedpolyuriabut nopolyphagia. He is on ace and a statin, and has neuropathy. Pain from neuropathy is stable with  Gabapentin three times daily He also has PVD. He is due for an eye exam but is trying to be done with dental appointment first  He has noticed some blurred vision intermittently, he states not as compliant with diet during the holidays, he also states because he can only eat soft food due to lack of teeth and waiting for dentures, but will resume a healthier diet and monitor  PAD: edema resolved, on statin therapy now but not on aspirin because it bothers his stomach/nauseated  ,  he states  pain medication helps and he can walk for 15 -25  minutes, seen by vascular surgeon in the past, he has not been wearing compression stocking hoses lately, just diabetic socks and symptoms are also stable   COPD: he is not on medication, healways hasdaily morning  productive cough.Hehas some SOB with activity and has noticed some worsening of symptoms, he would like to resume medication . Heis still smoking between 1-1.5 packs per day. He is back on Trelegy and he states it helps symptoms   Hyperlipidemia: taking Pravastatin 20 mg daily and denies myalgias.  HTN: taking bp medication  down to one pill daily, no chest pain, or palpitation BP is at goal, no recent episodes of dizziness  Schizophrenia: denies any hallucinations, no depressed mood or suicidal thoughts or ideation, not on medication and refuses to see Psychiatrist.He states he helps his family. Taking care of mother, sister and brother   Hepatitis C antibody positive: he has a remote history of drug use from age 8 till 44. 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 DDD (degenerative disc disease), lumbar 03/06/2019  . Primary osteoarthritis involving multiple joints 03/06/2019  . Lumbar radiculopathy 11/05/2018  . Lumbar post-laminectomy syndrome 11/05/2018  . Diabetic polyneuropathy associated with type 2 diabetes mellitus ([place]) 11/05/2018  . CLE (columnar lined esophagus)   . Gastric irritation   . Duodenum ulcer   . Personal history of  colonic polyps   . Polyp of sigmoid colon   . Diverticulosis of large intestine without diverticulitis   . Benign neoplasm of ascending colon   . Osteoarthritis of both hands 09/04/2017  . Chronic pain of left knee 09/04/2017  . Abnormal MRI, knee 09/04/2017  . 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 Lymphedema 07/07/2016  . Chronic hepatitis C without hepatic coma ([place]) 06/01/2016  . Lumbar herniated disc 11/24/2015  . Thrombocytopenia ([place])  11/11/2015  . Hepatitis C antibody test positive 07/13/2015  . Chronic constipation 07/13/2015  . Abnormal immunological findings in specimens from other organs, systems and tissues 07/13/2015  . CN (constipation) 07/13/2015  . Diabetes mellitus with coincident hypertension ([place]) 03/08/2015  . COPD, mild ([place]) 03/08/2015  . Dyslipidemia 03/08/2015  . Paranoid schizophrenia ([place]) 03/08/2015  . Acquired polyneuropathy 03/08/2015  . Restless leg 03/08/2015  . Tobacco abuse 03/08/2015  . Callus of foot 03/08/2015  . Claudication ([place]) 03/08/2015  . Chronic obstructive pulmonary disease ([place]) 03/08/2015  . Corn or callus 03/08/2015  . Essential (primary) hypertension 03/08/2015  . HLD (hyperlipidemia) 03/08/2015  . Peripheral vascular disease ([place]) 03/08/2015  . Polyneuropathy 03/08/2015  . Current tobacco use 03/08/2015  . Vitamin D deficiency 04/26/2009  . Avitaminosis D 04/26/2009    Past Surgical History:  Procedure Laterality Date  . BACK SURGERY    . COLONOSCOPY WITH PROPOFOL N/A 09/07/2017   Procedure: COLONOSCOPY WITH PROPOFOL;  Surgeon: Virgel Manifold, MD;  Location: [facility] ENDOSCOPY;  Service: Endoscopy;  Laterality: N/A;  . ESOPHAGOGASTRODUODENOSCOPY (EGD) WITH PROPOFOL N/A 12/07/2017   Procedure: ESOPHAGOGASTRODUODENOSCOPY (EGD) WITH PROPOFOL;  Surgeon: Virgel Manifold, MD;  Location: [facility] ENDOSCOPY;  Service: Endoscopy;  Laterality: N/A;  . FLEXIBLE SIGMOIDOSCOPY N/A 11/09/2017   Procedure: FLEXIBLE SIGMOIDOSCOPY;  Surgeon: Virgel Manifold, MD;  Location: [facility] ENDOSCOPY;  Service: Endoscopy;  Laterality: N/A;  . HERNIA REPAIR     inguinal  . INGUINAL HERNIA REPAIR Bilateral 06/21/2018   Procedure: LAPAROSCOPIC BILATERAL INGUINAL HERNIA REPAIR;  Surgeon: Vickie Epley, MD;  Location: [facility] ORS;  Service: General;  Laterality: Bilateral;  . L3 TO L5 LAMINECTOMY FOR DECOMPRESSION  07/17/2016   [facility]  . REPAIR OF CEREBROSPINAL FLUID  LEAK N/A 09/08/2016   Procedure: Lumbar wound exploration, repair of pseudomenigocele;  Surgeon: Kevan Ny Ditty, MD;  Location: [place];  Service: Neurosurgery;  Laterality: N/A;  Lumbar wound exploration, repair of pseudomenigocele  . SINUSOTOMY      Family History  Problem Relation Age of Onset  . Hypertension Mother   . Diverticulitis Mother   . Colitis Mother   . Diabetes Father   . Hypertension Father   . Heart disease Father   . Diverticulitis Brother   . Diabetes Sister     Social History   Tobacco Use  . Smoking status: Current Every Day Smoker    Packs/day: 1.50    Years: 46.00    Pack years: 69.00    Types: Cigarettes  . Smokeless tobacco: Never Used  Substance Use Topics  . Alcohol use: No    Alcohol/week: 0.0 standard drinks     Current Outpatient Medications:  .  Fluticasone-Umeclidin-Vilant (TRELEGY ELLIPTA) 100-62.5-25 MCG/INH AEPB, Inhale into the lungs., Disp: , Rfl:  .  insulin detemir (LEVEMIR) 100 UNIT/ML injection, Inject 0.04 mLs (4 Units total) into the skin daily., Disp: 10 mL, Rfl: 2 .  lisinopril-hydrochlorothiazide (ZESTORETIC) 20-12.5 MG tablet, Take 1 tablet by mouth once daily, Disp: 90 tablet, Rfl: 1 .  meloxicam (MOBIC) 7.5 MG tablet, Take 7.5 mg by mouth daily., Disp: , Rfl:  .  pravastatin (PRAVACHOL) 20 MG tablet, Take 1 tablet (20 mg total) by mouth daily with supper., Disp: 90 tablet, Rfl: 1 .  tiZANidine (ZANAFLEX) 2 MG tablet, Take 2 mg by mouth every 12 (twelve) hours., Disp: , Rfl:  .  traMADol (ULTRAM) 50 MG tablet, Take by mouth every 12 (twelve) hours as needed., Disp: , Rfl:  .  Vitamin D, Ergocalciferol, (DRISDOL) 1.25 MG (50000 UNIT) CAPS capsule, Take 1 capsule (50,000 Units total) by mouth every 7 (seven) days., Disp: 12 capsule, Rfl: 1 .  blood glucose meter kit and supplies, Dispense based on patient and insurance preference. Use up to four times daily as directed. (FOR ICD-10 E10.9, E11.9). (Patient not taking: Reported  on 09/15/2020), Disp: 1 each, Rfl: 0 .  gabapentin (NEURONTIN) 300 MG capsule, Take 1 capsule (300 mg total) by mouth 3 (three) times daily. (Patient taking differently: Take 300 mg by mouth 3 (three) times daily. Take 1 capsule AM, 1 capsule at noon, and 2 capsules (677m) at bedtime), Disp: 270 capsule, Rfl: 1 .  ipratropium-albuterol (DUONEB) 0.5-2.5 (3) MG/3ML SOLN, Take 3 mLs by nebulization every 6 (six) hours as needed. (Patient not taking: Reported on 09/15/2020), Disp: 360 mL, Rfl: 2  Allergies  Allergen Reactions  . Penicillins Hives and Swelling    As a child. Has patient had a PCN reaction causing immediate rash, facial/tongue/throat swelling, SOB or lightheadedness with hypotension: YES  Has patient had a PCN reaction causing severe rash involving mucus membranes or skin necrosis: UNKNOWN Has patient had a PCN reaction that required hospitalization: UNKNOWN Has patient had a PCN reaction occurring within the last 10 years: NO  . Aspirin Nausea Only  . Ibuprofen Nausea Only  . Lyrica [Pregabalin] Nausea Only  . Acetaminophen Nausea Only    I personally reviewed active problem list, medication list, allergies, family history, social history, health maintenance with the patient/caregiver today.   ROS  Constitutional: Negative for fever or weight change.  Respiratory: positive  for cough and shortness of breath.   Cardiovascular: Negative for chest pain or palpitations.  Gastrointestinal: Negative for abdominal pain, no bowel changes.  Musculoskeletal: Negative for gait problem or joint swelling.  Skin: Negative for rash.  Neurological: Negative for dizziness or headache.  No other specific complaints in a complete review of systems (except as listed in HPI above).  Objective  Vitals:   09/15/20 1421  BP: 126/70  Pulse: 76  Resp: 16  Temp: 98 F (36.7 C)  TempSrc: Oral  SpO2: 97%  Weight: 162 lb 6.4 oz (73.7 kg)  Height: '5\' 11"'  (1.803 m)    Body mass index is  22.65 kg/m.  Physical Exam  Constitutional: Patient appears well-developed and thin  No distress.  HEENT: head atraumatic, normocephalic, pupils equal and reactive to light, neck supple Cardiovascular: Normal rate, regular rhythm and normal heart sounds.  No murmur heard. No BLE edema. Pulmonary/Chest: Effort normal and breath sounds normal. No respiratory distress. Abdominal: Soft.  There is no tenderness. Psychiatric: Patient has a normal mood and affect. behavior is normal. Judgment and thought content normal.  Recent Results (from the past 2160 hour(s))  POCT HgB A1C     Status: Abnormal   Collection Time: 09/15/20  2:25 PM  Result Value Ref Range   Hemoglobin A1C 7.5 (A) 4.0 - 5.6 %   HbA1c POC (<> result, manual entry)  HbA1c, POC (prediabetic range)     HbA1c, POC (controlled diabetic range)     PHQ2/9: Depression screen [facility] 2/9 09/15/2020 03/29/2020 11/20/2019 11/10/2019 07/11/2019  Decreased Interest 0 0 0 0 0  Down, Depressed, Hopeless 0 0 0 0 0  PHQ - 2 Score 0 0 0 0 0  Altered sleeping - 0 - 0 0  Tired, decreased energy - 0 - 0 1  Change in appetite - 0 - 0 0  Feeling bad or failure about yourself  - 0 - 0 0  Trouble concentrating - 0 - 0 0  Moving slowly or fidgety/restless - 0 - 0 0  Suicidal thoughts - 0 - 0 0  PHQ-9 Score - 0 - 0 1  Difficult doing work/chores - - - Not difficult at all Not difficult at all  Some recent data might be hidden    phq 9 is negative   Fall Risk: Fall Risk  09/15/2020 03/29/2020 11/20/2019 11/10/2019 07/29/2019  Falls in the past year? 0 0 0 0 0  Number falls in past yr: 0 0 0 0 -  Injury with Fall? 0 0 0 0 -  Risk for fall due to : - - No Fall Risks - -  Follow up - - Falls prevention discussed - -     Functional Status Survey: Is the patient deaf or have difficulty hearing?: No Does the patient have difficulty seeing, even when wearing glasses/contacts?: Yes Does the patient have difficulty concentrating, remembering, or  making decisions?: No Does the patient have difficulty walking or climbing stairs?: No Does the patient have difficulty dressing or bathing?: No Does the patient have difficulty doing errands alone such as visiting a doctor's office or shopping?: No    Assessment & Plan  1. Type 2 diabetes mellitus with diabetic polyneuropathy, with long-term current use of insulin (HCC)  - POCT HgB A1C - Microalbumin / creatinine urine ratio  2. COPD, moderate ([place])   3. Vitamin D deficiency  Continue supplementation   4. Diabetic polyneuropathy associated with type 2 diabetes mellitus ([place])   5. Peripheral vascular disease ([place])   6. Claudication ([place])   7. Dyslipidemia  - Lipid panel  8. Benign hypertension  - Microalbumin / creatinine urine ratio - COMPLETE METABOLIC PANEL WITH GFR - CBC with Differential/Platelet  9. Paranoid schizophrenia ([place])   10. Purpura, nonthrombocytopenic ([place])   11. Chronic hepatitis C without hepatic coma ([place])   12. Type 2 diabetes mellitus with peripheral vascular disease ([place])   13. Diabetes mellitus with coincident hypertension ([place])  Continue medication

## 2020-09-16 LAB — LIPID PANEL
Cholesterol: 121 mg/dL (ref <200)
HDL: 51 mg/dL (ref >=40)
LDL Cholesterol (Calc): 54 mg/dL (calc)
Non-HDL Cholesterol (Calc): 70 mg/dL (calc) (ref <130)
Total CHOL/HDL Ratio: 2.4 (calc) (ref <5.0)
Triglycerides: 76 mg/dL (ref <150)

## 2020-09-16 LAB — COMPLETE METABOLIC PANEL WITH GFR
AG Ratio: 2 (calc) (ref 1.0–2.5)
ALT: 14 U/L (ref 9–46)
AST: 17 U/L (ref 10–35)
Albumin: 4.3 g/dL (ref 3.6–5.1)
Alkaline phosphatase (APISO): 69 U/L (ref 35–144)
BUN/Creatinine Ratio: 18 (calc) (ref 6–22)
BUN: 12 mg/dL (ref 7–25)
CO2: 33 mmol/L — ABNORMAL HIGH (ref 20–32)
Calcium: 9.4 mg/dL (ref 8.6–10.3)
Chloride: 101 mmol/L (ref 98–110)
Creat: 0.68 mg/dL — ABNORMAL LOW (ref 0.70–1.25)
GFR, Est African American: 116 mL/min/{1.73_m2} (ref >=60)
GFR, Est Non African American: 100 mL/min/{1.73_m2} (ref >=60)
Globulin: 2.2 g/dL (calc) (ref 1.9–3.7)
Glucose, Bld: 171 mg/dL — ABNORMAL HIGH (ref 65–99)
Potassium: 3.9 mmol/L (ref 3.5–5.3)
Sodium: 139 mmol/L (ref 135–146)
Total Bilirubin: 1.2 mg/dL (ref 0.2–1.2)
Total Protein: 6.5 g/dL (ref 6.1–8.1)

## 2020-09-16 LAB — CBC WITH DIFFERENTIAL/PLATELET
Absolute Monocytes: 624 cells/uL (ref 200–950)
Basophils Absolute: 32 cells/uL (ref 0–200)
Basophils Relative: 0.4 %
Eosinophils Absolute: 154 cells/uL (ref 15–500)
Eosinophils Relative: 1.9 %
HCT: 45.6 % (ref 38.5–50.0)
Hemoglobin: 16 g/dL (ref 13.2–17.1)
Lymphs Abs: 1895 cells/uL (ref 850–3900)
MCH: 31.9 pg (ref 27.0–33.0)
MCHC: 35.1 g/dL (ref 32.0–36.0)
MCV: 90.8 fL (ref 80.0–100.0)
MPV: 9.6 fL (ref 7.5–12.5)
Monocytes Relative: 7.7 %
Neutro Abs: 5395 cells/uL (ref 1500–7800)
Neutrophils Relative %: 66.6 %
Platelets: 179 10*3/uL (ref 140–400)
RBC: 5.02 10*6/uL (ref 4.20–5.80)
RDW: 12.2 % (ref 11.0–15.0)
Total Lymphocyte: 23.4 %
WBC: 8.1 10*3/uL (ref 3.8–10.8)

## 2020-09-16 LAB — MICROALBUMIN / CREATININE URINE RATIO
Creatinine, Urine: 45 mg/dL (ref 20–320)
Microalb Creat Ratio: 4 mcg/mg creat (ref <30)
Microalb, Ur: 0.2 mg/dL

## 2020-09-28 DIAGNOSIS — Z79891 Long term (current) use of opiate analgesic: Secondary | ICD-10-CM | POA: Diagnosis not present

## 2020-09-28 DIAGNOSIS — F1120 Opioid dependence, uncomplicated: Secondary | ICD-10-CM | POA: Diagnosis not present

## 2020-10-11 ENCOUNTER — Other Ambulatory Visit: Payer: Self-pay

## 2020-10-11 DIAGNOSIS — E785 Hyperlipidemia, unspecified: Secondary | ICD-10-CM

## 2020-10-11 MED ORDER — PRAVASTATIN SODIUM 20 MG PO TABS: 20.0000 mg | ORAL_TABLET | Freq: Every day | ORAL | 1 refills | Status: DC

## 2020-10-18 DIAGNOSIS — M545 Low back pain, unspecified: Secondary | ICD-10-CM | POA: Diagnosis not present

## 2020-10-18 DIAGNOSIS — M5416 Radiculopathy, lumbar region: Secondary | ICD-10-CM | POA: Diagnosis not present

## 2020-10-18 DIAGNOSIS — G894 Chronic pain syndrome: Secondary | ICD-10-CM | POA: Diagnosis not present

## 2020-10-18 DIAGNOSIS — F209 Schizophrenia, unspecified: Secondary | ICD-10-CM | POA: Diagnosis not present

## 2020-10-18 DIAGNOSIS — I10 Essential (primary) hypertension: Secondary | ICD-10-CM | POA: Diagnosis not present

## 2020-10-18 DIAGNOSIS — M5430 Sciatica, unspecified side: Secondary | ICD-10-CM | POA: Diagnosis not present

## 2020-10-19 ENCOUNTER — Telehealth: Payer: Self-pay

## 2020-10-19 NOTE — Progress Notes (Signed)
Chronic Care Management Pharmacy Assistant   Name: Mark Nash  MRN: [id number] DOB: [birth year]  Reason for Encounter:Diabetes Disease State  Call.  Patient Questions:  1.  Have you seen any other providers since your last visit? Yes, 09/01/2020 Podiatry Caroline More, 09/06/2020 Oral Surgery [facility] ,09/15/2020 PCP Drue Stager, 09/16/2020 Detntistry Helmut Muster, 09/30/2020 Dentistry   2.  Any changes in your medicines or health? No    PCP : [name], MD  Allergies:   Allergies  Allergen Reactions  . Penicillins Hives and Swelling    As a child. Has patient had a PCN reaction causing immediate rash, facial/tongue/throat swelling, SOB or lightheadedness with hypotension: YES  Has patient had a PCN reaction causing severe rash involving mucus membranes or skin necrosis: UNKNOWN Has patient had a PCN reaction that required hospitalization: UNKNOWN Has patient had a PCN reaction occurring within the last 10 years: NO  . Aspirin Nausea Only  . Ibuprofen Nausea Only  . Lyrica [Pregabalin] Nausea Only  . Acetaminophen Nausea Only    Medications: Outpatient Encounter Medications as of 10/19/2020  Medication Sig  . blood glucose meter kit and supplies Dispense based on patient and insurance preference. Use up to four times daily as directed. (FOR ICD-10 E10.9, E11.9). (Patient not taking: Reported on 09/15/2020)  . Fluticasone-Umeclidin-Vilant (TRELEGY ELLIPTA) 100-62.5-25 MCG/INH AEPB Inhale into the lungs.  . gabapentin (NEURONTIN) 300 MG capsule Take 1 capsule (300 mg total) by mouth 3 (three) times daily. (Patient taking differently: Take 300 mg by mouth 3 (three) times daily. Take 1 capsule AM, 1 capsule at noon, and 2 capsules (676m) at bedtime)  . insulin detemir (LEVEMIR) 100 UNIT/ML injection Inject 0.04 mLs (4 Units total) into the skin daily.  .Marland Kitchenlisinopril-hydrochlorothiazide (ZESTORETIC) 20-12.5 MG tablet Take 1 tablet by mouth daily.  . meloxicam (MOBIC) 7.5 MG  tablet Take 7.5 mg by mouth daily.  . pravastatin (PRAVACHOL) 20 MG tablet Take 1 tablet (20 mg total) by mouth daily with supper.  .Marland KitchentiZANidine (ZANAFLEX) 2 MG tablet Take 2 mg by mouth every 12 (twelve) hours.  . traMADol (ULTRAM) 50 MG tablet Take by mouth every 12 (twelve) hours as needed.  . Vitamin D, Ergocalciferol, (DRISDOL) 1.25 MG (50000 UNIT) CAPS capsule Take 1 capsule (50,000 Units total) by mouth every 7 (seven) days.   No facility-administered encounter medications on file as of 10/19/2020.    Current Diagnosis: Patient Active Problem List   Diagnosis Date Noted  . DDD (degenerative disc disease), lumbar 03/06/2019  . Primary osteoarthritis involving multiple joints 03/06/2019  . Lumbar radiculopathy 11/05/2018  . Lumbar post-laminectomy syndrome 11/05/2018  . Diabetic polyneuropathy associated with type 2 diabetes mellitus ([place] 11/05/2018  . CLE (columnar lined esophagus)   . Gastric irritation   . Duodenum ulcer   . Personal history of colonic polyps   . Polyp of sigmoid colon   . Diverticulosis of large intestine without diverticulitis   . Benign neoplasm of ascending colon   . Osteoarthritis of both hands 09/04/2017  . Chronic pain of left knee 09/04/2017  . Abnormal MRI, knee 09/04/2017  . 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 Lymphedema 07/07/2016  . Chronic hepatitis C without hepatic coma ([place] 06/01/2016  . Lumbar herniated disc 11/24/2015  . Thrombocytopenia ([place] 11/11/2015  . Hepatitis C antibody test positive 07/13/2015  . Chronic constipation 07/13/2015  . Abnormal immunological findings in specimens from other organs, systems and tissues 07/13/2015  . CN (constipation) 07/13/2015  . Diabetes  mellitus with coincident hypertension ([place]) 03/08/2015  . COPD, mild ([place]) 03/08/2015  . Dyslipidemia 03/08/2015  . Paranoid schizophrenia ([place]) 03/08/2015  . Acquired polyneuropathy 03/08/2015  . Restless leg 03/08/2015  . Tobacco abuse 03/08/2015   . Callus of foot 03/08/2015  . Claudication ([place]) 03/08/2015  . Chronic obstructive pulmonary disease ([place]) 03/08/2015  . Corn or callus 03/08/2015  . Essential (primary) hypertension 03/08/2015  . HLD (hyperlipidemia) 03/08/2015  . Peripheral vascular disease ([place]) 03/08/2015  . Polyneuropathy 03/08/2015  . Current tobacco use 03/08/2015  . Vitamin D deficiency 04/26/2009  . Avitaminosis D 04/26/2009    Goals Addressed   None    Recent Relevant Labs: Lab Results  Component Value Date/Time   HGBA1C 7.5 (A) 09/15/2020 02:25 PM   HGBA1C 7.1 (A) 03/29/2020 02:48 PM   HGBA1C 7.1 (H) 11/10/2019 02:27 PM   HGBA1C 6.6 07/11/2019 01:45 PM   HGBA1C 6.8 03/10/2019 10:08 AM   MICROALBUR 0.2 09/15/2020 03:00 PM   MICROALBUR 0.3 03/10/2019 10:08 AM   MICROALBUR 50 07/17/2017 02:17 PM   MICROALBUR negative 03/11/2015 10:59 AM    Kidney Function Lab Results  Component Value Date/Time   CREATININE 0.68 (L) 09/15/2020 03:00 PM   CREATININE 0.78 11/10/2019 02:27 PM   GFRNONAA 100 09/15/2020 03:00 PM   GFRAA 116 09/15/2020 03:00 PM    . Current antihyperglycemic regimen:  o Levemir 100 unit/ML- Inject 0.04 mLs into the skin daily . What recent interventions/DTPs have been made to improve glycemic control:  o None ID . Have there been any recent hospitalizations or ED visits since last visit with CPP? No . Patient reports hypoglycemic symptoms, including Pale, Sweaty, Shaky and Nervous/irritable  o Patient states he has these symptoms "Once in awhile"and will eat or drink something sweet for relief. . Patient reports hyperglycemic symptoms, including fatigue  o Patient states he will rest when he feels fatigue. [name] How often are you checking your blood sugar?  o Patient states he does not check his blood sugar at home. . What are your blood sugars ranging?  o Fasting: N/A o Before meals: N/A o After meals: N/A o Bedtime: N/A . During the week, how often does your blood glucose drop  below 70? Patient states he does not think his blood sugar has drop below 70 , but is unsure because he does not check his blood sugars at home . Are you checking your feet daily/regularly?   Patient reports he has neuropathy, and follows up with podiatry.   Adherence Review: Is the patient currently on a STATIN medication? Yes Is the patient currently on ACE/ARB medication? Yes Does the patient have >5 day gap between last estimated fill dates? Yes   Maryjean Ka  Follow-Up:  Pharmacist Review   [place] Clinical Pharmacist Assistant [phone]

## 2020-11-17 DIAGNOSIS — F1120 Opioid dependence, uncomplicated: Secondary | ICD-10-CM | POA: Diagnosis not present

## 2020-11-17 DIAGNOSIS — Z79891 Long term (current) use of opiate analgesic: Secondary | ICD-10-CM | POA: Diagnosis not present

## 2020-11-22 ENCOUNTER — Other Ambulatory Visit: Payer: Self-pay | Admitting: Family Medicine

## 2020-11-22 DIAGNOSIS — I10 Essential (primary) hypertension: Secondary | ICD-10-CM

## 2020-11-23 ENCOUNTER — Ambulatory Visit: Payer: PPO

## 2020-11-24 ENCOUNTER — Telehealth: Payer: Self-pay

## 2020-11-24 ENCOUNTER — Other Ambulatory Visit: Payer: Self-pay | Admitting: Family Medicine

## 2020-11-24 DIAGNOSIS — E1142 Type 2 diabetes mellitus with diabetic polyneuropathy: Secondary | ICD-10-CM

## 2020-11-24 DIAGNOSIS — Z794 Long term (current) use of insulin: Secondary | ICD-10-CM

## 2020-11-24 NOTE — Telephone Encounter (Signed)
Copied from [place] [id number]. Topic: General - Other >> Nov 24, 2020 10:37 AM Pawlus, Brayton Layman A wrote: Reason for CRM: Pt already contacted his pharmacy requesting his refills, wanted to know the status of the refills he requested, Pt is running low on all his meds, please advise.

## 2020-11-29 DIAGNOSIS — F209 Schizophrenia, unspecified: Secondary | ICD-10-CM | POA: Diagnosis not present

## 2020-11-29 DIAGNOSIS — M5430 Sciatica, unspecified side: Secondary | ICD-10-CM | POA: Diagnosis not present

## 2020-11-29 DIAGNOSIS — M545 Low back pain, unspecified: Secondary | ICD-10-CM | POA: Diagnosis not present

## 2020-11-29 DIAGNOSIS — G894 Chronic pain syndrome: Secondary | ICD-10-CM | POA: Diagnosis not present

## 2020-11-29 DIAGNOSIS — M5416 Radiculopathy, lumbar region: Secondary | ICD-10-CM | POA: Diagnosis not present

## 2020-11-29 DIAGNOSIS — I10 Essential (primary) hypertension: Secondary | ICD-10-CM | POA: Diagnosis not present

## 2020-11-29 DIAGNOSIS — M961 Postlaminectomy syndrome, not elsewhere classified: Secondary | ICD-10-CM | POA: Diagnosis not present

## 2020-11-29 DIAGNOSIS — G8929 Other chronic pain: Secondary | ICD-10-CM | POA: Diagnosis not present

## 2020-12-02 ENCOUNTER — Ambulatory Visit (INDEPENDENT_AMBULATORY_CARE_PROVIDER_SITE_OTHER): Payer: PPO

## 2020-12-02 DIAGNOSIS — Z1211 Encounter for screening for malignant neoplasm of colon: Secondary | ICD-10-CM

## 2020-12-02 DIAGNOSIS — Z0000 Encounter for general adult medical examination without abnormal findings: Secondary | ICD-10-CM | POA: Diagnosis not present

## 2020-12-02 NOTE — Progress Notes (Signed)
Subjective:   Mark Nash is a 65 y.o. male who presents for Medicare Annual/Subsequent preventive examination.  Virtual Visit via Telephone Note  I connected with  Mark Nash on 12/02/20 at  9:20 AM EDT by telephone and verified that I am speaking with the correct person using two identifiers.  Location: Patient: home Provider: [place] Persons participating in the virtual visit: [place]   I discussed the limitations, risks, security and privacy concerns of performing an evaluation and management service by telephone and the availability of in person appointments. The patient expressed understanding and agreed to proceed.  Interactive audio and video telecommunications were attempted between this nurse and patient, however failed, due to patient having technical difficulties OR patient did not have access to video capability.  We continued and completed visit with audio only.  Some vital signs may be absent or patient reported.   Clemetine Marker, LPN    Review of Systems     Cardiac Risk Factors include: advanced age (>29mn, >>58women);diabetes mellitus;male gender;dyslipidemia;hypertension;smoking/ tobacco exposure     Objective:    Today's Vitals   12/02/20 0935  PainSc: 6    There is no height or weight on file to calculate BMI.  Advanced Directives 12/02/2020 11/20/2019 11/18/2018 11/05/2018 06/21/2018 12/07/2017 11/30/2017  Does Patient Have a Medical Advance Directive? _0  No No  Would patient like information on creating a medical advance directive? Yes (MAU/Ambulatory/Procedural Areas - Information given) Yes (MAU/Ambulatory/Procedural Areas - Information given) No - Patient declined No - Patient declined No - Patient declined No - Patient declined Yes (MAU/Ambulatory/Procedural Areas - Information given)    Current Medications (verified) Outpatient Encounter Medications as of 12/02/2020  Medication Sig  . LEVEMIR 100 UNIT/ML injection  INJECT 4 UNITS TOTAL INTO THE SKIN DAILY  . lisinopril-hydrochlorothiazide (ZESTORETIC) 20-12.5 MG tablet Take 1 tablet by mouth daily.  . meloxicam (MOBIC) 7.5 MG tablet Take 7.5 mg by mouth daily.  . pravastatin (PRAVACHOL) 20 MG tablet Take 1 tablet (20 mg total) by mouth daily with supper.  .Marland KitchentiZANidine (ZANAFLEX) 2 MG tablet Take 2 mg by mouth every 12 (twelve) hours.  . traMADol (ULTRAM) 50 MG tablet Take by mouth every 12 (twelve) hours as needed.  . Vitamin D, Ergocalciferol, (DRISDOL) 1.25 MG (50000 UNIT) CAPS capsule Take 1 capsule (50,000 Units total) by mouth every 7 (seven) days.  . blood glucose meter kit and supplies Dispense based on patient and insurance preference. Use up to four times daily as directed. (FOR ICD-10 E10.9, E11.9). (Patient not taking: No sig reported)  . Fluticasone-Umeclidin-Vilant (TRELEGY ELLIPTA) 100-62.5-25 MCG/INH AEPB Inhale into the lungs. (Patient not taking: Reported on 12/02/2020)  . gabapentin (NEURONTIN) 300 MG capsule Take 1 capsule (300 mg total) by mouth 3 (three) times daily. (Patient taking differently: Take 300 mg by mouth 3 (three) times daily. Take 1 capsule AM, 1 capsule at noon, and 2 capsules (6056m at bedtime)   No facility-administered encounter medications on file as of 12/02/2020.    Allergies (verified) Penicillins, Aspirin, Ibuprofen, Lyrica [pregabalin], and Acetaminophen   History: Past Medical History:  Diagnosis Date  . Arthritis   . Asthma   . Benign neoplasm of ascending colon    polyps  . Constipation   . COPD (chronic obstructive pulmonary disease) ([place]   NO INHALER  . Diabetes mellitus without complication (HCC)    Type 2  . Diverticulosis   . Dyspnea    RARE-WITH  EXERTION DUE TO COPD  . GERD (gastroesophageal reflux disease)   . Hepatitis C   . History of kidney stones    H/O  . Hyperlipidemia   . Hypertension 06/10/2018   currently not under control  . Left inguinal hernia   . Peripheral vascular  disease ([place])   . Restless legs   . Schizoaffective disorder ([place])    Pt denies  . Thrombocytopenia ([place])   . Vitamin D deficiency    Past Surgical History:  Procedure Laterality Date  . BACK SURGERY    . COLONOSCOPY WITH PROPOFOL N/A 09/07/2017   Procedure: COLONOSCOPY WITH PROPOFOL;  Surgeon: Virgel Manifold, MD;  Location: [facility] ENDOSCOPY;  Service: Endoscopy;  Laterality: N/A;  . ESOPHAGOGASTRODUODENOSCOPY (EGD) WITH PROPOFOL N/A 12/07/2017   Procedure: ESOPHAGOGASTRODUODENOSCOPY (EGD) WITH PROPOFOL;  Surgeon: Virgel Manifold, MD;  Location: [facility] ENDOSCOPY;  Service: Endoscopy;  Laterality: N/A;  . FLEXIBLE SIGMOIDOSCOPY N/A 11/09/2017   Procedure: FLEXIBLE SIGMOIDOSCOPY;  Surgeon: Virgel Manifold, MD;  Location: [facility] ENDOSCOPY;  Service: Endoscopy;  Laterality: N/A;  . HERNIA REPAIR     inguinal  . INGUINAL HERNIA REPAIR Bilateral 06/21/2018   Procedure: LAPAROSCOPIC BILATERAL INGUINAL HERNIA REPAIR;  Surgeon: Vickie Epley, MD;  Location: [facility] ORS;  Service: General;  Laterality: Bilateral;  . L3 TO L5 LAMINECTOMY FOR DECOMPRESSION  07/17/2016   [facility]  . REPAIR OF CEREBROSPINAL FLUID LEAK N/A 09/08/2016   Procedure: Lumbar wound exploration, repair of pseudomenigocele;  Surgeon: Kevan Ny Ditty, MD;  Location: [facility];  Service: Neurosurgery;  Laterality: N/A;  Lumbar wound exploration, repair of pseudomenigocele  . SINUSOTOMY     Family History  Problem Relation Age of Onset  . Hypertension Mother   . Diverticulitis Mother   . Colitis Mother   . Diabetes Father   . Hypertension Father   . Heart disease Father   . Diverticulitis Brother   . Diabetes Sister    Social History   Socioeconomic History  . Marital status: Divorced    Spouse name: Not on file  . Number of children: 2  . Years of education: Not on file  . Highest education level: 12th grade  Occupational History  . Occupation: Disabled  Tobacco Use  . Smoking  status: Current Every Day Smoker    Packs/day: 1.50    Years: 46.00    Pack years: 69.00    Types: Cigarettes  . Smokeless tobacco: Never Used  Vaping Use  . Vaping Use: Never used  Substance and Sexual Activity  . Alcohol use: No    Alcohol/week: 0.0 standard drinks  . Drug use: No  . Sexual activity: Not Currently  Other Topics Concern  . Not on file  Social History Narrative   Lives alone   Social Determinants of Health   Financial Resource Strain: Low Risk   . Difficulty of Paying Living Expenses: Not hard at all  Food Insecurity: No Food Insecurity  . Worried About Charity fundraiser in the Last Year: Never true  . Ran Out of Food in the Last Year: Never true  Transportation Needs: No Transportation Needs  . Lack of Transportation (Medical): No  . Lack of Transportation (Non-Medical): No  Physical Activity: Insufficiently Active  . Days of Exercise per Week: 2 days  . Minutes of Exercise per Session: 20 min  Stress: No Stress Concern Present  . Feeling of Stress : Not at all  Social Connections: Socially Isolated  . Frequency  of Communication with Friends and Family: More than three times a week  . Frequency of Social Gatherings with Friends and Family: Three times a week  . Attends Religious Services: Never  . Active Member of Clubs or Organizations: No  . Attends Archivist Meetings: Never  . Marital Status: Divorced    Tobacco Counseling Ready to quit: Not Answered Counseling given: Not Answered   Clinical Intake:  Pre-visit preparation completed: Yes  Pain : 0-10 Pain Score: 6  Pain Type: Chronic pain Pain Location: Leg Pain Orientation: Right,Left Pain Descriptors / Indicators: Aching,Sore Pain Onset: More than a month ago Pain Frequency: Constant     Nutritional Risks: None Diabetes: Yes CBG done?: No Did pt. bring in CBG monitor from home?: No  How often do you need to have someone help you when you read instructions,  pamphlets, or other written materials from your doctor or pharmacy?: 1 - Never  Nutrition Risk Assessment:  Has the patient had any N/V/D within the last 2 months?  No  Does the patient have any non-healing wounds?  No  Has the patient had any unintentional weight loss or weight gain?  No   Diabetes:  Is the patient diabetic?  Yes  If diabetic, was a CBG obtained today?  No  Did the patient bring in their glucometer from home?  No  How often do you monitor your CBG's? Pt does not actively check blood sugar.   Financial Strains and Diabetes Management:  Are you having any financial strains with the device, your supplies or your medication? No .  Does the patient want to be seen by Chronic Care Management for management of their diabetes?  No  Would the patient like to be referred to a Nutritionist or for Diabetic Management?  No   Diabetic Exams:  Diabetic Eye Exam: Overdue for diabetic eye exam. Pt has been advised about the importance in completing this exam. Pt declined referral.   Diabetic Foot Exam: Completed 09/01/20.    Interpreter Needed?: No  Information entered by :: Clemetine Marker LPN   Activities of Daily Living In your present state of health, do you have any difficulty performing the following activities: 12/02/2020 09/15/2020  Hearing? N N  Comment declines hearing aids -  Vision? N Y  Difficulty concentrating or making decisions? N N  Walking or climbing stairs? N N  Dressing or bathing? N N  Doing errands, shopping? N N  Preparing Food and eating ? N -  Using the Toilet? N -  In the past six months, have you accidently leaked urine? N -  Do you have problems with loss of bowel control? N -  Managing your Medications? N -  Managing your Finances? N -  Housekeeping or managing your Housekeeping? N -  Some recent data might be hidden    Patient Care Team: [name], MD as PCP - General (Family Medicine) Virgel Manifold, MD as Consulting Physician  (Gastroenterology) Leim Fabry, MD as Consulting Physician (Orthopedic Surgery) Caroline More, DPM as Consulting Physician (Podiatry) Nathanial Rancher, MD as Referring Physician (Pain Medicine) Germaine Pomfret, [facility] (Pharmacist)  Indicate any recent Medical Services you may have received from other than [facility] providers in the past year (date may be approximate).     Assessment:   This is a routine wellness examination for [place].  Hearing/Vision screen  Hearing Screening   _0  _1  _2  _3  _4  _5  _6  _7  _8   Right ear:  Left ear:           Comments: Pt denies hearing difficulty  Vision Screening Comments: Past due for eye exam; declines referral  Dietary issues and exercise activities discussed: Current Exercise Habits: Home exercise routine, Type of exercise: walking, Time (Minutes): 20, Frequency (Times/Week): 2, Weekly Exercise (Minutes/Week): 40, Intensity: Mild, Exercise limited by: orthopedic condition(s)  Goals Addressed            This Visit's Progress   . Quit Smoking       If you wish to quit smoking, help is available. For free tobacco cessation program offerings call the [facility] at 724-441-7695 or Live Well Line at 478-085-8822. You may also visit [email or web address] or email [email or web address] for more information on other programs.         Depression Screen PHQ 2/9 Scores 12/02/2020 09/15/2020 03/29/2020 11/20/2019 11/10/2019 07/11/2019 03/10/2019  PHQ - 2 Score 0 0 0 0 0 0 1  PHQ- 9 Score - - 0 - 0 1 4    Fall Risk Fall Risk  12/02/2020 09/15/2020 03/29/2020 11/20/2019 11/10/2019  Falls in the past year? 0 0 0 0 0  Number falls in past yr: 0 0 0 0 0  Injury with Fall? 0 0 0 0 0  Risk for fall due to : No Fall Risks - - No Fall Risks -  Follow up Falls prevention discussed - - Falls prevention discussed -    FALL RISK PREVENTION PERTAINING TO THE HOME:  Any stairs in or around the home? Yes  If so,  are there any without handrails? No  Home free of loose throw rugs in walkways, pet beds, electrical cords, etc? Yes  Adequate lighting in your home to reduce risk of falls? Yes   ASSISTIVE DEVICES UTILIZED TO PREVENT FALLS:  Life alert? No  Use of a cane, walker or w/c? No  Grab bars in the bathroom? No  Shower chair or bench in shower? No  Elevated toilet seat or a handicapped toilet? No   TIMED UP AND GO:  Was the test performed? No . Telephonic visit.   Cognitive Function: Normal cognitive status assessed by direct observation by this Nurse Health Advisor. No abnormalities found.       6CIT Screen 11/30/2017  What Year? 0 points  What month? 0 points  What time? 0 points  Count back from 20 0 points  Months in reverse 0 points  Repeat phrase 0 points  Total Score 0    Immunizations Immunization History  Administered Date(s) Administered  . Influenza Inj Mdck Quad Pf 07/11/2019  . Influenza, High Dose Seasonal PF 06/30/2016  . Influenza,inj,Quad PF,6+ Mos 07/17/2017, 05/29/2018  . Influenza-Unspecified 06/21/2020  . PFIZER(Purple Top)SARS-COV-2 Vaccination 11/13/2019, 12/09/2019, 08/04/2020  . Pneumococcal Conjugate-13 07/13/2015  . Pneumococcal Polysaccharide-23 12/23/2009, 03/29/2020  . Tdap 12/23/2009, 03/29/2020    TDAP status: Up to date  Flu Vaccine status: Up to date  Pneumococcal vaccine status: Up to date  Covid-19 vaccine status: Completed vaccines  Qualifies for Shingles Vaccine? Yes   Zostavax completed No   Shingrix Completed?: No.    Education has been provided regarding the importance of this vaccine. Patient has been advised to call insurance company to determine out of pocket expense if they have not yet received this vaccine. Advised may also receive vaccine at local pharmacy or Health Dept. Verbalized acceptance and understanding.  Screening Tests Health Maintenance  Topic Date Due  . OPHTHALMOLOGY EXAM  Never done  . COLONOSCOPY (Pts  45-71yr Insurance coverage will need to be confirmed)  09/07/2018  . HIV Screening  09/15/2021 (Originally 03/11/1971)  . HEMOGLOBIN A1C  03/15/2021  . INFLUENZA VACCINE  03/28/2021  . FOOT EXAM  09/01/2021  . TETANUS/TDAP  03/29/2030  . COVID-19 Vaccine  Completed  . Hepatitis C Screening  Completed  . PNA vac Low Risk Adult  Completed  . HPV VACCINES  Aged Out    Health Maintenance  Health Maintenance Due  Topic Date Due  . OPHTHALMOLOGY EXAM  Never done  . COLONOSCOPY (Pts 45-420yrInsurance coverage will need to be confirmed)  09/07/2018    Colorectal cancer screening: Type of screening: Colonoscopy. Completed 11/09/17. Repeat every 1 years  Lung Cancer Screening: (Low Dose CT Chest recommended if Age 66-80ears, 30 pack-year currently smoking OR have quit w/in 15years.) does qualify. Pt declines this screening.   Additional Screening:  Hepatitis C Screening: does qualify; Completed 09/15/20  Vision Screening: Recommended annual ophthalmology exams for early detection of glaucoma and other disorders of the eye. Is the patient up to date with their annual eye exam?  No  Who is the provider or what is the name of the office in which the patient attends annual eye exams? Not established If pt is not established with a provider, would they like to be referred to a provider to establish care? No .   Dental Screening: Recommended annual dental exams for proper oral hygiene  Community Resource Referral / Chronic Care Management: CRR required this visit?  No   CCM required this visit?  No  - pt declined     Plan:     I have personally reviewed and noted the following in the patient's chart:   . Medical and social history . Use of alcohol, tobacco or illicit drugs  . Current medications and supplements . Functional ability and status . Nutritional status . Physical activity . Advanced directives . List of other physicians . Hospitalizations, surgeries, and ER visits  in previous 12 months . Vitals . Screenings to include cognitive, depression, and falls . Referrals and appointments  In addition, I have reviewed and discussed with patient certain preventive protocols, quality metrics, and best practice recommendations. A written personalized care plan for preventive services as well as general preventive health recommendations were provided to patient.     KaClemetine MarkerLPN   4/11/30/1710 Nurse Notes: none

## 2020-12-02 NOTE — Patient Instructions (Signed)
[name] , Thank you for taking time to come for your Medicare Wellness Visit. I appreciate your ongoing commitment to your health goals. Please review the following plan we discussed and let me know if I can assist you in the future.   Screening recommendations/referrals: Colonoscopy: done 11/09/17. A referral was sent to [facility]ology today for repeat screening colonoscopy. They will contact your for an appointment.  Recommended yearly ophthalmology/optometry visit for glaucoma screening and checkup Recommended yearly dental visit for hygiene and checkup  Vaccinations: Influenza vaccine: done 06/21/20 Pneumococcal vaccine: done 03/29/20 Tdap vaccine: done 03/29/20 Shingles vaccine: Shingrix discussed. Please contact your pharmacy for coverage information.  Covid-19:  Done 11/13/19, 12/09/19 & 08/04/20  Advanced directives: Advance directive discussed with you today. I have provided a copy for you to complete at home and have notarized. Once this is complete please bring a copy in to our office so we can scan it into your chart.  Conditions/risks identified: If you wish to quit smoking, help is available. For free tobacco cessation program offerings call the [facility] at (619)395-4091 or Live Well Line at 786-276-9746. You may also visit [email or web address] or email [email or web address] for more information on other programs.    Next appointment: Follow up in one year for your annual wellness visit.   Preventive Care 66 Years and Older, Male Preventive care refers to lifestyle choices and visits with your health care provider that can promote health and wellness. What does preventive care include?  A yearly physical exam. This is also called an annual well check.  Dental exams once or twice a year.  Routine eye exams. Ask your health care provider how often you should have your eyes checked.  Personal lifestyle choices, including:  Daily care of your  teeth and gums.  Regular physical activity.  Eating a healthy diet.  Avoiding tobacco and drug use.  Limiting alcohol use.  Practicing safe sex.  Taking low doses of aspirin every day.  Taking vitamin and mineral supplements as recommended by your health care provider. What happens during an annual well check? The services and screenings done by your health care provider during your annual well check will depend on your age, overall health, lifestyle risk factors, and family history of disease. Counseling  Your health care provider may ask you questions about your:  Alcohol use.  Tobacco use.  Drug use.  Emotional well-being.  Home and relationship well-being.  Sexual activity.  Eating habits.  History of falls.  Memory and ability to understand (cognition).  Work and work Statistician. Screening  You may have the following tests or measurements:  Height, weight, and BMI.  Blood pressure.  Lipid and cholesterol levels. These may be checked every 5 years, or more frequently if you are over 66 years old.  Skin check.  Lung cancer screening. You may have this screening every year starting at age 66 if you have a 30-pack-year history of smoking and currently smoke or have quit within the past 15 years.  Fecal occult blood test (FOBT) of the stool. You may have this test every year starting at age 66.  Flexible sigmoidoscopy or colonoscopy. You may have a sigmoidoscopy every 5 years or a colonoscopy every 10 years starting at age 66.  Prostate cancer screening. Recommendations will vary depending on your family history and other risks.  Hepatitis C blood test.  Hepatitis B blood test.  Sexually transmitted disease (STD) testing.  Diabetes screening. This is  done by checking your blood sugar (glucose) after you have not eaten for a while (fasting). You may have this done every 1-3 years.  Abdominal aortic aneurysm (AAA) screening. You may need this if you  are a current or former smoker.  Osteoporosis. You may be screened starting at age 66 if you are at high risk. Talk with your health care provider about your test results, treatment options, and if necessary, the need for more tests. Vaccines  Your health care provider may recommend certain vaccines, such as:  Influenza vaccine. This is recommended every year.  Tetanus, diphtheria, and acellular pertussis (Tdap, Td) vaccine. You may need a Td booster every 10 years.  Zoster vaccine. You may need this after age 21.  Pneumococcal 13-valent conjugate (PCV13) vaccine. One dose is recommended after age 66.  Pneumococcal polysaccharide (PPSV23) vaccine. One dose is recommended after age 66. Talk to your health care provider about which screenings and vaccines you need and how often you need them. This information is not intended to replace advice given to you by your health care provider. Make sure you discuss any questions you have with your health care provider. Document Released: 09/10/2015 Document Revised: 05/03/2016 Document Reviewed: 06/15/2015 Elsevier Interactive Patient Education  2017 [place] Prevention in the Home Falls can cause injuries. They can happen to people of all ages. There are many things you can do to make your home safe and to help prevent falls. What can I do on the outside of my home?  Regularly fix the edges of walkways and driveways and fix any cracks.  Remove anything that might make you trip as you walk through a door, such as a raised step or threshold.  Trim any bushes or trees on the path to your home.  Use bright outdoor lighting.  Clear any walking paths of anything that might make someone trip, such as rocks or tools.  Regularly check to see if handrails are loose or broken. Make sure that both sides of any steps have handrails.  Any raised decks and porches should have guardrails on the edges.  Have any leaves, snow, or ice cleared  regularly.  Use sand or salt on walking paths during winter.  Clean up any spills in your garage right away. This includes oil or grease spills. What can I do in the bathroom?  Use night lights.  Install grab bars by the toilet and in the tub and shower. Do not use towel bars as grab bars.  Use non-skid mats or decals in the tub or shower.  If you need to sit down in the shower, use a plastic, non-slip stool.  Keep the floor dry. Clean up any water that spills on the floor as soon as it happens.  Remove soap buildup in the tub or shower regularly.  Attach bath mats securely with double-sided non-slip rug tape.  Do not have throw rugs and other things on the floor that can make you trip. What can I do in the bedroom?  Use night lights.  Make sure that you have a light by your bed that is easy to reach.  Do not use any sheets or blankets that are too big for your bed. They should not hang down onto the floor.  Have a firm chair that has side arms. You can use this for support while you get dressed.  Do not have throw rugs and other things on the floor that can make you trip. What  can I do in the kitchen?  Clean up any spills right away.  Avoid walking on wet floors.  Keep items that you use a lot in easy-to-reach places.  If you need to reach something above you, use a strong step stool that has a grab bar.  Keep electrical cords out of the way.  Do not use floor polish or wax that makes floors slippery. If you must use wax, use non-skid floor wax.  Do not have throw rugs and other things on the floor that can make you trip. What can I do with my stairs?  Do not leave any items on the stairs.  Make sure that there are handrails on both sides of the stairs and use them. Fix handrails that are broken or loose. Make sure that handrails are as long as the stairways.  Check any carpeting to make sure that it is firmly attached to the stairs. Fix any carpet that is loose  or worn.  Avoid having throw rugs at the top or bottom of the stairs. If you do have throw rugs, attach them to the floor with carpet tape.  Make sure that you have a light switch at the top of the stairs and the bottom of the stairs. If you do not have them, ask someone to add them for you. What else can I do to help prevent falls?  Wear shoes that:  Do not have high heels.  Have rubber bottoms.  Are comfortable and fit you well.  Are closed at the toe. Do not wear sandals.  If you use a stepladder:  Make sure that it is fully opened. Do not climb a closed stepladder.  Make sure that both sides of the stepladder are locked into place.  Ask someone to hold it for you, if possible.  Clearly mark and make sure that you can see:  Any grab bars or handrails.  First and last steps.  Where the edge of each step is.  Use tools that help you move around (mobility aids) if they are needed. These include:  Canes.  Walkers.  Scooters.  Crutches.  Turn on the lights when you go into a dark area. Replace any light bulbs as soon as they burn out.  Set up your furniture so you have a clear path. Avoid moving your furniture around.  If any of your floors are uneven, fix them.  If there are any pets around you, be aware of where they are.  Review your medicines with your doctor. Some medicines can make you feel dizzy. This can increase your chance of falling. Ask your doctor what other things that you can do to help prevent falls. This information is not intended to replace advice given to you by your health care provider. Make sure you discuss any questions you have with your health care provider. Document Released: 06/10/2009 Document Revised: 01/20/2016 Document Reviewed: 09/18/2014 Elsevier Interactive Patient Education  2017 [employer].

## 2020-12-23 ENCOUNTER — Encounter: Payer: Self-pay | Admitting: *Deleted

## 2020-12-28 ENCOUNTER — Other Ambulatory Visit: Payer: Self-pay | Admitting: Family Medicine

## 2020-12-28 NOTE — Telephone Encounter (Signed)
Last seen: 1.19.2022 by Dr Ancil Boozer Next scheduled: 5.20.2022 by Dr Ancil Boozer

## 2020-12-28 NOTE — Telephone Encounter (Signed)
   Notes to clinic:  Medication filled by a historical provider  Review for continued use and  refill     Requested Prescriptions  Pending Prescriptions Disp Refills   [place] 100-62.5-25 MCG/INH AEPB [Pharmacy Med Name: Trelegy Ellipta 100-62.5-25 MCG/INH Inhalation Aerosol Powder Breath Activated] 60 each 0    Sig: INHALE 1 PUFF ONCE DAILY      Off-Protocol Failed - 12/28/2020  3:13 PM      Failed - Medication not assigned to a protocol, review manually.      Passed - Valid encounter within last 12 months    Recent Outpatient Visits           3 months ago Type 2 diabetes mellitus with diabetic polyneuropathy, with long-term current use of insulin [facility])   [facility] [place], Drue Stager, MD   9 months ago Type 2 diabetes mellitus with diabetic polyneuropathy, with long-term current use of insulin [facility])   [facility] [name], MD   1 year ago COPD, moderate [facility])   [facility] [place], Drue Stager, MD   1 year ago Type 2 diabetes mellitus with diabetic polyneuropathy, with long-term current use of insulin [facility])   [facility] [place], Drue Stager, MD   1 year ago Type 2 diabetes mellitus with diabetic polyneuropathy, with long-term current use of insulin [facility])   [facility] [name], MD       Future Appointments             In 2 weeks Ancil Boozer, Drue Stager, MD [facility] [place]   In 11 months  [place]

## 2021-01-05 DIAGNOSIS — L851 Acquired keratosis [keratoderma] palmaris et plantaris: Secondary | ICD-10-CM | POA: Diagnosis not present

## 2021-01-05 DIAGNOSIS — B351 Tinea unguium: Secondary | ICD-10-CM | POA: Diagnosis not present

## 2021-01-05 DIAGNOSIS — E1142 Type 2 diabetes mellitus with diabetic polyneuropathy: Secondary | ICD-10-CM | POA: Diagnosis not present

## 2021-01-10 DIAGNOSIS — M545 Low back pain, unspecified: Secondary | ICD-10-CM | POA: Diagnosis not present

## 2021-01-10 DIAGNOSIS — M5416 Radiculopathy, lumbar region: Secondary | ICD-10-CM | POA: Diagnosis not present

## 2021-01-10 DIAGNOSIS — M961 Postlaminectomy syndrome, not elsewhere classified: Secondary | ICD-10-CM | POA: Diagnosis not present

## 2021-01-10 DIAGNOSIS — G894 Chronic pain syndrome: Secondary | ICD-10-CM | POA: Diagnosis not present

## 2021-01-10 DIAGNOSIS — F209 Schizophrenia, unspecified: Secondary | ICD-10-CM | POA: Diagnosis not present

## 2021-01-10 DIAGNOSIS — G8929 Other chronic pain: Secondary | ICD-10-CM | POA: Diagnosis not present

## 2021-01-10 DIAGNOSIS — M5430 Sciatica, unspecified side: Secondary | ICD-10-CM | POA: Diagnosis not present

## 2021-01-10 DIAGNOSIS — I10 Essential (primary) hypertension: Secondary | ICD-10-CM | POA: Diagnosis not present

## 2021-01-12 ENCOUNTER — Telehealth: Payer: Self-pay

## 2021-01-12 NOTE — Progress Notes (Signed)
ERROR

## 2021-01-12 NOTE — Progress Notes (Signed)
    Chronic Care Management Pharmacy Assistant   Name: Mark Nash  MRN: [id number] DOB: 1956/05/05  Reason for Encounter: Medication Review/General adherence Call.   Recent office visits:  No recent Office Visit  Recent consult visits:  12/08/2020 Helmut Muster [facility]) 12/06/2020 Helmut Muster [facility]) 11/23/2020 Helmut Muster [facility]) 10/25/2020 Helmut Muster ([facility] visits:  None in previous 6 months  Medications: Outpatient Encounter Medications as of 01/12/2021  Medication Sig Note  . TRELEGY ELLIPTA 100-62.5-25 MCG/INH AEPB INHALE 1 PUFF ONCE DAILY   . blood glucose meter kit and supplies Dispense based on patient and insurance preference. Use up to four times daily as directed. (FOR ICD-10 E10.9, E11.9). (Patient not taking: No sig reported)   . gabapentin (NEURONTIN) 300 MG capsule Take 1 capsule (300 mg total) by mouth 3 (three) times daily. (Patient taking differently: Take 300 mg by mouth 3 (three) times daily. Take 1 capsule AM, 1 capsule at noon, and 2 capsules (615m) at bedtime)   . LEVEMIR 100 UNIT/ML injection INJECT 4 UNITS TOTAL INTO THE SKIN DAILY 12/02/2020: Pt taking 6 units daily  . lisinopril-hydrochlorothiazide (ZESTORETIC) 20-12.5 MG tablet Take 1 tablet by mouth daily.   . meloxicam (MOBIC) 7.5 MG tablet Take 7.5 mg by mouth daily.   . pravastatin (PRAVACHOL) 20 MG tablet Take 1 tablet (20 mg total) by mouth daily with supper.   .Marland KitchentiZANidine (ZANAFLEX) 2 MG tablet Take 2 mg by mouth every 12 (twelve) hours.   . traMADol (ULTRAM) 50 MG tablet Take by mouth every 12 (twelve) hours as needed.   . Vitamin D, Ergocalciferol, (DRISDOL) 1.25 MG (50000 UNIT) CAPS capsule Take 1 capsule (50,000 Units total) by mouth every 7 (seven) days.    No facility-administered encounter medications on file as of 01/12/2021.   Star Rating Drugs: Pravastatin 20 mg [facility]  Called patient and discussed medication adherence  with patient, no issues at this time with current medication.   Patient denies ED visit since her last CPP follow up.  Patient denies any side effects with her medication. Patient denies any problems with her current pharmacy  Schedule a telephone follow up with clinical pharmacist on 03/09/2021 at 3:00 pm.Sent message to scheduler.  [place]armacist Assistant [phone]

## 2021-01-14 ENCOUNTER — Ambulatory Visit: Payer: PPO | Admitting: Family Medicine

## 2021-02-16 ENCOUNTER — Other Ambulatory Visit: Payer: Self-pay | Admitting: Family Medicine

## 2021-02-16 NOTE — Telephone Encounter (Signed)
Requested medication (s) are due for refill today: yes  Requested medication (s) are on the active medication list: yes  Last refill:  12/29/2020  Future visit scheduled: yes  Notes to clinic: Medication not assigned to a protocol, review manually   Requested Prescriptions  Pending Prescriptions Disp Refills   [place] 100-62.5-25 MCG/INH AEPB [Pharmacy Med Name: Trelegy Ellipta 100-62.5-25 MCG/INH Inhalation Aerosol Powder Breath Activated] 60 each 0    Sig: INHALE 1 PUFF ONCE DAILY      Off-Protocol Failed - 02/16/2021  9:26 AM      Failed - Medication not assigned to a protocol, review manually.      Passed - Valid encounter within last 12 months    Recent Outpatient Visits           5 months ago Type 2 diabetes mellitus with diabetic polyneuropathy, with long-term current use of insulin [facility])   [facility] [place], Drue Stager, MD   10 months ago Type 2 diabetes mellitus with diabetic polyneuropathy, with long-term current use of insulin [facility])   [facility] [name], MD   1 year ago COPD, moderate [facility])   [facility], Drue Stager, MD   1 year ago Type 2 diabetes mellitus with diabetic polyneuropathy, with long-term current use of insulin [facility])   [facility] [place], Drue Stager, MD   1 year ago Type 2 diabetes mellitus with diabetic polyneuropathy, with long-term current use of insulin [facility])   [facility] [name], MD       Future Appointments             In 4 weeks Ancil Boozer, Drue Stager, MD [facility] [place]   In 9 months  [place]

## 2021-03-07 DIAGNOSIS — Z716 Tobacco abuse counseling: Secondary | ICD-10-CM | POA: Diagnosis not present

## 2021-03-07 DIAGNOSIS — M5416 Radiculopathy, lumbar region: Secondary | ICD-10-CM | POA: Diagnosis not present

## 2021-03-07 DIAGNOSIS — M545 Low back pain, unspecified: Secondary | ICD-10-CM | POA: Diagnosis not present

## 2021-03-07 DIAGNOSIS — F209 Schizophrenia, unspecified: Secondary | ICD-10-CM | POA: Diagnosis not present

## 2021-03-07 DIAGNOSIS — I10 Essential (primary) hypertension: Secondary | ICD-10-CM | POA: Diagnosis not present

## 2021-03-07 DIAGNOSIS — F1120 Opioid dependence, uncomplicated: Secondary | ICD-10-CM | POA: Diagnosis not present

## 2021-03-07 DIAGNOSIS — M5430 Sciatica, unspecified side: Secondary | ICD-10-CM | POA: Diagnosis not present

## 2021-03-07 DIAGNOSIS — G894 Chronic pain syndrome: Secondary | ICD-10-CM | POA: Diagnosis not present

## 2021-03-07 DIAGNOSIS — Z720 Tobacco use: Secondary | ICD-10-CM | POA: Diagnosis not present

## 2021-03-07 DIAGNOSIS — M961 Postlaminectomy syndrome, not elsewhere classified: Secondary | ICD-10-CM | POA: Diagnosis not present

## 2021-03-07 DIAGNOSIS — E119 Type 2 diabetes mellitus without complications: Secondary | ICD-10-CM | POA: Diagnosis not present

## 2021-03-08 ENCOUNTER — Telehealth: Payer: Self-pay

## 2021-03-08 DIAGNOSIS — M159 Polyosteoarthritis, unspecified: Secondary | ICD-10-CM | POA: Diagnosis not present

## 2021-03-08 DIAGNOSIS — Z791 Long term (current) use of non-steroidal anti-inflammatories (NSAID): Secondary | ICD-10-CM | POA: Diagnosis not present

## 2021-03-08 DIAGNOSIS — M5136 Other intervertebral disc degeneration, lumbar region: Secondary | ICD-10-CM | POA: Diagnosis not present

## 2021-03-08 NOTE — Progress Notes (Signed)
    Chronic Care Management Pharmacy Assistant   Name: Mark Nash  MRN: [id number] DOB: [birth year]  Reason for Encounter: Medication Review/General Adherence Call.   Recent office visits:  No recent Office Visit  Recent consult visits:  No recent [facility] visits:  None in previous 6 months  Medications: Outpatient Encounter Medications as of 03/08/2021  Medication Sig Note   blood glucose meter kit and supplies Dispense based on patient and insurance preference. Use up to four times daily as directed. (FOR ICD-10 E10.9, E11.9). (Patient not taking: No sig reported)    gabapentin (NEURONTIN) 300 MG capsule Take 1 capsule (300 mg total) by mouth 3 (three) times daily. (Patient taking differently: Take 300 mg by mouth 3 (three) times daily. Take 1 capsule AM, 1 capsule at noon, and 2 capsules ($RemoveBef'600mg'[id number]$ ) at bedtime)    LEVEMIR 100 UNIT/ML injection INJECT 4 UNITS TOTAL INTO THE SKIN DAILY 12/02/2020: Pt taking 6 units daily   lisinopril-hydrochlorothiazide (ZESTORETIC) 20-12.5 MG tablet Take 1 tablet by mouth daily.    meloxicam (MOBIC) 7.5 MG tablet Take 7.5 mg by mouth daily.    pravastatin (PRAVACHOL) 20 MG tablet Take 1 tablet (20 mg total) by mouth daily with supper.    tiZANidine (ZANAFLEX) 2 MG tablet Take 2 mg by mouth every 12 (twelve) hours.    traMADol (ULTRAM) 50 MG tablet Take by mouth every 12 (twelve) hours as needed.    TRELEGY ELLIPTA 100-62.5-25 MCG/INH AEPB INHALE 1 PUFF ONCE DAILY    Vitamin D, Ergocalciferol, (DRISDOL) 1.25 MG (50000 UNIT) CAPS capsule Take 1 capsule (50,000 Units total) by mouth every 7 (seven) days.    No facility-administered encounter medications on file as of 03/08/2021.    Care Gaps: Ophthalmology Exam,Shingrix,Colonoscopy,COVID -19 Vaccine Star Rating Drugs: Pravastatin 20 mg last filled on 01/04/2021 for 90 day supply at [facility]. Lisinopril-hydrochlorothiazide 20-12.5 MG last filled 02/16/2021 for 90 day supply at  [facility] Medication Fill History:  None ID  Per Clinical Pharmacist,Please  reschedule patient telephone appointment on 03/09/2021 for CCM at 3:00 pm CCM services.  Patient states he thought he cancel this appointment, and would like to end his CCM services for his convenience. Sent a message to the scheduler to cancel appointment and Notified Clinical Pharmacist.  [place] Pharmacist Assistant [phone]

## 2021-03-09 ENCOUNTER — Telehealth: Payer: PPO

## 2021-03-11 DIAGNOSIS — R079 Chest pain, unspecified: Secondary | ICD-10-CM | POA: Diagnosis not present

## 2021-03-15 NOTE — Progress Notes (Signed)
Name: Mark Nash   MRN: [id number]    DOB: 08-31-1955   Date:03/16/2021       Progress Note  Subjective  Chief Complaint  Follow Up  HPI  Lumbar radiculitis: he had back surgery in 06/2016 and 08/2016. He is currently doing okay. Currently seeing  Dr. Humphrey Rolls and is taking , Tramadol, Tizanidine and higher dose gabapentin 300 mg morning, lunch and 2 at nihgt, his pain  6/10  right now , he states he helps move his brother around that is 300 lbs Pain is described as sharp, aggravated by activity, and started to radiate to right groin again. Stable    DMII: he is still on Levemir 6units, denies hypoglycemia, not checking glucose due to cost of strips , his A1C is at goal for him at 7.3 % and down from 7.5 % He is on ACE and a statin, and has neuropathy. Pain from neuropathy is stable with  Gabapentin three times daily He also has PVD. He is due for his eye exam, states he has noticed blurred vision lately      PAD: edema resolved, on statin therapy now but not on aspirin because it bothers his stomach/nauseated  ,  he states  pain medication helps and he can walk for 15 -25  minutes , seen by vascular surgeon in the past, he has not been wearing compression stocking hoses lately, just diabetic socks and symptoms are also stable , he has noticed redness on lower legs/stable     COPD: he always has daily morning  productive cough. He has some SOB with activity but doing better since taking Trelegy. He can tell when he skips medication. He is still smoking between 1-1.5 packs per day.He is not ready to quit smoking    Hyperlipidemia: taking Pravastatin 20 mg daily and denies myalgias. Last LDL was 54    HTN: taking bp medication down to one pill daily, no chest pain, or palpitation BP is at goal, denies orthostatic changes    Schizophrenia: denies any hallucinations, no depressed mood or suicidal thoughts or ideation, not on medication and refuses to see Psychiatrist. He states he helps his family.  Taking care of mother, sister and brother Mother died in 02-19-2023 but still helping his brother   Hepatitis C antibody positive: he has a remote history of drug use from age 14 till 25. Used injectable drug use at the time, he has positive hepatitis C , he went to see GI but did not discuss hepatitis C. He has seen GI, he has a large polyp removed l 2019 and was supposed to go back for repeat colonoscopy but he keeps postponing . Unchanged    Chronic Constipation: he states bowel movements have improvement   He had EGD and colonoscopy in 2019 , past due for repeat testing secondary to large sigmoid polyp, he states because of COVID-19 ,, last visit he told me he was waiting to have dentures , and now he is ready to go back for colonoscopy He will contact Dr. Bonna Gains today    Senile purpura: both arms. on both upper extremity.,states uses his arms to break his brother's storm door   New onset Afib: found on auscultation today, CHADVasc score of 4, discussed risk of bleeding and anti-coagulation, he is willing to see cardiologist   Patient Active Problem List   Diagnosis Date Noted   Diabetes mellitus type 2 with peripheral artery disease ([place]) 03/16/2021   DDD (degenerative disc disease), lumbar  03/06/2019   Primary osteoarthritis involving multiple joints 03/06/2019   Lumbar radiculopathy 11/05/2018   Lumbar post-laminectomy syndrome 11/05/2018   Diabetic polyneuropathy associated with type 2 diabetes mellitus ([place]) 11/05/2018   CLE (columnar lined esophagus)    Gastric irritation    Duodenum ulcer    Personal history of colonic polyps    Polyp of sigmoid colon    Diverticulosis of large intestine without diverticulitis    Benign neoplasm of ascending colon    Osteoarthritis of both hands 09/04/2017   Chronic pain of left knee 09/04/2017   Abnormal MRI, knee 09/04/2017   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   Lymphedema 07/07/2016   Chronic hepatitis C without hepatic coma ([place])  06/01/2016   Lumbar herniated disc 11/24/2015   Thrombocytopenia ([place]) 11/11/2015   Hepatitis C antibody test positive 07/13/2015   Chronic constipation 07/13/2015   Abnormal immunological findings in specimens from other organs, systems and tissues 07/13/2015   CN (constipation) 07/13/2015   Diabetes mellitus with coincident hypertension ([place]) 03/08/2015   COPD, mild ([place]) 03/08/2015   Dyslipidemia 03/08/2015   Paranoid schizophrenia ([place]) 03/08/2015   Acquired polyneuropathy 03/08/2015   Restless leg 03/08/2015   Tobacco abuse 03/08/2015   Callus of foot 03/08/2015   Claudication ([place]) 03/08/2015   Chronic obstructive pulmonary disease ([place]) 03/08/2015   Corn or callus 03/08/2015   Essential (primary) hypertension 03/08/2015   HLD (hyperlipidemia) 03/08/2015   Peripheral vascular disease ([place]) 03/08/2015   Polyneuropathy 03/08/2015   Current tobacco use 03/08/2015   Vitamin D deficiency 04/26/2009   Avitaminosis D 04/26/2009    Past Surgical History:  Procedure Laterality Date   BACK SURGERY     COLONOSCOPY WITH PROPOFOL N/A 09/07/2017   Procedure: COLONOSCOPY WITH PROPOFOL;  Surgeon: Virgel Manifold, MD;  Location: [facility] ENDOSCOPY;  Service: Endoscopy;  Laterality: N/A;   ESOPHAGOGASTRODUODENOSCOPY (EGD) WITH PROPOFOL N/A 12/07/2017   Procedure: ESOPHAGOGASTRODUODENOSCOPY (EGD) WITH PROPOFOL;  Surgeon: Virgel Manifold, MD;  Location: [facility] ENDOSCOPY;  Service: Endoscopy;  Laterality: N/A;   FLEXIBLE SIGMOIDOSCOPY N/A 11/09/2017   Procedure: FLEXIBLE SIGMOIDOSCOPY;  Surgeon: Virgel Manifold, MD;  Location: [facility] ENDOSCOPY;  Service: Endoscopy;  Laterality: N/A;   HERNIA REPAIR     inguinal   INGUINAL HERNIA REPAIR Bilateral 06/21/2018   Procedure: LAPAROSCOPIC BILATERAL INGUINAL HERNIA REPAIR;  Surgeon: Vickie Epley, MD;  Location: [facility] ORS;  Service: General;  Laterality: Bilateral;   L3 TO L5 LAMINECTOMY FOR DECOMPRESSION  07/17/2016   [place] [facility]  [facility] SPINE   REPAIR OF CEREBROSPINAL FLUID LEAK N/A 09/08/2016   Procedure: Lumbar wound exploration, repair of pseudomenigocele;  Surgeon: Kevan Ny Ditty, MD;  Location: [facility];  Service: Neurosurgery;  Laterality: N/A;  Lumbar wound exploration, repair of pseudomenigocele   SINUSOTOMY      Family History  Problem Relation Age of Onset   Heart failure Mother    Hypertension Mother    Diverticulitis Mother    Colitis Mother    Diabetes Father    Hypertension Father    Heart disease Father    Diabetes Sister    Diverticulitis Brother     Social History   Tobacco Use   Smoking status: Every Day    Packs/day: 1.50    Years: 46.00    Pack years: 69.00    Types: Cigarettes   Smokeless tobacco: Never  Substance Use Topics   Alcohol use: No    Alcohol/week: 0.0 standard drinks     Current Outpatient Medications:  gabapentin (NEURONTIN) 300 MG capsule, Take 1-2 capsules by mouth in the morning, at noon, and at bedtime., Disp: , Rfl:    meloxicam (MOBIC) 7.5 MG tablet, Take 7.5 mg by mouth daily., Disp: , Rfl:    tiZANidine (ZANAFLEX) 2 MG tablet, Take 2 mg by mouth every 12 (twelve) hours., Disp: , Rfl:    traMADol (ULTRAM) 50 MG tablet, Take by mouth every 12 (twelve) hours as needed., Disp: , Rfl:    Zoster Vaccine Adjuvanted (SHINGRIX) injection, Inject 0.5 mLs into the muscle once for 1 dose., Disp: 0.5 mL, Rfl: 1   Fluticasone-Umeclidin-Vilant (TRELEGY ELLIPTA) 100-62.5-25 MCG/INH AEPB, Take 1 puff by mouth every evening., Disp: 60 each, Rfl: 5   insulin detemir (LEVEMIR) 100 UNIT/ML injection, Inject 0.06 mLs (6 Units total) into the skin at bedtime., Disp: 10 mL, Rfl: 0   lisinopril-hydrochlorothiazide (ZESTORETIC) 20-12.5 MG tablet, Take 1 tablet by mouth daily., Disp: 90 tablet, Rfl: 1   pravastatin (PRAVACHOL) 20 MG tablet, Take 1 tablet (20 mg total) by mouth daily with supper., Disp: 90 tablet, Rfl: 1   Vitamin D, Ergocalciferol, (DRISDOL) 1.25 MG (50000 UNIT)  CAPS capsule, Take 1 capsule (50,000 Units total) by mouth every 7 (seven) days., Disp: 12 capsule, Rfl: 1  Allergies  Allergen Reactions   Penicillins Hives and Swelling    As a child. Has patient had a PCN reaction causing immediate rash, facial/tongue/throat swelling, SOB or lightheadedness with hypotension: YES  Has patient had a PCN reaction causing severe rash involving mucus membranes or skin necrosis: UNKNOWN Has patient had a PCN reaction that required hospitalization: UNKNOWN Has patient had a PCN reaction occurring within the last 10 years: NO   Aspirin Nausea Only   Ibuprofen Nausea Only   Lyrica [Pregabalin] Nausea Only   Acetaminophen Nausea Only    I personally reviewed active problem list, medication list, allergies, family history, social history, health maintenance with the patient/caregiver today.   ROS  Constitutional: Negative for fever or weight change.  Respiratory: Negative for cough and shortness of breath.   Cardiovascular: Negative for chest pain or palpitations.  Gastrointestinal: Negative for abdominal pain, no bowel changes.  Musculoskeletal: Negative for gait problem or joint swelling.  Skin: Negative for rash.  Neurological: Negative for dizziness or headache.  No other specific complaints in a complete review of systems (except as listed in HPI above).   Objective  Vitals:   03/16/21 1108  BP: 122/70  Pulse: 87  Resp: 16  Temp: 98.1 F (36.7 C)  TempSrc: Oral  SpO2: 96%  Weight: 165 lb (74.8 kg)  Height: 5\' 11"  (1.803 m)    Body mass index is 23.01 kg/m.  Physical Exam  Constitutional: Patient appears well-developed and well-nourished. No distress.  HEENT: head atraumatic, normocephalic, pupils equal and reactive to light, neck supple Cardiovascular: Normal rate, irregular rhythm and normal heart sounds.  No murmur heard. No BLE edema. Pulmonary/Chest: Effort normal and breath sounds normal. No respiratory distress. Abdominal:  Soft.  There is no tenderness. Skin: stasis dermatitis lower legs, also ecchymosis both arms  Psychiatric: Patient has a normal mood and affect. behavior is normal. Judgment and thought content normal.   Recent Results (from the past 2160 hour(s))  POCT HgB A1C     Status: Abnormal   Collection Time: 03/16/21 11:14 AM  Result Value Ref Range   Hemoglobin A1C 7.3 (A) 4.0 - 5.6 %   HbA1c POC (<> result, manual entry)     HbA1c, POC (  prediabetic range)     HbA1c, POC (controlled diabetic range)       PHQ2/9: Depression screen [facility] 2/9 03/16/2021 12/02/2020 09/15/2020 03/29/2020 11/20/2019  Decreased Interest 0 0 0 0 0  Down, Depressed, Hopeless 0 0 0 0 0  PHQ - 2 Score 0 0 0 0 0  Altered sleeping - - - 0 -  Tired, decreased energy - - - 0 -  Change in appetite - - - 0 -  Feeling bad or failure about yourself  - - - 0 -  Trouble concentrating - - - 0 -  Moving slowly or fidgety/restless - - - 0 -  Suicidal thoughts - - - 0 -  PHQ-9 Score - - - 0 -  Difficult doing work/chores - - - - -  Some recent data might be hidden    phq 9 is negative   Fall Risk: Fall Risk  03/16/2021 12/02/2020 09/15/2020 03/29/2020 11/20/2019  Falls in the past year? 0 0 0 0 0  Number falls in past yr: 0 0 0 0 0  Injury with Fall? 0 0 0 0 0  Risk for fall due to : - No Fall Risks - - No Fall Risks  Follow up - Falls prevention discussed - - Falls prevention discussed     Functional Status Survey: Is the patient deaf or have difficulty hearing?: No Does the patient have difficulty seeing, even when wearing glasses/contacts?: Yes Does the patient have difficulty concentrating, remembering, or making decisions?: No Does the patient have difficulty walking or climbing stairs?: No Does the patient have difficulty dressing or bathing?: No Does the patient have difficulty doing errands alone such as visiting a doctor's office or shopping?: No    Assessment & Plan  1. Type 2 diabetes mellitus with diabetic  polyneuropathy, with long-term current use of insulin (HCC)  - POCT HgB A1C - insulin detemir (LEVEMIR) 100 UNIT/ML injection; Inject 0.06 mLs (6 Units total) into the skin at bedtime.  Dispense: 10 mL; Refill: 0  2. Dyslipidemia  - pravastatin (PRAVACHOL) 20 MG tablet; Take 1 tablet (20 mg total) by mouth daily with supper.  Dispense: 90 tablet; Refill: 1  3. Purpura, nonthrombocytopenic ([place])   4. Paranoid schizophrenia ([place])   5. Vitamin D deficiency  - Vitamin D, Ergocalciferol, (DRISDOL) 1.25 MG (50000 UNIT) CAPS capsule; Take 1 capsule (50,000 Units total) by mouth every 7 (seven) days.  Dispense: 12 capsule; Refill: 1  6. Claudication ([place])   7. Peripheral vascular disease ([place])   8. COPD, moderate ([place])   9. Benign hypertension  - lisinopril-hydrochlorothiazide (ZESTORETIC) 20-12.5 MG tablet; Take 1 tablet by mouth daily.  Dispense: 90 tablet; Refill: 1  10. Chronic bilateral low back pain with sciatica, sciatica laterality unspecified   11. Diabetes mellitus type 2 with peripheral artery disease ([place])   12. Need for shingles vaccine  - Zoster Vaccine Adjuvanted [facility]) injection; Inject 0.5 mLs into the muscle once for 1 dose.  Dispense: 0.5 mL; Refill: 1  13. Irregular heart beat  - EKG 12-Lead  14. New onset a-fib (HCC)  CHA2DS2-VASc Score = 4  The patient's score is based upon: CHF History: No HTN History: Yes Diabetes History: Yes Stroke History: No Vascular Disease History: Yes Age Score: 1 Gender Score: 0

## 2021-03-16 ENCOUNTER — Other Ambulatory Visit: Payer: Self-pay

## 2021-03-16 ENCOUNTER — Encounter: Payer: Self-pay | Admitting: Family Medicine

## 2021-03-16 ENCOUNTER — Ambulatory Visit (INDEPENDENT_AMBULATORY_CARE_PROVIDER_SITE_OTHER): Payer: PPO | Admitting: Family Medicine

## 2021-03-16 ENCOUNTER — Telehealth: Payer: Self-pay | Admitting: Family Medicine

## 2021-03-16 VITALS — BP 122/70 | HR 87 | Temp 98.1°F | Resp 16 | Ht 71.0 in | Wt 165.0 lb

## 2021-03-16 DIAGNOSIS — I499 Cardiac arrhythmia, unspecified: Secondary | ICD-10-CM | POA: Diagnosis not present

## 2021-03-16 DIAGNOSIS — M5440 Lumbago with sciatica, unspecified side: Secondary | ICD-10-CM

## 2021-03-16 DIAGNOSIS — Z794 Long term (current) use of insulin: Secondary | ICD-10-CM | POA: Diagnosis not present

## 2021-03-16 DIAGNOSIS — D692 Other nonthrombocytopenic purpura: Secondary | ICD-10-CM | POA: Diagnosis not present

## 2021-03-16 DIAGNOSIS — E1151 Type 2 diabetes mellitus with diabetic peripheral angiopathy without gangrene: Secondary | ICD-10-CM | POA: Diagnosis not present

## 2021-03-16 DIAGNOSIS — E785 Hyperlipidemia, unspecified: Secondary | ICD-10-CM

## 2021-03-16 DIAGNOSIS — F200 Paranoid schizophrenia: Secondary | ICD-10-CM | POA: Diagnosis not present

## 2021-03-16 DIAGNOSIS — Z23 Encounter for immunization: Secondary | ICD-10-CM | POA: Diagnosis not present

## 2021-03-16 DIAGNOSIS — I739 Peripheral vascular disease, unspecified: Secondary | ICD-10-CM | POA: Diagnosis not present

## 2021-03-16 DIAGNOSIS — J449 Chronic obstructive pulmonary disease, unspecified: Secondary | ICD-10-CM | POA: Diagnosis not present

## 2021-03-16 DIAGNOSIS — E1142 Type 2 diabetes mellitus with diabetic polyneuropathy: Secondary | ICD-10-CM | POA: Diagnosis not present

## 2021-03-16 DIAGNOSIS — G8929 Other chronic pain: Secondary | ICD-10-CM

## 2021-03-16 DIAGNOSIS — E559 Vitamin D deficiency, unspecified: Secondary | ICD-10-CM

## 2021-03-16 DIAGNOSIS — I4891 Unspecified atrial fibrillation: Secondary | ICD-10-CM

## 2021-03-16 DIAGNOSIS — I10 Essential (primary) hypertension: Secondary | ICD-10-CM

## 2021-03-16 LAB — POCT GLYCOSYLATED HEMOGLOBIN (HGB A1C): Hemoglobin A1C: 7.3 % — AB (ref 4.0–5.6)

## 2021-03-16 MED ORDER — LISINOPRIL-HYDROCHLOROTHIAZIDE 20-12.5 MG PO TABS: 1.0000 | ORAL_TABLET | Freq: Every day | ORAL | 1 refills | Status: DC

## 2021-03-16 MED ORDER — SHINGRIX 50 MCG/0.5ML IM SUSR
0.5000 mL | Freq: Once | INTRAMUSCULAR | 1 refills | Status: AC
Start: 2021-03-16 — End: 2021-03-16

## 2021-03-16 MED ORDER — PRAVASTATIN SODIUM 20 MG PO TABS: 20.0000 mg | ORAL_TABLET | Freq: Every day | ORAL | 1 refills | Status: DC

## 2021-03-16 MED ORDER — VITAMIN D (ERGOCALCIFEROL) 1.25 MG (50000 UNIT) PO CAPS: 50000.0000 [IU] | ORAL_CAPSULE | ORAL | 1 refills | Status: DC

## 2021-03-16 MED ORDER — INSULIN DETEMIR 100 UNIT/ML ~~LOC~~ SOLN: 6.0000 [IU] | Freq: Every day | SUBCUTANEOUS | 0 refills | Status: DC

## 2021-03-16 MED ORDER — TRELEGY ELLIPTA 100-62.5-25 MCG/INH IN AEPB: 1.0000 | INHALATION_SPRAY | Freq: Every evening | RESPIRATORY_TRACT | 5 refills | Status: DC

## 2021-03-16 MED ORDER — RIVAROXABAN 20 MG PO TABS: 20.0000 mg | ORAL_TABLET | Freq: Every day | ORAL | 5 refills | Status: DC

## 2021-03-16 NOTE — Telephone Encounter (Signed)
[place] Phone:  [phone]  Fax:  [phone] calling stating there is a drug interaction between rivaroxaban (XARELTO) 20 MG TABS tablet and meloxicam (MOBIC) 7.5 MG tablet and would like a follow up call

## 2021-03-16 NOTE — Telephone Encounter (Signed)
Tried Art therapist. Line just kept ringing with no answer. Unable to leave vm.

## 2021-04-06 ENCOUNTER — Other Ambulatory Visit: Payer: Self-pay | Admitting: Family Medicine

## 2021-04-06 DIAGNOSIS — E785 Hyperlipidemia, unspecified: Secondary | ICD-10-CM

## 2021-04-06 NOTE — Telephone Encounter (Signed)
Future visit in 3 months  

## 2021-04-18 DIAGNOSIS — Z720 Tobacco use: Secondary | ICD-10-CM | POA: Diagnosis not present

## 2021-04-18 DIAGNOSIS — M5416 Radiculopathy, lumbar region: Secondary | ICD-10-CM | POA: Diagnosis not present

## 2021-04-18 DIAGNOSIS — F1120 Opioid dependence, uncomplicated: Secondary | ICD-10-CM | POA: Diagnosis not present

## 2021-04-18 DIAGNOSIS — G894 Chronic pain syndrome: Secondary | ICD-10-CM | POA: Diagnosis not present

## 2021-04-18 DIAGNOSIS — M5430 Sciatica, unspecified side: Secondary | ICD-10-CM | POA: Diagnosis not present

## 2021-04-18 DIAGNOSIS — M961 Postlaminectomy syndrome, not elsewhere classified: Secondary | ICD-10-CM | POA: Diagnosis not present

## 2021-04-18 DIAGNOSIS — M545 Low back pain, unspecified: Secondary | ICD-10-CM | POA: Diagnosis not present

## 2021-04-18 DIAGNOSIS — E119 Type 2 diabetes mellitus without complications: Secondary | ICD-10-CM | POA: Diagnosis not present

## 2021-04-18 DIAGNOSIS — I10 Essential (primary) hypertension: Secondary | ICD-10-CM | POA: Diagnosis not present

## 2021-04-18 DIAGNOSIS — F209 Schizophrenia, unspecified: Secondary | ICD-10-CM | POA: Diagnosis not present

## 2021-04-18 DIAGNOSIS — G8929 Other chronic pain: Secondary | ICD-10-CM | POA: Diagnosis not present

## 2021-04-18 DIAGNOSIS — Z716 Tobacco abuse counseling: Secondary | ICD-10-CM | POA: Diagnosis not present

## 2021-04-19 ENCOUNTER — Inpatient Hospital Stay
Admission: EM | Admit: 2021-04-19 | Discharge: 2021-04-22 | DRG: 308 | Disposition: A | Discharge status: Home / Self Care | Payer: PPO | Attending: Internal Medicine | Admitting: Internal Medicine

## 2021-04-19 ENCOUNTER — Encounter: Payer: Self-pay | Admitting: Emergency Medicine

## 2021-04-19 ENCOUNTER — Other Ambulatory Visit: Payer: Self-pay

## 2021-04-19 ENCOUNTER — Emergency Department: Payer: PPO

## 2021-04-19 DIAGNOSIS — Z791 Long term (current) use of non-steroidal anti-inflammatories (NSAID): Secondary | ICD-10-CM

## 2021-04-19 DIAGNOSIS — G2581 Restless legs syndrome: Secondary | ICD-10-CM | POA: Diagnosis present

## 2021-04-19 DIAGNOSIS — I480 Paroxysmal atrial fibrillation: Secondary | ICD-10-CM | POA: Diagnosis not present

## 2021-04-19 DIAGNOSIS — R531 Weakness: Secondary | ICD-10-CM | POA: Diagnosis not present

## 2021-04-19 DIAGNOSIS — I4891 Unspecified atrial fibrillation: Secondary | ICD-10-CM | POA: Diagnosis not present

## 2021-04-19 DIAGNOSIS — Z833 Family history of diabetes mellitus: Secondary | ICD-10-CM | POA: Diagnosis not present

## 2021-04-19 DIAGNOSIS — Z886 Allergy status to analgesic agent status: Secondary | ICD-10-CM | POA: Diagnosis not present

## 2021-04-19 DIAGNOSIS — E559 Vitamin D deficiency, unspecified: Secondary | ICD-10-CM | POA: Diagnosis present

## 2021-04-19 DIAGNOSIS — Z79899 Other long term (current) drug therapy: Secondary | ICD-10-CM

## 2021-04-19 DIAGNOSIS — Z794 Long term (current) use of insulin: Secondary | ICD-10-CM | POA: Diagnosis not present

## 2021-04-19 DIAGNOSIS — F17210 Nicotine dependence, cigarettes, uncomplicated: Secondary | ICD-10-CM | POA: Diagnosis not present

## 2021-04-19 DIAGNOSIS — N179 Acute kidney failure, unspecified: Secondary | ICD-10-CM

## 2021-04-19 DIAGNOSIS — E1142 Type 2 diabetes mellitus with diabetic polyneuropathy: Secondary | ICD-10-CM | POA: Diagnosis present

## 2021-04-19 DIAGNOSIS — Z8249 Family history of ischemic heart disease and other diseases of the circulatory system: Secondary | ICD-10-CM

## 2021-04-19 DIAGNOSIS — Z87442 Personal history of urinary calculi: Secondary | ICD-10-CM

## 2021-04-19 DIAGNOSIS — M150 Primary generalized (osteo)arthritis: Secondary | ICD-10-CM | POA: Diagnosis present

## 2021-04-19 DIAGNOSIS — R0602 Shortness of breath: Secondary | ICD-10-CM | POA: Diagnosis not present

## 2021-04-19 DIAGNOSIS — Z880 Allergy status to penicillin: Secondary | ICD-10-CM | POA: Diagnosis not present

## 2021-04-19 DIAGNOSIS — E1151 Type 2 diabetes mellitus with diabetic peripheral angiopathy without gangrene: Secondary | ICD-10-CM | POA: Diagnosis present

## 2021-04-19 DIAGNOSIS — J449 Chronic obstructive pulmonary disease, unspecified: Secondary | ICD-10-CM | POA: Diagnosis present

## 2021-04-19 DIAGNOSIS — I10 Essential (primary) hypertension: Secondary | ICD-10-CM | POA: Diagnosis not present

## 2021-04-19 DIAGNOSIS — R110 Nausea: Secondary | ICD-10-CM | POA: Diagnosis not present

## 2021-04-19 DIAGNOSIS — E785 Hyperlipidemia, unspecified: Secondary | ICD-10-CM | POA: Diagnosis not present

## 2021-04-19 DIAGNOSIS — U071 COVID-19: Secondary | ICD-10-CM | POA: Diagnosis present

## 2021-04-19 DIAGNOSIS — L8990 Pressure ulcer of unspecified site, unspecified stage: Secondary | ICD-10-CM | POA: Insufficient documentation

## 2021-04-19 DIAGNOSIS — Z86010 Personal history of colonic polyps: Secondary | ICD-10-CM | POA: Diagnosis not present

## 2021-04-19 LAB — MAGNESIUM: Magnesium: 2 mg/dL (ref 1.7–2.4)

## 2021-04-19 LAB — CBC
HCT: 55.2 % — ABNORMAL HIGH (ref 39.0–52.0)
Hemoglobin: 19.5 g/dL — ABNORMAL HIGH (ref 13.0–17.0)
MCH: 31.5 pg (ref 26.0–34.0)
MCHC: 35.3 g/dL (ref 30.0–36.0)
MCV: 89 fL (ref 80.0–100.0)
Platelets: 159 10*3/uL (ref 150–400)
RBC: 6.2 MIL/uL — ABNORMAL HIGH (ref 4.22–5.81)
RDW: 12.6 % (ref 11.5–15.5)
WBC: 7.2 10*3/uL (ref 4.0–10.5)
nRBC: 0 % (ref 0.0–0.2)

## 2021-04-19 LAB — TSH: TSH: 1.706 u[IU]/mL (ref 0.350–4.500)

## 2021-04-19 LAB — TROPONIN I (HIGH SENSITIVITY): Troponin I (High Sensitivity): 14 ng/L (ref <18)

## 2021-04-19 LAB — BASIC METABOLIC PANEL
Anion gap: 20 — ABNORMAL HIGH (ref 5–15)
BUN: 26 mg/dL — ABNORMAL HIGH (ref 8–23)
CO2: 22 mmol/L (ref 22–32)
Calcium: 9.6 mg/dL (ref 8.9–10.3)
Chloride: 93 mmol/L — ABNORMAL LOW (ref 98–111)
Creatinine, Ser: 1.34 mg/dL — ABNORMAL HIGH (ref 0.61–1.24)
GFR, Estimated: 58 mL/min — ABNORMAL LOW (ref >60)
Glucose, Bld: 132 mg/dL — ABNORMAL HIGH (ref 70–99)
Potassium: 4.5 mmol/L (ref 3.5–5.1)
Sodium: 135 mmol/L (ref 135–145)

## 2021-04-19 LAB — CK: Total CK: 175 U/L (ref 49–397)

## 2021-04-19 MED ORDER — ACETAMINOPHEN 325 MG PO TABS: 650.0000 mg | ORAL_TABLET | Freq: Four times a day (QID) | ORAL | Status: DC | PRN

## 2021-04-19 MED ORDER — ONDANSETRON HCL 4 MG/2ML IJ SOLN: 4.0000 mg | Freq: Four times a day (QID) | INTRAMUSCULAR | Status: DC | PRN

## 2021-04-19 MED ORDER — ONDANSETRON HCL 4 MG PO TABS: 4.0000 mg | ORAL_TABLET | Freq: Four times a day (QID) | ORAL | Status: DC | PRN

## 2021-04-19 MED ORDER — DILTIAZEM HCL-DEXTROSE 125-5 MG/125ML-% IV SOLN (PREMIX)
5.0000 mg/h | INTRAVENOUS | Status: DC
  Administered 2021-04-19 – 2021-04-20 (×2): 5 mg/h via INTRAVENOUS
  Filled 2021-04-19 (×2): qty 125

## 2021-04-19 MED ORDER — LACTATED RINGERS IV BOLUS
1000.0000 mL | Freq: Once | INTRAVENOUS | Status: AC
  Administered 2021-04-20: 1000 mL via INTRAVENOUS

## 2021-04-19 MED ORDER — ONDANSETRON HCL 4 MG/2ML IJ SOLN
4.0000 mg | Freq: Once | INTRAMUSCULAR | Status: AC
  Administered 2021-04-19: 4 mg via INTRAVENOUS
  Filled 2021-04-19: qty 2

## 2021-04-19 MED ORDER — ACETAMINOPHEN 650 MG RE SUPP: 650.0000 mg | Freq: Four times a day (QID) | RECTAL | Status: DC | PRN

## 2021-04-19 MED ORDER — DILTIAZEM LOAD VIA INFUSION
10.0000 mg | Freq: Once | INTRAVENOUS | Status: AC
  Administered 2021-04-19: 10 mg via INTRAVENOUS
  Filled 2021-04-19: qty 10

## 2021-04-19 MED ORDER — ENOXAPARIN SODIUM 40 MG/0.4ML IJ SOSY
40.0000 mg | PREFILLED_SYRINGE | INTRAMUSCULAR | Status: DC
  Administered 2021-04-20: 40 mg via SUBCUTANEOUS
  Filled 2021-04-19: qty 0.4

## 2021-04-19 MED ORDER — INSULIN ASPART 100 UNIT/ML IJ SOLN
0.0000 [IU] | Freq: Three times a day (TID) | INTRAMUSCULAR | Status: DC
  Administered 2021-04-20 – 2021-04-21 (×2): 2 [IU] via SUBCUTANEOUS
  Administered 2021-04-22: 1 [IU] via SUBCUTANEOUS
  Filled 2021-04-19 (×3): qty 1

## 2021-04-19 MED ORDER — INSULIN ASPART 100 UNIT/ML IJ SOLN
0.0000 [IU] | Freq: Every day | INTRAMUSCULAR | Status: DC
  Filled 2021-04-19: qty 1

## 2021-04-19 MED ORDER — SODIUM CHLORIDE 0.9 % IV BOLUS
1000.0000 mL | Freq: Once | INTRAVENOUS | Status: AC
  Administered 2021-04-19: 1000 mL via INTRAVENOUS

## 2021-04-19 NOTE — ED Triage Notes (Addendum)
Pt arrived via POV with reports of receiving a COVID vaccine and Shingles vaccine on Thursday, states on Saturday he started having poor appetite, nausea, HA, runny nose and fatigue.  Pt c/o feeling weak and nauseous.  Pt reports he feels like he has been getting some muscle cramps as well. Pt reports he feels out of breath when exerting himself.

## 2021-04-19 NOTE — ED Provider Notes (Signed)
[facility] Emergency Department Provider Note  ____________________________________________   Event Date/Time   First MD Initiated Contact with Patient 04/19/21 2256     (approximate)  I have reviewed the triage vital signs and the nursing notes.   HISTORY  Chief Complaint Nausea and Weakness    HPI Mark Nash is a 65 y.o. male with history of COPD, hypertension, hyperlipidemia, peripheral vascular disease, diabetes, schizophrenia who presents to the emergency department with feeling poorly since Saturday, August 20.  States that he received his third COVID-19 vaccination and a shingles vaccine on Thursday, August 18.  States he started feeling bad 2 days later where he was having nausea and feels like he cannot eat or drink.  No vomiting but states "I have come close".  He also has had bouts of diarrhea.  States now his muscles feel weak and have been cramping.  No chest pain but is having shortness of breath with exertion.  Also reports mild cough.  No fevers or chills.  Patient currently in atrial fibrillation with RVR.  He is unsure if he has a history of this however records document that this was a new finding on his last visit with his PCP on 03/16/2021.  He was prescribed anticoagulation but was told by his pharmacist that this would interact with the Mobic that he was prescribed so he never started taking it.  Chads vasc 2 score is a 4.       Past Medical History:  Diagnosis Date   Arthritis    Asthma    Benign neoplasm of ascending colon    polyps   Constipation    COPD (chronic obstructive pulmonary disease) ([place])    NO INHALER   Diabetes mellitus without complication (HCC)    Type 2   Diverticulosis    Dyspnea    RARE-WITH EXERTION DUE TO COPD   GERD (gastroesophageal reflux disease)    Hepatitis C    History of kidney stones    H/O   Hyperlipidemia    Hypertension 06/10/2018   currently not under control   Left inguinal hernia     Peripheral vascular disease (HCC)    Restless legs    Schizoaffective disorder (HCC)    Pt denies   Thrombocytopenia ([place])    Vitamin D deficiency     Patient Active Problem List   Diagnosis Date Noted   Diabetes mellitus type 2 with peripheral artery disease ([place]) 03/16/2021   DDD (degenerative disc disease), lumbar 03/06/2019   Primary osteoarthritis involving multiple joints 03/06/2019   Lumbar radiculopathy 11/05/2018   Lumbar post-laminectomy syndrome 11/05/2018   Diabetic polyneuropathy associated with type 2 diabetes mellitus ([place]) 11/05/2018   CLE (columnar lined esophagus)    Gastric irritation    Duodenum ulcer    Personal history of colonic polyps    Polyp of sigmoid colon    Diverticulosis of large intestine without diverticulitis    Benign neoplasm of ascending colon    Osteoarthritis of both hands 09/04/2017   Chronic pain of left knee 09/04/2017   Abnormal MRI, knee 09/04/2017   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   Lymphedema 07/07/2016   Chronic hepatitis C without hepatic coma ([place]) 06/01/2016   Lumbar herniated disc 11/24/2015   Thrombocytopenia ([place]) 11/11/2015   Hepatitis C antibody test positive 07/13/2015   Chronic constipation 07/13/2015   Abnormal immunological findings in specimens from other organs, systems and tissues 07/13/2015   CN (constipation) 07/13/2015   Diabetes mellitus with  coincident hypertension ([place]) 03/08/2015   COPD, mild ([place]) 03/08/2015   Dyslipidemia 03/08/2015   Paranoid schizophrenia ([place]) 03/08/2015   Acquired polyneuropathy 03/08/2015   Restless leg 03/08/2015   Tobacco abuse 03/08/2015   Callus of foot 03/08/2015   Claudication ([place]) 03/08/2015   Chronic obstructive pulmonary disease ([place]) 03/08/2015   Corn or callus 03/08/2015   Essential (primary) hypertension 03/08/2015   HLD (hyperlipidemia) 03/08/2015   Peripheral vascular disease ([place]) 03/08/2015   Polyneuropathy 03/08/2015   Current tobacco use  03/08/2015   Vitamin D deficiency 04/26/2009   Avitaminosis D 04/26/2009    Past Surgical History:  Procedure Laterality Date   BACK SURGERY     COLONOSCOPY WITH PROPOFOL N/A 09/07/2017   Procedure: COLONOSCOPY WITH PROPOFOL;  Surgeon: Virgel Manifold, MD;  Location: [facility] ENDOSCOPY;  Service: Endoscopy;  Laterality: N/A;   ESOPHAGOGASTRODUODENOSCOPY (EGD) WITH PROPOFOL N/A 12/07/2017   Procedure: ESOPHAGOGASTRODUODENOSCOPY (EGD) WITH PROPOFOL;  Surgeon: Virgel Manifold, MD;  Location: [facility] END[facility]COPY;  Service: Endoscopy;  Laterality: N/A;   FLEXIBLE SIGMOIDOSCOPY N/A 11/09/2017   Procedure: FLEXIBLE SIGMOIDOSCOPY;  Surgeon: Virgel Manifold, MD;  Location: [facility] END[facility]COPY;  Service: Endoscopy;  Laterality: N/A;   HERNIA REPAIR     inguinal   INGUINAL HERNIA REPAIR Bilateral 06/21/2018   Procedure: LAPAROSCOPIC BILATERAL INGUINAL HERNIA REPAIR;  Surgeon: Vickie Epley, MD;  Location: [facility] ORS;  Service: General;  Laterality: Bilateral;   L3 TO L5 LAMINECTOMY FOR DECOMPRESSION  07/17/2016   [facility]   REPAIR OF CEREBROSPINAL FLUID LEAK N/A 09/08/2016   Procedure: Lumbar wound exploration, repair of pseudomenigocele;  Surgeon: Kevan Ny Ditty, MD;  Location: [facility];  Service: Neurosurgery;  Laterality: N/A;  Lumbar wound exploration, repair of pseudomenigocele   SINUSOTOMY      Prior to Admission medications   Medication Sig Start Date End Date Taking? Authorizing Provider  Fluticasone-Umeclidin-Vilant (TRELEGY ELLIPTA) 100-62.5-25 MCG/INH AEPB Take 1 puff by mouth every evening. 03/16/21   [name], MD  gabapentin (NEURONTIN) 300 MG capsule Take 1-2 capsules by mouth in the morning, at noon, and at bedtime. 04/24/19   Nathanial Rancher, MD  insulin detemir (LEVEMIR) 100 UNIT/ML injection Inject 0.06 mLs (6 Units total) into the skin at bedtime. 03/16/21   [name], MD  lisinopril-hydrochlorothiazide (ZESTORETIC) 20-12.5 MG tablet Take 1  tablet by mouth daily. 03/16/21   [name], MD  meloxicam (MOBIC) 7.5 MG tablet Take 7.5 mg by mouth daily. 02/19/19   Behalal-Bock, Christele, MD  pravastatin (PRAVACHOL) 20 MG tablet TAKE 1 TABLET BY MOUTH ONCE DAILY WITH SUPPER 04/06/21   [name], MD  rivaroxaban (XARELTO) 20 MG TABS tablet Take 1 tablet (20 mg total) by mouth daily with supper. 03/16/21   [name], MD  tiZANidine (ZANAFLEX) 2 MG tablet Take 2 mg by mouth every 12 (twelve) hours.    Allyne Gee, MD  traMADol (ULTRAM) 50 MG tablet Take by mouth every 12 (twelve) hours as needed.    Allyne Gee, MD  Vitamin D, Ergocalciferol, (DRISDOL) 1.25 MG (50000 UNIT) CAPS capsule Take 1 capsule (50,000 Units total) by mouth every 7 (seven) days. 03/16/21   [name], MD    Allergies Penicillins, Aspirin, Ibuprofen, Lyrica [pregabalin], and Acetaminophen  Family History  Problem Relation Age of Onset   Heart failure Mother    Hypertension Mother    Diverticulitis Mother    Colitis Mother    Diabetes Father    Hypertension Father  Heart disease Father    Diabetes Sister    Diverticulitis Brother     Social History Social History   Tobacco Use   Smoking status: Every Day    Packs/day: 1.50    Years: 46.00    Pack years: 69.00    Types: Cigarettes   Smokeless tobacco: Never  Vaping Use   Vaping Use: Never used  Substance Use Topics   Alcohol use: No    Alcohol/week: 0.0 standard drinks   Drug use: No    Review of Systems Constitutional: No fever. Eyes: No visual changes. ENT: No sore throat. Cardiovascular: Denies chest pain. Respiratory: + shortness of breath. Gastrointestinal: + nausea.  No vomiting, diarrhea. Genitourinary: Negative for dysuria. Musculoskeletal: Negative for back pain. Skin: Negative for rash. Neurological: Negative for focal weakness or numbness.  ____________________________________________   PHYSICAL EXAM:  VITAL SIGNS: ED Triage Vitals  Enc Vitals  Group     BP 04/19/21 2200 120/85     Pulse Rate 04/19/21 2200 80     Resp 04/19/21 2200 18     Temp 04/19/21 2200 98 F (36.7 C)     Temp Source 04/19/21 2200 Oral     SpO2 04/19/21 2200 99 %     Weight 04/19/21 2200 165 lb (74.8 kg)     Height 04/19/21 2200 '5\' 11"'$  (1.803 m)     Head Circumference --      Peak Flow --      Pain Score 04/19/21 2213 0     Pain Loc --      Pain Edu? --      Excl. in [place]? --    CONSTITUTIONAL: Alert and oriented and responds appropriately to questions.  Afebrile, in no distress HEAD: Normocephalic EYES: Conjunctivae clear, pupils appear equal, EOM appear intact ENT: normal nose; moist mucous membranes NECK: Supple, normal ROM CARD: Irregularly irregular and tachycardic; S1 and S2 appreciated; no murmurs, no clicks, no rubs, no gallops RESP: Normal chest excursion without splinting or tachypnea; breath sounds clear and equal bilaterally; no wheezes, no rhonchi, no rales, no hypoxia or respiratory distress, speaking full sentences ABD/GI: Normal bowel sounds; non-distended; soft, non-tender, no rebound, no guarding, no peritoneal signs, no hepatosplenomegaly BACK: The back appears normal EXT: Normal ROM in all joints; no deformity noted, no edema; no cyanosis, no calf tenderness or calf swelling SKIN: Normal color for age and race; warm; no rash on exposed skin NEURO: Moves all extremities equally PSYCH: The patient's mood and manner are appropriate.  ____________________________________________   LABS (all labs ordered are listed, but only abnormal results are displayed)  Labs Reviewed  BASIC METABOLIC PANEL - Abnormal; Notable for the following components:      Result Value   Chloride 93 (*)    Glucose, Bld 132 (*)    BUN 26 (*)    Creatinine, Ser 1.34 (*)    GFR, Estimated 58 (*)    Anion gap 20 (*)    All other components within normal limits  CBC - Abnormal; Notable for the following components:   RBC 6.20 (*)    Hemoglobin 19.5 (*)     HCT 55.2 (*)    All other components within normal limits  RESP PANEL BY RT-PCR (FLU A&B, COVID) ARPGX2  URINALYSIS, COMPLETE (UACMP) WITH MICROSCOPIC  MAGNESIUM  CK  TSH  URINE DRUG SCREEN, QUALITATIVE ([facility] ONLY)  TROPONIN I (HIGH SENSITIVITY)   ____________________________________________  EKG   EKG Interpretation  Date/Time:  Tuesday April 19 2021 22:24:17 EDT Ventricular Rate:  145 PR Interval:    QRS Duration: 82 QT Interval:  300 QTC Calculation: 466 R Axis:   82 Text Interpretation: Atrial fibrillation with rapid ventricular response with premature ventricular or aberrantly conducted complexes Nonspecific ST abnormality Abnormal ECG Confirmed by Pryor Curia [phone]) on 04/19/2021 10:57:27 PM        ____________________________________________  RADIOLOGY Jessie Foot Amiya Escamilla, personally viewed and evaluated these images (plain radiographs) as part of my medical decision making, as well as reviewing the written report by the radiologist.  ED MD interpretation: Chest x-ray shows findings consistent with COPD but no other acute abnormality noted.  Official radiology report(s): DG Chest 2 View  Result Date: 04/19/2021 CLINICAL DATA:  Shortness of breath.  Weakness and nausea. EXAM: CHEST - 2 VIEW COMPARISON:  Remote radiograph 12/11/2007 FINDINGS: Chronic hyperinflation. Minimal bronchial thickening. No focal airspace disease. Normal heart size and mediastinal contours. Aortic atherosclerosis. Minimal biapical pleuroparenchymal scarring. No pleural effusion. No pneumothorax. No pulmonary edema. Remote left rib fractures. Slight anterior wedging of midthoracic vertebra, age indeterminate. IMPRESSION: Chronic hyperinflation and bronchial thickening, imaging findings consistent with COPD. No focal airspace disease. Electronically Signed   By: Keith Rake M.D.   On: 04/19/2021 22:33    ____________________________________________   PROCEDURES  Procedure(s) performed  (including Critical Care):  Procedures  CRITICAL CARE Performed by: Cyril Mourning Johanna Stafford   Total critical care time: 45 minutes  Critical care time was exclusive of separately billable procedures and treating other patients.  Critical care was necessary to treat or prevent imminent or life-threatening deterioration.  Critical care was time spent personally by me on the following activities: development of treatment plan with patient and/or surrogate as well as nursing, discussions with consultants, evaluation of patient's response to treatment, examination of patient, obtaining history from patient or surrogate, ordering and performing treatments and interventions, ordering and review of laboratory studies, ordering and review of radiographic studies, pulse oximetry and re-evaluation of patient's condition.  ____________________________________________   INITIAL IMPRESSION / ASSESSMENT AND PLAN / ED COURSE  As part of my medical decision making, I reviewed the following data within the [facility] notes reviewed and incorporated, Labs reviewed , EKG interpreted , Old EKG reviewed, Radiograph reviewed , Discussed with admitting physician , and Notes from prior ED visits         Patient here with complaints of feeling poorly.  Having shortness of breath worse with exertion, nausea, muscle cramps.  He thought this could have been from his COVID-19 booster and shingles vaccine.  Looks like he is in A. fib with RVR which was documented as a new finding in July 2020.  I suspect this is why he Is feeling poorly today.  He is not having any chest pain but does describe shortness of breath with exertion.  Troponin negative.  Chest x-ray clear.  Labs look hemoconcentrated.  He has some mild AKI and reports decreased p.o. intake.  Suspect that he is dehydrated as well.  Will obtain TSH and check electrolytes, CK given muscle cramps.  Will give IV fluids and start diltiazem.  Will  discuss with hospitalist for admission.  Patient not a candidate currently for cardioversion in the ED given he has not been taking his anticoagulation.  ED PROGRESS  Magnesium normal.  TSH normal.  CK normal.   11:51 PM Discussed patient's case with hospitalist, Dr. Damita Dunnings.  I have recommended admission and patient (and family if present) agree with this  plan. Admitting physician will place admission orders.   I reviewed all nursing notes, vitals, pertinent previous records and reviewed/interpreted all EKGs, lab and urine results, imaging (as available).  ____________________________________________   FINAL CLINICAL IMPRESSION(S) / ED DIAGNOSES  Final diagnoses:  Atrial fibrillation with RVR [facility])     ED Discharge Orders     None       *Please note:  Mark Nash was evaluated in Emergency Department on 04/19/2021 for the symptoms described in the history of present illness. He was evaluated in the context of the global COVID-19 pandemic, which necessitated consideration that the patient might be at risk for infection with the SARS-CoV-2 virus that causes COVID-19. Institutional protocols and algorithms that pertain to the evaluation of patients at risk for COVID-19 are in a state of rapid change based on information released by regulatory bodies including the CDC and federal and state organizations. These policies and algorithms were followed during the patient's care in the ED.  Some ED evaluations and interventions may be delayed as a result of limited staffing during and the pandemic.*   Note:  This document was prepared using Dragon voice recognition software and may include unintentional dictation errors.    Sidonia Nutter, Delice Bison, DO 04/19/21 2351

## 2021-04-20 ENCOUNTER — Inpatient Hospital Stay
Admit: 2021-04-20 | Discharge: 2021-04-20 | Disposition: A | Discharge status: Home / Self Care | Payer: PPO | Attending: Internal Medicine | Admitting: Internal Medicine

## 2021-04-20 DIAGNOSIS — I4891 Unspecified atrial fibrillation: Secondary | ICD-10-CM | POA: Diagnosis not present

## 2021-04-20 DIAGNOSIS — N179 Acute kidney failure, unspecified: Secondary | ICD-10-CM

## 2021-04-20 LAB — HEPARIN LEVEL (UNFRACTIONATED): Heparin Unfractionated: 0.93 IU/mL — ABNORMAL HIGH (ref 0.30–0.70)

## 2021-04-20 LAB — URINE DRUG SCREEN, QUALITATIVE (ARMC ONLY)
Amphetamines, Ur Screen: NOT DETECTED
Barbiturates, Ur Screen: NOT DETECTED
Benzodiazepine, Ur Scrn: NOT DETECTED
Cannabinoid 50 Ng, Ur ~~LOC~~: POSITIVE — AB
Cocaine Metabolite,Ur ~~LOC~~: NOT DETECTED
MDMA (Ecstasy)Ur Screen: NOT DETECTED
Methadone Scn, Ur: NOT DETECTED
Opiate, Ur Screen: NOT DETECTED
Phencyclidine (PCP) Ur S: NOT DETECTED
Tricyclic, Ur Screen: NOT DETECTED

## 2021-04-20 LAB — URINALYSIS, COMPLETE (UACMP) WITH MICROSCOPIC
Bacteria, UA: NONE SEEN
Bilirubin Urine: NEGATIVE
Glucose, UA: NEGATIVE mg/dL
Ketones, ur: 80 mg/dL — AB
Leukocytes,Ua: NEGATIVE
Nitrite: NEGATIVE
Protein, ur: 30 mg/dL — AB
Specific Gravity, Urine: 1.019 (ref 1.005–1.030)
pH: 6 (ref 5.0–8.0)

## 2021-04-20 LAB — RESP PANEL BY RT-PCR (FLU A&B, COVID) ARPGX2
Influenza A by PCR: NEGATIVE
Influenza B by PCR: NEGATIVE
SARS Coronavirus 2 by RT PCR: POSITIVE — AB

## 2021-04-20 LAB — TROPONIN I (HIGH SENSITIVITY): Troponin I (High Sensitivity): 11 ng/L (ref <18)

## 2021-04-20 LAB — ECHOCARDIOGRAM COMPLETE
Height: 71 in
S' Lateral: 2.8 cm
Weight: 2640 oz

## 2021-04-20 LAB — HIV ANTIBODY (ROUTINE TESTING W REFLEX): HIV Screen 4th Generation wRfx: NONREACTIVE

## 2021-04-20 LAB — CBG MONITORING, ED
Glucose-Capillary: 164 mg/dL — ABNORMAL HIGH (ref 70–99)
Glucose-Capillary: 116 mg/dL — ABNORMAL HIGH (ref 70–99)

## 2021-04-20 LAB — GLUCOSE, CAPILLARY
Glucose-Capillary: 137 mg/dL — ABNORMAL HIGH (ref 70–99)
Glucose-Capillary: 185 mg/dL — ABNORMAL HIGH (ref 70–99)

## 2021-04-20 LAB — PROTIME-INR
INR: 1 (ref 0.8–1.2)
Prothrombin Time: 13.4 seconds (ref 11.4–15.2)

## 2021-04-20 LAB — APTT: aPTT: 28 seconds (ref 24–36)

## 2021-04-20 MED ORDER — SODIUM CHLORIDE 0.9 % IV SOLN
100.0000 mg | Freq: Every day | INTRAVENOUS | Status: DC
  Administered 2021-04-21 – 2021-04-22 (×2): 100 mg via INTRAVENOUS
  Filled 2021-04-20 (×2): qty 20

## 2021-04-20 MED ORDER — ALBUTEROL SULFATE HFA 108 (90 BASE) MCG/ACT IN AERS
2.0000 | INHALATION_SPRAY | Freq: Four times a day (QID) | RESPIRATORY_TRACT | Status: DC
  Administered 2021-04-20 – 2021-04-22 (×9): 2 via RESPIRATORY_TRACT
  Filled 2021-04-20: qty 6.7

## 2021-04-20 MED ORDER — HEPARIN (PORCINE) 25000 UT/250ML-% IV SOLN
950.0000 [IU]/h | INTRAVENOUS | Status: DC
  Administered 2021-04-20: 1100 [IU]/h via INTRAVENOUS
  Administered 2021-04-21: 950 [IU]/h via INTRAVENOUS
  Filled 2021-04-20 (×2): qty 250

## 2021-04-20 MED ORDER — ASCORBIC ACID 500 MG PO TABS
500.0000 mg | ORAL_TABLET | Freq: Every day | ORAL | Status: DC
  Administered 2021-04-20 – 2021-04-22 (×3): 500 mg via ORAL
  Filled 2021-04-20 (×4): qty 1

## 2021-04-20 MED ORDER — SODIUM CHLORIDE 0.9 % IV SOLN
200.0000 mg | Freq: Once | INTRAVENOUS | Status: AC
  Administered 2021-04-20: 200 mg via INTRAVENOUS
  Filled 2021-04-20: qty 200

## 2021-04-20 MED ORDER — HEPARIN BOLUS VIA INFUSION
4000.0000 [IU] | Freq: Once | INTRAVENOUS | Status: AC
  Administered 2021-04-20: 4000 [IU] via INTRAVENOUS
  Filled 2021-04-20: qty 4000

## 2021-04-20 MED ORDER — HYDROCOD POLST-CPM POLST ER 10-8 MG/5ML PO SUER
5.0000 mL | Freq: Two times a day (BID) | ORAL | Status: DC | PRN
  Administered 2021-04-20 – 2021-04-21 (×3): 5 mL via ORAL
  Filled 2021-04-20 (×3): qty 5

## 2021-04-20 MED ORDER — DILTIAZEM HCL 30 MG PO TABS
30.0000 mg | ORAL_TABLET | Freq: Four times a day (QID) | ORAL | Status: DC
  Administered 2021-04-20 – 2021-04-21 (×4): 30 mg via ORAL
  Filled 2021-04-20 (×4): qty 1

## 2021-04-20 MED ORDER — GUAIFENESIN-DM 100-10 MG/5ML PO SYRP
10.0000 mL | ORAL_SOLUTION | ORAL | Status: DC | PRN
  Administered 2021-04-20 – 2021-04-22 (×3): 10 mL via ORAL
  Filled 2021-04-20 (×3): qty 10

## 2021-04-20 MED ORDER — ZINC SULFATE 220 (50 ZN) MG PO CAPS
220.0000 mg | ORAL_CAPSULE | Freq: Every day | ORAL | Status: DC
  Administered 2021-04-20 – 2021-04-22 (×3): 220 mg via ORAL
  Filled 2021-04-20 (×3): qty 1

## 2021-04-20 NOTE — H&P (Signed)
History and Physical    Mark Nash [id number] DOB: [birth year] DOA: 04/19/2021  PCP: [name], MD   Patient coming from: home  I have personally briefly reviewed patient's old medical records in [place]  Chief Complaint: fatigue, cough, runny nose, muscle aches x3 days  HPI: Mark Nash is a 65 y.o. male with medical history significant for COPD, HTN, DM, PAD and schizophrenia/schizoaffective disorder who presents to the ED with a complaint of 3-day complaint of nausea, decreased oral intake and muscle cramps, as well as headache nasal congestion and fatigue.  He has mild shortness of breath.  Denies chest pain, fever or chills, denies leg pain or swelling.  Denies vomiting, abdominal pain or diarrhea.  He feels like his symptoms started after receiving the COVID and shingles vaccine on 8/20.  Chart review reveals that patient was seen by his PCP on 7/20 and diagnosed with new onset A. fib and was started on Xarelto for CHA2DS2-VASc of 4, but states he did not start the medication as the pharmacist told him that it would interact with the Mobic he was on.  Patient denies headache, lightheadedness or one-sided numbness tingling or weakness.  ED course: On arrival afebrile, BP 120/85 with pulse 80 and O2 sat 99% on room air Blood work significant for creatinine of 1.34, up from baseline of 0.68.  Hemoglobin 19.5 with normal baseline of 14-16 Potassium normal at 4.5, magnesium 2.0, CK1 75, TSH 1.7 Addendum:COVID resulted positive following admission  EKG: Personally viewed and interpreted, A. fib at 145  Chest x-ray: Chronic hyperinflation and bronchial thickening consistent with COPD.  No focal airspace disease  Patient was given a diltiazem bolus subsequently started on diltiazem infusion.  Hospitalist consulted for admission.  Review of Systems: As per HPI otherwise all other systems on review of systems negative.    Past Medical History:  Diagnosis Date    Arthritis    Asthma    Benign neoplasm of ascending colon    polyps   Constipation    COPD (chronic obstructive pulmonary disease) (HCC)    NO INHALER   Diabetes mellitus without complication (HCC)    Type 2   Diverticulosis    Dyspnea    RARE-WITH EXERTION DUE TO COPD   GERD (gastroesophageal reflux disease)    Hepatitis C    History of kidney stones    H/O   Hyperlipidemia    Hypertension 06/10/2018   currently not under control   Left inguinal hernia    Peripheral vascular disease (HCC)    Restless legs    Schizoaffective disorder (HCC)    Pt denies   Thrombocytopenia ([place])    Vitamin D deficiency     Past Surgical History:  Procedure Laterality Date   BACK SURGERY     COLONOSCOPY WITH PROPOFOL N/A 09/07/2017   Procedure: COLONOSCOPY WITH PROPOFOL;  Surgeon: Virgel Manifold, MD;  Location: [facility] ENDOSCOPY;  Service: Endoscopy;  Laterality: N/A;   ESOPHAGOGASTRODUODENOSCOPY (EGD) WITH PROPOFOL N/A 12/07/2017   Procedure: ESOPHAGOGASTRODUODENOSCOPY (EGD) WITH PROPOFOL;  Surgeon: Virgel Manifold, MD;  Location: [facility] ENDOSCOPY;  Service: Endoscopy;  Laterality: N/A;   FLEXIBLE SIGMOIDOSCOPY N/A 11/09/2017   Procedure: FLEXIBLE SIGMOIDOSCOPY;  Surgeon: Virgel Manifold, MD;  Location: [facility] ENDOSCOPY;  Service: Endoscopy;  Laterality: N/A;   HERNIA REPAIR     inguinal   INGUINAL HERNIA REPAIR Bilateral 06/21/2018   Procedure: LAPAROSCOPIC BILATERAL INGUINAL HERNIA REPAIR;  Surgeon: Vickie Epley, MD;  Location: [facility]  ORS;  Service: General;  Laterality: Bilateral;   L3 TO L5 LAMINECTOMY FOR DECOMPRESSION  07/17/2016    NEUROSURGERY AND SPINE   REPAIR OF CEREBROSPINAL FLUID LEAK N/A 09/08/2016   Procedure: Lumbar wound exploration, repair of pseudomenigocele;  Surgeon: Kevan Ny Ditty, MD;  Location: [facility];  Service: Neurosurgery;  Laterality: N/A;  Lumbar wound exploration, repair of pseudomenigocele   SINUSOTOMY       reports that he has been  smoking cigarettes. He has a 69.00 pack-year smoking history. He has never used smokeless tobacco. He reports that he does not drink alcohol and does not use drugs.  Allergies  Allergen Reactions   Penicillins Hives and Swelling    As a child. Has patient had a PCN reaction causing immediate rash, facial/tongue/throat swelling, SOB or lightheadedness with hypotension: YES  Has patient had a PCN reaction causing severe rash involving mucus membranes or skin necrosis: UNKNOWN Has patient had a PCN reaction that required hospitalization: UNKNOWN Has patient had a PCN reaction occurring within the last 10 years: NO   Aspirin Nausea Only   Ibuprofen Nausea Only   Lyrica [Pregabalin] Nausea Only   Acetaminophen Nausea Only    Family History  Problem Relation Age of Onset   Heart failure Mother    Hypertension Mother    Diverticulitis Mother    Colitis Mother    Diabetes Father    Hypertension Father    Heart disease Father    Diabetes Sister    Diverticulitis Brother       Prior to Admission medications   Medication Sig Start Date End Date Taking? Authorizing Provider  Fluticasone-Umeclidin-Vilant (TRELEGY ELLIPTA) 100-62.5-25 MCG/INH AEPB Take 1 puff by mouth every evening. 03/16/21   [name], MD  gabapentin (NEURONTIN) 300 MG capsule Take 1-2 capsules by mouth in the morning, at noon, and at bedtime. 04/24/19   Nathanial Rancher, MD  insulin detemir (LEVEMIR) 100 UNIT/ML injection Inject 0.06 mLs (6 Units total) into the skin at bedtime. 03/16/21   [name], MD  lisinopril-hydrochlorothiazide (ZESTORETIC) 20-12.5 MG tablet Take 1 tablet by mouth daily. 03/16/21   [name], MD  meloxicam (MOBIC) 7.5 MG tablet Take 7.5 mg by mouth daily. 02/19/19   Behalal-Bock, Christele, MD  pravastatin (PRAVACHOL) 20 MG tablet TAKE 1 TABLET BY MOUTH ONCE DAILY WITH SUPPER 04/06/21   [name], MD  rivaroxaban (XARELTO) 20 MG TABS tablet Take 1 tablet (20 mg total) by mouth daily  with supper. 03/16/21   [name], MD  tiZANidine (ZANAFLEX) 2 MG tablet Take 2 mg by mouth every 12 (twelve) hours.    Allyne Gee, MD  traMADol (ULTRAM) 50 MG tablet Take by mouth every 12 (twelve) hours as needed.    Allyne Gee, MD  Vitamin D, Ergocalciferol, (DRISDOL) 1.25 MG (50000 UNIT) CAPS capsule Take 1 capsule (50,000 Units total) by mouth every 7 (seven) days. 03/16/21   [name], MD    Physical Exam: Vitals:   04/19/21 2200 04/19/21 2300 04/19/21 2356  BP: 120/85 (!) 121/95 122/67  Pulse: 80 (!) 58 (!) 101  Resp: 18 (!) 22 (!) 33  Temp: 98 F (36.7 C)    TempSrc: Oral    SpO2: 99% 97% 97%  Weight: 74.8 kg    Height: '5\' 11"'$  (1.803 m)       Vitals:   04/19/21 2200 04/19/21 2300 04/19/21 2356  BP: 120/85 (!) 121/95 122/67  Pulse: 80 (!) 58 (!) 101  Resp: 18 (!)  22 (!) 33  Temp: 98 F (36.7 C)    TempSrc: Oral    SpO2: 99% 97% 97%  Weight: 74.8 kg    Height: '5\' 11"'$  (1.803 m)        Constitutional: Alert and oriented x 3 . Not in any apparent distress HEENT:      Head: Normocephalic and atraumatic.         Eyes: PERLA, EOMI, Conjunctivae are normal. Sclera is non-icteric.       Mouth/Throat: Mucous membranes are moist.       Neck: Supple with no signs of meningismus. Cardiovascular: Irregular, tachycardic. No murmurs, gallops, or rubs. 2+ symmetrical distal pulses are present . No JVD. No LE edema Respiratory: Respiratory effort normal .Lungs sounds clear bilaterally. No wheezes, crackles, or rhonchi.  Gastrointestinal: Soft, non tender, and non distended with positive bowel sounds.  Genitourinary: No CVA tenderness. Musculoskeletal: Nontender with normal range of motion in all extremities. No cyanosis, or erythema of extremities. Neurologic:  Face is symmetric. Moving all extremities. No gross focal neurologic deficits . Skin: Skin is warm, dry.  No rash or ulcers Psychiatric: Mood and affect are normal    Labs on Admission: I have  personally reviewed following labs and imaging studies  CBC: Recent Labs  Lab 04/19/21 2204  WBC 7.2  HGB 19.5*  HCT 55.2*  MCV 89.0  PLT [id number]   Basic Metabolic Panel: Recent Labs  Lab 04/19/21 2204  NA 135  K 4.5  CL 93*  CO2 22  GLUCOSE 132*  BUN 26*  CREATININE 1.34*  CALCIUM 9.6  MG 2.0   GFR: Estimated Creatinine Clearance: 57.4 mL/min (A) (by C-G formula based on SCr of 1.34 mg/dL (H)). Liver Function Tests: No results for input(s): AST, ALT, ALKPHOS, BILITOT, PROT, ALBUMIN in the last 168 hours. No results for input(s): LIPASE, AMYLASE in the last 168 hours. No results for input(s): AMMONIA in the last 168 hours. Coagulation Profile: No results for input(s): INR, PROTIME in the last 168 hours. Cardiac Enzymes: Recent Labs  Lab 04/19/21 2204  CKTOTAL 175   BNP (last 3 results) No results for input(s): PROBNP in the last 8760 hours. HbA1C: No results for input(s): HGBA1C in the last 72 hours. CBG: No results for input(s): GLUCAP in the last 168 hours. Lipid Profile: No results for input(s): CHOL, HDL, LDLCALC, TRIG, CHOLHDL, LDLDIRECT in the last 72 hours. Thyroid Function Tests: Recent Labs    04/19/21 2204  TSH 1.706   Anemia Panel: No results for input(s): VITAMINB12, FOLATE, FERRITIN, TIBC, IRON, RETICCTPCT in the last 72 hours. Urine analysis:    Component Value Date/Time   COLORURINE YELLOW (A) 05/29/2016 1841   APPEARANCEUR CLEAR (A) 05/29/2016 1841   LABSPEC 1.009 05/29/2016 1841   PHURINE 6.0 05/29/2016 1841   GLUCOSEU NEGATIVE 05/29/2016 1841   HGBUR 1+ (A) 05/29/2016 1841   BILIRUBINUR NEGATIVE 05/29/2016 1841   KETONESUR NEGATIVE 05/29/2016 1841   PROTEINUR NEGATIVE 05/29/2016 1841   NITRITE NEGATIVE 05/29/2016 1841   LEUKOCYTESUR NEGATIVE 05/29/2016 1841    Radiological Exams on Admission: DG Chest 2 View  Result Date: 04/19/2021 CLINICAL DATA:  Shortness of breath.  Weakness and nausea. EXAM: CHEST - 2 VIEW COMPARISON:   Remote radiograph 12/11/2007 FINDINGS: Chronic hyperinflation. Minimal bronchial thickening. No focal airspace disease. Normal heart size and mediastinal contours. Aortic atherosclerosis. Minimal biapical pleuroparenchymal scarring. No pleural effusion. No pneumothorax. No pulmonary edema. Remote left rib fractures. Slight anterior wedging of midthoracic vertebra, age indeterminate.  IMPRESSION: Chronic hyperinflation and bronchial thickening, imaging findings consistent with COPD. No focal airspace disease. Electronically Signed   By: Keith Rake M.D.   On: 04/19/2021 22:33     Assessment/Plan 65 year old male with history of COPD, HTN, DM, PAD, recently diagnosed with A. fib on 7/20 presenting with a 3-day history of myalgia, fatigue, cough, shortness of breath    Rapid atrial fibrillation (HCC) -Continue diltiazem infusion started in the ED to transition to oral Cardizem when appropriate - CHA2DS2-VASc of 3 so will benefit from benefit from systemic anticoagulation for primary stroke prevention - We will defer initiation of anticoagulation after running through NOAC choices/insurance.  Was started on Xarelto by PCP on 7/20 but was never filled - Echocardiogram in the a.m. - Cardiology consult for any additional recommendations  COVID infection - Symptomatic for nasal congestion, cough and shortness of breath, muscle aches and fatigue, started on 8/20, reportedly 2 days after getting COVID-vaccine - Remdesivir, albuterol, antitussives and vitamins - Oxygen if needed - No steroids as not hypoxic - Airborne precautions - Chest x-ray was clear - We will get D-dimer if elevated will get CTA chest  AKI: - Creatinine 1.34, up from 0.68 - Received 2 IV fluid boluses in the ED - Continue to monitor    Chronic obstructive pulmonary disease ([place]) - DuoNebs as needed    Essential (primary) hypertension - We will hold antihypertensives tonight while on diltiazem infusion  -Resume  antihypertensives as appropriate    Diabetes mellitus type 2 with peripheral artery disease -sliding scale insulin coverage - Continue Pravachol  Osteoarthritic pain - Continue Neurontin and Zanaflex - We will avoid Mobic as patient being considered for systemic anticoagulation - Tylenol for pain   DVT prophylaxis: Lovenox  Code Status: full code  Family Communication:  none  Disposition Plan: Back to previous home environment Consults called: cardiology  Status:At the time of admission, it appears that the appropriate admission status for this patient is INPATIENT. This is judged to be reasonable and necessary in order to provide the required intensity of service to ensure the patient's safety given the presenting symptoms, physical exam findings, and initial radiographic and laboratory data in the context of their  Comorbid conditions.   Patient requires inpatient status due to high intensity of service, high risk for further deterioration and high frequency of surveillance required.   I certify that at the point of admission it is my clinical judgment that the patient will require inpatient hospital care spanning beyond [place] MD [facility]     04/20/2021, 12:03 AM

## 2021-04-20 NOTE — ED Notes (Signed)
Pt stood at bedside to use urinal

## 2021-04-20 NOTE — Progress Notes (Signed)
Remdesivir - Pharmacy Brief Note   O:  CXR: "Chronic hyperinflation and bronchial thickening, imaging findings consistent with COPD. No focal airspace disease." SpO2: 95-99% on RA   A/P:  Remdesivir 200 mg IVPB once followed by 100 mg IVPB daily x 4 days.   Renda Rolls, PharmD, MBA 04/20/2021 1:22 AM

## 2021-04-20 NOTE — Consult Note (Signed)
ANTICOAGULATION CONSULT NOTE - Initial Consult  Pharmacy Consult for Heparin Indication: atrial fibrillation  Allergies  Allergen Reactions   Penicillins Hives and Swelling    As a child. Has patient had a PCN reaction causing immediate rash, facial/tongue/throat swelling, SOB or lightheadedness with hypotension: YES  Has patient had a PCN reaction causing severe rash involving mucus membranes or skin necrosis: UNKNOWN Has patient had a PCN reaction that required hospitalization: UNKNOWN Has patient had a PCN reaction occurring within the last 10 years: NO   Aspirin Nausea Only   Ibuprofen Nausea Only   Lyrica [Pregabalin] Nausea Only   Acetaminophen Nausea Only    Patient Measurements: Height: '5\' 11"'$  (180.3 cm) Weight: 74.8 kg (165 lb) IBW/kg (Calculated) : 75.3 Heparin Dosing Weight: 74.8  Vital Signs: Temp: 98 F (36.7 C) (08/23 2200) Temp Source: Oral (08/23 2200) BP: 121/76 (08/24 0800) Pulse Rate: 92 (08/24 0800)  Labs: Recent Labs    04/19/21 2204 04/20/21 0131  HGB 19.5*  --   HCT 55.2*  --   PLT 159  --   CREATININE 1.34*  --   CKTOTAL 175  --   TROPONINIHS 14 11   Baseline labs aPTT : pending (ordered) PT/INR: pending (ordered)  Estimated Creatinine Clearance: 57.4 mL/min (A) (by C-G formula based on SCr of 1.34 mg/dL (H)).   Medical History: Past Medical History:  Diagnosis Date   Arthritis    Asthma    Benign neoplasm of ascending colon    polyps   Constipation    COPD (chronic obstructive pulmonary disease) ([place])    NO INHALER   Diabetes mellitus without complication (HCC)    Type 2   Diverticulosis    Dyspnea    RARE-WITH EXERTION DUE TO COPD   GERD (gastroesophageal reflux disease)    Hepatitis C    History of kidney stones    H/O   Hyperlipidemia    Hypertension 06/10/2018   currently not under control   Left inguinal hernia    Peripheral vascular disease (HCC)    Restless legs    Schizoaffective disorder (HCC)    Pt denies    Thrombocytopenia (HCC)    Vitamin D deficiency     Assessment:  Patient presented to Emergency department increased fatigue, cough, and SOB.  Noted recent dx of Atrial fibrillation , but prescription for Xarelto was never started by patient.   PMH includes COPD, HTN, DM ,and PAD. Noted patient also COVID positive. ECHO pending. Pharmacy was consulted to management of heparin for A.Fib.     Goal of Therapy:  Heparin level 0.3-0.7 units/ml Monitor platelets by anticoagulation protocol: Yes   Plan:  Give 4000 units bolus x 1 Start heparin infusion at 1100 units/hr Check anti-Xa level in 6 hours and daily while on heparin Continue to monitor H&H and platelets  Adetokunbo Mccadden Rodriguez-Guzman PharmD, BCPS 04/20/2021 9:05 AM

## 2021-04-20 NOTE — ED Notes (Signed)
Pt changed into gown and all belongings put in bag.

## 2021-04-20 NOTE — Consult Note (Signed)
ANTICOAGULATION CONSULT NOTE  Pharmacy Consult for Heparin Indication: atrial fibrillation  Allergies  Allergen Reactions   Penicillins Hives and Swelling    As a child. Has patient had a PCN reaction causing immediate rash, facial/tongue/throat swelling, SOB or lightheadedness with hypotension: YES  Has patient had a PCN reaction causing severe rash involving mucus membranes or skin necrosis: UNKNOWN Has patient had a PCN reaction that required hospitalization: UNKNOWN Has patient had a PCN reaction occurring within the last 10 years: NO   Aspirin Nausea Only   Ibuprofen Nausea Only   Lyrica [Pregabalin] Nausea Only   Acetaminophen Nausea Only    Patient Measurements: Height: '5\' 11"'$  (180.3 cm) Weight: 74.8 kg (165 lb) IBW/kg (Calculated) : 75.3 Heparin Dosing Weight: 74.8  Vital Signs: Temp: 97.9 F (36.6 C) (08/24 1556) BP: 153/76 (08/24 1556) Pulse Rate: 80 (08/24 1556)  Labs: Recent Labs    04/19/21 2204 04/20/21 0131 04/20/21 0920 04/20/21 1602  HGB 19.5*  --   --   --   HCT 55.2*  --   --   --   PLT 159  --   --   --   APTT  --   --  28  --   LABPROT  --   --  13.4  --   INR  --   --  1.0  --   HEPARINUNFRC  --   --   --  0.93*  CREATININE 1.34*  --   --   --   CKTOTAL 175  --   --   --   TROPONINIHS 14 11  --   --     Baseline labs aPTT : pending (ordered) PT/INR: pending (ordered)  Estimated Creatinine Clearance: 57.4 mL/min (A) (by C-G formula based on SCr of 1.34 mg/dL (H)).   Medical History: Past Medical History:  Diagnosis Date   Arthritis    Asthma    Benign neoplasm of ascending colon    polyps   Constipation    COPD (chronic obstructive pulmonary disease) ([place])    NO INHALER   Diabetes mellitus without complication (HCC)    Type 2   Diverticulosis    Dyspnea    RARE-WITH EXERTION DUE TO COPD   GERD (gastroesophageal reflux disease)    Hepatitis C    History of kidney stones    H/O   Hyperlipidemia    Hypertension 06/10/2018    currently not under control   Left inguinal hernia    Peripheral vascular disease (HCC)    Restless legs    Schizoaffective disorder (HCC)    Pt denies   Thrombocytopenia (HCC)    Vitamin D deficiency     Assessment:  Patient presented to Emergency department increased fatigue, cough, and SOB.  Noted recent dx of Atrial fibrillation , but prescription for Xarelto was never started by patient.   PMH includes COPD, HTN, DM ,and PAD. Noted patient also COVID positive. ECHO pending. Pharmacy was consulted to management of heparin for A.Fib.     Goal of Therapy:  Heparin level 0.3-0.7 units/ml Monitor platelets by anticoagulation protocol: Yes   Plan:  Heparin level supratherapeutic Decrease heparin infusion to 950 units/hr Recheck HL 8/25 at 0000 CBC daily while on heparin infusion  Tawnya Crook, PharmD, BCPS Clinical Pharmacist 04/20/2021 4:48 PM

## 2021-04-20 NOTE — Progress Notes (Signed)
*  PRELIMINARY RESULTS* Echocardiogram 2D Echocardiogram has been performed.  Mark Nash 04/20/2021, 10:59 AM

## 2021-04-20 NOTE — ED Notes (Signed)
MAggie RN aware of assigned bed

## 2021-04-20 NOTE — Progress Notes (Signed)
Brief hospitalist update note.  This is a nonbillable note.  Please see same-day H&P from Dr. Damita Dunnings for full billable details.  Briefly, this is a 65 year old male with history significant for COPD, hypertension, diabetes, peripheral arterial disease who presents to the ED with 3-day complaint of nausea, decreased oral intake, muscle cramps.  Also has shortness of breath.  He attributes his symptoms to receiving the COVID and shingles vaccine on 8/20. Recently diagnosed with new onset atrial fibrillation in PCP office on 7/20.  Was started on Xarelto for CHA2DS2-VASc of 4 but however did not start the medication as the pharmacist told him it would interact with the Mobic he was on.  On arrival patient was found to be in atrial fibrillation with rapid ventricular response.  Rates of 145.  Started on diltiazem gtt. with good response.  Patient was also found to be COVID-positive.  Plan: Continue diltiazem gtt. for now Start p.o. Cardizem 30 mg every 6 hours Wean drip as able Started on remdesivir for COVID infection, will continue We will reach out to cardiology as admitting physician did place consult for [facility].  Ralene Muskrat MD  No charge

## 2021-04-21 DIAGNOSIS — J449 Chronic obstructive pulmonary disease, unspecified: Secondary | ICD-10-CM | POA: Diagnosis not present

## 2021-04-21 DIAGNOSIS — E1151 Type 2 diabetes mellitus with diabetic peripheral angiopathy without gangrene: Secondary | ICD-10-CM | POA: Diagnosis not present

## 2021-04-21 DIAGNOSIS — U071 COVID-19: Secondary | ICD-10-CM

## 2021-04-21 DIAGNOSIS — I4891 Unspecified atrial fibrillation: Secondary | ICD-10-CM

## 2021-04-21 DIAGNOSIS — I10 Essential (primary) hypertension: Secondary | ICD-10-CM

## 2021-04-21 LAB — CBC
HCT: 44.5 % (ref 39.0–52.0)
Hemoglobin: 16.3 g/dL (ref 13.0–17.0)
MCH: 32.5 pg (ref 26.0–34.0)
MCHC: 36.6 g/dL — ABNORMAL HIGH (ref 30.0–36.0)
MCV: 88.8 fL (ref 80.0–100.0)
Platelets: 137 10*3/uL — ABNORMAL LOW (ref 150–400)
RBC: 5.01 MIL/uL (ref 4.22–5.81)
RDW: 12.3 % (ref 11.5–15.5)
WBC: 5.2 10*3/uL (ref 4.0–10.5)
nRBC: 0 % (ref 0.0–0.2)

## 2021-04-21 LAB — GLUCOSE, CAPILLARY
Glucose-Capillary: 114 mg/dL — ABNORMAL HIGH (ref 70–99)
Glucose-Capillary: 170 mg/dL — ABNORMAL HIGH (ref 70–99)
Glucose-Capillary: 133 mg/dL — ABNORMAL HIGH (ref 70–99)
Glucose-Capillary: 189 mg/dL — ABNORMAL HIGH (ref 70–99)

## 2021-04-21 LAB — BASIC METABOLIC PANEL
Anion gap: 11 (ref 5–15)
BUN: 16 mg/dL (ref 8–23)
CO2: 22 mmol/L (ref 22–32)
Calcium: 8.7 mg/dL — ABNORMAL LOW (ref 8.9–10.3)
Chloride: 101 mmol/L (ref 98–111)
Creatinine, Ser: 0.7 mg/dL (ref 0.61–1.24)
GFR, Estimated: >60 mL/min (ref >60)
Glucose, Bld: 142 mg/dL — ABNORMAL HIGH (ref 70–99)
Potassium: 3.8 mmol/L (ref 3.5–5.1)
Sodium: 134 mmol/L — ABNORMAL LOW (ref 135–145)

## 2021-04-21 LAB — HEPARIN LEVEL (UNFRACTIONATED)
Heparin Unfractionated: 0.3 IU/mL (ref 0.30–0.70)
Heparin Unfractionated: 0.33 IU/mL (ref 0.30–0.70)

## 2021-04-21 MED ORDER — DILTIAZEM HCL ER COATED BEADS 120 MG PO CP24
120.0000 mg | ORAL_CAPSULE | Freq: Two times a day (BID) | ORAL | Status: DC
  Administered 2021-04-21: 120 mg via ORAL
  Filled 2021-04-21: qty 1

## 2021-04-21 MED ORDER — DILTIAZEM HCL ER COATED BEADS 180 MG PO CP24: 180.0000 mg | ORAL_CAPSULE | Freq: Every day | ORAL | Status: DC

## 2021-04-21 MED ORDER — DILTIAZEM HCL ER COATED BEADS 120 MG PO CP24
120.0000 mg | ORAL_CAPSULE | Freq: Every day | ORAL | Status: DC
  Administered 2021-04-21: 120 mg via ORAL
  Filled 2021-04-21: qty 1

## 2021-04-21 MED ORDER — PRAVASTATIN SODIUM 20 MG PO TABS
20.0000 mg | ORAL_TABLET | Freq: Every day | ORAL | Status: DC
  Administered 2021-04-21: 20 mg via ORAL
  Filled 2021-04-21: qty 1

## 2021-04-21 MED ORDER — RIVAROXABAN 20 MG PO TABS
20.0000 mg | ORAL_TABLET | Freq: Every day | ORAL | Status: DC
  Administered 2021-04-21: 20 mg via ORAL
  Filled 2021-04-21: qty 1

## 2021-04-21 MED ORDER — DILTIAZEM HCL 25 MG/5ML IV SOLN
10.0000 mg | Freq: Once | INTRAVENOUS | Status: AC
  Administered 2021-04-21: 10 mg via INTRAVENOUS

## 2021-04-21 MED ORDER — DILTIAZEM HCL 25 MG/5ML IV SOLN
INTRAVENOUS | Status: AC
  Administered 2021-04-21: 25 mg
  Filled 2021-04-21: qty 5

## 2021-04-21 NOTE — Consult Note (Signed)
ANTICOAGULATION CONSULT NOTE  Pharmacy Consult for Heparin Indication: atrial fibrillation  Allergies  Allergen Reactions   Penicillins Hives and Swelling    As a child. Has patient had a PCN reaction causing immediate rash, facial/tongue/throat swelling, SOB or lightheadedness with hypotension: YES  Has patient had a PCN reaction causing severe rash involving mucus membranes or skin necrosis: UNKNOWN Has patient had a PCN reaction that required hospitalization: UNKNOWN Has patient had a PCN reaction occurring within the last 10 years: NO   Aspirin Nausea Only   Ibuprofen Nausea Only   Lyrica [Pregabalin] Nausea Only   Acetaminophen Nausea Only    Patient Measurements: Height: '5\' 11"'$  (180.3 cm) Weight: 74.8 kg (165 lb) IBW/kg (Calculated) : 75.3 Heparin Dosing Weight: 74.8  Vital Signs: Temp: 98.9 F (37.2 C) (08/24 2356) Temp Source: Oral (08/24 1943) BP: 131/87 (08/24 2356) Pulse Rate: 78 (08/24 2356)  Labs: Recent Labs    04/19/21 2204 04/20/21 0131 04/20/21 0920 04/20/21 1602 04/21/21 0035  HGB 19.5*  --   --   --  16.3  HCT 55.2*  --   --   --  44.5  PLT 159  --   --   --  137*  APTT  --   --  28  --   --   LABPROT  --   --  13.4  --   --   INR  --   --  1.0  --   --   HEPARINUNFRC  --   --   --  0.93* 0.33  CREATININE 1.34*  --   --   --   --   CKTOTAL 175  --   --   --   --   TROPONINIHS 14 11  --   --   --     Baseline labs aPTT : pending (ordered) PT/INR: pending (ordered)  Estimated Creatinine Clearance: 57.4 mL/min (A) (by C-G formula based on SCr of 1.34 mg/dL (H)).   Medical History: Past Medical History:  Diagnosis Date   Arthritis    Asthma    Benign neoplasm of ascending colon    polyps   Constipation    COPD (chronic obstructive pulmonary disease) ([place])    NO INHALER   Diabetes mellitus without complication (HCC)    Type 2   Diverticulosis    Dyspnea    RARE-WITH EXERTION DUE TO COPD   GERD (gastroesophageal reflux disease)     Hepatitis C    History of kidney stones    H/O   Hyperlipidemia    Hypertension 06/10/2018   currently not under control   Left inguinal hernia    Peripheral vascular disease (HCC)    Restless legs    Schizoaffective disorder (HCC)    Pt denies   Thrombocytopenia (HCC)    Vitamin D deficiency     Assessment:  Patient presented to Emergency department increased fatigue, cough, and SOB.  Noted recent dx of Atrial fibrillation , but prescription for Xarelto was never started by patient.   PMH includes COPD, HTN, DM ,and PAD. Noted patient also COVID positive. ECHO pending. Pharmacy was consulted to management of heparin for A.Fib.     Goal of Therapy:  Heparin level 0.3-0.7 units/ml Monitor platelets by anticoagulation protocol: Yes   Plan:  Heparin level therapeutic Continue heparin infusion at 950 units/hr Recheck HL in 6 hr to confirm CBC daily while on heparin infusion  Renda Rolls, PharmD, [facility] 04/21/2021 1:05 AM

## 2021-04-21 NOTE — Progress Notes (Signed)
Patient c/o soreness to sacral area. Instructed patient to lay from side to side while in bed and to not lay on his back due to the pressure to this bony area. Foam applied to mid sacral area. Stage 1, nonblanchable redness noted. Will continue to instruct patient to reposition. Patient able to move self in bed. Steady gait.

## 2021-04-21 NOTE — Progress Notes (Signed)
PROGRESS NOTE    Mark Nash  [id number] DOB: Jan 23, 1956 DOA: 04/19/2021 PCP: [name], MD    Brief Narrative:   65 year old male with history significant for COPD, hypertension, diabetes, peripheral arterial disease who presents to the ED with 3-day complaint of nausea, decreased oral intake, muscle cramps.  Also has shortness of breath.  He attributes his symptoms to receiving the COVID and shingles vaccine on 8/20. Recently diagnosed with new onset atrial fibrillation in PCP office on 7/20.  Was started on Xarelto for CHA2DS2-VASc of 4 but however did not start the medication as the pharmacist told him it would interact with the Mobic he was on.   On arrival patient was found to be in atrial fibrillation with rapid ventricular response.  Rates of 145.  Started on diltiazem gtt. with good response.  Patient was also found to be COVID-positive.  Heart rate improved.  Cardiology consulted.  Diltiazem consolidated to once daily CD dosing.  Occasional tachycardia associated with coughing fits.  Overall stable.  Assessment & Plan:   Principal Problem:   Rapid atrial fibrillation (HCC) Active Problems:   Chronic obstructive pulmonary disease (HCC)   Essential (primary) hypertension   Diabetes mellitus type 2 with peripheral artery disease (HCC)   AKI (acute kidney injury) ([place])  Atrial fibrillation with rapid ventricular response Diltiazem drip with good response started in ED CHA2DS2-VASc of 3 Plan: Cardiology consult and recommendations appreciated DC diltiazem drip Start Cardizem CD 120 mg daily Xarelto for anticoagulation Continue telemetry monitoring Possible discharge in a.m. if heart rate controlled  COVID infection Mildly symptomatic with nasal congestion, cough, shortness of breath, muscle aches and fatigue No pneumonia, fever, oxygen requirement Plan: Remdesivir x3-day course Airborne precautions Defer steroids  Acute kidney injury Improved after IV  fluid bolus  COPD Not acutely exacerbated As needed nebs  Essential hypertension Currently on hold while on diltiazem Monitor BP and restart as appropriate  Type 2 diabetes mellitus with peripheral arterial disease Sliding-scale insulin PTA pravastatin  Osteoarthritis Continue Neurontin and Zanaflex Hold Mobic and discontinue on discharge   DVT prophylaxis: Xarelto Code Status: Full Family Communication: None Disposition Plan: Status is: Inpatient  Remains inpatient appropriate because:Inpatient level of care appropriate due to severity of illness  Dispo: The patient is from: Home              Anticipated d/c is to: Home              Patient currently is not medically stable to d/c.   Difficult to place patient No       Level of care: Progressive Cardiac  Consultants:  Cardiology  Procedures:  None  Antimicrobials:  None   Subjective: And examined.  Reports improvement in shortness of breath.  No palpitations.  No pain complaints  Objective: Vitals:   04/21/21 0700 04/21/21 0832 04/21/21 1223 04/21/21 1328  BP: 132/81 140/89 (!) 136/99   Pulse: 79 67 (!) 121 99  Resp: '14 20 16   '$ Temp: 98 F (36.7 C) 98.2 F (36.8 C) 98 F (36.7 C)   TempSrc: Oral  Oral   SpO2: 97% 99% 99%   Weight:      Height:        Intake/Output Summary (Last 24 hours) at 04/21/2021 1356 Last data filed at 04/21/2021 0528 Gross per 24 hour  Intake 608.53 ml  Output 325 ml  Net 283.53 ml   Filed Weights   04/19/21 2200  Weight: 74.8 kg  Examination:  General exam: Appears calm and comfortable  Respiratory system: Decreased lung sounds bilaterally.  Mild bibasilar crackles.  Normal work of breathing.  Room air Cardiovascular system: S1-S2, regular rate, irregular rhythm, no murmurs, no pedal edema Gastrointestinal system: Abdomen is nondistended, soft and nontender. No organomegaly or masses felt. Normal bowel sounds heard. Central nervous system: Alert and  oriented. No focal neurological deficits. Extremities: Symmetric 5 x 5 power. Skin: No rashes, lesions or ulcers Psychiatry: Judgement and insight appear normal. Mood & affect appropriate.     Data Reviewed: I have personally reviewed following labs and imaging studies  CBC: Recent Labs  Lab 04/19/21 2204 04/21/21 0035  WBC 7.2 5.2  HGB 19.5* 16.3  HCT 55.2* 44.5  MCV 89.0 88.8  PLT 159 0000000*   Basic Metabolic Panel: Recent Labs  Lab 04/19/21 2204 04/21/21 0035  NA 135 134*  K 4.5 3.8  CL 93* 101  CO2 22 22  GLUCOSE 132* 142*  BUN 26* 16  CREATININE 1.34* 0.70  CALCIUM 9.6 8.7*  MG 2.0  --    GFR: Estimated Creatinine Clearance: 96.1 mL/min (by C-G formula based on SCr of 0.7 mg/dL). Liver Function Tests: No results for input(s): AST, ALT, ALKPHOS, BILITOT, PROT, ALBUMIN in the last 168 hours. No results for input(s): LIPASE, AMYLASE in the last 168 hours. No results for input(s): AMMONIA in the last 168 hours. Coagulation Profile: Recent Labs  Lab 04/20/21 0920  INR 1.0   Cardiac Enzymes: Recent Labs  Lab 04/19/21 2204  CKTOTAL 175   BNP (last 3 results) No results for input(s): PROBNP in the last 8760 hours. HbA1C: No results for input(s): HGBA1C in the last 72 hours. CBG: Recent Labs  Lab 04/20/21 1156 04/20/21 1606 04/20/21 1946 04/21/21 0829 04/21/21 1238  GLUCAP 116* 137* 185* 114* 170*   Lipid Profile: No results for input(s): CHOL, HDL, LDLCALC, TRIG, CHOLHDL, LDLDIRECT in the last 72 hours. Thyroid Function Tests: Recent Labs    04/19/21 2204  TSH 1.706   Anemia Panel: No results for input(s): VITAMINB12, FOLATE, FERRITIN, TIBC, IRON, RETICCTPCT in the last 72 hours. Sepsis Labs: No results for input(s): PROCALCITON, LATICACIDVEN in the last 168 hours.  Recent Results (from the past 240 hour(s))  Resp Panel by RT-PCR (Flu A&B, Covid) Nasopharyngeal Swab     Status: Abnormal   Collection Time: 04/19/21 11:26 PM   Specimen:  Nasopharyngeal Swab; Nasopharyngeal(NP) swabs in vial transport medium  Result Value Ref Range Status   SARS Coronavirus 2 by RT PCR POSITIVE (A) NEGATIVE Final    Comment: RESULT CALLED TO, READ BACK BY AND VERIFIED WITH: RAQUEL DAVID '@0105'$  ON 04/20/21 SK[facility] (NOTE) SARS-CoV-2 target nucleic acids are DETECTED.  The SARS-CoV-2 RNA is generally detectable in upper respiratory specimens during the acute phase of infection. Positive results are indicative of the presence of the identified virus, but do not rule out bacterial infection or co-infection with other pathogens not detected by the test. Clinical correlation with patient history and other diagnostic information is necessary to determine patient infection status. The expected result is Negative.  Fact Sheet for Patients: EntrepreneurPulse.com.au  Fact Sheet for Healthcare Providers: IncredibleEmployment.be  This test is not yet approved or cleared by the [place] FDA and  has been authorized for detection and/or diagnosis of SARS-CoV-2 by FDA under an Emergency Use Authorization (EUA).  This EUA will remain in effect (meaning this test can be  used) for the duration of  the COVID-19 declaration under  Section 564(b)(1) of the Act, 21 U.S.C. section 360bbb-3(b)(1), unless the authorization is terminated or revoked sooner.     Influenza A by PCR NEGATIVE NEGATIVE Final   Influenza B by PCR NEGATIVE NEGATIVE Final    Comment: (NOTE) The Xpert Xpress SARS-CoV-2/FLU/RSV plus assay is intended as an aid in the diagnosis of influenza from Nasopharyngeal swab specimens and should not be used as a sole basis for treatment. Nasal washings and aspirates are unacceptable for Xpert Xpress SARS-CoV-2/FLU/RSV testing.  Fact Sheet for Patients: EntrepreneurPulse.com.au  Fact Sheet for Healthcare Providers: IncredibleEmployment.be  This test is not yet approved  or cleared by the [place] FDA and has been authorized for detection and/or diagnosis of SARS-CoV-2 by FDA under an Emergency Use Authorization (EUA). This EUA will remain in effect (meaning this test can be used) for the duration of the COVID-19 declaration under Section 564(b)(1) of the Act, 21 U.S.C. section 360bbb-3(b)(1), unless the authorization is terminated or revoked.  Performed at [facility], [address] [place], [place] [address]          Radiology Studies: DG Chest 2 View  Result Date: 04/19/2021 CLINICAL DATA:  Shortness of breath.  Weakness and nausea. EXAM: CHEST - 2 VIEW COMPARISON:  Remote radiograph 12/11/2007 FINDINGS: Chronic hyperinflation. Minimal bronchial thickening. No focal airspace disease. Normal heart size and mediastinal contours. Aortic atherosclerosis. Minimal biapical pleuroparenchymal scarring. No pleural effusion. No pneumothorax. No pulmonary edema. Remote left rib fractures. Slight anterior wedging of midthoracic vertebra, age indeterminate. IMPRESSION: Chronic hyperinflation and bronchial thickening, imaging findings consistent with COPD. No focal airspace disease. Electronically Signed   By: Keith Rake M.D.   On: 04/19/2021 22:33   ECHOCARDIOGRAM COMPLETE  Result Date: 04/20/2021    ECHOCARDIOGRAM REPORT   Patient Name:   REDDINGTON GRIECO Carra Date of Exam: 04/20/2021 Medical Rec #:  [id number]       Height:       71.0 in Accession #:    [id number]      Weight:       165.0 lb Date of Birth:  1955/11/14       BSA:          1.943 m Patient Age:    50 years        BP:           124/88 mmHg Patient Gender: M               HR:           103 bpm. Exam Location:  [facility] Procedure: 2D Echo, Cardiac Doppler and Color Doppler Indications:     Atrial Fibrillation I48.91  History:         Patient has no prior history of Echocardiogram examinations.                  COPD, Signs/Symptoms:Dyspnea; Risk Factors:Hypertension.  Sonographer:     Sherrie Sport  Referring Phys:  [id number] Athena Masse Diagnosing Phys: Yolonda Kida MD  Sonographer Comments: Technically challenging study due to limited acoustic windows, no apical window and no parasternal window. Image acquisition challenging due to COPD and Subcostal was the only view obtainable. IMPRESSIONS  1. Normal LVF.  2. Left ventricular ejection fraction, by estimation, is 55 to 60%. Left ventricular ejection fraction by PLAX is 55 %. The left ventricle has normal function. The left ventricle has no regional wall motion abnormalities. Left ventricular diastolic function could not be evaluated.  3. Right ventricular  systolic function is normal. The right ventricular size is normal.  4. The mitral valve is grossly normal. Trivial mitral valve regurgitation.  5. The aortic valve is normal in structure. Aortic valve regurgitation is not visualized. Conclusion(s)/Recommendation(s): Normal biventricular function without evidence of hemodynamically significant valvular heart disease. FINDINGS  Left Ventricle: Left ventricular ejection fraction, by estimation, is 55 to 60%. Left ventricular ejection fraction by PLAX is 55 %. The left ventricle has normal function. The left ventricle has no regional wall motion abnormalities. The left ventricular internal cavity size was normal in size. There is no left ventricular hypertrophy. Left ventricular diastolic function could not be evaluated. Right Ventricle: The right ventricular size is normal. No increase in right ventricular wall thickness. Right ventricular systolic function is normal. Left Atrium: Left atrial size was normal in size. Right Atrium: Right atrial size was normal in size. Pericardium: There is no evidence of pericardial effusion. Mitral Valve: The mitral valve is grossly normal. Trivial mitral valve regurgitation. Tricuspid Valve: The tricuspid valve is normal in structure. Tricuspid valve regurgitation is mild. Aortic Valve: The aortic valve is normal in  structure. Aortic valve regurgitation is not visualized. Pulmonic Valve: The pulmonic valve was normal in structure. Pulmonic valve regurgitation is not visualized. Aorta: The ascending aorta was not well visualized. IAS/Shunts: The interatrial septum was not well visualized. Additional Comments: Normal LVF.  LEFT VENTRICLE PLAX 2D LV EF:         Left ventricular ejection fraction by PLAX is 55 %. LVIDd:         3.90 cm LVIDs:         2.80 cm LV IVS:        1.20 cm  LEFT ATRIUM            Index       RIGHT ATRIUM           Index LA diam:      5.10 cm  2.62 cm/m  RA Area:     17.90 cm LA Vol (A4C): 101.0 ml 51.98 ml/m RA Volume:   52.10 ml  26.81 ml/m  PULMONIC VALVE PV Vmax:        1.14 m/s PV Peak grad:   5.2 mmHg RVOT Peak grad: 8 mmHg  Dwayne D Callwood MD Electronically signed by Yolonda Kida MD Signature Date/Time: 04/20/2021/2:22:56 PM    Final         Scheduled Meds:  albuterol  2 puff Inhalation Q6H   vitamin C  500 mg Oral Daily   diltiazem  120 mg Oral BID   insulin aspart  0-5 Units Subcutaneous QHS   insulin aspart  0-9 Units Subcutaneous TID WC   pravastatin  20 mg Oral q1800   rivaroxaban  20 mg Oral Q supper   zinc sulfate  220 mg Oral Daily   Continuous Infusions:  remdesivir 100 mg in NS 100 mL 100 mg (04/21/21 0908)     LOS: 2 days    Time spent: 35 minutes    Sidney Ace, MD [facility] Pager [phone]  If 7PM-7AM, please contact night-coverage  04/21/2021, 1:56 PM

## 2021-04-21 NOTE — Consult Note (Signed)
[place] for Xarelto Indication: atrial fibrillation  Allergies  Allergen Reactions   Penicillins Hives and Swelling    As a child. Has patient had a PCN reaction causing immediate rash, facial/tongue/throat swelling, SOB or lightheadedness with hypotension: YES  Has patient had a PCN reaction causing severe rash involving mucus membranes or skin necrosis: UNKNOWN Has patient had a PCN reaction that required hospitalization: UNKNOWN Has patient had a PCN reaction occurring within the last 10 years: NO   Aspirin Nausea Only   Ibuprofen Nausea Only   Lyrica [Pregabalin] Nausea Only   Acetaminophen Nausea Only    Patient Measurements: Height: '5\' 11"'$  (180.3 cm) Weight: 74.8 kg (165 lb) IBW/kg (Calculated) : 75.3 Heparin Dosing Weight: 74.8  Vital Signs: Temp: 98 F (36.7 C) (08/25 1223) Temp Source: Oral (08/25 1223) BP: 136/99 (08/25 1223) Pulse Rate: 121 (08/25 1223)  Labs: Recent Labs    04/19/21 2204 04/20/21 0131 04/20/21 0920 04/20/21 1602 04/21/21 0035 04/21/21 0657  HGB 19.5*  --   --   --  16.3  --   HCT 55.2*  --   --   --  44.5  --   PLT 159  --   --   --  137*  --   APTT  --   --  28  --   --   --   LABPROT  --   --  13.4  --   --   --   INR  --   --  1.0  --   --   --   HEPARINUNFRC  --   --   --  0.93* 0.33 0.30  CREATININE 1.34*  --   --   --  0.70  --   CKTOTAL 175  --   --   --   --   --   TROPONINIHS 14 11  --   --   --   --     Baseline labs aPTT : pending (ordered) PT/INR: pending (ordered)  Estimated Creatinine Clearance: 96.1 mL/min (by C-G formula based on SCr of 0.7 mg/dL).   Medical History: Past Medical History:  Diagnosis Date   Arthritis    Asthma    Benign neoplasm of ascending colon    polyps   Constipation    COPD (chronic obstructive pulmonary disease) ([place])    NO INHALER   Diabetes mellitus without complication (HCC)    Type 2   Diverticulosis    Dyspnea    RARE-WITH EXERTION  DUE TO COPD   GERD (gastroesophageal reflux disease)    Hepatitis C    History of kidney stones    H/O   Hyperlipidemia    Hypertension 06/10/2018   currently not under control   Left inguinal hernia    Peripheral vascular disease (HCC)    Restless legs    Schizoaffective disorder (HCC)    Pt denies   Thrombocytopenia (HCC)    Vitamin D deficiency     Assessment: Patient presented to ED with increased fatigue, cough, and SOB.  Noted recent dx of Atrial fibrillation , but prescription for Xarelto was never started by patient. PMH includes COPD, HTN, DM ,and PAD. Noted patient also COVID positive. Pt currently on heparin drip. Pharmacy has been consulted for Xarelto for afib.   Goal of Therapy:  Monitor platelets by anticoagulation protocol: Yes   Plan:  Discontinue heparin drip and start Xarelto 20 mg daily (CrCl >50 ml/min, used TBW)  CBC per protocol  Sherilyn Banker, PharmD 04/21/2021 12:39 PM

## 2021-04-21 NOTE — Progress Notes (Signed)
Cardiology Consultation:   Due to the COVID-19 pandemic, this visit was completed with telemedicine (audio/video) technology to reduce patient and provider exposure as well as to preserve personal protective equipment.   Patient ID: Mark Nash MRN: [id number]; DOB: [birth year]  Admit date: 04/19/2021 Date of Consult: 04/21/2021  Primary Care Provider: [name], MD Primary Cardiologist: New Primary Electrophysiologist:  None   Patient Profile:   Mark Nash is a 65 y.o. male with a hx of COPD, tobacco use, HTN, DM2, PAD, schizophrenia/schizoaffective disorder who is being seen today for the evaluation of Afib at the request of Dr. Priscella Mann.  History of Present Illness:   Mr. Seckinger has not been seen by [facility] in the past. Seen by PCP 03/16/21 and diagnosed with AFIB with for CHADSVASC of 4 started on Xarelto. Patient reports he did not start the medication as it would interact with Mobic. He has no prior h/o MI, stent, CHF. He smokes 1.5 ppd. No alcohol or drug use. Lives by himself and can care for himself. Does not work. Family h/o brother with CHF and dad with birth defect.  The patient presented to the ER 04/19/21 for nausea, poor oral intake, headache, nasal congestion, with mild SOB. Symptoms started 3 days earlier. Patient received the COVID and shingles vaccine 8/20. He denies chest pain. No LLE, orthopnea, pnd, palpitations, lightheadedness of dizziness. No fever, sometimes chills.   In the ER BP 120/85, pulse 80, 99%O2. Labs showed Hgb 19.5, potassium 4.5, Mag 2, TSH 1.7, Cr 1.34, BUN 26. COVID positive. EKG showed afib RVR 145bpm. CXR showed COPD and no acute disease. Started on IV cardizem and given IVF and admitted.   Spoke to patient on the phone and reports he is overall feeling better. Not on O2,. Has been up and about the room and breathing better.   Past Medical History:  Diagnosis Date   Arthritis    Asthma    Benign neoplasm of ascending colon    polyps    Constipation    COPD (chronic obstructive pulmonary disease) (HCC)    NO INHALER   Diabetes mellitus without complication (HCC)    Type 2   Diverticulosis    Dyspnea    RARE-WITH EXERTION DUE TO COPD   GERD (gastroesophageal reflux disease)    Hepatitis C    History of kidney stones    H/O   Hyperlipidemia    Hypertension 06/10/2018   currently not under control   Left inguinal hernia    Peripheral vascular disease (HCC)    Restless legs    Schizoaffective disorder (HCC)    Pt denies   Thrombocytopenia ([place])    Vitamin D deficiency     Past Surgical History:  Procedure Laterality Date   BACK SURGERY     COLONOSCOPY WITH PROPOFOL N/A 09/07/2017   Procedure: COLONOSCOPY WITH PROPOFOL;  Surgeon: Virgel Manifold, MD;  Location: [facility] ENDOSCOPY;  Service: Endoscopy;  Laterality: N/A;   ESOPHAGOGASTRODUODENOSCOPY (EGD) WITH PROPOFOL N/A 12/07/2017   Procedure: ESOPHAGOGASTRODUODENOSCOPY (EGD) WITH PROPOFOL;  Surgeon: Virgel Manifold, MD;  Location: [facility] ENDOSCOPY;  Service: Endoscopy;  Laterality: N/A;   FLEXIBLE SIGMOIDOSCOPY N/A 11/09/2017   Procedure: FLEXIBLE SIGMOIDOSCOPY;  Surgeon: Virgel Manifold, MD;  Location: [facility] ENDOSCOPY;  Service: Endoscopy;  Laterality: N/A;   HERNIA REPAIR     inguinal   INGUINAL HERNIA REPAIR Bilateral 06/21/2018   Procedure: LAPAROSCOPIC BILATERAL INGUINAL HERNIA REPAIR;  Surgeon: Vickie Epley, MD;  Location: [facility] ORS;  Service: General;  Laterality: Bilateral;   L3 TO L5 LAMINECTOMY FOR DECOMPRESSION  07/17/2016   [facility]   REPAIR OF CEREBROSPINAL FLUID LEAK N/A 09/08/2016   Procedure: Lumbar wound exploration, repair of pseudomenigocele;  Surgeon: Kevan Ny Ditty, MD;  Location: [place];  Service: Neurosurgery;  Laterality: N/A;  Lumbar wound exploration, repair of pseudomenigocele   SINUSOTOMY       Home Medications:  Prior to Admission medications   Medication Sig Start Date End Date Taking?  Authorizing Provider  Fluticasone-Umeclidin-Vilant (TRELEGY ELLIPTA) 100-62.5-25 MCG/INH AEPB Take 1 puff by mouth every evening. 03/16/21  Yes Sowles, Drue Stager, MD  gabapentin (NEURONTIN) 300 MG capsule Take 300 mg by mouth in the morning, at noon, and at bedtime. 04/24/19  Yes Nathanial Rancher, MD  insulin detemir (LEVEMIR) 100 UNIT/ML injection Inject 0.06 mLs (6 Units total) into the skin at bedtime. 03/16/21  Yes Sowles, Drue Stager, MD  lisinopril-hydrochlorothiazide (ZESTORETIC) 20-12.5 MG tablet Take 1 tablet by mouth daily. 03/16/21  Yes Sowles, Drue Stager, MD  meloxicam (MOBIC) 7.5 MG tablet Take 7.5 mg by mouth daily. 02/19/19  Yes Behalal-Bock, Christele, MD  pravastatin (PRAVACHOL) 20 MG tablet TAKE 1 TABLET BY MOUTH ONCE DAILY WITH SUPPER 04/06/21  Yes Sowles, Drue Stager, MD  tiZANidine (ZANAFLEX) 2 MG tablet Take 2 mg by mouth every 12 (twelve) hours.   Yes Allyne Gee, MD  traMADol (ULTRAM) 50 MG tablet Take by mouth every 12 (twelve) hours as needed.   Yes Allyne Gee, MD  Vitamin D, Ergocalciferol, (DRISDOL) 1.25 MG (50000 UNIT) CAPS capsule Take 1 capsule (50,000 Units total) by mouth every 7 (seven) days. 03/16/21  Yes Sowles, Drue Stager, MD  rivaroxaban (XARELTO) 20 MG TABS tablet Take 1 tablet (20 mg total) by mouth daily with supper. Patient not taking: Reported on 04/20/2021 03/16/21   [name], MD    Inpatient Medications: Scheduled Meds:  albuterol  2 puff Inhalation Q6H   vitamin C  500 mg Oral Daily   diltiazem  30 mg Oral Q6H   insulin aspart  0-5 Units Subcutaneous QHS   insulin aspart  0-9 Units Subcutaneous TID WC   zinc sulfate  220 mg Oral Daily   Continuous Infusions:  diltiazem (CARDIZEM) infusion Stopped (04/21/21 0143)   heparin 950 Units/hr (04/21/21 0610)   remdesivir 100 mg in NS 100 mL     PRN Meds: acetaminophen **OR** acetaminophen, chlorpheniramine-HYDROcodone, guaiFENesin-dextromethorphan, ondansetron **OR** ondansetron (ZOFRAN) IV  Allergies:     Allergies  Allergen Reactions   Penicillins Hives and Swelling    As a child. Has patient had a PCN reaction causing immediate rash, facial/tongue/throat swelling, SOB or lightheadedness with hypotension: YES  Has patient had a PCN reaction causing severe rash involving mucus membranes or skin necrosis: UNKNOWN Has patient had a PCN reaction that required hospitalization: UNKNOWN Has patient had a PCN reaction occurring within the last 10 years: NO   Aspirin Nausea Only   Ibuprofen Nausea Only   Lyrica [Pregabalin] Nausea Only   Acetaminophen Nausea Only    Social History:   Social History   Socioeconomic History   Marital status: Divorced    Spouse name: Not on file   Number of children: 2   Years of education: Not on file   Highest education level: 12th grade  Occupational History   Occupation: Disabled  Tobacco Use   Smoking status: Every Day    Packs/day: 1.50    Years: 46.00    Pack years: 69.00  Types: Cigarettes   Smokeless tobacco: Never  Vaping Use   Vaping Use: Never used  Substance and Sexual Activity   Alcohol use: No    Alcohol/week: 0.0 standard drinks   Drug use: No   Sexual activity: Not Currently  Other Topics Concern   Not on file  Social History Narrative   Lives alone   Social Determinants of Health   Financial Resource Strain: Low Risk    Difficulty of Paying Living Expenses: Not hard at all  Food Insecurity: No Food Insecurity   Worried About Charity fundraiser in the Last Year: Never true   Ran Out of Food in the Last Year: Never true  Transportation Needs: No Transportation Needs   Lack of Transportation (Medical): No   Lack of Transportation (Non-Medical): No  Physical Activity: Insufficiently Active   Days of Exercise per Week: 2 days   Minutes of Exercise per Session: 20 min  Stress: No Stress Concern Present   Feeling of Stress : Not at all  Social Connections: Socially Isolated   Frequency of Communication with Friends and  Family: More than three times a week   Frequency of Social Gatherings with Friends and Family: Three times a week   Attends Religious Services: Never   Active Member of Clubs or Organizations: No   Attends Music therapist: Never   Marital Status: Divorced  Human resources officer Violence: Not At Risk   Fear of Current or Ex-Partner: No   Emotionally Abused: No   Physically Abused: No   Sexually Abused: No    Family History:    Family History  Problem Relation Age of Onset   Heart failure Mother    Hypertension Mother    Diverticulitis Mother    Colitis Mother    Diabetes Father    Hypertension Father    Heart disease Father    Diabetes Sister    Diverticulitis Brother      ROS:  Please see the history of present illness.   All other ROS reviewed and negative.     Physical Exam/Data:   Vitals:   04/20/21 1943 04/20/21 2356 04/21/21 0415 04/21/21 0700  BP: (!) 150/69 131/87 125/84 132/81  Pulse: 84 78 82 79  Resp: '20 16 14 14  '$ Temp: 97.9 F (36.6 C) 98.9 F (37.2 C) 98.4 F (36.9 C) 98 F (36.7 C)  TempSrc: Oral  Oral Oral  SpO2: 100% 96% 96% 97%  Weight:      Height:        Intake/Output Summary (Last 24 hours) at 04/21/2021 0816 Last data filed at 04/21/2021 0528 Gross per 24 hour  Intake 608.53 ml  Output 325 ml  Net 283.53 ml   Last 3 Weights 04/19/2021 03/16/2021 09/15/2020  Weight (lbs) 165 lb 165 lb 162 lb 6.4 oz  Weight (kg) 74.844 kg 74.844 kg 73.664 kg     Body mass index is 23.01 kg/m.   VITAL SIGNS:  reviewed  EKG:  The EKG was personally reviewed and demonstrates:  Afib RVR 145bpm Telemetry:  Telemetry was personally reviewed and demonstrates:  Afib HR 80-90s, PVCs  Relevant CV Studies:  Echo 04/20/21  1. Normal LVF.   2. Left ventricular ejection fraction, by estimation, is 55 to 60%. Left  ventricular ejection fraction by PLAX is 55 %. The left ventricle has  normal function. The left ventricle has no regional wall motion   abnormalities. Left ventricular diastolic  function could not be evaluated.  3. Right ventricular systolic function is normal. The right ventricular  size is normal.   4. The mitral valve is grossly normal. Trivial mitral valve  regurgitation.   5. The aortic valve is normal in structure. Aortic valve regurgitation is  not visualized.   Conclusion(s)/Recommendation(s): Normal biventricular function without  evidence of hemodynamically significant valvular heart disease.  Laboratory Data:  Chemistry Recent Labs  Lab 04/19/21 2204  NA 135  K 4.5  CL 93*  CO2 22  GLUCOSE 132*  BUN 26*  CREATININE 1.34*  CALCIUM 9.6  GFRNONAA 58*  ANIONGAP 20*    No results for input(s): PROT, ALBUMIN, AST, ALT, ALKPHOS, BILITOT in the last 168 hours. Hematology Recent Labs  Lab 04/19/21 2204 04/21/21 0035  WBC 7.2 5.2  RBC 6.20* 5.01  HGB 19.5* 16.3  HCT 55.2* 44.5  MCV 89.0 88.8  MCH 31.5 32.5  MCHC 35.3 36.6*  RDW 12.6 12.3  PLT 159 137*   Cardiac EnzymesNo results for input(s): TROPONINI in the last 168 hours. No results for input(s): TROPIPOC in the last 168 hours.  BNPNo results for input(s): BNP, PROBNP in the last 168 hours.  DDimer No results for input(s): DDIMER in the last 168 hours.  Radiology/Studies:  DG Chest 2 View  Result Date: 04/19/2021 CLINICAL DATA:  Shortness of breath.  Weakness and nausea. EXAM: CHEST - 2 VIEW COMPARISON:  Remote radiograph 12/11/2007 FINDINGS: Chronic hyperinflation. Minimal bronchial thickening. No focal airspace disease. Normal heart size and mediastinal contours. Aortic atherosclerosis. Minimal biapical pleuroparenchymal scarring. No pleural effusion. No pneumothorax. No pulmonary edema. Remote left rib fractures. Slight anterior wedging of midthoracic vertebra, age indeterminate. IMPRESSION: Chronic hyperinflation and bronchial thickening, imaging findings consistent with COPD. No focal airspace disease. Electronically Signed   By:  Keith Rake M.D.   On: 04/19/2021 22:33   ECHOCARDIOGRAM COMPLETE  Result Date: 04/20/2021    ECHOCARDIOGRAM REPORT   Patient Name:   Mark Nash Osberg Date of Exam: 04/20/2021 Medical Rec #:  [id number]       Height:       71.0 in Accession #:    [id number]      Weight:       165.0 lb Date of Birth:  December 09, 1955       BSA:          1.943 m Patient Age:    30 years        BP:           124/88 mmHg Patient Gender: M               HR:           103 bpm. Exam Location:  [facility] Procedure: 2D Echo, Cardiac Doppler and Color Doppler Indications:     Atrial Fibrillation I48.91  History:         Patient has no prior history of Echocardiogram examinations.                  COPD, Signs/Symptoms:Dyspnea; Risk Factors:Hypertension.  Sonographer:     Sherrie Sport Referring Phys:  [id number] Athena Masse Diagnosing Phys: Yolonda Kida MD  Sonographer Comments: Technically challenging study due to limited acoustic windows, no apical window and no parasternal window. Image acquisition challenging due to COPD and Subcostal was the only view obtainable. IMPRESSIONS  1. Normal LVF.  2. Left ventricular ejection fraction, by estimation, is 55 to 60%. Left ventricular ejection fraction by PLAX is 55 %. The left ventricle  has normal function. The left ventricle has no regional wall motion abnormalities. Left ventricular diastolic function could not be evaluated.  3. Right ventricular systolic function is normal. The right ventricular size is normal.  4. The mitral valve is grossly normal. Trivial mitral valve regurgitation.  5. The aortic valve is normal in structure. Aortic valve regurgitation is not visualized. Conclusion(s)/Recommendation(s): Normal biventricular function without evidence of hemodynamically significant valvular heart disease. FINDINGS  Left Ventricle: Left ventricular ejection fraction, by estimation, is 55 to 60%. Left ventricular ejection fraction by PLAX is 55 %. The left ventricle has normal function. The left  ventricle has no regional wall motion abnormalities. The left ventricular internal cavity size was normal in size. There is no left ventricular hypertrophy. Left ventricular diastolic function could not be evaluated. Right Ventricle: The right ventricular size is normal. No increase in right ventricular wall thickness. Right ventricular systolic function is normal. Left Atrium: Left atrial size was normal in size. Right Atrium: Right atrial size was normal in size. Pericardium: There is no evidence of pericardial effusion. Mitral Valve: The mitral valve is grossly normal. Trivial mitral valve regurgitation. Tricuspid Valve: The tricuspid valve is normal in structure. Tricuspid valve regurgitation is mild. Aortic Valve: The aortic valve is normal in structure. Aortic valve regurgitation is not visualized. Pulmonic Valve: The pulmonic valve was normal in structure. Pulmonic valve regurgitation is not visualized. Aorta: The ascending aorta was not well visualized. IAS/Shunts: The interatrial septum was not well visualized. Additional Comments: Normal LVF.  LEFT VENTRICLE PLAX 2D LV EF:         Left ventricular ejection fraction by PLAX is 55 %. LVIDd:         3.90 cm LVIDs:         2.80 cm LV IVS:        1.20 cm  LEFT ATRIUM            Index       RIGHT ATRIUM           Index LA diam:      5.10 cm  2.62 cm/m  RA Area:     17.90 cm LA Vol (A4C): 101.0 ml 51.98 ml/m RA Volume:   52.10 ml  26.81 ml/m  PULMONIC VALVE PV Vmax:        1.14 m/s PV Peak grad:   5.2 mmHg RVOT Peak grad: 8 mmHg  Dwayne D Callwood MD Electronically signed by Yolonda Kida MD Signature Date/Time: 04/20/2021/2:22:56 PM    Final     '@COVIDRISKCOMPLICATION'$ @   Assessment and Plan:   Afib RVR - diagnosed by PCP 02/2021 and started on Xarelto but a/c was not started by patient - presents with Afib RVR in the setting of COVID infection - Rates better on IV cardizem, now 80-90s - Continue Xarelto for CHADSVASC of at least 3 (HTN, PAD,  DM2) - Started on IV heparin>>can transition to DOAC - Echo showed preserved EF, 55-60%, no WMA, normal RV function, trivial MR - cardizem transitioned to PO cardizem '30mg'$  q6hours>>will consolidate - Can continue 3-4 weeks of a/c followed by cardioversion as OP. Compliance with blood thinner encouraged  COVID infection - Remdesivir, albuterol, antitussives, vitamins - CXR with no acute infection - per IM  AKI - 1.34 on admission - s/p IVF in the ER - check BMET   COPD/tobacco use - duonebs per primary team  HTN - home meds lisinopril-HCTZ 20-12.'5mg'$  daily held for dilt gtt - BPS intermittently elevated  HLD/PAD - LDL 54 in 2022 - PTA pravastatin '20mg'$  daily>>continue  DM2 - SSI per primary team   For questions or updates, please contact [place] Please consult [email or web address] for contact info under     Signed, Mekenna Finau Ninfa Meeker, PA-C  04/21/2021 8:16 AM

## 2021-04-21 NOTE — Consult Note (Signed)
ANTICOAGULATION CONSULT NOTE  Pharmacy Consult for Heparin Indication: atrial fibrillation  Allergies  Allergen Reactions   Penicillins Hives and Swelling    As a child. Has patient had a PCN reaction causing immediate rash, facial/tongue/throat swelling, SOB or lightheadedness with hypotension: YES  Has patient had a PCN reaction causing severe rash involving mucus membranes or skin necrosis: UNKNOWN Has patient had a PCN reaction that required hospitalization: UNKNOWN Has patient had a PCN reaction occurring within the last 10 years: NO   Aspirin Nausea Only   Ibuprofen Nausea Only   Lyrica [Pregabalin] Nausea Only   Acetaminophen Nausea Only    Patient Measurements: Height: '5\' 11"'$  (180.3 cm) Weight: 74.8 kg (165 lb) IBW/kg (Calculated) : 75.3 Heparin Dosing Weight: 74.8  Vital Signs: Temp: 98.4 F (36.9 C) (08/25 0415) Temp Source: Oral (08/25 0415) BP: 125/84 (08/25 0415) Pulse Rate: 82 (08/25 0415)  Labs: Recent Labs    04/19/21 2204 04/20/21 0131 04/20/21 0920 04/20/21 1602 04/21/21 0035 04/21/21 0657  HGB 19.5*  --   --   --  16.3  --   HCT 55.2*  --   --   --  44.5  --   PLT 159  --   --   --  137*  --   APTT  --   --  28  --   --   --   LABPROT  --   --  13.4  --   --   --   INR  --   --  1.0  --   --   --   HEPARINUNFRC  --   --   --  0.93* 0.33 0.30  CREATININE 1.34*  --   --   --   --   --   CKTOTAL 175  --   --   --   --   --   TROPONINIHS 14 11  --   --   --   --     Baseline labs aPTT : pending (ordered) PT/INR: pending (ordered)  Estimated Creatinine Clearance: 57.4 mL/min (A) (by C-G formula based on SCr of 1.34 mg/dL (H)).   Medical History: Past Medical History:  Diagnosis Date   Arthritis    Asthma    Benign neoplasm of ascending colon    polyps   Constipation    COPD (chronic obstructive pulmonary disease) ([place])    NO INHALER   Diabetes mellitus without complication (HCC)    Type 2   Diverticulosis    Dyspnea    RARE-WITH  EXERTION DUE TO COPD   GERD (gastroesophageal reflux disease)    Hepatitis C    History of kidney stones    H/O   Hyperlipidemia    Hypertension 06/10/2018   currently not under control   Left inguinal hernia    Peripheral vascular disease (HCC)    Restless legs    Schizoaffective disorder (HCC)    Pt denies   Thrombocytopenia (HCC)    Vitamin D deficiency     Assessment:  Patient presented to Emergency department increased fatigue, cough, and SOB.  Noted recent dx of Atrial fibrillation , but prescription for Xarelto was never started by patient.   PMH includes COPD, HTN, DM ,and PAD. Noted patient also COVID positive. ECHO pending. Pharmacy was consulted to management of heparin for A.Fib.    8/24 1602 HL 0.93, supratherapeutic 8/25 0035 HL 0.33, therapeutic x 1 8/25 0657 HL 0.30, therapeutic x 2   Goal  of Therapy:  Heparin level 0.3-0.7 units/ml Monitor platelets by anticoagulation protocol: Yes   Plan:  Heparin level therapeutic Continue heparin infusion at 950 units/hr Recheck HL with AM labs CBC daily while on heparin infusion  Sherilyn Banker, PharmD 04/21/2021 7:40 AM

## 2021-04-22 DIAGNOSIS — U071 COVID-19: Secondary | ICD-10-CM | POA: Diagnosis not present

## 2021-04-22 DIAGNOSIS — L8990 Pressure ulcer of unspecified site, unspecified stage: Secondary | ICD-10-CM | POA: Insufficient documentation

## 2021-04-22 DIAGNOSIS — J449 Chronic obstructive pulmonary disease, unspecified: Secondary | ICD-10-CM | POA: Diagnosis not present

## 2021-04-22 DIAGNOSIS — E1151 Type 2 diabetes mellitus with diabetic peripheral angiopathy without gangrene: Secondary | ICD-10-CM | POA: Diagnosis not present

## 2021-04-22 DIAGNOSIS — I4891 Unspecified atrial fibrillation: Secondary | ICD-10-CM | POA: Diagnosis not present

## 2021-04-22 LAB — CBC
HCT: 44.3 % (ref 39.0–52.0)
Hemoglobin: 15.9 g/dL (ref 13.0–17.0)
MCH: 32 pg (ref 26.0–34.0)
MCHC: 35.9 g/dL (ref 30.0–36.0)
MCV: 89.1 fL (ref 80.0–100.0)
Platelets: 132 10*3/uL — ABNORMAL LOW (ref 150–400)
RBC: 4.97 MIL/uL (ref 4.22–5.81)
RDW: 12.3 % (ref 11.5–15.5)
WBC: 5.3 10*3/uL (ref 4.0–10.5)
nRBC: 0 % (ref 0.0–0.2)

## 2021-04-22 LAB — GLUCOSE, CAPILLARY: Glucose-Capillary: 143 mg/dL — ABNORMAL HIGH (ref 70–99)

## 2021-04-22 MED ORDER — DILTIAZEM HCL ER COATED BEADS 120 MG PO CP24
240.0000 mg | ORAL_CAPSULE | Freq: Every day | ORAL | Status: DC
  Administered 2021-04-22: 240 mg via ORAL
  Filled 2021-04-22: qty 2

## 2021-04-22 MED ORDER — RIVAROXABAN 20 MG PO TABS: 20.0000 mg | ORAL_TABLET | Freq: Every day | ORAL | 0 refills | Status: DC

## 2021-04-22 MED ORDER — DILTIAZEM HCL ER 240 MG PO CP24: 240.0000 mg | ORAL_CAPSULE | Freq: Every day | ORAL | 0 refills | Status: DC

## 2021-04-22 NOTE — Plan of Care (Signed)
  Problem: Clinical Measurements: Goal: Ability to maintain clinical measurements within normal limits will improve Outcome: Progressing   Problem: Clinical Measurements: Goal: Will remain free from infection Outcome: Progressing   Problem: Clinical Measurements: Goal: Diagnostic test results will improve Outcome: Progressing   Problem: Clinical Measurements: Goal: Respiratory complications will improve Outcome: Progressing   

## 2021-04-22 NOTE — Discharge Summary (Signed)
Physician Discharge Summary  DARIN CAPPO [id number] DOB: [birth year] DOA: 04/19/2021  PCP: [name], MD  Admit date: 04/19/2021 Discharge date: 04/22/2021  Admitted From: Home Disposition: Home  Recommendations for Outpatient Follow-up:  Follow up with PCP in 1-2 weeks Follow-up with cardiology as directed  Home Health: No Equipment/Devices: None  Discharge Condition: Stable CODE STATUS: Full Diet recommendation: Heart Healthy  Brief/Interim Summary:  65 year old male with history significant for COPD, hypertension, diabetes, peripheral arterial disease who presents to the ED with 3-day complaint of nausea, decreased oral intake, muscle cramps.  Also has shortness of breath.  He attributes his symptoms to receiving the COVID and shingles vaccine on 8/20. Recently diagnosed with new onset atrial fibrillation in PCP office on 7/20.  Was started on Xarelto for CHA2DS2-VASc of 4 but however did not start the medication as the pharmacist told him it would interact with the Mobic he was on.   On arrival patient was found to be in atrial fibrillation with rapid ventricular response.  Rates of 145.  Started on diltiazem gtt. with good response.  Patient was also found to be COVID-positive.   Heart rate improved.  Cardiology consulted.  Diltiazem consolidated to once daily CD dosing.  Occasional tachycardia associated with coughing fits.  Overall stable.  On day of discharge coughing is improved.  Patient is stable.  Heart rate improved.  Rate control consolidated to Cardizem CD 240 mg daily.  Patient counseled on Xarelto resumption.  He expressed understanding.  He is discharged home and will follow up with cardiology as outpatient.  After 1 month of anticoagulation can consider outpatient cardioversion  Discharge Diagnoses:  Principal Problem:   Rapid atrial fibrillation ([place]) Active Problems:   Chronic obstructive pulmonary disease (HCC)   Essential (primary) hypertension    Diabetes mellitus type 2 with peripheral artery disease (HCC)   AKI (acute kidney injury) ([place])   Pressure injury of skin  Atrial fibrillation with rapid ventricular response Diltiazem drip with good response started in ED CHA2DS2-VASc of 3 Required diltiazem gtt. Weaned off and rate control consolidated to Cardizem CD 240 mg daily Will discharge on this dose Restart home Xarelto   COVID infection Mildly symptomatic with nasal congestion, cough, shortness of breath, muscle aches and fatigue No pneumonia, fever, oxygen requirement Plan: Remdesivir x3-day course, completed in house Airborne precautions Defer steroids   Acute kidney injury Improved after IV fluid bolus   COPD Not acutely exacerbated    Essential hypertension Currently on hold while on diltiazem Home lisinopril/hydrochlorothiazide has been held to allow blood pressure room for long-acting Cardizem   Type 2 diabetes mellitus with peripheral arterial disease Resume home regimen PTA pravastatin   Osteoarthritis Continue Neurontin and Zanaflex Hold Mobic and discontinue on discharge  Discharge Instructions  Discharge Instructions     Amb referral to [facility]   Complete by: As directed    Diet - low sodium heart healthy   Complete by: As directed    Increase activity slowly   Complete by: As directed    No wound care   Complete by: As directed       Allergies as of 04/22/2021       Reactions   Penicillins Hives, Swelling   As a child. Has patient had a PCN reaction causing immediate rash, facial/tongue/throat swelling, SOB or lightheadedness with hypotension: YES  Has patient had a PCN reaction causing severe rash involving mucus membranes or skin necrosis: UNKNOWN Has patient had a PCN reaction that  required hospitalization: UNKNOWN Has patient had a PCN reaction occurring within the last 10 years: NO   Aspirin Nausea Only   Ibuprofen Nausea Only   Lyrica [pregabalin] Nausea Only    Acetaminophen Nausea Only        Medication List     STOP taking these medications    lisinopril-hydrochlorothiazide 20-12.5 MG tablet Commonly known as: ZESTORETIC   meloxicam 7.5 MG tablet Commonly known as: MOBIC       TAKE these medications    diltiazem 240 MG 24 hr capsule Commonly known as: DILACOR XR Take 1 capsule (240 mg total) by mouth daily.   gabapentin 300 MG capsule Commonly known as: NEURONTIN Take 300 mg by mouth in the morning, at noon, and at bedtime.   insulin detemir 100 UNIT/ML injection Commonly known as: Levemir Inject 0.06 mLs (6 Units total) into the skin at bedtime.   pravastatin 20 MG tablet Commonly known as: PRAVACHOL TAKE 1 TABLET BY MOUTH ONCE DAILY WITH SUPPER   rivaroxaban 20 MG Tabs tablet Commonly known as: XARELTO Take 1 tablet (20 mg total) by mouth daily with supper.   tiZANidine 2 MG tablet Commonly known as: ZANAFLEX Take 2 mg by mouth every 12 (twelve) hours.   traMADol 50 MG tablet Commonly known as: ULTRAM Take by mouth every 12 (twelve) hours as needed.   Trelegy Ellipta 100-62.5-25 MCG/INH Aepb Generic drug: Fluticasone-Umeclidin-Vilant Take 1 puff by mouth every evening.   Vitamin D (Ergocalciferol) 1.25 MG (50000 UNIT) Caps capsule Commonly known as: DRISDOL Take 1 capsule (50,000 Units total) by mouth every 7 (seven) days.        Allergies  Allergen Reactions   Penicillins Hives and Swelling    As a child. Has patient had a PCN reaction causing immediate rash, facial/tongue/throat swelling, SOB or lightheadedness with hypotension: YES  Has patient had a PCN reaction causing severe rash involving mucus membranes or skin necrosis: UNKNOWN Has patient had a PCN reaction that required hospitalization: UNKNOWN Has patient had a PCN reaction occurring within the last 10 years: NO   Aspirin Nausea Only   Ibuprofen Nausea Only   Lyrica [Pregabalin] Nausea Only   Acetaminophen Nausea Only     Consultations: Cardiology-[facility]   Procedures/Studies: DG Chest 2 View  Result Date: 04/19/2021 CLINICAL DATA:  Shortness of breath.  Weakness and nausea. EXAM: CHEST - 2 VIEW COMPARISON:  Remote radiograph 12/11/2007 FINDINGS: Chronic hyperinflation. Minimal bronchial thickening. No focal airspace disease. Normal heart size and mediastinal contours. Aortic atherosclerosis. Minimal biapical pleuroparenchymal scarring. No pleural effusion. No pneumothorax. No pulmonary edema. Remote left rib fractures. Slight anterior wedging of midthoracic vertebra, age indeterminate. IMPRESSION: Chronic hyperinflation and bronchial thickening, imaging findings consistent with COPD. No focal airspace disease. Electronically Signed   By: Keith Rake M.D.   On: 04/19/2021 22:33   ECHOCARDIOGRAM COMPLETE  Result Date: 04/20/2021    ECHOCARDIOGRAM REPORT   Patient Name:   STALIN HOULTON Stander Date of Exam: 04/20/2021 Medical Rec #:  [id number]       Height:       71.0 in Accession #:    [id number]      Weight:       165.0 lb Date of Birth:  07-31-1956       BSA:          1.943 m Patient Age:    65 years        BP:           124/88  mmHg Patient Gender: M               HR:           103 bpm. Exam Location:  [facility] Procedure: 2D Echo, Cardiac Doppler and Color Doppler Indications:     Atrial Fibrillation I48.91  History:         Patient has no prior history of Echocardiogram examinations.                  COPD, Signs/Symptoms:Dyspnea; Risk Factors:Hypertension.  Sonographer:     Sherrie Sport Referring Phys:  [id number] Athena Masse Diagnosing Phys: Yolonda Kida MD  Sonographer Comments: Technically challenging study due to limited acoustic windows, no apical window and no parasternal window. Image acquisition challenging due to COPD and Subcostal was the only view obtainable. IMPRESSIONS  1. Normal LVF.  2. Left ventricular ejection fraction, by estimation, is 55 to 60%. Left ventricular ejection fraction by PLAX is 55 %. The  left ventricle has normal function. The left ventricle has no regional wall motion abnormalities. Left ventricular diastolic function could not be evaluated.  3. Right ventricular systolic function is normal. The right ventricular size is normal.  4. The mitral valve is grossly normal. Trivial mitral valve regurgitation.  5. The aortic valve is normal in structure. Aortic valve regurgitation is not visualized. Conclusion(s)/Recommendation(s): Normal biventricular function without evidence of hemodynamically significant valvular heart disease. FINDINGS  Left Ventricle: Left ventricular ejection fraction, by estimation, is 55 to 60%. Left ventricular ejection fraction by PLAX is 55 %. The left ventricle has normal function. The left ventricle has no regional wall motion abnormalities. The left ventricular internal cavity size was normal in size. There is no left ventricular hypertrophy. Left ventricular diastolic function could not be evaluated. Right Ventricle: The right ventricular size is normal. No increase in right ventricular wall thickness. Right ventricular systolic function is normal. Left Atrium: Left atrial size was normal in size. Right Atrium: Right atrial size was normal in size. Pericardium: There is no evidence of pericardial effusion. Mitral Valve: The mitral valve is grossly normal. Trivial mitral valve regurgitation. Tricuspid Valve: The tricuspid valve is normal in structure. Tricuspid valve regurgitation is mild. Aortic Valve: The aortic valve is normal in structure. Aortic valve regurgitation is not visualized. Pulmonic Valve: The pulmonic valve was normal in structure. Pulmonic valve regurgitation is not visualized. Aorta: The ascending aorta was not well visualized. IAS/Shunts: The interatrial septum was not well visualized. Additional Comments: Normal LVF.  LEFT VENTRICLE PLAX 2D LV EF:         Left ventricular ejection fraction by PLAX is 55 %. LVIDd:         3.90 cm LVIDs:         2.80 cm LV  IVS:        1.20 cm  LEFT ATRIUM            Index       RIGHT ATRIUM           Index LA diam:      5.10 cm  2.62 cm/m  RA Area:     17.90 cm LA Vol (A4C): 101.0 ml 51.98 ml/m RA Volume:   52.10 ml  26.81 ml/m  PULMONIC VALVE PV Vmax:        1.14 m/s PV Peak grad:   5.2 mmHg RVOT Peak grad: 8 mmHg  Dwayne Prince Rome MD Electronically signed by Yolonda Kida MD Signature Date/Time: 04/20/2021/2:22:56  PM    Final    (Echo, Carotid, EGD, Colonoscopy, ERCP)    Subjective: Seen and examined on the day of discharge.  Stable in no distress.  Stable for discharge home.  Discharge Exam: Vitals:   04/22/21 0435 04/22/21 0905  BP: 136/71 133/87  Pulse: 84 (!) 102  Resp:  15  Temp:  98.3 F (36.8 C)  SpO2: 97% 100%   Vitals:   04/22/21 0004 04/22/21 0435 04/22/21 0435 04/22/21 0905  BP: (!) 142/99  136/71 133/87  Pulse: 89  84 (!) 102  Resp:    15  Temp:  97.9 F (36.6 C)  98.3 F (36.8 C)  TempSrc:  Oral    SpO2: 99%  97% 100%  Weight:      Height:        General: Pt is alert, awake, not in acute distress Cardiovascular: Regular rate, irregular rhythm, no murmurs, no pedal edema Respiratory: CTA bilaterally, no wheezing, no rhonchi Abdominal: Soft, NT, ND, bowel sounds + Extremities: no edema, no cyanosis    The results of significant diagnostics from this hospitalization (including imaging, microbiology, ancillary and laboratory) are listed below for reference.     Microbiology: Recent Results (from the past 240 hour(s))  Resp Panel by RT-PCR (Flu A&B, Covid) Nasopharyngeal Swab     Status: Abnormal   Collection Time: 04/19/21 11:26 PM   Specimen: Nasopharyngeal Swab; Nasopharyngeal(NP) swabs in vial transport medium  Result Value Ref Range Status   SARS Coronavirus 2 by RT PCR POSITIVE (A) NEGATIVE Final    Comment: RESULT CALLED TO, READ BACK BY AND VERIFIED WITH: RAQUEL DAVID '@0105'$  ON 04/20/21 SKL (NOTE) SARS-CoV-2 target nucleic acids are DETECTED.  The SARS-CoV-2  RNA is generally detectable in upper respiratory specimens during the acute phase of infection. Positive results are indicative of the presence of the identified virus, but do not rule out bacterial infection or co-infection with other pathogens not detected by the test. Clinical correlation with patient history and other diagnostic information is necessary to determine patient infection status. The expected result is Negative.  Fact Sheet for Patients: EntrepreneurPulse.com.au  Fact Sheet for Healthcare Providers: IncredibleEmployment.be  This test is not yet approved or cleared by the [place] FDA and  has been authorized for detection and/or diagnosis of SARS-CoV-2 by FDA under an Emergency Use Authorization (EUA).  This EUA will remain in effect (meaning this test can be  used) for the duration of  the COVID-19 declaration under Section 564(b)(1) of the Act, 21 U.S.C. section 360bbb-3(b)(1), unless the authorization is terminated or revoked sooner.     Influenza A by PCR NEGATIVE NEGATIVE Final   Influenza B by PCR NEGATIVE NEGATIVE Final    Comment: (NOTE) The Xpert Xpress SARS-CoV-2/FLU/RSV plus assay is intended as an aid in the diagnosis of influenza from Nasopharyngeal swab specimens and should not be used as a sole basis for treatment. Nasal washings and aspirates are unacceptable for Xpert Xpress SARS-CoV-2/FLU/RSV testing.  Fact Sheet for Patients: EntrepreneurPulse.com.au  Fact Sheet for Healthcare Providers: IncredibleEmployment.be  This test is not yet approved or cleared by the [place] FDA and has been authorized for detection and/or diagnosis of SARS-CoV-2 by FDA under an Emergency Use Authorization (EUA). This EUA will remain in effect (meaning this test can be used) for the duration of the COVID-19 declaration under Section 564(b)(1) of the Act, 21 U.S.C. section  360bbb-3(b)(1), unless the authorization is terminated or revoked.  Performed at [facility], [phone] [place], [place] [address]      Labs: BNP (last 3 results) No results for input(s): BNP in the last 8760 hours. Basic Metabolic Panel: Recent Labs  Lab 04/19/21 2204 04/21/21 0035  NA 135 134*  K 4.5 3.8  CL 93* 101  CO2 22 22  GLUCOSE 132* 142*  BUN 26* 16  CREATININE 1.34* 0.70  CALCIUM 9.6 8.7*  MG 2.0  --    Liver Function Tests: No results for input(s): AST, ALT, ALKPHOS, BILITOT, PROT, ALBUMIN in the last 168 hours. No results for input(s): LIPASE, AMYLASE in the last 168 hours. No results for input(s): AMMONIA in the last 168 hours. CBC: Recent Labs  Lab 04/19/21 2204 04/21/21 0035 04/22/21 0456  WBC 7.2 5.2 5.3  HGB 19.5* 16.3 15.9  HCT 55.2* 44.5 44.3  MCV 89.0 88.8 89.1  PLT 159 137* 132*   Cardiac Enzymes: Recent Labs  Lab 04/19/21 2204  CKTOTAL 175   BNP: Invalid input(s): POCBNP CBG: Recent Labs  Lab 04/21/21 0829 04/21/21 1238 04/21/21 1817 04/21/21 2013 04/22/21 0905  GLUCAP 114* 170* 133* 189* 143*   D-Dimer No results for input(s): DDIMER in the last 72 hours. Hgb A1c No results for input(s): HGBA1C in the last 72 hours. Lipid Profile No results for input(s): CHOL, HDL, LDLCALC, TRIG, CHOLHDL, LDLDIRECT in the last 72 hours. Thyroid function studies Recent Labs    04/19/21 2204  TSH 1.706   Anemia work up No results for input(s): VITAMINB12, FOLATE, FERRITIN, TIBC, IRON, RETICCTPCT in the last 72 hours. Urinalysis    Component Value Date/Time   COLORURINE YELLOW (A) 04/19/2021 2204   APPEARANCEUR HAZY (A) 04/19/2021 2204   LABSPEC 1.019 04/19/2021 2204   PHURINE 6.0 04/19/2021 2204   GLUCOSEU NEGATIVE 04/19/2021 2204   HGBUR MODERATE (A) 04/19/2021 2204   BILIRUBINUR NEGATIVE 04/19/2021 2204   KETONESUR 80 (A) 04/19/2021 2204   PROTEINUR 30 (A) 04/19/2021 2204   NITRITE NEGATIVE 04/19/2021 2204    LEUKOCYTESUR NEGATIVE 04/19/2021 2204   Sepsis Labs Invalid input(s): PROCALCITONIN,  WBC,  LACTICIDVEN Microbiology Recent Results (from the past 240 hour(s))  Resp Panel by RT-PCR (Flu A&B, Covid) Nasopharyngeal Swab     Status: Abnormal   Collection Time: 04/19/21 11:26 PM   Specimen: Nasopharyngeal Swab; Nasopharyngeal(NP) swabs in vial transport medium  Result Value Ref Range Status   SARS Coronavirus 2 by RT PCR POSITIVE (A) NEGATIVE Final    Comment: RESULT CALLED TO, READ BACK BY AND VERIFIED WITH: RAQUEL DAVID '@0105'$  ON 04/20/21 SKL (NOTE) SARS-CoV-2 target nucleic acids are DETECTED.  The SARS-CoV-2 RNA is generally detectable in upper respiratory specimens during the acute phase of infection. Positive results are indicative of the presence of the identified virus, but do not rule out bacterial infection or co-infection with other pathogens not detected by the test. Clinical correlation with patient history and other diagnostic information is necessary to determine patient infection status. The expected result is Negative.  Fact Sheet for Patients: EntrepreneurPulse.com.au  Fact Sheet for Healthcare Providers: IncredibleEmployment.be  This test is not yet approved or cleared by the [place] FDA and  has been authorized for detection and/or diagnosis of SARS-CoV-2 by FDA under an Emergency Use Authorization (EUA).  This EUA will remain in effect (meaning this test can be  used) for the duration of  the COVID-19 declaration under Section 564(b)(1) of the Act, 21 U.S.C. section 360bbb-3(b)(1), unless the authorization is terminated or revoked sooner.  Influenza A by PCR NEGATIVE NEGATIVE Final   Influenza B by PCR NEGATIVE NEGATIVE Final    Comment: (NOTE) The Xpert Xpress SARS-CoV-2/FLU/RSV plus assay is intended as an aid in the diagnosis of influenza from Nasopharyngeal swab specimens and should not be used as a sole  basis for treatment. Nasal washings and aspirates are unacceptable for Xpert Xpress SARS-CoV-2/FLU/RSV testing.  Fact Sheet for Patients: EntrepreneurPulse.com.au  Fact Sheet for Healthcare Providers: IncredibleEmployment.be  This test is not yet approved or cleared by the [place] FDA and has been authorized for detection and/or diagnosis of SARS-CoV-2 by FDA under an Emergency Use Authorization (EUA). This EUA will remain in effect (meaning this test can be used) for the duration of the COVID-19 declaration under Section 564(b)(1) of the Act, 21 U.S.C. section 360bbb-3(b)(1), unless the authorization is terminated or revoked.  Performed at [facility], [address] [place], [place] [address]      Time coordinating discharge: Over 30 minutes  SIGNED:   Sidney Ace, MD  [facility] 04/22/2021, 1:26 PM Pager   If 7PM-7AM, please contact night-coverage

## 2021-04-22 NOTE — Progress Notes (Signed)
Progress Note  Due to the COVID-19 pandemic, this visit was completed with telemedicine (audio/video) technology to reduce patient and provider exposure as well as to preserve personal protective equipment.   Patient Name: Mark Nash Date of Encounter: 04/22/2021  [facility] Cardiologist: New  Subjective   In rate controlled afib, HR 80-90s. Exam per MD  Inpatient Medications    Scheduled Meds:  albuterol  2 puff Inhalation Q6H   vitamin C  500 mg Oral Daily   diltiazem  240 mg Oral Daily   insulin aspart  0-5 Units Subcutaneous QHS   insulin aspart  0-9 Units Subcutaneous TID WC   pravastatin  20 mg Oral q1800   rivaroxaban  20 mg Oral Q supper   zinc sulfate  220 mg Oral Daily   Continuous Infusions:  remdesivir 100 mg in NS 100 mL 100 mg (04/21/21 0908)   PRN Meds: acetaminophen **OR** acetaminophen, chlorpheniramine-HYDROcodone, guaiFENesin-dextromethorphan, ondansetron **OR** ondansetron (ZOFRAN) IV   Vital Signs    Vitals:   04/22/21 0004 04/22/21 0004 04/22/21 0435 04/22/21 0435  BP:  (!) 142/99  136/71  Pulse:  89  84  Resp:      Temp: 98.1 F (36.7 C)  97.9 F (36.6 C)   TempSrc: Oral  Oral   SpO2:  99%  97%  Weight:      Height:        Intake/Output Summary (Last 24 hours) at 04/22/2021 0807 Last data filed at 04/22/2021 0400 Gross per 24 hour  Intake --  Output 925 ml  Net -925 ml   Last 3 Weights 04/19/2021 03/16/2021 09/15/2020  Weight (lbs) 165 lb 165 lb 162 lb 6.4 oz  Weight (kg) 74.844 kg 74.844 kg 73.664 kg      Telemetry     Afib HR 80-90s, frequent PVCs - Personally Reviewed  ECG    No new - Personally Reviewed  Physical Exam   Exam per MD given COVID status Vitals reviewed  GEN: No acute distress.   Neck: No JVD Cardiac: RRR, no murmurs, rubs, or gallops.  Respiratory: Clear to auscultation bilaterally. GI: Soft, nontender, non-distended  MS: No edema; No deformity. Neuro:  Nonfocal  Psych: Normal affect    Labs    High Sensitivity Troponin:   Recent Labs  Lab 04/19/21 2204 04/20/21 0131  TROPONINIHS 14 11      Chemistry Recent Labs  Lab 04/19/21 2204 04/21/21 0035  NA 135 134*  K 4.5 3.8  CL 93* 101  CO2 22 22  GLUCOSE 132* 142*  BUN 26* 16  CREATININE 1.34* 0.70  CALCIUM 9.6 8.7*  GFRNONAA 58* >60  ANIONGAP 20* 11     Hematology Recent Labs  Lab 04/19/21 2204 04/21/21 0035 04/22/21 0456  WBC 7.2 5.2 5.3  RBC 6.20* 5.01 4.97  HGB 19.5* 16.3 15.9  HCT 55.2* 44.5 44.3  MCV 89.0 88.8 89.1  MCH 31.5 32.5 32.0  MCHC 35.3 36.6* 35.9  RDW 12.6 12.3 12.3  PLT 159 137* 132*    BNPNo results for input(s): BNP, PROBNP in the last 168 hours.   DDimer No results for input(s): DDIMER in the last 168 hours.   Radiology    ECHOCARDIOGRAM COMPLETE  Result Date: 04/20/2021    ECHOCARDIOGRAM REPORT   Patient Name:   Mark Nash Date of Exam: 04/20/2021 Medical Rec #:  [id number]       Height:       71.0 in Accession #:  [id number]      Weight:       165.0 lb Date of Birth:  April 28, 1956       BSA:          1.943 m Patient Age:    65 years        BP:           124/88 mmHg Patient Gender: M               HR:           103 bpm. Exam Location:  [facility] Procedure: 2D Echo, Cardiac Doppler and Color Doppler Indications:     Atrial Fibrillation I48.91  History:         Patient has no prior history of Echocardiogram examinations.                  COPD, Signs/Symptoms:Dyspnea; Risk Factors:Hypertension.  Sonographer:     Sherrie Sport Referring Phys:  [id number] Athena Masse Diagnosing Phys: Yolonda Kida MD  Sonographer Comments: Technically challenging study due to limited acoustic windows, no apical window and no parasternal window. Image acquisition challenging due to COPD and Subcostal was the only view obtainable. IMPRESSIONS  1. Normal LVF.  2. Left ventricular ejection fraction, by estimation, is 55 to 60%. Left ventricular ejection fraction by PLAX is 55 %. The left ventricle has  normal function. The left ventricle has no regional wall motion abnormalities. Left ventricular diastolic function could not be evaluated.  3. Right ventricular systolic function is normal. The right ventricular size is normal.  4. The mitral valve is grossly normal. Trivial mitral valve regurgitation.  5. The aortic valve is normal in structure. Aortic valve regurgitation is not visualized. Conclusion(s)/Recommendation(s): Normal biventricular function without evidence of hemodynamically significant valvular heart disease. FINDINGS  Left Ventricle: Left ventricular ejection fraction, by estimation, is 55 to 60%. Left ventricular ejection fraction by PLAX is 55 %. The left ventricle has normal function. The left ventricle has no regional wall motion abnormalities. The left ventricular internal cavity size was normal in size. There is no left ventricular hypertrophy. Left ventricular diastolic function could not be evaluated. Right Ventricle: The right ventricular size is normal. No increase in right ventricular wall thickness. Right ventricular systolic function is normal. Left Atrium: Left atrial size was normal in size. Right Atrium: Right atrial size was normal in size. Pericardium: There is no evidence of pericardial effusion. Mitral Valve: The mitral valve is grossly normal. Trivial mitral valve regurgitation. Tricuspid Valve: The tricuspid valve is normal in structure. Tricuspid valve regurgitation is mild. Aortic Valve: The aortic valve is normal in structure. Aortic valve regurgitation is not visualized. Pulmonic Valve: The pulmonic valve was normal in structure. Pulmonic valve regurgitation is not visualized. Aorta: The ascending aorta was not well visualized. IAS/Shunts: The interatrial septum was not well visualized. Additional Comments: Normal LVF.  LEFT VENTRICLE PLAX 2D LV EF:         Left ventricular ejection fraction by PLAX is 55 %. LVIDd:         3.90 cm LVIDs:         2.80 cm LV IVS:        1.20  cm  LEFT ATRIUM            Index       RIGHT ATRIUM           Index LA diam:      5.10 cm  2.62 cm/m  RA  Area:     17.90 cm LA Vol (A4C): 101.0 ml 51.98 ml/m RA Volume:   52.10 ml  26.81 ml/m  PULMONIC VALVE PV Vmax:        1.14 m/s PV Peak grad:   5.2 mmHg RVOT Peak grad: 8 mmHg  Yolonda Kida MD Electronically signed by Yolonda Kida MD Signature Date/Time: 04/20/2021/2:22:56 PM    Final     Cardiac Studies     Echo 04/20/21  1. Normal LVF.   2. Left ventricular ejection fraction, by estimation, is 55 to 60%. Left  ventricular ejection fraction by PLAX is 55 %. The left ventricle has  normal function. The left ventricle has no regional wall motion  abnormalities. Left ventricular diastolic  function could not be evaluated.   3. Right ventricular systolic function is normal. The right ventricular  size is normal.   4. The mitral valve is grossly normal. Trivial mitral valve  regurgitation.   5. The aortic valve is normal in structure. Aortic valve regurgitation is  not visualized.   Conclusion(s)/Recommendation(s): Normal biventricular function without  evidence of hemodynamically significant valvular heart disease.  Patient Profile     65 y.o. male with a hx of COPD, tobacco use, HTN, DM2, PAD, schizophrenia/schizoaffective disorder who is being seen today for the evaluation of Afib RVR.  Assessment & Plan   Afib RVR - diagnosed by PCP 02/2021 and started on Xarelto but a/c was not started by patient - presented with Afib RVR in the setting of COVID infection - CHADSVASC of at least 3 (HTN, PAD, DM2) - Continue Xarelto '20mg'$  daily - Echo showed preserved EF, 55-60%, no WMA, normal RV function, trivial MR - cardizem consolidated to '240mg'$  daily - Can continue 3-4 weeks of a/c followed by cardioversion as OP.    COVID infection - Remdesivir, albuterol, antitussives - CXR with no acute infection - per IM   AKI - 1.34 on admission - s/p IVF in the ER - Kidney  function improved   COPD/tobacco use - duonebs per primary team   HTN - home meds lisinopril-HCTZ 20-12.'5mg'$  daily held for dilt gtt - continue cardizem '240mg'$  daily - BPS intermittently elevated   HLD/PAD - LDL 54 in 2022 - PTA pravastatin '20mg'$  daily>>continue   DM2 - SSI per primary team    For questions or updates, please contact [facility] Please consult [email or web address] for contact info under        Signed, Zaliah Wissner Ninfa Meeker, PA-C  04/22/2021, 8:07 AM

## 2021-04-22 NOTE — Care Management Important Message (Signed)
Important Message  Patient Details  Name: Mark Nash MRN: [id number] Date of Birth: 1956/05/06   Medicare Important Message Given:  N/A - LOS <3 / Initial given by admissions     Dannette Barbara 04/22/2021, 1:36 PM

## 2021-04-25 ENCOUNTER — Telehealth: Payer: Self-pay

## 2021-04-25 NOTE — Telephone Encounter (Signed)
Transition Care Management Follow-up Telephone Call Date of discharge and from where: 04/22/21 [facility] How have you been since you were released from the hospital? Pt states he is doing okay  Any questions or concerns? No  Items Reviewed: Did the pt receive and understand the discharge instructions provided? Yes  Medications obtained and verified? Yes  Other? No  Any new allergies since your discharge? No  Dietary orders reviewed? Yes Do you have support at home? Yes   Home Care and Equipment/Supplies: Were home health services ordered? no  Were any new equipment or medical supplies ordered?  No  Functional Questionnaire: (I = Independent and D = Dependent) ADLs: I  Bathing/Dressing- I  Meal Prep- I  Eating- I  Maintaining continence- I  Transferring/Ambulation- I  Managing Meds- I  Follow up appointments reviewed:  PCP Hospital f/u appt confirmed? Yes  Scheduled to see Dr. Ancil Boozer on 04/28/21 @ 9:20. [facility] f/u appt confirmed? Yes  Scheduled to see cardiology on 05/06/21 @ 2:00. Are transportation arrangements needed? No  If their condition worsens, is the pt aware to call PCP or go to the Emergency Dept.? Yes Was the patient provided with contact information for the PCP's office or ED? Yes Was to pt encouraged to call back with questions or concerns? Yes

## 2021-04-27 NOTE — Progress Notes (Signed)
Name: Mark Nash   MRN: [id number]    DOB: 11-Mar-1956   Date:04/28/2021       Progress Note  Subjective  [facility] discharge follow up:  Admission date : 04/19/2021  Discharge date : 04/22/2021  Discharge diagnosis:  Rapid Afib COVID-19 infection Acute kidney disease   He had COVID-19 booster and shingrix vaccine 04/16/2021, and a few days later he developed rhinorrhea, nausea, fatigue, weakness and dizziness, also had lack of appetite and diarrhea with black stools. He has some coughing spells but has a history of COPD. He states felt so tired on 04/19/2021 and his niece took him to [facility], he arrived on rapid Afib with heart rate of 145 , was dehydrated and had acute kidney disease   . He was diagnosed with COVID-19, but CXR was negative for pneumonia   He was not taking Xarelto as prescribed in July for new onset afib due to drug drug interaction with meloxicam. He was advised by EC to stop meloxicam and stop lisinopril and take Cardizem and Xarelto. He has been tolerating medication well.   He is feeling well, no chest pain, palpitation, appetite is back to normal, no sob. Had one episode of black stools since discharge, bruising easily - reassurance given   Medication reconciliation was done   Patient Active Problem List   Diagnosis Date Noted   Rapid atrial fibrillation ([place]) 04/19/2021   Diabetes mellitus type 2 with peripheral artery disease ([place]) 03/16/2021   DDD (degenerative disc disease), lumbar 03/06/2019   Primary osteoarthritis involving multiple joints 03/06/2019   Lumbar radiculopathy 11/05/2018   Lumbar post-laminectomy syndrome 11/05/2018   Diabetic polyneuropathy associated with type 2 diabetes mellitus ([place]) 11/05/2018   CLE (columnar lined esophagus)    Gastric irritation    Duodenum ulcer    Personal history of colonic polyps    Polyp of sigmoid colon    Diverticulosis of large intestine without diverticulitis     Benign neoplasm of ascending colon    Osteoarthritis of both hands 09/04/2017   Chronic pain of left knee 09/04/2017   Abnormal MRI, knee 09/04/2017   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   Lymphedema 07/07/2016   Chronic hepatitis C without hepatic coma ([place]) 06/01/2016   Lumbar herniated disc 11/24/2015   Thrombocytopenia ([place]) 11/11/2015   Hepatitis C antibody test positive 07/13/2015   Chronic constipation 07/13/2015   Abnormal immunological findings in specimens from other organs, systems and tissues 07/13/2015   CN (constipation) 07/13/2015   Diabetes mellitus with coincident hypertension ([place]) 03/08/2015   COPD, mild ([place]) 03/08/2015   Dyslipidemia 03/08/2015   Paranoid schizophrenia ([place]) 03/08/2015   Acquired polyneuropathy 03/08/2015   Restless leg 03/08/2015   Tobacco abuse 03/08/2015   Callus of foot 03/08/2015   Claudication ([place]) 03/08/2015   Chronic obstructive pulmonary disease ([place]) 03/08/2015   Corn or callus 03/08/2015   Essential (primary) hypertension 03/08/2015   HLD (hyperlipidemia) 03/08/2015   Peripheral vascular disease ([place]) 03/08/2015   Polyneuropathy 03/08/2015   Current tobacco use 03/08/2015   Vitamin D deficiency 04/26/2009   Avitaminosis D 04/26/2009    Past Surgical History:  Procedure Laterality Date   BACK SURGERY     COLONOSCOPY WITH PROPOFOL N/A 09/07/2017   Procedure: COLONOSCOPY WITH PROPOFOL;  Surgeon: Virgel Manifold, MD;  Location: [facility] ENDOSCOPY;  Service: Endoscopy;  Laterality: N/A;   ESOPHAGOGASTRODUODENOSCOPY (EGD) WITH PROPOFOL N/A 12/07/2017   Procedure: ESOPHAGOGASTRODUODENOSCOPY (EGD) WITH PROPOFOL;  Surgeon: Bonna Gains,  Lennette Bihari, MD;  Location: [facility] ENDOSCOPY;  Service: Endoscopy;  Laterality: N/A;   FLEXIBLE SIGMOIDOSCOPY N/A 11/09/2017   Procedure: FLEXIBLE SIGMOIDOSCOPY;  Surgeon: Virgel Manifold, MD;  Location: [facility] ENDOSCOPY;  Service: Endoscopy;  Laterality: N/A;   HERNIA REPAIR     inguinal    INGUINAL HERNIA REPAIR Bilateral 06/21/2018   Procedure: LAPAROSCOPIC BILATERAL INGUINAL HERNIA REPAIR;  Surgeon: Vickie Epley, MD;  Location: [facility] ORS;  Service: General;  Laterality: Bilateral;   L3 TO L5 LAMINECTOMY FOR DECOMPRESSION  07/17/2016   [facility]   REPAIR OF CEREBROSPINAL FLUID LEAK N/A 09/08/2016   Procedure: Lumbar wound exploration, repair of pseudomenigocele;  Surgeon: Kevan Ny Ditty, MD;  Location: [facility];  Service: Neurosurgery;  Laterality: N/A;  Lumbar wound exploration, repair of pseudomenigocele   SINUSOTOMY      Family History  Problem Relation Age of Onset   Heart failure Mother    Hypertension Mother    Diverticulitis Mother    Colitis Mother    Diabetes Father    Hypertension Father    Heart disease Father    Diabetes Sister    Diverticulitis Brother     Social History   Tobacco Use   Smoking status: Every Day    Packs/day: 1.50    Years: 46.00    Pack years: 69.00    Types: Cigarettes   Smokeless tobacco: Never  Substance Use Topics   Alcohol use: No    Alcohol/week: 0.0 standard drinks     Current Outpatient Medications:    diltiazem (DILACOR XR) 240 MG 24 hr capsule, Take 1 capsule (240 mg total) by mouth daily., Disp: 30 capsule, Rfl: 0   Fluticasone-Umeclidin-Vilant (TRELEGY ELLIPTA) 100-62.5-25 MCG/INH AEPB, Take 1 puff by mouth every evening., Disp: 60 each, Rfl: 5   gabapentin (NEURONTIN) 300 MG capsule, Take 300 mg by mouth in the morning, at noon, and at bedtime., Disp: , Rfl:    insulin detemir (LEVEMIR) 100 UNIT/ML injection, Inject 0.06 mLs (6 Units total) into the skin at bedtime., Disp: 10 mL, Rfl: 0   pravastatin (PRAVACHOL) 20 MG tablet, TAKE 1 TABLET BY MOUTH ONCE DAILY WITH SUPPER, Disp: 90 tablet, Rfl: 0   rivaroxaban (XARELTO) 20 MG TABS tablet, Take 1 tablet (20 mg total) by mouth daily with supper., Disp: 30 tablet, Rfl: 0   tiZANidine (ZANAFLEX) 2 MG tablet, Take 2 mg by mouth every 12  (twelve) hours., Disp: , Rfl:    traMADol (ULTRAM) 50 MG tablet, Take by mouth every 12 (twelve) hours as needed., Disp: , Rfl:    Vitamin D, Ergocalciferol, (DRISDOL) 1.25 MG (50000 UNIT) CAPS capsule, Take 1 capsule (50,000 Units total) by mouth every 7 (seven) days., Disp: 12 capsule, Rfl: 1  Allergies  Allergen Reactions   Penicillins Hives and Swelling    As a child. Has patient had a PCN reaction causing immediate rash, facial/tongue/throat swelling, SOB or lightheadedness with hypotension: YES  Has patient had a PCN reaction causing severe rash involving mucus membranes or skin necrosis: UNKNOWN Has patient had a PCN reaction that required hospitalization: UNKNOWN Has patient had a PCN reaction occurring within the last 10 years: NO   Aspirin Nausea Only   Ibuprofen Nausea Only   Lyrica [Pregabalin] Nausea Only   Acetaminophen Nausea Only    I personally reviewed active problem list, medication list, allergies, family history, social history, health maintenance with the patient/caregiver today.   ROS  Constitutional: Negative for fever or weight  change.  Respiratory: Negative for cough and shortness of breath.   Cardiovascular: Negative for chest pain or palpitations.  Gastrointestinal: Negative for abdominal pain, no bowel changes.  Musculoskeletal: positive for gait problem ( legs get heavy and tired ) but no joint swelling.  Skin: Negative for rash but has easy bruising on arms  Neurological: Negative for dizziness or headache.  No other specific complaints in a complete review of systems (except as listed in HPI above).   Objective  Vitals:   04/28/21 0859  BP: 122/64  Pulse: 85  Resp: 16  Temp: 98.2 F (36.8 C)  SpO2: 99%  Weight: 163 lb (73.9 kg)  Height: '5\' 11"'$  (1.803 m)    Body mass index is 22.73 kg/m.  Physical Exam  Constitutional: Patient appears well-developed and well-nourished. No distress.  HEENT: head atraumatic, normocephalic, pupils equal  and reactive to light, neck supple Cardiovascular: Normal rate, regular rhythm and normal heart sounds.  No murmur heard. No BLE edema. Pulmonary/Chest: Effort normal and breath sounds normal. No respiratory distress. Abdominal: Soft.  There is no tenderness., rectal exam, normal anal tonus, no masses, stools present, yellow , negative hemoccult  Skin: senile purpura on both arms  Psychiatric: Patient has a normal mood and affect. behavior is normal. Judgment and thought content normal.   Recent Results (from the past 2160 hour(s))  POCT HgB A1C     Status: Abnormal   Collection Time: 03/16/21 11:14 AM  Result Value Ref Range   Hemoglobin A1C 7.3 (A) 4.0 - 5.6 %   HbA1c POC (<> result, manual entry)     HbA1c, POC (prediabetic range)     HbA1c, POC (controlled diabetic range)    Basic metabolic panel     Status: Abnormal   Collection Time: 04/19/21 10:04 PM  Result Value Ref Range   Sodium 135 135 - 145 mmol/L   Potassium 4.5 3.5 - 5.1 mmol/L   Chloride 93 (L) 98 - 111 mmol/L   CO2 22 22 - 32 mmol/L   Glucose, Bld 132 (H) 70 - 99 mg/dL    Comment: Glucose reference range applies only to samples taken after fasting for at least 8 hours.   BUN 26 (H) 8 - 23 mg/dL   Creatinine, Ser 1.34 (H) 0.61 - 1.24 mg/dL   Calcium 9.6 8.9 - 10.3 mg/dL   GFR, Estimated 58 (L) >60 mL/min    Comment: (NOTE) Calculated using the CKD-EPI Creatinine Equation (2021)    Anion gap 20 (H) 5 - 15    Comment: Performed at [facility], [address] [place], [place] [address]  CBC     Status: Abnormal   Collection Time: 04/19/21 10:04 PM  Result Value Ref Range   WBC 7.2 4.0 - 10.5 K/uL   RBC 6.20 (H) 4.22 - 5.81 MIL/uL   Hemoglobin 19.5 (H) 13.0 - 17.0 g/dL   HCT 55.2 (H) 39.0 - 52.0 %   MCV 89.0 80.0 - 100.0 fL   MCH 31.5 26.0 - 34.0 pg   MCHC 35.3 30.0 - 36.0 g/dL   RDW 12.6 11.5 - 15.5 %   Platelets 159 150 - 400 K/uL   nRBC 0.0 0.0 - 0.2 %    Comment: Performed at [facility], [address] [place], [place] [address]  Urinalysis, Complete w Microscopic     Status: Abnormal   Collection Time: 04/19/21 10:04 PM  Result Value Ref Range   Color, Urine YELLOW (A) YELLOW  APPearance HAZY (A) CLEAR   Specific Gravity, Urine 1.019 1.005 - 1.030   pH 6.0 5.0 - 8.0   Glucose, UA NEGATIVE NEGATIVE mg/dL   Hgb urine dipstick MODERATE (A) NEGATIVE   Bilirubin Urine NEGATIVE NEGATIVE   Ketones, ur 80 (A) NEGATIVE mg/dL   Protein, ur 30 (A) NEGATIVE mg/dL   Nitrite NEGATIVE NEGATIVE   Leukocytes,Ua NEGATIVE NEGATIVE   RBC / HPF 0-5 0 - 5 RBC/hpf   WBC, UA 0-5 0 - 5 WBC/hpf   Bacteria, UA NONE SEEN NONE SEEN   Squamous Epithelial / LPF 0-5 0 - 5   Mucus PRESENT    Hyaline Casts, UA PRESENT     Comment: Performed at [facility], [place], [place] [address]  Troponin I (High Sensitivity)     Status: None   Collection Time: 04/19/21 10:04 PM  Result Value Ref Range   Troponin I (High Sensitivity) 14 <18 ng/L    Comment: (NOTE) Elevated high sensitivity troponin I (hsTnI) values and significant  changes across serial measurements may suggest ACS but many other  chronic and acute conditions are known to elevate hsTnI results.  Refer to the "Links" section for chest pain algorithms and additional  guidance. Performed at [facility], [address] [place], [place] [address]   Magnesium     Status: None   Collection Time: 04/19/21 10:04 PM  Result Value Ref Range   Magnesium 2.0 1.7 - 2.4 mg/dL    Comment: Performed at [facility], [address] [place], [place] [address]  CK     Status: None   Collection Time: 04/19/21 10:04 PM  Result Value Ref Range   Total CK 175 49 - 397 U/L    Comment: Performed at [facility], [address] [place], [place] [address]  TSH     Status: None   Collection Time: 04/19/21 10:04 PM  Result Value Ref Range   TSH 1.706 0.350 - 4.500 uIU/mL    Comment:  Performed by a 3rd Generation assay with a functional sensitivity of <=0.01 uIU/mL. Performed at [facility], [address] [place], [place] [address]   Urine Drug Screen, Qualitative [facility] only)     Status: Abnormal   Collection Time: 04/19/21 10:04 PM  Result Value Ref Range   Tricyclic, Ur Screen NONE DETECTED NONE DETECTED   Amphetamines, Ur Screen NONE DETECTED NONE DETECTED   MDMA (Ecstasy)Ur Screen NONE DETECTED NONE DETECTED   Cocaine Metabolite,Ur Orr NONE DETECTED NONE DETECTED   Opiate, Ur Screen NONE DETECTED NONE DETECTED   Phencyclidine (PCP) Ur S NONE DETECTED NONE DETECTED   Cannabinoid 50 Ng, Ur [place] POSITIVE (A) NONE DETECTED   Barbiturates, Ur Screen NONE DETECTED NONE DETECTED   Benzodiazepine, Ur Scrn NONE DETECTED NONE DETECTED   Methadone Scn, Ur NONE DETECTED NONE DETECTED    Comment: (NOTE) Tricyclics + metabolites, urine    Cutoff 1000 ng/mL Amphetamines + metabolites, urine  Cutoff 1000 ng/mL MDMA (Ecstasy), urine              Cutoff 500 ng/mL Cocaine Metabolite, urine          Cutoff 300 ng/mL Opiate + metabolites, urine        Cutoff 300 ng/mL Phencyclidine (PCP), urine         Cutoff 25 ng/mL Cannabinoid, urine                 Cutoff 50 ng/mL Barbiturates +  metabolites, urine  Cutoff 200 ng/mL Benzodiazepine, urine              Cutoff 200 ng/mL Methadone, urine                   Cutoff 300 ng/mL  The urine drug screen provides only a preliminary, unconfirmed analytical test result and should not be used for non-medical purposes. Clinical consideration and professional judgment should be applied to any positive drug screen result due to possible interfering substances. A more specific alternate chemical method must be used in order to obtain a confirmed analytical result. Gas chromatography / mass spectrometry (GC/MS) is the preferred confirm atory method. Performed at [facility], [address] [place], [place] [address]    Resp Panel by RT-PCR (Flu A&B, Covid) Nasopharyngeal Swab     Status: Abnormal   Collection Time: 04/19/21 11:26 PM   Specimen: Nasopharyngeal Swab; Nasopharyngeal(NP) swabs in vial transport medium  Result Value Ref Range   SARS Coronavirus 2 by RT PCR POSITIVE (A) NEGATIVE    Comment: RESULT CALLED TO, READ BACK BY AND VERIFIED WITH: RAQUEL DAVID '@[id number]'$  ON 04/20/21 SK[facility] (NOTE) SARS-CoV-2 target nucleic acids are DETECTED.  The SARS-CoV-2 RNA is generally detectable in upper respiratory specimens during the acute phase of infection. Positive results are indicative of the presence of the identified virus, but do not rule out bacterial infection or co-infection with other pathogens not detected by the test. Clinical correlation with patient history and other diagnostic information is necessary to determine patient infection status. The expected result is Negative.  Fact Sheet for Patients: EntrepreneurPulse.com.au  Fact Sheet for Healthcare Providers: IncredibleEmployment.be  This test is not yet approved or cleared by the [place] FDA and  has been authorized for detection and/or diagnosis of SARS-CoV-2 by FDA under an Emergency Use Authorization (EUA).  This EUA will remain in effect (meaning this test can be  used) for the duration of  the COVID-19 declaration under Section 564(b)(1) of the Act, 21 U.S.C. section 360bbb-3(b)(1), unless the authorization is terminated or revoked sooner.     Influenza A by PCR NEGATIVE NEGATIVE   Influenza B by PCR NEGATIVE NEGATIVE    Comment: (NOTE) The Xpert Xpress SARS-CoV-2/FLU/RSV plus assay is intended as an aid in the diagnosis of influenza from Nasopharyngeal swab specimens and should not be used as a sole basis for treatment. Nasal washings and aspirates are unacceptable for Xpert Xpress SARS-CoV-2/FLU/RSV testing.  Fact Sheet for Patients: EntrepreneurPulse.com.au  Fact  Sheet for Healthcare Providers: IncredibleEmployment.be  This test is not yet approved or cleared by the [place] FDA and has been authorized for detection and/or diagnosis of SARS-CoV-2 by FDA under an Emergency Use Authorization (EUA). This EUA will remain in effect (meaning this test can be used) for the duration of the COVID-19 declaration under Section 564(b)(1) of the Act, 21 U.S.C. section 360bbb-3(b)(1), unless the authorization is terminated or revoked.  Performed at [facility], [place], [place] [address]   Troponin I (High Sensitivity)     Status: None   Collection Time: 04/20/21  1:31 AM  Result Value Ref Range   Troponin I (High Sensitivity) 11 <18 ng/L    Comment: (NOTE) Elevated high sensitivity troponin I (hsTnI) values and significant  changes across serial measurements may suggest ACS but many other  chronic and acute conditions are known to elevate hsTnI results.  Refer to the "Links" section for chest pain algorithms and additional  guidance. Performed at [facility], [address] [place], [place] [address]   CBG monitoring, ED     Status: Abnormal   Collection Time: 04/20/21  8:08 AM  Result Value Ref Range   Glucose-Capillary 164 (H) 70 - 99 mg/dL    Comment: Glucose reference range applies only to samples taken after fasting for at least 8 hours.  Protime-INR     Status: None   Collection Time: 04/20/21  9:20 AM  Result Value Ref Range   Prothrombin Time 13.4 11.4 - 15.2 seconds   INR 1.0 0.8 - 1.2    Comment: (NOTE) INR goal varies based on device and disease states. Performed at [facility] [address] [place], [place] [address]   APTT     Status: None   Collection Time: 04/20/21  9:20 AM  Result Value Ref Range   aPTT 28 24 - 36 seconds    Comment: Performed at [facility], [address] [place], [place] [address]  ECHOCARDIOGRAM COMPLETE     Status:  None   Collection Time: 04/20/21 10:59 AM  Result Value Ref Range   Weight 2,640 oz   Height 71 in   BP 133/73 mmHg   S' Lateral 2.80 cm  CBG monitoring, ED     Status: Abnormal   Collection Time: 04/20/21 11:56 AM  Result Value Ref Range   Glucose-Capillary 116 (H) 70 - 99 mg/dL    Comment: Glucose reference range applies only to samples taken after fasting for at least 8 hours.  HIV Antibody (routine testing w rflx)     Status: None   Collection Time: 04/20/21  4:02 PM  Result Value Ref Range   HIV Screen 4th Generation wRfx Non Reactive Non Reactive    Comment: Performed at [facility], [place] [address], [place], [place] [address]  Heparin level (unfractionated)     Status: Abnormal   Collection Time: 04/20/21  4:02 PM  Result Value Ref Range   Heparin Unfractionated 0.93 (H) 0.30 - 0.70 IU/mL    Comment: (NOTE) The clinical reportable range upper limit is being lowered to >1.10 to align with the FDA approved guidance for the current laboratory assay.  If heparin results are below expected values, and patient dosage has  been confirmed, suggest follow up testing of antithrombin III levels. Performed at [facility], [address] [place],  [address]   Glucose, capillary     Status: Abnormal   Collection Time: 04/20/21  4:06 PM  Result Value Ref Range   Glucose-Capillary 137 (H) 70 - 99 mg/dL    Comment: Glucose reference range applies only to samples taken after fasting for at least 8 hours.  Glucose, capillary     Status: Abnormal   Collection Time: 04/20/21  7:46 PM  Result Value Ref Range   Glucose-Capillary 185 (H) 70 - 99 mg/dL    Comment: Glucose reference range applies only to samples taken after fasting for at least 8 hours.  Heparin level (unfractionated)     Status: None   Collection Time: 04/21/21 12:35 AM  Result Value Ref Range   Heparin Unfractionated 0.33 0.30 - 0.70 IU/mL    Comment: (NOTE) The clinical reportable range upper  limit is being lowered to >1.10 to align with the FDA approved guidance for the current laboratory assay.  If heparin results are below expected values, and patient dosage has  been confirmed, suggest follow up testing of antithrombin III  levels. Performed at [facility], [address] [place], [place] [address]   CBC     Status: Abnormal   Collection Time: 04/21/21 12:35 AM  Result Value Ref Range   WBC 5.2 4.0 - 10.5 K/uL   RBC 5.01 4.22 - 5.81 MIL/uL   Hemoglobin 16.3 13.0 - 17.0 g/dL   HCT 44.5 39.0 - 52.0 %   MCV 88.8 80.0 - 100.0 fL   MCH 32.5 26.0 - 34.0 pg   MCHC 36.6 (H) 30.0 - 36.0 g/dL   RDW 12.3 11.5 - 15.5 %   Platelets 137 (L) 150 - 400 K/uL   nRBC 0.0 0.0 - 0.2 %    Comment: Performed at [facility], [address] [place], [place] [id number]  Basic metabolic panel     Status: Abnormal   Collection Time: 04/21/21 12:35 AM  Result Value Ref Range   Sodium 134 (L) 135 - 145 mmol/L   Potassium 3.8 3.5 - 5.1 mmol/L   Chloride 101 98 - 111 mmol/L   CO2 22 22 - 32 mmol/L   Glucose, Bld 142 (H) 70 - 99 mg/dL    Comment: Glucose reference range applies only to samples taken after fasting for at least 8 hours.   BUN 16 8 - 23 mg/dL   Creatinine, Ser 0.70 0.61 - 1.24 mg/dL   Calcium 8.7 (L) 8.9 - 10.3 mg/dL   GFR, Estimated >60 >60 mL/min    Comment: (NOTE) Calculated using the CKD-EPI Creatinine Equation (2021)    Anion gap 11 5 - 15    Comment: Performed at [facility], [place], [place] [address]  Heparin level (unfractionated)     Status: None   Collection Time: 04/21/21  6:57 AM  Result Value Ref Range   Heparin Unfractionated 0.30 0.30 - 0.70 IU/mL    Comment: (NOTE) The clinical reportable range upper limit is being lowered to >1.10 to align with the FDA approved guidance for the current laboratory assay.  If heparin results are below expected values, and patient dosage has  been confirmed, suggest follow  up testing of antithrombin III levels. Performed at [facility], [address] [place], [place] [address]   Glucose, capillary     Status: Abnormal   Collection Time: 04/21/21  8:29 AM  Result Value Ref Range   Glucose-Capillary 114 (H) 70 - 99 mg/dL    Comment: Glucose reference range applies only to samples taken after fasting for at least 8 hours.   Comment 1 Notify RN   Glucose, capillary     Status: Abnormal   Collection Time: 04/21/21 12:38 PM  Result Value Ref Range   Glucose-Capillary 170 (H) 70 - 99 mg/dL    Comment: Glucose reference range applies only to samples taken after fasting for at least 8 hours.  Glucose, capillary     Status: Abnormal   Collection Time: 04/21/21  6:17 PM  Result Value Ref Range   Glucose-Capillary 133 (H) 70 - 99 mg/dL    Comment: Glucose reference range applies only to samples taken after fasting for at least 8 hours.  Glucose, capillary     Status: Abnormal   Collection Time: 04/21/21  8:13 PM  Result Value Ref Range   Glucose-Capillary 189 (H) 70 - 99 mg/dL    Comment: Glucose reference range applies only to samples taken after fasting for at least 8 hours.  CBC     Status: Abnormal   Collection  Time: 04/22/21  4:56 AM  Result Value Ref Range   WBC 5.3 4.0 - 10.5 K/uL   RBC 4.97 4.22 - 5.81 MIL/uL   Hemoglobin 15.9 13.0 - 17.0 g/dL   HCT 44.3 39.0 - 52.0 %   MCV 89.1 80.0 - 100.0 fL   MCH 32.0 26.0 - 34.0 pg   MCHC 35.9 30.0 - 36.0 g/dL   RDW 12.3 11.5 - 15.5 %   Platelets 132 (L) 150 - 400 K/uL   nRBC 0.0 0.0 - 0.2 %    Comment: Performed at [facility], [address] [place], [place] [address]  Glucose, capillary     Status: Abnormal   Collection Time: 04/22/21  9:05 AM  Result Value Ref Range   Glucose-Capillary 143 (H) 70 - 99 mg/dL    Comment: Glucose reference range applies only to samples taken after fasting for at least 8 hours.     PHQ2/9: Depression screen [facility] 2/9 04/28/2021 03/16/2021 12/02/2020  09/15/2020 03/29/2020  Decreased Interest 0 0 0 0 0  Down, Depressed, Hopeless 0 0 0 0 0  PHQ - 2 Score 0 0 0 0 0  Altered sleeping - - - - 0  Tired, decreased energy - - - - 0  Change in appetite - - - - 0  Feeling bad or failure about yourself  - - - - 0  Trouble concentrating - - - - 0  Moving slowly or fidgety/restless - - - - 0  Suicidal thoughts - - - - 0  PHQ-9 Score - - - - 0  Difficult doing work/chores - - - - -  Some recent data might be hidden    phq 9 is negative   Fall Risk: Fall Risk  04/28/2021 03/16/2021 12/02/2020 09/15/2020 03/29/2020  Falls in the past year? 0 0 0 0 0  Number falls in past yr: 0 0 0 0 0  Injury with Fall? 0 0 0 0 0  Risk for fall due to : No Fall Risks - No Fall Risks - -  Follow up Falls prevention discussed - Falls prevention discussed - -     Functional Status Survey: Is the patient deaf or have difficulty hearing?: No Does the patient have difficulty seeing, even when wearing glasses/contacts?: No Does the patient have difficulty concentrating, remembering, or making decisions?: No Does the patient have difficulty walking or climbing stairs?: No Does the patient have difficulty dressing or bathing?: No Does the patient have difficulty doing errands alone such as visiting a doctor's office or shopping?: No    Assessment & Plan  1. Hospital discharge follow-up   2. Need for immunization against influenza  - Flu Vaccine QUAD High Dose(Fluad)  3. Rapid atrial fibrillation (HCC)  Heart rate controlled with cardizem, keep follow up with cardiologist  4. History of COVID-19   5. Thrombocytopenia ([place])  Recheck next visit   6. Black stools  - POCT Occult Blood Stool

## 2021-04-28 ENCOUNTER — Encounter: Payer: Self-pay | Admitting: Family Medicine

## 2021-04-28 ENCOUNTER — Other Ambulatory Visit: Payer: Self-pay

## 2021-04-28 ENCOUNTER — Ambulatory Visit (INDEPENDENT_AMBULATORY_CARE_PROVIDER_SITE_OTHER): Payer: PPO | Admitting: Family Medicine

## 2021-04-28 VITALS — BP 122/64 | HR 85 | Temp 98.2°F | Resp 16 | Ht 71.0 in | Wt 163.0 lb

## 2021-04-28 DIAGNOSIS — Z8616 Personal history of COVID-19: Secondary | ICD-10-CM | POA: Diagnosis not present

## 2021-04-28 DIAGNOSIS — K921 Melena: Secondary | ICD-10-CM | POA: Diagnosis not present

## 2021-04-28 DIAGNOSIS — Z23 Encounter for immunization: Secondary | ICD-10-CM

## 2021-04-28 DIAGNOSIS — Z09 Encounter for follow-up examination after completed treatment for conditions other than malignant neoplasm: Secondary | ICD-10-CM | POA: Diagnosis not present

## 2021-04-28 DIAGNOSIS — I4891 Unspecified atrial fibrillation: Secondary | ICD-10-CM

## 2021-04-28 DIAGNOSIS — D696 Thrombocytopenia, unspecified: Secondary | ICD-10-CM

## 2021-04-28 LAB — HEMOCCULT GUIAC POC 1CARD (OFFICE): Fecal Occult Blood, POC: NEGATIVE

## 2021-04-28 NOTE — Patient Instructions (Signed)
Dr. Bonna Gains

## 2021-05-06 ENCOUNTER — Encounter: Payer: Self-pay | Admitting: Internal Medicine

## 2021-05-06 ENCOUNTER — Ambulatory Visit: Payer: PPO | Admitting: Internal Medicine

## 2021-05-06 ENCOUNTER — Other Ambulatory Visit: Payer: Self-pay

## 2021-05-06 ENCOUNTER — Telehealth: Payer: Self-pay | Admitting: *Deleted

## 2021-05-06 VITALS — BP 130/74 | HR 80 | Ht 71.0 in | Wt 162.0 lb

## 2021-05-06 DIAGNOSIS — I4819 Other persistent atrial fibrillation: Secondary | ICD-10-CM | POA: Diagnosis not present

## 2021-05-06 DIAGNOSIS — I4891 Unspecified atrial fibrillation: Secondary | ICD-10-CM

## 2021-05-06 DIAGNOSIS — R0602 Shortness of breath: Secondary | ICD-10-CM | POA: Diagnosis not present

## 2021-05-06 DIAGNOSIS — R079 Chest pain, unspecified: Secondary | ICD-10-CM | POA: Diagnosis not present

## 2021-05-06 MED ORDER — RIVAROXABAN 20 MG PO TABS: 20.0000 mg | ORAL_TABLET | Freq: Every day | ORAL | 5 refills | Status: DC

## 2021-05-06 MED ORDER — DILTIAZEM HCL ER 240 MG PO CP24: 240.0000 mg | ORAL_CAPSULE | Freq: Every day | ORAL | 3 refills | Status: DC

## 2021-05-06 NOTE — Progress Notes (Signed)
Follow-up Outpatient Visit Date: 05/06/2021  Primary Care Provider: [name], [address] [place] [address]  Chief Complaint: Follow-up atrial fibrillation  HPI:  Mr. Spiker is a 65 y.o. male with history of recently diagnosed atrial fibrillation, hypertension, type 2 diabetes mellitus, PAD, COPD, schizophrenia/schizoaffective disorder, and tobacco use, who presents for follow-up of atrial fibrillation.  He was initially diagnosed with atrial fibrillation in July by his PCP and referred to our office.  However, before he could be seen, he was hospitalized with COVID-19 and in late August complicated by atrial fibrillation with rapid ventricular response.  He had been prescribed rivaroxaban by his PCP in July, though Mr. Barbush had not started this medication due to interaction with meloxicam.  During his hospitalization, he was started on rivaroxaban and diltiazem with improved heart rate control at the direction of Dr. Rockey Situ.  Today, Mr. Parekh reports that he has been feeling fairly well, though he notes intermittent dyspnea on exertion over the last week.  He also reports occasional "cramp-like" pain in his chest that happens randomly and typically resolves in <5 minutes.  It is not exertional.  He tries to walk 15-20 minutes at least once a week.  He denies edema, orthopnea, palpitations, and lightheadedness.  He has been compliant with his medications and has not missed any doses of rivaroxaban since it was started in the hospital last month.  He is no longer using meloxicam.  --------------------------------------------------------------------------------------------------  Past Medical History:  Diagnosis Date   Arthritis    Asthma    Benign neoplasm of ascending colon    polyps   Constipation    COPD (chronic obstructive pulmonary disease) ([place])    NO INHALER   Diabetes mellitus without complication (HCC)    Type 2   Diverticulosis    Dyspnea     RARE-WITH EXERTION DUE TO COPD   GERD (gastroesophageal reflux disease)    Hepatitis C    History of kidney stones    H/O   Hyperlipidemia    Hypertension 06/10/2018   currently not under control   Left inguinal hernia    Peripheral vascular disease (HCC)    Restless legs    Schizoaffective disorder (HCC)    Pt denies   Thrombocytopenia ([place])    Vitamin D deficiency    Past Surgical History:  Procedure Laterality Date   BACK SURGERY     COLONOSCOPY WITH PROPOFOL N/A 09/07/2017   Procedure: COLONOSCOPY WITH PROPOFOL;  Surgeon: Virgel Manifold, MD;  Location: [facility] ENDOSCOPY;  Service: Endoscopy;  Laterality: N/A;   ESOPHAGOGASTRODUODENOSCOPY (EGD) WITH PROPOFOL N/A 12/07/2017   Procedure: ESOPHAGOGASTRODUODENOSCOPY (EGD) WITH PROPOFOL;  Surgeon: Virgel Manifold, MD;  Location: [facility] END[facility];  Service: Endoscopy;  Laterality: N/A;   FLEXIBLE SIGMOIDOSCOPY N/A 11/09/2017   Procedure: FLEXIBLE SIGMOIDOSCOPY;  Surgeon: Virgel Manifold, MD;  Location: [facility];  Service: Endoscopy;  Laterality: N/A;   HERNIA REPAIR     inguinal   INGUINAL HERNIA REPAIR Bilateral 06/21/2018   Procedure: LAPAROSCOPIC BILATERAL INGUINAL HERNIA REPAIR;  Surgeon: Vickie Epley, MD;  Location: [facility] ORS;  Service: General;  Laterality: Bilateral;   L3 TO L5 LAMINECTOMY FOR DECOMPRESSION  07/17/2016   [facility]   REPAIR OF CEREBROSPINAL FLUID LEAK N/A 09/08/2016   Procedure: Lumbar wound exploration, repair of pseudomenigocele;  Surgeon: Kevan Ny Ditty, MD;  Location: [facility];  Service: Neurosurgery;  Laterality: N/A;  Lumbar wound exploration, repair of pseudomenigocele   SINUSOTOMY  Current Meds  Medication Sig   diltiazem (DILACOR XR) 240 MG 24 hr capsule Take 1 capsule (240 mg total) by mouth daily.   ergocalciferol (VITAMIN D2) 1.25 MG (50000 UT) capsule Take 50,000 Units by mouth. Every other week   Fluticasone-Umeclidin-Vilant (TRELEGY ELLIPTA)  100-62.5-25 MCG/INH AEPB Take 1 puff by mouth every evening.   gabapentin (NEURONTIN) 300 MG capsule Take 300 mg by mouth. Take one capsule by mouth in the AM, one capsule at noon, and 2 capsules in the evening   insulin detemir (LEVEMIR) 100 UNIT/ML injection Inject 0.06 mLs (6 Units total) into the skin at bedtime.   pravastatin (PRAVACHOL) 20 MG tablet TAKE 1 TABLET BY MOUTH ONCE DAILY WITH SUPPER   rivaroxaban (XARELTO) 20 MG TABS tablet Take 1 tablet (20 mg total) by mouth daily with supper.   tiZANidine (ZANAFLEX) 2 MG tablet Take 2 mg by mouth every 12 (twelve) hours.   traMADol (ULTRAM) 50 MG tablet Take by mouth every 12 (twelve) hours as needed.    Allergies: Penicillins, Aspirin, Ibuprofen, Lyrica [pregabalin], and Acetaminophen  Social History   Tobacco Use   Smoking status: Every Day    Packs/day: 1.50    Years: 46.00    Pack years: 69.00    Types: Cigarettes   Smokeless tobacco: Never  Vaping Use   Vaping Use: Never used  Substance Use Topics   Alcohol use: No    Alcohol/week: 0.0 standard drinks   Drug use: No    Family History  Problem Relation Age of Onset   Heart failure Mother    Hypertension Mother    Diverticulitis Mother    Colitis Mother    Diabetes Father    Hypertension Father    Heart disease Father    Diabetes Sister    Diverticulitis Brother     Review of Systems: A 12-system review of systems was performed and was negative except as noted in the HPI.  --------------------------------------------------------------------------------------------------  Physical Exam: BP 130/74 (BP Location: Right Arm, Patient Position: Sitting, Cuff Size: Normal)   Pulse 80   Ht '5\' 11"'$  (1.803 m)   Wt 162 lb (73.5 kg)   SpO2 97%   BMI 22.59 kg/m   General:  NAD. Neck: No JVD or HJR. Lungs: Clear to auscultation bilaterally without wheezes or crackles. Heart: Irregularly irregular rhythm without murmurs, rubs, or gallops. Abdomen: Soft, nontender,  nondistended. Extremities: No lower extremity edema.  EKG:  Atrial fibrillation with PVC versus aberrancy.  Lab Results  Component Value Date   WBC 5.3 04/22/2021   HGB 15.9 04/22/2021   HCT 44.3 04/22/2021   MCV 89.1 04/22/2021   PLT 132 (L) 04/22/2021    Lab Results  Component Value Date   NA 134 (L) 04/21/2021   K 3.8 04/21/2021   CL 101 04/21/2021   CO2 22 04/21/2021   BUN 16 04/21/2021   CREATININE 0.70 04/21/2021   GLUCOSE 142 (H) 04/21/2021   ALT 14 09/15/2020    Lab Results  Component Value Date   CHOL 121 09/15/2020   HDL 51 09/15/2020   LDLCALC 54 09/15/2020   TRIG 76 09/15/2020   CHOLHDL 2.4 09/15/2020    --------------------------------------------------------------------------------------------------  ASSESSMENT AND PLAN: Persistent atrial fibrillation: Mr. Harts remains in atrial fibrillation with reasonable ventricular rate control.  We will continue his current regimen of diltiazem and rivaroxaban.  I wonder if his exertional dyspnea and atypical chest pain could be due to atrial fibrillation.  I think he would benefit from  restoration of sinus rhythm.  We have discussed the risks and benefits of cardioversion and have agreed to proceed later this month when he has completed 4 weeks of uninterrupted anticoagulation.  Atypical chest pain and shortness of breath: Pain is non-specific and notably not exertional.  Recent echo showed normal LVEF.  Given new a-fib and several cardiac risk factors (DM, HLD, PAD, tobacco use, male gender, and age > 48), we have agreed to obtain a pharmacologic myocardial perfusion stress test.  We will defer adding aspirin given ongoing anticoagulation with rivaroxiban.  Tobacco abuse: Smoking cessation strongly encouraged.  Shared Decision Making/Informed Consent The risks [chest pain, shortness of breath, cardiac arrhythmias, dizziness, blood pressure fluctuations, myocardial infarction, stroke/transient ischemic attack,  nausea, vomiting, allergic reaction, radiation exposure, metallic taste sensation and life-threatening complications (estimated to be 1 in 10,000)], benefits (risk stratification, diagnosing coronary artery disease, treatment guidance) and alternatives of a nuclear stress test were discussed in detail with Mr. Steurer and he agrees to proceed.  The risks (stroke, cardiac arrhythmias rarely resulting in the need for a temporary or permanent pacemaker, skin irritation or burns and complications associated with conscious sedation including aspiration, arrhythmia, respiratory failure and death), benefits (restoration of normal sinus rhythm) and alternatives of a direct current cardioversion were explained in detail to Mr. Antoniewicz and he agrees to proceed.   Follow-up: Return to clinic ~2 weeks after cardioversion.  [name], MD 05/06/2021 2:27 PM

## 2021-05-06 NOTE — Telephone Encounter (Signed)
Prescription refill request for Xarelto received.  Indication: Atrial fib Last office visit: 05/06/21  C End MD Weight: 73.5kg Age: 65 Scr: 0.70 on 04/21/21 CrCl: 107.92  Based on above findings Xarelto '20mg'$  daily is the appropriate dose.  Refill approved.

## 2021-05-06 NOTE — Patient Instructions (Signed)
Medication Instructions:   Your physician recommends that you continue on your current medications as directed. Please refer to the Current Medication list given to you today.  *If you need a refill on your cardiac medications before your next appointment, please call your pharmacy*   Lab Work:  None ordered  Testing/Procedures:  1) [place]  Your caregiver has ordered a Clinical cytogeneticist) Stress Test with nuclear imaging. The purpose of this test is to evaluate the blood supply to your heart muscle. This procedure is referred to as a "Non-Invasive Stress Test." This is because other than having an IV started in your vein, nothing is inserted or "invades" your body. Cardiac stress tests are done to find areas of poor blood flow to the heart by determining the extent of coronary artery disease (CAD). Some patients exercise on a treadmill, which naturally increases the blood flow to your heart, while others who are  unable to walk on a treadmill due to physical limitations have a pharmacologic/chemical stress agent called Lexiscan . This medicine will mimic walking on a treadmill by temporarily increasing your coronary blood flow.   Please note: these test may take anywhere between 2-4 hours to complete  PLEASE REPORT TO [facility] AT THE FIRST DESK WILL DIRECT YOU WHERE TO GO  Date of Procedure:_____________________________________  Arrival Time for Procedure:______________________________  Instructions regarding medication:   __X__ : Hold diabetes medication morning of procedure - Levimir (Take HALF dose evening prior to procedure)  ____:  Hold betablocker(s) night before procedure and morning of procedure  ____:  Hold other medications as follows:   PLEASE NOTIFY THE OFFICE AT LEAST 24 HOURS IN ADVANCE IF YOU ARE UNABLE TO KEEP YOUR APPOINTMENT.  [phone] AND  PLEASE NOTIFY NUCLEAR MEDICINE AT [facility] AT LEAST 24 HOURS IN ADVANCE IF YOU ARE  UNABLE TO KEEP YOUR APPOINTMENT. [phone]  How to prepare for your Myoview test:  Do not eat or drink after midnight No caffeine for 24 hours prior to test No smoking 24 hours prior to test. Your medication may be taken with water.  If your doctor stopped a medication because of this test, do not take that medication. Please wear a short sleeve shirt. No perfume, cologne or lotion.   2) Your physician has recommended that you have a Cardioversion (DCCV). Electrical Cardioversion uses a jolt of electricity to your heart either through paddles or wired patches attached to your chest. This is a controlled, usually prescheduled, procedure. Defibrillation is done under light anesthesia in the hospital, and you usually go home the day of the procedure. This is done to get your heart back into a normal rhythm. You are not awake for the procedure. Please see the instruction sheet given to you today.  You are scheduled for a Cardioversion on Friday May 20, 2021 with Dr. Fletcher Anon.  Please arrive at the [facility] at 6:30 a.m. on the day of your procedure.  DIET INSTRUCTIONS:  Nothing to eat or drink after midnight except your medications with a sip of water.         Labs: No further labs needed at this time.   Medications:          -  Take HALF dose of insulin the EVENING prior to your procedure.         -  HOLD Insulin the MORNING of your procedure.               -  YOU MAY TAKE ALL of your remaining medications with a small amount of water.  Must have a responsible person to drive you home.  Bring a current list of your medications and current insurance cards.    If you have any questions after you get home, please call the office at [phone]    Follow-Up:  At [facility], you and your health needs are our priority.  As part of our continuing mission to provide you with exceptional heart care, we have created designated Provider Care Teams.  These Care Teams  include your primary Cardiologist (physician) and Advanced Practice Providers (APPs -  Physician Assistants and Nurse Practitioners) who all work together to provide you with the care you need, when you need it.  We recommend signing up for the patient portal called "MyChart".  Sign up information is provided on this After Visit Summary.  MyChart is used to connect with patients for Virtual Visits (Telemedicine).  Patients are able to view lab/test results, encounter notes, upcoming appointments, etc.  Non-urgent messages can be sent to your provider as well.   To learn more about what you can do with MyChart, go to [email or web address].    Your next appointment:    Follow up 2 weeks after cardioversion  The format for your next appointment:   In Person  Provider:   You may see Dr. Harrell Gave End or one of the following Advanced Practice Providers on your designated Care Team:   Murray Hodgkins, NP Christell Faith, PA-C Marrianne Mood, PA-C Cadence Turpin, [place]

## 2021-05-06 NOTE — Telephone Encounter (Signed)
Pt seen in office today with Dr. Saunders Revel.  Please refill pt's Xarelto.

## 2021-05-06 NOTE — H&P (View-Only) (Signed)
Follow-up Outpatient Visit Date: 05/06/2021  Primary Care Provider: [name], [address] [place] [address]  Chief Complaint: Follow-up atrial fibrillation  HPI:  Mr. Nessmith is a 65 y.o. male with history of recently diagnosed atrial fibrillation, hypertension, type 2 diabetes mellitus, PAD, COPD, schizophrenia/schizoaffective disorder, and tobacco use, who presents for follow-up of atrial fibrillation.  He was initially diagnosed with atrial fibrillation in July by his PCP and referred to our office.  However, before he could be seen, he was hospitalized with COVID-19 and in late August complicated by atrial fibrillation with rapid ventricular response.  He had been prescribed rivaroxaban by his PCP in July, though Mr. Vonbargen had not started this medication due to interaction with meloxicam.  During his hospitalization, he was started on rivaroxaban and diltiazem with improved heart rate control at the direction of Dr. Rockey Situ.  Today, Mr. Steward reports that he has been feeling fairly well, though he notes intermittent dyspnea on exertion over the last week.  He also reports occasional "cramp-like" pain in his chest that happens randomly and typically resolves in <5 minutes.  It is not exertional.  He tries to walk 15-20 minutes at least once a week.  He denies edema, orthopnea, palpitations, and lightheadedness.  He has been compliant with his medications and has not missed any doses of rivaroxaban since it was started in the hospital last month.  He is no longer using meloxicam.  --------------------------------------------------------------------------------------------------  Past Medical History:  Diagnosis Date   Arthritis    Asthma    Benign neoplasm of ascending colon    polyps   Constipation    COPD (chronic obstructive pulmonary disease) ([place])    NO INHALER   Diabetes mellitus without complication (HCC)    Type 2   Diverticulosis    Dyspnea     RARE-WITH EXERTION DUE TO COPD   GERD (gastroesophageal reflux disease)    Hepatitis C    History of kidney stones    H/O   Hyperlipidemia    Hypertension 06/10/2018   currently not under control   Left inguinal hernia    Peripheral vascular disease (HCC)    Restless legs    Schizoaffective disorder (HCC)    Pt denies   Thrombocytopenia ([place])    Vitamin D deficiency    Past Surgical History:  Procedure Laterality Date   BACK SURGERY     COLONOSCOPY WITH PROPOFOL N/A 09/07/2017   Procedure: COLONOSCOPY WITH PROPOFOL;  Surgeon: Virgel Manifold, MD;  Location: [facility] END[facility];  Service: Endoscopy;  Laterality: N/A;   ESOPHAGOGASTRODUODENOSCOPY (EGD) WITH PROPOFOL N/A 12/07/2017   Procedure: ESOPHAGOGASTRODUODENOSCOPY (EGD) WITH PROPOFOL;  Surgeon: Virgel Manifold, MD;  Location: [facility];  Service: Endoscopy;  Laterality: N/A;   FLEXIBLE SIGMOIDOSCOPY N/A 11/09/2017   Procedure: FLEXIBLE SIGMOIDOSCOPY;  Surgeon: Virgel Manifold, MD;  Location: [facility];  Service: Endoscopy;  Laterality: N/A;   HERNIA REPAIR     inguinal   INGUINAL HERNIA REPAIR Bilateral 06/21/2018   Procedure: LAPAROSCOPIC BILATERAL INGUINAL HERNIA REPAIR;  Surgeon: Vickie Epley, MD;  Location: [facility];  Service: General;  Laterality: Bilateral;   L3 TO L5 LAMINECTOMY FOR DECOMPRESSION  07/17/2016   [facility]   REPAIR OF CEREBROSPINAL FLUID LEAK N/A 09/08/2016   Procedure: Lumbar wound exploration, repair of pseudomenigocele;  Surgeon: Kevan Ny Ditty, MD;  Location: [facility];  Service: Neurosurgery;  Laterality: N/A;  Lumbar wound exploration, repair of pseudomenigocele   SINUSOTOMY  Current Meds  Medication Sig   diltiazem (DILACOR XR) 240 MG 24 hr capsule Take 1 capsule (240 mg total) by mouth daily.   ergocalciferol (VITAMIN D2) 1.25 MG (50000 UT) capsule Take 50,000 Units by mouth. Every other week   Fluticasone-Umeclidin-Vilant (TRELEGY ELLIPTA)  100-62.5-25 MCG/INH AEPB Take 1 puff by mouth every evening.   gabapentin (NEURONTIN) 300 MG capsule Take 300 mg by mouth. Take one capsule by mouth in the AM, one capsule at noon, and 2 capsules in the evening   insulin detemir (LEVEMIR) 100 UNIT/ML injection Inject 0.06 mLs (6 Units total) into the skin at bedtime.   pravastatin (PRAVACHOL) 20 MG tablet TAKE 1 TABLET BY MOUTH ONCE DAILY WITH SUPPER   rivaroxaban (XARELTO) 20 MG TABS tablet Take 1 tablet (20 mg total) by mouth daily with supper.   tiZANidine (ZANAFLEX) 2 MG tablet Take 2 mg by mouth every 12 (twelve) hours.   traMADol (ULTRAM) 50 MG tablet Take by mouth every 12 (twelve) hours as needed.    Allergies: Penicillins, Aspirin, Ibuprofen, Lyrica [pregabalin], and Acetaminophen  Social History   Tobacco Use   Smoking status: Every Day    Packs/day: 1.50    Years: 46.00    Pack years: 69.00    Types: Cigarettes   Smokeless tobacco: Never  Vaping Use   Vaping Use: Never used  Substance Use Topics   Alcohol use: No    Alcohol/week: 0.0 standard drinks   Drug use: No    Family History  Problem Relation Age of Onset   Heart failure Mother    Hypertension Mother    Diverticulitis Mother    Colitis Mother    Diabetes Father    Hypertension Father    Heart disease Father    Diabetes Sister    Diverticulitis Brother     Review of Systems: A 12-system review of systems was performed and was negative except as noted in the HPI.  --------------------------------------------------------------------------------------------------  Physical Exam: BP 130/74 (BP Location: Right Arm, Patient Position: Sitting, Cuff Size: Normal)   Pulse 80   Ht '5\' 11"'$  (1.803 m)   Wt 162 lb (73.5 kg)   SpO2 97%   BMI 22.59 kg/m   General:  NAD. Neck: No JVD or HJR. Lungs: Clear to auscultation bilaterally without wheezes or crackles. Heart: Irregularly irregular rhythm without murmurs, rubs, or gallops. Abdomen: Soft, nontender,  nondistended. Extremities: No lower extremity edema.  EKG:  Atrial fibrillation with PVC versus aberrancy.  Lab Results  Component Value Date   WBC 5.3 04/22/2021   HGB 15.9 04/22/2021   HCT 44.3 04/22/2021   MCV 89.1 04/22/2021   PLT 132 (L) 04/22/2021    Lab Results  Component Value Date   NA 134 (L) 04/21/2021   K 3.8 04/21/2021   CL 101 04/21/2021   CO2 22 04/21/2021   BUN 16 04/21/2021   CREATININE 0.70 04/21/2021   GLUCOSE 142 (H) 04/21/2021   ALT 14 09/15/2020    Lab Results  Component Value Date   CHOL 121 09/15/2020   HDL 51 09/15/2020   LDLCALC 54 09/15/2020   TRIG 76 09/15/2020   CHOLHDL 2.4 09/15/2020    --------------------------------------------------------------------------------------------------  ASSESSMENT AND PLAN: Persistent atrial fibrillation: Mr. Janke remains in atrial fibrillation with reasonable ventricular rate control.  We will continue his current regimen of diltiazem and rivaroxaban.  I wonder if his exertional dyspnea and atypical chest pain could be due to atrial fibrillation.  I think he would benefit from  restoration of sinus rhythm.  We have discussed the risks and benefits of cardioversion and have agreed to proceed later this month when he has completed 4 weeks of uninterrupted anticoagulation.  Atypical chest pain and shortness of breath: Pain is non-specific and notably not exertional.  Recent echo showed normal LVEF.  Given new a-fib and several cardiac risk factors (DM, HLD, PAD, tobacco use, male gender, and age > 76), we have agreed to obtain a pharmacologic myocardial perfusion stress test.  We will defer adding aspirin given ongoing anticoagulation with rivaroxiban.  Tobacco abuse: Smoking cessation strongly encouraged.  Shared Decision Making/Informed Consent The risks [chest pain, shortness of breath, cardiac arrhythmias, dizziness, blood pressure fluctuations, myocardial infarction, stroke/transient ischemic attack,  nausea, vomiting, allergic reaction, radiation exposure, metallic taste sensation and life-threatening complications (estimated to be 1 in 10,000)], benefits (risk stratification, diagnosing coronary artery disease, treatment guidance) and alternatives of a nuclear stress test were discussed in detail with Mr. Konigsberg and he agrees to proceed.  The risks (stroke, cardiac arrhythmias rarely resulting in the need for a temporary or permanent pacemaker, skin irritation or burns and complications associated with conscious sedation including aspiration, arrhythmia, respiratory failure and death), benefits (restoration of normal sinus rhythm) and alternatives of a direct current cardioversion were explained in detail to Mr. Frehner and he agrees to proceed.   Follow-up: Return to clinic ~2 weeks after cardioversion.  [name], MD 05/06/2021 2:27 PM

## 2021-05-07 ENCOUNTER — Other Ambulatory Visit: Payer: Self-pay | Admitting: Internal Medicine

## 2021-05-07 ENCOUNTER — Encounter: Payer: Self-pay | Admitting: Internal Medicine

## 2021-05-07 DIAGNOSIS — I4819 Other persistent atrial fibrillation: Secondary | ICD-10-CM | POA: Insufficient documentation

## 2021-05-07 DIAGNOSIS — R0602 Shortness of breath: Secondary | ICD-10-CM | POA: Insufficient documentation

## 2021-05-07 DIAGNOSIS — R079 Chest pain, unspecified: Secondary | ICD-10-CM | POA: Insufficient documentation

## 2021-05-11 ENCOUNTER — Encounter
Admission: RE | Admit: 2021-05-11 | Discharge: 2021-05-11 | Disposition: A | Discharge status: Home / Self Care | Payer: PPO | Source: Ambulatory Visit | Attending: Internal Medicine | Admitting: Internal Medicine

## 2021-05-11 DIAGNOSIS — R079 Chest pain, unspecified: Secondary | ICD-10-CM | POA: Insufficient documentation

## 2021-05-12 ENCOUNTER — Ambulatory Visit
Admission: RE | Admit: 2021-05-12 | Discharge: 2021-05-12 | Disposition: A | Discharge status: Home / Self Care | Payer: PPO | Source: Ambulatory Visit | Attending: Internal Medicine | Admitting: Internal Medicine

## 2021-05-12 DIAGNOSIS — R079 Chest pain, unspecified: Secondary | ICD-10-CM

## 2021-05-12 MED ORDER — TECHNETIUM TC 99M TETROFOSMIN IV KIT
10.0000 | PACK | Freq: Once | INTRAVENOUS | Status: AC | PRN
  Administered 2021-05-12: 10.9 via INTRAVENOUS

## 2021-05-12 MED ORDER — TECHNETIUM TC 99M TETROFOSMIN IV KIT
30.5600 | PACK | Freq: Once | INTRAVENOUS | Status: AC | PRN
  Administered 2021-05-12: 30.56 via INTRAVENOUS

## 2021-05-12 MED ORDER — REGADENOSON 0.4 MG/5ML IV SOLN
0.4000 mg | Freq: Once | INTRAVENOUS | Status: AC
  Administered 2021-05-12: 0.4 mg via INTRAVENOUS

## 2021-05-13 LAB — NM MYOCAR MULTI W/SPECT W/WALL MOTION / EF
LV dias vol: 77 mL (ref 62–150)
LV sys vol: 27 mL
Nuc Stress EF: 65 %
Rest Nuclear Isotope Dose: 10.9 mCi
SDS: 0
SRS: 13
SSS: 0
ST Depression (mm): 0 mm
Stress Nuclear Isotope Dose: 30.6 mCi
TID: 1.12

## 2021-05-20 ENCOUNTER — Ambulatory Visit
Admission: RE | Admit: 2021-05-20 | Discharge: 2021-05-20 | Disposition: A | Discharge status: Home / Self Care | Payer: PPO | Attending: Cardiovascular Disease | Admitting: Cardiovascular Disease

## 2021-05-20 ENCOUNTER — Encounter
Admission: RE | Disposition: A | Discharge status: Home / Self Care | Payer: Self-pay | Source: Home / Self Care | Attending: Cardiovascular Disease

## 2021-05-20 ENCOUNTER — Encounter: Payer: Self-pay | Admitting: Cardiovascular Disease

## 2021-05-20 ENCOUNTER — Ambulatory Visit: Payer: PPO | Admitting: Anesthesiology

## 2021-05-20 ENCOUNTER — Telehealth: Payer: Self-pay

## 2021-05-20 DIAGNOSIS — F17210 Nicotine dependence, cigarettes, uncomplicated: Secondary | ICD-10-CM | POA: Diagnosis not present

## 2021-05-20 DIAGNOSIS — I482 Chronic atrial fibrillation, unspecified: Secondary | ICD-10-CM

## 2021-05-20 DIAGNOSIS — J449 Chronic obstructive pulmonary disease, unspecified: Secondary | ICD-10-CM | POA: Diagnosis not present

## 2021-05-20 DIAGNOSIS — I4819 Other persistent atrial fibrillation: Secondary | ICD-10-CM | POA: Insufficient documentation

## 2021-05-20 DIAGNOSIS — Z79899 Other long term (current) drug therapy: Secondary | ICD-10-CM | POA: Diagnosis not present

## 2021-05-20 DIAGNOSIS — Z7951 Long term (current) use of inhaled steroids: Secondary | ICD-10-CM | POA: Diagnosis not present

## 2021-05-20 DIAGNOSIS — I10 Essential (primary) hypertension: Secondary | ICD-10-CM | POA: Diagnosis not present

## 2021-05-20 DIAGNOSIS — Z794 Long term (current) use of insulin: Secondary | ICD-10-CM | POA: Diagnosis not present

## 2021-05-20 DIAGNOSIS — Z8249 Family history of ischemic heart disease and other diseases of the circulatory system: Secondary | ICD-10-CM | POA: Diagnosis not present

## 2021-05-20 DIAGNOSIS — E1151 Type 2 diabetes mellitus with diabetic peripheral angiopathy without gangrene: Secondary | ICD-10-CM | POA: Diagnosis not present

## 2021-05-20 DIAGNOSIS — I4891 Unspecified atrial fibrillation: Secondary | ICD-10-CM | POA: Diagnosis not present

## 2021-05-20 DIAGNOSIS — M1990 Unspecified osteoarthritis, unspecified site: Secondary | ICD-10-CM | POA: Diagnosis not present

## 2021-05-20 DIAGNOSIS — Z7901 Long term (current) use of anticoagulants: Secondary | ICD-10-CM | POA: Diagnosis not present

## 2021-05-20 DIAGNOSIS — Z886 Allergy status to analgesic agent status: Secondary | ICD-10-CM | POA: Diagnosis not present

## 2021-05-20 DIAGNOSIS — E669 Obesity, unspecified: Secondary | ICD-10-CM | POA: Insufficient documentation

## 2021-05-20 DIAGNOSIS — E785 Hyperlipidemia, unspecified: Secondary | ICD-10-CM | POA: Insufficient documentation

## 2021-05-20 DIAGNOSIS — Z880 Allergy status to penicillin: Secondary | ICD-10-CM | POA: Diagnosis not present

## 2021-05-20 DIAGNOSIS — Z6822 Body mass index (BMI) 22.0-22.9, adult: Secondary | ICD-10-CM | POA: Insufficient documentation

## 2021-05-20 HISTORY — PX: CARDIOVERSION: SHX1299

## 2021-05-20 LAB — GLUCOSE, CAPILLARY: Glucose-Capillary: 115 mg/dL — ABNORMAL HIGH (ref 70–99)

## 2021-05-20 SURGERY — CARDIOVERSION
Anesthesia: General

## 2021-05-20 MED ORDER — PROPOFOL 10 MG/ML IV BOLUS
INTRAVENOUS | Status: AC
  Filled 2021-05-20: qty 20

## 2021-05-20 MED ORDER — PROPOFOL 10 MG/ML IV BOLUS
INTRAVENOUS | Status: DC | PRN
  Administered 2021-05-20: 30 mg via INTRAVENOUS
  Administered 2021-05-20: 40 mg via INTRAVENOUS
  Administered 2021-05-20: 60 mg via INTRAVENOUS

## 2021-05-20 MED ORDER — SODIUM CHLORIDE 0.9 % IV SOLN
INTRAVENOUS | Status: DC | PRN
Start: 2021-05-20 — End: 2021-05-20

## 2021-05-20 NOTE — Telephone Encounter (Signed)
Was able to reach out to Mr. Halseth to f/u on Dr. Marisue Humble message sent this am  Unfortunately, Mr. Stiefel cardioversion was unsuccessful.  Could you arrange for him to have a repeat echo in our office at his earliest convenience as well as refer him to Dr. Quentin Ore for further management of his atrial fibrillation?  Please let me know if any questions or concerns come up.  Thanks.   Mr. Blackshire is agreeable with plan for another echo and an appt with Dr. Quentin Ore, he is aware that our scheduling department will be reaching out to have those dates arrange for pt to come in. Mr. [place] thankful for reaching out and will await phone call from scheduling.   Order for ECHO placed Order for EP referral placed

## 2021-05-20 NOTE — Anesthesia Preprocedure Evaluation (Signed)
Anesthesia Evaluation  Patient identified by MRN, date of birth, ID band Patient awake    Reviewed: Allergy & Precautions, NPO status , Patient's Chart, lab work & pertinent test results  History of Anesthesia Complications Negative for: history of anesthetic complications  Airway Mallampati: II  TM Distance: >3 FB Neck ROM: Full    Dental  (+) Edentulous Upper, Edentulous Lower, Dental Advidsory Given,    Pulmonary shortness of breath and with exertion, asthma , neg sleep apnea, COPD, neg recent URI, Current Smoker,    breath sounds clear to auscultation- rhonchi (-) wheezing      Cardiovascular hypertension, Pt. on medications (-) angina+ Peripheral Vascular Disease  (-) CAD, (-) Past MI, (-) Cardiac Stents and (-) CABG + dysrhythmias Atrial Fibrillation (-) Valvular Problems/Murmurs Rhythm:Regular Rate:Normal - Systolic murmurs and - Diastolic murmurs    Neuro/Psych PSYCHIATRIC DISORDERS Schizophrenia negative neurological ROS     GI/Hepatic PUD, GERD  ,(+) Hepatitis -, C  Endo/Other  diabetes, Insulin Dependent  Renal/GU negative Renal ROS     Musculoskeletal  (+) Arthritis ,   Abdominal (+) - obese,   Peds  Hematology negative hematology ROS (+)   Anesthesia Other Findings Past Medical History: No date: Arthritis No date: Asthma No date: Benign neoplasm of ascending colon     Comment:  polyps No date: Constipation No date: COPD (chronic obstructive pulmonary disease) (HCC)     Comment:  NO INHALER No date: Diabetes mellitus without complication (HCC)     Comment:  Type 2 No date: Diverticulosis No date: Dyspnea     Comment:  RARE-WITH EXERTION DUE TO COPD No date: GERD (gastroesophageal reflux disease) No date: Hepatitis C No date: History of kidney stones     Comment:  H/O No date: Hyperlipidemia 06/10/2018: Hypertension     Comment:  currently not under control No date: Peripheral vascular  disease (HCC) No date: Restless legs No date: Schizoaffective disorder (HCC)     Comment:  Pt denies No date: Thrombocytopenia ([place]) No date: Vitamin D deficiency   Reproductive/Obstetrics                             Anesthesia Physical  Anesthesia Plan  ASA: 3  Anesthesia Plan: General   Post-op Pain Management:    Induction: Intravenous  PONV Risk Score and Plan: 0 and TIVA and Propofol infusion  Airway Management Planned: Natural Airway and Nasal Cannula  Additional Equipment:   Intra-op Plan:   Post-operative Plan:   Informed Consent: I have reviewed the patients History and Physical, chart, labs and discussed the procedure including the risks, benefits and alternatives for the proposed anesthesia with the patient or authorized representative who has indicated his/her understanding and acceptance.     Dental advisory given  Plan Discussed with: CRNA and Anesthesiologist  Anesthesia Plan Comments:         Anesthesia Quick Evaluation

## 2021-05-20 NOTE — Interval H&P Note (Signed)
History and Physical Interval Note:  05/20/2021 7:53 AM  Mark Nash  has presented today for surgery, with the diagnosis of Cardioversion   AFib.  The various methods of treatment have been discussed with the patient and family. After consideration of risks, benefits and other options for treatment, the patient has consented to  Procedure(s): CARDIOVERSION (N/A) as a surgical intervention.  The patient's history has been reviewed, patient examined, no change in status, stable for surgery.  I have reviewed the patient's chart and labs.  Questions were answered to the patient's satisfaction.     Kathlyn [place]

## 2021-05-20 NOTE — Transfer of Care (Signed)
Immediate Anesthesia Transfer of Care Note  Patient: Mark Nash  Procedure(s) Performed: CARDIOVERSION  Patient Location: Short Stay  Anesthesia Type:General  Level of Consciousness: awake  Airway & Oxygen Therapy: Patient Spontanous Breathing and Patient connected to nasal cannula oxygen  Post-op Assessment: Report given to RN and Post -op Vital signs reviewed and stable  Post vital signs: Reviewed and stable  Last Vitals:  Vitals Value Taken Time  BP 126/89 05/20/21 0740  Temp    Pulse 80 05/20/21 0743  Resp 19 05/20/21 0743  SpO2 97 % 05/20/21 0743  Vitals shown include unvalidated device data.  Last Pain:  Vitals:   05/20/21 0647  TempSrc: Oral         Complications: No notable events documented.

## 2021-05-20 NOTE — CV Procedure (Signed)
Cardioversion note: A standard informed consent was obtained. Timeout was performed. The pads were placed in the anterior posterior fashion. The patient was given propofol by the anesthesia team.  Cardioversion initially was done with 150 J shock but he did not convert to sinus.  He converted briefly to sinus rhythm after the second 200 J shock but for less than 1 minute and went back into atrial fibrillation.  He was shocked twice after that and did not convert. Pre-and post EKGs were reviewed. The patient tolerated the procedure with no immediate complications.  Recommendations: Unsuccessful cardioversion.  4 total shocks given.  He only converted after the second shock briefly for less than 1 minute. Consider repeat echocardiogram given that his initial 1 was very suboptimal. Consider EP evaluation.

## 2021-05-23 ENCOUNTER — Telehealth: Payer: Self-pay

## 2021-05-23 NOTE — Telephone Encounter (Signed)
-----   Message from Mark Leavell, RN sent at 05/23/2021  9:03 AM EDT ----- Needs f/u with Dr. Quentin Ore.  Mark Nash  ----- Message ----- From: Mark Epley, MD Sent: 05/22/2021   4:06 PM EDT To: Mark Leavell, RN  Please schedule. CL  ----- Message ----- From: Mark Bush, MD Sent: 05/20/2021  10:30 AM EDT To: Mark Hampshire, MD, Mark Leavell, RN, #  Thanks for your help Donley Redder.  I am sorry his cardioversion was unsuccessful.  I will see if we can get an echo in the office.  If you are able to see him in consultation, Mark Nash, I would greatly appreciated.  Mark Nash ----- Message ----- From: Mark Epley, MD Sent: 05/20/2021   9:56 AM EDT To: Mark Bush, MD, Mark Hampshire, MD, #  I agree. Would repeat his TTE. Happy to see him if that's ok by you Mark Nash? Mark Nash, can you help get him in? Thanks! Mark Nash     ----- Message ----- From: Mark Hampshire, MD Sent: 05/20/2021   8:37 AM EDT To: Mark Bush, MD, Mark Epley, MD  Mark Nash, I do not really understand why cardioversion did not work.  He is not obese and I readjusted the pads and ensured good contact.  I did see P waves after the second shock but it seems that he was in sinus for less than a minute.  He does not have strong symptoms of sleep apnea. I looked at his echocardiogram and it seems that they only obtained subcostal views.  The left atrium was reported to be of normal size but to me it appeared to be moderately to severely dilated.  It might be worth repeating his echocardiogram in our office and have him see Dr. Quentin Ore.

## 2021-05-23 NOTE — Telephone Encounter (Signed)
L mom to schedule 

## 2021-05-24 ENCOUNTER — Encounter: Payer: Self-pay | Admitting: Cardiovascular Disease

## 2021-05-25 ENCOUNTER — Other Ambulatory Visit: Payer: Self-pay

## 2021-05-25 ENCOUNTER — Ambulatory Visit: Payer: PPO | Admitting: Cardiology

## 2021-05-25 VITALS — BP 130/76 | HR 88 | Ht 71.0 in | Wt 169.0 lb

## 2021-05-25 DIAGNOSIS — I10 Essential (primary) hypertension: Secondary | ICD-10-CM

## 2021-05-25 DIAGNOSIS — B182 Chronic viral hepatitis C: Secondary | ICD-10-CM

## 2021-05-25 DIAGNOSIS — I4891 Unspecified atrial fibrillation: Secondary | ICD-10-CM | POA: Diagnosis not present

## 2021-05-25 DIAGNOSIS — I4819 Other persistent atrial fibrillation: Secondary | ICD-10-CM | POA: Diagnosis not present

## 2021-05-25 MED ORDER — METOPROLOL TARTRATE 100 MG PO TABS
ORAL_TABLET | ORAL | 0 refills | Status: DC
Start: 2021-05-25 — End: 2021-07-13

## 2021-05-25 NOTE — Progress Notes (Signed)
Electrophysiology Office Note:    Date:  05/25/2021   ID:  Mark Nash, DOB 09/03/1955, MRN [id number]  PCP:  [name], MD  [facility] Cardiologist:  None  [facility] Electrophysiologist:  Vickie Epley, MD   Referring MD: [name], MD   Chief Complaint: AF  History of Present Illness:    Mark Nash is a 65 y.o. male who presents for an evaluation of AF at the request of Dr End. Their medical history includes COPD, DM, HCV, HTN, HLD.  The patient last saw Dr End for his persistent atrial fibrillation 05/06/2021. Given the symptomatic nature of his AF, he was set up for a cardioversion on 05/20/2021. During the procedure, the cardioversion was only transiently successful. He left the hospital that day in atrial fibrillation.  Mr Kozicki's AF diagnosis goes back to July 2022. He had Covid-19 in August 2022 which prevented early follow up. He is taking xarelto for stroke ppx.  He is symptomatic with his atrial fibrillation with decreased exercise tolerance and shortness of breath.   Past Medical History:  Diagnosis Date   Arthritis    Asthma    Benign neoplasm of ascending colon    polyps   Constipation    COPD (chronic obstructive pulmonary disease) (HCC)    NO INHALER   Diabetes mellitus without complication (HCC)    Type 2   Diverticulosis    Dyspnea    RARE-WITH EXERTION DUE TO COPD   GERD (gastroesophageal reflux disease)    Hepatitis C    History of kidney stones    H/O   Hyperlipidemia    Hypertension 06/10/2018   currently not under control   Left inguinal hernia    Peripheral vascular disease (HCC)    Restless legs    Schizoaffective disorder (HCC)    Pt denies   Thrombocytopenia ([place])    Vitamin D deficiency     Past Surgical History:  Procedure Laterality Date   BACK SURGERY     CARDIOVERSION N/A 05/20/2021   Procedure: CARDIOVERSION;  Surgeon: Wellington Hampshire, MD;  Location: [facility];  Service: Cardiovascular;   Laterality: N/A;   COLONOSCOPY WITH PROPOFOL N/A 09/07/2017   Procedure: COLONOSCOPY WITH PROPOFOL;  Surgeon: Virgel Manifold, MD;  Location: [facility] END[facility];  Service: Endoscopy;  Laterality: N/A;   ESOPHAGOGASTRODUODENOSCOPY (EGD) WITH PROPOFOL N/A 12/07/2017   Procedure: ESOPHAGOGASTRODUODENOSCOPY (EGD) WITH PROPOFOL;  Surgeon: Virgel Manifold, MD;  Location: [facility];  Service: Endoscopy;  Laterality: N/A;   FLEXIBLE SIGMOIDOSCOPY N/A 11/09/2017   Procedure: FLEXIBLE SIGMOIDOSCOPY;  Surgeon: Virgel Manifold, MD;  Location: [facility] END[facility];  Service: Endoscopy;  Laterality: N/A;   HERNIA REPAIR     inguinal   INGUINAL HERNIA REPAIR Bilateral 06/21/2018   Procedure: LAPAROSCOPIC BILATERAL INGUINAL HERNIA REPAIR;  Surgeon: Vickie Epley, MD;  Location: [facility];  Service: General;  Laterality: Bilateral;   L3 TO L5 LAMINECTOMY FOR DECOMPRESSION  07/17/2016   [facility]   REPAIR OF CEREBROSPINAL FLUID LEAK N/A 09/08/2016   Procedure: Lumbar wound exploration, repair of pseudomenigocele;  Surgeon: Kevan Ny Ditty, MD;  Location: [place];  Service: Neurosurgery;  Laterality: N/A;  Lumbar wound exploration, repair of pseudomenigocele   SINUSOTOMY      Current Medications: Current Meds  Medication Sig   diltiazem (DILACOR XR) 240 MG 24 hr capsule Take 1 capsule (240 mg total) by mouth daily.   ergocalciferol (VITAMIN D2) 1.25 MG (50000 UT) capsule Take 50,000  Units by mouth every 14 (fourteen) days.   Fluticasone-Umeclidin-Vilant (TRELEGY ELLIPTA) 100-62.5-25 MCG/INH AEPB Take 1 puff by mouth every evening. (Patient taking differently: Take 1 puff by mouth daily as needed (respiratory issues.).)   gabapentin (NEURONTIN) 300 MG capsule Take 300-600 mg by mouth See admin instructions. Take 1 capsule (300 mg) by mouth in the morning, take 1 capsule (300 mg) by mouth at noon, and 2 capsules (600 mg) by mouth in the evening   insulin detemir (LEVEMIR) 100  UNIT/ML injection Inject 0.06 mLs (6 Units total) into the skin at bedtime.   pravastatin (PRAVACHOL) 20 MG tablet TAKE 1 TABLET BY MOUTH ONCE DAILY WITH SUPPER   rivaroxaban (XARELTO) 20 MG TABS tablet Take 1 tablet (20 mg total) by mouth daily with supper.   tiZANidine (ZANAFLEX) 2 MG tablet Take 2 mg by mouth every 12 (twelve) hours.   traMADol (ULTRAM) 50 MG tablet Take 50 mg by mouth every 12 (twelve) hours.     Allergies:   Penicillins, Aspirin, Ibuprofen, Lyrica [pregabalin], and Acetaminophen   Social History   Socioeconomic History   Marital status: Divorced    Spouse name: Not on file   Number of children: 2   Years of education: Not on file   Highest education level: 12th grade  Occupational History   Occupation: Disabled  Tobacco Use   Smoking status: Every Day    Packs/day: 1.50    Years: 46.00    Pack years: 69.00    Types: Cigarettes   Smokeless tobacco: Never  Vaping Use   Vaping Use: Never used  Substance and Sexual Activity   Alcohol use: No    Alcohol/week: 0.0 standard drinks   Drug use: No   Sexual activity: Not Currently  Other Topics Concern   Not on file  Social History Narrative   Lives alone   Social Determinants of Health   Financial Resource Strain: Low Risk    Difficulty of Paying Living Expenses: Not hard at all  Food Insecurity: No Food Insecurity   Worried About Charity fundraiser in the Last Year: Never true   [place] in the Last Year: Never true  Transportation Needs: No Transportation Needs   Lack of Transportation (Medical): No   Lack of Transportation (Non-Medical): No  Physical Activity: Insufficiently Active   Days of Exercise per Week: 2 days   Minutes of Exercise per Session: 20 min  Stress: No Stress Concern Present   Feeling of Stress : Not at all  Social Connections: Socially Isolated   Frequency of Communication with Friends and Family: More than three times a week   Frequency of Social Gatherings with  Friends and Family: Three times a week   Attends Religious Services: Never   Active Member of Clubs or Organizations: No   Attends Archivist Meetings: Never   Marital Status: Divorced     Family History: The patient's family history includes Colitis in his mother; Diabetes in his father and sister; Diverticulitis in his brother and mother; Heart disease in his father; Heart failure in his mother; Hypertension in his father and mother.  ROS:   Please see the history of present illness.    All other systems reviewed and are negative.  EKGs/Labs/Other Studies Reviewed:    The following studies were reviewed today:   05/12/2021 SPECT Pharmacological myocardial perfusion imaging study with no significant  ischemia Attenuation corrected images with small fixed defect in the apical region, possible artifact  though unable to exclude small scar This small fixed defect not noted on nonattenuation corrected images.  Normal wall motion, EF estimated at 59% Resting EKG with atrial fibrillation No EKG changes concerning for ischemia at peak stress or in recovery. CT attenuation correction images with moderate three-vessel coronary calcification Low risk scan    04/20/2021 Echo LVEF 55% RV normal Trivial MR    EKG:  The ekg ordered today demonstrates atrial fibrillation. V rate 88 bpm.  Recent Labs: 09/15/2020: ALT 14 04/19/2021: Magnesium 2.0; TSH 1.706 04/21/2021: BUN 16; Creatinine, Ser 0.70; Potassium 3.8; Sodium 134 04/22/2021: Hemoglobin 15.9; Platelets 132  Recent Lipid Panel    Component Value Date/Time   CHOL 121 09/15/2020 1500   CHOL 112 07/13/2015 1018   TRIG 76 09/15/2020 1500   HDL 51 09/15/2020 1500   HDL 62 07/13/2015 1018   CHOLHDL 2.4 09/15/2020 1500   VLDL 12 12/15/2016 1405   LDLCALC 54 09/15/2020 1500    Physical Exam:    VS:  BP 130/76   Pulse 88   Ht 5\' 11"  (1.803 m)   Wt 169 lb (76.7 kg)   SpO2 96%   BMI 23.57 kg/m     Wt Readings from  Last 3 Encounters:  05/25/21 169 lb (76.7 kg)  05/20/21 164 lb (74.4 kg)  05/06/21 162 lb (73.5 kg)     GEN:  Well nourished, well developed in no acute distress HEENT: Normal NECK: No JVD; No carotid bruits LYMPHATICS: No lymphadenopathy CARDIAC: irregularly irregular, no murmurs, rubs, gallops RESPIRATORY:  Clear to auscultation without rales, wheezing or rhonchi  ABDOMEN: Soft, non-tender, non-distended MUSCULOSKELETAL:  No edema; No deformity  SKIN: Warm and dry NEUROLOGIC:  Alert and oriented x 3 PSYCHIATRIC:  Normal affect   ASSESSMENT:    1. Persistent atrial fibrillation ([place])   2. Chronic hepatitis C without hepatic coma ([place])   3. Essential (primary) hypertension    PLAN:    In order of problems listed above:  1. Persistent atrial fibrillation (HCC) Symptomatic. Discussed the treatment options including conservative management, rhythm control using antiarrhythmic drugs and rhythm control using ablation. He would like to pursue ablation in an effort to avoid long term use of medications. I discussed the ablation procedure in detail with the patient including the risks, efficacy and recovery and he wishes to proceed.   Risk, benefits, and alternatives to EP study and radiofrequency ablation for afib were also discussed in detail today. These risks include but are not limited to stroke, bleeding, vascular damage, tamponade, perforation, damage to the esophagus, lungs, and other structures, pulmonary vein stenosis, worsening renal function, and death. The patient understands these risk and wishes to proceed.  We will therefore proceed with catheter ablation at the next available time.  Carto, ICE, anesthesia are requested for the procedure.  Will also obtain CT PV protocol prior to the procedure to exclude LAA thrombus and further evaluate atrial anatomy.   2. Chronic hepatitis C without hepatic coma ([place])  3. Essential (primary) hypertension Controlled today. Continue  current regimen.        Total time spent with patient today 65 minutes. This includes reviewing records, evaluating the patient and coordinating care.  Medication Adjustments/Labs and Tests Ordered: Current medicines are reviewed at length with the patient today.  Concerns regarding medicines are outlined above.  Orders Placed This Encounter  Procedures   CT CARDIAC MORPH/PULM VEIN W/CM&W/O CA SCORE   Basic Metabolic Panel (BMET)   CBC w/Diff  EKG 12-Lead   No orders of the defined types were placed in this encounter.    Signed, Hilton Cork. Quentin Ore, MD, [facility], [facility] 05/25/2021 11:03 PM    Electrophysiology [facility]

## 2021-05-25 NOTE — Patient Instructions (Addendum)
Medication Instructions:  Your physician recommends that you continue on your current medications as directed. Please refer to the Current Medication list given to you today. *If you need a refill on your cardiac medications before your next appointment, please call your pharmacy*  Lab Work: You will get lab work today:  CBC and BMP If you have labs (blood work) drawn today and your tests are completely normal, you will receive your results only by: MyChart Message (if you have MyChart) OR A paper copy in the mail If you have any lab test that is abnormal or we need to change your treatment, we will call you to review the results.  Testing/Procedures: Your physician has requested that you have cardiac CT. Cardiac computed tomography (CT) is a painless test that uses an x-ray machine to take clear, detailed pictures of your heart.   Your physician has recommended that you have an ablation. Catheter ablation is a medical procedure used to treat some cardiac arrhythmias (irregular heartbeats). During catheter ablation, a long, thin, flexible tube is put into a blood vessel in your groin (upper thigh), or neck. This tube is called an ablation catheter. It is then guided to your heart through the blood vessel. Radio frequency waves destroy small areas of heart tissue where abnormal heartbeats may cause an arrhythmia to start. Please see the instruction sheet given to you today.    Follow-Up: Your next appointment:     Afib ablation 06/09/2021   You will follow up with the afib clinic 4 weeks after your procedure.   Your physician wants you to follow-up in: 3 months with Dr. Quentin Ore post procedure.  Cardiac Ablation Cardiac ablation is a procedure to destroy, or ablate, a small amount of heart tissue in very specific places. The heart has many electrical connections. Sometimes these connections are abnormal and can cause the heart to beat very fast or irregularly. Ablating some of the areas that  cause problems can improve the heart's rhythm or return it to normal. Ablation may be done for people who: Have Wolff-Parkinson-White syndrome. Have fast heart rhythms (tachycardia). Have taken medicines for an abnormal heart rhythm (arrhythmia) that were not effective or caused side effects. Have a high-risk heartbeat that may be life-threatening. During the procedure, a small incision is made in the neck or the groin, and a long, thin tube (catheter) is inserted into the incision and moved to the heart. Small devices (electrodes) on the tip of the catheter will send out electrical currents. A type of X-ray (fluoroscopy) will be used to help guide the catheter and to provide images of the heart. Tell a health care provider about: Any allergies you have. All medicines you are taking, including vitamins, herbs, eye drops, creams, and over-the-counter medicines. Any problems you or family members have had with anesthetic medicines. Any blood disorders you have. Any surgeries you have had. Any medical conditions you have, such as kidney failure. Whether you are pregnant or may be pregnant. What are the risks? Generally, this is a safe procedure. However, problems may occur, including: Infection. Bruising and bleeding at the catheter insertion site. Bleeding into the chest, especially into the sac that surrounds the heart. This is a serious complication. Stroke or blood clots. Damage to nearby structures or organs. Allergic reaction to medicines or dyes. Need for a permanent pacemaker if the normal electrical system is damaged. A pacemaker is a small computer that sends electrical signals to the heart and helps your heart beat normally. The  procedure not being fully effective. This may not be recognized until months later. Repeat ablation procedures are sometimes done. What happens before the procedure? Medicines Ask your health care provider about: Changing or stopping your regular medicines.  This is especially important if you are taking diabetes medicines or blood thinners. Taking medicines such as aspirin and ibuprofen. These medicines can thin your blood. Do not take these medicines unless your health care provider tells you to take them. Taking over-the-counter medicines, vitamins, herbs, and supplements. General instructions Follow instructions from your health care provider about eating or drinking restrictions. Plan to have someone take you home from the hospital or clinic. If you will be going home right after the procedure, plan to have someone with you for 24 hours. Ask your health care provider what steps will be taken to prevent infection. What happens during the procedure?  An IV will be inserted into one of your veins. You will be given a medicine to help you relax (sedative). The skin on your neck or groin will be numbed. An incision will be made in your neck or your groin. A needle will be inserted through the incision and into a large vein in your neck or groin. A catheter will be inserted into the needle and moved to your heart. Dye may be injected through the catheter to help your surgeon see the area of the heart that needs treatment. Electrical currents will be sent from the catheter to ablate heart tissue in desired areas. There are three types of energy that may be used to do this: Heat (radiofrequency energy). Laser energy. Extreme cold (cryoablation). When the tissue has been ablated, the catheter will be removed. Pressure will be held on the insertion area to prevent a lot of bleeding. A bandage (dressing) will be placed over the insertion area. The exact procedure may vary among health care providers and hospitals. What happens after the procedure? Your blood pressure, heart rate, breathing rate, and blood oxygen level will be monitored until you leave the hospital or clinic. Your insertion area will be monitored for bleeding. You will need to lie  still for a few hours to ensure that you do not bleed from the insertion area. Do not drive for 24 hours or as long as told by your health care provider. Summary Cardiac ablation is a procedure to destroy, or ablate, a small amount of heart tissue using an electrical current. This procedure can improve the heart rhythm or return it to normal. Tell your health care provider about any medical conditions you may have and all medicines you are taking to treat them. This is a safe procedure, but problems may occur. Problems may include infection, bruising, damage to nearby organs or structures, or allergic reactions to medicines. Follow your health care provider's instructions about eating and drinking before the procedure. You may also be told to change or stop some of your medicines. After the procedure, do not drive for 24 hours or as long as told by your health care provider. This information is not intended to replace advice given to you by your health care provider. Make sure you discuss any questions you have with your health care provider. Document Revised: 06/23/2019 Document Reviewed: 06/23/2019 Elsevier Patient Education  [place].

## 2021-05-26 LAB — BASIC METABOLIC PANEL
BUN/Creatinine Ratio: 14 (ref 10–24)
BUN: 11 mg/dL (ref 8–27)
CO2: 25 mmol/L (ref 20–29)
Calcium: 8.8 mg/dL (ref 8.6–10.2)
Chloride: 102 mmol/L (ref 96–106)
Creatinine, Ser: 0.77 mg/dL (ref 0.76–1.27)
Glucose: 143 mg/dL — ABNORMAL HIGH (ref 70–99)
Potassium: 4.3 mmol/L (ref 3.5–5.2)
Sodium: 140 mmol/L (ref 134–144)
eGFR: 99 mL/min/{1.73_m2} (ref >59)

## 2021-05-26 LAB — CBC WITH DIFFERENTIAL/PLATELET
Basophils Absolute: 0 10*3/uL (ref 0.0–0.2)
Basos: 1 %
EOS (ABSOLUTE): 0.2 10*3/uL (ref 0.0–0.4)
Eos: 2 %
Hematocrit: 43.3 % (ref 37.5–51.0)
Hemoglobin: 15.2 g/dL (ref 13.0–17.7)
Immature Grans (Abs): 0 10*3/uL (ref 0.0–0.1)
Immature Granulocytes: 0 %
Lymphocytes Absolute: 1.7 10*3/uL (ref 0.7–3.1)
Lymphs: 25 %
MCH: 31.5 pg (ref 26.6–33.0)
MCHC: 35.1 g/dL (ref 31.5–35.7)
MCV: 90 fL (ref 79–97)
Monocytes Absolute: 0.6 10*3/uL (ref 0.1–0.9)
Monocytes: 9 %
Neutrophils Absolute: 4.2 10*3/uL (ref 1.4–7.0)
Neutrophils: 63 %
Platelets: 167 10*3/uL (ref 150–450)
RBC: 4.83 x10E6/uL (ref 4.14–5.80)
RDW: 12.2 % (ref 11.6–15.4)
WBC: 6.7 10*3/uL (ref 3.4–10.8)

## 2021-05-27 ENCOUNTER — Other Ambulatory Visit: Payer: PPO

## 2021-05-27 NOTE — Anesthesia Postprocedure Evaluation (Signed)
Anesthesia Post Note  Patient: Mark Nash  Procedure(s) Performed: CARDIOVERSION  Patient location during evaluation: Specials Recovery Anesthesia Type: General Level of consciousness: awake and alert Pain management: pain level controlled Vital Signs Assessment: post-procedure vital signs reviewed and stable Respiratory status: spontaneous breathing, nonlabored ventilation, respiratory function stable and patient connected to nasal cannula oxygen Cardiovascular status: blood pressure returned to baseline and stable Postop Assessment: no apparent nausea or vomiting Anesthetic complications: no   No notable events documented.   Last Vitals:  Vitals:   05/20/21 0815 05/20/21 0830  BP: (!) 149/96 138/86  Pulse: 78 78  Resp: 16 17  Temp:    SpO2: 98% 98%    Last Pain:  Vitals:   05/20/21 0830  TempSrc:   PainSc: 7                  Martha Clan

## 2021-06-01 ENCOUNTER — Telehealth (HOSPITAL_COMMUNITY): Payer: Self-pay | Admitting: Emergency Medicine

## 2021-06-01 NOTE — Telephone Encounter (Signed)
Reaching out to patient to offer assistance regarding upcoming cardiac imaging study; pt verbalizes understanding of appt date/time, parking situation and where to check in, pre-test NPO status and medications ordered, and verified current allergies; name and call back number provided for further questions should they arise Marchia Bond RN Navigator Cardiac Imaging [facility] [phone] office [phone] cell  100mg  metop 2h pta  Denies iv issues Denies claustr

## 2021-06-01 NOTE — Telephone Encounter (Signed)
Calling patient to clarify instructions for upcoming CTA. Pt states he has clear understanding of CT instructions   But he received a phone call from a [place] number regarding his appt on 10/13 for ablation procedure and caused confusion. Sounded like he got a call from the pre-service center. He said he would wait for them to call back if they needed anything but plans to attend CT appt and ablation appt as scheduled.  Pt denied further questions  Marchia Bond RN Navigator Cardiac Imaging [facility] [phone] Office  [phone] Cell

## 2021-06-01 NOTE — Telephone Encounter (Signed)
Patient calling States he is confused on instructions given for upcoming procedure Please call to clarify

## 2021-06-02 ENCOUNTER — Ambulatory Visit
Admission: RE | Admit: 2021-06-02 | Discharge: 2021-06-02 | Disposition: A | Discharge status: Home / Self Care | Payer: PPO | Source: Ambulatory Visit | Attending: Cardiology | Admitting: Cardiology

## 2021-06-02 ENCOUNTER — Other Ambulatory Visit: Payer: Self-pay

## 2021-06-02 DIAGNOSIS — I4819 Other persistent atrial fibrillation: Secondary | ICD-10-CM | POA: Diagnosis not present

## 2021-06-02 MED ORDER — IOHEXOL 350 MG/ML SOLN
75.0000 mL | Freq: Once | INTRAVENOUS | Status: AC | PRN
  Administered 2021-06-02: 75 mL via INTRAVENOUS

## 2021-06-03 ENCOUNTER — Ambulatory Visit: Payer: PPO | Admitting: Medical

## 2021-06-08 NOTE — Pre-Procedure Instructions (Signed)
Instructed patient on the following items: Arrival time 0800 Nothing to eat or drink after midnight No meds AM of procedure Responsible person to drive you home and stay with you for 24 hrs  Have you missed any doses of anti-coagulant Xarelto- hasn't missed any doses    

## 2021-06-09 ENCOUNTER — Encounter (HOSPITAL_COMMUNITY)
Admission: RE | Disposition: A | Discharge status: Home / Self Care | Payer: Self-pay | Source: Home / Self Care | Attending: Cardiology

## 2021-06-09 ENCOUNTER — Encounter (HOSPITAL_COMMUNITY): Payer: Self-pay | Admitting: Anesthesiology

## 2021-06-09 ENCOUNTER — Ambulatory Visit (HOSPITAL_COMMUNITY)
Admission: RE | Admit: 2021-06-09 | Discharge: 2021-06-09 | Disposition: A | Discharge status: Home / Self Care | Payer: PPO | Attending: Cardiology | Admitting: Cardiology

## 2021-06-09 ENCOUNTER — Other Ambulatory Visit: Payer: PPO

## 2021-06-09 ENCOUNTER — Other Ambulatory Visit: Payer: Self-pay

## 2021-06-09 DIAGNOSIS — I10 Essential (primary) hypertension: Secondary | ICD-10-CM | POA: Diagnosis not present

## 2021-06-09 DIAGNOSIS — Z79899 Other long term (current) drug therapy: Secondary | ICD-10-CM | POA: Insufficient documentation

## 2021-06-09 DIAGNOSIS — J449 Chronic obstructive pulmonary disease, unspecified: Secondary | ICD-10-CM | POA: Insufficient documentation

## 2021-06-09 DIAGNOSIS — Z8616 Personal history of COVID-19: Secondary | ICD-10-CM | POA: Diagnosis not present

## 2021-06-09 DIAGNOSIS — I4819 Other persistent atrial fibrillation: Secondary | ICD-10-CM | POA: Insufficient documentation

## 2021-06-09 DIAGNOSIS — F17210 Nicotine dependence, cigarettes, uncomplicated: Secondary | ICD-10-CM | POA: Diagnosis not present

## 2021-06-09 DIAGNOSIS — Z888 Allergy status to other drugs, medicaments and biological substances status: Secondary | ICD-10-CM | POA: Insufficient documentation

## 2021-06-09 DIAGNOSIS — Z794 Long term (current) use of insulin: Secondary | ICD-10-CM | POA: Insufficient documentation

## 2021-06-09 DIAGNOSIS — B182 Chronic viral hepatitis C: Secondary | ICD-10-CM | POA: Diagnosis not present

## 2021-06-09 DIAGNOSIS — Z886 Allergy status to analgesic agent status: Secondary | ICD-10-CM | POA: Insufficient documentation

## 2021-06-09 DIAGNOSIS — Z7901 Long term (current) use of anticoagulants: Secondary | ICD-10-CM | POA: Diagnosis not present

## 2021-06-09 DIAGNOSIS — Z539 Procedure and treatment not carried out, unspecified reason: Secondary | ICD-10-CM | POA: Diagnosis not present

## 2021-06-09 DIAGNOSIS — E785 Hyperlipidemia, unspecified: Secondary | ICD-10-CM | POA: Diagnosis not present

## 2021-06-09 DIAGNOSIS — Z880 Allergy status to penicillin: Secondary | ICD-10-CM | POA: Insufficient documentation

## 2021-06-09 DIAGNOSIS — E119 Type 2 diabetes mellitus without complications: Secondary | ICD-10-CM | POA: Insufficient documentation

## 2021-06-09 LAB — GLUCOSE, CAPILLARY: Glucose-Capillary: 119 mg/dL — ABNORMAL HIGH (ref 70–99)

## 2021-06-09 SURGERY — ATRIAL FIBRILLATION ABLATION
Anesthesia: General

## 2021-06-09 MED ORDER — SULFAMETHOXAZOLE-TRIMETHOPRIM 800-160 MG PO TABS: 1.0000 | ORAL_TABLET | Freq: Two times a day (BID) | ORAL | 0 refills | Status: AC

## 2021-06-09 MED ORDER — SODIUM CHLORIDE 0.9 % IV SOLN: INTRAVENOUS | Status: DC

## 2021-06-09 NOTE — Progress Notes (Signed)
Right lower leg with redness and swelling noted. States its been like this for 2 weeks. Renee, PA informed.

## 2021-06-09 NOTE — H&P (Signed)
Electrophysiology Office Note:     Date:  05/25/2021    ID:  Mark Nash, DOB Sep 13, 1955, MRN [id number]   PCP:  [name], MD           [facility] Cardiologist:  None  [facility] Electrophysiologist:  Vickie Epley, MD    Referring MD: [name], MD    Chief Complaint: AF   History of Present Illness:     Mark Nash is a 65 y.o. male who presents for an evaluation of AF at the request of Dr End. Their medical history includes COPD, DM, HCV, HTN, HLD.   The patient last saw Dr End for his persistent atrial fibrillation 05/06/2021. Given the symptomatic nature of his AF, he was set up for a cardioversion on 05/20/2021. During the procedure, the cardioversion was only transiently successful. He left the hospital that day in atrial fibrillation.   Mark Nash's AF diagnosis goes back to July 2022. He had Covid-19 in August 2022 which prevented early follow up. He is taking xarelto for stroke ppx.   He is symptomatic with his atrial fibrillation with decreased exercise tolerance and shortness of breath.        Past Medical History:  Diagnosis Date   Arthritis     Asthma     Benign neoplasm of ascending colon      polyps   Constipation     COPD (chronic obstructive pulmonary disease) (HCC)      NO INHALER   Diabetes mellitus without complication (HCC)      Type 2   Diverticulosis     Dyspnea      RARE-WITH EXERTION DUE TO COPD   GERD (gastroesophageal reflux disease)     Hepatitis C     History of kidney stones      H/O   Hyperlipidemia     Hypertension 06/10/2018    currently not under control   Left inguinal hernia     Peripheral vascular disease (HCC)     Restless legs     Schizoaffective disorder (HCC)      Pt denies   Thrombocytopenia ([place])     Vitamin D deficiency             Past Surgical History:  Procedure Laterality Date   BACK SURGERY       CARDIOVERSION N/A 05/20/2021    Procedure: CARDIOVERSION;  Surgeon: Wellington Hampshire,  MD;  Location: [facility];  Service: Cardiovascular;  Laterality: N/A;   COLONOSCOPY WITH PROPOFOL N/A 09/07/2017    Procedure: COLONOSCOPY WITH PROPOFOL;  Surgeon: Virgel Manifold, MD;  Location: [facility] ENDOSCOPY;  Service: Endoscopy;  Laterality: N/A;   ESOPHAGOGASTRODUODENOSCOPY (EGD) WITH PROPOFOL N/A 12/07/2017    Procedure: ESOPHAGOGASTRODUODENOSCOPY (EGD) WITH PROPOFOL;  Surgeon: Virgel Manifold, MD;  Location: [facility] ENDOSCOPY;  Service: Endoscopy;  Laterality: N/A;   FLEXIBLE SIGMOIDOSCOPY N/A 11/09/2017    Procedure: FLEXIBLE SIGMOIDOSCOPY;  Surgeon: Virgel Manifold, MD;  Location: [facility] ENDOSCOPY;  Service: Endoscopy;  Laterality: N/A;   HERNIA REPAIR        inguinal   INGUINAL HERNIA REPAIR Bilateral 06/21/2018    Procedure: LAPAROSCOPIC BILATERAL INGUINAL HERNIA REPAIR;  Surgeon: Vickie Epley, MD;  Location: [facility] OR[facility];  Service: General;  Laterality: Bilateral;   L3 TO L5 LAMINECTOMY FOR DECOMPRESSION   07/17/2016    [facility]   REPAIR OF CEREBROSPINAL FLUID LEAK N/A 09/08/2016    Procedure: Lumbar wound exploration, repair of pseudomenigocele;  Surgeon: Kevan Ny Ditty, MD;  Location: [place];  Service: Neurosurgery;  Laterality: N/A;  Lumbar wound exploration, repair of pseudomenigocele   SINUSOTOMY          Current Medications: Active Medications      Current Meds  Medication Sig   diltiazem (DILACOR XR) 240 MG 24 hr capsule Take 1 capsule (240 mg total) by mouth daily.   ergocalciferol (VITAMIN D2) 1.25 MG (50000 UT) capsule Take 50,000 Units by mouth every 14 (fourteen) days.   Fluticasone-Umeclidin-Vilant (TRELEGY ELLIPTA) 100-62.5-25 MCG/INH AEPB Take 1 puff by mouth every evening. (Patient taking differently: Take 1 puff by mouth daily as needed (respiratory issues.).)   gabapentin (NEURONTIN) 300 MG capsule Take 300-600 mg by mouth See admin instructions. Take 1 capsule (300 mg) by mouth in the morning, take 1 capsule (300 mg) by mouth  at noon, and 2 capsules (600 mg) by mouth in the evening   insulin detemir (LEVEMIR) 100 UNIT/ML injection Inject 0.06 mLs (6 Units total) into the skin at bedtime.   pravastatin (PRAVACHOL) 20 MG tablet TAKE 1 TABLET BY MOUTH ONCE DAILY WITH SUPPER   rivaroxaban (XARELTO) 20 MG TABS tablet Take 1 tablet (20 mg total) by mouth daily with supper.   tiZANidine (ZANAFLEX) 2 MG tablet Take 2 mg by mouth every 12 (twelve) hours.   traMADol (ULTRAM) 50 MG tablet Take 50 mg by mouth every 12 (twelve) hours.        Allergies:   Penicillins, Aspirin, Ibuprofen, Lyrica [pregabalin], and Acetaminophen    Social History         Socioeconomic History   Marital status: Divorced      Spouse name: Not on file   Number of children: 2   Years of education: Not on file   Highest education level: 12th grade  Occupational History   Occupation: Disabled  Tobacco Use   Smoking status: Every Day      Packs/day: 1.50      Years: 46.00      Pack years: 69.00      Types: Cigarettes   Smokeless tobacco: Never  Vaping Use   Vaping Use: Never used  Substance and Sexual Activity   Alcohol use: No      Alcohol/week: 0.0 standard drinks   Drug use: No   Sexual activity: Not Currently  Other Topics Concern   Not on file  Social History Narrative    Lives alone    Social Determinants of Health       Financial Resource Strain: Low Risk    Difficulty of Paying Living Expenses: Not hard at all  Food Insecurity: No Food Insecurity   Worried About Charity fundraiser in the Last Year: Never true   [place] in the Last Year: Never true  Transportation Needs: No Transportation Needs   Lack of Transportation (Medical): No   Lack of Transportation (Non-Medical): No  Physical Activity: Insufficiently Active   Days of Exercise per Week: 2 days   Minutes of Exercise per Session: 20 min  Stress: No Stress Concern Present   Feeling of Stress : Not at all  Social Connections: Socially Isolated    Frequency of Communication with Friends and Family: More than three times a week   Frequency of Social Gatherings with Friends and Family: Three times a week   Attends Religious Services: Never   Active Member of Clubs or Organizations: No   Attends Archivist Meetings: Never  Marital Status: Divorced      Family History: The patient's family history includes Colitis in his mother; Diabetes in his father and sister; Diverticulitis in his brother and mother; Heart disease in his father; Heart failure in his mother; Hypertension in his father and mother.   ROS:   Please see the history of present illness.    All other systems reviewed and are negative.   EKGs/Labs/Other Studies Reviewed:     The following studies were reviewed today:     06-03-2021 SPECT Pharmacological myocardial perfusion imaging study with no significant  ischemia Attenuation corrected images with small fixed defect in the apical region, possible artifact though unable to exclude small scar This small fixed defect not noted on nonattenuation corrected images.  Normal wall motion, EF estimated at 59% Resting EKG with atrial fibrillation No EKG changes concerning for ischemia at peak stress or in recovery. CT attenuation correction images with moderate three-vessel coronary calcification Low risk scan     04/20/2021 Echo LVEF 55% RV normal Trivial Mark       EKG:  The ekg ordered today demonstrates atrial fibrillation. V rate 88 bpm.   Recent Labs: 09/15/2020: ALT 14 04/19/2021: Magnesium 2.0; TSH 1.706 04/21/2021: BUN 16; Creatinine, Ser 0.70; Potassium 3.8; Sodium 134 04/22/2021: Hemoglobin 15.9; Platelets 132  Recent Lipid Panel Labs (Brief)          Component Value Date/Time    CHOL 121 09/15/2020 1500    CHOL 112 07/13/2015 1018    TRIG 76 09/15/2020 1500    HDL 51 09/15/2020 1500    HDL 62 07/13/2015 1018    CHOLHDL 2.4 09/15/2020 1500    VLDL 12 12/15/2016 1405    LDLCALC 54  09/15/2020 1500        Physical Exam:     VS:  BP 130/76   Pulse 88   Ht 5\' 11"  (1.803 m)   Wt 169 lb (76.7 kg)   SpO2 96%   BMI 23.57 kg/m         Wt Readings from Last 3 Encounters:  05/25/21 169 lb (76.7 kg)  05/20/21 164 lb (74.4 kg)  05/06/21 162 lb (73.5 kg)      GEN:  Well nourished, well developed in no acute distress HEENT: Normal NECK: No JVD; No carotid bruits LYMPHATICS: No lymphadenopathy CARDIAC: irregularly irregular, no murmurs, rubs, gallops RESPIRATORY:  Clear to auscultation without rales, wheezing or rhonchi  ABDOMEN: Soft, non-tender, non-distended MUSCULOSKELETAL:  No edema; No deformity  SKIN: Warm and dry NEUROLOGIC:  Alert and oriented x 3 PSYCHIATRIC:  Normal affect    ASSESSMENT:     1. Persistent atrial fibrillation ([place])   2. Chronic hepatitis C without hepatic coma ([place])   3. Essential (primary) hypertension     PLAN:     In order of problems listed above:   1. Persistent atrial fibrillation (HCC) Symptomatic. Discussed the treatment options including conservative management, rhythm control using antiarrhythmic drugs and rhythm control using ablation. He would like to pursue ablation in an effort to avoid long term use of medications. I discussed the ablation procedure in detail with the patient including the risks, efficacy and recovery and he wishes to proceed.     Risk, benefits, and alternatives to EP study and radiofrequency ablation for afib were also discussed in detail today. These risks include but are not limited to stroke, bleeding, vascular damage, tamponade, perforation, damage to the esophagus, lungs, and other structures, pulmonary vein stenosis, worsening renal  function, and death. The patient understands these risk and wishes to proceed.  We will therefore proceed with catheter ablation at the next available time.  Carto, ICE, anesthesia are requested for the procedure.  Will also obtain CT PV protocol prior to the  procedure to exclude LAA thrombus and further evaluate atrial anatomy.     2. Chronic hepatitis C without hepatic coma ([place])   3. Essential (primary) hypertension Controlled today. Continue current regimen.               Total time spent with patient today 65 minutes. This includes reviewing records, evaluating the patient and coordinating care.   Medication Adjustments/Labs and Tests Ordered: Current medicines are reviewed at length with the patient today.  Concerns regarding medicines are outlined above.     Orders Placed This Encounter  Procedures   CT CARDIAC MORPH/PULM VEIN W/CM&W/O CA SCORE   Basic Metabolic Panel (BMET)   CBC w/Diff   EKG 12-Lead    No orders of the defined types were placed in this encounter.       Signed, Hilton Cork. Quentin Ore, MD, [facility], [facility] 05/25/2021 11:03 PM    Electrophysiology [facility]    ----------------------------------------------------------------   I have seen, examined the patient, and reviewed the above assessment and plan.    Mark Nash presented for his atrial fibrillation ablation today.  He tells me for the last 2 weeks he is experiencing worsening right lower extremity swelling and erythema.  He is try to manage it at home by elevating the leg but this is only been partially successful.  Physical exam today is significant for significant erythema and swelling of the right lower extremity.  Its appearance is consistent with a diagnosis of cellulitis.  I do not see any open wounds.  Given the active infection in the right lower extremity, I do not think it is a good idea to proceed with the ablation today.  I discussed this at length with the patient who is in agreement.  I will prescribe him antibiotics today.  I would like him to be seen by his primary care physician in the next few days to make sure this is improving.  I will send a note to them today.  I will have our office reach out to the patient to  reschedule his ablation to a later date.  Vickie Epley, MD 06/09/2021 2:26 PM

## 2021-06-09 NOTE — Progress Notes (Signed)
Reviewed with pt discharge instructions voices understanding. Instructed pt to start antibiotics voices understanding.

## 2021-06-09 NOTE — Progress Notes (Addendum)
Patient reports 2 weeks of progressive R calf/lower leg swelling and erythema and tenderness. No trauma, no bites that he is aware of. He has some chronic looking skin changes b/l LE, R>l shin that he reports he tends to rub/hit regularly on the running board of his car, but no open wounds ever.  Generalized erythema to RLE below knee, swollen, tense and tender diffusely He denies pain or difficulty with ambulation He reports compliance with his xarelto which make thrombus less likely and more likely a cellulitis.  He denies fever, symptoms of illness in any way, does not appear ill  I have discussed with the patient I think best to reschedule his procedure though will await Dr. Mardene Speak final decision.  Tommye Standard, PA-C

## 2021-06-10 ENCOUNTER — Ambulatory Visit
Admission: RE | Admit: 2021-06-10 | Discharge: 2021-06-10 | Disposition: A | Discharge status: Home / Self Care | Payer: PPO | Source: Ambulatory Visit | Attending: Family Medicine | Admitting: Family Medicine

## 2021-06-10 ENCOUNTER — Telehealth: Payer: Self-pay | Admitting: Family Medicine

## 2021-06-10 ENCOUNTER — Telehealth: Payer: Self-pay | Admitting: *Deleted

## 2021-06-10 ENCOUNTER — Ambulatory Visit (INDEPENDENT_AMBULATORY_CARE_PROVIDER_SITE_OTHER): Payer: PPO | Admitting: Family Medicine

## 2021-06-10 ENCOUNTER — Telehealth: Payer: Self-pay

## 2021-06-10 ENCOUNTER — Encounter: Payer: Self-pay | Admitting: Family Medicine

## 2021-06-10 VITALS — BP 120/82 | HR 85 | Temp 98.0°F | Resp 16 | Ht 71.0 in | Wt 166.0 lb

## 2021-06-10 DIAGNOSIS — M7989 Other specified soft tissue disorders: Secondary | ICD-10-CM | POA: Diagnosis not present

## 2021-06-10 DIAGNOSIS — M79661 Pain in right lower leg: Secondary | ICD-10-CM | POA: Insufficient documentation

## 2021-06-10 DIAGNOSIS — R600 Localized edema: Secondary | ICD-10-CM | POA: Diagnosis not present

## 2021-06-10 NOTE — Telephone Encounter (Signed)
[place] [facility]  Calling STAT result for Dr Herma Mering is closed for lunch- message taken  R leg doppler for DVT screening-  negative result

## 2021-06-10 NOTE — Progress Notes (Signed)
Name: Mark Nash   MRN: [id number]    DOB: 08/04/1956   Date:06/10/2021       Progress Note  Subjective  Chief Complaint  Swollen Calf- R  HPI  Right calf pain and swelling:  He says he went in for a procedure yesterday at [facility] for a cardiac ablation.  Procedure was cancelled due to the swelling to his right calf.  Dr. Quentin Ore prescribed him Bactrim, but he has not started yet.  He says it started swelling about two weeks ago.  He started elevating his leg about a week ago and that it has helped with the swelling. He says the swelling has gotten better. It is warm and tender to the touch.  He is a smoker and has PVD.  He denies any lengthy travel.  Denies any trauma.  He denies any fever or chills. He is currently on Xarelto for afib.    Patient Active Problem List   Diagnosis Date Noted   Persistent atrial fibrillation ([place]) 05/07/2021   Chest pain 05/07/2021   Shortness of breath 05/07/2021   Rapid atrial fibrillation ([place]) 04/19/2021   Diabetes mellitus type 2 with peripheral artery disease ([place]) 03/16/2021   DDD (degenerative disc disease), lumbar 03/06/2019   Primary osteoarthritis involving multiple joints 03/06/2019   Lumbar radiculopathy 11/05/2018   Lumbar post-laminectomy syndrome 11/05/2018   Diabetic polyneuropathy associated with type 2 diabetes mellitus ([place]) 11/05/2018   CLE (columnar lined esophagus)    Gastric irritation    Duodenum ulcer    Personal history of colonic polyps    Polyp of sigmoid colon    Diverticulosis of large intestine without diverticulitis    Benign neoplasm of ascending colon    Osteoarthritis of both hands 09/04/2017   Chronic pain of left knee 09/04/2017   Abnormal MRI, knee 09/04/2017   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   Lymphedema 07/07/2016   Chronic hepatitis C without hepatic coma ([place]) 06/01/2016   Lumbar herniated disc 11/24/2015   Thrombocytopenia ([place]) 11/11/2015   Hepatitis C antibody test positive  07/13/2015   Chronic constipation 07/13/2015   Abnormal immunological findings in specimens from other organs, systems and tissues 07/13/2015   CN (constipation) 07/13/2015   Diabetes mellitus with coincident hypertension ([place]) 03/08/2015   COPD, mild ([place]) 03/08/2015   Dyslipidemia 03/08/2015   Paranoid schizophrenia ([place]) 03/08/2015   Acquired polyneuropathy 03/08/2015   Restless leg 03/08/2015   Tobacco abuse 03/08/2015   Callus of foot 03/08/2015   Claudication ([place]) 03/08/2015   Chronic obstructive pulmonary disease ([place]) 03/08/2015   Corn or callus 03/08/2015   Essential (primary) hypertension 03/08/2015   HLD (hyperlipidemia) 03/08/2015   Peripheral vascular disease ([place]) 03/08/2015   Polyneuropathy 03/08/2015   Current tobacco use 03/08/2015   Vitamin D deficiency 04/26/2009   Avitaminosis D 04/26/2009    Past Surgical History:  Procedure Laterality Date   BACK SURGERY     CARDIOVERSION N/A 05/20/2021   Procedure: CARDIOVERSION;  Surgeon: Wellington Hampshire, MD;  Location: [facility] ORS;  Service: Cardiovascular;  Laterality: N/A;   COLONOSCOPY WITH PROPOFOL N/A 09/07/2017   Procedure: COLONOSCOPY WITH PROPOFOL;  Surgeon: Virgel Manifold, MD;  Location: [facility] END[facility];  Service: Endoscopy;  Laterality: N/A;   ESOPHAGOGASTRODUODENOSCOPY (EGD) WITH PROPOFOL N/A 12/07/2017   Procedure: ESOPHAGOGASTRODUODENOSCOPY (EGD) WITH PROPOFOL;  Surgeon: Virgel Manifold, MD;  Location: [facility];  Service: Endoscopy;  Laterality: N/A;   FLEXIBLE SIGMOIDOSCOPY N/A 11/09/2017   Procedure: FLEXIBLE SIGMOIDOSCOPY;  Surgeon: Virgel Manifold, MD;  Location: [facility];  Service: Endoscopy;  Laterality: N/A;   HERNIA REPAIR     inguinal   INGUINAL HERNIA REPAIR Bilateral 06/21/2018   Procedure: LAPAROSCOPIC BILATERAL INGUINAL HERNIA REPAIR;  Surgeon: Vickie Epley, MD;  Location: [facility];  Service: General;  Laterality: Bilateral;   L3 TO L5 LAMINECTOMY FOR DECOMPRESSION   07/17/2016   [facility]   REPAIR OF CEREBROSPINAL FLUID LEAK N/A 09/08/2016   Procedure: Lumbar wound exploration, repair of pseudomenigocele;  Surgeon: Kevan Ny Ditty, MD;  Location: [place];  Service: Neurosurgery;  Laterality: N/A;  Lumbar wound exploration, repair of pseudomenigocele   SINUSOTOMY      Family History  Problem Relation Age of Onset   Heart failure Mother    Hypertension Mother    Diverticulitis Mother    Colitis Mother    Diabetes Father    Hypertension Father    Heart disease Father    Diabetes Sister    Diverticulitis Brother     Social History   Tobacco Use   Smoking status: Every Day    Packs/day: 1.50    Years: 46.00    Pack years: 69.00    Types: Cigarettes   Smokeless tobacco: Never  Substance Use Topics   Alcohol use: No    Alcohol/week: 0.0 standard drinks     Current Outpatient Medications:    diltiazem (DILACOR XR) 240 MG 24 hr capsule, Take 1 capsule (240 mg total) by mouth daily., Disp: 90 capsule, Rfl: 3   ergocalciferol (VITAMIN D2) 1.25 MG (50000 UT) capsule, Take 50,000 Units by mouth every 14 (fourteen) days., Disp: , Rfl:    Fluticasone-Umeclidin-Vilant (TRELEGY ELLIPTA) 100-62.5-25 MCG/INH AEPB, Take 1 puff by mouth every evening. (Patient taking differently: Take 1 puff by mouth daily as needed (respiratory issues.).), Disp: 60 each, Rfl: 5   gabapentin (NEURONTIN) 300 MG capsule, Take 300-600 mg by mouth See admin instructions. Take 1 capsule (300 mg) by mouth in the morning, take 1 capsule (300 mg) by mouth at noon, and 2 capsules (600 mg) by mouth in the evening, Disp: , Rfl:    insulin detemir (LEVEMIR) 100 UNIT/ML injection, Inject 0.06 mLs (6 Units total) into the skin at bedtime., Disp: 10 mL, Rfl: 0   metoprolol tartrate (LOPRESSOR) 100 MG tablet, Take one tablet by mouth 2 hours prior to cardiac CT, Disp: 1 tablet, Rfl: 0   pravastatin (PRAVACHOL) 20 MG tablet, TAKE 1 TABLET BY MOUTH ONCE DAILY WITH  SUPPER, Disp: 90 tablet, Rfl: 0   sulfamethoxazole-trimethoprim (BACTRIM DS) 800-160 MG tablet, Take 1 tablet by mouth 2 (two) times daily for 7 days., Disp: 14 tablet, Rfl: 0   tiZANidine (ZANAFLEX) 2 MG tablet, Take 2 mg by mouth every 12 (twelve) hours., Disp: , Rfl:    traMADol (ULTRAM) 50 MG tablet, Take 50 mg by mouth every 12 (twelve) hours., Disp: , Rfl:    rivaroxaban (XARELTO) 20 MG TABS tablet, Take 1 tablet (20 mg total) by mouth daily with supper., Disp: 30 tablet, Rfl: 5  Allergies  Allergen Reactions   Penicillins Hives and Swelling    As a child. Has patient had a PCN reaction causing immediate rash, facial/tongue/throat swelling, SOB or lightheadedness with hypotension: YES  Has patient had a PCN reaction causing severe rash involving mucus membranes or skin necrosis: UNKNOWN Has patient had a PCN reaction that required hospitalization: UNKNOWN Has patient had a PCN reaction occurring within the last 10 years: NO   Aspirin Nausea  Only   Ibuprofen Nausea Only   Lyrica [Pregabalin] Nausea Only   Acetaminophen Nausea Only    I personally reviewed active problem list, medication list, allergies, family history, social history, health maintenance with the patient/caregiver today.   ROS  Constitutional: Negative for fever or weight change.  Respiratory: Negative for cough and shortness of breath.   Cardiovascular: Negative for chest pain, positive for palpitations.  Gastrointestinal: Negative for abdominal pain, no bowel changes.  Musculoskeletal: Negative for gait problem or joint swelling.  RLE: positive for swelling and pain Skin: Negative for rash.  Neurological: Negative for dizziness or headache.  No other specific complaints in a complete review of systems (except as listed in HPI above).   Objective  Vitals:   06/10/21 0836  BP: 120/82  Pulse: 85  Resp: 16  Temp: 98 F (36.7 C)  SpO2: 97%  Weight: 166 lb (75.3 kg)  Height: '5\' 11"'  (1.803 m)    Body  mass index is 23.15 kg/m.  Physical Exam  Constitutional: Patient appears well-developed and well-nourished. No distress.  HEENT: head atraumatic, normocephalic, pupils equal and reactive to light,  neck supple Cardiovascular: Normal rate, irregular rhythm and normal heart sounds.  No murmur heard.  Pulmonary/Chest: Effort normal and breath sounds normal. No respiratory distress. Abdominal: Soft.  There is no tenderness. RLE: redness and bruising noted to shin area, minor swelling, approximately 1 inch wider than left noted Psychiatric: Patient has a normal mood and affect. behavior is normal. Judgment and thought content normal.   Recent Results (from the past 2160 hour(s))  POCT HgB A1C     Status: Abnormal   Collection Time: 03/16/21 11:14 AM  Result Value Ref Range   Hemoglobin A1C 7.3 (A) 4.0 - 5.6 %   HbA1c POC (<> result, manual entry)     HbA1c, POC (prediabetic range)     HbA1c, POC (controlled diabetic range)    Basic metabolic panel     Status: Abnormal   Collection Time: 04/19/21 10:04 PM  Result Value Ref Range   Sodium 135 135 - 145 mmol/L   Potassium 4.5 3.5 - 5.1 mmol/L   Chloride 93 (L) 98 - 111 mmol/L   CO2 22 22 - 32 mmol/L   Glucose, Bld 132 (H) 70 - 99 mg/dL    Comment: Glucose reference range applies only to samples taken after fasting for at least 8 hours.   BUN 26 (H) 8 - 23 mg/dL   Creatinine, Ser 1.34 (H) 0.61 - 1.24 mg/dL   Calcium 9.6 8.9 - 10.3 mg/dL   GFR, Estimated 58 (L) >60 mL/min    Comment: (NOTE) Calculated using the CKD-EPI Creatinine Equation (2021)    Anion gap 20 (H) 5 - 15    Comment: Performed at [facility], [address] [place], [place] [address]  CBC     Status: Abnormal   Collection Time: 04/19/21 10:04 PM  Result Value Ref Range   WBC 7.2 4.0 - 10.5 K/uL   RBC 6.20 (H) 4.22 - 5.81 MIL/uL   Hemoglobin 19.5 (H) 13.0 - 17.0 g/dL   HCT 55.2 (H) 39.0 - 52.0 %   MCV 89.0 80.0 - 100.0 fL   MCH 31.5 26.0 - 34.0 pg    MCHC 35.3 30.0 - 36.0 g/dL   RDW 12.6 11.5 - 15.5 %   Platelets 159 150 - 400 K/uL   nRBC 0.0 0.0 - 0.2 %    Comment: Performed at [facility], [address] [place], [place] [address]  Urinalysis, Complete w Microscopic     Status: Abnormal   Collection Time: 04/19/21 10:04 PM  Result Value Ref Range   Color, Urine YELLOW (A) YELLOW   APPearance HAZY (A) CLEAR   Specific Gravity, Urine 1.019 1.005 - 1.030   pH 6.0 5.0 - 8.0   Glucose, UA NEGATIVE NEGATIVE mg/dL   Hgb urine dipstick MODERATE (A) NEGATIVE   Bilirubin Urine NEGATIVE NEGATIVE   Ketones, ur 80 (A) NEGATIVE mg/dL   Protein, ur 30 (A) NEGATIVE mg/dL   Nitrite NEGATIVE NEGATIVE   Leukocytes,Ua NEGATIVE NEGATIVE   RBC / HPF 0-5 0 - 5 RBC/hpf   WBC, UA 0-5 0 - 5 WBC/hpf   Bacteria, UA NONE SEEN NONE SEEN   Squamous Epithelial / LPF 0-5 0 - 5   Mucus PRESENT    Hyaline Casts, UA PRESENT     Comment: Performed at [facility], [place], [place] [address]  Troponin I (High Sensitivity)     Status: None   Collection Time: 04/19/21 10:04 PM  Result Value Ref Range   Troponin I (High Sensitivity) 14 <18 ng/L    Comment: (NOTE) Elevated high sensitivity troponin I (hsTnI) values and significant  changes across serial measurements may suggest ACS but many other  chronic and acute conditions are known to elevate hsTnI results.  Refer to the "Links" section for chest pain algorithms and additional  guidance. Performed at [facility], [address] [place], [place] [address]   Magnesium     Status: None   Collection Time: 04/19/21 10:04 PM  Result Value Ref Range   Magnesium 2.0 1.7 - 2.4 mg/dL    Comment: Performed at [facility], [address] [place], [place] [address]  CK     Status: None   Collection Time: 04/19/21 10:04 PM  Result Value Ref Range   Total CK 175 49 - 397 U/L    Comment: Performed at [facility], [address]  [place], [place] [address]  TSH     Status: None   Collection Time: 04/19/21 10:04 PM  Result Value Ref Range   TSH 1.706 0.350 - 4.500 uIU/mL    Comment: Performed by a 3rd Generation assay with a functional sensitivity of <=0.01 uIU/mL. Performed at [facility], [address] [place], [place] [address]   Urine Drug Screen, Qualitative [facility] only)     Status: Abnormal   Collection Time: 04/19/21 10:04 PM  Result Value Ref Range   Tricyclic, Ur Screen NONE DETECTED NONE DETECTED   Amphetamines, Ur Screen NONE DETECTED NONE DETECTED   MDMA (Ecstasy)Ur Screen NONE DETECTED NONE DETECTED   Cocaine Metabolite,Ur [place] NONE DETECTED NONE DETECTED   Opiate, Ur Screen NONE DETECTED NONE DETECTED   Phencyclidine (PCP) Ur S NONE DETECTED NONE DETECTED   Cannabinoid 50 Ng, Ur [place] POSITIVE (A) NONE DETECTED   Barbiturates, Ur Screen NONE DETECTED NONE DETECTED   Benzodiazepine, Ur Scrn NONE DETECTED NONE DETECTED   Methadone Scn, Ur NONE DETECTED NONE DETECTED    Comment: (NOTE) Tricyclics + metabolites, urine    Cutoff 1000 ng/mL Amphetamines + metabolites, urine  Cutoff 1000 ng/mL MDMA (Ecstasy), urine              Cutoff 500 ng/mL Cocaine Metabolite, urine          Cutoff 300 ng/mL Opiate + metabolites, urine        Cutoff  300 ng/mL Phencyclidine (PCP), urine         Cutoff 25 ng/mL Cannabinoid, urine                 Cutoff 50 ng/mL Barbiturates + metabolites, urine  Cutoff 200 ng/mL Benzodiazepine, urine              Cutoff 200 ng/mL Methadone, urine                   Cutoff 300 ng/mL  The urine drug screen provides only a preliminary, unconfirmed analytical test result and should not be used for non-medical purposes. Clinical consideration and professional judgment should be applied to any positive drug screen result due to possible interfering substances. A more specific alternate chemical method must be used in order to obtain a confirmed analytical result. Gas chromatography /  mass spectrometry (GC/MS) is the preferred confirm atory method. Performed at [facility], [address] [place], [place] [address]   Resp Panel by RT-PCR (Flu A&B, Covid) Nasopharyngeal Swab     Status: Abnormal   Collection Time: 04/19/21 11:26 PM   Specimen: Nasopharyngeal Swab; Nasopharyngeal(NP) swabs in vial transport medium  Result Value Ref Range   SARS Coronavirus 2 by RT PCR POSITIVE (A) NEGATIVE    Comment: RESULT CALLED TO, READ BACK BY AND VERIFIED WITH: RAQUEL DAVID '@[id number]'  ON 04/20/21 SKL (NOTE) SARS-CoV-2 target nucleic acids are DETECTED.  The SARS-CoV-2 RNA is generally detectable in upper respiratory specimens during the acute phase of infection. Positive results are indicative of the presence of the identified virus, but do not rule out bacterial infection or co-infection with other pathogens not detected by the test. Clinical correlation with patient history and other diagnostic information is necessary to determine patient infection status. The expected result is Negative.  Fact Sheet for Patients: EntrepreneurPulse.com.au  Fact Sheet for Healthcare Providers: IncredibleEmployment.be  This test is not yet approved or cleared by the [place] FDA and  has been authorized for detection and/or diagnosis of SARS-CoV-2 by FDA under an Emergency Use Authorization (EUA).  This EUA will remain in effect (meaning this test can be  used) for the duration of  the COVID-19 declaration under Section 564(b)(1) of the Act, 21 U.S.C. section 360bbb-3(b)(1), unless the authorization is terminated or revoked sooner.     Influenza A by PCR NEGATIVE NEGATIVE   Influenza B by PCR NEGATIVE NEGATIVE    Comment: (NOTE) The Xpert Xpress SARS-CoV-2/FLU/RSV plus assay is intended as an aid in the diagnosis of influenza from Nasopharyngeal swab specimens and should not be used as a sole basis for treatment. Nasal washings  and aspirates are unacceptable for Xpert Xpress SARS-CoV-2/FLU/RSV testing.  Fact Sheet for Patients: EntrepreneurPulse.com.au  Fact Sheet for Healthcare Providers: IncredibleEmployment.be  This test is not yet approved or cleared by the [place] FDA and has been authorized for detection and/or diagnosis of SARS-CoV-2 by FDA under an Emergency Use Authorization (EUA). This EUA will remain in effect (meaning this test can be used) for the duration of the COVID-19 declaration under Section 564(b)(1) of the Act, 21 U.S.C. section 360bbb-3(b)(1), unless the authorization is terminated or revoked.  Performed at [facility], [place], [place] [address]   Troponin I (High Sensitivity)     Status: None   Collection Time: 04/20/21  1:31 AM  Result Value Ref Range   Troponin I (High Sensitivity) 11 <18 ng/L    Comment: (NOTE) Elevated high sensitivity troponin  I (hsTnI) values and significant  changes across serial measurements may suggest ACS but many other  chronic and acute conditions are known to elevate hsTnI results.  Refer to the "Links" section for chest pain algorithms and additional  guidance. Performed at [facility], [address] [place], [place] [address]   CBG monitoring, ED     Status: Abnormal   Collection Time: 04/20/21  8:08 AM  Result Value Ref Range   Glucose-Capillary 164 (H) 70 - 99 mg/dL    Comment: Glucose reference range applies only to samples taken after fasting for at least 8 hours.  Protime-INR     Status: None   Collection Time: 04/20/21  9:20 AM  Result Value Ref Range   Prothrombin Time 13.4 11.4 - 15.2 seconds   INR 1.0 0.8 - 1.2    Comment: (NOTE) INR goal varies based on device and disease states. Performed at [facility], [address] [place], [place] [address]   APTT     Status: None   Collection Time: 04/20/21  9:20 AM  Result Value Ref Range    aPTT 28 24 - 36 seconds    Comment: Performed at [facility], [address] [place], [place] [address]  ECHOCARDIOGRAM COMPLETE     Status: None   Collection Time: 04/20/21 10:59 AM  Result Value Ref Range   Weight 2,640 oz   Height 71 in   BP 133/73 mmHg   S' Lateral 2.80 cm  CBG monitoring, ED     Status: Abnormal   Collection Time: 04/20/21 11:56 AM  Result Value Ref Range   Glucose-Capillary 116 (H) 70 - 99 mg/dL    Comment: Glucose reference range applies only to samples taken after fasting for at least 8 hours.  HIV Antibody (routine testing w rflx)     Status: None   Collection Time: 04/20/21  4:02 PM  Result Value Ref Range   HIV Screen 4th Generation wRfx Non Reactive Non Reactive    Comment: Performed at [facility], [place] [address] [place], [place] [address]  Heparin level (unfractionated)     Status: Abnormal   Collection Time: 04/20/21  4:02 PM  Result Value Ref Range   Heparin Unfractionated 0.93 (H) 0.30 - 0.70 IU/mL    Comment: (NOTE) The clinical reportable range upper limit is being lowered to >1.10 to align with the FDA approved guidance for the current laboratory assay.  If heparin results are below expected values, and patient dosage has  been confirmed, suggest follow up testing of antithrombin III levels. Performed at [facility], [address] [place], [place] [address]   Glucose, capillary     Status: Abnormal   Collection Time: 04/20/21  4:06 PM  Result Value Ref Range   Glucose-Capillary 137 (H) 70 - 99 mg/dL    Comment: Glucose reference range applies only to samples taken after fasting for at least 8 hours.  Glucose, capillary     Status: Abnormal   Collection Time: 04/20/21  7:46 PM  Result Value Ref Range   Glucose-Capillary 185 (H) 70 - 99 mg/dL    Comment: Glucose reference range applies only to samples taken after fasting for at least 8 hours.  Heparin level (unfractionated)     Status: None   Collection  Time: 04/21/21 12:35 AM  Result Value Ref Range   Heparin Unfractionated 0.33 0.30 - 0.70 IU/mL    Comment: (NOTE) The clinical reportable range upper  limit is being lowered to >1.10 to align with the FDA approved guidance for the current laboratory assay.  If heparin results are below expected values, and patient dosage has  been confirmed, suggest follow up testing of antithrombin III levels. Performed at [facility], [address] [place], [place] [address]   CBC     Status: Abnormal   Collection Time: 04/21/21 12:35 AM  Result Value Ref Range   WBC 5.2 4.0 - 10.5 K/uL   RBC 5.01 4.22 - 5.81 MIL/uL   Hemoglobin 16.3 13.0 - 17.0 g/dL   HCT 44.5 39.0 - 52.0 %   MCV 88.8 80.0 - 100.0 fL   MCH 32.5 26.0 - 34.0 pg   MCHC 36.6 (H) 30.0 - 36.0 g/dL   RDW 12.3 11.5 - 15.5 %   Platelets 137 (L) 150 - 400 K/uL   nRBC 0.0 0.0 - 0.2 %    Comment: Performed at [facility], [address] [place], [place] [address]  Basic metabolic panel     Status: Abnormal   Collection Time: 04/21/21 12:35 AM  Result Value Ref Range   Sodium 134 (L) 135 - 145 mmol/L   Potassium 3.8 3.5 - 5.1 mmol/L   Chloride 101 98 - 111 mmol/L   CO2 22 22 - 32 mmol/L   Glucose, Bld 142 (H) 70 - 99 mg/dL    Comment: Glucose reference range applies only to samples taken after fasting for at least 8 hours.   BUN 16 8 - 23 mg/dL   Creatinine, Ser 0.70 0.61 - 1.24 mg/dL   Calcium 8.7 (L) 8.9 - 10.3 mg/dL   GFR, Estimated >60 >60 mL/min    Comment: (NOTE) Calculated using the CKD-EPI Creatinine Equation (2021)    Anion gap 11 5 - 15    Comment: Performed at [facility], [place], [place] [address]  Heparin level (unfractionated)     Status: None   Collection Time: 04/21/21  6:57 AM  Result Value Ref Range   Heparin Unfractionated 0.30 0.30 - 0.70 IU/mL    Comment: (NOTE) The clinical reportable range upper limit is being lowered to >1.10 to align with the FDA  approved guidance for the current laboratory assay.  If heparin results are below expected values, and patient dosage has  been confirmed, suggest follow up testing of antithrombin III levels. Performed at [facility], [address] [place], [place] [address]   Glucose, capillary     Status: Abnormal   Collection Time: 04/21/21  8:29 AM  Result Value Ref Range   Glucose-Capillary 114 (H) 70 - 99 mg/dL    Comment: Glucose reference range applies only to samples taken after fasting for at least 8 hours.   Comment 1 Notify RN   Glucose, capillary     Status: Abnormal   Collection Time: 04/21/21 12:38 PM  Result Value Ref Range   Glucose-Capillary 170 (H) 70 - 99 mg/dL    Comment: Glucose reference range applies only to samples taken after fasting for at least 8 hours.  Glucose, capillary     Status: Abnormal   Collection Time: 04/21/21  6:17 PM  Result Value Ref Range   Glucose-Capillary 133 (H) 70 - 99 mg/dL    Comment: Glucose reference range applies only to samples taken after fasting for at least 8 hours.  Glucose, capillary     Status: Abnormal   Collection Time: 04/21/21  8:13 PM  Result Value Ref  Range   Glucose-Capillary 189 (H) 70 - 99 mg/dL    Comment: Glucose reference range applies only to samples taken after fasting for at least 8 hours.  CBC     Status: Abnormal   Collection Time: 04/22/21  4:56 AM  Result Value Ref Range   WBC 5.3 4.0 - 10.5 K/uL   RBC 4.97 4.22 - 5.81 MIL/uL   Hemoglobin 15.9 13.0 - 17.0 g/dL   HCT 44.3 39.0 - 52.0 %   MCV 89.1 80.0 - 100.0 fL   MCH 32.0 26.0 - 34.0 pg   MCHC 35.9 30.0 - 36.0 g/dL   RDW 12.3 11.5 - 15.5 %   Platelets 132 (L) 150 - 400 K/uL   nRBC 0.0 0.0 - 0.2 %    Comment: Performed at [facility], [address] [place], [place] [address]  Glucose, capillary     Status: Abnormal   Collection Time: 04/22/21  9:05 AM  Result Value Ref Range   Glucose-Capillary 143 (H) 70 - 99 mg/dL    Comment:  Glucose reference range applies only to samples taken after fasting for at least 8 hours.  POCT Occult Blood Stool     Status: None   Collection Time: 04/28/21 10:12 AM  Result Value Ref Range   Fecal Occult Blood, POC Negative Negative   Card #1 Date     Card #2 Fecal Occult Blod, POC     Card #2 Date     Card #3 Fecal Occult Blood, POC     Card #3 Date    NM Myocar Multi W/Spect W/Wall Motion / EF     Status: None   Collection Time: 05/12/21 10:32 AM  Result Value Ref Range   ST Depression (mm) 0 mm   Rest Nuclear Isotope Dose 10.9 mCi   Stress Nuclear Isotope Dose 30.6 mCi   SSS 0.0    SRS 13.0    SDS 0.0    TID 1.12    LV sys vol 27.0 mL   LV dias vol 77.0 62 - 150 mL   Nuc Stress EF 65 %  Glucose, capillary     Status: Abnormal   Collection Time: 05/20/21  6:52 AM  Result Value Ref Range   Glucose-Capillary 115 (H) 70 - 99 mg/dL    Comment: Glucose reference range applies only to samples taken after fasting for at least 8 hours.  Basic Metabolic Panel (BMET)     Status: Abnormal   Collection Time: 05/25/21 12:16 PM  Result Value Ref Range   Glucose 143 (H) 70 - 99 mg/dL    Comment:               **Please note reference interval change**   BUN 11 8 - 27 mg/dL   Creatinine, Ser 0.77 0.76 - 1.27 mg/dL   eGFR 99 >59 mL/min/1.73   BUN/Creatinine Ratio 14 10 - 24   Sodium 140 134 - 144 mmol/L   Potassium 4.3 3.5 - 5.2 mmol/L   Chloride 102 96 - 106 mmol/L   CO2 25 20 - 29 mmol/L   Calcium 8.8 8.6 - 10.2 mg/dL  CBC w/Diff     Status: None   Collection Time: 05/25/21 12:16 PM  Result Value Ref Range   WBC 6.7 3.4 - 10.8 x10E3/uL   RBC 4.83 4.14 - 5.80 x10E6/uL   Hemoglobin 15.2 13.0 - 17.7 g/dL   Hematocrit 43.3 37.5 - 51.0 %   MCV 90 79 - 97  fL   MCH 31.5 26.6 - 33.0 pg   MCHC 35.1 31.5 - 35.7 g/dL   RDW 12.2 11.6 - 15.4 %   Platelets 167 150 - 450 x10E3/uL   Neutrophils 63 Not Estab. %   Lymphs 25 Not Estab. %   Monocytes 9 Not Estab. %   Eos 2 Not Estab. %    Basos 1 Not Estab. %   Neutrophils Absolute 4.2 1.4 - 7.0 x10E3/uL   Lymphocytes Absolute 1.7 0.7 - 3.1 x10E3/uL   Monocytes Absolute 0.6 0.1 - 0.9 x10E3/uL   EOS (ABSOLUTE) 0.2 0.0 - 0.4 x10E3/uL   Basophils Absolute 0.0 0.0 - 0.2 x10E3/uL   Immature Granulocytes 0 Not Estab. %   Immature Grans (Abs) 0.0 0.0 - 0.1 x10E3/uL  Glucose, capillary     Status: Abnormal   Collection Time: 06/09/21  8:07 AM  Result Value Ref Range   Glucose-Capillary 119 (H) 70 - 99 mg/dL    Comment: Glucose reference range applies only to samples taken after fasting for at least 8 hours.     PHQ2/9: Depression screen [facility] 2/9 06/10/2021 04/28/2021 03/16/2021 12/02/2020 09/15/2020  Decreased Interest 0 0 0 0 0  Down, Depressed, Hopeless 0 0 0 0 0  PHQ - 2 Score 0 0 0 0 0  Altered sleeping - - - - -  Tired, decreased energy - - - - -  Change in appetite - - - - -  Feeling bad or failure about yourself  - - - - -  Trouble concentrating - - - - -  Moving slowly or fidgety/restless - - - - -  Suicidal thoughts - - - - -  PHQ-9 Score - - - - -  Difficult doing work/chores - - - - -  Some recent data might be hidden    phq 9 is negative   Fall Risk: Fall Risk  06/10/2021 04/28/2021 03/16/2021 12/02/2020 09/15/2020  Falls in the past year? 0 0 0 0 0  Number falls in past yr: 0 0 0 0 0  Injury with Fall? 0 0 0 0 0  Risk for fall due to : No Fall Risks No Fall Risks - No Fall Risks -  Follow up Falls prevention discussed Falls prevention discussed - Falls prevention discussed -      Functional Status Survey: Is the patient deaf or have difficulty hearing?: No Does the patient have difficulty seeing, even when wearing glasses/contacts?: Yes Does the patient have difficulty concentrating, remembering, or making decisions?: No Does the patient have difficulty walking or climbing stairs?: Yes Does the patient have difficulty dressing or bathing?: No Does the patient have difficulty doing errands alone such as  visiting a doctor's office or shopping?: No    Assessment & Plan  1. Right calf pain  - US Venous Img Lower Unilateral Right (DVT); Future   Currently taking antibiotics for cellulitis but we will get doppler to rule out DVT, even though on Xarelto   I,Julie F Pender,acting as a scribe for Loistine Chance, MD.,have documented all relevant documentation on the behalf of Loistine Chance, MD,as directed by  Loistine Chance, MD while in the presence of Loistine Chance, MD.

## 2021-06-10 NOTE — Telephone Encounter (Signed)
Prescription refill request for Eliquis received. Indication: A Fib Last office visit: 05/25/21 Scr: 0.77 Age: 66 Weight: 76 kg  Patient dose is 5mg  BID.  Patient should start first dose of Eliquis when his dose of Xarelto is due. Do not overlap.

## 2021-06-10 NOTE — Telephone Encounter (Signed)
-----   Message from Vickie Epley, MD sent at 06/09/2021 11:43 AM EDT ----- Marykay Lex, need to reschedule his ablation. Had cellulitis on his right leg. At least 1 month out from now to let it heal.  Also he wants to change to eliquis. Can we prescribe this and give him the patient assistance phone number please?  Thanks! [name]

## 2021-06-10 NOTE — Telephone Encounter (Signed)
Pt is calling because he had an ov today pt states that he is having difficulty paying for Fluticasone-Umeclidin-Vilant (TRELEGY ELLIPTA) 100-62.5-25 MCG/INH AEPB [[id number]]  Dr. Ancil Boozer stated that the office may have some samples. Pt forgot to check into before he left.  CB- [phone]

## 2021-06-10 NOTE — Telephone Encounter (Signed)
Outreach made to Pt.  Pt was only interested in changing to Eliquis because he thought it would be cheaper.   Advised Pt that it would probably cost the same as Xarelto.  Gave Pt number to call Xarelto to discuss patient assistance program.  Pt rescheduled for ablation on August 26, 2021.

## 2021-06-10 NOTE — Telephone Encounter (Signed)
Pt aware we do not have samples

## 2021-06-13 ENCOUNTER — Ambulatory Visit: Payer: PPO | Admitting: Family Medicine

## 2021-06-13 DIAGNOSIS — I10 Essential (primary) hypertension: Secondary | ICD-10-CM | POA: Diagnosis not present

## 2021-06-13 DIAGNOSIS — G8929 Other chronic pain: Secondary | ICD-10-CM | POA: Diagnosis not present

## 2021-06-13 DIAGNOSIS — G894 Chronic pain syndrome: Secondary | ICD-10-CM | POA: Diagnosis not present

## 2021-06-13 DIAGNOSIS — M5416 Radiculopathy, lumbar region: Secondary | ICD-10-CM | POA: Diagnosis not present

## 2021-06-13 DIAGNOSIS — Z720 Tobacco use: Secondary | ICD-10-CM | POA: Diagnosis not present

## 2021-06-13 DIAGNOSIS — Z716 Tobacco abuse counseling: Secondary | ICD-10-CM | POA: Diagnosis not present

## 2021-06-13 DIAGNOSIS — E119 Type 2 diabetes mellitus without complications: Secondary | ICD-10-CM | POA: Diagnosis not present

## 2021-06-13 DIAGNOSIS — M5430 Sciatica, unspecified side: Secondary | ICD-10-CM | POA: Diagnosis not present

## 2021-06-13 DIAGNOSIS — F1120 Opioid dependence, uncomplicated: Secondary | ICD-10-CM | POA: Diagnosis not present

## 2021-06-13 DIAGNOSIS — F209 Schizophrenia, unspecified: Secondary | ICD-10-CM | POA: Diagnosis not present

## 2021-06-13 DIAGNOSIS — M961 Postlaminectomy syndrome, not elsewhere classified: Secondary | ICD-10-CM | POA: Diagnosis not present

## 2021-06-13 NOTE — Telephone Encounter (Signed)
**Note De-Identified  Obfuscation** The pt states that [employer] advised him that in 2023 they will not be approving anyone who has ins coverage for medication of any kind for assistance with Xarelto.  He is requesting the phone number to reach [facility] for Eliquis to see if he is eligible for their asst program in 2023 and he is aware to call us back if he decides to switch to Eliquis so we can discuss with Dr Quentin Ore.

## 2021-06-13 NOTE — Telephone Encounter (Signed)
**Note De-Identified  Obfuscation** I called the pt to discuss options to lower his Carlye Grippe cost through [place] and [employer] pt asst program or the [employer] but I got no answer so I left a message on his VM asking him to call Jeani Hawking at [phone] at Dr Claudie Revering office at [facility].

## 2021-06-15 DIAGNOSIS — Z794 Long term (current) use of insulin: Secondary | ICD-10-CM | POA: Diagnosis not present

## 2021-06-15 DIAGNOSIS — D692 Other nonthrombocytopenic purpura: Secondary | ICD-10-CM | POA: Diagnosis not present

## 2021-06-15 DIAGNOSIS — E1142 Type 2 diabetes mellitus with diabetic polyneuropathy: Secondary | ICD-10-CM | POA: Diagnosis not present

## 2021-06-15 DIAGNOSIS — I4819 Other persistent atrial fibrillation: Secondary | ICD-10-CM | POA: Diagnosis not present

## 2021-06-15 DIAGNOSIS — F17200 Nicotine dependence, unspecified, uncomplicated: Secondary | ICD-10-CM | POA: Diagnosis not present

## 2021-06-15 DIAGNOSIS — J449 Chronic obstructive pulmonary disease, unspecified: Secondary | ICD-10-CM | POA: Diagnosis not present

## 2021-06-15 DIAGNOSIS — Z7901 Long term (current) use of anticoagulants: Secondary | ICD-10-CM | POA: Diagnosis not present

## 2021-06-15 DIAGNOSIS — Z91120 Patient's intentional underdosing of medication regimen due to financial hardship: Secondary | ICD-10-CM | POA: Diagnosis not present

## 2021-06-15 DIAGNOSIS — E1151 Type 2 diabetes mellitus with diabetic peripheral angiopathy without gangrene: Secondary | ICD-10-CM | POA: Diagnosis not present

## 2021-06-15 DIAGNOSIS — Z7951 Long term (current) use of inhaled steroids: Secondary | ICD-10-CM | POA: Diagnosis not present

## 2021-06-15 DIAGNOSIS — I878 Other specified disorders of veins: Secondary | ICD-10-CM | POA: Diagnosis not present

## 2021-06-17 ENCOUNTER — Telehealth: Payer: Self-pay | Admitting: *Deleted

## 2021-06-17 ENCOUNTER — Telehealth: Payer: Self-pay | Admitting: Cardiology

## 2021-06-17 DIAGNOSIS — B351 Tinea unguium: Secondary | ICD-10-CM | POA: Diagnosis not present

## 2021-06-17 DIAGNOSIS — E1142 Type 2 diabetes mellitus with diabetic polyneuropathy: Secondary | ICD-10-CM | POA: Diagnosis not present

## 2021-06-17 DIAGNOSIS — I4891 Unspecified atrial fibrillation: Secondary | ICD-10-CM

## 2021-06-17 MED ORDER — RIVAROXABAN 20 MG PO TABS: 20.0000 mg | ORAL_TABLET | Freq: Every day | ORAL | 1 refills | Status: DC

## 2021-06-17 NOTE — Telephone Encounter (Signed)
*  STAT* If patient is at the pharmacy, call can be transferred to refill team.   1. Which medications need to be refilled? (please list name of each medication and dose if known) rivaroxaban (XARELTO) 20 MG TABS tablet  2. Which pharmacy/location (including street and city if local pharmacy) is medication to be sent to? [place] Phone # [phone] Fax # [phone]  3. Do they need a 30 day or 90 day supply? 90 day

## 2021-06-17 NOTE — Telephone Encounter (Signed)
-----   Message from [place], LPN sent at 35/00/9381 10:34 AM EDT ----- Regarding: Xarelto 20 mg Samples Hi, This pt sees Dr End and Dr Quentin Ore at our [place] office. He is in the process of applying for pt asst through [employer] or [employer] for his Xarelto but is going to run out of Xarelto this weekend.  Can you guys give him 2 to 3 weeks of Xarelto 20 mg samples so he does not have to drive to the [place] office to get them? Thanks so much, [employer], LPN

## 2021-06-17 NOTE — Telephone Encounter (Signed)
Error-another encounter is open for this issue.

## 2021-06-17 NOTE — Telephone Encounter (Signed)
Prescription refill request for Xarelto received.  Indication: a fib Last office visit: 05/25/21 Weight: 166 Age: 65  Scr: 0.77 CrCl: 101 mL/min

## 2021-06-17 NOTE — Telephone Encounter (Signed)
Was able to reach Mark Nash to advise have free samples for him waiting at the front office for him to pick-up. [name] very thankful. Has an appt with [facility] at 3 pm, will swing by office to pick samples up.   Xaretlo 20 mg (3 bottles) Lot: [id number] Exp: 09/2023  Bottles up front of office, may pick up today before 5 pm.

## 2021-06-17 NOTE — Telephone Encounter (Signed)
**Note De-Identified  Obfuscation** This pt sees Dr End and Dr Quentin Ore at our [place] office. He is in the process of applying for pt asst through [employer] or [employer] for his Xarelto but is going to run out of Xarelto this weekend.  I will forward this message to our [facility] Triage team so they can provide him 2 to 3 weeks of Xarelto 20 mg samples so he does not have to drive to the [place] office to get them.

## 2021-06-28 ENCOUNTER — Telehealth: Payer: Self-pay

## 2021-06-28 ENCOUNTER — Ambulatory Visit (INDEPENDENT_AMBULATORY_CARE_PROVIDER_SITE_OTHER): Payer: PPO

## 2021-06-28 ENCOUNTER — Other Ambulatory Visit: Payer: Self-pay

## 2021-06-28 DIAGNOSIS — I482 Chronic atrial fibrillation, unspecified: Secondary | ICD-10-CM

## 2021-06-28 DIAGNOSIS — I4891 Unspecified atrial fibrillation: Secondary | ICD-10-CM | POA: Diagnosis not present

## 2021-06-28 LAB — ECHOCARDIOGRAM COMPLETE
AR max vel: 2.02 cm2
AV Area VTI: 1.83 cm2
AV Area mean vel: 1.69 cm2
AV Mean grad: 2 mmHg
AV Peak grad: 3.6 mmHg
Ao pk vel: 0.94 m/s
Area-P 1/2: 4.65 cm2
S' Lateral: 2.8 cm

## 2021-06-28 NOTE — Telephone Encounter (Signed)
Copied from [place] [phone]. Topic: General - Other >> Jun 28, 2021 10:24 AM Mark Nash wrote: Reason for CRM: The patient has called to share that they have taken all of their antibiotics to treat the cellulitis in their right leg and continue to experience some discomfort  The patient declined to schedule an appointment at the time of call but would like to know if they'll be prescribed more antibiotics to help with the discomfort   The patient has an appt previously scheduled for 07/13/21 but would like to know if their PCP would like to see them sooner  Please contact further when possible

## 2021-06-29 ENCOUNTER — Ambulatory Visit (INDEPENDENT_AMBULATORY_CARE_PROVIDER_SITE_OTHER): Payer: PPO | Admitting: Family Medicine

## 2021-06-29 ENCOUNTER — Encounter: Payer: Self-pay | Admitting: Family Medicine

## 2021-06-29 VITALS — BP 122/76 | HR 63 | Temp 98.1°F | Resp 16 | Ht 71.0 in | Wt 170.0 lb

## 2021-06-29 DIAGNOSIS — L03115 Cellulitis of right lower limb: Secondary | ICD-10-CM | POA: Diagnosis not present

## 2021-06-29 DIAGNOSIS — J449 Chronic obstructive pulmonary disease, unspecified: Secondary | ICD-10-CM

## 2021-06-29 MED ORDER — DOXYCYCLINE HYCLATE 100 MG PO TABS: 100.0000 mg | ORAL_TABLET | Freq: Two times a day (BID) | ORAL | 0 refills | Status: DC

## 2021-06-29 MED ORDER — LEVOFLOXACIN 500 MG PO TABS
500.0000 mg | ORAL_TABLET | Freq: Every day | ORAL | 0 refills | Status: AC
Start: 2021-06-29 — End: 2021-07-06

## 2021-06-29 NOTE — Progress Notes (Signed)
Name: RONNALD SHEDDEN   MRN: [id number]    DOB: 1956/01/11   Date:06/29/2021       Progress Note  Subjective  Chief Complaint  Cellulitis  HPI  Right calf pain and swelling: he was treated for cellulitis 10/14, the redness and swelling has improved but has not resolved, he has a sore on right medial ankle but that has healed now. He has PVD . He is going to have an ablation for Afib in December and procedure will be postponed again if he has any infections. He denies fever or chills. He has been elevating his leg but states calf is still tight. During last episode of cellulitis doppler US negative for clots.   COPD: he states Trelegy is too expensive now, his insurance sent him papers to fill out for assistance but he is feeling overwhelmed doing it alone. He asked about [facility] , we will place a new referral today. He has cough and SOB with moderate activity, I gave him two weeks of Trelegy samples today    Patient Active Problem List   Diagnosis Date Noted   Persistent atrial fibrillation ([place]) 05/07/2021   Chest pain 05/07/2021   Shortness of breath 05/07/2021   Rapid atrial fibrillation ([place]) 04/19/2021   Diabetes mellitus type 2 with peripheral artery disease ([place]) 03/16/2021   DDD (degenerative disc disease), lumbar 03/06/2019   Primary osteoarthritis involving multiple joints 03/06/2019   Lumbar radiculopathy 11/05/2018   Lumbar post-laminectomy syndrome 11/05/2018   Diabetic polyneuropathy associated with type 2 diabetes mellitus ([place]) 11/05/2018   CLE (columnar lined esophagus)    Gastric irritation    Duodenum ulcer    Personal history of colonic polyps    Polyp of sigmoid colon    Diverticulosis of large intestine without diverticulitis    Benign neoplasm of ascending colon    Osteoarthritis of both hands 09/04/2017   Chronic pain of left knee 09/04/2017   Abnormal MRI, knee 09/04/2017   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   Lymphedema 07/07/2016   Chronic  hepatitis C without hepatic coma ([place]) 06/01/2016   Lumbar herniated disc 11/24/2015   Thrombocytopenia ([place]) 11/11/2015   Hepatitis C antibody test positive 07/13/2015   Chronic constipation 07/13/2015   Abnormal immunological findings in specimens from other organs, systems and tissues 07/13/2015   CN (constipation) 07/13/2015   Diabetes mellitus with coincident hypertension ([place]) 03/08/2015   COPD, mild ([place]) 03/08/2015   Dyslipidemia 03/08/2015   Paranoid schizophrenia ([place]) 03/08/2015   Acquired polyneuropathy 03/08/2015   Restless leg 03/08/2015   Tobacco abuse 03/08/2015   Callus of foot 03/08/2015   Claudication ([place]) 03/08/2015   Chronic obstructive pulmonary disease ([place]) 03/08/2015   Corn or callus 03/08/2015   Essential (primary) hypertension 03/08/2015   HLD (hyperlipidemia) 03/08/2015   Peripheral vascular disease ([employer]) 03/08/2015   Polyneuropathy 03/08/2015   Current tobacco use 03/08/2015   Vitamin D deficiency 04/26/2009   Avitaminosis D 04/26/2009    Past Surgical History:  Procedure Laterality Date   BACK SURGERY     CARDIOVERSION N/A 05/20/2021   Procedure: CARDIOVERSION;  Surgeon: Wellington Hampshire, MD;  Location: [facility];  Service: Cardiovascular;  Laterality: N/A;   COLONOSCOPY WITH PROPOFOL N/A 09/07/2017   Procedure: COLONOSCOPY WITH PROPOFOL;  Surgeon: Virgel Manifold, MD;  Location: [facility];  Service: Endoscopy;  Laterality: N/A;   ESOPHAGOGASTRODUODENOSCOPY (EGD) WITH PROPOFOL N/A 12/07/2017   Procedure: ESOPHAGOGASTRODUODENOSCOPY (EGD) WITH PROPOFOL;  Surgeon: Virgel Manifold, MD;  Location: [facility];  Service:  Endoscopy;  Laterality: N/A;   FLEXIBLE SIGMOIDOSCOPY N/A 11/09/2017   Procedure: FLEXIBLE SIGMOIDOSCOPY;  Surgeon: Virgel Manifold, MD;  Location: [facility];  Service: Endoscopy;  Laterality: N/A;   HERNIA REPAIR     inguinal   INGUINAL HERNIA REPAIR Bilateral 06/21/2018   Procedure: LAPAROSCOPIC BILATERAL  INGUINAL HERNIA REPAIR;  Surgeon: Vickie Epley, MD;  Location: [facility];  Service: General;  Laterality: Bilateral;   L3 TO L5 LAMINECTOMY FOR DECOMPRESSION  07/17/2016   [facility]   REPAIR OF CEREBROSPINAL FLUID LEAK N/A 09/08/2016   Procedure: Lumbar wound exploration, repair of pseudomenigocele;  Surgeon: Kevan Ny Ditty, MD;  Location: [facility];  Service: Neurosurgery;  Laterality: N/A;  Lumbar wound exploration, repair of pseudomenigocele   SINUSOTOMY      Family History  Problem Relation Age of Onset   Heart failure Mother    Hypertension Mother    Diverticulitis Mother    Colitis Mother    Diabetes Father    Hypertension Father    Heart disease Father    Diabetes Sister    Diverticulitis Brother     Social History   Tobacco Use   Smoking status: Every Day    Packs/day: 1.50    Years: 46.00    Pack years: 69.00    Types: Cigarettes   Smokeless tobacco: Never  Substance Use Topics   Alcohol use: No    Alcohol/week: 0.0 standard drinks     Current Outpatient Medications:    diltiazem (DILACOR XR) 240 MG 24 hr capsule, Take 1 capsule (240 mg total) by mouth daily., Disp: 90 capsule, Rfl: 3   doxycycline (VIBRA-TABS) 100 MG tablet, Take 1 tablet (100 mg total) by mouth 2 (two) times daily., Disp: 14 tablet, Rfl: 0   ergocalciferol (VITAMIN D2) 1.25 MG (50000 UT) capsule, Take 50,000 Units by mouth every 14 (fourteen) days., Disp: , Rfl:    Fluticasone-Umeclidin-Vilant (TRELEGY ELLIPTA) 100-62.5-25 MCG/INH AEPB, Take 1 puff by mouth every evening. (Patient taking differently: Take 1 puff by mouth daily as needed (respiratory issues.).), Disp: 60 each, Rfl: 5   gabapentin (NEURONTIN) 300 MG capsule, Take 300-600 mg by mouth See admin instructions. Take 1 capsule (300 mg) by mouth in the morning, take 1 capsule (300 mg) by mouth at noon, and 2 capsules (600 mg) by mouth in the evening, Disp: , Rfl:    insulin detemir (LEVEMIR) 100 UNIT/ML  injection, Inject 0.06 mLs (6 Units total) into the skin at bedtime., Disp: 10 mL, Rfl: 0   levofloxacin (LEVAQUIN) 500 MG tablet, Take 1 tablet (500 mg total) by mouth daily for 7 days., Disp: 7 tablet, Rfl: 0   metoprolol tartrate (LOPRESSOR) 100 MG tablet, Take one tablet by mouth 2 hours prior to cardiac CT, Disp: 1 tablet, Rfl: 0   pravastatin (PRAVACHOL) 20 MG tablet, TAKE 1 TABLET BY MOUTH ONCE DAILY WITH SUPPER, Disp: 90 tablet, Rfl: 0   rivaroxaban (XARELTO) 20 MG TABS tablet, Take 1 tablet (20 mg total) by mouth daily with supper., Disp: 90 tablet, Rfl: 1   rivaroxaban (XARELTO) 20 MG TABS tablet, Take by mouth., Disp: , Rfl:    tiZANidine (ZANAFLEX) 2 MG tablet, Take 2 mg by mouth every 12 (twelve) hours., Disp: , Rfl:    traMADol (ULTRAM) 50 MG tablet, Take 50 mg by mouth every 12 (twelve) hours., Disp: , Rfl:   Allergies  Allergen Reactions   Penicillins Hives and Swelling    As a child. Has  patient had a PCN reaction causing immediate rash, facial/tongue/throat swelling, SOB or lightheadedness with hypotension: YES  Has patient had a PCN reaction causing severe rash involving mucus membranes or skin necrosis: UNKNOWN Has patient had a PCN reaction that required hospitalization: UNKNOWN Has patient had a PCN reaction occurring within the last 10 years: NO   Aspirin Nausea Only   Ibuprofen Nausea Only   Lyrica [Pregabalin] Nausea Only   Acetaminophen Nausea Only    I personally reviewed active problem list, medication list, allergies, family history with the patient/caregiver today.   ROS  Ten systems reviewed and is negative except as mentioned in HPI   Objective  Vitals:   06/29/21 1321  BP: 122/76  Pulse: 63  Resp: 16  Temp: 98.1 F (36.7 C)  SpO2: 97%  Weight: 170 lb (77.1 kg)  Height: $Remove'5\' 11"'mWSCCKq$  (1.803 m)    Body mass index is 23.71 kg/m.  Physical Exam  Constitutional: Patient appears well-developed and well-nourished. No distress.  HEENT: head  atraumatic, normocephalic, pupils equal and reactive to light, neck supple, Cardiovascular: Normal rate, regular rhythm and normal heart sounds.  No murmur heard. 1 plus right lower leg edema, feels tight, wound on ankle has healed. Both lower legs with hyperpigmentation and mild erythema  Pulmonary/Chest: Effort normal and breath sounds normal. No respiratory distress. Abdominal: Soft.  There is no tenderness. Psychiatric: Patient has a normal mood and affect. behavior is normal. Judgment and thought content normal.   Recent Results (from the past 2160 hour(s))  Basic metabolic panel     Status: Abnormal   Collection Time: 04/19/21 10:04 PM  Result Value Ref Range   Sodium 135 135 - 145 mmol/L   Potassium 4.5 3.5 - 5.1 mmol/L   Chloride 93 (L) 98 - 111 mmol/L   CO2 22 22 - 32 mmol/L   Glucose, Bld 132 (H) 70 - 99 mg/dL    Comment: Glucose reference range applies only to samples taken after fasting for at least 8 hours.   BUN 26 (H) 8 - 23 mg/dL   Creatinine, Ser 1.34 (H) 0.61 - 1.24 mg/dL   Calcium 9.6 8.9 - 10.3 mg/dL   GFR, Estimated 58 (L) >60 mL/min    Comment: (NOTE) Calculated using the CKD-EPI Creatinine Equation (2021)    Anion gap 20 (H) 5 - 15    Comment: Performed at [facility], [address] [place], [place] [address]  CBC     Status: Abnormal   Collection Time: 04/19/21 10:04 PM  Result Value Ref Range   WBC 7.2 4.0 - 10.5 K/uL   RBC 6.20 (H) 4.22 - 5.81 MIL/uL   Hemoglobin 19.5 (H) 13.0 - 17.0 g/dL   HCT 55.2 (H) 39.0 - 52.0 %   MCV 89.0 80.0 - 100.0 fL   MCH 31.5 26.0 - 34.0 pg   MCHC 35.3 30.0 - 36.0 g/dL   RDW 12.6 11.5 - 15.5 %   Platelets 159 150 - 400 K/uL   nRBC 0.0 0.0 - 0.2 %    Comment: Performed at [facility], [address] [place], [place] [address]  Urinalysis, Complete w Microscopic     Status: Abnormal   Collection Time: 04/19/21 10:04 PM  Result Value Ref Range   Color, Urine YELLOW (A) YELLOW   APPearance HAZY (A)  CLEAR   Specific Gravity, Urine 1.019 1.005 - 1.030   pH 6.0 5.0 - 8.0   Glucose, UA NEGATIVE NEGATIVE mg/dL   Hgb  urine dipstick MODERATE (A) NEGATIVE   Bilirubin Urine NEGATIVE NEGATIVE   Ketones, ur 80 (A) NEGATIVE mg/dL   Protein, ur 30 (A) NEGATIVE mg/dL   Nitrite NEGATIVE NEGATIVE   Leukocytes,Ua NEGATIVE NEGATIVE   RBC / HPF 0-5 0 - 5 RBC/hpf   WBC, UA 0-5 0 - 5 WBC/hpf   Bacteria, UA NONE SEEN NONE SEEN   Squamous Epithelial / LPF 0-5 0 - 5   Mucus PRESENT    Hyaline Casts, UA PRESENT     Comment: Performed at [facility], [place], [place] [address]  Troponin I (High Sensitivity)     Status: None   Collection Time: 04/19/21 10:04 PM  Result Value Ref Range   Troponin I (High Sensitivity) 14 <18 ng/L    Comment: (NOTE) Elevated high sensitivity troponin I (hsTnI) values and significant  changes across serial measurements may suggest ACS but many other  chronic and acute conditions are known to elevate hsTnI results.  Refer to the "Links" section for chest pain algorithms and additional  guidance. Performed at [facility], [address] [place], [place] [address]   Magnesium     Status: None   Collection Time: 04/19/21 10:04 PM  Result Value Ref Range   Magnesium 2.0 1.7 - 2.4 mg/dL    Comment: Performed at [facility], [address] [place], [place] [address]  CK     Status: None   Collection Time: 04/19/21 10:04 PM  Result Value Ref Range   Total CK 175 49 - 397 U/L    Comment: Performed at [facility], [address] [place], [place] [address]  TSH     Status: None   Collection Time: 04/19/21 10:04 PM  Result Value Ref Range   TSH 1.706 0.350 - 4.500 uIU/mL    Comment: Performed by a 3rd Generation assay with a functional sensitivity of <=0.01 uIU/mL. Performed at [facility], [address], [place], [place] [address]   Urine Drug Screen, Qualitative [facility] only)     Status: Abnormal    Collection Time: 04/19/21 10:04 PM  Result Value Ref Range   Tricyclic, Ur Screen NONE DETECTED NONE DETECTED   Amphetamines, Ur Screen NONE DETECTED NONE DETECTED   MDMA (Ecstasy)Ur Screen NONE DETECTED NONE DETECTED   Cocaine Metabolite,Ur [place] NONE DETECTED NONE DETECTED   Opiate, Ur Screen NONE DETECTED NONE DETECTED   Phencyclidine (PCP) Ur S NONE DETECTED NONE DETECTED   Cannabinoid 50 Ng, Ur [place] POSITIVE (A) NONE DETECTED   Barbiturates, Ur Screen NONE DETECTED NONE DETECTED   Benzodiazepine, Ur Scrn NONE DETECTED NONE DETECTED   Methadone Scn, Ur NONE DETECTED NONE DETECTED    Comment: (NOTE) Tricyclics + metabolites, urine    Cutoff 1000 ng/mL Amphetamines + metabolites, urine  Cutoff 1000 ng/mL MDMA (Ecstasy), urine              Cutoff 500 ng/mL Cocaine Metabolite, urine          Cutoff 300 ng/mL Opiate + metabolites, urine        Cutoff 300 ng/mL Phencyclidine (PCP), urine         Cutoff 25 ng/mL Cannabinoid, urine                 Cutoff 50 ng/mL Barbiturates + metabolites, urine  Cutoff 200 ng/mL Benzodiazepine, urine              Cutoff 200 ng/mL Methadone, urine  Cutoff 300 ng/mL  The urine drug screen provides only a preliminary, unconfirmed analytical test result and should not be used for non-medical purposes. Clinical consideration and professional judgment should be applied to any positive drug screen result due to possible interfering substances. A more specific alternate chemical method must be used in order to obtain a confirmed analytical result. Gas chromatography / mass spectrometry (GC/MS) is the preferred confirm atory method. Performed at [facility], [address] [place], [place] [address]   Resp Panel by RT-PCR (Flu A&B, Covid) Nasopharyngeal Swab     Status: Abnormal   Collection Time: 04/19/21 11:26 PM   Specimen: Nasopharyngeal Swab; Nasopharyngeal(NP) swabs in vial transport medium  Result Value Ref Range   SARS  Coronavirus 2 by RT PCR POSITIVE (A) NEGATIVE    Comment: RESULT CALLED TO, READ BACK BY AND VERIFIED WITH: RAQUEL DAVID _0  ON 04/20/21 SK[facility] (NOTE) SARS-CoV-2 target nucleic acids are DETECTED.  The SARS-CoV-2 RNA is generally detectable in upper respiratory specimens during the acute phase of infection. Positive results are indicative of the presence of the identified virus, but do not rule out bacterial infection or co-infection with other pathogens not detected by the test. Clinical correlation with patient history and other diagnostic information is necessary to determine patient infection status. The expected result is Negative.  Fact Sheet for Patients: EntrepreneurPulse.com.au  Fact Sheet for Healthcare Providers: IncredibleEmployment.be  This test is not yet approved or cleared by the [place] FDA and  has been authorized for detection and/or diagnosis of SARS-CoV-2 by FDA under an Emergency Use Authorization (EUA).  This EUA will remain in effect (meaning this test can be  used) for the duration of  the COVID-19 declaration under Section 564(b)(1) of the Act, 21 U.S.C. section 360bbb-3(b)(1), unless the authorization is terminated or revoked sooner.     Influenza A by PCR NEGATIVE NEGATIVE   Influenza B by PCR NEGATIVE NEGATIVE    Comment: (NOTE) The Xpert Xpress SARS-CoV-2/FLU/RSV plus assay is intended as an aid in the diagnosis of influenza from Nasopharyngeal swab specimens and should not be used as a sole basis for treatment. Nasal washings and aspirates are unacceptable for Xpert Xpress SARS-CoV-2/FLU/RSV testing.  Fact Sheet for Patients: EntrepreneurPulse.com.au  Fact Sheet for Healthcare Providers: IncredibleEmployment.be  This test is not yet approved or cleared by the [place] FDA and has been authorized for detection and/or diagnosis of SARS-CoV-2 by FDA under an  Emergency Use Authorization (EUA). This EUA will remain in effect (meaning this test can be used) for the duration of the COVID-19 declaration under Section 564(b)(1) of the Act, 21 U.S.C. section 360bbb-3(b)(1), unless the authorization is terminated or revoked.  Performed at [facility], [place], [place] [address]   Troponin I (High Sensitivity)     Status: None   Collection Time: 04/20/21  1:31 AM  Result Value Ref Range   Troponin I (High Sensitivity) 11 <18 ng/L    Comment: (NOTE) Elevated high sensitivity troponin I (hsTnI) values and significant  changes across serial measurements may suggest ACS but many other  chronic and acute conditions are known to elevate hsTnI results.  Refer to the "Links" section for chest pain algorithms and additional  guidance. Performed at [facility], [address] [place], [place] [address]   CBG monitoring, ED     Status: Abnormal   Collection Time: 04/20/21  8:08 AM  Result Value Ref Range   Glucose-Capillary 164 (H) 70 -  99 mg/dL    Comment: Glucose reference range applies only to samples taken after fasting for at least 8 hours.  Protime-INR     Status: None   Collection Time: 04/20/21  9:20 AM  Result Value Ref Range   Prothrombin Time 13.4 11.4 - 15.2 seconds   INR 1.0 0.8 - 1.2    Comment: (NOTE) INR goal varies based on device and disease states. Performed at [facility], [address] [place], [place] [address]   APTT     Status: None   Collection Time: 04/20/21  9:20 AM  Result Value Ref Range   aPTT 28 24 - 36 seconds    Comment: Performed at [facility], [address] [place], [place] [address]  ECHOCARDIOGRAM COMPLETE     Status: None   Collection Time: 04/20/21 10:59 AM  Result Value Ref Range   Weight 2,640 oz   Height 71 in   BP 133/73 mmHg   S' Lateral 2.80 cm  CBG monitoring, ED     Status: Abnormal   Collection Time: 04/20/21 11:56 AM  Result  Value Ref Range   Glucose-Capillary 116 (H) 70 - 99 mg/dL    Comment: Glucose reference range applies only to samples taken after fasting for at least 8 hours.  HIV Antibody (routine testing w rflx)     Status: None   Collection Time: 04/20/21  4:02 PM  Result Value Ref Range   HIV Screen 4th Generation wRfx Non Reactive Non Reactive    Comment: Performed at [facility], [address] [address], [place], [place] [address]  Heparin level (unfractionated)     Status: Abnormal   Collection Time: 04/20/21  4:02 PM  Result Value Ref Range   Heparin Unfractionated 0.93 (H) 0.30 - 0.70 IU/mL    Comment: (NOTE) The clinical reportable range upper limit is being lowered to >1.10 to align with the FDA approved guidance for the current laboratory assay.  If heparin results are below expected values, and patient dosage has  been confirmed, suggest follow up testing of antithrombin III levels. Performed at [facility], [address] [place], [place] [address]   Glucose, capillary     Status: Abnormal   Collection Time: 04/20/21  4:06 PM  Result Value Ref Range   Glucose-Capillary 137 (H) 70 - 99 mg/dL    Comment: Glucose reference range applies only to samples taken after fasting for at least 8 hours.  Glucose, capillary     Status: Abnormal   Collection Time: 04/20/21  7:46 PM  Result Value Ref Range   Glucose-Capillary 185 (H) 70 - 99 mg/dL    Comment: Glucose reference range applies only to samples taken after fasting for at least 8 hours.  Heparin level (unfractionated)     Status: None   Collection Time: 04/21/21 12:35 AM  Result Value Ref Range   Heparin Unfractionated 0.33 0.30 - 0.70 IU/mL    Comment: (NOTE) The clinical reportable range upper limit is being lowered to >1.10 to align with the FDA approved guidance for the current laboratory assay.  If heparin results are below expected values, and patient dosage has  been confirmed, suggest follow up testing of  antithrombin III levels. Performed at [facility], [address] [place], [place] [address]   CBC     Status: Abnormal   Collection Time: 04/21/21 12:35 AM  Result Value Ref Range   WBC 5.2 4.0 - 10.5 K/uL  RBC 5.01 4.22 - 5.81 MIL/uL   Hemoglobin 16.3 13.0 - 17.0 g/dL   HCT 44.5 39.0 - 52.0 %   MCV 88.8 80.0 - 100.0 fL   MCH 32.5 26.0 - 34.0 pg   MCHC 36.6 (H) 30.0 - 36.0 g/dL   RDW 12.3 11.5 - 15.5 %   Platelets 137 (L) 150 - 400 K/uL   nRBC 0.0 0.0 - 0.2 %    Comment: Performed at [facility], [address] [place], [place] [address]  Basic metabolic panel     Status: Abnormal   Collection Time: 04/21/21 12:35 AM  Result Value Ref Range   Sodium 134 (L) 135 - 145 mmol/L   Potassium 3.8 3.5 - 5.1 mmol/L   Chloride 101 98 - 111 mmol/L   CO2 22 22 - 32 mmol/L   Glucose, Bld 142 (H) 70 - 99 mg/dL    Comment: Glucose reference range applies only to samples taken after fasting for at least 8 hours.   BUN 16 8 - 23 mg/dL   Creatinine, Ser 0.70 0.61 - 1.24 mg/dL   Calcium 8.7 (L) 8.9 - 10.3 mg/dL   GFR, Estimated >60 >60 mL/min    Comment: (NOTE) Calculated using the CKD-EPI Creatinine Equation (2021)    Anion gap 11 5 - 15    Comment: Performed at [facility], [place], [place] [address]  Heparin level (unfractionated)     Status: None   Collection Time: 04/21/21  6:57 AM  Result Value Ref Range   Heparin Unfractionated 0.30 0.30 - 0.70 IU/mL    Comment: (NOTE) The clinical reportable range upper limit is being lowered to >1.10 to align with the FDA approved guidance for the current laboratory assay.  If heparin results are below expected values, and patient dosage has  been confirmed, suggest follow up testing of antithrombin III levels. Performed at [facility], [address] [place], [place] [address]   Glucose, capillary     Status: Abnormal   Collection Time: 04/21/21  8:29 AM  Result Value Ref  Range   Glucose-Capillary 114 (H) 70 - 99 mg/dL    Comment: Glucose reference range applies only to samples taken after fasting for at least 8 hours.   Comment 1 Notify RN   Glucose, capillary     Status: Abnormal   Collection Time: 04/21/21 12:38 PM  Result Value Ref Range   Glucose-Capillary 170 (H) 70 - 99 mg/dL    Comment: Glucose reference range applies only to samples taken after fasting for at least 8 hours.  Glucose, capillary     Status: Abnormal   Collection Time: 04/21/21  6:17 PM  Result Value Ref Range   Glucose-Capillary 133 (H) 70 - 99 mg/dL    Comment: Glucose reference range applies only to samples taken after fasting for at least 8 hours.  Glucose, capillary     Status: Abnormal   Collection Time: 04/21/21  8:13 PM  Result Value Ref Range   Glucose-Capillary 189 (H) 70 - 99 mg/dL    Comment: Glucose reference range applies only to samples taken after fasting for at least 8 hours.  CBC     Status: Abnormal   Collection Time: 04/22/21  4:56 AM  Result Value Ref Range   WBC 5.3 4.0 - 10.5 K/uL   RBC 4.97 4.22 - 5.81 MIL/uL   Hemoglobin 15.9 13.0 - 17.0 g/dL   HCT 44.3 39.0 - 52.0 %  MCV 89.1 80.0 - 100.0 fL   MCH 32.0 26.0 - 34.0 pg   MCHC 35.9 30.0 - 36.0 g/dL   RDW 12.3 11.5 - 15.5 %   Platelets 132 (L) 150 - 400 K/uL   nRBC 0.0 0.0 - 0.2 %    Comment: Performed at [facility], [address] [place], [place] [address]  Glucose, capillary     Status: Abnormal   Collection Time: 04/22/21  9:05 AM  Result Value Ref Range   Glucose-Capillary 143 (H) 70 - 99 mg/dL    Comment: Glucose reference range applies only to samples taken after fasting for at least 8 hours.  POCT Occult Blood Stool     Status: None   Collection Time: 04/28/21 10:12 AM  Result Value Ref Range   Fecal Occult Blood, POC Negative Negative   Card #1 Date     Card #2 Fecal Occult Blod, POC     Card #2 Date     Card #3 Fecal Occult Blood, POC     Card #3 Date    NM Myocar Multi  W/Spect W/Wall Motion / EF     Status: None   Collection Time: 05/12/21 10:32 AM  Result Value Ref Range   ST Depression (mm) 0 mm   Rest Nuclear Isotope Dose 10.9 mCi   Stress Nuclear Isotope Dose 30.6 mCi   SSS 0.0    SRS 13.0    SDS 0.0    TID 1.12    LV sys vol 27.0 mL   LV dias vol 77.0 62 - 150 mL   Nuc Stress EF 65 %  Glucose, capillary     Status: Abnormal   Collection Time: 05/20/21  6:52 AM  Result Value Ref Range   Glucose-Capillary 115 (H) 70 - 99 mg/dL    Comment: Glucose reference range applies only to samples taken after fasting for at least 8 hours.  Basic Metabolic Panel (BMET)     Status: Abnormal   Collection Time: 05/25/21 12:16 PM  Result Value Ref Range   Glucose 143 (H) 70 - 99 mg/dL    Comment:               **Please note reference interval change**   BUN 11 8 - 27 mg/dL   Creatinine, Ser 0.77 0.76 - 1.27 mg/dL   eGFR 99 >59 mL/min/1.73   BUN/Creatinine Ratio 14 10 - 24   Sodium 140 134 - 144 mmol/L   Potassium 4.3 3.5 - 5.2 mmol/L   Chloride 102 96 - 106 mmol/L   CO2 25 20 - 29 mmol/L   Calcium 8.8 8.6 - 10.2 mg/dL  CBC w/Diff     Status: None   Collection Time: 05/25/21 12:16 PM  Result Value Ref Range   WBC 6.7 3.4 - 10.8 x10E3/uL   RBC 4.83 4.14 - 5.80 x10E6/uL   Hemoglobin 15.2 13.0 - 17.7 g/dL   Hematocrit 43.3 37.5 - 51.0 %   MCV 90 79 - 97 fL   MCH 31.5 26.6 - 33.0 pg   MCHC 35.1 31.5 - 35.7 g/dL   RDW 12.2 11.6 - 15.4 %   Platelets 167 150 - 450 x10E3/uL   Neutrophils 63 Not Estab. %   Lymphs 25 Not Estab. %   Monocytes 9 Not Estab. %   Eos 2 Not Estab. %   Basos 1 Not Estab. %   Neutrophils Absolute 4.2 1.4 - 7.0 x10E3/uL   Lymphocytes Absolute 1.7 0.7 -  3.1 x10E3/uL   Monocytes Absolute 0.6 0.1 - 0.9 x10E3/uL   EOS (ABSOLUTE) 0.2 0.0 - 0.4 x10E3/uL   Basophils Absolute 0.0 0.0 - 0.2 x10E3/uL   Immature Granulocytes 0 Not Estab. %   Immature Grans (Abs) 0.0 0.0 - 0.1 x10E3/uL  Glucose, capillary     Status: Abnormal    Collection Time: 06/09/21  8:07 AM  Result Value Ref Range   Glucose-Capillary 119 (H) 70 - 99 mg/dL    Comment: Glucose reference range applies only to samples taken after fasting for at least 8 hours.  ECHOCARDIOGRAM COMPLETE     Status: None   Collection Time: 06/28/21  3:48 PM  Result Value Ref Range   AR max vel 2.02 cm2   AV Peak grad 3.6 mmHg   Ao pk vel 0.94 m/s   S' Lateral 2.80 cm   Area-P 1/2 4.65 cm2   AV Area VTI 1.83 cm2   AV Mean grad 2.0 mmHg   AV Area mean vel 1.69 cm2     PHQ2/9: Depression screen [facility] 2/9 06/29/2021 06/10/2021 04/28/2021 03/16/2021 12/02/2020  Decreased Interest 0 0 0 0 0  Down, Depressed, Hopeless 0 0 0 0 0  PHQ - 2 Score 0 0 0 0 0  Altered sleeping 0 - - - -  Tired, decreased energy 0 - - - -  Change in appetite 0 - - - -  Feeling bad or failure about yourself  0 - - - -  Trouble concentrating 0 - - - -  Moving slowly or fidgety/restless 0 - - - -  Suicidal thoughts 0 - - - -  PHQ-9 Score 0 - - - -  Difficult doing work/chores - - - - -  Some recent data might be hidden    phq 9 is negative   Fall Risk: Fall Risk  06/29/2021 06/10/2021 04/28/2021 03/16/2021 12/02/2020  Falls in the past year? 0 0 0 0 0  Number falls in past yr: 0 0 0 0 0  Injury with Fall? 0 0 0 0 0  Risk for fall due to : No Fall Risks No Fall Risks No Fall Risks - No Fall Risks  Follow up Falls prevention discussed Falls prevention discussed Falls prevention discussed - Falls prevention discussed     Assessment & Plan  1. Cellulitis of right anterior lower leg  - levofloxacin (LEVAQUIN) 500 MG tablet; Take 1 tablet (500 mg total) by mouth daily for 7 days.  Dispense: 7 tablet; Refill: 0 - doxycycline (VIBRA-TABS) 100 MG tablet; Take 1 tablet (100 mg total) by mouth 2 (two) times daily.  Dispense: 14 tablet; Refill: 0   Researched UptoDate: we will try dual therapy, elevate legs   2. COPD, mild ([place])  - AMB Referral to [facility]

## 2021-06-30 ENCOUNTER — Telehealth: Payer: Self-pay | Admitting: *Deleted

## 2021-06-30 NOTE — Chronic Care Management (AMB) (Signed)
  Chronic Care Management   Note  06/30/2021 Name: Mark Nash MRN: [id number] DOB: [birth year]  Mark Nash is a 65 y.o. year old male who is a primary care patient of [name], MD. I reached out to [name] by phone today in response to a referral sent by [name]'s PCP.  [name] was given information about Chronic Care Management services today including:  CCM service includes personalized support from designated clinical staff supervised by his physician, including individualized plan of care and coordination with other care providers 24/7 contact phone numbers for assistance for urgent and routine care needs. Service will only be billed when office clinical staff spend 20 minutes or more in a month to coordinate care. Only one practitioner may furnish and bill the service in a calendar month. The patient may stop CCM services at any time (effective at the end of the month) by phone call to the office staff. The patient is responsible for co-pay (up to 20% after annual deductible is met) if co-pay is required by the individual health plan.   Patient agreed to services and verbal consent obtained.   Follow up plan: Telephone appointment with care management team member scheduled for: 07/20/2021  Julian Hy, [place] [facility]  Direct Dial: [phone]

## 2021-06-30 NOTE — Chronic Care Management (AMB) (Signed)
  Chronic Care Management   Outreach Note  06/30/2021 Name: STEEL KERNEY MRN: [id number] DOB: 1956/06/11  ESIAH BAZINET is a 65 y.o. year old male who is a primary care patient of [name], MD. I reached out to [name] by phone today in response to a referral sent by Mr. Korrey Schleicher Stamper's primary care provider.  An unsuccessful telephone outreach was attempted today. The patient was referred to the case management team for assistance with care management and care coordination.   Follow Up Plan: A HIPAA compliant phone message was left for the patient providing contact information and requesting a return call.  If patient returns call to provider office, please advise to call Embedded Care Management Care Guide Plummer Matich at [place], [facility]  Direct Dial: [phone]

## 2021-07-07 ENCOUNTER — Ambulatory Visit (HOSPITAL_COMMUNITY): Payer: PPO | Admitting: Nurse Practitioner

## 2021-07-07 DIAGNOSIS — D6869 Other thrombophilia: Secondary | ICD-10-CM | POA: Diagnosis not present

## 2021-07-07 DIAGNOSIS — E1151 Type 2 diabetes mellitus with diabetic peripheral angiopathy without gangrene: Secondary | ICD-10-CM | POA: Diagnosis not present

## 2021-07-07 DIAGNOSIS — I4819 Other persistent atrial fibrillation: Secondary | ICD-10-CM | POA: Diagnosis not present

## 2021-07-07 DIAGNOSIS — F17200 Nicotine dependence, unspecified, uncomplicated: Secondary | ICD-10-CM | POA: Diagnosis not present

## 2021-07-07 DIAGNOSIS — J449 Chronic obstructive pulmonary disease, unspecified: Secondary | ICD-10-CM | POA: Diagnosis not present

## 2021-07-07 DIAGNOSIS — E1142 Type 2 diabetes mellitus with diabetic polyneuropathy: Secondary | ICD-10-CM | POA: Diagnosis not present

## 2021-07-07 DIAGNOSIS — Z7951 Long term (current) use of inhaled steroids: Secondary | ICD-10-CM | POA: Diagnosis not present

## 2021-07-07 DIAGNOSIS — Z7901 Long term (current) use of anticoagulants: Secondary | ICD-10-CM | POA: Diagnosis not present

## 2021-07-07 DIAGNOSIS — M7989 Other specified soft tissue disorders: Secondary | ICD-10-CM | POA: Diagnosis not present

## 2021-07-07 DIAGNOSIS — Z794 Long term (current) use of insulin: Secondary | ICD-10-CM | POA: Diagnosis not present

## 2021-07-12 NOTE — Progress Notes (Signed)
Name: Mark Nash   MRN: [id number]    DOB: 1955-11-08   Date:07/13/2021       Progress Note  Subjective  Chief Complaint  Follow Up  HPI  Lumbar radiculitis: he had back surgery in 06/2016 and 08/2016. He is currently doing okay. Currently seeing  Dr. Humphrey Rolls and is taking , Tramadol, Tizanidine and higher dose gabapentin 300 mg morning, lunch and 2 at nihgt, his pain  is stable around 6/10    DMII: he is still on Levemir 6 units, denies hypoglycemia, not checking glucose due to cost of strips , his A1C has been at goal between 7-7.6% , he states eating more fruit and ice cream lately. Discussed going up to 7 units on holidays and to resume a healthier diet . He is on ACE and a statin, and has neuropathy. Pain from neuropathy is stable with  Gabapentin three times daily He also has PVD. He is due for his eye exam, he states scheduled for January  PAD: recent celulitis on right lower leg, doing better now, he is on statin therapy now but not on aspirin because it bothers his stomach/nauseated  ,  he states  pain medication helps and he can walk for 15 -25  minutes , seen by vascular surgeon in the past, he has not been wearing compression stocking hoses lately, just diabetic socks    COPD: he always has daily morning  productive cough. He has some SOB with activity but doing better since taking Trelegy. He can tell when he skips medication. He is still smoking more now 1.5-2 packs per day, not ready to quit    Hyperlipidemia: taking Pravastatin 20 mg daily and denies myalgias. Last LDL was 54, recheck next visit     HTN: taking Diltiazem for afib and bp  no chest pain, or palpitation    Schizophrenia: denies any hallucinations, no depressed mood or suicidal thoughts or ideation, not on medication and refuses to see Psychiatrist. He states he helps his family.  Mother died in 2023/03/01 but still helping his brother, he is on wheelchair. Stable.    Hepatitis C antibody positive: he has a remote  history of drug use from age 30 till 21. Used injectable drug use at the time, he has positive hepatitis C , he went to see GI but did not discuss hepatitis C. He has seen GI, he has a large polyp removed l 2019 and was supposed to go back for repeat colonoscopy but he keeps postponing . Unchanged    Chronic Constipation: he states bowel movements have improvement   He had EGD and colonoscopy in 2019 , past due for repeat testing secondary to large sigmoid polyp, he states because of COVID-19 ,  last visit he told me he was waiting to have dentures , he states now he wants to hold off until he can have the cardiac ablation before going back to have colonoscopy.    Senile purpura: both arms. on both upper extremity, stable.   Afib: found on auscultation during office visit , CHADVasc score of 4, he was seen by cardiologist , rate controlled with Diltiazem   Patient Active Problem List   Diagnosis Date Noted   Persistent atrial fibrillation ([place]) 05/07/2021   Diabetes mellitus type 2 with peripheral artery disease ([place]) 03/16/2021   DDD (degenerative disc disease), lumbar 03/06/2019   Primary osteoarthritis involving multiple joints 03/06/2019   Lumbar radiculopathy 11/05/2018   Lumbar post-laminectomy syndrome 11/05/2018   Diabetic  polyneuropathy associated with type 2 diabetes mellitus ([place]) 11/05/2018   CLE (columnar lined esophagus)    Gastric irritation    Duodenum ulcer    Personal history of colonic polyps    Polyp of sigmoid colon    Diverticulosis of large intestine without diverticulitis    Benign neoplasm of ascending colon    Osteoarthritis of both hands 09/04/2017   Chronic pain of left knee 09/04/2017   Lymphedema 07/07/2016   Chronic hepatitis C without hepatic coma ([place]) 06/01/2016   Lumbar herniated disc 11/24/2015   Thrombocytopenia ([facility]) 11/11/2015   Hepatitis C antibody test positive 07/13/2015   Chronic constipation 07/13/2015   CN (constipation) 07/13/2015    Diabetes mellitus with coincident hypertension ([place]) 03/08/2015   Dyslipidemia 03/08/2015   Paranoid schizophrenia ([place]) 03/08/2015   Restless leg 03/08/2015   Callus of foot 03/08/2015   Claudication ([place]) 03/08/2015   Chronic obstructive pulmonary disease ([place]) 03/08/2015   Corn or callus 03/08/2015   Essential (primary) hypertension 03/08/2015   HLD (hyperlipidemia) 03/08/2015   Peripheral vascular disease ([place]) 03/08/2015   Polyneuropathy 03/08/2015   Current tobacco use 03/08/2015   Vitamin D deficiency 04/26/2009   Avitaminosis D 04/26/2009    Past Surgical History:  Procedure Laterality Date   BACK SURGERY     CARDIOVERSION N/A 05/20/2021   Procedure: CARDIOVERSION;  Surgeon: Wellington Hampshire, MD;  Location: [facility];  Service: Cardiovascular;  Laterality: N/A;   COLONOSCOPY WITH PROPOFOL N/A 09/07/2017   Procedure: COLONOSCOPY WITH PROPOFOL;  Surgeon: Virgel Manifold, MD;  Location: [facility];  Service: Endoscopy;  Laterality: N/A;   ESOPHAGOGASTRODUODENOSCOPY (EGD) WITH PROPOFOL N/A 12/07/2017   Procedure: ESOPHAGOGASTRODUODENOSCOPY (EGD) WITH PROPOFOL;  Surgeon: Virgel Manifold, MD;  Location: [facility];  Service: Endoscopy;  Laterality: N/A;   FLEXIBLE SIGMOIDOSCOPY N/A 11/09/2017   Procedure: FLEXIBLE SIGMOIDOSCOPY;  Surgeon: Virgel Manifold, MD;  Location: [facility];  Service: Endoscopy;  Laterality: N/A;   HERNIA REPAIR     inguinal   INGUINAL HERNIA REPAIR Bilateral 06/21/2018   Procedure: LAPAROSCOPIC BILATERAL INGUINAL HERNIA REPAIR;  Surgeon: Vickie Epley, MD;  Location: [facility];  Service: General;  Laterality: Bilateral;   L3 TO L5 LAMINECTOMY FOR DECOMPRESSION  07/17/2016   [facility]   REPAIR OF CEREBROSPINAL FLUID LEAK N/A 09/08/2016   Procedure: Lumbar wound exploration, repair of pseudomenigocele;  Surgeon: Kevan Ny Ditty, MD;  Location: [facility];  Service: Neurosurgery;  Laterality: N/A;  Lumbar wound  exploration, repair of pseudomenigocele   SINUSOTOMY      Family History  Problem Relation Age of Onset   Heart failure Mother    Hypertension Mother    Diverticulitis Mother    Colitis Mother    Diabetes Father    Hypertension Father    Heart disease Father    Diabetes Sister    Heart failure Brother    Diverticulitis Brother     Social History   Tobacco Use   Smoking status: Every Day    Packs/day: 1.50    Years: 46.00    Pack years: 69.00    Types: Cigarettes   Smokeless tobacco: Never  Substance Use Topics   Alcohol use: No    Alcohol/week: 0.0 standard drinks     Current Outpatient Medications:    diltiazem (DILACOR XR) 240 MG 24 hr capsule, Take 1 capsule (240 mg total) by mouth daily., Disp: 90 capsule, Rfl: 3   Fluticasone-Umeclidin-Vilant (TRELEGY ELLIPTA) 100-62.5-25 MCG/INH AEPB, Take 1 puff  by mouth every evening. (Patient taking differently: Take 1 puff by mouth daily as needed (respiratory issues.).), Disp: 60 each, Rfl: 5   gabapentin (NEURONTIN) 300 MG capsule, Take 300-600 mg by mouth See admin instructions. Take 1 capsule (300 mg) by mouth in the morning, take 1 capsule (300 mg) by mouth at noon, and 2 capsules (600 mg) by mouth in the evening, Disp: , Rfl:    insulin detemir (LEVEMIR) 100 UNIT/ML injection, Inject 0.06 mLs (6 Units total) into the skin at bedtime., Disp: 10 mL, Rfl: 0   rivaroxaban (XARELTO) 20 MG TABS tablet, Take 1 tablet (20 mg total) by mouth daily with supper., Disp: 90 tablet, Rfl: 1   tiZANidine (ZANAFLEX) 2 MG tablet, Take 2 mg by mouth every 12 (twelve) hours., Disp: , Rfl:    traMADol (ULTRAM) 50 MG tablet, Take 50 mg by mouth every 12 (twelve) hours., Disp: , Rfl:    ergocalciferol (VITAMIN D2) 1.25 MG (50000 UT) capsule, Take 1 capsule (50,000 Units total) by mouth every 14 (fourteen) days., Disp: 6 capsule, Rfl: 3   pravastatin (PRAVACHOL) 20 MG tablet, Take 1 tablet (20 mg total) by mouth daily with supper., Disp: 90 tablet,  Rfl: 3  Allergies  Allergen Reactions   Penicillins Hives and Swelling    As a child. Has patient had a PCN reaction causing immediate rash, facial/tongue/throat swelling, SOB or lightheadedness with hypotension: YES  Has patient had a PCN reaction causing severe rash involving mucus membranes or skin necrosis: UNKNOWN Has patient had a PCN reaction that required hospitalization: UNKNOWN Has patient had a PCN reaction occurring within the last 10 years: NO   Aspirin Nausea Only   Ibuprofen Nausea Only   Lyrica [Pregabalin] Nausea Only   Acetaminophen Nausea Only    I personally reviewed active problem list, medication list, allergies, family history, social history, health maintenance with the patient/caregiver today.   ROS  Constitutional: Negative for fever or weight change.  Respiratory: Negative for cough and shortness of breath.   Cardiovascular: Negative for chest pain or palpitations.  Gastrointestinal: Negative for abdominal pain, no bowel changes.  Musculoskeletal: positive for gait problem or joint swelling.  Skin: positive  for rash - legs from PVD  Neurological: Negative for dizziness or headache.  No other specific complaints in a complete review of systems (except as listed in HPI above).   Objective  Vitals:   07/13/21 1308  BP: 130/72  Pulse: 89  Resp: 16  Temp: 98 F (36.7 C)  SpO2: 98%  Weight: 172 lb (78 kg)  Height: '5\' 11"'  (1.803 m)    Body mass index is 23.99 kg/m.  Physical Exam  Constitutional: Patient appears well-developed and well-nourished.  No distress.  HEENT: head atraumatic, normocephalic, pupils equal and reactive to light, neck supple Cardiovascular: Normal rate, regular rhythm and normal heart sounds.  No murmur heard. Trace  BLE edema. Pulmonary/Chest: Effort normal and breath sounds normal. No respiratory distress. Abdominal: Soft.  There is no tenderness. Psychiatric: Patient has a normal mood and affect. behavior is normal.  Judgment and thought content normal.   Recent Results (from the past 2160 hour(s))  Basic metabolic panel     Status: Abnormal   Collection Time: 04/19/21 10:04 PM  Result Value Ref Range   Sodium 135 135 - 145 mmol/L   Potassium 4.5 3.5 - 5.1 mmol/L   Chloride 93 (L) 98 - 111 mmol/L   CO2 22 22 - 32 mmol/L   Glucose, Bld  132 (H) 70 - 99 mg/dL    Comment: Glucose reference range applies only to samples taken after fasting for at least 8 hours.   BUN 26 (H) 8 - 23 mg/dL   Creatinine, Ser 1.34 (H) 0.61 - 1.24 mg/dL   Calcium 9.6 8.9 - 10.3 mg/dL   GFR, Estimated 58 (L) >60 mL/min    Comment: (NOTE) Calculated using the CKD-EPI Creatinine Equation (2021)    Anion gap 20 (H) 5 - 15    Comment: Performed at [facility], [address] [place], [place] [address]  CBC     Status: Abnormal   Collection Time: 04/19/21 10:04 PM  Result Value Ref Range   WBC 7.2 4.0 - 10.5 K/uL   RBC 6.20 (H) 4.22 - 5.81 MIL/uL   Hemoglobin 19.5 (H) 13.0 - 17.0 g/dL   HCT 55.2 (H) 39.0 - 52.0 %   MCV 89.0 80.0 - 100.0 fL   MCH 31.5 26.0 - 34.0 pg   MCHC 35.3 30.0 - 36.0 g/dL   RDW 12.6 11.5 - 15.5 %   Platelets 159 150 - 400 K/uL   nRBC 0.0 0.0 - 0.2 %    Comment: Performed at [facility], [address] [place], [place] [address]  Urinalysis, Complete w Microscopic     Status: Abnormal   Collection Time: 04/19/21 10:04 PM  Result Value Ref Range   Color, Urine YELLOW (A) YELLOW   APPearance HAZY (A) CLEAR   Specific Gravity, Urine 1.019 1.005 - 1.030   pH 6.0 5.0 - 8.0   Glucose, UA NEGATIVE NEGATIVE mg/dL   Hgb urine dipstick MODERATE (A) NEGATIVE   Bilirubin Urine NEGATIVE NEGATIVE   Ketones, ur 80 (A) NEGATIVE mg/dL   Protein, ur 30 (A) NEGATIVE mg/dL   Nitrite NEGATIVE NEGATIVE   Leukocytes,Ua NEGATIVE NEGATIVE   RBC / HPF 0-5 0 - 5 RBC/hpf   WBC, UA 0-5 0 - 5 WBC/hpf   Bacteria, UA NONE SEEN NONE SEEN   Squamous Epithelial / LPF 0-5 0 - 5   Mucus PRESENT     Hyaline Casts, UA PRESENT     Comment: Performed at [facility], [place], [place] [address]  Troponin I (High Sensitivity)     Status: None   Collection Time: 04/19/21 10:04 PM  Result Value Ref Range   Troponin I (High Sensitivity) 14 <18 ng/L    Comment: (NOTE) Elevated high sensitivity troponin I (hsTnI) values and significant  changes across serial measurements may suggest ACS but many other  chronic and acute conditions are known to elevate hsTnI results.  Refer to the "Links" section for chest pain algorithms and additional  guidance. Performed at [facility], [address] [place], [place] [address]   Magnesium     Status: None   Collection Time: 04/19/21 10:04 PM  Result Value Ref Range   Magnesium 2.0 1.7 - 2.4 mg/dL    Comment: Performed at [facility], [address] [place], [place] [address]  CK     Status: None   Collection Time: 04/19/21 10:04 PM  Result Value Ref Range   Total CK 175 49 - 397 U/L    Comment: Performed at [facility], [address] [place], [place] [address]  TSH     Status: None   Collection Time: 04/19/21 10:04 PM  Result Value Ref Range   TSH 1.706 0.350 - 4.500 uIU/mL    Comment: Performed by  a 3rd Generation assay with a functional sensitivity of <=0.01 uIU/mL. Performed at [facility], [address], [place], [place] [address]   Urine Drug Screen, Qualitative [facility] only)     Status: Abnormal   Collection Time: 04/19/21 10:04 PM  Result Value Ref Range   Tricyclic, Ur Screen NONE DETECTED NONE DETECTED   Amphetamines, Ur Screen NONE DETECTED NONE DETECTED   MDMA (Ecstasy)Ur Screen NONE DETECTED NONE DETECTED   Cocaine Metabolite,Ur [place] NONE DETECTED NONE DETECTED   Opiate, Ur Screen NONE DETECTED NONE DETECTED   Phencyclidine (PCP) Ur S NONE DETECTED NONE DETECTED   Cannabinoid 50 Ng, Ur [place] POSITIVE (A) NONE DETECTED   Barbiturates, Ur Screen NONE DETECTED NONE  DETECTED   Benzodiazepine, Ur Scrn NONE DETECTED NONE DETECTED   Methadone Scn, Ur NONE DETECTED NONE DETECTED    Comment: (NOTE) Tricyclics + metabolites, urine    Cutoff 1000 ng/mL Amphetamines + metabolites, urine  Cutoff 1000 ng/mL MDMA (Ecstasy), urine              Cutoff 500 ng/mL Cocaine Metabolite, urine          Cutoff 300 ng/mL Opiate + metabolites, urine        Cutoff 300 ng/mL Phencyclidine (PCP), urine         Cutoff 25 ng/mL Cannabinoid, urine                 Cutoff 50 ng/mL Barbiturates + metabolites, urine  Cutoff 200 ng/mL Benzodiazepine, urine              Cutoff 200 ng/mL Methadone, urine                   Cutoff 300 ng/mL  The urine drug screen provides only a preliminary, unconfirmed analytical test result and should not be used for non-medical purposes. Clinical consideration and professional judgment should be applied to any positive drug screen result due to possible interfering substances. A more specific alternate chemical method must be used in order to obtain a confirmed analytical result. Gas chromatography / mass spectrometry (GC/MS) is the preferred confirm atory method. Performed at [facility], [address] [place], [place] [address]   Resp Panel by RT-PCR (Flu A&B, Covid) Nasopharyngeal Swab     Status: Abnormal   Collection Time: 04/19/21 11:26 PM   Specimen: Nasopharyngeal Swab; Nasopharyngeal(NP) swabs in vial transport medium  Result Value Ref Range   SARS Coronavirus 2 by RT PCR POSITIVE (A) NEGATIVE    Comment: RESULT CALLED TO, READ BACK BY AND VERIFIED WITH: RAQUEL DAVID '@[id number]'  ON 04/20/21 SK[facility] (NOTE) SARS-CoV-2 target nucleic acids are DETECTED.  The SARS-CoV-2 RNA is generally detectable in upper respiratory specimens during the acute phase of infection. Positive results are indicative of the presence of the identified virus, but do not rule out bacterial infection or co-infection with other pathogens not detected by the  test. Clinical correlation with patient history and other diagnostic information is necessary to determine patient infection status. The expected result is Negative.  Fact Sheet for Patients: EntrepreneurPulse.com.au  Fact Sheet for Healthcare Providers: IncredibleEmployment.be  This test is not yet approved or cleared by the [place] FDA and  has been authorized for detection and/or diagnosis of SARS-CoV-2 by FDA under an Emergency Use Authorization (EUA).  This EUA will remain in effect (meaning this test can be  used) for the duration of  the COVID-19 declaration under Section 564(b)(1) of the Act, 21 U.S.C. section  360bbb-3(b)(1), unless the authorization is terminated or revoked sooner.     Influenza A by PCR NEGATIVE NEGATIVE   Influenza B by PCR NEGATIVE NEGATIVE    Comment: (NOTE) The Xpert Xpress SARS-CoV-2/FLU/RSV plus assay is intended as an aid in the diagnosis of influenza from Nasopharyngeal swab specimens and should not be used as a sole basis for treatment. Nasal washings and aspirates are unacceptable for Xpert Xpress SARS-CoV-2/FLU/RSV testing.  Fact Sheet for Patients: EntrepreneurPulse.com.au  Fact Sheet for Healthcare Providers: IncredibleEmployment.be  This test is not yet approved or cleared by the [place] FDA and has been authorized for detection and/or diagnosis of SARS-CoV-2 by FDA under an Emergency Use Authorization (EUA). This EUA will remain in effect (meaning this test can be used) for the duration of the COVID-19 declaration under Section 564(b)(1) of the Act, 21 U.S.C. section 360bbb-3(b)(1), unless the authorization is terminated or revoked.  Performed at [facility], [place], [place] [address]   Troponin I (High Sensitivity)     Status: None   Collection Time: 04/20/21  1:31 AM  Result Value Ref Range   Troponin I (High  Sensitivity) 11 <18 ng/L    Comment: (NOTE) Elevated high sensitivity troponin I (hsTnI) values and significant  changes across serial measurements may suggest ACS but many other  chronic and acute conditions are known to elevate hsTnI results.  Refer to the "Links" section for chest pain algorithms and additional  guidance. Performed at [facility], [address] [place], [place] [address]   CBG monitoring, ED     Status: Abnormal   Collection Time: 04/20/21  8:08 AM  Result Value Ref Range   Glucose-Capillary 164 (H) 70 - 99 mg/dL    Comment: Glucose reference range applies only to samples taken after fasting for at least 8 hours.  Protime-INR     Status: None   Collection Time: 04/20/21  9:20 AM  Result Value Ref Range   Prothrombin Time 13.4 11.4 - 15.2 seconds   INR 1.0 0.8 - 1.2    Comment: (NOTE) INR goal varies based on device and disease states. Performed at [facility], [address] [place], [place] [address]   APTT     Status: None   Collection Time: 04/20/21  9:20 AM  Result Value Ref Range   aPTT 28 24 - 36 seconds    Comment: Performed at [facility], [address] [place], [place] [address]  ECHOCARDIOGRAM COMPLETE     Status: None   Collection Time: 04/20/21 10:59 AM  Result Value Ref Range   Weight 2,640 oz   Height 71 in   BP 133/73 mmHg   S' Lateral 2.80 cm  CBG monitoring, ED     Status: Abnormal   Collection Time: 04/20/21 11:56 AM  Result Value Ref Range   Glucose-Capillary 116 (H) 70 - 99 mg/dL    Comment: Glucose reference range applies only to samples taken after fasting for at least 8 hours.  HIV Antibody (routine testing w rflx)     Status: None   Collection Time: 04/20/21  4:02 PM  Result Value Ref Range   HIV Screen 4th Generation wRfx Non Reactive Non Reactive    Comment: Performed at [facility], [place] [address] [place], [place] [address]  Heparin level (unfractionated)     Status: Abnormal    Collection Time: 04/20/21  4:02 PM  Result Value Ref Range   Heparin Unfractionated  0.93 (H) 0.30 - 0.70 IU/mL    Comment: (NOTE) The clinical reportable range upper limit is being lowered to >1.10 to align with the FDA approved guidance for the current laboratory assay.  If heparin results are below expected values, and patient dosage has  been confirmed, suggest follow up testing of antithrombin III levels. Performed at [facility], [address] [place], [place] [address]   Glucose, capillary     Status: Abnormal   Collection Time: 04/20/21  4:06 PM  Result Value Ref Range   Glucose-Capillary 137 (H) 70 - 99 mg/dL    Comment: Glucose reference range applies only to samples taken after fasting for at least 8 hours.  Glucose, capillary     Status: Abnormal   Collection Time: 04/20/21  7:46 PM  Result Value Ref Range   Glucose-Capillary 185 (H) 70 - 99 mg/dL    Comment: Glucose reference range applies only to samples taken after fasting for at least 8 hours.  Heparin level (unfractionated)     Status: None   Collection Time: 04/21/21 12:35 AM  Result Value Ref Range   Heparin Unfractionated 0.33 0.30 - 0.70 IU/mL    Comment: (NOTE) The clinical reportable range upper limit is being lowered to >1.10 to align with the FDA approved guidance for the current laboratory assay.  If heparin results are below expected values, and patient dosage has  been confirmed, suggest follow up testing of antithrombin III levels. Performed at [facility], [address] [place], [place] [address]   CBC     Status: Abnormal   Collection Time: 04/21/21 12:35 AM  Result Value Ref Range   WBC 5.2 4.0 - 10.5 K/uL   RBC 5.01 4.22 - 5.81 MIL/uL   Hemoglobin 16.3 13.0 - 17.0 g/dL   HCT 44.5 39.0 - 52.0 %   MCV 88.8 80.0 - 100.0 fL   MCH 32.5 26.0 - 34.0 pg   MCHC 36.6 (H) 30.0 - 36.0 g/dL   RDW 12.3 11.5 - 15.5 %   Platelets 137 (L) 150 - 400 K/uL   nRBC 0.0 0.0 - 0.2 %     Comment: Performed at [facility], [address], [place], [place] [address]  Basic metabolic panel     Status: Abnormal   Collection Time: 04/21/21 12:35 AM  Result Value Ref Range   Sodium 134 (L) 135 - 145 mmol/L   Potassium 3.8 3.5 - 5.1 mmol/L   Chloride 101 98 - 111 mmol/L   CO2 22 22 - 32 mmol/L   Glucose, Bld 142 (H) 70 - 99 mg/dL    Comment: Glucose reference range applies only to samples taken after fasting for at least 8 hours.   BUN 16 8 - 23 mg/dL   Creatinine, Ser 0.70 0.61 - 1.24 mg/dL   Calcium 8.7 (L) 8.9 - 10.3 mg/dL   GFR, Estimated >60 >60 mL/min    Comment: (NOTE) Calculated using the CKD-EPI Creatinine Equation (2021)    Anion gap 11 5 - 15    Comment: Performed at [facility], [place], [place] [address]  Heparin level (unfractionated)     Status: None   Collection Time: 04/21/21  6:57 AM  Result Value Ref Range   Heparin Unfractionated 0.30 0.30 - 0.70 IU/mL    Comment: (NOTE) The clinical reportable range upper limit is being lowered to >1.10 to align with the FDA approved guidance for the current laboratory assay.  If  heparin results are below expected values, and patient dosage has  been confirmed, suggest follow up testing of antithrombin III levels. Performed at [facility], [address] [place], [place] [address]   Glucose, capillary     Status: Abnormal   Collection Time: 04/21/21  8:29 AM  Result Value Ref Range   Glucose-Capillary 114 (H) 70 - 99 mg/dL    Comment: Glucose reference range applies only to samples taken after fasting for at least 8 hours.   Comment 1 Notify RN   Glucose, capillary     Status: Abnormal   Collection Time: 04/21/21 12:38 PM  Result Value Ref Range   Glucose-Capillary 170 (H) 70 - 99 mg/dL    Comment: Glucose reference range applies only to samples taken after fasting for at least 8 hours.  Glucose, capillary     Status: Abnormal   Collection Time: 04/21/21   6:17 PM  Result Value Ref Range   Glucose-Capillary 133 (H) 70 - 99 mg/dL    Comment: Glucose reference range applies only to samples taken after fasting for at least 8 hours.  Glucose, capillary     Status: Abnormal   Collection Time: 04/21/21  8:13 PM  Result Value Ref Range   Glucose-Capillary 189 (H) 70 - 99 mg/dL    Comment: Glucose reference range applies only to samples taken after fasting for at least 8 hours.  CBC     Status: Abnormal   Collection Time: 04/22/21  4:56 AM  Result Value Ref Range   WBC 5.3 4.0 - 10.5 K/uL   RBC 4.97 4.22 - 5.81 MIL/uL   Hemoglobin 15.9 13.0 - 17.0 g/dL   HCT 44.3 39.0 - 52.0 %   MCV 89.1 80.0 - 100.0 fL   MCH 32.0 26.0 - 34.0 pg   MCHC 35.9 30.0 - 36.0 g/dL   RDW 12.3 11.5 - 15.5 %   Platelets 132 (L) 150 - 400 K/uL   nRBC 0.0 0.0 - 0.2 %    Comment: Performed at [facility], [address] [place], [place] [address]  Glucose, capillary     Status: Abnormal   Collection Time: 04/22/21  9:05 AM  Result Value Ref Range   Glucose-Capillary 143 (H) 70 - 99 mg/dL    Comment: Glucose reference range applies only to samples taken after fasting for at least 8 hours.  POCT Occult Blood Stool     Status: None   Collection Time: 04/28/21 10:12 AM  Result Value Ref Range   Fecal Occult Blood, POC Negative Negative   Card #1 Date     Card #2 Fecal Occult Blod, POC     Card #2 Date     Card #3 Fecal Occult Blood, POC     Card #3 Date    NM Myocar Multi W/Spect W/Wall Motion / EF     Status: None   Collection Time: 05/12/21 10:32 AM  Result Value Ref Range   ST Depression (mm) 0 mm   Rest Nuclear Isotope Dose 10.9 mCi   Stress Nuclear Isotope Dose 30.6 mCi   SSS 0.0    SRS 13.0    SDS 0.0    TID 1.12    LV sys vol 27.0 mL   LV dias vol 77.0 62 - 150 mL   Nuc Stress EF 65 %  Glucose, capillary     Status: Abnormal   Collection Time: 05/20/21  6:52 AM  Result Value Ref Range   Glucose-Capillary  115 (H) 70 - 99 mg/dL    Comment:  Glucose reference range applies only to samples taken after fasting for at least 8 hours.  Basic Metabolic Panel (BMET)     Status: Abnormal   Collection Time: 05/25/21 12:16 PM  Result Value Ref Range   Glucose 143 (H) 70 - 99 mg/dL    Comment:               **Please note reference interval change**   BUN 11 8 - 27 mg/dL   Creatinine, Ser 0.77 0.76 - 1.27 mg/dL   eGFR 99 >59 mL/min/1.73   BUN/Creatinine Ratio 14 10 - 24   Sodium 140 134 - 144 mmol/L   Potassium 4.3 3.5 - 5.2 mmol/L   Chloride 102 96 - 106 mmol/L   CO2 25 20 - 29 mmol/L   Calcium 8.8 8.6 - 10.2 mg/dL  CBC w/Diff     Status: None   Collection Time: 05/25/21 12:16 PM  Result Value Ref Range   WBC 6.7 3.4 - 10.8 x10E3/uL   RBC 4.83 4.14 - 5.80 x10E6/uL   Hemoglobin 15.2 13.0 - 17.7 g/dL   Hematocrit 43.3 37.5 - 51.0 %   MCV 90 79 - 97 fL   MCH 31.5 26.6 - 33.0 pg   MCHC 35.1 31.5 - 35.7 g/dL   RDW 12.2 11.6 - 15.4 %   Platelets 167 150 - 450 x10E3/uL   Neutrophils 63 Not Estab. %   Lymphs 25 Not Estab. %   Monocytes 9 Not Estab. %   Eos 2 Not Estab. %   Basos 1 Not Estab. %   Neutrophils Absolute 4.2 1.4 - 7.0 x10E3/uL   Lymphocytes Absolute 1.7 0.7 - 3.1 x10E3/uL   Monocytes Absolute 0.6 0.1 - 0.9 x10E3/uL   EOS (ABSOLUTE) 0.2 0.0 - 0.4 x10E3/uL   Basophils Absolute 0.0 0.0 - 0.2 x10E3/uL   Immature Granulocytes 0 Not Estab. %   Immature Grans (Abs) 0.0 0.0 - 0.1 x10E3/uL  Glucose, capillary     Status: Abnormal   Collection Time: 06/09/21  8:07 AM  Result Value Ref Range   Glucose-Capillary 119 (H) 70 - 99 mg/dL    Comment: Glucose reference range applies only to samples taken after fasting for at least 8 hours.  ECHOCARDIOGRAM COMPLETE     Status: None   Collection Time: 06/28/21  3:48 PM  Result Value Ref Range   AR max vel 2.02 cm2   AV Peak grad 3.6 mmHg   Ao pk vel 0.94 m/s   S' Lateral 2.80 cm   Area-P 1/2 4.65 cm2   AV Area VTI 1.83 cm2   AV Mean grad 2.0 mmHg   AV Area mean vel 1.69 cm2   POCT HgB A1C     Status: Abnormal   Collection Time: 07/13/21  1:25 PM  Result Value Ref Range   Hemoglobin A1C 7.6 (A) 4.0 - 5.6 %   HbA1c POC (<> result, manual entry)     HbA1c, POC (prediabetic range)     HbA1c, POC (controlled diabetic range)      Diabetic Foot Exam: Diabetic Foot Exam - Simple   Simple Foot Form Diabetic Foot exam was performed with the following findings: Yes 07/13/2021  2:24 PM  Visual Inspection See comments: Yes Sensation Testing Intact to touch and monofilament testing bilaterally: Yes Pulse Check Posterior Tibialis and Dorsalis pulse intact bilaterally: Yes Comments Callus formation, bunion and has PVD      PHQ2/9:  Depression screen [facility] 2/9 07/13/2021 06/29/2021 06/10/2021 04/28/2021 03/16/2021  Decreased Interest 0 0 0 0 0  Down, Depressed, Hopeless 0 0 0 0 0  PHQ - 2 Score 0 0 0 0 0  Altered sleeping 0 0 - - -  Tired, decreased energy 0 0 - - -  Change in appetite 0 0 - - -  Feeling bad or failure about yourself  0 0 - - -  Trouble concentrating 0 0 - - -  Moving slowly or fidgety/restless 0 0 - - -  Suicidal thoughts 0 0 - - -  PHQ-9 Score 0 0 - - -  Difficult doing work/chores - - - - -  Some recent data might be hidden    phq 9 is negative   Fall Risk: Fall Risk  07/13/2021 06/29/2021 06/10/2021 04/28/2021 03/16/2021  Falls in the past year? 0 0 0 0 0  Number falls in past yr: 0 0 0 0 0  Injury with Fall? 0 0 0 0 0  Risk for fall due to : No Fall Risks No Fall Risks No Fall Risks No Fall Risks -  Follow up Falls prevention discussed Falls prevention discussed Falls prevention discussed Falls prevention discussed -      Functional Status Survey: Is the patient deaf or have difficulty hearing?: No Does the patient have difficulty seeing, even when wearing glasses/contacts?: Yes Does the patient have difficulty concentrating, remembering, or making decisions?: No Does the patient have difficulty walking or climbing stairs?: Yes Does  the patient have difficulty dressing or bathing?: No Does the patient have difficulty doing errands alone such as visiting a doctor's office or shopping?: No    Assessment & Plan  1. Type 2 diabetes mellitus with diabetic polyneuropathy, with long-term current use of insulin (HCC)  - POCT HgB A1C  2. Senile [place] (HCC)  Stable   3. Chronic a-fib (HCC)  Rate controlled, going to have cardioversion soon   4. Claudication ([place])  Stable   5. COPD, moderate ([place])  Medication helps  6. Peripheral vascular disease ([place])  With recent cellulitis   7. Paranoid schizophrenia ([place])  Doing well   8. Benign hypertension   9. Vitamin D deficiency   10. Dyslipidemia  - pravastatin (PRAVACHOL) 20 MG tablet; Take 1 tablet (20 mg total) by mouth daily with supper.  Dispense: 90 tablet; Refill: 3

## 2021-07-13 ENCOUNTER — Encounter: Payer: Self-pay | Admitting: Family Medicine

## 2021-07-13 ENCOUNTER — Ambulatory Visit (INDEPENDENT_AMBULATORY_CARE_PROVIDER_SITE_OTHER): Payer: PPO | Admitting: Family Medicine

## 2021-07-13 ENCOUNTER — Other Ambulatory Visit: Payer: Self-pay

## 2021-07-13 VITALS — BP 130/72 | HR 89 | Temp 98.0°F | Resp 16 | Ht 71.0 in | Wt 172.0 lb

## 2021-07-13 DIAGNOSIS — Z794 Long term (current) use of insulin: Secondary | ICD-10-CM

## 2021-07-13 DIAGNOSIS — E559 Vitamin D deficiency, unspecified: Secondary | ICD-10-CM

## 2021-07-13 DIAGNOSIS — E1142 Type 2 diabetes mellitus with diabetic polyneuropathy: Secondary | ICD-10-CM | POA: Diagnosis not present

## 2021-07-13 DIAGNOSIS — E785 Hyperlipidemia, unspecified: Secondary | ICD-10-CM | POA: Diagnosis not present

## 2021-07-13 DIAGNOSIS — I10 Essential (primary) hypertension: Secondary | ICD-10-CM

## 2021-07-13 DIAGNOSIS — F200 Paranoid schizophrenia: Secondary | ICD-10-CM | POA: Diagnosis not present

## 2021-07-13 DIAGNOSIS — J449 Chronic obstructive pulmonary disease, unspecified: Secondary | ICD-10-CM

## 2021-07-13 DIAGNOSIS — I482 Chronic atrial fibrillation, unspecified: Secondary | ICD-10-CM

## 2021-07-13 DIAGNOSIS — I739 Peripheral vascular disease, unspecified: Secondary | ICD-10-CM

## 2021-07-13 DIAGNOSIS — D692 Other nonthrombocytopenic purpura: Secondary | ICD-10-CM | POA: Diagnosis not present

## 2021-07-13 LAB — POCT GLYCOSYLATED HEMOGLOBIN (HGB A1C): Hemoglobin A1C: 7.6 % — AB (ref 4.0–5.6)

## 2021-07-13 MED ORDER — ERGOCALCIFEROL 1.25 MG (50000 UT) PO CAPS: 50000.0000 [IU] | ORAL_CAPSULE | ORAL | 3 refills | Status: DC

## 2021-07-13 MED ORDER — PRAVASTATIN SODIUM 20 MG PO TABS: 20.0000 mg | ORAL_TABLET | Freq: Every day | ORAL | 3 refills | Status: DC

## 2021-07-18 ENCOUNTER — Telehealth: Payer: Self-pay

## 2021-07-18 NOTE — Progress Notes (Signed)
Chronic Care Management Pharmacy Assistant   Name: Mark Nash  MRN: [id number] DOB: 1955-10-08  Initial Visit with CPP on 07/20/2021 @ 1330 Initial Visit Question Assessment for CPP/Patient Assistant Application for Trelegy  Conditions to be addressed/monitored: Atrial Fibrillation, HTN, HLD, COPD, and Diabetes mellitus with Coincident Hypertension, Peripheral Vascular Disease, Diabetes Mellitus Type 2 with Peripheral Artery Disease,Chronic Hepatitis C without Hepatic Coma, Polyneuropathy, Lumbar radiculopathy, Osteoarthritis of both hands, DDD, Primary Osteoarthritis involving multiple joints, Dyslipidemia, Paranoid Schizophrenia, Claudication, Chronic Pain of Left Knee,    Primary concerns for visit include: Patient stated he is having issues with affording his Trelegy and his Xarelto. He is thinking about switching to Eliquis, but he knows that this medication is just as expensive   Recent office visits:  07/13/2021 [name], MD (PCP Office Visit) for Follow-up- Stopped: Doxycycline Hyclate, Metoprolol Tartrate due to patient not taking, Lab order placed, patient instructed to return in 4 months  06/29/2021 [name], MD (PCP Office Visit) for Cellulitis-  Start: Doxycycline Hyclate 100 mg twice daily, Levofloxacin 500 mg daily, no follow-up noted  06/10/2021 [name], MD (PCP Office Visit) for Swollen Calf- No medication changes noted, US Venous lmg Lower Unilateral Right (DVT) ordered, No follow-up noted  04/28/2021 [name], MD (PCP Office Visit) for Hospital Discharge follow-up- No medication changes noted, lab order placed, no follow-up noted, patient instructed to return in 4 weeks  03/16/2021 [name], MD (PCP Office Visit) for Follow-Up- Start: Rivaroxaban 20 mg daily, Changed: Gabapentin 300 mg cap 1-2 capsules 3 times daily, Insulin Detemir 6 units Subcutaneous Daily at bedtime  Recent consult visits:  06/17/2021 [name], DPM  (Podiatry) for Diabetes- No medication changes noted, no orders placed, patient instructed to return in 3 months  05/25/2021 [name], MD (Cardiology) for Persistent AFIB- No medication changes noted, lab order placed, CT Cardiac Morph/PULM Vein/W/CM&W/O, patient instructed to follow-up in 4 weeks  05/06/2021 [name], MD (Cardiology) for Initial Consult- Changed: Erogocalciferol 50,000 Units Oral, Every other week, Gabapentin 300 mg Take one capsule in the morning, one at noon, and 2 capsule in the evening, NM Myocar Multi W/Spect W/Wall Motion, EKG 12-Lead, Cardiac Stress Test  03/08/2021 [name], MD (Rheumatology) for Arthritis- No medication changes noted, lab order placed, patient instructed to follow-up in 1 year  Hospital visits:  Medication Reconciliation was completed by comparing discharge summary, patient's EMR and Pharmacy list, and upon discussion with patient.  Admitted to the hospital on 06/09/2021  due to Cardiology. Discharge date was 06/09/2021. Discharged from [place]?Medications Started at [facility] Discharge:?? -START taking: sulfamethoxazole-trimethoprim (BACTRIM DS) 800-160 MG tablet Take 1 tablet by mouth 2 (two) times daily for 7 days.  Medication Changes at Hospital Discharge: -Changed None ID   Medications Discontinued at Hospital Discharge: -STOP taking: None ID  Medications that remain the same after Hospital Discharge:??  -All other medications will remain the same.    Medication Reconciliation was completed by comparing discharge summary, patient's EMR and Pharmacy list, and upon discussion with patient.  Admitted to the hospital on 05/20/2021  due to Cardioversion. Discharge date was 05/20/2021. Discharged from [place]?Medications Started at [facility] Discharge:?? -START taking: None ID  Medication Changes at Hospital Discharge: -Changed None ID    Medications Discontinued at Hospital Discharge: -STOP taking: None ID  Medications that remain the same after Hospital Discharge:??  -All other medications will remain the same.  Medication Reconciliation was completed by comparing discharge summary, patient's EMR and Pharmacy list, and upon discussion with patient.  Admitted to the hospital on 04/19/2021  due to COPD, HTN, , DM II, Rapid AFIB, AKI,. Discharge date was 04/22/2021. Discharged from [place]?Medications Started at [facility] Discharge:?? -START taking: diltiazem (DILACOR XR) 240 MG 24 hr capsule Take 1 capsule (240 mg total) by mouth daily.  Medication Changes at Hospital Discharge: -Changed None ID   Medications Discontinued at Hospital Discharge: -STOP taking: lisinopril-hydrochlorothiazide 20-12.5 MG tablet (ZESTORETIC) meloxicam 7.5 MG tablet (MOBIC)  Medications that remain the same after Hospital Discharge:??  -All other medications will remain the same.    Medications: Outpatient Encounter Medications as of 07/18/2021  Medication Sig   diltiazem (DILACOR XR) 240 MG 24 hr capsule Take 1 capsule (240 mg total) by mouth daily.   ergocalciferol (VITAMIN D2) 1.25 MG (50000 UT) capsule Take 1 capsule (50,000 Units total) by mouth every 14 (fourteen) days.   Fluticasone-Umeclidin-Vilant (TRELEGY ELLIPTA) 100-62.5-25 MCG/INH AEPB Take 1 puff by mouth every evening. (Patient taking differently: Take 1 puff by mouth daily as needed (respiratory issues.).)   gabapentin (NEURONTIN) 300 MG capsule Take 300-600 mg by mouth See admin instructions. Take 1 capsule (300 mg) by mouth in the morning, take 1 capsule (300 mg) by mouth at noon, and 2 capsules (600 mg) by mouth in the evening   insulin detemir (LEVEMIR) 100 UNIT/ML injection Inject 0.06 mLs (6 Units total) into the skin at bedtime.   pravastatin (PRAVACHOL) 20 MG tablet Take 1 tablet (20 mg total) by mouth daily with supper.    rivaroxaban (XARELTO) 20 MG TABS tablet Take 1 tablet (20 mg total) by mouth daily with supper.   tiZANidine (ZANAFLEX) 2 MG tablet Take 2 mg by mouth every 12 (twelve) hours.   traMADol (ULTRAM) 50 MG tablet Take 50 mg by mouth every 12 (twelve) hours.   No facility-administered encounter medications on file as of 07/18/2021.   Care Gaps: Diabetic Eye Exam Zoster Vaccines Colonoscopy COVID-19 Vaccine  Star Rating Drugs: Pravastatin 20 mg last filled on 04/06/2021 for a 90-Day supply with [facility]  Have you seen any other providers since your last visit? No  Any changes in your medications or health?   Yes, Patient stated that he started having shortness of breath around the end of last year is why he ended up on the Trelegy. He reports that he is currently in the doughnut hole, and is having a hard time affording this medication.  Patient stated he was given samples of the Trelegy, but he is almost out of the medication. Patient stated he also has been having more heart related issues which is why he is on the Xarelto, but this medication isn't affordable either. He was thinking about switching to Warfarin, but would like to stay on Xarelto or go to Eliquis due to all the limitations that come along with Warfarin   Any side effects from any medications?   Patient reports that as far as he knows he is not experiencing any side effects.  Do you have an symptoms or problems not managed by your medications?   Patient feels like the medications that he is on is managing his symptoms he just hopes he can stay on these medications   Any concerns about your health right now? No  Has your provider asked that you check blood pressure, blood sugar, or follow special diet at  home?   Patient reports that he does not check his blood pressure at home, he is a diabetic so he does check his blood sugars. He reports that his A1C has increased so PCP increased his insulin. He is not  currently on any specific diet. Patient stated he does try to watch his sugar, and carb intake but it's hard around the holidays.   Do you get any type of exercise on a regular basis?  Patient stated he hasn't done much exercising in the last 2 months as he has been dealing with cellulitis on his legs. Patient reports just completing a course of ABX, and stated so far it appears that the cellulitis on his legs has cleared up. He is hoping to start back walking for about 20 minutes a week.   Can you think of a goal you would like to reach for your health? To get his A1C down before his next PCP visit  Do you have any problems getting your medications? No issues with getting the medications, just has issues with affording the medications.   Please bring medications and supplements to appointment  Patient Assistance application started for the patient today for Trelegy through [employer]. There is a requirement through [employer] for patient to spend a total of $600.00 out-of-pocket per calender year. The patient I believe has done this, but I don't know if the application will get processed in time to assist with his issue.   [name] is an inhaler that does not require this stipulation, and would be easier to obtain. Also, that patient is needing assistance with his Xarelto which the company for [place] [employer] isn't taking patient assistance applications until April and there will still be a cost for this medication. A 30-Day supply will be 85.00 and a 90-Day supply will be 240.00. If patient switches to Eliquis there is an out-of-pocket requirement prior to receiving patient assistance for Eliquis as well.  The best outcome for this patient would be an LIS application to see if he can get approved for Low Income Subsidy through [employer], but this process does take a few weeks. CPP has all options available and will speak to patient regarding the best one.    PAP Application e-mailed to CPP.  Lynann Bologna,  CPA/CMA Clinical Pharmacist Assistant Phone: [phone]

## 2021-07-20 ENCOUNTER — Ambulatory Visit (INDEPENDENT_AMBULATORY_CARE_PROVIDER_SITE_OTHER): Payer: PPO

## 2021-07-20 DIAGNOSIS — E1151 Type 2 diabetes mellitus with diabetic peripheral angiopathy without gangrene: Secondary | ICD-10-CM

## 2021-07-20 DIAGNOSIS — J449 Chronic obstructive pulmonary disease, unspecified: Secondary | ICD-10-CM

## 2021-07-20 DIAGNOSIS — Z720 Tobacco use: Secondary | ICD-10-CM

## 2021-07-20 DIAGNOSIS — I4819 Other persistent atrial fibrillation: Secondary | ICD-10-CM

## 2021-07-20 DIAGNOSIS — I10 Essential (primary) hypertension: Secondary | ICD-10-CM

## 2021-07-20 DIAGNOSIS — E785 Hyperlipidemia, unspecified: Secondary | ICD-10-CM

## 2021-07-20 MED ORDER — FREESTYLE LIBRE 2 READER DEVI: 0 refills | Status: DC

## 2021-07-20 MED ORDER — FREESTYLE LIBRE 2 SENSOR MISC: 11 refills | Status: DC

## 2021-07-20 NOTE — Progress Notes (Signed)
Chronic Care Management Pharmacy Note  07/25/2021 Name:  Mark Nash MRN:  [id number] DOB:  1956-08-05  Summary: Patient presents for initial CCM consult. He has been unable to afford Trelegy or Xarelto. Has not been monitoring blood sugars due to formulary/cost concerns.   Recommendations/Changes made from today's visit: Recommend rechecking vitamin D level Patient will start on Freestyle Libre 2 Device  Plan: CPP follow-up in 2 weeks to educate on freestyle libre use.  Subjective: Mark Nash is an 65 y.o. year old male who is a primary patient of [name], MD.  The CCM team was consulted for assistance with disease management and care coordination needs.    Engaged with patient by telephone for initial visit in response to provider referral for pharmacy case management and/or care coordination services.   Consent to Services:  The patient was given the following information about Chronic Care Management services today, agreed to services, and gave verbal consent: 1. CCM service includes personalized support from designated clinical staff supervised by the primary care provider, including individualized plan of care and coordination with other care providers 2. 24/7 contact phone numbers for assistance for urgent and routine care needs. 3. Service will only be billed when office clinical staff spend 20 minutes or more in a month to coordinate care. 4. Only one practitioner may furnish and bill the service in a calendar month. 5.The patient may stop CCM services at any time (effective at the end of the month) by phone call to the office staff. 6. The patient will be responsible for cost sharing (co-pay) of up to 20% of the service fee (after annual deductible is met). Patient agreed to services and consent obtained.  Patient Care Team: [name], MD as PCP - General (Family Medicine) Vickie Epley, MD as PCP - Electrophysiology (Cardiology) Virgel Manifold,  MD as Consulting Physician (Gastroenterology) Leim Fabry, MD as Consulting Physician (Orthopedic Surgery) Caroline More, DPM as Consulting Physician (Podiatry) Nathanial Rancher, MD as Referring Physician (Pain Medicine) End, Harrell Gave, MD as Consulting Physician (Cardiology) Germaine Pomfret, [facility] (Pharmacist)  Recent office visits: 07/13/2021 [name], MD (PCP Office Visit) for Follow-up- Stopped: Doxycycline Hyclate, Metoprolol Tartrate due to patient not taking, Lab order placed, patient instructed to return in 4 months   06/29/2021 [name], MD (PCP Office Visit) for Cellulitis-  Start: Doxycycline Hyclate 100 mg twice daily, Levofloxacin 500 mg daily, no follow-up noted   06/10/2021 [name], MD (PCP Office Visit) for Swollen Calf- No medication changes noted, US Venous lmg Lower Unilateral Right (DVT) ordered, No follow-up noted   04/28/2021 [name], MD (PCP Office Visit) for Hospital Discharge follow-up- No medication changes noted, lab order placed, no follow-up noted, patient instructed to return in 4 weeks   03/16/2021 [name], MD (PCP Office Visit) for Follow-Up- Start: Rivaroxaban 20 mg daily, Changed: Gabapentin 300 mg cap 1-2 capsules 3 times daily, Insulin Detemir 6 units Subcutaneous Daily at bedtime  Recent consult visits: 06/17/2021 [name], DPM (Podiatry) for Diabetes- No medication changes noted, no orders placed, patient instructed to return in 3 months   05/25/2021 [name], MD (Cardiology) for Persistent AFIB- No medication changes noted, lab order placed, CT Cardiac Morph/PULM Vein/W/CM&W/O, patient instructed to follow-up in 4 weeks   05/06/2021 [name], MD (Cardiology) for Initial Consult- Changed: Erogocalciferol 50,000 Units Oral, Every other week, Gabapentin 300 mg Take one capsule in the morning, one at noon, and 2 capsule in the evening, NM Myocar  Multi W/Spect W/Wall Motion, EKG 12-Lead, Cardiac  Stress Test   03/08/2021 [name], MD (Rheumatology) for Arthritis- No medication changes noted, lab order placed, patient instructed to follow-up in 1 year  Hospital visits: Admitted to the hospital on 06/09/2021  due to Cardiology. Discharge date was 06/09/2021. Discharged from [place]?Medications Started at [facility] Discharge:?? -START taking: sulfamethoxazole-trimethoprim (BACTRIM DS) 800-160 MG tablet Take 1 tablet by mouth 2 (two) times daily for 7 days.   Medication Changes at Hospital Discharge: -Changed None ID    Medications Discontinued at Hospital Discharge: -STOP taking: None ID   Medications that remain the same after Hospital Discharge:??  -All other medications will remain the same.     Medication Reconciliation was completed by comparing discharge summary, patient's EMR and Pharmacy list, and upon discussion with patient.   Admitted to the hospital on 05/20/2021  due to Cardioversion. Discharge date was 05/20/2021. Discharged from [place]?Medications Started at [facility] Discharge:?? -START taking: None ID   Medication Changes at Hospital Discharge: -Changed None ID    Medications Discontinued at Hospital Discharge: -STOP taking: None ID   Medications that remain the same after Hospital Discharge:??  -All other medications will remain the same.     Medication Reconciliation was completed by comparing discharge summary, patient's EMR and Pharmacy list, and upon discussion with patient.   Admitted to the hospital on 04/19/2021  due to COPD, HTN, , DM II, Rapid AFIB, AKI,. Discharge date was 04/22/2021. Discharged from [place]?Medications Started at [facility] Discharge:?? -START taking: diltiazem (DILACOR XR) 240 MG 24 hr capsule Take 1 capsule (240 mg total) by mouth daily.   Medication Changes at Hospital Discharge: -Changed None ID     Medications Discontinued at Hospital Discharge: -STOP taking: lisinopril-hydrochlorothiazide 20-12.5 MG tablet (ZESTORETIC) meloxicam 7.5 MG tablet (MOBIC)   Medications that remain the same after Hospital Discharge:??  -All other medications will remain the same.     Objective:  Lab Results  Component Value Date   CREATININE 0.77 05/25/2021   BUN 11 05/25/2021   GFRNONAA >60 04/21/2021   GFRAA 116 09/15/2020   NA 140 05/25/2021   K 4.3 05/25/2021   CALCIUM 8.8 05/25/2021   CO2 25 05/25/2021   GLUCOSE 143 (H) 05/25/2021    Lab Results  Component Value Date/Time   HGBA1C 7.6 (A) 07/13/2021 01:25 PM   HGBA1C 7.3 (A) 03/16/2021 11:14 AM   HGBA1C 7.1 (H) 11/10/2019 02:27 PM   HGBA1C 6.6 07/11/2019 01:45 PM   HGBA1C 6.8 03/10/2019 10:08 AM   MICROALBUR 0.2 09/15/2020 03:00 PM   MICROALBUR 0.3 03/10/2019 10:08 AM   MICROALBUR 50 07/17/2017 02:17 PM   MICROALBUR negative 03/11/2015 10:59 AM    Last diabetic Eye exam: No results found for: HMDIABEYEEXA  Last diabetic Foot exam:  Lab Results  Component Value Date/Time   HMDIABFOOTEX Normal 02/11/2018 12:00 AM     Lab Results  Component Value Date   CHOL 121 09/15/2020   HDL 51 09/15/2020   LDLCALC 54 09/15/2020   TRIG 76 09/15/2020   CHOLHDL 2.4 09/15/2020    Hepatic Function Latest Ref Rng & Units 09/15/2020 11/10/2019 03/10/2019  Total Protein 6.1 - 8.1 g/dL 6.5 6.3 6.4  Albumin 3.6 - 5.1 g/dL - - -  AST 10 - 35 U/L '17 16 12  ' ALT 9 - 46 U/L 14 13 11  Alk Phosphatase 40 - 115 U/L - - -  Total Bilirubin 0.2 - 1.2 mg/dL 1.2 0.9 0.9  Bilirubin, Direct <=0.2 mg/dL - - -    Lab Results  Component Value Date/Time   TSH 1.706 04/19/2021 10:04 PM    CBC Latest Ref Rng & Units 05/25/2021 04/22/2021 04/21/2021  WBC 3.4 - 10.8 x10E3/uL 6.7 5.3 5.2  Hemoglobin 13.0 - 17.7 g/dL 15.2 15.9 16.3  Hematocrit 37.5 - 51.0 % 43.3 44.3 44.5  Platelets 150 - 450 x10E3/uL 167 132(L) 137(L)    Lab Results  Component Value  Date/Time   VD25OH 26 (L) 03/30/2017 12:04 PM   VD25OH 26 (L) 12/15/2016 02:05 PM    Clinical ASCVD: No  The ASCVD Risk score (Arnett DK, et al., 2019) failed to calculate for the following reasons:   The valid total cholesterol range is 130 to 320 mg/dL    Depression screen [facility] 2/9 07/13/2021 06/29/2021 06/10/2021  Decreased Interest 0 0 0  Down, Depressed, Hopeless 0 0 0  PHQ - 2 Score 0 0 0  Altered sleeping 0 0 -  Tired, decreased energy 0 0 -  Change in appetite 0 0 -  Feeling bad or failure about yourself  0 0 -  Trouble concentrating 0 0 -  Moving slowly or fidgety/restless 0 0 -  Suicidal thoughts 0 0 -  PHQ-9 Score 0 0 -  Difficult doing work/chores - - -  Some recent data might be hidden    Social History   Tobacco Use  Smoking Status Every Day   Packs/day: 1.50   Years: 46.00   Pack years: 69.00   Types: Cigarettes  Smokeless Tobacco Never   BP Readings from Last 3 Encounters:  07/13/21 130/72  06/29/21 122/76  06/10/21 120/82   Pulse Readings from Last 3 Encounters:  07/13/21 89  06/29/21 63  06/10/21 85   Wt Readings from Last 3 Encounters:  07/13/21 172 lb (78 kg)  06/29/21 170 lb (77.1 kg)  06/10/21 166 lb (75.3 kg)   BMI Readings from Last 3 Encounters:  07/13/21 23.99 kg/m  06/29/21 23.71 kg/m  06/10/21 23.15 kg/m    Assessment/Interventions: Review of patient past medical history, allergies, medications, health status, including review of consultants reports, laboratory and other test data, was performed as part of comprehensive evaluation and provision of chronic care management services.   SDOH:  (Social Determinants of Health) assessments and interventions performed: Yes SDOH Interventions    Flowsheet Row Most Recent Value  SDOH Interventions   Financial Strain Interventions Other (Comment)  [PAP]      SDOH Screenings   Alcohol Screen: Low Risk    Last Alcohol Screening Score (AUDIT): 0  Depression (PHQ2-9): Low Risk     PHQ-2 Score: 0  Financial Resource Strain: High Risk   Difficulty of Paying Living Expenses: Very hard  Food Insecurity: No Food Insecurity   Worried About Charity fundraiser in the Last Year: Never true   Ran Out of Food in the Last Year: Never true  Housing: Low Risk    Last Housing Risk Score: 0  Physical Activity: Insufficiently Active   Days of Exercise per Week: 2 days   Minutes of Exercise per Session: 20 min  Social Connections: Socially Isolated   Frequency of Communication with Friends and Family: More than three times a week   Frequency of Social Gatherings with Friends and Family: Three times a week   Attends Religious Services: Never  Active Member of Clubs or Organizations: No   Attends Archivist Meetings: Never   Marital Status: Divorced  Stress: No Stress Concern Present   Feeling of Stress : Not at all  Tobacco Use: High Risk   Smoking Tobacco Use: Every Day   Smokeless Tobacco Use: Never   Passive Exposure: Not on file  Transportation Needs: No Transportation Needs   Lack of Transportation (Medical): No   Lack of Transportation (Non-Medical): No    CCM Care Plan  Allergies  Allergen Reactions   Penicillins Hives and Swelling    As a child. Has patient had a PCN reaction causing immediate rash, facial/tongue/throat swelling, SOB or lightheadedness with hypotension: YES  Has patient had a PCN reaction causing severe rash involving mucus membranes or skin necrosis: UNKNOWN Has patient had a PCN reaction that required hospitalization: UNKNOWN Has patient had a PCN reaction occurring within the last 10 years: NO   Aspirin Nausea Only   Ibuprofen Nausea Only   Lyrica [Pregabalin] Nausea Only   Acetaminophen Nausea Only    Medications Reviewed Today     Reviewed by Germaine Pomfret, [facility] (Pharmacist) on 07/20/21 at [place] List Status: <None>   Medication Order Taking? Sig Documenting Provider Last Dose Status Informant  diltiazem (DILACOR  XR) 240 MG 24 hr capsule [id number] Yes Take 1 capsule (240 mg total) by mouth daily. End, Harrell Gave, MD Taking Active Self  ergocalciferol (VITAMIN D2) 1.25 MG (50000 UT) capsule [id number] Yes Take 1 capsule (50,000 Units total) by mouth every 14 (fourteen) days. [name], MD Taking Active   Fluticasone-Umeclidin-[facility] ELLIPTA) 100-62.5-25 MCG/INH AEPB [id number] Yes Take 1 puff by mouth every evening.  Patient taking differently: Take 1 puff by mouth daily as needed (respiratory issues.).   [name], MD Taking Active Self  gabapentin (NEURONTIN) 300 MG capsule [id number] Yes Take 300-600 mg by mouth See admin instructions. Take 1 capsule (300 mg) by mouth in the morning, take 1 capsule (300 mg) by mouth at noon, and 2 capsules (600 mg) by mouth in the evening [provider] Taking Active Self  insulin detemir (LEVEMIR) 100 UNIT/ML injection [id number] Yes Inject 0.06 mLs (6 Units total) into the skin at bedtime. [name], MD Taking Active Self           Med Note Daron Offer A   Wed Jul 20, 2021  2:03 PM) 7 units  pravastatin (PRAVACHOL) 20 MG tablet [id number] Yes Take 1 tablet (20 mg total) by mouth daily with supper. [name], MD Taking Active   rivaroxaban (XARELTO) 20 MG TABS tablet [id number] Yes Take 1 tablet (20 mg total) by mouth daily with supper. Vickie Epley, MD Taking Active   tiZANidine (ZANAFLEX) 2 MG tablet [id number] Yes Take 2 mg by mouth every 12 (twelve) hours. Allyne Gee, MD Taking Active Self  traMADol Veatrice [place]bon) 50 MG tablet [id number] Yes Take 50 mg by mouth every 12 (twelve) hours. Allyne Gee, MD Taking Active Self  Med List Note Al Decant, RN 10/03/18 1438): UDS 10/03/18            Patient Active Problem List   Diagnosis Date Noted   Persistent atrial fibrillation ([place]) 05/07/2021   Diabetes mellitus type 2 with peripheral artery disease ([place]) 03/16/2021   DDD (degenerative disc disease), lumbar  03/06/2019   Primary osteoarthritis involving multiple joints 03/06/2019   Lumbar radiculopathy 11/05/2018   Lumbar post-laminectomy syndrome 11/05/2018   Diabetic polyneuropathy associated with  type 2 diabetes mellitus ([place]) 11/05/2018   CLE (columnar lined esophagus)    Gastric irritation    Duodenum ulcer    Personal history of colonic polyps    Polyp of sigmoid colon    Diverticulosis of large intestine without diverticulitis    Benign neoplasm of ascending colon    Osteoarthritis of both hands 09/04/2017   Chronic pain of left knee 09/04/2017   Lymphedema 07/07/2016   Chronic hepatitis C without hepatic coma ([place]) 06/01/2016   Lumbar herniated disc 11/24/2015   Thrombocytopenia (HCC) 11/11/2015   Hepatitis C antibody test positive 07/13/2015   Chronic constipation 07/13/2015   CN (constipation) 07/13/2015   Diabetes mellitus with coincident hypertension ([place]) 03/08/2015   Dyslipidemia 03/08/2015   Paranoid schizophrenia ([place]) 03/08/2015   Restless leg 03/08/2015   Callus of foot 03/08/2015   Claudication ([place]) 03/08/2015   Chronic obstructive pulmonary disease ([place]) 03/08/2015   Corn or callus 03/08/2015   Essential (primary) hypertension 03/08/2015   HLD (hyperlipidemia) 03/08/2015   Peripheral vascular disease ([place]) 03/08/2015   Polyneuropathy 03/08/2015   Current tobacco use 03/08/2015   Vitamin D deficiency 04/26/2009   Avitaminosis D 04/26/2009    Immunization History  Administered Date(s) Administered   Fluad Quad(high Dose 65+) 04/28/2021   Influenza Inj Mdck Quad Pf 07/11/2019   Influenza, High Dose Seasonal PF 06/30/2016   Influenza,inj,Quad PF,6+ Mos 07/17/2017, 05/29/2018   Influenza-Unspecified 06/21/2020   PFIZER(Purple Top)SARS-COV-2 Vaccination 11/13/2019, 12/09/2019, 08/04/2020   Pneumococcal Conjugate-13 07/13/2015   Pneumococcal Polysaccharide-23 12/23/2009, 03/29/2020   Tdap 12/23/2009, 03/29/2020    Conditions to be addressed/monitored:   Hypertension, Hyperlipidemia, Diabetes, Atrial Fibrillation, COPD, and Tobacco use  Care Plan : General Pharmacy (Adult)  Updates made by Germaine Pomfret, [place] since 07/25/2021 12:00 AM     Problem: Hypertension, Hyperlipidemia, Diabetes, Atrial Fibrillation, COPD, and Tobacco use   Priority: High     Long-Range Goal: Patient-Specific Goal   Start Date: 07/25/2021  Expected End Date: 07/25/2022  This Visit's Progress: On track  Priority: High  Note:   Current Barriers:  Unable to independently afford treatment regimen  Pharmacist Clinical Goal(s):  Patient will verbalize ability to afford treatment regimen achieve control of diabetes as evidenced by A1c less than 7% through collaboration with PharmD and provider.   Interventions: 1:1 collaboration with [name], MD regarding development and update of comprehensive plan of care as evidenced by provider attestation and co-signature Inter-disciplinary care team collaboration (see longitudinal plan of care) Comprehensive medication review performed; medication list updated in electronic medical record  Hypertension (BP goal <140/90) -Controlled -Current treatment: Diltiazem XR 240 mg daily -Medications previously tried: NA  -Current home readings: NA -Denies hypotensive/hypertensive symptoms -Recommended to continue current medication  Atrial Fibrillation (Goal: prevent stroke and major bleeding) -Controlled -CHADSVASC: 4 -Current treatment: Rate control: Diltiazem XR 240 mg daily Anticoagulation: Xarelto 20 mg daily -Medications previously tried: NA -Patient receiving Xarelto through [employer]. -May need to consider switching to Eliquis in 2023 due to easier financial assistance.  -Recommended to continue current medication  Hyperlipidemia: (LDL goal < 70) -Controlled -Current treatment: Pravastatin 20 mg daily -Medications previously tried: NA  -Recommended to continue current medication  Diabetes  (A1c goal <7%) -Uncontrolled Diagnosed 20+ years ago -Current medications: Levemir 7 units nightly  -Medications previously tried: Catering manager, Januvia, Metformin,   -Current home glucose readings fasting glucose: Not monitoring due to cost.  -Reports hypoglycemic/hyperglycemic symptoms -Current meal patterns: Eating more fruit.  -New Rx for [employer]  sensor sent to pharmacy as covered 100% under patient insurance.  -Recommended to continue current medication  COPD (Goal: control symptoms and prevent exacerbations) -Controlled -Current treatment  Trelegy 1 puff daily  -Medications previously tried: NA  -Exacerbations requiring treatment in last 6 months: Yes, hospitalized -Patient reports consistent use of maintenance inhaler.  -Frequency of rescue inhaler use: None -Start PAP for Trelegy  -Will consider switching to [facility] for financial assistance in 2023.  -Recommended to continue current medication  Tobacco use (Goal Quit smoking) -Controlled -Previous quit attempts: None -Current treatment  1.5 ppd  -Patient is in a pre-contemplative stage of action and is not ready to quit.  -Provided contact information for [place] Quit Line (1-800-QUIT-NOW) and encouraged patient to reach out to this group for support.  Chronic Pain (Goal: Minimize pain symptoms) -Controlled -Managed by Dr. Humphrey Rolls  -Current treatment  Gabapentin 300 1 cap AM, 1 cap Noon, 2 caps PM Tizanidine 2 mg twice daily  Tramadol 50 mg twice daily  -Medications previously tried: NA  -Recommended to continue current medication  Patient Goals/Self-Care Activities Patient will:  - check glucose AT LEAST 5 times daily via CGM, document, and provide at future appointments  Follow Up Plan: Face to Face appointment with care management team member scheduled for: 08/03/2021 at 1:45 PM      Medication Assistance: Application for Trelegy  medication assistance program. in process.  Anticipated assistance start date  TBD.  See plan of care for additional detail.  Compliance/Adherence/Medication fill history: Care Gaps: Diabetic Eye Exam Zoster Vaccines Colonoscopy COVID-19 Vaccine  [facility]: Pravastatin 20 mg last filled on 04/06/2021 for a 90-Day supply with [facility]  Patient's preferred pharmacy is:  [facility] [address], [place] - [place] [address] Phone: [phone] Fax: [place], [place] [address] [address] Phone: [phone] Fax: [place] [phone], [place] - [place] [address] Phone: [phone] Fax: [phone]  [facility] [id number] - [place] [address] [place] [address] Phone: [phone] Fax: [phone]  Uses pill box? Yes Pt endorses 100% compliance  We discussed: Current pharmacy is preferred with insurance plan and patient is satisfied with pharmacy services Patient decided to: Continue current medication management strategy  Care Plan and Follow Up Patient Decision:  Patient agrees to Care Plan and Follow-up.  Plan: Face to Face appointment with care management team member scheduled for: 08/03/2021 at 1:45 PM  Malva Limes, [place] Pharmacist Practitioner  [facility] [phone]

## 2021-07-25 NOTE — Patient Instructions (Signed)
Visit Information It was great speaking with you today!  Please let me know if you have any questions about our visit.   Goals Addressed             This Visit's Progress    Monitor and Manage My Blood Sugar-Diabetes Type 2       Timeframe:  Long-Range Goal Priority:  High Start Date: 07/25/2021                            Expected End Date: 07/25/2022                      Follow Up within 90 days   -check blood sugar at least 5 times throughout the day - check blood sugar before and after exercise - check blood sugar if I feel it is too high or too low - take the blood sugar meter to all doctor visits    Why is this important?   Checking your blood sugar at home helps to keep it from getting very high or very low.  Writing the results in a diary or log helps the doctor know how to care for you.  Your blood sugar log should have the time, date and the results.  Also, write down the amount of insulin or other medicine that you take.  Other information, like what you ate, exercise done and how you were feeling, will also be helpful.     Notes:      Quit Smoking   Not on track    If you wish to quit smoking, help is available. For free tobacco cessation program offerings call the [facility] at (872)217-5422 or Live Well Line at (606)117-9370. You may also visit [email or web address] or email [email or web address] for more information on other programs.          Patient Care Plan: General Pharmacy (Adult)     Problem Identified: Hypertension, Hyperlipidemia, Diabetes, Atrial Fibrillation, COPD, and Tobacco use   Priority: High     Long-Range Goal: Patient-Specific Goal   Start Date: 07/25/2021  Expected End Date: 07/25/2022  This Visit's Progress: On track  Priority: High  Note:   Current Barriers:  Unable to independently afford treatment regimen  Pharmacist Clinical Goal(s):  Patient will verbalize ability to afford treatment regimen achieve  control of diabetes as evidenced by A1c less than 7% through collaboration with PharmD and provider.   Interventions: 1:1 collaboration with [name], MD regarding development and update of comprehensive plan of care as evidenced by provider attestation and co-signature Inter-disciplinary care team collaboration (see longitudinal plan of care) Comprehensive medication review performed; medication list updated in electronic medical record  Hypertension (BP goal <140/90) -Controlled -Current treatment: Diltiazem XR 240 mg daily -Medications previously tried: NA  -Current home readings: NA -Denies hypotensive/hypertensive symptoms -Recommended to continue current medication  Atrial Fibrillation (Goal: prevent stroke and major bleeding) -Controlled -CHADSVASC: 4 -Current treatment: Rate control: Diltiazem XR 240 mg daily Anticoagulation: Xarelto 20 mg daily -Medications previously tried: NA -Patient receiving Xarelto through [employer]. -May need to consider switching to Eliquis in 2023 due to easier financial assistance.  -Recommended to continue current medication  Hyperlipidemia: (LDL goal < 70) -Controlled -Current treatment: Pravastatin 20 mg daily -Medications previously tried: NA  -Recommended to continue current medication  Diabetes (A1c goal <7%) -Uncontrolled Diagnosed 20+ years ago -Current medications: Levemir 7 units nightly  -Medications  previously tried: Catering manager, Januvia, Metformin,   -Current home glucose readings fasting glucose: Not monitoring due to cost.  -Reports hypoglycemic/hyperglycemic symptoms -Current meal patterns: Eating more fruit.  -New Rx for [facility] sensor sent to pharmacy as covered 100% under patient insurance.  -Recommended to continue current medication  COPD (Goal: control symptoms and prevent exacerbations) -Controlled -Current treatment  Trelegy 1 puff daily  -Medications previously tried: NA  -Exacerbations  requiring treatment in last 6 months: Yes, hospitalized -Patient reports consistent use of maintenance inhaler.  -Frequency of rescue inhaler use: None -Start PAP for Trelegy  -Will consider switching to [facility] for financial assistance in 2023.  -Recommended to continue current medication  Tobacco use (Goal Quit smoking) -Controlled -Previous quit attempts: None -Current treatment  1.5 ppd  -Patient is in a pre-contemplative stage of action and is not ready to quit.  -Provided contact information for [place] [facility]uit Line (1-800-QUIT-NOW) and encouraged patient to reach out to this group for support.  Chronic Pain (Goal: Minimize pain symptoms) -Controlled -Managed by Dr. Humphrey Rolls  -Current treatment  Gabapentin 300 1 cap AM, 1 cap Noon, 2 caps PM Tizanidine 2 mg twice daily  Tramadol 50 mg twice daily  -Medications previously tried: NA  -Recommended to continue current medication  Patient Goals/Self-Care Activities Patient will:  - check glucose AT LEAST 5 times daily via CGM, document, and provide at future appointments  Follow Up Plan: Face to Face appointment with care management team member scheduled for: 08/03/2021 at 1:45 PM    Mark Nash was given information about Chronic Care Management services today including:  CCM service includes personalized support from designated clinical staff supervised by his physician, including individualized plan of care and coordination with other care providers 24/7 contact phone numbers for assistance for urgent and routine care needs. Standard insurance, coinsurance, copays and deductibles apply for chronic care management only during months in which we provide at least 20 minutes of these services. Most insurances cover these services at 100%, however patients may be responsible for any copay, coinsurance and/or deductible if applicable. This service may help you avoid the need for more expensive face-to-face services. Only one practitioner may  furnish and bill the service in a calendar month. The patient may stop CCM services at any time (effective at the end of the month) by phone call to the office staff.  Patient agreed to services and verbal consent obtained.   The patient verbalized understanding of instructions, educational materials, and care plan provided today and declined offer to receive copy of patient instructions, educational materials, and care plan.   Mark Nash, [place] Pharmacist Practitioner  [facility] [phone]

## 2021-07-27 DIAGNOSIS — I10 Essential (primary) hypertension: Secondary | ICD-10-CM | POA: Diagnosis not present

## 2021-07-27 DIAGNOSIS — Z7901 Long term (current) use of anticoagulants: Secondary | ICD-10-CM

## 2021-07-27 DIAGNOSIS — F17210 Nicotine dependence, cigarettes, uncomplicated: Secondary | ICD-10-CM

## 2021-07-27 DIAGNOSIS — E1151 Type 2 diabetes mellitus with diabetic peripheral angiopathy without gangrene: Secondary | ICD-10-CM | POA: Diagnosis not present

## 2021-07-27 DIAGNOSIS — J449 Chronic obstructive pulmonary disease, unspecified: Secondary | ICD-10-CM | POA: Diagnosis not present

## 2021-07-27 DIAGNOSIS — E785 Hyperlipidemia, unspecified: Secondary | ICD-10-CM

## 2021-07-27 DIAGNOSIS — I4819 Other persistent atrial fibrillation: Secondary | ICD-10-CM

## 2021-07-27 DIAGNOSIS — Z794 Long term (current) use of insulin: Secondary | ICD-10-CM | POA: Diagnosis not present

## 2021-08-03 ENCOUNTER — Ambulatory Visit: Payer: PPO

## 2021-08-03 DIAGNOSIS — E1151 Type 2 diabetes mellitus with diabetic peripheral angiopathy without gangrene: Secondary | ICD-10-CM

## 2021-08-03 DIAGNOSIS — J449 Chronic obstructive pulmonary disease, unspecified: Secondary | ICD-10-CM

## 2021-08-03 DIAGNOSIS — I4819 Other persistent atrial fibrillation: Secondary | ICD-10-CM

## 2021-08-03 MED ORDER — TRELEGY ELLIPTA 100-62.5-25 MCG/ACT IN AEPB: 1.0000 | INHALATION_SPRAY | Freq: Every day | RESPIRATORY_TRACT | 11 refills | Status: DC

## 2021-08-03 MED ORDER — TRELEGY ELLIPTA 100-62.5-25 MCG/ACT IN AEPB: 1.0000 | INHALATION_SPRAY | Freq: Every day | RESPIRATORY_TRACT | 3 refills | Status: DC

## 2021-08-03 NOTE — Patient Instructions (Signed)
Visit Information It was great speaking with you today!  Please let me know if you have any questions about our visit.  Plan:  Start checking your blood sugars with the Freestyle Libre AT LEAST 4-5 times daily   Print copy of patient instructions, educational materials, and care plan provided in person. Face to Face appointment with pharmacist scheduled for:  09/28/2020 at 3:45 PM  Malva Limes, [place] Pharmacist Practitioner  [facility] [phone]

## 2021-08-03 NOTE — Progress Notes (Signed)
Chronic Care Management Pharmacy Note  08/04/2021 Name:  OSHUA MCCONAHA MRN:  [id number] DOB:  [birth year]  Summary: Patient presents for CCM follow-up. Extensive counseling given on use of Freestyle Libre system.   Recommendations/Changes made from today's visit: Recommend rechecking vitamin D level [place] Patient Assistance form for Trelegy completed by patient and faxed for review  Continue current medications  Plan: CPP follow-up in 2 months  Subjective: BOND GRIESHOP is an 65 y.o. year old male who is a primary patient of [name], MD.  The CCM team was consulted for assistance with disease management and care coordination needs.    Engaged with patient by telephone for initial visit in response to provider referral for pharmacy case management and/or care coordination services.   Consent to Services:  The patient was given the following information about Chronic Care Management services today, agreed to services, and gave verbal consent: 1. CCM service includes personalized support from designated clinical staff supervised by the primary care provider, including individualized plan of care and coordination with other care providers 2. 24/7 contact phone numbers for assistance for urgent and routine care needs. 3. Service will only be billed when office clinical staff spend 20 minutes or more in a month to coordinate care. 4. Only one practitioner may furnish and bill the service in a calendar month. 5.The patient may stop CCM services at any time (effective at the end of the month) by phone call to the office staff. 6. The patient will be responsible for cost sharing (co-pay) of up to 20% of the service fee (after annual deductible is met). Patient agreed to services and consent obtained.  Patient Care Team: [name], MD as PCP - General (Family Medicine) Vickie Epley, MD as PCP - Electrophysiology (Cardiology) Virgel Manifold, MD as Consulting Physician  (Gastroenterology) Leim Fabry, MD as Consulting Physician (Orthopedic Surgery) Caroline More, DPM as Consulting Physician (Podiatry) Nathanial Rancher, MD as Referring Physician (Pain Medicine) End, Harrell Gave, MD as Consulting Physician (Cardiology) Germaine Pomfret, [facility] (Pharmacist)  Recent office visits: 07/13/2021 [name], MD (PCP Office Visit) for Follow-up- Stopped: Doxycycline Hyclate, Metoprolol Tartrate due to patient not taking, Lab order placed, patient instructed to return in 4 months   06/29/2021 [name], MD (PCP Office Visit) for Cellulitis-  Start: Doxycycline Hyclate 100 mg twice daily, Levofloxacin 500 mg daily, no follow-up noted   06/10/2021 [name], MD (PCP Office Visit) for Swollen Calf- No medication changes noted, US Venous lmg Lower Unilateral Right (DVT) ordered, No follow-up noted   04/28/2021 [name], MD (PCP Office Visit) for Hospital Discharge follow-up- No medication changes noted, lab order placed, no follow-up noted, patient instructed to return in 4 weeks   03/16/2021 [name], MD (PCP Office Visit) for Follow-Up- Start: Rivaroxaban 20 mg daily, Changed: Gabapentin 300 mg cap 1-2 capsules 3 times daily, Insulin Detemir 6 units Subcutaneous Daily at bedtime  Recent consult visits: 06/17/2021 [name], DPM (Podiatry) for Diabetes- No medication changes noted, no orders placed, patient instructed to return in 3 months   05/25/2021 [name], MD (Cardiology) for Persistent AFIB- No medication changes noted, lab order placed, CT Cardiac Morph/PULM Vein/W/CM&W/O, patient instructed to follow-up in 4 weeks   05/06/2021 [name], MD (Cardiology) for Initial Consult- Changed: Erogocalciferol 50,000 Units Oral, Every other week, Gabapentin 300 mg Take one capsule in the morning, one at noon, and 2 capsule in the evening, NM Myocar Multi W/Spect W/Wall Motion, EKG 12-Lead, Cardiac Stress  Test   03/08/2021 Mayur  Stefan Church, MD (Rheumatology) for Arthritis- No medication changes noted, lab order placed, patient instructed to follow-up in 1 year  Hospital visits: Admitted to the hospital on 06/09/2021  due to Cardiology. Discharge date was 06/09/2021. Discharged from [place]?Medications Started at [facility] Discharge:?? -START taking: sulfamethoxazole-trimethoprim (BACTRIM DS) 800-160 MG tablet Take 1 tablet by mouth 2 (two) times daily for 7 days.   Medication Changes at Hospital Discharge: -Changed None ID    Medications Discontinued at Hospital Discharge: -STOP taking: None ID   Medications that remain the same after Hospital Discharge:??  -All other medications will remain the same.     Medication Reconciliation was completed by comparing discharge summary, patient's EMR and Pharmacy list, and upon discussion with patient.   Admitted to the hospital on 05/20/2021  due to Cardioversion. Discharge date was 05/20/2021. Discharged from [place]?Medications Started at [facility] Discharge:?? -START taking: None ID   Medication Changes at Hospital Discharge: -Changed None ID    Medications Discontinued at Hospital Discharge: -STOP taking: None ID   Medications that remain the same after Hospital Discharge:??  -All other medications will remain the same.     Medication Reconciliation was completed by comparing discharge summary, patient's EMR and Pharmacy list, and upon discussion with patient.   Admitted to the hospital on 04/19/2021  due to COPD, HTN, , DM II, Rapid AFIB, AKI,. Discharge date was 04/22/2021. Discharged from [place]?Medications Started at [facility] Discharge:?? -START taking: diltiazem (DILACOR XR) 240 MG 24 hr capsule Take 1 capsule (240 mg total) by mouth daily.   Medication Changes at Hospital Discharge: -Changed None ID    Medications Discontinued at  Hospital Discharge: -STOP taking: lisinopril-hydrochlorothiazide 20-12.5 MG tablet (ZESTORETIC) meloxicam 7.5 MG tablet (MOBIC)   Medications that remain the same after Hospital Discharge:??  -All other medications will remain the same.     Objective:  Lab Results  Component Value Date   CREATININE 0.77 05/25/2021   BUN 11 05/25/2021   GFRNONAA >60 04/21/2021   GFRAA 116 09/15/2020   NA 140 05/25/2021   K 4.3 05/25/2021   CALCIUM 8.8 05/25/2021   CO2 25 05/25/2021   GLUCOSE 143 (H) 05/25/2021    Lab Results  Component Value Date/Time   HGBA1C 7.6 (A) 07/13/2021 01:25 PM   HGBA1C 7.3 (A) 03/16/2021 11:14 AM   HGBA1C 7.1 (H) 11/10/2019 02:27 PM   HGBA1C 6.6 07/11/2019 01:45 PM   HGBA1C 6.8 03/10/2019 10:08 AM   MICROALBUR 0.2 09/15/2020 03:00 PM   MICROALBUR 0.3 03/10/2019 10:08 AM   MICROALBUR 50 07/17/2017 02:17 PM   MICROALBUR negative 03/11/2015 10:59 AM    Last diabetic Eye exam: No results found for: HMDIABEYEEXA  Last diabetic Foot exam:  Lab Results  Component Value Date/Time   HMDIABFOOTEX Normal 02/11/2018 12:00 AM     Lab Results  Component Value Date   CHOL 121 09/15/2020   HDL 51 09/15/2020   LDLCALC 54 09/15/2020   TRIG 76 09/15/2020   CHOLHDL 2.4 09/15/2020    Hepatic Function Latest Ref Rng & Units 09/15/2020 11/10/2019 03/10/2019  Total Protein 6.1 - 8.1 g/dL 6.5 6.3 6.4  Albumin 3.6 - 5.1 g/dL - - -  AST 10 - 35 U/L '17 16 12  ' ALT 9 - 46 U/L '14 13 11  ' Alk Phosphatase 40 - 115 U/L - - -  Total Bilirubin 0.2 - 1.2 mg/dL 1.2 0.9 0.9  Bilirubin, Direct <=0.2 mg/dL - - -    Lab Results  Component Value Date/Time   TSH 1.706 04/19/2021 10:04 PM    CBC Latest Ref Rng & Units 05/25/2021 04/22/2021 04/21/2021  WBC 3.4 - 10.8 x10E3/uL 6.7 5.3 5.2  Hemoglobin 13.0 - 17.7 g/dL 15.2 15.9 16.3  Hematocrit 37.5 - 51.0 % 43.3 44.3 44.5  Platelets 150 - 450 x10E3/uL 167 132(L) 137(L)    Lab Results  Component Value Date/Time   VD25OH 26 (L)  03/30/2017 12:04 PM   VD25OH 26 (L) 12/15/2016 02:05 PM    Clinical ASCVD: No  The ASCVD Risk score (Arnett DK, et al., 2019) failed to calculate for the following reasons:   The valid total cholesterol range is 130 to 320 mg/dL    Depression screen [facility] 2/9 07/13/2021 06/29/2021 06/10/2021  Decreased Interest 0 0 0  Down, Depressed, Hopeless 0 0 0  PHQ - 2 Score 0 0 0  Altered sleeping 0 0 -  Tired, decreased energy 0 0 -  Change in appetite 0 0 -  Feeling bad or failure about yourself  0 0 -  Trouble concentrating 0 0 -  Moving slowly or fidgety/restless 0 0 -  Suicidal thoughts 0 0 -  PHQ-9 Score 0 0 -  Difficult doing work/chores - - -  Some recent data might be hidden    Social History   Tobacco Use  Smoking Status Every Day   Packs/day: 1.50   Years: 46.00   Pack years: 69.00   Types: Cigarettes  Smokeless Tobacco Never   BP Readings from Last 3 Encounters:  07/13/21 130/72  06/29/21 122/76  06/10/21 120/82   Pulse Readings from Last 3 Encounters:  07/13/21 89  06/29/21 63  06/10/21 85   Wt Readings from Last 3 Encounters:  07/13/21 172 lb (78 kg)  06/29/21 170 lb (77.1 kg)  06/10/21 166 lb (75.3 kg)   BMI Readings from Last 3 Encounters:  07/13/21 23.99 kg/m  06/29/21 23.71 kg/m  06/10/21 23.15 kg/m    Assessment/Interventions: Review of patient past medical history, allergies, medications, health status, including review of consultants reports, laboratory and other test data, was performed as part of comprehensive evaluation and provision of chronic care management services.   SDOH:  (Social Determinants of Health) assessments and interventions performed: Yes SDOH Interventions    Flowsheet Row Most Recent Value  SDOH Interventions   Financial Strain Interventions Other (Comment)  [PAP]       SDOH Screenings   Alcohol Screen: Low Risk    Last Alcohol Screening Score (AUDIT): 0  Depression (PHQ2-9): Low Risk    PHQ-2 Score: 0  Financial  Resource Strain: High Risk   Difficulty of Paying Living Expenses: Hard  Food Insecurity: No Food Insecurity   Worried About Charity fundraiser in the Last Year: Never true   Ran Out of Food in the Last Year: Never true  Housing: Low Risk    Last Housing Risk Score: 0  Physical Activity: Insufficiently Active   Days of Exercise per Week: 2 days   Minutes of Exercise per Session: 20 min  Social Connections: Socially Isolated   Frequency of Communication with Friends and Family: More than three times a week   Frequency of Social Gatherings with Friends and Family: Three times a week   Attends Religious Services: Never   Active Member of Clubs or Organizations: No   Attends  Club or Organization Meetings: Never   Marital Status: Divorced  Stress: No Stress Concern Present   Feeling of Stress : Not at all  Tobacco Use: High Risk   Smoking Tobacco Use: Every Day   Smokeless Tobacco Use: Never   Passive Exposure: Not on file  Transportation Needs: No Transportation Needs   Lack of Transportation (Medical): No   Lack of Transportation (Non-Medical): No    CCM Care Plan  Allergies  Allergen Reactions   Penicillins Hives and Swelling    As a child. Has patient had a PCN reaction causing immediate rash, facial/tongue/throat swelling, SOB or lightheadedness with hypotension: YES  Has patient had a PCN reaction causing severe rash involving mucus membranes or skin necrosis: UNKNOWN Has patient had a PCN reaction that required hospitalization: UNKNOWN Has patient had a PCN reaction occurring within the last 10 years: NO   Aspirin Nausea Only   Ibuprofen Nausea Only   Lyrica [Pregabalin] Nausea Only   Acetaminophen Nausea Only    Medications Reviewed Today     Reviewed by Germaine Pomfret, [facility] (Pharmacist) on 07/20/21 at [place] List Status: <None>   Medication Order Taking? Sig Documenting Provider Last Dose Status Informant  diltiazem (DILACOR XR) 240 MG 24 hr capsule  [id number] Yes Take 1 capsule (240 mg total) by mouth daily. End, Harrell Gave, MD Taking Active Self  ergocalciferol (VITAMIN D2) 1.25 MG (50000 UT) capsule [id number] Yes Take 1 capsule (50,000 Units total) by mouth every 14 (fourteen) days. [name], MD Taking Active   Fluticasone-Umeclidin-[facility] ELLIPTA) 100-62.5-25 MCG/INH AEPB [id number] Yes Take 1 puff by mouth every evening.  Patient taking differently: Take 1 puff by mouth daily as needed (respiratory issues.).   [name], MD Taking Active Self  gabapentin (NEURONTIN) 300 MG capsule [id number] Yes Take 300-600 mg by mouth See admin instructions. Take 1 capsule (300 mg) by mouth in the morning, take 1 capsule (300 mg) by mouth at noon, and 2 capsules (600 mg) by mouth in the evening [provider] Taking Active Self  insulin detemir (LEVEMIR) 100 UNIT/ML injection [id number] Yes Inject 0.06 mLs (6 Units total) into the skin at bedtime. [name], MD Taking Active Self           Med Note Daron Offer A   Wed Jul 20, 2021  2:03 PM) 7 units  pravastatin (PRAVACHOL) 20 MG tablet [id number] Yes Take 1 tablet (20 mg total) by mouth daily with supper. [name], MD Taking Active   rivaroxaban (XARELTO) 20 MG TABS tablet [id number] Yes Take 1 tablet (20 mg total) by mouth daily with supper. Vickie Epley, MD Taking Active   tiZANidine (ZANAFLEX) 2 MG tablet [id number] Yes Take 2 mg by mouth every 12 (twelve) hours. Allyne Gee, MD Taking Active Self  traMADol Veatrice [place]) 50 MG tablet [id number] Yes Take 50 mg by mouth every 12 (twelve) hours. Allyne Gee, MD Taking Active Self  Med List Note Al Decant, RN 10/03/18 1438): UDS 10/03/18            Patient Active Problem List   Diagnosis Date Noted   Persistent atrial fibrillation ([place]) 05/07/2021   Diabetes mellitus type 2 with peripheral artery disease ([place]) 03/16/2021   DDD (degenerative disc disease), lumbar 03/06/2019   Primary  osteoarthritis involving multiple joints 03/06/2019   Lumbar radiculopathy 11/05/2018   Lumbar post-laminectomy syndrome 11/05/2018   Diabetic polyneuropathy associated with type 2 diabetes mellitus ([place]) 11/05/2018   CLE (columnar  lined esophagus)    Gastric irritation    Duodenum ulcer    Personal history of colonic polyps    Polyp of sigmoid colon    Diverticulosis of large intestine without diverticulitis    Benign neoplasm of ascending colon    Osteoarthritis of both hands 09/04/2017   Chronic pain of left knee 09/04/2017   Lymphedema 07/07/2016   Chronic hepatitis C without hepatic coma ([place]) 06/01/2016   Lumbar herniated disc 11/24/2015   Thrombocytopenia (HCC) 11/11/2015   Hepatitis C antibody test positive 07/13/2015   Chronic constipation 07/13/2015   CN (constipation) 07/13/2015   Diabetes mellitus with coincident hypertension ([place]) 03/08/2015   Dyslipidemia 03/08/2015   Paranoid schizophrenia ([place]) 03/08/2015   Restless leg 03/08/2015   Callus of foot 03/08/2015   Claudication ([place]) 03/08/2015   Chronic obstructive pulmonary disease ([place]) 03/08/2015   Corn or callus 03/08/2015   Essential (primary) hypertension 03/08/2015   HLD (hyperlipidemia) 03/08/2015   Peripheral vascular disease ([place]) 03/08/2015   Polyneuropathy 03/08/2015   Current tobacco use 03/08/2015   Vitamin D deficiency 04/26/2009   Avitaminosis D 04/26/2009    Immunization History  Administered Date(s) Administered   Fluad Quad(high Dose 65+) 04/28/2021   Influenza Inj Mdck Quad Pf 07/11/2019   Influenza, High Dose Seasonal PF 06/30/2016   Influenza,inj,Quad PF,6+ Mos 07/17/2017, 05/29/2018   Influenza-Unspecified 06/21/2020   PFIZER(Purple Top)SARS-COV-2 Vaccination 11/13/2019, 12/09/2019, 08/04/2020   Pneumococcal Conjugate-13 07/13/2015   Pneumococcal Polysaccharide-23 12/23/2009, 03/29/2020   Tdap 12/23/2009, 03/29/2020    Conditions to be addressed/monitored:  Hypertension,  Hyperlipidemia, Diabetes, Atrial Fibrillation, COPD, and Tobacco use  Care Plan : General Pharmacy (Adult)  Updates made by Germaine Pomfret, RPH since 08/04/2021 12:00 AM     Problem: Hypertension, Hyperlipidemia, Diabetes, Atrial Fibrillation, COPD, and Tobacco use   Priority: High     Long-Range Goal: Patient-Specific Goal   Start Date: 07/25/2021  Expected End Date: 07/25/2022  This Visit's Progress: On track  Recent Progress: On track  Priority: High  Note:   Current Barriers:  Unable to independently afford treatment regimen  Pharmacist Clinical Goal(s):  Patient will verbalize ability to afford treatment regimen achieve control of diabetes as evidenced by A1c less than 7% through collaboration with PharmD and provider.   Interventions: 1:1 collaboration with [name], MD regarding development and update of comprehensive plan of care as evidenced by provider attestation and co-signature Inter-disciplinary care team collaboration (see longitudinal plan of care) Comprehensive medication review performed; medication list updated in electronic medical record  Hypertension (BP goal <140/90) -Controlled -Current treatment: Diltiazem XR 240 mg daily -Medications previously tried: NA  -Current home readings: NA -Denies hypotensive/hypertensive symptoms -Recommended to continue current medication  Atrial Fibrillation (Goal: prevent stroke and major bleeding) -Controlled -CHADSVASC: 4 -Current treatment: Rate control: Diltiazem XR 240 mg daily Anticoagulation: Xarelto 20 mg daily -Medications previously tried: NA -Patient receiving Xarelto through [employer]. -May need to consider switching to Eliquis in 2023 due to easier financial assistance.  -Recommended to continue current medication  Hyperlipidemia: (LDL goal < 70) -Controlled -Current treatment: Pravastatin 20 mg daily -Medications previously tried: NA  -Recommended to continue current  medication  Diabetes (A1c goal <7%) -Uncontrolled Diagnosed 20+ years ago -Current medications: Levemir 7 units nightly  -Medications previously tried: Catering manager, Januvia, Metformin,   -Current home glucose readings fasting glucose: Not monitoring due to cost.  -Reports hypoglycemic/hyperglycemic symptoms -Current meal patterns: Eating more fruit.  -Patient received extensive counseling on use of freestyle libre system.  -  Recommended to continue current medication  COPD (Goal: control symptoms and prevent exacerbations) -Controlled -Current treatment  Trelegy 1 puff daily  -Medications previously tried: NA  -Exacerbations requiring treatment in last 6 months: Yes, hospitalized -Patient reports consistent use of maintenance inhaler.  -Frequency of rescue inhaler use: None -[place] Patient Assistance form for Trelegy completed by patient and faxed for review on 08/03/2021 -Will consider switching to [facility] for financial assistance in 2023.  -Recommended to continue current medication  Tobacco use (Goal Quit smoking) -Controlled -Previous quit attempts: None -Current treatment  1.5 ppd  -Patient is in a pre-contemplative stage of action and is not ready to quit.  -Provided contact information for [place] Quit Line (1-800-QUIT-NOW) and encouraged patient to reach out to this group for support.  Chronic Pain (Goal: Minimize pain symptoms) -Controlled -Managed by Dr. Humphrey Rolls  -Current treatment  Gabapentin 300 1 cap AM, 1 cap Noon, 2 caps PM Tizanidine 2 mg twice daily  Tramadol 50 mg twice daily  -Medications previously tried: NA  -Recommended to continue current medication  Patient Goals/Self-Care Activities Patient will:  - check glucose AT LEAST 5 times daily via CGM, document, and provide at future appointments  Follow Up Plan: Face to Face appointment with care management team member scheduled for: 09/28/2021 at 3:45 PM       Medication Assistance: Application for Trelegy   medication assistance program. in process.  Anticipated assistance start date TBD.  See plan of care for additional detail.  Compliance/Adherence/Medication fill history: Care Gaps: Diabetic Eye Exam Zoster Vaccines Colonoscopy COVID-19 Vaccine  Star-Rating Drugs: Pravastatin 20 mg last filled on 04/06/2021 for a 90-Day supply with [facility]  Patient's preferred pharmacy is:  [facility] [address] [place] - [place] [address] Phone: [phone] Fax: [place], [place] [address] [address] Phone: [phone] Fax: [place] [phone] [place], [place] - [place] [address] Phone: [phone] Fax: [phone]  [id number] - [place] [address] [place] [address] Phone: [phone] Fax: [phone]  Uses pill box? Yes Pt endorses 100% compliance  We discussed: Current pharmacy is preferred with insurance plan and patient is satisfied with pharmacy services Patient decided to: Continue current medication management strategy  Care Plan and Follow Up Patient Decision:  Patient agrees to Care Plan and Follow-up.  Plan: Face to Face appointment with care management team member scheduled for: 09/28/2021 at 3:45 PM  Malva Limes, [place] Pharmacist Practitioner  [facility] [phone]

## 2021-08-08 DIAGNOSIS — G8929 Other chronic pain: Secondary | ICD-10-CM | POA: Diagnosis not present

## 2021-08-08 DIAGNOSIS — G894 Chronic pain syndrome: Secondary | ICD-10-CM | POA: Diagnosis not present

## 2021-08-08 DIAGNOSIS — M5430 Sciatica, unspecified side: Secondary | ICD-10-CM | POA: Diagnosis not present

## 2021-08-08 DIAGNOSIS — M961 Postlaminectomy syndrome, not elsewhere classified: Secondary | ICD-10-CM | POA: Diagnosis not present

## 2021-08-08 DIAGNOSIS — Z716 Tobacco abuse counseling: Secondary | ICD-10-CM | POA: Diagnosis not present

## 2021-08-08 DIAGNOSIS — Z720 Tobacco use: Secondary | ICD-10-CM | POA: Diagnosis not present

## 2021-08-08 DIAGNOSIS — F209 Schizophrenia, unspecified: Secondary | ICD-10-CM | POA: Diagnosis not present

## 2021-08-08 DIAGNOSIS — M5416 Radiculopathy, lumbar region: Secondary | ICD-10-CM | POA: Diagnosis not present

## 2021-08-08 DIAGNOSIS — F1120 Opioid dependence, uncomplicated: Secondary | ICD-10-CM | POA: Diagnosis not present

## 2021-08-08 DIAGNOSIS — M545 Low back pain, unspecified: Secondary | ICD-10-CM | POA: Diagnosis not present

## 2021-08-08 DIAGNOSIS — I10 Essential (primary) hypertension: Secondary | ICD-10-CM | POA: Diagnosis not present

## 2021-08-08 DIAGNOSIS — E119 Type 2 diabetes mellitus without complications: Secondary | ICD-10-CM | POA: Diagnosis not present

## 2021-08-10 ENCOUNTER — Other Ambulatory Visit: Payer: Self-pay

## 2021-08-10 ENCOUNTER — Ambulatory Visit: Payer: PPO | Admitting: Cardiology

## 2021-08-10 ENCOUNTER — Encounter: Payer: Self-pay | Admitting: Cardiology

## 2021-08-10 VITALS — BP 120/78 | HR 69 | Ht 71.0 in | Wt 173.0 lb

## 2021-08-10 DIAGNOSIS — L03116 Cellulitis of left lower limb: Secondary | ICD-10-CM

## 2021-08-10 DIAGNOSIS — I4819 Other persistent atrial fibrillation: Secondary | ICD-10-CM

## 2021-08-10 DIAGNOSIS — I10 Essential (primary) hypertension: Secondary | ICD-10-CM

## 2021-08-10 DIAGNOSIS — E1151 Type 2 diabetes mellitus with diabetic peripheral angiopathy without gangrene: Secondary | ICD-10-CM

## 2021-08-10 NOTE — Patient Instructions (Signed)
Medication Instructions:  Your physician recommends that you continue on your current medications as directed. Please refer to the Current Medication list given to you today. *If you need a refill on your cardiac medications before your next appointment, please call your pharmacy*  Lab Work: None ordered. If you have labs (blood work) drawn today and your tests are completely normal, you will receive your results only by: [place] (if you have MyChart) OR A paper copy in the mail If you have any lab test that is abnormal or we need to change your treatment, we will call you to review the results.  Testing/Procedures: None ordered.  Follow-Up: At [facility], you and your health needs are our priority.  As part of our continuing mission to provide you with exceptional heart care, we have created designated Provider Care Teams.  These Care Teams include your primary Cardiologist (physician) and Advanced Practice Providers (APPs -  Physician Assistants and Nurse Practitioners) who all work together to provide you with the care you need, when you need it.  Your next appointment:   Your physician wants you to follow-up in: 6 months with one of the following Advanced Practice Providers on your designated Care Team:   Murray Hodgkins, NP Christell Faith, PA-C Cadence Kathlen Mody, PA-C You will receive a reminder letter in the mail two months in advance. If you don't receive a letter, please call our office to schedule the follow-up appointment.

## 2021-08-10 NOTE — Progress Notes (Signed)
Electrophysiology Office Follow up Visit Note:    Date:  08/10/2021   ID:  Mark Nash, DOB 1956/02/16, MRN [id number]  PCP:  [name], MD   [facility] Electrophysiologist:  Vickie Epley, MD    Interval History:    Mark Nash is a 65 y.o. male who presents for a follow up visit.  He was previously scheduled for an ablation procedure for his persistent atrial fibrillation but this was canceled on June 09, 2021 because of lower extremity wounds and swelling.  These were treated with antibiotics and wound care.  He saw his primary care physician on June 29, 2021.  The open wound on his right ankle had healed at that appointment but there was still some residual swelling and redness.  Additional antibiotics were prescribed at that time.  He was seen again in follow-up on November 16 by his primary care physician.  He tells me that the cellulitis on his right leg cleared but now it is on the left leg for the last couple of days.      Past Medical History:  Diagnosis Date   Arthritis    Asthma    Benign neoplasm of ascending colon    polyps   Constipation    COPD (chronic obstructive pulmonary disease) (HCC)    NO INHALER   Diabetes mellitus without complication (HCC)    Type 2   Diverticulosis    Dyspnea    RARE-WITH EXERTION DUE TO COPD   GERD (gastroesophageal reflux disease)    Hepatitis C    History of kidney stones    H/O   Hyperlipidemia    Hypertension 06/10/2018   currently not under control   Left inguinal hernia    Peripheral vascular disease (HCC)    Restless legs    Schizoaffective disorder (HCC)    Pt denies   Thrombocytopenia ([place])    Vitamin D deficiency     Past Surgical History:  Procedure Laterality Date   BACK SURGERY     CARDIOVERSION N/A 05/20/2021   Procedure: CARDIOVERSION;  Surgeon: Wellington Hampshire, MD;  Location: [facility];  Service: Cardiovascular;  Laterality: N/A;   COLONOSCOPY WITH PROPOFOL N/A 09/07/2017    Procedure: COLONOSCOPY WITH PROPOFOL;  Surgeon: Virgel Manifold, MD;  Location: [facility] ENDOS[facility];  Service: Endoscopy;  Laterality: N/A;   ESOPHAGOGASTRODUODENOSCOPY (EGD) WITH PROPOFOL N/A 12/07/2017   Procedure: ESOPHAGOGASTRODUODENOSCOPY (EGD) WITH PROPOFOL;  Surgeon: Virgel Manifold, MD;  Location: [facility] END[facility];  Service: Endoscopy;  Laterality: N/A;   FLEXIBLE SIGMOIDOSCOPY N/A 11/09/2017   Procedure: FLEXIBLE SIGMOIDOSCOPY;  Surgeon: Virgel Manifold, MD;  Location: [facility];  Service: Endoscopy;  Laterality: N/A;   HERNIA REPAIR     inguinal   INGUINAL HERNIA REPAIR Bilateral 06/21/2018   Procedure: LAPAROSCOPIC BILATERAL INGUINAL HERNIA REPAIR;  Surgeon: Vickie Epley, MD;  Location: [facility];  Service: General;  Laterality: Bilateral;   L3 TO L5 LAMINECTOMY FOR DECOMPRESSION  07/17/2016   [place] [facility]   REPAIR OF CEREBROSPINAL FLUID LEAK N/A 09/08/2016   Procedure: Lumbar wound exploration, repair of pseudomenigocele;  Surgeon: Kevan Ny Ditty, MD;  Location: [place];  Service: Neurosurgery;  Laterality: N/A;  Lumbar wound exploration, repair of pseudomenigocele   SINUSOTOMY      Current Medications: Current Meds  Medication Sig   Continuous Blood Gluc Receiver (FREESTYLE LIBRE 2 READER) DEVI Use to check glucose continuously   Continuous Blood Gluc Sensor (FREESTYLE LIBRE 2 SENSOR) MISC Place  1 sensor on the skin every 14 days. Use to check glucose continuously   diltiazem (DILACOR XR) 240 MG 24 hr capsule Take 1 capsule (240 mg total) by mouth daily.   ergocalciferol (VITAMIN D2) 1.25 MG (50000 UT) capsule Take 1 capsule (50,000 Units total) by mouth every 14 (fourteen) days.   Fluticasone-Umeclidin-Vilant (TRELEGY ELLIPTA) 100-62.5-25 MCG/ACT AEPB Inhale 1 puff into the lungs daily.   gabapentin (NEURONTIN) 300 MG capsule Take 300-600 mg by mouth See admin instructions. Take 1 capsule (300 mg) by mouth in the morning, take 1 capsule (300  mg) by mouth at noon, and 2 capsules (600 mg) by mouth in the evening   insulin detemir (LEVEMIR) 100 UNIT/ML injection Inject 0.06 mLs (6 Units total) into the skin at bedtime.   pravastatin (PRAVACHOL) 20 MG tablet Take 1 tablet (20 mg total) by mouth daily with supper.   rivaroxaban (XARELTO) 20 MG TABS tablet Take 1 tablet (20 mg total) by mouth daily with supper.   tiZANidine (ZANAFLEX) 2 MG tablet Take 2 mg by mouth every 12 (twelve) hours.   traMADol (ULTRAM) 50 MG tablet Take 50 mg by mouth every 12 (twelve) hours.     Allergies:   Penicillins, Aspirin, Ibuprofen, Lyrica [pregabalin], and Acetaminophen   Social History   Socioeconomic History   Marital status: Divorced    Spouse name: Not on file   Number of children: 2   Years of education: Not on file   Highest education level: 12th grade  Occupational History   Occupation: Disabled  Tobacco Use   Smoking status: Every Day    Packs/day: 1.50    Years: 46.00    Pack years: 69.00    Types: Cigarettes   Smokeless tobacco: Never  Vaping Use   Vaping Use: Never used  Substance and Sexual Activity   Alcohol use: No    Alcohol/week: 0.0 standard drinks   Drug use: No   Sexual activity: Not Currently  Other Topics Concern   Not on file  Social History Narrative   Lives alone   Social Determinants of Health   Financial Resource Strain: High Risk   Difficulty of Paying Living Expenses: Hard  Food Insecurity: No Food Insecurity   Worried About Running Out of Food in the Last Year: Never true   Ran Out of Food in the Last Year: Never true  Transportation Needs: No Transportation Needs   Lack of Transportation (Medical): No   Lack of Transportation (Non-Medical): No  Physical Activity: Insufficiently Active   Days of Exercise per Week: 2 days   Minutes of Exercise per Session: 20 min  Stress: No Stress Concern Present   Feeling of Stress : Not at all  Social Connections: Socially Isolated   Frequency of  Communication with Friends and Family: More than three times a week   Frequency of Social Gatherings with Friends and Family: Three times a week   Attends Religious Services: Never   Active Member of Clubs or Organizations: No   Attends Archivist Meetings: Never   Marital Status: Divorced     Family History: The patient's family history includes Colitis in his mother; Diabetes in his father and sister; Diverticulitis in his brother and mother; Heart disease in his father; Heart failure in his brother and mother; Hypertension in his father and mother.  ROS:   Please see the history of present illness.    All other systems reviewed and are negative.  EKGs/Labs/Other Studies Reviewed:  The following studies were reviewed today:   EKG:  The ekg ordered today demonstrates Atrial flutter with 4-1 AV conduction.  Ventricular rate of 69 bpm.  Recent Labs: 09/15/2020: ALT 14 04/19/2021: Magnesium 2.0; TSH 1.706 05/25/2021: BUN 11; Creatinine, Ser 0.77; Hemoglobin 15.2; Platelets 167; Potassium 4.3; Sodium 140  Recent Lipid Panel    Component Value Date/Time   CHOL 121 09/15/2020 1500   CHOL 112 07/13/2015 1018   TRIG 76 09/15/2020 1500   HDL 51 09/15/2020 1500   HDL 62 07/13/2015 1018   CHOLHDL 2.4 09/15/2020 1500   VLDL 12 12/15/2016 1405   LDLCALC 54 09/15/2020 1500    Physical Exam:    VS:  BP 120/78    Pulse 69    Ht 5\' 11"  (1.803 m)    Wt 173 lb (78.5 kg)    SpO2 95%    BMI 24.13 kg/m     Wt Readings from Last 3 Encounters:  08/10/21 173 lb (78.5 kg)  07/13/21 172 lb (78 kg)  06/29/21 170 lb (77.1 kg)     GEN:  Well nourished, well developed in no acute distress HEENT: Normal NECK: No JVD; No carotid bruits LYMPHATICS: No lymphadenopathy CARDIAC: RRR, no murmurs, rubs, gallops RESPIRATORY:  Clear to auscultation without rales, wheezing or rhonchi  ABDOMEN: Soft, non-tender, non-distended MUSCULOSKELETAL:  No edema; No deformity  SKIN: Red left lower  extremity with bandage on the lateral aspect of his shin. NEUROLOGIC:  Alert and oriented x 3 PSYCHIATRIC:  Normal affect        ASSESSMENT:    1. Persistent atrial fibrillation ([place])   2. Diabetes mellitus type 2 with peripheral artery disease ([place])   3. Essential (primary) hypertension   4. Cellulitis of left lower extremity    PLAN:    In order of problems listed above:  #Persistent atrial fibrillation/flutter Remains in atrial flutter.  On Xarelto for stroke prophylaxis.  Is not a candidate currently for catheter ablation given recurrent lower extremity wounds and infection.  For now, continue rate control with diltiazem and stroke prophylaxis with Xarelto.  I will have him touch base with an APP in about 6 months to see how he is doing.  He would have to be clear of infection for at least 3 months before I would consider rescheduling him for an ablation.  #Hypertension Controlled  #Cellulitis Follows with his primary care physician for this.  They have been working on antibiotic regimens to help combat this.  He is planning to reach out to his primary care physician today.   Medication Adjustments/Labs and Tests Ordered: Current medicines are reviewed at length with the patient today.  Concerns regarding medicines are outlined above.  Orders Placed This Encounter  Procedures   EKG 12-Lead   No orders of the defined types were placed in this encounter.    Signed, [name], MD, [facility], [facility] 08/10/2021 2:42 PM    Electrophysiology [facility]

## 2021-08-11 ENCOUNTER — Encounter: Payer: Self-pay | Admitting: Internal Medicine

## 2021-08-11 ENCOUNTER — Other Ambulatory Visit: Payer: Self-pay

## 2021-08-11 ENCOUNTER — Ambulatory Visit (INDEPENDENT_AMBULATORY_CARE_PROVIDER_SITE_OTHER): Payer: PPO | Admitting: Internal Medicine

## 2021-08-11 VITALS — BP 124/72 | HR 68 | Temp 98.3°F | Resp 18 | Ht 71.0 in | Wt 177.2 lb

## 2021-08-11 DIAGNOSIS — S81802A Unspecified open wound, left lower leg, initial encounter: Secondary | ICD-10-CM | POA: Diagnosis not present

## 2021-08-11 DIAGNOSIS — L03116 Cellulitis of left lower limb: Secondary | ICD-10-CM | POA: Diagnosis not present

## 2021-08-11 MED ORDER — DOXYCYCLINE HYCLATE 100 MG PO TABS: 100.0000 mg | ORAL_TABLET | Freq: Two times a day (BID) | ORAL | 0 refills | Status: AC

## 2021-08-11 NOTE — Progress Notes (Signed)
Established Patient Office Visit  Subjective:  Patient ID: Mark Nash, male    DOB: Mar 15, 1956  Age: 65 y.o. MRN: [id number]  CC:  Chief Complaint  Patient presents with   Cellulitis    Left leg for 3 days    HPI Mark Nash presents for left calf rash. He was diagnosed with cellulitis on his right calf on 06/29/21. He was treated with Levaquin 500 mg and Doxycycline 200 mg x 7 days. He does have chronic lymphadema and did have a small wound on the right calf previously but this had resolved at his last visit. Today he states that after taking those antibiotics, the wound and cellulitis on his right calf resolved but he now has the same issue on the left lower extremity. He was outside 2 days ago lifting heavy objects and moving thins around and thinks he may have scratched himself. He now has a small wound on the lateral aspect of his left calf. He has noted some purulent like drainage from the wound. He has been using topical antibiotics and keeping the wound covered but yesterday noted that his calf is now becoming more tight, painful and red. It is occasionally warm to the touch. He denies fevers. He is concerned because he is to undergo a cardiac ablation but cannot have any active infections in order for this procedure to occur. He does have some chronic lymphedema, tries to keep his legs elevated but notes this hurts his back. He does have compression stocking but has not been using them. He does have a penicillin allergy.  RASH Duration:  2 days  Location: left calf Itching: no Burning: yes Redness: yes Oozing: yes Scaling: no Blisters: no Painful: yes Fevers: no History of same: yes Context: worse Alleviating factors: nothing Treatments attempted: topical antibiotics No other systemic symptoms   Past Medical History:  Diagnosis Date   Arthritis    Asthma    Benign neoplasm of ascending colon    polyps   Constipation    COPD (chronic obstructive pulmonary  disease) (HCC)    NO INHALER   Diabetes mellitus without complication (HCC)    Type 2   Diverticulosis    Dyspnea    RARE-WITH EXERTION DUE TO COPD   GERD (gastroesophageal reflux disease)    Hepatitis C    History of kidney stones    H/O   Hyperlipidemia    Hypertension 06/10/2018   currently not under control   Left inguinal hernia    Peripheral vascular disease (HCC)    Restless legs    Schizoaffective disorder (HCC)    Pt denies   Thrombocytopenia ([place])    Vitamin D deficiency     Past Surgical History:  Procedure Laterality Date   BACK SURGERY     CARDIOVERSION N/A 05/20/2021   Procedure: CARDIOVERSION;  Surgeon: Wellington Hampshire, MD;  Location: [facility];  Service: Cardiovascular;  Laterality: N/A;   COLONOSCOPY WITH PROPOFOL N/A 09/07/2017   Procedure: COLONOSCOPY WITH PROPOFOL;  Surgeon: Virgel Manifold, MD;  Location: [facility];  Service: Endoscopy;  Laterality: N/A;   ESOPHAGOGASTRODUODENOSCOPY (EGD) WITH PROPOFOL N/A 12/07/2017   Procedure: ESOPHAGOGASTRODUODENOSCOPY (EGD) WITH PROPOFOL;  Surgeon: Virgel Manifold, MD;  Location: [facility];  Service: Endoscopy;  Laterality: N/A;   FLEXIBLE SIGMOIDOSCOPY N/A 11/09/2017   Procedure: FLEXIBLE SIGMOIDOSCOPY;  Surgeon: Virgel Manifold, MD;  Location: [facility];  Service: Endoscopy;  Laterality: N/A;   HERNIA REPAIR     inguinal  INGUINAL HERNIA REPAIR Bilateral 06/21/2018   Procedure: LAPAROSCOPIC BILATERAL INGUINAL HERNIA REPAIR;  Surgeon: Vickie Epley, MD;  Location: [facility];  Service: General;  Laterality: Bilateral;   L3 TO L5 LAMINECTOMY FOR DECOMPRESSION  07/17/2016   [place] [facility]   REPAIR OF CEREBROSPINAL FLUID LEAK N/A 09/08/2016   Procedure: Lumbar wound exploration, repair of pseudomenigocele;  Surgeon: Kevan Ny Ditty, MD;  Location: [place];  Service: Neurosurgery;  Laterality: N/A;  Lumbar wound exploration, repair of pseudomenigocele   SINUSOTOMY       Family History  Problem Relation Age of Onset   Heart failure Mother    Hypertension Mother    Diverticulitis Mother    Colitis Mother    Diabetes Father    Hypertension Father    Heart disease Father    Diabetes Sister    Heart failure Brother    Diverticulitis Brother     Social History   Socioeconomic History   Marital status: Divorced    Spouse name: Not on file   Number of children: 2   Years of education: Not on file   Highest education level: 12th grade  Occupational History   Occupation: Disabled  Tobacco Use   Smoking status: Every Day    Packs/day: 1.50    Years: 46.00    Pack years: 69.00    Types: Cigarettes   Smokeless tobacco: Never  Vaping Use   Vaping Use: Never used  Substance and Sexual Activity   Alcohol use: No    Alcohol/week: 0.0 standard drinks   Drug use: No   Sexual activity: Not Currently  Other Topics Concern   Not on file  Social History Narrative   Lives alone   Social Determinants of Health   Financial Resource Strain: High Risk   Difficulty of Paying Living Expenses: Hard  Food Insecurity: No Food Insecurity   Worried About Running Out of Food in the Last Year: Never true   Ran Out of Food in the Last Year: Never true  Transportation Needs: No Transportation Needs   Lack of Transportation (Medical): No   Lack of Transportation (Non-Medical): No  Physical Activity: Insufficiently Active   Days of Exercise per Week: 2 days   Minutes of Exercise per Session: 20 min  Stress: No Stress Concern Present   Feeling of Stress : Not at all  Social Connections: Socially Isolated   Frequency of Communication with Friends and Family: More than three times a week   Frequency of Social Gatherings with Friends and Family: Three times a week   Attends Religious Services: Never   Active Member of Clubs or Organizations: No   Attends Archivist Meetings: Never   Marital Status: Divorced  Human resources officer Violence: Not At Risk    Fear of Current or Ex-Partner: No   Emotionally Abused: No   Physically Abused: No   Sexually Abused: No    Outpatient Medications Prior to Visit  Medication Sig Dispense Refill   Continuous Blood Gluc Receiver (FREESTYLE LIBRE 2 READER) DEVI Use to check glucose continuously 1 each 0   Continuous Blood Gluc Sensor (FREESTYLE LIBRE 2 SENSOR) MISC Place 1 sensor on the skin every 14 days. Use to check glucose continuously 2 each 11   diltiazem (DILACOR XR) 240 MG 24 hr capsule Take 1 capsule (240 mg total) by mouth daily. 90 capsule 3   ergocalciferol (VITAMIN D2) 1.25 MG (50000 UT) capsule Take 1 capsule (50,000 Units total) by mouth every  14 (fourteen) days. 6 capsule 3   Fluticasone-Umeclidin-Vilant (TRELEGY ELLIPTA) 100-62.5-25 MCG/ACT AEPB Inhale 1 puff into the lungs daily. 3 each 3   gabapentin (NEURONTIN) 300 MG capsule Take 300-600 mg by mouth See admin instructions. Take 1 capsule (300 mg) by mouth in the morning, take 1 capsule (300 mg) by mouth at noon, and 2 capsules (600 mg) by mouth in the evening     insulin detemir (LEVEMIR) 100 UNIT/ML injection Inject 0.06 mLs (6 Units total) into the skin at bedtime. 10 mL 0   pravastatin (PRAVACHOL) 20 MG tablet Take 1 tablet (20 mg total) by mouth daily with supper. 90 tablet 3   rivaroxaban (XARELTO) 20 MG TABS tablet Take 1 tablet (20 mg total) by mouth daily with supper. 90 tablet 1   tiZANidine (ZANAFLEX) 2 MG tablet Take 2 mg by mouth every 12 (twelve) hours.     traMADol (ULTRAM) 50 MG tablet Take 50 mg by mouth every 12 (twelve) hours.     No facility-administered medications prior to visit.    Allergies  Allergen Reactions   Penicillins Hives and Swelling    As a child. Has patient had a PCN reaction causing immediate rash, facial/tongue/throat swelling, SOB or lightheadedness with hypotension: YES  Has patient had a PCN reaction causing severe rash involving mucus membranes or skin necrosis: UNKNOWN Has patient had a PCN  reaction that required hospitalization: UNKNOWN Has patient had a PCN reaction occurring within the last 10 years: NO   Aspirin Nausea Only   Ibuprofen Nausea Only   Lyrica [Pregabalin] Nausea Only   Acetaminophen Nausea Only    ROS Review of Systems  Constitutional:  Negative for chills and fever.  Respiratory:  Negative for shortness of breath.   Cardiovascular:  Negative for chest pain.  Gastrointestinal:  Negative for abdominal pain.  Skin:  Positive for rash and wound.     Objective:    Physical Exam Constitutional:      Appearance: Normal appearance.  HENT:     Head: Normocephalic and atraumatic.  Eyes:     Conjunctiva/sclera: Conjunctivae normal.  Cardiovascular:     Rate and Rhythm: Normal rate and regular rhythm.  Pulmonary:     Effort: Pulmonary effort is normal.     Breath sounds: Normal breath sounds.  Musculoskeletal:        General: Tenderness present.     Right lower leg: Edema present.     Left lower leg: Edema present.  Skin:    General: Skin is warm and dry.     Comments: 1x1 cm wound on lateral aspect of left calf with minimal purulent discharge present and surrounding erythema. 1+ BLE pitting edema, no other fissures/openings in the skin, dry skin.   Neurological:     Mental Status: He is alert.  Psychiatric:        Mood and Affect: Mood normal.        Behavior: Behavior normal.    BP 124/72    Pulse 68    Temp 98.3 F (36.8 C) (Oral)    Resp 18    Ht '5\' 11"'  (1.803 m)    Wt 177 lb 3.2 oz (80.4 kg)    SpO2 98%    BMI 24.71 kg/m  Wt Readings from Last 3 Encounters:  08/10/21 173 lb (78.5 kg)  07/13/21 172 lb (78 kg)  06/29/21 170 lb (77.1 kg)     Health Maintenance Due  Topic Date Due   OPHTHALMOLOGY EXAM  Never done   Zoster Vaccines- Shingrix (1 of 2) Never done   COLONOSCOPY (Pts 45-53yr Insurance coverage will need to be confirmed)  09/07/2018   COVID-19 Vaccine (4 - Booster for Pfizer series) 09/29/2020   URINE MICROALBUMIN   09/15/2021    There are no preventive care reminders to display for this patient.  Lab Results  Component Value Date   TSH 1.706 04/19/2021   Lab Results  Component Value Date   WBC 6.7 05/25/2021   HGB 15.2 05/25/2021   HCT 43.3 05/25/2021   MCV 90 05/25/2021   PLT 167 05/25/2021   Lab Results  Component Value Date   NA 140 05/25/2021   K 4.3 05/25/2021   CO2 25 05/25/2021   GLUCOSE 143 (H) 05/25/2021   BUN 11 05/25/2021   CREATININE 0.77 05/25/2021   BILITOT 1.2 09/15/2020   ALKPHOS 104 03/30/2017   AST 17 09/15/2020   ALT 14 09/15/2020   PROT 6.5 09/15/2020   ALBUMIN 4.1 03/30/2017   CALCIUM 8.8 05/25/2021   ANIONGAP 11 04/21/2021   EGFR 99 05/25/2021   Lab Results  Component Value Date   CHOL 121 09/15/2020   Lab Results  Component Value Date   HDL 51 09/15/2020   Lab Results  Component Value Date   LDLCALC 54 09/15/2020   Lab Results  Component Value Date   TRIG 76 09/15/2020   Lab Results  Component Value Date   CHOLHDL 2.4 09/15/2020   Lab Results  Component Value Date   HGBA1C 7.6 (A) 07/13/2021      Assessment & Plan:   1. Left leg cellulitis/Leg wound, left, initial encounter: Wound culture obtained today. Wound cleaned, covered with topical antibiotics and dressed.  Discussed appropriate wound care with patient. Will treat with Doxycycline 100 mg BID x 7 days. Discussed if erythema spreads, pain becomes severe or fevers to let [place] or go to the ER. Has a history of non-healing wounds, discussion of referral to ID had in the past. Discussed elevating legs and compression stockings once wound/infection healed. Consider lymphedema clinic. Follow up in 1 week for wound recheck.   - doxycycline (VIBRA-TABS) 100 MG tablet; Take 1 tablet (100 mg total) by mouth 2 (two) times daily for 7 days.  Dispense: 14 tablet; Refill: 0 - Wound culture   Follow-up: Return in about 1 week (around 08/18/2021).    ETeodora Medici DO

## 2021-08-11 NOTE — Patient Instructions (Signed)
It was great seeing you today!  Plan discussed at today's visit: -Doxycycline 100 mg twice a day for 7 days  -Keep wound covered, do not submerge in water, can use topical antibiotic like Neosporin  Follow up in: 1 week  Take care and let us know if you have any questions or concerns prior to your next visit.  Dr. Rosana Berger

## 2021-08-15 LAB — WOUND CULTURE
MICRO NUMBER:: 12767148
RESULT:: NO GROWTH
SPECIMEN QUALITY:: ADEQUATE

## 2021-08-18 ENCOUNTER — Telehealth: Payer: Self-pay

## 2021-08-18 ENCOUNTER — Ambulatory Visit (INDEPENDENT_AMBULATORY_CARE_PROVIDER_SITE_OTHER): Payer: PPO | Admitting: Internal Medicine

## 2021-08-18 ENCOUNTER — Ambulatory Visit: Payer: Self-pay | Admitting: *Deleted

## 2021-08-18 ENCOUNTER — Ambulatory Visit
Admission: RE | Admit: 2021-08-18 | Discharge: 2021-08-18 | Disposition: A | Discharge status: Home / Self Care | Payer: PPO | Source: Ambulatory Visit | Attending: Internal Medicine | Admitting: Internal Medicine

## 2021-08-18 ENCOUNTER — Encounter: Payer: Self-pay | Admitting: Internal Medicine

## 2021-08-18 VITALS — BP 128/82 | HR 96 | Temp 98.3°F | Resp 16 | Ht 71.0 in | Wt 178.3 lb

## 2021-08-18 DIAGNOSIS — M7989 Other specified soft tissue disorders: Secondary | ICD-10-CM

## 2021-08-18 MED ORDER — FUROSEMIDE 20 MG PO TABS: 20.0000 mg | ORAL_TABLET | Freq: Every day | ORAL | 1 refills | Status: DC

## 2021-08-18 NOTE — Patient Instructions (Addendum)
It was great seeing you today!  Plan discussed at today's visit: -Blood work ordered today, results will be uploaded to [facility].  -Venous Doppler ordered today -For chronic swelling, start wearing compression stocking  -Start using moisturizer for legs, [employer] for Diabetics  -Water pill sent to pharmacy to take as needed for swelling    Follow up in: 1 month   Take care and let us know if you have any questions or concerns prior to your next visit.  Dr. Rosana Berger        Lymphedema Lymphedema is swelling that is caused by the abnormal collection of lymph in the tissues under the skin. Lymph is excess fluid from the tissues in your body that is removed through the lymphatic system. This system is part of your body's defense system (immune system) and includes lymph nodes and lymph vessels. The lymph vessels collect and carry the excess fluid, fats, proteins, and waste from the tissues of the body to the bloodstream. This system also works to clean and remove bacteria and waste products from the body. Lymphedema occurs when the lymphatic system is blocked. When the lymph vessels or lymph nodes are blocked or damaged, lymph does not drain properly. This causes an abnormal buildup of lymph, which leads to swelling in the affected area. This may include the trunk area, or an arm or leg. Lymphedema cannot be cured by medicines, but various methods can be used to help reduce the swelling. What are the causes? The cause of this condition depends on the type of lymphedema that you have. Primary lymphedema is caused by the absence of lymph vessels or having abnormal lymph vessels at birth. Secondary lymphedema occurs when lymph vessels are blocked or damaged. Secondary lymphedema is more common. Common causes of lymph vessel blockage include: Skin infection, such as cellulitis. Infection by parasites (filariasis). Injury. Radiation therapy. Cancer. Formation of scar tissue. Surgery. What are  the signs or symptoms? Symptoms of this condition include: Swelling of the arm or leg. A heavy or tight feeling in the arm or leg. Swelling of the feet, toes, or fingers. Shoes or rings may fit more tightly than before. Redness of the skin over the affected area. Limited movement of the affected limb. Sensitivity to touch or discomfort in the affected limb. How is this diagnosed? This condition may be diagnosed based on: Your symptoms and medical history. A physical exam. Bioimpedance spectroscopy. In this test, painless electrical currents are used to measure fluid levels in your body. Imaging tests, such as: MRI. CT scan. Duplex ultrasound. This test uses sound waves to produce images of the vessels and the blood flow on a screen. Lymphoscintigraphy. In this test, a low dose of a radioactive substance is injected to trace the flow of lymph through your lymph vessels. Lymphangiography. In this test, a contrast dye is injected into the lymph vessel to help show blockages. How is this treated? If an underlying condition is causing the lymphedema, that condition will be treated. For example, antibiotic medicines may be used to treat an infection. Treatment for this condition will depend on the cause of your lymphedema. Treatment may include: Complete decongestive therapy (CDT). This is done by a certified lymphedema therapist to reduce fluid congestion. This therapy includes: Skin care. Compression wrapping of the affected area. Manual lymph drainage. This is a special massage technique that promotes lymph drainage out of a limb. Specific exercises. Certain exercises can help fluid move out of the affected limb. Compression. Various methods  may be used to apply pressure to the affected limb to reduce the swelling. They include: Wearing compression stockings or sleeves on the affected limb. Wrapping the affected limb with special bandages. Surgery. This is usually done for severe cases  only. For example, surgery may be done if you have trouble moving the limb or if the swelling does not get better with other treatments. Follow these instructions at home: Self-care The affected area is more likely to become injured or infected. Take these steps to help prevent infection: Keep the affected area clean and dry. Use approved creams or lotions to keep the skin moisturized. Protect your skin from cuts: Use gloves while cooking or gardening. Do not walk barefoot. If you shave the affected area, use an Copy. Do not wear tight clothes, shoes, or jewelry. Eat a healthy diet that includes a lot of fruits and vegetables. Activity Do exercises as told by your health care provider. Do not sit with your legs crossed. When possible, keep the affected limb raised (elevated) above the level of your heart. Avoid carrying things with an arm that is affected by lymphedema. General instructions Wear compression stockings or sleeves as told by your health care provider. Note any changes in size of the affected limb. You may be instructed to take regular measurements and keep track of them. Take over-the-counter and prescription medicines only as told by your health care provider. If you were prescribed an antibiotic medicine, take or apply it as told by your health care provider. Do not stop using the antibiotic even if you start to feel better or if your condition improves. Do not use heating pads or ice packs on the affected area. Avoid having blood draws, IV insertions, or blood pressure checks on the affected limb. Keep all follow-up visits. This is important. Contact a health care provider if you: Continue to have swelling in your limb. Have fluid leaking from the skin of your swollen limb. Have a cut that does not heal. Have redness or pain in the affected area. Develop purplish spots, rash, blisters, or sores (lesions) on your affected limb. Get help right away if  you: Have new swelling in your limb that starts suddenly. Have shortness of breath or chest pain. Have a fever or chills. These symptoms may represent a serious problem that is an emergency. Do not wait to see if the symptoms will go away. Get medical help right away. Call your local emergency services (911 in the [place] Do not drive yourself to the hospital. Summary Lymphedema is swelling that is caused by the abnormal collection of lymph in the tissues under the skin. Lymph is fluid from the tissues in your body that is removed through the lymphatic system. This system collects and carries excess fluid, fats, proteins, and wastes from the tissues of the body to the bloodstream. Lymphedema causes swelling, pain, and redness in the affected area. This may include the trunk area, or an arm or leg. Treatment for this condition may depend on the cause of your lymphedema. Treatment may include treating the underlying cause, complete decongestive therapy (CDT), compression methods, or surgery. This information is not intended to replace advice given to you by your health care provider. Make sure you discuss any questions you have with your health care provider. Document Revised: 06/09/2020 Document Reviewed: 06/09/2020 Elsevier Patient Education  2022 [employer].

## 2021-08-18 NOTE — Progress Notes (Signed)
° ° °  Chronic Care Management Pharmacy Assistant   Name: Mark Nash  MRN: [id number] DOB: Jun 24, 1956  Patient Assistant Application   Patient completed an application for patient assistance for Trelegy and Ventolin with [employer]. The application was faxed to the company on 08/03/2021 for processing.  I reached out to [facility] today to confirm receipt of fax, and to check status of the application. I spoke with the representative who confirmed that they did receive the patient's application. After she looked over the patient's application she was able to process it why I was on the phone with her, and get it approved. The patient has been approved for the Trelegy until 08/27/2021. The patient will be sent a 90-Day supply of Trelegy, and it will be shipped to the patient's address in 7-10 business days. Per the representative they do not do patient assistance for Ventolin anymore.   For 2023 the patient will have to meet the $600.00 out-of-pocket expense with his medications before he can submit a renewal application for the 4825 year.   I called the patient to give him the update on the status of his application, and to inform him that he was approved. I had to leave a voice mail advising him of the approval, and providing my direct number of [phone] if he has any questions he can give me a call back directly.     Medications: Outpatient Encounter Medications as of 08/18/2021  Medication Sig Note   Continuous Blood Gluc Receiver (FREESTYLE LIBRE 2 READER) DEVI Use to check glucose continuously    Continuous Blood Gluc Sensor (FREESTYLE LIBRE 2 SENSOR) MISC Place 1 sensor on the skin every 14 days. Use to check glucose continuously    diltiazem (DILACOR XR) 240 MG 24 hr capsule Take 1 capsule (240 mg total) by mouth daily.    doxycycline (VIBRA-TABS) 100 MG tablet Take 1 tablet (100 mg total) by mouth 2 (two) times daily for 7 days. (Patient not taking: Reported on 08/18/2021)    ergocalciferol  (VITAMIN D2) 1.25 MG (50000 UT) capsule Take 1 capsule (50,000 Units total) by mouth every 14 (fourteen) days.    Fluticasone-Umeclidin-Vilant (TRELEGY ELLIPTA) 100-62.5-25 MCG/ACT AEPB Inhale 1 puff into the lungs daily.    furosemide (LASIX) 20 MG tablet Take 1 tablet (20 mg total) by mouth daily.    gabapentin (NEURONTIN) 300 MG capsule Take 300-600 mg by mouth See admin instructions. Take 1 capsule (300 mg) by mouth in the morning, take 1 capsule (300 mg) by mouth at noon, and 2 capsules (600 mg) by mouth in the evening    insulin detemir (LEVEMIR) 100 UNIT/ML injection Inject 0.06 mLs (6 Units total) into the skin at bedtime. 07/20/2021: 7 units   pravastatin (PRAVACHOL) 20 MG tablet Take 1 tablet (20 mg total) by mouth daily with supper.    rivaroxaban (XARELTO) 20 MG TABS tablet Take 1 tablet (20 mg total) by mouth daily with supper.    tiZANidine (ZANAFLEX) 2 MG tablet Take 2 mg by mouth every 12 (twelve) hours.    traMADol (ULTRAM) 50 MG tablet Take 50 mg by mouth every 12 (twelve) hours.    No facility-administered encounter medications on file as of 08/18/2021.   Lynann Bologna, CPA/CMA Clinical Pharmacist Assistant Phone: [phone]

## 2021-08-18 NOTE — Progress Notes (Signed)
Established Patient Office Visit  Subjective:  Patient ID: Mark Nash, male    DOB: 11-01-1955  Age: 65 y.o. MRN: [id number]  CC:  Chief Complaint  Patient presents with   Edema    left    HPI Mark Nash presents for follow up on left leg cellulitis. He had some trauma to the area and had a small wound with surrounding erythema. He was started on Doxycycline but wound culture came back negative and this was discontinued. Today he states that the wound has healed and is no longer red and swollen, however the surrounding skin on his left calf is still swollen and red and feels tight. He continues to deny fevers. He has had some shortness of breath with exertion lately but attributes that to being out of his Trelegy. No swelling or skin changes in other leg or anywhere else.   Past Medical History:  Diagnosis Date   Arthritis    Asthma    Benign neoplasm of ascending colon    polyps   Constipation    COPD (chronic obstructive pulmonary disease) (HCC)    NO INHALER   Diabetes mellitus without complication (HCC)    Type 2   Diverticulosis    Dyspnea    RARE-WITH EXERTION DUE TO COPD   GERD (gastroesophageal reflux disease)    Hepatitis C    History of kidney stones    H/O   Hyperlipidemia    Hypertension 06/10/2018   currently not under control   Left inguinal hernia    Peripheral vascular disease (HCC)    Restless legs    Schizoaffective disorder (HCC)    Pt denies   Thrombocytopenia ([place])    Vitamin D deficiency     Past Surgical History:  Procedure Laterality Date   BACK SURGERY     CARDIOVERSION N/A 05/20/2021   Procedure: CARDIOVERSION;  Surgeon: Wellington Hampshire, MD;  Location: [facility];  Service: Cardiovascular;  Laterality: N/A;   COLONOSCOPY WITH PROPOFOL N/A 09/07/2017   Procedure: COLONOSCOPY WITH PROPOFOL;  Surgeon: Virgel Manifold, MD;  Location: [facility];  Service: Endoscopy;  Laterality: N/A;   ESOPHAGOGASTRODUODENOSCOPY (EGD) WITH  PROPOFOL N/A 12/07/2017   Procedure: ESOPHAGOGASTRODUODENOSCOPY (EGD) WITH PROPOFOL;  Surgeon: Virgel Manifold, MD;  Location: [facility];  Service: Endoscopy;  Laterality: N/A;   FLEXIBLE SIGMOIDOSCOPY N/A 11/09/2017   Procedure: FLEXIBLE SIGMOIDOSCOPY;  Surgeon: Virgel Manifold, MD;  Location: [facility];  Service: Endoscopy;  Laterality: N/A;   HERNIA REPAIR     inguinal   INGUINAL HERNIA REPAIR Bilateral 06/21/2018   Procedure: LAPAROSCOPIC BILATERAL INGUINAL HERNIA REPAIR;  Surgeon: Vickie Epley, MD;  Location: [facility];  Service: General;  Laterality: Bilateral;   L3 TO L5 LAMINECTOMY FOR DECOMPRESSION  07/17/2016   [facility]   REPAIR OF CEREBROSPINAL FLUID LEAK N/A 09/08/2016   Procedure: Lumbar wound exploration, repair of pseudomenigocele;  Surgeon: Kevan Ny Ditty, MD;  Location: [facility];  Service: Neurosurgery;  Laterality: N/A;  Lumbar wound exploration, repair of pseudomenigocele   SINUSOTOMY      Family History  Problem Relation Age of Onset   Heart failure Mother    Hypertension Mother    Diverticulitis Mother    Colitis Mother    Diabetes Father    Hypertension Father    Heart disease Father    Diabetes Sister    Heart failure Brother    Diverticulitis Brother     Social History  Socioeconomic History   Marital status: Divorced    Spouse name: Not on file   Number of children: 2   Years of education: Not on file   Highest education level: 12th grade  Occupational History   Occupation: Disabled  Tobacco Use   Smoking status: Every Day    Packs/day: 1.50    Years: 46.00    Pack years: 69.00    Types: Cigarettes   Smokeless tobacco: Never  Vaping Use   Vaping Use: Never used  Substance and Sexual Activity   Alcohol use: No    Alcohol/week: 0.0 standard drinks   Drug use: No   Sexual activity: Not Currently  Other Topics Concern   Not on file  Social History Narrative   Lives alone   Social  Determinants of Health   Financial Resource Strain: High Risk   Difficulty of Paying Living Expenses: Hard  Food Insecurity: No Food Insecurity   Worried About Running Out of Food in the Last Year: Never true   Ran Out of Food in the Last Year: Never true  Transportation Needs: No Transportation Needs   Lack of Transportation (Medical): No   Lack of Transportation (Non-Medical): No  Physical Activity: Insufficiently Active   Days of Exercise per Week: 2 days   Minutes of Exercise per Session: 20 min  Stress: No Stress Concern Present   Feeling of Stress : Not at all  Social Connections: Socially Isolated   Frequency of Communication with Friends and Family: More than three times a week   Frequency of Social Gatherings with Friends and Family: Three times a week   Attends Religious Services: Never   Active Member of Clubs or Organizations: No   Attends Archivist Meetings: Never   Marital Status: Divorced  Human resources officer Violence: Not At Risk   Fear of Current or Ex-Partner: No   Emotionally Abused: No   Physically Abused: No   Sexually Abused: No    Outpatient Medications Prior to Visit  Medication Sig Dispense Refill   Continuous Blood Gluc Receiver (FREESTYLE LIBRE 2 READER) DEVI Use to check glucose continuously (Patient not taking: Reported on 08/11/2021) 1 each 0   Continuous Blood Gluc Sensor (FREESTYLE LIBRE 2 SENSOR) MISC Place 1 sensor on the skin every 14 days. Use to check glucose continuously (Patient not taking: Reported on 08/11/2021) 2 each 11   diltiazem (DILACOR XR) 240 MG 24 hr capsule Take 1 capsule (240 mg total) by mouth daily. 90 capsule 3   doxycycline (VIBRA-TABS) 100 MG tablet Take 1 tablet (100 mg total) by mouth 2 (two) times daily for 7 days. 14 tablet 0   ergocalciferol (VITAMIN D2) 1.25 MG (50000 UT) capsule Take 1 capsule (50,000 Units total) by mouth every 14 (fourteen) days. 6 capsule 3   Fluticasone-Umeclidin-Vilant (TRELEGY ELLIPTA)  100-62.5-25 MCG/ACT AEPB Inhale 1 puff into the lungs daily. 3 each 3   gabapentin (NEURONTIN) 300 MG capsule Take 300-600 mg by mouth See admin instructions. Take 1 capsule (300 mg) by mouth in the morning, take 1 capsule (300 mg) by mouth at noon, and 2 capsules (600 mg) by mouth in the evening     insulin detemir (LEVEMIR) 100 UNIT/ML injection Inject 0.06 mLs (6 Units total) into the skin at bedtime. 10 mL 0   pravastatin (PRAVACHOL) 20 MG tablet Take 1 tablet (20 mg total) by mouth daily with supper. 90 tablet 3   rivaroxaban (XARELTO) 20 MG TABS tablet Take 1 tablet (20  mg total) by mouth daily with supper. 90 tablet 1   tiZANidine (ZANAFLEX) 2 MG tablet Take 2 mg by mouth every 12 (twelve) hours.     traMADol (ULTRAM) 50 MG tablet Take 50 mg by mouth every 12 (twelve) hours.     No facility-administered medications prior to visit.    Allergies  Allergen Reactions   Penicillins Hives and Swelling    As a child. Has patient had a PCN reaction causing immediate rash, facial/tongue/throat swelling, SOB or lightheadedness with hypotension: YES  Has patient had a PCN reaction causing severe rash involving mucus membranes or skin necrosis: UNKNOWN Has patient had a PCN reaction that required hospitalization: UNKNOWN Has patient had a PCN reaction occurring within the last 10 years: NO   Aspirin Nausea Only   Ibuprofen Nausea Only   Lyrica [Pregabalin] Nausea Only   Acetaminophen Nausea Only    ROS Review of Systems  Constitutional:  Negative for chills and fever.  Respiratory:  Positive for shortness of breath. Negative for cough.   Cardiovascular:  Positive for leg swelling. Negative for chest pain.  Skin:  Positive for color change and wound.     Objective:    Physical Exam Constitutional:      Appearance: Normal appearance.  HENT:     Head: Normocephalic and atraumatic.  Eyes:     Conjunctiva/sclera: Conjunctivae normal.  Cardiovascular:     Rate and Rhythm: Normal rate  and regular rhythm.  Pulmonary:     Effort: Pulmonary effort is normal.     Breath sounds: Normal breath sounds.  Musculoskeletal:        General: Tenderness present.     Right lower leg: No edema.     Left lower leg: Edema present.  Skin:    General: Skin is warm and dry.     Comments: Wound closed and healed, no signs infection.   Neurological:     General: No focal deficit present.     Mental Status: He is alert. Mental status is at baseline.  Psychiatric:        Mood and Affect: Mood normal.        Behavior: Behavior normal.    BP 128/82    Pulse 96    Temp 98.3 F (36.8 C)    Resp 16    Ht 5' 11" (1.803 m)    Wt 178 lb 4.8 oz (80.9 kg)    SpO2 97%    BMI 24.87 kg/m  Wt Readings from Last 3 Encounters:  08/11/21 177 lb 3.2 oz (80.4 kg)  08/10/21 173 lb (78.5 kg)  07/13/21 172 lb (78 kg)     Health Maintenance Due  Topic Date Due   OPHTHALMOLOGY EXAM  Never done   COLONOSCOPY (Pts 45-53yr Insurance coverage will need to be confirmed)  09/07/2018   COVID-19 Vaccine (4 - Booster for Pfizer series) 09/29/2020   URINE MICROALBUMIN  09/15/2021    There are no preventive care reminders to display for this patient.  Lab Results  Component Value Date   TSH 1.706 04/19/2021   Lab Results  Component Value Date   WBC 6.7 05/25/2021   HGB 15.2 05/25/2021   HCT 43.3 05/25/2021   MCV 90 05/25/2021   PLT 167 05/25/2021   Lab Results  Component Value Date   NA 140 05/25/2021   K 4.3 05/25/2021   CO2 25 05/25/2021   GLUCOSE 143 (H) 05/25/2021   BUN 11 05/25/2021   CREATININE 0.77 05/25/2021  BILITOT 1.2 09/15/2020   ALKPHOS 104 03/30/2017   AST 17 09/15/2020   ALT 14 09/15/2020   PROT 6.5 09/15/2020   ALBUMIN 4.1 03/30/2017   CALCIUM 8.8 05/25/2021   ANIONGAP 11 04/21/2021   EGFR 99 05/25/2021   Lab Results  Component Value Date   CHOL 121 09/15/2020   Lab Results  Component Value Date   HDL 51 09/15/2020   Lab Results  Component Value Date   LDLCALC  54 09/15/2020   Lab Results  Component Value Date   TRIG 76 09/15/2020   Lab Results  Component Value Date   CHOLHDL 2.4 09/15/2020   Lab Results  Component Value Date   HGBA1C 7.6 (A) 07/13/2021      Assessment & Plan:   1. Left leg swelling: US Doppler to rule out DVT today, blood work including CBC, BMP and BNP today as well. Discussed lifestyle management with compression stockings, monitor fluid and salt intake. Lasix 20 mg prescribes to take as needed for swelling. Follow up in 1 month.   - CBC w/Diff/Platelet - Basic Metabolic Panel (BMET) - B Nat Peptide - furosemide (LASIX) 20 MG tablet; Take 1 tablet (20 mg total) by mouth daily.  Dispense: 30 tablet; Refill: 1 - US Venous Img Lower Unilateral Left; Future   Follow-up: Return in about 4 weeks (around 09/15/2021).    Teodora Medici, DO

## 2021-08-18 NOTE — Telephone Encounter (Signed)
Call received from [facility] at [facility] to report venous ultrasound is negative. Patient permitted to leave . Please advise for any further message to patient .

## 2021-08-19 LAB — CBC WITH DIFFERENTIAL/PLATELET
Absolute Monocytes: 576 cells/uL (ref 200–950)
Basophils Absolute: 34 cells/uL (ref 0–200)
Basophils Relative: 0.5 %
Eosinophils Absolute: 161 cells/uL (ref 15–500)
Eosinophils Relative: 2.4 %
HCT: 43.9 % (ref 38.5–50.0)
Hemoglobin: 14.9 g/dL (ref 13.2–17.1)
Lymphs Abs: 1829 cells/uL (ref 850–3900)
MCH: 31.4 pg (ref 27.0–33.0)
MCHC: 33.9 g/dL (ref 32.0–36.0)
MCV: 92.6 fL (ref 80.0–100.0)
MPV: 10.2 fL (ref 7.5–12.5)
Monocytes Relative: 8.6 %
Neutro Abs: 4100 cells/uL (ref 1500–7800)
Neutrophils Relative %: 61.2 %
Platelets: 184 10*3/uL (ref 140–400)
RBC: 4.74 10*6/uL (ref 4.20–5.80)
RDW: 12 % (ref 11.0–15.0)
Total Lymphocyte: 27.3 %
WBC: 6.7 10*3/uL (ref 3.8–10.8)

## 2021-08-19 LAB — BASIC METABOLIC PANEL
BUN: 11 mg/dL (ref 7–25)
CO2: 31 mmol/L (ref 20–32)
Calcium: 9.1 mg/dL (ref 8.6–10.3)
Chloride: 102 mmol/L (ref 98–110)
Creat: 0.79 mg/dL (ref 0.70–1.35)
Glucose, Bld: 157 mg/dL — ABNORMAL HIGH (ref 65–99)
Potassium: 3.8 mmol/L (ref 3.5–5.3)
Sodium: 141 mmol/L (ref 135–146)

## 2021-08-19 LAB — BRAIN NATRIURETIC PEPTIDE: Brain Natriuretic Peptide: 40 pg/mL (ref <100)

## 2021-08-26 ENCOUNTER — Encounter (HOSPITAL_COMMUNITY): Admission: RE | Payer: PPO | Source: Home / Self Care

## 2021-08-26 ENCOUNTER — Ambulatory Visit (HOSPITAL_COMMUNITY): Admission: RE | Admit: 2021-08-26 | Payer: PPO | Source: Home / Self Care | Admitting: Cardiology

## 2021-08-26 SURGERY — ATRIAL FIBRILLATION ABLATION
Anesthesia: General

## 2021-08-30 ENCOUNTER — Other Ambulatory Visit: Payer: PPO

## 2021-08-30 ENCOUNTER — Ambulatory Visit: Payer: PPO

## 2021-08-31 DIAGNOSIS — H2513 Age-related nuclear cataract, bilateral: Secondary | ICD-10-CM | POA: Diagnosis not present

## 2021-08-31 LAB — HM DIABETES EYE EXAM

## 2021-09-12 DIAGNOSIS — G894 Chronic pain syndrome: Secondary | ICD-10-CM | POA: Diagnosis not present

## 2021-09-16 ENCOUNTER — Other Ambulatory Visit: Payer: Self-pay | Admitting: Family Medicine

## 2021-09-16 DIAGNOSIS — E1142 Type 2 diabetes mellitus with diabetic polyneuropathy: Secondary | ICD-10-CM

## 2021-09-16 DIAGNOSIS — Z794 Long term (current) use of insulin: Secondary | ICD-10-CM

## 2021-09-19 ENCOUNTER — Other Ambulatory Visit: Payer: Self-pay

## 2021-09-19 ENCOUNTER — Ambulatory Visit (INDEPENDENT_AMBULATORY_CARE_PROVIDER_SITE_OTHER): Payer: PPO | Admitting: Internal Medicine

## 2021-09-19 ENCOUNTER — Encounter: Payer: Self-pay | Admitting: Internal Medicine

## 2021-09-19 VITALS — BP 136/82 | HR 96 | Temp 98.1°F | Resp 16 | Ht 71.0 in | Wt 174.0 lb

## 2021-09-19 DIAGNOSIS — Z1211 Encounter for screening for malignant neoplasm of colon: Secondary | ICD-10-CM

## 2021-09-19 DIAGNOSIS — E1142 Type 2 diabetes mellitus with diabetic polyneuropathy: Secondary | ICD-10-CM | POA: Diagnosis not present

## 2021-09-19 DIAGNOSIS — Z794 Long term (current) use of insulin: Secondary | ICD-10-CM | POA: Diagnosis not present

## 2021-09-19 DIAGNOSIS — I4819 Other persistent atrial fibrillation: Secondary | ICD-10-CM | POA: Diagnosis not present

## 2021-09-19 DIAGNOSIS — E785 Hyperlipidemia, unspecified: Secondary | ICD-10-CM | POA: Diagnosis not present

## 2021-09-19 DIAGNOSIS — M7989 Other specified soft tissue disorders: Secondary | ICD-10-CM | POA: Diagnosis not present

## 2021-09-19 DIAGNOSIS — Z23 Encounter for immunization: Secondary | ICD-10-CM

## 2021-09-19 MED ORDER — LANCET DEVICES MISC: 1.0000 | Freq: Three times a day (TID) | 12 refills | Status: AC | PRN

## 2021-09-19 MED ORDER — BLOOD GLUCOSE MONITOR KIT: PACK | 0 refills | Status: DC

## 2021-09-19 MED ORDER — INSULIN DETEMIR 100 UNIT/ML ~~LOC~~ SOLN: 8.0000 [IU] | Freq: Every day | SUBCUTANEOUS | 0 refills | Status: DC

## 2021-09-19 MED ORDER — FUROSEMIDE 20 MG PO TABS: 20.0000 mg | ORAL_TABLET | Freq: Every day | ORAL | 1 refills | Status: DC

## 2021-09-19 MED ORDER — GLUCOSE BLOOD VI STRP: ORAL_STRIP | 12 refills | Status: DC

## 2021-09-19 NOTE — Patient Instructions (Addendum)
It was great seeing you today!  Plan discussed at today's visit: -Continue with elevation, rest and Lasix as needed. Continue to keep skin moisturized.  -Refills of insulin and diabetic supplies sent -GI referral for colonoscopy today as well -Prevnar 20 vaccine given today  Follow up in: March already scheduled  Take care and let us know if you have any questions or concerns prior to your next visit.  Dr. Rosana Berger

## 2021-09-19 NOTE — Progress Notes (Signed)
Established Patient Office Visit  Subjective:  Patient ID: Mark Nash, male    DOB: Jan 12, 1956  Age: 66 y.o. MRN: [id number]  CC:  Chief Complaint  Patient presents with   Follow-up   Medication Refill   Diabetes   Hypertension   Hyperlipidemia    HPI Mark Nash presents for follow up on left leg swelling. He had some trauma to the area and had a small wound with surrounding erythema. He was started on Doxycycline but wound culture came back negative and this was discontinued. At his last visit his continued to have left lower extremity edema. Venous doppler was obtained, which was negative. BNP normal, BMP and CBC normal as well. He was started on Lasix 20 mg as needed for swelling. Today he states that rest over the last 5 hours and elevation has improved his swelling. He did take the Lasix but didn't noticed it helped much. He did wear the compression stocking a few times but noted this was uncomfortable. He is using moisturizer on his legs, no new wounds, warmth, pain or drainage from the leg, no fevers.   Diabetes, Type 2: -Last A1c 11/22 7.6 -Medications: Levemir 7.5 units -Patient is compliant with the above medications and reports no side effects.  -Checking BG at home: not checking - no supplies  -Eye exam: had one recently within the last month - planning on cataract surgery soon  -Foot exam: Follows with Podiatry Dr. Luana Shu 2 months  -Statin: yes -PNA vaccine: up to date -Denies symptoms of hypoglycemia, polyuria, polydipsia, numbness extremities, foot ulcers/trauma.   A.Fib: -Medications: Xarelto 20, Diltiazem 240 - planning an ablation at some point, seeing Cardiology in June  -Patient is compliant with above medications and reports no side effects. -Has occasional palpitations, denies chest pain, SOB  HLD: -Medications: Pravastatin 20 -Patient is compliant with above medications and reports no side effects.  -Last lipid panel: 1/22: TC 121, HDL 51,  triglycerides 76, LDL 54  Health Maintenance: -Blood work up to date -Due for repeat colonoscopy -Due for Prevnar 20  Past Medical History:  Diagnosis Date   Arthritis    Asthma    Benign neoplasm of ascending colon    polyps   Constipation    COPD (chronic obstructive pulmonary disease) (HCC)    NO INHALER   Diabetes mellitus without complication (HCC)    Type 2   Diverticulosis    Dyspnea    RARE-WITH EXERTION DUE TO COPD   GERD (gastroesophageal reflux disease)    Hepatitis C    History of kidney stones    H/O   Hyperlipidemia    Hypertension 06/10/2018   currently not under control   Left inguinal hernia    Peripheral vascular disease (HCC)    Restless legs    Schizoaffective disorder (HCC)    Pt denies   Thrombocytopenia ([place])    Vitamin D deficiency     Past Surgical History:  Procedure Laterality Date   BACK SURGERY     CARDIOVERSION N/A 05/20/2021   Procedure: CARDIOVERSION;  Surgeon: Wellington Hampshire, MD;  Location: [facility];  Service: Cardiovascular;  Laterality: N/A;   COLONOSCOPY WITH PROPOFOL N/A 09/07/2017   Procedure: COLONOSCOPY WITH PROPOFOL;  Surgeon: Virgel Manifold, MD;  Location: [facility];  Service: Endoscopy;  Laterality: N/A;   ESOPHAGOGASTRODUODENOSCOPY (EGD) WITH PROPOFOL N/A 12/07/2017   Procedure: ESOPHAGOGASTRODUODENOSCOPY (EGD) WITH PROPOFOL;  Surgeon: Virgel Manifold, MD;  Location: [facility];  Service: Endoscopy;  Laterality: N/A;   FLEXIBLE SIGMOIDOSCOPY N/A 11/09/2017   Procedure: FLEXIBLE SIGMOIDOSCOPY;  Surgeon: Virgel Manifold, MD;  Location: [facility];  Service: Endoscopy;  Laterality: N/A;   HERNIA REPAIR     inguinal   INGUINAL HERNIA REPAIR Bilateral 06/21/2018   Procedure: LAPAROSCOPIC BILATERAL INGUINAL HERNIA REPAIR;  Surgeon: Vickie Epley, MD;  Location: [facility];  Service: General;  Laterality: Bilateral;   L3 TO L5 LAMINECTOMY FOR DECOMPRESSION  07/17/2016   [facility]   REPAIR OF CEREBROSPINAL FLUID LEAK N/A 09/08/2016   Procedure: Lumbar wound exploration, repair of pseudomenigocele;  Surgeon: Kevan Ny Ditty, MD;  Location: [facility];  Service: Neurosurgery;  Laterality: N/A;  Lumbar wound exploration, repair of pseudomenigocele   SINUSOTOMY      Family History  Problem Relation Age of Onset   Heart failure Mother    Hypertension Mother    Diverticulitis Mother    Colitis Mother    Diabetes Father    Hypertension Father    Heart disease Father    Diabetes Sister    Heart failure Brother    Diverticulitis Brother     Social History   Socioeconomic History   Marital status: Divorced    Spouse name: Not on file   Number of children: 2   Years of education: Not on file   Highest education level: 12th grade  Occupational History   Occupation: Disabled  Tobacco Use   Smoking status: Every Day    Packs/day: 1.50    Years: 46.00    Pack years: 69.00    Types: Cigarettes   Smokeless tobacco: Never  Vaping Use   Vaping Use: Never used  Substance and Sexual Activity   Alcohol use: No    Alcohol/week: 0.0 standard drinks   Drug use: No   Sexual activity: Not Currently  Other Topics Concern   Not on file  Social History Narrative   Lives alone   Social Determinants of Health   Financial Resource Strain: High Risk   Difficulty of Paying Living Expenses: Hard  Food Insecurity: No Food Insecurity   Worried About Running Out of Food in the Last Year: Never true   Ran Out of Food in the Last Year: Never true  Transportation Needs: No Transportation Needs   Lack of Transportation (Medical): No   Lack of Transportation (Non-Medical): No  Physical Activity: Insufficiently Active   Days of Exercise per Week: 2 days   Minutes of Exercise per Session: 20 min  Stress: No Stress Concern Present   Feeling of Stress : Not at all  Social Connections: Socially Isolated   Frequency of Communication with Friends and Family: More than three  times a week   Frequency of Social Gatherings with Friends and Family: Three times a week   Attends Religious Services: Never   Active Member of Clubs or Organizations: No   Attends Archivist Meetings: Never   Marital Status: Divorced  Human resources officer Violence: Not At Risk   Fear of Current or Ex-Partner: No   Emotionally Abused: No   Physically Abused: No   Sexually Abused: No    Outpatient Medications Prior to Visit  Medication Sig Dispense Refill   Continuous Blood Gluc Receiver (FREESTYLE LIBRE 2 READER) DEVI Use to check glucose continuously 1 each 0   Continuous Blood Gluc Sensor (FREESTYLE LIBRE 2 SENSOR) MISC Place 1 sensor on the skin every 14 days. Use to check glucose continuously 2 each 11  diltiazem (DILACOR XR) 240 MG 24 hr capsule Take 1 capsule (240 mg total) by mouth daily. 90 capsule 3   ergocalciferol (VITAMIN D2) 1.25 MG (50000 UT) capsule Take 1 capsule (50,000 Units total) by mouth every 14 (fourteen) days. 6 capsule 3   Fluticasone-Umeclidin-Vilant (TRELEGY ELLIPTA) 100-62.5-25 MCG/ACT AEPB Inhale 1 puff into the lungs daily. 3 each 3   furosemide (LASIX) 20 MG tablet Take 1 tablet (20 mg total) by mouth daily. 30 tablet 1   gabapentin (NEURONTIN) 300 MG capsule Take 300-600 mg by mouth See admin instructions. Take 1 capsule (300 mg) by mouth in the morning, take 1 capsule (300 mg) by mouth at noon, and 2 capsules (600 mg) by mouth in the evening     insulin detemir (LEVEMIR) 100 UNIT/ML injection Inject 0.06 mLs (6 Units total) into the skin at bedtime. 10 mL 0   pravastatin (PRAVACHOL) 20 MG tablet Take 1 tablet (20 mg total) by mouth daily with supper. 90 tablet 3   rivaroxaban (XARELTO) 20 MG TABS tablet Take 1 tablet (20 mg total) by mouth daily with supper. 90 tablet 1   tiZANidine (ZANAFLEX) 2 MG tablet Take 2 mg by mouth every 12 (twelve) hours.     traMADol (ULTRAM) 50 MG tablet Take 50 mg by mouth every 12 (twelve) hours.     No  facility-administered medications prior to visit.    Allergies  Allergen Reactions   Penicillins Hives and Swelling    As a child. Has patient had a PCN reaction causing immediate rash, facial/tongue/throat swelling, SOB or lightheadedness with hypotension: YES  Has patient had a PCN reaction causing severe rash involving mucus membranes or skin necrosis: UNKNOWN Has patient had a PCN reaction that required hospitalization: UNKNOWN Has patient had a PCN reaction occurring within the last 10 years: NO   Aspirin Nausea Only   Ibuprofen Nausea Only   Lyrica [Pregabalin] Nausea Only   Acetaminophen Nausea Only    ROS Review of Systems  Constitutional:  Negative for chills and fever.  Eyes:  Negative for visual disturbance.  Respiratory:  Negative for cough and shortness of breath.   Cardiovascular:  Positive for palpitations and leg swelling. Negative for chest pain.  Gastrointestinal:  Negative for abdominal pain.  Skin:  Positive for color change.  Neurological:  Negative for dizziness and headaches.     Objective:    Physical Exam Constitutional:      Appearance: Normal appearance.  HENT:     Head: Normocephalic and atraumatic.  Eyes:     Conjunctiva/sclera: Conjunctivae normal.  Cardiovascular:     Rate and Rhythm: Normal rate. Rhythm irregular.  Pulmonary:     Effort: Pulmonary effort is normal.     Breath sounds: Normal breath sounds.  Musculoskeletal:     Left lower leg: Edema present.     Comments: 1+ pitting edema in LLE with chronic venous stasis skin changes bilaterally, no wounds or signs of current infection  Skin:    General: Skin is warm and dry.  Neurological:     General: No focal deficit present.     Mental Status: He is alert. Mental status is at baseline.  Psychiatric:        Mood and Affect: Mood normal.        Behavior: Behavior normal.    BP 136/82    Pulse 96    Temp 98.1 F (36.7 C)    Resp 16    Ht _0  (1.803 m)  Wt 174 lb (78.9 kg)     SpO2 98%    BMI 24.27 kg/m  Wt Readings from Last 3 Encounters:  09/19/21 174 lb (78.9 kg)  08/18/21 178 lb 4.8 oz (80.9 kg)  08/11/21 177 lb 3.2 oz (80.4 kg)     Health Maintenance Due  Topic Date Due   OPHTHALMOLOGY EXAM  Never done   COLONOSCOPY (Pts 45-56yr Insurance coverage will need to be confirmed)  09/07/2018   COVID-19 Vaccine (4 - Booster for Pfizer series) 09/29/2020   Zoster Vaccines- Shingrix (2 of 2) 09/17/2021   URINE MICROALBUMIN  09/15/2021    There are no preventive care reminders to display for this patient.  Lab Results  Component Value Date   TSH 1.706 04/19/2021   Lab Results  Component Value Date   WBC 6.7 08/18/2021   HGB 14.9 08/18/2021   HCT 43.9 08/18/2021   MCV 92.6 08/18/2021   PLT 184 08/18/2021   Lab Results  Component Value Date   NA 141 08/18/2021   K 3.8 08/18/2021   CO2 31 08/18/2021   GLUCOSE 157 (H) 08/18/2021   BUN 11 08/18/2021   CREATININE 0.79 08/18/2021   BILITOT 1.2 09/15/2020   ALKPHOS 104 03/30/2017   AST 17 09/15/2020   ALT 14 09/15/2020   PROT 6.5 09/15/2020   ALBUMIN 4.1 03/30/2017   CALCIUM 9.1 08/18/2021   ANIONGAP 11 04/21/2021   EGFR 99 05/25/2021   Lab Results  Component Value Date   CHOL 121 09/15/2020   Lab Results  Component Value Date   HDL 51 09/15/2020   Lab Results  Component Value Date   LDLCALC 54 09/15/2020   Lab Results  Component Value Date   TRIG 76 09/15/2020   Lab Results  Component Value Date   CHOLHDL 2.4 09/15/2020   Lab Results  Component Value Date   HGBA1C 7.6 (A) 07/13/2021      Assessment & Plan:   1. Type 2 diabetes mellitus with diabetic polyneuropathy, with long-term current use of insulin ([place]: Stable, last A1c in November. Refill insulin today. Not doing well with FreeStyle Libre, attachment site bothers the patient so he hasn't been using it but has no supplies to check sugars so supplies sent to pharmacy.   - insulin detemir (LEVEMIR) 100 UNIT/ML  injection; Inject 0.08 mLs (8 Units total) into the skin at bedtime.  Dispense: 10 mL; Refill: 0 - blood glucose meter kit and supplies KIT; Dispense based on patient and insurance preference. Use up to tid E11.65, LON:99  Dispense: 1 each; Refill: 0 - glucose blood test strip; What ins. Will cover E11.65, LON:99 tid  Dispense: 100 each; Refill: 12  2. Left leg swelling: Swelling much better, refill Lasix to take as needed. Continue to elevate, use compression stockings and keep skin moisturized.   - furosemide (LASIX) 20 MG tablet; Take 1 tablet (20 mg total) by mouth daily.  Dispense: 30 tablet; Refill: 1  3. Persistent atrial fibrillation ([place]: Stable, continue current medications, planning on ablation sometime in the next 6 months.   4. Dyslipidemia: Stable, continue statin.  5. Screening for colon cancer: Overdue for repeat colonoscopy, referral ordered today.   - Ambulatory referral to Gastroenterology  6. Vaccine for streptococcus pneumoniae and influenza: Prevnar 20 administered today.  - Pneumococcal conjugate vaccine 20-valent (Prevnar 20)   Follow-up: Return for already scheduled .    ETeodora Medici DO

## 2021-09-20 ENCOUNTER — Telehealth: Payer: Self-pay | Admitting: Internal Medicine

## 2021-09-20 ENCOUNTER — Other Ambulatory Visit: Payer: Self-pay

## 2021-09-20 DIAGNOSIS — Z86010 Personal history of colonic polyps: Secondary | ICD-10-CM

## 2021-09-20 MED ORDER — NA SULFATE-K SULFATE-MG SULF 17.5-3.13-1.6 GM/177ML PO SOLN: 1.0000 | Freq: Once | ORAL | 0 refills | Status: AC

## 2021-09-20 NOTE — Progress Notes (Signed)
Gastroenterology Pre-Procedure Review  Request Date: 10/18/2021 Requesting Physician: Dr. Marius Ditch  PATIENT REVIEW QUESTIONS: The patient responded to the following health history questions as indicated:    1. Are you having any GI issues? no 2. Do you have a personal history of Polyps? yes (LAST COLONOSCOPY ) 3. Do you have a family history of Colon Cancer or Polyps? no 4. Diabetes Mellitus? yes (TYPE  2) 5. Joint replacements in the past 12 months?no 6. Major health problems in the past 3 months?no 7. Any artificial heart valves, MVP, or defibrillator?no    MEDICATIONS & ALLERGIES:    Patient reports the following regarding taking any anticoagulation/antiplatelet therapy:   Plavix, Coumadin, Eliquis, Xarelto, Lovenox, Pradaxa, Brilinta, or Effient? yes Louanna Raw) Aspirin? no  Patient confirms/reports the following medications:  Current Outpatient Medications  Medication Sig Dispense Refill   blood glucose meter kit and supplies KIT Dispense based on patient and insurance preference. Use up to tid E11.65, LON:99 1 each 0   Continuous Blood Gluc Receiver (FREESTYLE LIBRE 2 READER) DEVI Use to check glucose continuously 1 each 0   Continuous Blood Gluc Sensor (FREESTYLE LIBRE 2 SENSOR) MISC Place 1 sensor on the skin every 14 days. Use to check glucose continuously 2 each 11   diltiazem (DILACOR XR) 240 MG 24 hr capsule Take 1 capsule (240 mg total) by mouth daily. 90 capsule 3   ergocalciferol (VITAMIN D2) 1.25 MG (50000 UT) capsule Take 1 capsule (50,000 Units total) by mouth every 14 (fourteen) days. 6 capsule 3   Fluticasone-Umeclidin-Vilant (TRELEGY ELLIPTA) 100-62.5-25 MCG/ACT AEPB Inhale 1 puff into the lungs daily. 3 each 3   furosemide (LASIX) 20 MG tablet Take 1 tablet (20 mg total) by mouth daily. 30 tablet 1   gabapentin (NEURONTIN) 300 MG capsule Take 300-600 mg by mouth See admin instructions. Take 1 capsule (300 mg) by mouth in the morning, take 1 capsule (300 mg) by mouth at  noon, and 2 capsules (600 mg) by mouth in the evening     glucose blood test strip What ins. Will cover E11.65, LON:99 tid 100 each 12   insulin detemir (LEVEMIR) 100 UNIT/ML injection Inject 0.08 mLs (8 Units total) into the skin at bedtime. 10 mL 0   Lancet Devices MISC 1 each by Does not apply route 3 (three) times daily as needed. What ins will cover dx:E11.65, LON:99 tid 100 each 12   pravastatin (PRAVACHOL) 20 MG tablet Take 1 tablet (20 mg total) by mouth daily with supper. 90 tablet 3   rivaroxaban (XARELTO) 20 MG TABS tablet Take 1 tablet (20 mg total) by mouth daily with supper. 90 tablet 1   tiZANidine (ZANAFLEX) 2 MG tablet Take 2 mg by mouth every 12 (twelve) hours.     traMADol (ULTRAM) 50 MG tablet Take 50 mg by mouth every 12 (twelve) hours.     No current facility-administered medications for this visit.    Patient confirms/reports the following allergies:  Allergies  Allergen Reactions   Penicillins Hives and Swelling    As a child. Has patient had a PCN reaction causing immediate rash, facial/tongue/throat swelling, SOB or lightheadedness with hypotension: YES  Has patient had a PCN reaction causing severe rash involving mucus membranes or skin necrosis: UNKNOWN Has patient had a PCN reaction that required hospitalization: UNKNOWN Has patient had a PCN reaction occurring within the last 10 years: NO   Aspirin Nausea Only   Ibuprofen Nausea Only   Lyrica [Pregabalin] Nausea Only  Acetaminophen Nausea Only    No orders of the defined types were placed in this encounter.   AUTHORIZATION INFORMATION Primary Insurance: 1D#: Group #:  Secondary Insurance: 1D#: Group #:  SCHEDULE INFORMATION: Date:  10/18/2021 Time: Location:[facility]

## 2021-09-20 NOTE — Telephone Encounter (Signed)
° °  Pre-operative Risk Assessment    Patient Name: Mark Nash  DOB: December 21, 1955 MRN: [id number]      Request for Surgical Clearance    Procedure:   Colonoscopy   Date of Surgery:  Clearance 10/18/21                                 Surgeon:  not indicated  Surgeon's Group or Practice Name:  [facility]  Phone number:  [phone] Fax number:  [phone]   Type of Clearance Requested:   - Pharmacy:  Hold Rivaroxaban (Xarelto) instructions    Type of Anesthesia:  Not Indicated   Additional requests/questions:    Manfred Arch   09/20/2021, 2:27 PM

## 2021-09-21 NOTE — Telephone Encounter (Signed)
° °  Name: Mark Nash  DOB: [birth year]  MRN: [id number]   Primary Cardiologist: Ida Rogue, MD  Chart reviewed as part of pre-operative protocol coverage. We have been asked for guidance to hold xarelto. Per our clinical pharmacist:  Procedure: Colonoscopy Date of procedure: 10/18/21   CHA2DS2-VASc Score = 4  This indicates a 4.8% annual risk of stroke.The patient's score is based upon: CHF History: 0 HTN History: 1 Diabetes History: 1 Stroke History: 0 Vascular Disease History: 1 Age Score: 1 Gender Score: 0   CrCl 103 mL/min  Platelet count 184k   Cardioversion completed on 05/20/2021. Prior ablation in December was cancelled, has not been rescheduled yet.   Per office protocol, patient can hold Xarelto for 1-2 days prior to procedure.    I will route this recommendation to the requesting party via Epic fax function and remove from pre-op pool. Please call with questions.  Tami Lin Chrishawn Kring, PA 09/21/2021, 3:17 PM

## 2021-09-21 NOTE — Telephone Encounter (Addendum)
Patient with diagnosis of Afib on Xarelto for anticoagulation.    Procedure: Colonoscopy Date of procedure: 10/18/21  CHA2DS2-VASc Score = 4  This indicates a 4.8% annual risk of stroke.The patient's score is based upon: CHF History: 0 HTN History: 1 Diabetes History: 1 Stroke History: 0 Vascular Disease History: 1 Age Score: 1 Gender Score: 0  CrCl 103 mL/min  Platelet count 184k  Cardioversion completed on 05/20/2021. Prior ablation in December was cancelled, has not been rescheduled yet.  Per office protocol, patient can hold Xarelto for 1-2 days prior to procedure.

## 2021-09-22 ENCOUNTER — Telehealth: Payer: Self-pay

## 2021-09-22 DIAGNOSIS — H2511 Age-related nuclear cataract, right eye: Secondary | ICD-10-CM | POA: Diagnosis not present

## 2021-09-22 NOTE — Telephone Encounter (Signed)
Called patient about his blood thinner and he wanted to cancel procedure and will call us back to reschedule called endo and canceled

## 2021-09-23 ENCOUNTER — Ambulatory Visit (HOSPITAL_COMMUNITY): Payer: PPO | Admitting: Physician Assistant

## 2021-09-23 ENCOUNTER — Ambulatory Visit (HOSPITAL_COMMUNITY): Payer: PPO | Admitting: Nurse Practitioner

## 2021-09-26 ENCOUNTER — Encounter: Payer: Self-pay | Admitting: Ophthalmology

## 2021-09-27 ENCOUNTER — Telehealth: Payer: Self-pay

## 2021-09-27 NOTE — Progress Notes (Signed)
° ° °  Chronic Care Management Pharmacy Assistant   Name: Mark Nash  MRN: [id number] DOB: Jul 24, 1956  Patient called to be reminded of his face to face appointment with [name], CPP on 09/28/2021 @ 1545  Patient aware of appointment date, time, and type of appointment (in person). Patient aware to have/bring all medications, supplements, blood pressure and/or blood sugar logs to visit.  Questions: Are you having any problems obtaining your medications? Not at this time  If patient has any PAP medications ask if they are having any problems getting their PAP medication or refill? Patient did get approved for Trelegy for 2022. They sent him a 90-Day supply that he confirmed receiving, and I advised him that since the year has started over he will need to spend $600 out-of-pocket for medications for 2023 prior to [place] applying for this year. I encouraged patient once he has spent the $600 to call me so I can start the 2023 PAP for him.   Star Rating Drug: Pravastatin 20 mg last filled on 07/13/2021 for a 90-Day supply with [facility]  Any gaps in medications fill history? No  Care Gaps: Diabetic Eye Exam Colonoscopy (Last completed 09/07/2017 Medicare only covers every 10 years unless patient is high risk than it's every 2 years) COVID-19 Booster 4 Urine Microalbumin Zoster Vaccine   Lynann Bologna, CPA/CMA Clinical Pharmacist Assistant Phone: [phone]

## 2021-09-27 NOTE — Telephone Encounter (Signed)
Refaxed blood thinner

## 2021-09-28 ENCOUNTER — Ambulatory Visit (INDEPENDENT_AMBULATORY_CARE_PROVIDER_SITE_OTHER): Payer: PPO

## 2021-09-28 DIAGNOSIS — E1142 Type 2 diabetes mellitus with diabetic polyneuropathy: Secondary | ICD-10-CM

## 2021-09-28 DIAGNOSIS — I10 Essential (primary) hypertension: Secondary | ICD-10-CM

## 2021-09-28 MED ORDER — FREESTYLE PRECISION NEO TEST VI STRP: ORAL_STRIP | 12 refills | Status: DC

## 2021-09-28 NOTE — Progress Notes (Signed)
Chronic Care Management Pharmacy Note  09/28/2021 Name:  Mark Nash MRN:  [id number] DOB:  January 27, 1956  Summary: Patient presents for CCM follow-up. Did not tolerate Freestyle Libre  Recommendations/Changes made from today's visit: Continue current medications  Plan: CPP follow-up in 3 months  Subjective: Mark Nash is an 66 y.o. year old male who is a primary patient of [name], MD.  The CCM team was consulted for assistance with disease management and care coordination needs.    Engaged with patient by telephone for follow up visit in response to provider referral for pharmacy case management and/or care coordination services.   Consent to Services:  The patient was given information about Chronic Care Management services, agreed to services, and gave verbal consent prior to initiation of services.  Please see initial visit note for detailed documentation.   Patient Care Team: [name], MD as PCP - General (Family Medicine) Vickie Epley, MD as PCP - Electrophysiology (Cardiology) Minna Merritts, MD as PCP - Cardiology (Cardiology) Virgel Manifold, MD (Inactive) as Consulting Physician (Gastroenterology) Leim Fabry, MD as Consulting Physician (Orthopedic Surgery) Caroline More, DPM as Consulting Physician (Podiatry) Nathanial Rancher, MD as Referring Physician (Pain Medicine) End, Harrell Gave, MD as Consulting Physician (Cardiology) Germaine Pomfret, [facility] (Pharmacist)  Recent office visits: 09/19/21: Patient presented to Dr. Rosana Berger for follow-up.   08/18/21: Patient presented to Dr. Rosana Berger for follow-up. Furosemide 20 mg daily   08/11/21: Patient presented to Dr. Rosana Berger for follow-up. Doxycycline for cellulitis.   07/13/2021 [name], MD (PCP Office Visit) for Follow-up- Stopped: Doxycycline Hyclate, Metoprolol Tartrate due to patient not taking, Lab order placed, patient instructed to return in 4 months   06/29/2021 [name], MD (PCP Office Visit) for Cellulitis-  Start: Doxycycline Hyclate 100 mg twice daily, Levofloxacin 500 mg daily, no follow-up noted   06/10/2021 [name], MD (PCP Office Visit) for Swollen Calf- No medication changes noted, US Venous lmg Lower Unilateral Right (DVT) ordered, No follow-up noted  Recent consult visits: 06/17/2021 [name], DPM (Podiatry) for Diabetes- No medication changes noted, no orders placed, patient instructed to return in 3 months   05/25/2021 [name], MD (Cardiology) for Persistent AFIB- No medication changes noted, lab order placed, CT Cardiac Morph/PULM Vein/W/CM&W/O, patient instructed to follow-up in 4 weeks   05/06/2021 [name], MD (Cardiology) for Initial Consult- Changed: Erogocalciferol 50,000 Units Oral, Every other week, Gabapentin 300 mg Take one capsule in the morning, one at noon, and 2 capsule in the evening, NM Myocar Multi W/Spect W/Wall Motion, EKG 12-Lead, Cardiac Stress Test   03/08/2021 [name], MD (Rheumatology) for Arthritis- No medication changes noted, lab order placed, patient instructed to follow-up in 1 year  Hospital visits: Admitted to the hospital on 06/09/2021  due to Cardiology. Discharge date was 06/09/2021. Discharged from [facility]?Medications Started at [facility] Discharge:?? -START taking: sulfamethoxazole-trimethoprim (BACTRIM DS) 800-160 MG tablet Take 1 tablet by mouth 2 (two) times daily for 7 days.   Medication Changes at Hospital Discharge: -Changed None ID    Medications Discontinued at Hospital Discharge: -STOP taking: None ID   Medications that remain the same after Hospital Discharge:??  -All other medications will remain the same.     Medication Reconciliation was completed by comparing discharge summary, patients EMR and Pharmacy list, and upon discussion with patient.   Admitted to the hospital on 05/20/2021  due to Cardioversion.  Discharge date was 05/20/2021. Discharged from  [place]?Medications Started at [facility] Discharge:?? -START taking: None ID   Medication Changes at Hospital Discharge: -Changed None ID    Medications Discontinued at Hospital Discharge: -STOP taking: None ID   Medications that remain the same after Hospital Discharge:??  -All other medications will remain the same.     Medication Reconciliation was completed by comparing discharge summary, patients EMR and Pharmacy list, and upon discussion with patient.   Admitted to the hospital on 04/19/2021  due to COPD, HTN, , DM II, Rapid AFIB, AKI,. Discharge date was 04/22/2021. Discharged from [place]?Medications Started at [facility] Discharge:?? -START taking: diltiazem (DILACOR XR) 240 MG 24 hr capsule Take 1 capsule (240 mg total) by mouth daily.   Medication Changes at Hospital Discharge: -Changed None ID    Medications Discontinued at Hospital Discharge: -STOP taking: lisinopril-hydrochlorothiazide 20-12.5 MG tablet (ZESTORETIC) meloxicam 7.5 MG tablet (MOBIC)   Medications that remain the same after Hospital Discharge:??  -All other medications will remain the same.     Objective:  Lab Results  Component Value Date   CREATININE 0.79 08/18/2021   BUN 11 08/18/2021   GFRNONAA >60 04/21/2021   GFRAA 116 09/15/2020   NA 141 08/18/2021   K 3.8 08/18/2021   CALCIUM 9.1 08/18/2021   CO2 31 08/18/2021   GLUCOSE 157 (H) 08/18/2021    Lab Results  Component Value Date/Time   HGBA1C 7.6 (A) 07/13/2021 01:25 PM   HGBA1C 7.3 (A) 03/16/2021 11:14 AM   HGBA1C 7.1 (H) 11/10/2019 02:27 PM   HGBA1C 6.6 07/11/2019 01:45 PM   HGBA1C 6.8 03/10/2019 10:08 AM   MICROALBUR 0.2 09/15/2020 03:00 PM   MICROALBUR 0.3 03/10/2019 10:08 AM   MICROALBUR 50 07/17/2017 02:17 PM   MICROALBUR negative 03/11/2015 10:59 AM    Last diabetic Eye exam:  Lab  Results  Component Value Date/Time   HMDIABEYEEXA No Retinopathy 08/31/2021 12:00 AM    Last diabetic Foot exam:  Lab Results  Component Value Date/Time   HMDIABFOOTEX Normal 02/11/2018 12:00 AM     Lab Results  Component Value Date   CHOL 121 09/15/2020   HDL 51 09/15/2020   LDLCALC 54 09/15/2020   TRIG 76 09/15/2020   CHOLHDL 2.4 09/15/2020    Hepatic Function Latest Ref Rng & Units 09/15/2020 11/10/2019 03/10/2019  Total Protein 6.1 - 8.1 g/dL 6.5 6.3 6.4  Albumin 3.6 - 5.1 g/dL - - -  AST 10 - 35 U/L _0 ALT 9 - 46 U/L _1 Alk Phosphatase 40 - 115 U/L - - -  Total Bilirubin 0.2 - 1.2 mg/dL 1.2 0.9 0.9  Bilirubin, Direct <=0.2 mg/dL - - -    Lab Results  Component Value Date/Time   TSH 1.706 04/19/2021 10:04 PM    CBC Latest Ref Rng & Units 08/18/2021 05/25/2021 04/22/2021  WBC 3.8 - 10.8 Thousand/uL 6.7 6.7 5.3  Hemoglobin 13.2 - 17.1 g/dL 14.9 15.2 15.9  Hematocrit 38.5 - 50.0 % 43.9 43.3 44.3  Platelets 140 - 400 Thousand/uL 184 167 132(L)    Lab Results  Component Value Date/Time   VD25OH 26 (L) 03/30/2017 12:04 PM   VD25OH 26 (L) 12/15/2016 02:05 PM    Clinical ASCVD: No  The ASCVD Risk score (Arnett DK, et al., 2019) failed to calculate for the following reasons:   The valid total cholesterol range is 130 to 320 mg/dL  Depression screen [facility] 2/9 09/19/2021 08/18/2021 08/11/2021  Decreased Interest 0 0 0  Down, Depressed, Hopeless 0 0 0  PHQ - 2 Score 0 0 0  Altered sleeping 0 0 -  Tired, decreased energy 0 0 -  Change in appetite 0 0 -  Feeling bad or failure about yourself  0 0 -  Trouble concentrating 0 0 -  Moving slowly or fidgety/restless 0 0 -  Suicidal thoughts 0 0 -  PHQ-9 Score 0 0 -  Difficult doing work/chores Not difficult at all Not difficult at all -  Some recent data might be hidden    Social History   Tobacco Use  Smoking Status Every Day   Packs/day: 1.50   Years: 46.00   Pack years: 69.00   Types: Cigarettes   Smokeless Tobacco Never   BP Readings from Last 3 Encounters:  09/19/21 136/82  08/18/21 128/82  08/11/21 124/72   Pulse Readings from Last 3 Encounters:  09/19/21 96  08/18/21 96  08/11/21 68   Wt Readings from Last 3 Encounters:  09/19/21 174 lb (78.9 kg)  08/18/21 178 lb 4.8 oz (80.9 kg)  08/11/21 177 lb 3.2 oz (80.4 kg)   BMI Readings from Last 3 Encounters:  09/19/21 24.27 kg/m  08/18/21 24.87 kg/m  08/11/21 24.71 kg/m    Assessment/Interventions: Review of patient past medical history, allergies, medications, health status, including review of consultants reports, laboratory and other test data, was performed as part of comprehensive evaluation and provision of chronic care management services.   SDOH:  (Social Determinants of Health) assessments and interventions performed: Yes    SDOH Screenings   Alcohol Screen: Low Risk    Last Alcohol Screening Score (AUDIT): 0  Depression (PHQ2-9): Low Risk    PHQ-2 Score: 0  Financial Resource Strain: High Risk   Difficulty of Paying Living Expenses: Hard  Food Insecurity: No Food Insecurity   Worried About Charity fundraiser in the Last Year: Never true   Ran Out of Food in the Last Year: Never true  Housing: Low Risk    Last Housing Risk Score: 0  Physical Activity: Insufficiently Active   Days of Exercise per Week: 2 days   Minutes of Exercise per Session: 20 min  Social Connections: Socially Isolated   Frequency of Communication with Friends and Family: More than three times a week   Frequency of Social Gatherings with Friends and Family: Three times a week   Attends Religious Services: Never   Active Member of Clubs or Organizations: No   Attends Archivist Meetings: Never   Marital Status: Divorced  Stress: No Stress Concern Present   Feeling of Stress : Not at all  Tobacco Use: High Risk   Smoking Tobacco Use: Every Day   Smokeless Tobacco Use: Never   Passive Exposure: Not on file   Transportation Needs: No Transportation Needs   Lack of Transportation (Medical): No   Lack of Transportation (Non-Medical): No    CCM Care Plan  Allergies  Allergen Reactions   Penicillins Hives and Swelling    As a child. Has patient had a PCN reaction causing immediate rash, facial/tongue/throat swelling, SOB or lightheadedness with hypotension: YES  Has patient had a PCN reaction causing severe rash involving mucus membranes or skin necrosis: UNKNOWN Has patient had a PCN reaction that required hospitalization: UNKNOWN Has patient had a PCN reaction occurring within the last 10 years: NO   Aspirin Nausea Only   Ibuprofen Nausea  Only   Lyrica [Pregabalin] Nausea Only   Acetaminophen Nausea Only    Medications Reviewed Today     Reviewed by Lady Gary, RN (Registered Nurse) on 09/26/21 at 1554  Med List Status: <None>   Medication Order Taking? Sig Documenting Provider Last Dose Status Informant  blood glucose meter kit and supplies KIT [id number]  Dispense based on patient and insurance preference. Use up to tid E11.65, LON:99 Teodora Medici, DO  Active   Continuous Blood Gluc Receiver (FREESTYLE LIBRE 2 READER) DEVI [id number] No Use to check glucose continuously  Patient not taking: Reported on 09/26/2021   [name], MD Not Taking Active   Continuous Blood Gluc Sensor (FREESTYLE LIBRE 2 SENSOR) MISC [id number] No Place 1 sensor on the skin every 14 days. Use to check glucose continuously  Patient not taking: Reported on 09/26/2021   [name], MD Not Taking Active   diltiazem (DILACOR XR) 240 MG 24 hr capsule [id number] Yes Take 1 capsule (240 mg total) by mouth daily. End, Harrell Gave, MD  Active Self  ergocalciferol (VITAMIN D2) 1.25 MG (50000 UT) capsule [id number] Yes Take 1 capsule (50,000 Units total) by mouth every 14 (fourteen) days. [name], MD  Active   Fluticasone-Umeclidin-[facility] ELLIPTA) 100-62.5-25 MCG/ACT AEPB [id number] Yes  Inhale 1 puff into the lungs daily. [name], MD  Active   furosemide (LASIX) 20 MG tablet [id number] No Take 1 tablet (20 mg total) by mouth daily.  Patient not taking: Reported on 09/26/2021   Teodora Medici, DO Not Taking Active   gabapentin (NEURONTIN) 300 MG capsule [id number] Yes Take 300-600 mg by mouth See admin instructions. Take 1 capsule (300 mg) by mouth in the morning, take 1 capsule (300 mg) by mouth at noon, and 2 capsules (600 mg) by mouth in the evening [provider]  Active Self  glucose blood test strip [id number]  What ins. Will cover E11.65, LON:99 tid Teodora Medici, DO  Active   insulin detemir (LEVEMIR) 100 UNIT/ML injection [id number] Yes Inject 0.08 mLs (8 Units total) into the skin at bedtime. Teodora Medici, DO  Active   [place] [id number]  1 each by Does not apply route 3 (three) times daily as needed. What ins will cover dx:E11.65, LON:99 tid Teodora Medici, DO  Active   pravastatin (PRAVACHOL) 20 MG tablet [id number] Yes Take 1 tablet (20 mg total) by mouth daily with supper. [name], MD  Active   rivaroxaban (XARELTO) 20 MG TABS tablet [id number] Yes Take 1 tablet (20 mg total) by mouth daily with supper. Vickie Epley, MD  Active   tiZANidine (ZANAFLEX) 2 MG tablet [id number] Yes Take 2 mg by mouth every 12 (twelve) hours. Allyne Gee, MD  Active Self  traMADol Veatrice [place]) 50 MG tablet [id number] Yes Take 50 mg by mouth every 12 (twelve) hours. Allyne Gee, MD  Active Self  Med List Note Al Decant, RN 10/03/18 1438): UDS 10/03/18            Patient Active Problem List   Diagnosis Date Noted   Persistent atrial fibrillation ([place]) 05/07/2021   Diabetes mellitus type 2 with peripheral artery disease ([place]) 03/16/2021   DDD (degenerative disc disease), lumbar 03/06/2019   Primary osteoarthritis involving multiple joints 03/06/2019   Lumbar radiculopathy 11/05/2018   Lumbar post-laminectomy syndrome  11/05/2018   Diabetic polyneuropathy associated with type 2 diabetes mellitus ([place]) 11/05/2018   CLE (columnar lined esophagus)    Gastric irritation    Duodenum  ulcer    Personal history of colonic polyps    Polyp of sigmoid colon    Diverticulosis of large intestine without diverticulitis    Benign neoplasm of ascending colon    Osteoarthritis of both hands 09/04/2017   Chronic pain of left knee 09/04/2017   Lymphedema 07/07/2016   Chronic hepatitis C without hepatic coma ([place]) 06/01/2016   Lumbar herniated disc 11/24/2015   Thrombocytopenia (H[facility]) 11/11/2015   Hepatitis C antibody test positive 07/13/2015   Chronic constipation 07/13/2015   CN (constipation) 07/13/2015   Diabetes mellitus with coincident hypertension ([place]) 03/08/2015   Dyslipidemia 03/08/2015   Paranoid schizophrenia ([place]) 03/08/2015   Restless leg 03/08/2015   Callus of foot 03/08/2015   Claudication ([place]) 03/08/2015   Chronic obstructive pulmonary disease ([place]) 03/08/2015   Corn or callus 03/08/2015   Essential (primary) hypertension 03/08/2015   HLD (hyperlipidemia) 03/08/2015   Peripheral vascular disease ([place]) 03/08/2015   Polyneuropathy 03/08/2015   Current tobacco use 03/08/2015   Vitamin D deficiency 04/26/2009   Avitaminosis D 04/26/2009    Immunization History  Administered Date(s) Administered   Fluad Quad(high Dose 65+) 04/28/2021   Influenza Inj Mdck Quad Pf 07/11/2019   Influenza, High Dose Seasonal PF 06/30/2016   Influenza,inj,Quad PF,6+ Mos 07/17/2017, 05/29/2018   Influenza-Unspecified 06/21/2020   PFIZER(Purple Top)SARS-COV-2 Vaccination 11/13/2019, 12/09/2019, 08/04/2020   PNEUMOCOCCAL CONJUGATE-20 09/19/2021   Pneumococcal Conjugate-13 07/13/2015   Pneumococcal Polysaccharide-23 12/23/2009, 03/29/2020   Tdap 12/23/2009, 03/29/2020   Zoster Recombinat (Shingrix) 07/23/2021    Conditions to be addressed/monitored:  Hypertension, Hyperlipidemia, Diabetes, Atrial Fibrillation,  COPD, and Tobacco use  There are no care plans that you recently modified to display for this patient.      Medication Assistance: Application for Trelegy  medication assistance program. in process.  Anticipated assistance start date TBD.  See plan of care for additional detail.  Compliance/Adherence/Medication fill history: Care Gaps: Diabetic Eye Exam Zoster Vaccines Colonoscopy COVID-19 Vaccine  Star-Rating Drugs: Pravastatin 20 mg last filled on 04/06/2021 for a 90-Day supply with [facility]  Patient's preferred pharmacy is:  [facility] [address], [place] - [place] [address] Phone: [phone] Fax: [place], [place] [address] [address] Phone: [phone] Fax: [place] [phone] [place], [place] [address] Phone: [phone] Fax: [phone]  [facility] [id number] - [place] [address] [place] [address] Phone: [phone] Fax: [phone]   Uses pill box? Yes Pt endorses 100% compliance  We discussed: Current pharmacy is preferred with insurance plan and patient is satisfied with pharmacy services Patient decided to: Continue current medication management strategy  Care Plan and Follow Up Patient Decision:  Patient agrees to Care Plan and Follow-up.  Plan: Face to Face appointment with care management team member scheduled for: 12/28/2021 at 2:15 PM  Malva Limes, [place] Pharmacist Practitioner  [facility] [phone]

## 2021-09-28 NOTE — Patient Instructions (Signed)
Visit Information It was great speaking with you today!  Please let me know if you have any questions about our visit.  Plan:  Please let us know once you've spent at least $684 on prescription medications at your pharmacy, so we can start applying for patient assistance for Trelegy and Eliquis  Print copy of patient instructions, educational materials, and care plan provided in person.  Face to Face appointment with pharmacist scheduled for:  12/28/2021 at 2:15 PM  Malva Limes, [place] Pharmacist Practitioner  [facility] [phone]

## 2021-09-29 ENCOUNTER — Other Ambulatory Visit: Payer: Self-pay

## 2021-09-29 ENCOUNTER — Telehealth: Payer: Self-pay

## 2021-09-29 DIAGNOSIS — E1142 Type 2 diabetes mellitus with diabetic polyneuropathy: Secondary | ICD-10-CM

## 2021-09-29 NOTE — Telephone Encounter (Signed)
Received blood thinner clearance back and it says he can stop his xarelto 1-2 days before procedure but patient has canceled procedure

## 2021-09-30 MED ORDER — FREESTYLE PRECISION NEO TEST VI STRP: ORAL_STRIP | 12 refills | Status: DC

## 2021-10-03 DIAGNOSIS — F209 Schizophrenia, unspecified: Secondary | ICD-10-CM | POA: Diagnosis not present

## 2021-10-03 DIAGNOSIS — G894 Chronic pain syndrome: Secondary | ICD-10-CM | POA: Diagnosis not present

## 2021-10-03 DIAGNOSIS — Z720 Tobacco use: Secondary | ICD-10-CM | POA: Diagnosis not present

## 2021-10-03 DIAGNOSIS — M5416 Radiculopathy, lumbar region: Secondary | ICD-10-CM | POA: Diagnosis not present

## 2021-10-03 DIAGNOSIS — E119 Type 2 diabetes mellitus without complications: Secondary | ICD-10-CM | POA: Diagnosis not present

## 2021-10-03 DIAGNOSIS — M5430 Sciatica, unspecified side: Secondary | ICD-10-CM | POA: Diagnosis not present

## 2021-10-03 DIAGNOSIS — I10 Essential (primary) hypertension: Secondary | ICD-10-CM | POA: Diagnosis not present

## 2021-10-03 DIAGNOSIS — Z716 Tobacco abuse counseling: Secondary | ICD-10-CM | POA: Diagnosis not present

## 2021-10-03 DIAGNOSIS — F1120 Opioid dependence, uncomplicated: Secondary | ICD-10-CM | POA: Diagnosis not present

## 2021-10-03 DIAGNOSIS — M961 Postlaminectomy syndrome, not elsewhere classified: Secondary | ICD-10-CM | POA: Diagnosis not present

## 2021-10-03 DIAGNOSIS — G8929 Other chronic pain: Secondary | ICD-10-CM | POA: Diagnosis not present

## 2021-10-03 DIAGNOSIS — M545 Low back pain, unspecified: Secondary | ICD-10-CM | POA: Diagnosis not present

## 2021-10-04 ENCOUNTER — Telehealth: Payer: Self-pay | Admitting: Family Medicine

## 2021-10-04 NOTE — Telephone Encounter (Signed)
Forms were actually competed and faxed back today. Will inform patient. Thanks!

## 2021-10-04 NOTE — Telephone Encounter (Signed)
Pt having trouble getting his DM shoes and inserts.  [facility] told him they need the initial foot exam, which pt states he had in July 13, 2021. Pt would like you to send that information to them so he can get his shoes and inserts. Pt states [place] is supposed to reach out to the office, but he thought he would call too.

## 2021-10-06 NOTE — Discharge Instructions (Signed)

## 2021-10-18 ENCOUNTER — Ambulatory Visit: Admit: 2021-10-18 | Payer: PPO | Admitting: Gastroenterology

## 2021-10-18 SURGERY — COLONOSCOPY WITH PROPOFOL
Anesthesia: General

## 2021-10-25 ENCOUNTER — Telehealth: Payer: Self-pay | Admitting: Family Medicine

## 2021-10-25 DIAGNOSIS — I10 Essential (primary) hypertension: Secondary | ICD-10-CM

## 2021-10-25 DIAGNOSIS — E1142 Type 2 diabetes mellitus with diabetic polyneuropathy: Secondary | ICD-10-CM

## 2021-10-25 NOTE — Telephone Encounter (Unsigned)
Pt has been trying to get diabetic shoes and inserts from [facility] but he was advised that they can not get in touch or information about foot exam from the provider /please advise / [name] stated in 2.7.23 TE that forms were faxed to them but they have stated to pt that they have still not received it /please contact them to confirm Mark Nash Fax# [phone] / phone# [phone]

## 2021-10-25 NOTE — Telephone Encounter (Signed)
Reprinted and refaxed with confirmation

## 2021-10-31 ENCOUNTER — Ambulatory Visit
Admission: RE | Admit: 2021-10-31 | Discharge: 2021-10-31 | Disposition: A | Discharge status: Home / Self Care | Payer: PPO | Attending: Ophthalmology | Admitting: Ophthalmology

## 2021-10-31 ENCOUNTER — Ambulatory Visit: Payer: PPO | Admitting: Anesthesiology

## 2021-10-31 ENCOUNTER — Encounter
Admission: RE | Disposition: A | Discharge status: Home / Self Care | Payer: Self-pay | Source: Home / Self Care | Attending: Ophthalmology

## 2021-10-31 ENCOUNTER — Other Ambulatory Visit: Payer: Self-pay

## 2021-10-31 DIAGNOSIS — H2511 Age-related nuclear cataract, right eye: Secondary | ICD-10-CM | POA: Diagnosis not present

## 2021-10-31 DIAGNOSIS — F259 Schizoaffective disorder, unspecified: Secondary | ICD-10-CM | POA: Diagnosis not present

## 2021-10-31 DIAGNOSIS — Z794 Long term (current) use of insulin: Secondary | ICD-10-CM | POA: Insufficient documentation

## 2021-10-31 DIAGNOSIS — H2181 Floppy iris syndrome: Secondary | ICD-10-CM | POA: Diagnosis not present

## 2021-10-31 DIAGNOSIS — F17210 Nicotine dependence, cigarettes, uncomplicated: Secondary | ICD-10-CM | POA: Insufficient documentation

## 2021-10-31 DIAGNOSIS — I10 Essential (primary) hypertension: Secondary | ICD-10-CM | POA: Insufficient documentation

## 2021-10-31 DIAGNOSIS — Z7951 Long term (current) use of inhaled steroids: Secondary | ICD-10-CM | POA: Insufficient documentation

## 2021-10-31 DIAGNOSIS — E785 Hyperlipidemia, unspecified: Secondary | ICD-10-CM | POA: Diagnosis not present

## 2021-10-31 DIAGNOSIS — Z7901 Long term (current) use of anticoagulants: Secondary | ICD-10-CM | POA: Diagnosis not present

## 2021-10-31 DIAGNOSIS — E1136 Type 2 diabetes mellitus with diabetic cataract: Secondary | ICD-10-CM | POA: Diagnosis not present

## 2021-10-31 DIAGNOSIS — J449 Chronic obstructive pulmonary disease, unspecified: Secondary | ICD-10-CM | POA: Insufficient documentation

## 2021-10-31 DIAGNOSIS — Z79899 Other long term (current) drug therapy: Secondary | ICD-10-CM | POA: Insufficient documentation

## 2021-10-31 DIAGNOSIS — H25811 Combined forms of age-related cataract, right eye: Secondary | ICD-10-CM | POA: Diagnosis not present

## 2021-10-31 DIAGNOSIS — K219 Gastro-esophageal reflux disease without esophagitis: Secondary | ICD-10-CM | POA: Insufficient documentation

## 2021-10-31 HISTORY — PX: CATARACT EXTRACTION W/PHACO: SHX586

## 2021-10-31 HISTORY — DX: Presence of dental prosthetic device (complete) (partial): Z97.2

## 2021-10-31 LAB — GLUCOSE, CAPILLARY
Glucose-Capillary: 103 mg/dL — ABNORMAL HIGH (ref 70–99)
Glucose-Capillary: 118 mg/dL — ABNORMAL HIGH (ref 70–99)

## 2021-10-31 SURGERY — PHACOEMULSIFICATION, CATARACT, WITH IOL INSERTION
Anesthesia: Monitor Anesthesia Care | Site: Eye | Laterality: Right

## 2021-10-31 MED ORDER — SIGHTPATH DOSE#1 SODIUM HYALURONATE 23 MG/ML IO SOLUTION
PREFILLED_SYRINGE | INTRAOCULAR | Status: DC | PRN
  Administered 2021-10-31: 0.6 mL via INTRAOCULAR

## 2021-10-31 MED ORDER — TETRACAINE HCL 0.5 % OP SOLN
1.0000 [drp] | OPHTHALMIC | Status: DC | PRN
  Administered 2021-10-31 (×3): 1 [drp] via OPHTHALMIC

## 2021-10-31 MED ORDER — SIGHTPATH DOSE#1 BSS IO SOLN
INTRAOCULAR | Status: DC | PRN
Start: 2021-10-31 — End: 2021-10-31
  Administered 2021-10-31: 15 mL

## 2021-10-31 MED ORDER — MOXIFLOXACIN HCL 0.5 % OP SOLN
OPHTHALMIC | Status: DC | PRN
  Administered 2021-10-31: 0.2 mL via OPHTHALMIC

## 2021-10-31 MED ORDER — BRIMONIDINE TARTRATE-TIMOLOL 0.2-0.5 % OP SOLN
OPHTHALMIC | Status: DC | PRN
  Administered 2021-10-31: 1 [drp] via OPHTHALMIC

## 2021-10-31 MED ORDER — SIGHTPATH DOSE#1 BSS IO SOLN
INTRAOCULAR | Status: DC | PRN
  Administered 2021-10-31: 95 mL via OPHTHALMIC

## 2021-10-31 MED ORDER — DEXMEDETOMIDINE (PRECEDEX) IN NS 20 MCG/5ML (4 MCG/ML) IV SYRINGE
PREFILLED_SYRINGE | INTRAVENOUS | Status: DC | PRN
  Administered 2021-10-31: 10 ug via INTRAVENOUS

## 2021-10-31 MED ORDER — SIGHTPATH DOSE#1 SODIUM HYALURONATE 10 MG/ML IO SOLUTION
PREFILLED_SYRINGE | INTRAOCULAR | Status: DC | PRN
  Administered 2021-10-31: 0.85 mL via INTRAOCULAR

## 2021-10-31 MED ORDER — MIDAZOLAM HCL 2 MG/2ML IJ SOLN
INTRAMUSCULAR | Status: DC | PRN
Start: 2021-10-31 — End: 2021-10-31
  Administered 2021-10-31: 2 mg via INTRAVENOUS

## 2021-10-31 MED ORDER — ARMC OPHTHALMIC DILATING DROPS
1.0000 "application " | OPHTHALMIC | Status: DC | PRN
  Administered 2021-10-31 (×3): 1 via OPHTHALMIC

## 2021-10-31 MED ORDER — FENTANYL CITRATE (PF) 100 MCG/2ML IJ SOLN
INTRAMUSCULAR | Status: DC | PRN
  Administered 2021-10-31 (×2): 50 ug via INTRAVENOUS

## 2021-10-31 MED ORDER — LACTATED RINGERS IV SOLN: INTRAVENOUS | Status: DC

## 2021-10-31 MED ORDER — LIDOCAINE HCL (PF) 2 % IJ SOLN
INTRAOCULAR | Status: DC | PRN
  Administered 2021-10-31: 1 mL via INTRAOCULAR

## 2021-10-31 SURGICAL SUPPLY — 17 items
CATARACT SUITE SIGHTPATH (MISCELLANEOUS) ×2 IMPLANT
DILATOR PUPIL (MISCELLANEOUS) IMPLANT
DISSECTOR HYDRO NUCLEUS 50X22 (MISCELLANEOUS) ×2 IMPLANT
FEE CATARACT SUITE SIGHTPATH (MISCELLANEOUS) ×1 IMPLANT
GLOVE SURG GAMMEX PI TX LF 7.5 (GLOVE) ×2 IMPLANT
GLOVE SURG SYN 8.5  E (GLOVE) ×1
GLOVE SURG SYN 8.5 E (GLOVE) ×1 IMPLANT
GLOVE SURG SYN 8.5 PF PI (GLOVE) ×1 IMPLANT
LENS IOL TECNIS EYHANCE 22.0 (Intraocular Lens) ×1 IMPLANT
NDL FILTER BLUNT 18X1 1/2 (NEEDLE) ×1 IMPLANT
NEEDLE FILTER BLUNT 18X 1/2SAF (NEEDLE) ×1
NEEDLE FILTER BLUNT 18X1 1/2 (NEEDLE) ×1 IMPLANT
PUPIL DILATOR (MISCELLANEOUS) ×2
RING MALYGIN (MISCELLANEOUS) ×1 IMPLANT
SYR 3ML LL SCALE MARK (SYRINGE) ×2 IMPLANT
SYR 5ML LL (SYRINGE) ×2 IMPLANT
WATER STERILE IRR 250ML POUR (IV SOLUTION) ×2 IMPLANT

## 2021-10-31 NOTE — Anesthesia Procedure Notes (Signed)
Procedure Name: [place] ?Date/Time: 10/31/2021 7:32 AM ?Performed by: Jeannene Patella, CRNA ?Pre-anesthesia Checklist: Patient identified, Emergency Drugs available, Suction available, Timeout performed and Patient being monitored ?Patient Re-evaluated:Patient Re-evaluated prior to induction ?Oxygen Delivery Method: Nasal cannula ?Placement Confirmation: positive ETCO2 ? ? ? ? ?

## 2021-10-31 NOTE — Anesthesia Postprocedure Evaluation (Signed)
Anesthesia Post Note ? ?Patient: Mark Nash ? ?Procedure(s) Performed: CATARACT EXTRACTION PHACO AND INTRAOCULAR LENS PLACEMENT (IOC) RIGHT DIABETIC (Right: Eye) ? ? ?  ?Patient location during evaluation: PACU ?Anesthesia Type: MAC ?Level of consciousness: awake and alert and oriented ?Pain management: satisfactory to patient ?Vital Signs Assessment: post-procedure vital signs reviewed and stable ?Respiratory status: spontaneous breathing, nonlabored ventilation and respiratory function stable ?Cardiovascular status: blood pressure returned to baseline and stable ?Postop Assessment: Adequate PO intake and No signs of nausea or vomiting ?Anesthetic complications: no ? ? ?No notable events documented. ? ?Raliegh Ip ? ? ? ? ? ?

## 2021-10-31 NOTE — Op Note (Signed)
OPERATIVE NOTE ? ?SIM CHOQUETTE ?[id number] ?10/31/2021 ? ? ?PREOPERATIVE DIAGNOSIS:  Nuclear sclerotic cataract right eye.  H25.11 ?  ?POSTOPERATIVE DIAGNOSIS:     ? Nuclear sclerotic cataract right eye.   ?Intraoperative floppy iris syndrome. ?  ?PROCEDURE:  CPT [phone] Complex Phacoemusification with posterior chamber intraocular lens placement of the right eye, requiring placement of a malyugin ring for pupillary expansion ? ?LENS:   ?Implant Name Type Inv. Item Serial No. Manufacturer Lot No. LRB No. Used Action  ?LENS IOL TECNIS EYHANCE 22.0 - [id number] Intraocular Lens LENS IOL TECNIS EYHANCE 22.0 [id number] [employer]  Right 1 Implanted  ?    ? ?Procedure(s) with comments: ?CATARACT EXTRACTION PHACO AND INTRAOCULAR LENS PLACEMENT (IOC) RIGHT DIABETIC (Right) - Diabetic ?4.34 ?00:42.8 ? ?DIB00 +22.0 ?  ?SURGEON:  Benay Pillow, MD, MPH ? ?ANESTHESIOLOGIST: Anesthesiologist: Ronelle Nigh, MD ?CRNA: Jeannene Patella, CRNA ?  ?ANESTHESIA:  Topical with tetracaine drops augmented with 1% preservative-free intracameral lidocaine. ? ?ESTIMATED BLOOD LOSS: less than 1 mL. ?  ?COMPLICATIONS:  None. ?  ?DESCRIPTION OF PROCEDURE:  The patient was identified in the holding room and transported to the operating room and placed in the supine position under the operating microscope.  The right eye was identified as the operative eye and it was prepped and draped in the usual sterile ophthalmic fashion. ?  ?A 1.0 millimeter clear-corneal paracentesis was made at the 10:30 position. 0.5 ml of preservative-free 1% lidocaine with epinephrine was injected into the anterior chamber. ? The anterior chamber was filled with Healon 5 viscoelastic.  A 2.4 millimeter keratome was used to make a near-clear corneal incision at the 8:00 position.   ?The pupil was small and the iris was floppy, so a malyugin ring 6.25 mm was placed. ? ?A curvilinear capsulorrhexis was made with a cystotome and capsulorrhexis forceps.  Balanced salt  solution was used to hydrodissect and hydrodelineate the nucleus. ?  ?Phacoemulsification was then used in stop and chop fashion to remove the lens nucleus and epinucleus.  The remaining cortex was then removed using the irrigation and aspiration handpiece. Healon was then placed into the capsular bag to distend it for lens placement.  A lens was then injected into the capsular bag.   ?The malyugin ring was removed. ?The remaining viscoelastic was aspirated. ?  ?Wounds were hydrated with balanced salt solution.  The anterior chamber was inflated to a physiologic pressure with balanced salt solution.  ? ?Intracameral vigamox 0.1 mL undiluted was injected into the eye and a drop placed onto the ocular surface. ? ?No wound leaks were noted.  The patient was taken to the recovery room in stable condition without complications of anesthesia or surgery ? ?Benay Pillow ?10/31/2021, 7:53 AM ? ?

## 2021-10-31 NOTE — H&P (Signed)
[place]  ? ?Primary Care Physician:  [name], MD ?Ophthalmologist: Dr. Benay Pillow ? ?Pre-Procedure History & Physical: ?HPI:  Mark Nash is a 66 y.o. male here for cataract surgery. ?  ?Past Medical History:  ?Diagnosis Date  ? Arthritis   ? Asthma   ? Benign neoplasm of ascending colon   ? polyps  ? Constipation   ? COPD (chronic obstructive pulmonary disease) ([place])   ? NO INHALER  ? Diabetes mellitus without complication ([place])   ? Type 2  ? Diverticulosis   ? Dyspnea   ? RARE-WITH EXERTION DUE TO COPD  ? GERD (gastroesophageal reflux disease)   ? Hepatitis C   ? History of kidney stones   ? H/O  ? Hyperlipidemia   ? Hypertension 06/10/2018  ? currently not under control  ? Left inguinal hernia   ? Peripheral vascular disease ([place])   ? Restless legs   ? Schizoaffective disorder ([place])   ? Pt denies  ? Thrombocytopenia ([place])   ? Vitamin D deficiency   ? Wears dentures   ? full upper and lower  ? ? ?Past Surgical History:  ?Procedure Laterality Date  ? BACK SURGERY    ? CARDIOVERSION N/A 05/20/2021  ? Procedure: CARDIOVERSION;  Surgeon: Wellington Hampshire, MD;  Location: [facility] ORS;  Service: Cardiovascular;  Laterality: N/A;  ? COLONOSCOPY WITH PROPOFOL N/A 09/07/2017  ? Procedure: COLONOSCOPY WITH PROPOFOL;  Surgeon: Virgel Manifold, MD;  Location: [facility] END[facility];  Service: Endoscopy;  Laterality: N/A;  ? ESOPHAGOGASTRODUODENOSCOPY (EGD) WITH PROPOFOL N/A 12/07/2017  ? Procedure: ESOPHAGOGASTRODUODENOSCOPY (EGD) WITH PROPOFOL;  Surgeon: Virgel Manifold, MD;  Location: [facility];  Service: Endoscopy;  Laterality: N/A;  ? FLEXIBLE SIGMOIDOSCOPY N/A 11/09/2017  ? Procedure: FLEXIBLE SIGMOIDOSCOPY;  Surgeon: Virgel Manifold, MD;  Location: [facility];  Service: Endoscopy;  Laterality: N/A;  ? HERNIA REPAIR    ? inguinal  ? INGUINAL HERNIA REPAIR Bilateral 06/21/2018  ? Procedure: LAPAROSCOPIC BILATERAL INGUINAL HERNIA REPAIR;  Surgeon: Vickie Epley, MD;  Location: [facility]  ORS;  Service: General;  Laterality: Bilateral;  ? L3 TO L5 LAMINECTOMY FOR DECOMPRESSION  07/17/2016  ? [facility]  ? REPAIR OF CEREBROSPINAL FLUID LEAK N/A 09/08/2016  ? Procedure: Lumbar wound exploration, repair of pseudomenigocele;  Surgeon: Kevan Ny Ditty, MD;  Location: [place];  Service: Neurosurgery;  Laterality: N/A;  Lumbar wound exploration, repair of pseudomenigocele  ? SINUSOTOMY    ? ? ?Prior to Admission medications   ?Medication Sig Start Date End Date Taking? Authorizing Provider  ?blood glucose meter kit and supplies KIT Dispense based on patient and insurance preference. Use up to tid E11.65, LON:99 09/19/21  Yes Teodora Medici, DO  ?diltiazem (DILACOR XR) 240 MG 24 hr capsule Take 1 capsule (240 mg total) by mouth daily. 05/06/21 05/01/22 Yes End, Harrell Gave, MD  ?ergocalciferol (VITAMIN D2) 1.25 MG (50000 UT) capsule Take 1 capsule (50,000 Units total) by mouth every 14 (fourteen) days. 07/13/21  Yes Sowles, Drue Stager, MD  ?Fluticasone-Umeclidin-Vilant (TRELEGY ELLIPTA) 100-62.5-25 MCG/ACT AEPB Inhale 1 puff into the lungs daily. 08/03/21  Yes Sowles, Drue Stager, MD  ?gabapentin (NEURONTIN) 300 MG capsule Take 300-600 mg by mouth See admin instructions. Take 1 capsule (300 mg) by mouth in the morning, take 1 capsule (300 mg) by mouth at noon, and 2 capsules (600 mg) by mouth in the evening   Yes [provider]  ?glucose blood (FREESTYLE PRECISION NEO TEST) test strip Check fsbs bid  prn  ?E11.69 ?On insulin 09/30/21  Yes Sowles, Drue Stager, MD  ?insulin detemir (LEVEMIR) 100 UNIT/ML injection Inject 0.08 mLs (8 Units total) into the skin at bedtime. 09/19/21  Yes Teodora Medici, DO  ?Lancet Devices MISC 1 each by Does not apply route 3 (three) times daily as needed. What ins will cover dx:E11.65, LON:99 tid 09/19/21  Yes Teodora Medici, DO  ?pravastatin (PRAVACHOL) 20 MG tablet Take 1 tablet (20 mg total) by mouth daily with supper. 07/13/21  Yes Sowles, Drue Stager, MD   ?rivaroxaban (XARELTO) 20 MG TABS tablet Take 1 tablet (20 mg total) by mouth daily with supper. 06/17/21  Yes Vickie Epley, MD  ?tiZANidine (ZANAFLEX) 2 MG tablet Take 2 mg by mouth every 12 (twelve) hours.   Yes Allyne Gee, MD  ?traMADol (ULTRAM) 50 MG tablet Take 50 mg by mouth every 12 (twelve) hours.   Yes Allyne Gee, MD  ?Continuous Blood Gluc Receiver (FREESTYLE LIBRE 2 READER) DEVI Use to check glucose continuously ?Patient not taking: Reported on 09/26/2021 07/20/21   [name], MD  ?Continuous Blood Gluc Sensor (FREESTYLE LIBRE 2 SENSOR) MISC Place 1 sensor on the skin every 14 days. Use to check glucose continuously ?Patient not taking: Reported on 09/26/2021 07/20/21   [name], MD  ?furosemide (LASIX) 20 MG tablet Take 1 tablet (20 mg total) by mouth daily. ?Patient not taking: Reported on 09/26/2021 09/19/21   Teodora Medici, DO  ? ? ?Allergies as of 09/01/2021 - Review Complete 08/18/2021  ?Allergen Reaction Noted  ? Penicillins Hives and Swelling 03/08/2015  ? Aspirin Nausea Only 03/08/2015  ? Ibuprofen Nausea Only 10/02/2018  ? Lyrica [pregabalin] Nausea Only 04/16/2017  ? Acetaminophen Nausea Only 09/30/2018  ? ? ?Family History  ?Problem Relation Age of Onset  ? Heart failure Mother   ? Hypertension Mother   ? Diverticulitis Mother   ? Colitis Mother   ? Diabetes Father   ? Hypertension Father   ? Heart disease Father   ? Diabetes Sister   ? Heart failure Brother   ? Diverticulitis Brother   ? ? ?Social History  ? ?Socioeconomic History  ? Marital status: Divorced  ?  Spouse name: Not on file  ? Number of children: 2  ? Years of education: Not on file  ? Highest education level: 12th grade  ?Occupational History  ? Occupation: Disabled  ?Tobacco Use  ? Smoking status: Every Day  ?  Packs/day: 1.50  ?  Years: 46.00  ?  Pack years: 69.00  ?  Types: Cigarettes  ? Smokeless tobacco: Never  ?Vaping Use  ? Vaping Use: Never used  ?Substance and Sexual Activity  ? Alcohol use:  No  ?  Alcohol/week: 0.0 standard drinks  ? Drug use: No  ? Sexual activity: Not Currently  ?Other Topics Concern  ? Not on file  ?Social History Narrative  ? Lives alone  ? ?Social Determinants of Health  ? ?Financial Resource Strain: High Risk  ? Difficulty of Paying Living Expenses: Hard  ?Food Insecurity: No Food Insecurity  ? Worried About Charity fundraiser in the Last Year: Never true  ? Ran Out of Food in the Last Year: Never true  ?Transportation Needs: No Transportation Needs  ? Lack of Transportation (Medical): No  ? Lack of Transportation (Non-Medical): No  ?Physical Activity: Insufficiently Active  ? Days of Exercise per Week: 2 days  ? Minutes of Exercise per Session: 20 min  ?Stress: No Stress Concern  Present  ? Feeling of Stress : Not at all  ?Social Connections: Socially Isolated  ? Frequency of Communication with Friends and Family: More than three times a week  ? Frequency of Social Gatherings with Friends and Family: Three times a week  ? Attends Religious Services: Never  ? Active Member of Clubs or Organizations: No  ? Attends Archivist Meetings: Never  ? Marital Status: Divorced  ?Intimate Partner Violence: Not At Risk  ? Fear of Current or Ex-Partner: No  ? Emotionally Abused: No  ? Physically Abused: No  ? Sexually Abused: No  ? ? ?Review of Systems: ?See HPI, otherwise negative ROS ? ?Physical Exam: ?BP (!) 167/84   Pulse 66   Temp 99.3 ?F (37.4 ?C) (Temporal)   Resp 20   Ht '5\' 11"'  (1.803 m)   Wt 77.1 kg   SpO2 98%   BMI 23.71 kg/m?  ?General:   Alert, cooperative in NAD ?Head:  Normocephalic and atraumatic. ?Respiratory:  Normal work of breathing. ?Cardiovascular:  RRR ? ?Impression/Plan: ?Mark Nash is here for cataract surgery. ? ?Risks, benefits, limitations, and alternatives regarding cataract surgery have been reviewed with the patient.  Questions have been answered.  All parties agreeable. ? ? ?Benay Pillow, MD  10/31/2021, 7:17 AM ? ? ?

## 2021-10-31 NOTE — Anesthesia Preprocedure Evaluation (Signed)
Anesthesia Evaluation  ?Patient identified by MRN, date of birth, ID band ?Patient awake ? ? ? ?Reviewed: ?Allergy & Precautions, H&P , NPO status , Patient's Chart, lab work & pertinent test results ? ?Airway ?Mallampati: II ? ?TM Distance: >3 FB ?Neck ROM: full ? ? ? Dental ? ?(+) Edentulous Upper, Edentulous Lower ?  ?Pulmonary ?COPD, Current SmokerPatient did not abstain from smoking.,  ?  ?Pulmonary exam normal ?breath sounds clear to auscultation ? ? ? ? ? ? Cardiovascular ?hypertension, + Peripheral Vascular Disease  ?Normal cardiovascular exam ?Rhythm:regular Rate:Normal ? ? ?  ?Neuro/Psych ?PSYCHIATRIC DISORDERS  Neuromuscular disease   ? GI/Hepatic ?GERD  ,(+) Hepatitis -, C  ?Endo/Other  ?diabetes ? Renal/GU ?  ? ?  ?Musculoskeletal ? ? Abdominal ?  ?Peds ? Hematology ?  ?Anesthesia Other Findings ? ? Reproductive/Obstetrics ? ?  ? ? ? ? ? ? ? ? ? ? ? ? ? ?  ?  ? ? ? ? ? ? ? ? ?Anesthesia Physical ?Anesthesia Plan ? ?ASA: 3 ? ?Anesthesia Plan: MAC  ? ?Post-op Pain Management: Minimal or no pain anticipated  ? ?Induction:  ? ?PONV Risk Score and Plan: 1 and Treatment may vary due to age or medical condition, TIVA and Midazolam ? ?Airway Management Planned:  ? ?Additional Equipment:  ? ?Intra-op Plan:  ? ?Post-operative Plan:  ? ?Informed Consent: I have reviewed the patients History and Physical, chart, labs and discussed the procedure including the risks, benefits and alternatives for the proposed anesthesia with the patient or authorized representative who has indicated his/her understanding and acceptance.  ? ? ? ?Dental Advisory Given ? ?Plan Discussed with: CRNA ? ?Anesthesia Plan Comments:   ? ? ? ? ? ? ?Anesthesia Quick Evaluation ? ?

## 2021-10-31 NOTE — Transfer of Care (Signed)
Immediate Anesthesia Transfer of Care Note ? ?Patient: Mark Nash ? ?Procedure(s) Performed: CATARACT EXTRACTION PHACO AND INTRAOCULAR LENS PLACEMENT (IOC) RIGHT DIABETIC (Right: Eye) ? ?Patient Location: PACU ? ?Anesthesia Type: MAC ? ?Level of Consciousness: awake, alert  and patient cooperative ? ?Airway and Oxygen Therapy: Patient Spontanous Breathing and Patient connected to supplemental oxygen ? ?Post-op Assessment: Post-op Vital signs reviewed, Patient's Cardiovascular Status Stable, Respiratory Function Stable, Patent Airway and No signs of Nausea or vomiting ? ?Post-op Vital Signs: Reviewed and stable ? ?Complications: No notable events documented. ? ?

## 2021-11-01 ENCOUNTER — Encounter: Payer: Self-pay | Admitting: Ophthalmology

## 2021-11-10 NOTE — Progress Notes (Addendum)
Name: Mark Nash   MRN: [id number]    DOB: 03-04-1956   Date:11/11/2021 ? ?     Progress Note ? ?Subjective ? ?Chief Complaint ? ?Follow Up ? ?HPI ? ?Lumbar radiculitis: he had back surgery in 06/2016 and 08/2016. He is currently doing okay. Currently seeing  Dr. Humphrey Rolls and is taking , Tramadol, Tizanidine and higher dose gabapentin 300 mg morning, lunch and 2 at night, his pain  is stable around 5-6/10 , it goes up when more active  ?  ?DMII: he is still on Levemir 7 units, denies hypoglycemia, not checking glucose due to cost of strips , his A1C has been at goal between 7-7.6%. He is on ACE and a statin, and has neuropathy. Pain from neuropathy is stable with Gabapentin three times daily He also has PVD. He tried [employer] but bothered him and he stopped using it  ? ?PAD: he had two episodes of cellulitis or right lower extremity in 2022, earlier this year swelling on left lower leg, and yesterday bumped his leg on brother's wheelchair and has some excoriation. He has not seen vascular doctor in a while and we will send him back for re-evaluation . He is on statin therapy and Xarelto since diagnosis of Afib , he states  pain medication helps and he can walk for 15 -25  minutes  ?  ?COPD: he always has daily morning  productive cough. He has some SOB with activity but doing better since taking Trelegy. He can tell when he skips medication. He is still smoking more now 1.5-2 packs per day, not ready to quit yet  ?  ?Hyperlipidemia: taking Pravastatin 20 mg daily and denies myalgias. Last LDL was 54, recheck labs today  ?  ?HTN: taking Diltiazem for afib and bp, he has intermittent chest pain ( feels like a cramp that only lasts less than a couple of minutes and resolves by itself, not associated with sob or diaphoresis . Discussed when to call 911, specifically if lasts more than 10 minutes, associated with N/V/diaphoresis or radiation to jaw, back or arms. No palpitation, he has SOB that is multifactorial and  present with activity  ?  ?Schizophrenia: denies any hallucinations, no depressed mood or suicidal thoughts or ideation, not on medication and refuses to see Psychiatrist. He states he helps his family.  Mother died in Mar 19, 2023  but still helping his brother. He helped with his mother's state  ?  ?Hepatitis C antibody positive: he has a remote history of drug use from age 45 till 58. Used injectable drug use at the time, he has positive hepatitis C , he went to see GI but did not discuss hepatitis C. He has seen GI, he has a large polyp removed 2019 and was supposed to go back for repeat colonoscopy but he keeps postponing  ?  ?Chronic Constipation: he states bowel movements have improvement   He had EGD and colonoscopy in 2019 , past due for repeat testing secondary to large sigmoid polyp, he states because of COVID-19 ,  last visit he told me he was waiting to have dentures , he states now he wants to hold off until he can have the cardiac ablation before going back to have colonoscopy. Since ablation has been postponed again advised to have colonoscopy  ?  ?Senile purpura: both arms. on both upper extremity, unchanged  ? ?Afib: found on auscultation during office visit , CHADVasc score of 4, he is under the care  of cardiologist  , rate controlled with Diltiazem and takes Xarelto , he has noticed some nose bleeding lately, improves with vaseline , about once a month  ? ?Patient Active Problem List  ? Diagnosis Date Noted  ? Persistent atrial fibrillation ([place]) 05/07/2021  ? Diabetes mellitus type 2 with peripheral artery disease ([place]) 03/16/2021  ? DDD (degenerative disc disease), lumbar 03/06/2019  ? Primary osteoarthritis involving multiple joints 03/06/2019  ? Lumbar radiculopathy 11/05/2018  ? Lumbar post-laminectomy syndrome 11/05/2018  ? Diabetic polyneuropathy associated with type 2 diabetes mellitus ([place]) 11/05/2018  ? CLE (columnar lined esophagus)   ? Gastric irritation   ? Duodenum ulcer   ? Personal  history of colonic polyps   ? Polyp of sigmoid colon   ? Diverticulosis of large intestine without diverticulitis   ? Benign neoplasm of ascending colon   ? Osteoarthritis of both hands 09/04/2017  ? Chronic pain of left knee 09/04/2017  ? Lymphedema 07/07/2016  ? Chronic hepatitis C without hepatic coma ([place]) 06/01/2016  ? Lumbar herniated disc 11/24/2015  ? Thrombocytopenia ([place]) 11/11/2015  ? Hepatitis C antibody test positive 07/13/2015  ? Chronic constipation 07/13/2015  ? CN (constipation) 07/13/2015  ? Diabetes mellitus with coincident hypertension ([place]) 03/08/2015  ? Dyslipidemia 03/08/2015  ? Paranoid schizophrenia ([place]) 03/08/2015  ? Restless leg 03/08/2015  ? Callus of foot 03/08/2015  ? Claudication ([place]) 03/08/2015  ? Chronic obstructive pulmonary disease ([place]) 03/08/2015  ? Corn or callus 03/08/2015  ? Essential (primary) hypertension 03/08/2015  ? HLD (hyperlipidemia) 03/08/2015  ? Peripheral vascular disease ([place]) 03/08/2015  ? Polyneuropathy 03/08/2015  ? Current tobacco use 03/08/2015  ? Vitamin D deficiency 04/26/2009  ? Avitaminosis D 04/26/2009  ? ? ?Past Surgical History:  ?Procedure Laterality Date  ? BACK SURGERY    ? CARDIOVERSION N/A 05/20/2021  ? Procedure: CARDIOVERSION;  Surgeon: Wellington Hampshire, MD;  Location: [facility];  Service: Cardiovascular;  Laterality: N/A;  ? CATARACT EXTRACTION W/PHACO Right 10/31/2021  ? Procedure: CATARACT EXTRACTION PHACO AND INTRAOCULAR LENS PLACEMENT ([place]) RIGHT DIABETIC;  Surgeon: Eulogio Bear, MD;  Location: [facility];  Service: Ophthalmology;  Laterality: Right;  Diabetic ?4.34 ?00:42.8  ? COLONOSCOPY WITH PROPOFOL N/A 09/07/2017  ? Procedure: COLONOSCOPY WITH PROPOFOL;  Surgeon: Virgel Manifold, MD;  Location: [facility];  Service: Endoscopy;  Laterality: N/A;  ? ESOPHAGOGASTRODUODENOSCOPY (EGD) WITH PROPOFOL N/A 12/07/2017  ? Procedure: ESOPHAGOGASTRODUODENOSCOPY (EGD) WITH PROPOFOL;  Surgeon: Virgel Manifold, MD;  Location:  [facility];  Service: Endoscopy;  Laterality: N/A;  ? FLEXIBLE SIGMOIDOSCOPY N/A 11/09/2017  ? Procedure: FLEXIBLE SIGMOIDOSCOPY;  Surgeon: Virgel Manifold, MD;  Location: [facility];  Service: Endoscopy;  Laterality: N/A;  ? HERNIA REPAIR    ? inguinal  ? INGUINAL HERNIA REPAIR Bilateral 06/21/2018  ? Procedure: LAPAROSCOPIC BILATERAL INGUINAL HERNIA REPAIR;  Surgeon: Vickie Epley, MD;  Location: [facility];  Service: General;  Laterality: Bilateral;  ? L3 TO L5 LAMINECTOMY FOR DECOMPRESSION  07/17/2016  ? [facility]  ? REPAIR OF CEREBROSPINAL FLUID LEAK N/A 09/08/2016  ? Procedure: Lumbar wound exploration, repair of pseudomenigocele;  Surgeon: Kevan Ny Ditty, MD;  Location: [facility];  Service: Neurosurgery;  Laterality: N/A;  Lumbar wound exploration, repair of pseudomenigocele  ? SINUSOTOMY    ? ? ?Family History  ?Problem Relation Age of Onset  ? Heart failure Mother   ? Hypertension Mother   ? Diverticulitis Mother   ? Colitis Mother   ? Diabetes Father   ?  Hypertension Father   ? Heart disease Father   ? Diabetes Sister   ? Heart failure Brother   ? Diverticulitis Brother   ? ? ?Social History  ? ?Tobacco Use  ? Smoking status: Every Day  ?  Packs/day: 1.50  ?  Years: 46.00  ?  Pack years: 69.00  ?  Types: Cigarettes  ? Smokeless tobacco: Never  ?Substance Use Topics  ? Alcohol use: No  ?  Alcohol/week: 0.0 standard drinks  ? ? ? ?Current Outpatient Medications:  ?  blood glucose meter kit and supplies KIT, Dispense based on patient and insurance preference. Use up to tid E11.65, LON:99, Disp: 1 each, Rfl: 0 ?  Continuous Blood Gluc Receiver (FREESTYLE LIBRE 2 READER) DEVI, Use to check glucose continuously, Disp: 1 each, Rfl: 0 ?  Continuous Blood Gluc Sensor (FREESTYLE LIBRE 2 SENSOR) MISC, Place 1 sensor on the skin every 14 days. Use to check glucose continuously, Disp: 2 each, Rfl: 11 ?  diltiazem (DILACOR XR) 240 MG 24 hr capsule, Take 1 capsule (240 mg total) by  mouth daily., Disp: 90 capsule, Rfl: 3 ?  ergocalciferol (VITAMIN D2) 1.25 MG (50000 UT) capsule, Take 1 capsule (50,000 Units total) by mouth every 14 (fourteen) days., Disp: 6 capsule, Rfl: 3 ?  [employer]

## 2021-11-11 ENCOUNTER — Encounter: Payer: Self-pay | Admitting: Family Medicine

## 2021-11-11 ENCOUNTER — Ambulatory Visit (INDEPENDENT_AMBULATORY_CARE_PROVIDER_SITE_OTHER): Payer: PPO | Admitting: Family Medicine

## 2021-11-11 VITALS — BP 138/82 | HR 60 | Temp 98.1°F | Resp 16 | Ht 71.0 in | Wt 169.9 lb

## 2021-11-11 DIAGNOSIS — I482 Chronic atrial fibrillation, unspecified: Secondary | ICD-10-CM | POA: Diagnosis not present

## 2021-11-11 DIAGNOSIS — Z794 Long term (current) use of insulin: Secondary | ICD-10-CM | POA: Diagnosis not present

## 2021-11-11 DIAGNOSIS — B182 Chronic viral hepatitis C: Secondary | ICD-10-CM | POA: Diagnosis not present

## 2021-11-11 DIAGNOSIS — E559 Vitamin D deficiency, unspecified: Secondary | ICD-10-CM

## 2021-11-11 DIAGNOSIS — F200 Paranoid schizophrenia: Secondary | ICD-10-CM | POA: Diagnosis not present

## 2021-11-11 DIAGNOSIS — I10 Essential (primary) hypertension: Secondary | ICD-10-CM

## 2021-11-11 DIAGNOSIS — S80812A Abrasion, left lower leg, initial encounter: Secondary | ICD-10-CM

## 2021-11-11 DIAGNOSIS — Z720 Tobacco use: Secondary | ICD-10-CM

## 2021-11-11 DIAGNOSIS — I739 Peripheral vascular disease, unspecified: Secondary | ICD-10-CM | POA: Diagnosis not present

## 2021-11-11 DIAGNOSIS — D692 Other nonthrombocytopenic purpura: Secondary | ICD-10-CM

## 2021-11-11 DIAGNOSIS — E1142 Type 2 diabetes mellitus with diabetic polyneuropathy: Secondary | ICD-10-CM

## 2021-11-11 DIAGNOSIS — J449 Chronic obstructive pulmonary disease, unspecified: Secondary | ICD-10-CM

## 2021-11-12 LAB — COMPLETE METABOLIC PANEL WITH GFR
AG Ratio: 2.1 (calc) (ref 1.0–2.5)
ALT: 10 U/L (ref 9–46)
AST: 10 U/L (ref 10–35)
Albumin: 4.1 g/dL (ref 3.6–5.1)
Alkaline phosphatase (APISO): 113 U/L (ref 35–144)
BUN: 11 mg/dL (ref 7–25)
CO2: 29 mmol/L (ref 20–32)
Calcium: 9 mg/dL (ref 8.6–10.3)
Chloride: 103 mmol/L (ref 98–110)
Creat: 0.85 mg/dL (ref 0.70–1.35)
Globulin: 2 g/dL (calc) (ref 1.9–3.7)
Glucose, Bld: 196 mg/dL — ABNORMAL HIGH (ref 65–99)
Potassium: 3.7 mmol/L (ref 3.5–5.3)
Sodium: 140 mmol/L (ref 135–146)
Total Bilirubin: 0.8 mg/dL (ref 0.2–1.2)
Total Protein: 6.1 g/dL (ref 6.1–8.1)
eGFR: 96 mL/min/{1.73_m2} (ref >=60)

## 2021-11-12 LAB — LIPID PANEL
Cholesterol: 106 mg/dL (ref <200)
HDL: 50 mg/dL (ref >=40)
LDL Cholesterol (Calc): 43 mg/dL (calc)
Non-HDL Cholesterol (Calc): 56 mg/dL (calc) (ref <130)
Total CHOL/HDL Ratio: 2.1 (calc) (ref <5.0)
Triglycerides: 46 mg/dL (ref <150)

## 2021-11-12 LAB — MICROALBUMIN / CREATININE URINE RATIO
Creatinine, Urine: 153 mg/dL (ref 20–320)
Microalb Creat Ratio: 16 mcg/mg creat (ref <30)
Microalb, Ur: 2.4 mg/dL

## 2021-11-12 LAB — HEMOGLOBIN A1C
Hgb A1c MFr Bld: 8.8 % of total Hgb — ABNORMAL HIGH (ref <5.7)
Mean Plasma Glucose: 206 mg/dL
eAG (mmol/L): 11.4 mmol/L

## 2021-11-16 DIAGNOSIS — E1142 Type 2 diabetes mellitus with diabetic polyneuropathy: Secondary | ICD-10-CM | POA: Diagnosis not present

## 2021-11-16 DIAGNOSIS — E1140 Type 2 diabetes mellitus with diabetic neuropathy, unspecified: Secondary | ICD-10-CM | POA: Diagnosis not present

## 2021-11-16 DIAGNOSIS — L851 Acquired keratosis [keratoderma] palmaris et plantaris: Secondary | ICD-10-CM | POA: Diagnosis not present

## 2021-11-16 DIAGNOSIS — B351 Tinea unguium: Secondary | ICD-10-CM | POA: Diagnosis not present

## 2021-11-23 ENCOUNTER — Ambulatory Visit: Payer: PPO | Admitting: Cardiology

## 2021-12-06 ENCOUNTER — Ambulatory Visit: Payer: PPO

## 2021-12-12 DIAGNOSIS — E119 Type 2 diabetes mellitus without complications: Secondary | ICD-10-CM | POA: Diagnosis not present

## 2021-12-12 DIAGNOSIS — Z720 Tobacco use: Secondary | ICD-10-CM | POA: Diagnosis not present

## 2021-12-12 DIAGNOSIS — G8929 Other chronic pain: Secondary | ICD-10-CM | POA: Diagnosis not present

## 2021-12-12 DIAGNOSIS — Z79891 Long term (current) use of opiate analgesic: Secondary | ICD-10-CM | POA: Diagnosis not present

## 2021-12-12 DIAGNOSIS — M545 Low back pain, unspecified: Secondary | ICD-10-CM | POA: Diagnosis not present

## 2021-12-12 DIAGNOSIS — M5416 Radiculopathy, lumbar region: Secondary | ICD-10-CM | POA: Diagnosis not present

## 2021-12-12 DIAGNOSIS — M5430 Sciatica, unspecified side: Secondary | ICD-10-CM | POA: Diagnosis not present

## 2021-12-12 DIAGNOSIS — Z716 Tobacco abuse counseling: Secondary | ICD-10-CM | POA: Diagnosis not present

## 2021-12-12 DIAGNOSIS — M961 Postlaminectomy syndrome, not elsewhere classified: Secondary | ICD-10-CM | POA: Diagnosis not present

## 2021-12-12 DIAGNOSIS — F209 Schizophrenia, unspecified: Secondary | ICD-10-CM | POA: Diagnosis not present

## 2021-12-12 DIAGNOSIS — F1120 Opioid dependence, uncomplicated: Secondary | ICD-10-CM | POA: Diagnosis not present

## 2021-12-21 ENCOUNTER — Telehealth: Payer: Self-pay | Admitting: Family Medicine

## 2021-12-21 NOTE — Telephone Encounter (Signed)
Copied from [place] [id number]. Topic: Medicare AWV ?>> Dec 21, 2021  2:52 PM Cher Nakai R wrote: ?Reason for CRM:  ?Left message for patient to call back and schedule Medicare Annual Wellness Visit (AWV) in office.  ? ?If unable to come into the office for AWV,  please offer to do virtually or by telephone. ? ?Last AWV: 12/02/2020 ? ? ?Please schedule at anytime with [place]. ? ?30 minute appointment for Virtual or phone ?45 minute appointment for in office or Initial virtual/phone ? ?Any questions, please contact me at [phone] ?

## 2021-12-22 ENCOUNTER — Ambulatory Visit: Payer: PPO

## 2021-12-27 ENCOUNTER — Telehealth: Payer: Self-pay

## 2021-12-27 NOTE — Progress Notes (Signed)
? ? ?  Chronic Care Management ?Pharmacy Assistant  ? ?Name: Mark Nash  MRN: [id number] DOB: 05-Apr-1956 ? ?Patient called to be reminded of his face to face appointment with [name], CPP on 05/03/02023 @ 1415. ? ?Patient aware of appointment date, time, and type of appointment (either telephone or in person). Patient aware to have/bring all medications, supplements, blood pressure and/or blood sugar logs to visit. ? ?Star Rating Drug: ?Pravastatin 20 mg last filled on 10/11/2021 for a 90-Day supply with [facility] ? ?Any gaps in medications fill history? NO ? ?Care Gaps: ?COVID-19 Vaccine Booster 4 ?Zoster Vaccines ? ? ?Lynann Bologna, CPA/CMA ?Clinical Pharmacist Assistant ?Phone: [phone]  ? ?

## 2021-12-28 ENCOUNTER — Ambulatory Visit (INDEPENDENT_AMBULATORY_CARE_PROVIDER_SITE_OTHER): Payer: PPO

## 2021-12-28 DIAGNOSIS — E1151 Type 2 diabetes mellitus with diabetic peripheral angiopathy without gangrene: Secondary | ICD-10-CM

## 2021-12-28 DIAGNOSIS — Z720 Tobacco use: Secondary | ICD-10-CM

## 2021-12-28 NOTE — Progress Notes (Signed)
?Chronic Care Management ?Pharmacy Note ? ?01/04/2022 ?Name:  Mark Nash MRN:  [id number] DOB:  Sep 30, 1955 ? ?Summary: ?Patient presents for CCM follow-up. Did not tolerate Freestyle Libre ?-Patient is in a pre-contemplative stage of smoking and is not ready to quit.  ? ?Recommendations/Changes made from today's visit: ?Continue current medications ? ?Plan: ?CPP follow-up in 3 months ? ?Subjective: ?Mark Nash is an 66 y.o. year old male who is a primary patient of [name], MD.  The CCM team was consulted for assistance with disease management and care coordination needs.   ? ?Engaged with patient by telephone for follow up visit in response to provider referral for pharmacy case management and/or care coordination services.  ? ?Consent to Services:  ?The patient was given information about Chronic Care Management services, agreed to services, and gave verbal consent prior to initiation of services.  Please see initial visit note for detailed documentation.  ? ?Patient Care Team: ?[name], MD as PCP - General (Family Medicine) ?Mark Epley, MD as PCP - Electrophysiology (Cardiology) ?Mark Merritts, MD as PCP - Cardiology (Cardiology) ?Mark Manifold, MD (Inactive) as Consulting Physician (Gastroenterology) ?Mark Fabry, MD as Consulting Physician (Orthopedic Surgery) ?Mark Nash, DPM as Consulting Physician (Podiatry) ?Mark Rancher, MD as Referring Physician (Pain Medicine) ?Mark Bush, MD as Consulting Physician (Cardiology) ?Mark Nash, [facility] (Pharmacist) ? ?Recent office visits: ?09/19/21: Patient presented to Dr. Rosana Berger for follow-up.  ? ?08/18/21: Patient presented to Dr. Rosana Berger for follow-up. Furosemide 20 mg daily  ? ?08/11/21: Patient presented to Dr. Rosana Berger for follow-up. Doxycycline for cellulitis.  ? ?07/13/2021 [name], MD (PCP Office Visit) for Follow-up- Stopped: Doxycycline Hyclate, Metoprolol Tartrate due to patient not taking, Lab  order placed, patient instructed to return in 4 months ?  ?06/29/2021 [name], MD (PCP Office Visit) for Cellulitis-  Start: Doxycycline Hyclate 100 mg twice daily, Levofloxacin 500 mg daily, no follow-up noted ?  ?06/10/2021 [name], MD (PCP Office Visit) for Swollen Calf- No medication changes noted, US Venous lmg Lower Unilateral Right (DVT) ordered, No follow-up noted ? ?Recent consult visits: ?06/17/2021 Mark Nash, DPM (Podiatry) for Diabetes- No medication changes noted, no orders placed, patient instructed to return in 3 months ?  ?05/25/2021 Mark Mage, MD (Cardiology) for Persistent AFIB- No medication changes noted, lab order placed, CT Cardiac Morph/PULM Vein/W/CM&W/O, patient instructed to follow-up in 4 weeks ?  ?05/06/2021 Mark Bush, MD (Cardiology) for Initial Consult- Changed: Erogocalciferol 50,000 Units Oral, Every other week, Gabapentin 300 mg Take one capsule in the morning, one at noon, and 2 capsule in the evening, NM Myocar Multi W/Spect W/Wall Motion, EKG 12-Lead, Cardiac Stress Test ?  ?03/08/2021 Mark Stefan Church, MD (Rheumatology) for Arthritis- No medication changes noted, lab order placed, patient instructed to follow-up in 1 year ? ?Hospital visits: ?Admitted to the hospital on 06/09/2021  due to Cardiology. Discharge date was 06/09/2021. Discharged from [facility] ?  ?New?Medications Started at [facility] Discharge:?? ?-START taking: ?sulfamethoxazole-trimethoprim (BACTRIM DS) 800-160 MG tablet Take 1 tablet by mouth 2 ?(two) times daily for 7 days. ?  ?Medication Changes at Hospital Discharge: ?-Changed None ID  ?  ?Medications Discontinued at Hospital Discharge: ?-STOP taking: ?None ID ?  ?Medications that remain the same after Hospital Discharge:??  ?-All other medications will remain the same.   ?  ?Medication Reconciliation was completed by comparing discharge summary, patient?s EMR and Pharmacy list, and upon discussion with  patient. ?  ?Admitted  to the hospital on 05/20/2021  due to Cardioversion. Discharge date was 05/20/2021. Discharged from [facility].   ?  ?New?Medications Started at [facility] Discharge:?? ?-START taking: ?None ID ?  ?Medication Changes at Hospital Discharge: ?-Changed None ID  ?  ?Medications Discontinued at Hospital Discharge: ?-STOP taking: ?None ID ?  ?Medications that remain the same after Hospital Discharge:??  ?-All other medications will remain the same.   ?  ?Medication Reconciliation was completed by comparing discharge summary, patient?s EMR and Pharmacy list, and upon discussion with patient. ?  ?Admitted to the hospital on 04/19/2021  due to COPD, HTN, , DM II, Rapid AFIB, AKI,. Discharge date was 04/22/2021. Discharged from [facility].   ?  ?New?Medications Started at [facility] Discharge:?? ?-START taking: ?diltiazem (DILACOR XR) 240 MG 24 hr capsule Take 1 capsule (240 mg total) by mouth daily. ?  ?Medication Changes at Hospital Discharge: ?-Changed None ID  ?  ?Medications Discontinued at Hospital Discharge: ?-STOP taking: ?lisinopril-hydrochlorothiazide 20-12.5 MG tablet (ZESTORETIC) ?meloxicam 7.5 MG tablet (MOBIC) ?  ?Medications that remain the same after Hospital Discharge:??  ?-All other medications will remain the same.   ? ? ?Objective: ? ?Lab Results  ?Component Value Date  ? CREATININE 0.85 11/11/2021  ? BUN 11 11/11/2021  ? GFRNONAA >60 04/21/2021  ? GFRAA 116 09/15/2020  ? NA 140 11/11/2021  ? K 3.7 11/11/2021  ? CALCIUM 9.0 11/11/2021  ? CO2 29 11/11/2021  ? GLUCOSE 196 (H) 11/11/2021  ? ? ?Lab Results  ?Component Value Date/Time  ? HGBA1C 8.8 (H) 11/11/2021 03:12 PM  ? HGBA1C 7.6 (A) 07/13/2021 01:25 PM  ? HGBA1C 7.3 (A) 03/16/2021 11:14 AM  ? HGBA1C 7.1 (H) 11/10/2019 02:27 PM  ? HGBA1C 6.6 07/11/2019 01:45 PM  ? HGBA1C 6.8 03/10/2019 10:08 AM  ? MICROALBUR 2.4 11/11/2021 03:12 PM  ? MICROALBUR 0.2 09/15/2020 03:00 PM  ?  MICROALBUR 50 07/17/2017 02:17 PM  ? MICROALBUR negative 03/11/2015 10:59 AM  ?  ?Last diabetic Eye exam:  ?Lab Results  ?Component Value Date/Time  ? HMDIABEYEEXA No Retinopathy 08/31/2021 12:00 AM  ?  ?Last diabetic Foot exam:  ?Lab Results  ?Component Value Date/Time  ? HMDIABFOOTEX Normal 02/11/2018 12:00 AM  ?  ? ?Lab Results  ?Component Value Date  ? CHOL 106 11/11/2021  ? HDL 50 11/11/2021  ? LDLCALC 43 11/11/2021  ? TRIG 46 11/11/2021  ? CHOLHDL 2.1 11/11/2021  ? ? ? ?  Latest Ref Rng & Units 11/11/2021  ?  3:12 PM 09/15/2020  ?  3:00 PM 11/10/2019  ?  2:27 PM  ?Hepatic Function  ?Total Protein 6.1 - 8.1 g/dL 6.1   6.5   6.3    ?AST 10 - 35 U/L '10   17   16    '$ ?ALT 9 - 46 U/L '10   14   13    '$ ?Total Bilirubin 0.2 - 1.2 mg/dL 0.8   1.2   0.9    ? ? ?Lab Results  ?Component Value Date/Time  ? TSH 1.706 04/19/2021 10:04 PM  ? ? ? ?  Latest Ref Rng & Units 08/18/2021  ?  2:17 PM 05/25/2021  ? 12:16 PM 04/22/2021  ?  4:56 AM  ?CBC  ?WBC 3.8 - 10.8 Thousand/uL 6.7   6.7   5.3    ?Hemoglobin 13.2 - 17.1 g/dL 14.9   15.2   15.9    ?Hematocrit 38.5 - 50.0 % 43.9  43.3   44.3    ?Platelets 140 - 400 Thousand/uL 184   167   132    ? ? ?Lab Results  ?Component Value Date/Time  ? VD25OH 26 (L) 03/30/2017 12:04 PM  ? VD25OH 26 (L) 12/15/2016 02:05 PM  ? ? ?Clinical ASCVD: No  ?The ASCVD Risk score (Arnett DK, et al., 2019) failed to calculate for the following reasons: ?  The valid total cholesterol range is 130 to 320 mg/dL   ? ? ?  11/11/2021  ?  2:03 PM 09/19/2021  ?  1:38 PM 08/18/2021  ?  1:37 PM  ?Depression screen PHQ 2/9  ?Decreased Interest 0 0 0  ?Down, Depressed, Hopeless 0 0 0  ?PHQ - 2 Score 0 0 0  ?Altered sleeping 0 0 0  ?Tired, decreased energy 0 0 0  ?Change in appetite 0 0 0  ?Feeling bad or failure about yourself  0 0 0  ?Trouble concentrating 0 0 0  ?Moving slowly or fidgety/restless 0 0 0  ?Suicidal thoughts 0 0 0  ?PHQ-9 Score 0 0 0  ?Difficult doing work/chores Not difficult at all Not difficult at all Not  difficult at all  ?  ?Social History  ? ?Tobacco Use  ?Smoking Status Every Day  ? Packs/day: 1.50  ? Years: 46.00  ? Pack years: 69.00  ? Types: Cigarettes  ?Smokeless Tobacco Never  ? ?BP Readings from [place]

## 2022-01-04 NOTE — Patient Instructions (Signed)
Visit Information ?It was great speaking with you today!  Please let me know if you have any questions about our visit. ? ? Goals Addressed   ? ?  ?  ?  ?  ? This Visit's Progress  ?  Monitor and Manage My Blood Sugar-Diabetes Type 2   Not on track  ?  Timeframe:  Long-Range Goal ?Priority:  High ?Start Date: 07/25/2021                            ?Expected End Date: 07/25/2022                     ? ?Follow Up within 90 days ?  ?-check blood sugar at least 5 times throughout the day ?- check blood sugar before and after exercise ?- check blood sugar if I feel it is too high or too low ?- take the blood sugar meter to all doctor visits  ?  ?Why is this important?   ?Checking your blood sugar at home helps to keep it from getting very high or very low.  ?Writing the results in a diary or log helps the doctor know how to care for you.  ?Your blood sugar log should have the time, date and the results.  ?Also, write down the amount of insulin or other medicine that you take.  ?Other information, like what you ate, exercise done and how you were feeling, will also be helpful.   ?  ?Notes:  ?  ?  Quit Smoking   Not on track  ?  If you wish to quit smoking, help is available. For free tobacco cessation program offerings call the [facility] at ([phone] or Live Well Line at 561-418-3836. You may also visit [email or web address] or email [email or web address] for more information on other programs.  ? ?  ? ?  ? ? ?Patient Care Plan: General Pharmacy (Adult)  ?  ? ?Problem Identified: Hypertension, Hyperlipidemia, Diabetes, Atrial Fibrillation, COPD, and Tobacco use   ?Priority: High  ?  ? ?Long-Range Goal: Patient-Specific Goal   ?Start Date: 07/25/2021  ?Expected End Date: 07/25/2022  ?This Visit's Progress: On track  ?Recent Progress: On track  ?Priority: High  ?Note:   ?Current Barriers:  ?Unable to independently afford treatment regimen ? ?Pharmacist Clinical Goal(s):  ?Patient will verbalize  ability to afford treatment regimen ?achieve control of diabetes as evidenced by A1c less than 7% through collaboration with PharmD and provider.  ? ?Interventions: ?1:1 collaboration with [name], MD regarding development and update of comprehensive plan of care as evidenced by provider attestation and co-signature ?Inter-disciplinary care team collaboration (see longitudinal plan of care) ?Comprehensive medication review performed; medication list updated in electronic medical record ? ?Hypertension (BP goal <140/90) ?-Controlled ?-Current treatment: ?Diltiazem XR 240 mg daily ?-Medications previously tried: NA  ?-Current home readings: NA ?-Denies hypotensive/hypertensive symptoms ?-Recommended to continue current medication ? ?Atrial Fibrillation (Goal: prevent stroke and major bleeding) ?-Controlled ?-CHADSVASC: 4 ?-Current treatment: ?Rate control: Diltiazem XR 240 mg daily ?Anticoagulation: Xarelto 20 mg daily ?-Medications previously tried: NA ?-Patient receiving Xarelto through [employer]. ?-May need to consider switching to Eliquis in 2023 due to easier financial assistance.  ?-Recommended to continue current medication ? ?Hyperlipidemia: (LDL goal < 70) ?-Controlled ?-Current treatment: ?Pravastatin 20 mg daily ?-Medications previously tried: NA  ?-Recommended to continue current medication ? ?Diabetes (A1c goal <7%) ?-Uncontrolled ?Diagnosed 20+ years ago ?-Current  medications: ?Levemir 8 units nightly  ?-Medications previously tried: Catering manager, Januvia, Metformin,   ?-Current home glucose readings ?fasting glucose: Not monitoring due to cost. Unable to tolerate Freestyle Libre due to skin irritation. ?-Reports hypoglycemic/hyperglycemic symptoms ?-Current meal patterns: Eating out 2-3 times weekly, struggles with sweets (cookies). ?-Current exercise patterns: 1x weekly (20 minutes) limited by PVD.[name]  ?-Recommended to continue current medication ? ?COPD (Goal: control symptoms and prevent  exacerbations) ?-Controlled ?-Current treatment  ?Trelegy 1 puff daily  ?-Medications previously tried: NA  ?-Exacerbations requiring treatment in last 6 months: Yes, hospitalized ?-Patient reports consistent use of maintenance inhaler.  ?-Frequency of rescue inhaler use: None ?-[employer] Patient Assistance form for Trelegy completed by patient and faxed for review on 08/03/2021 ?-Will consider switching to [facility] for financial assistance in 2023.  ?-Recommended to continue current medication ? ?Tobacco use (Goal Quit smoking) ?-Controlled ?-Previous quit attempts: None ?-Current treatment  ?1.5-2 ppd  ?-Patient is in a pre-contemplative stage of action and is not ready to quit.  ?-Provided contact information for [place] Quit Line (1-800-QUIT-NOW) and encouraged patient to reach out to this group for support. ? ?Chronic Pain (Goal: Minimize pain symptoms) ?-Controlled ?-Managed by Dr. Humphrey Rolls  ?-Current treatment  ?Gabapentin 300 1 cap AM, 1 cap Noon, 2 caps PM ?Tizanidine 2 mg twice daily  ?Tramadol 50 mg twice daily  ?-Medications previously tried: NA  ?-Recommended to continue current medication ? ?Patient Goals/Self-Care Activities ?Patient will:  ?- check glucose daily before breakfast, document, and provide at future appointments ? ?Follow Up Plan:Face to Face appointment with care management team member scheduled for: 02/22/2022 at 1:45 PM ?  ? ? ? ?Patient agreed to services and verbal consent obtained.  ? ?The patient verbalized understanding of instructions, educational materials, and care plan provided today and declined offer to receive copy of patient instructions, educational materials, and care plan.  ? ?Doristine Section, BCACP, CPP ?Clinical Pharmacist Practitioner  ?[facility] ?[phone]  ?

## 2022-01-16 ENCOUNTER — Other Ambulatory Visit: Payer: Self-pay | Admitting: Internal Medicine

## 2022-01-16 DIAGNOSIS — E1142 Type 2 diabetes mellitus with diabetic polyneuropathy: Secondary | ICD-10-CM

## 2022-01-17 NOTE — Telephone Encounter (Signed)
Requested Prescriptions  Pending Prescriptions Disp Refills  . LEVEMIR 100 UNIT/ML injection [Pharmacy Med Name: Levemir 100 UNIT/ML Subcutaneous Solution] 10 mL 0    Sig: INJECT 8 UNITS SUBCUTANEOUSLY AT BEDTIME     Endocrinology:  Diabetes - Insulins Failed - 01/16/2022  9:07 AM      Failed - HBA1C is between 0 and 7.9 and within 180 days    HbA1c, POC (controlled diabetic range)  Date Value Ref Range Status  07/11/2019 6.6 0.0 - 7.0 % Final   Hgb A1c MFr Bld  Date Value Ref Range Status  11/11/2021 8.8 (H) <5.7 % of total Hgb Final    Comment:    For someone without known diabetes, a hemoglobin A1c value of 6.5% or greater indicates that they may have  diabetes and this should be confirmed with a follow-up  test. . For someone with known diabetes, a value <7% indicates  that their diabetes is well controlled and a value  greater than or equal to 7% indicates suboptimal  control. A1c targets should be individualized based on  duration of diabetes, age, comorbid conditions, and  other considerations. . Currently, no consensus exists regarding use of hemoglobin A1c for diagnosis of diabetes for children. Renella Cunas - Valid encounter within last 6 months    Recent Outpatient Visits          2 months ago Type 2 diabetes mellitus with diabetic polyneuropathy, with long-term current use of insulin [facility])   [facility] [place], Drue Stager, MD   4 months ago Type 2 diabetes mellitus with diabetic polyneuropathy, with long-term current use of insulin [facility])   [facility] [place], Grayland Ormond, DO   5 months ago Left leg swelling   [place], DO   5 months ago Left leg cellulitis   [facility] Teodora Medici, DO   6 months ago Type 2 diabetes mellitus with diabetic polyneuropathy, with long-term current use of insulin [facility])   [facility] [name], MD       Future Appointments            In 4 weeks Vickie Epley, MD [facility], [place]   In 1 month [name], MD [facility], [place] [facility]

## 2022-01-25 DIAGNOSIS — E1159 Type 2 diabetes mellitus with other circulatory complications: Secondary | ICD-10-CM | POA: Diagnosis not present

## 2022-01-25 DIAGNOSIS — F17210 Nicotine dependence, cigarettes, uncomplicated: Secondary | ICD-10-CM | POA: Diagnosis not present

## 2022-01-25 DIAGNOSIS — Z794 Long term (current) use of insulin: Secondary | ICD-10-CM | POA: Diagnosis not present

## 2022-01-25 DIAGNOSIS — E785 Hyperlipidemia, unspecified: Secondary | ICD-10-CM

## 2022-01-25 DIAGNOSIS — I10 Essential (primary) hypertension: Secondary | ICD-10-CM | POA: Diagnosis not present

## 2022-01-25 DIAGNOSIS — I4891 Unspecified atrial fibrillation: Secondary | ICD-10-CM | POA: Diagnosis not present

## 2022-01-25 DIAGNOSIS — J449 Chronic obstructive pulmonary disease, unspecified: Secondary | ICD-10-CM

## 2022-01-26 ENCOUNTER — Ambulatory Visit: Payer: PPO

## 2022-02-01 DIAGNOSIS — Z0289 Encounter for other administrative examinations: Secondary | ICD-10-CM | POA: Diagnosis not present

## 2022-02-08 ENCOUNTER — Other Ambulatory Visit (INDEPENDENT_AMBULATORY_CARE_PROVIDER_SITE_OTHER): Payer: Self-pay | Admitting: Nurse Practitioner

## 2022-02-08 DIAGNOSIS — I739 Peripheral vascular disease, unspecified: Secondary | ICD-10-CM

## 2022-02-13 ENCOUNTER — Telehealth: Payer: Self-pay | Admitting: Cardiology

## 2022-02-13 DIAGNOSIS — F1120 Opioid dependence, uncomplicated: Secondary | ICD-10-CM | POA: Diagnosis not present

## 2022-02-13 DIAGNOSIS — M545 Low back pain, unspecified: Secondary | ICD-10-CM | POA: Diagnosis not present

## 2022-02-13 DIAGNOSIS — Z79899 Other long term (current) drug therapy: Secondary | ICD-10-CM | POA: Diagnosis not present

## 2022-02-13 DIAGNOSIS — I10 Essential (primary) hypertension: Secondary | ICD-10-CM | POA: Diagnosis not present

## 2022-02-13 DIAGNOSIS — E119 Type 2 diabetes mellitus without complications: Secondary | ICD-10-CM | POA: Diagnosis not present

## 2022-02-13 DIAGNOSIS — G894 Chronic pain syndrome: Secondary | ICD-10-CM | POA: Diagnosis not present

## 2022-02-13 DIAGNOSIS — M5416 Radiculopathy, lumbar region: Secondary | ICD-10-CM | POA: Diagnosis not present

## 2022-02-13 DIAGNOSIS — Z716 Tobacco abuse counseling: Secondary | ICD-10-CM | POA: Diagnosis not present

## 2022-02-13 DIAGNOSIS — F209 Schizophrenia, unspecified: Secondary | ICD-10-CM | POA: Diagnosis not present

## 2022-02-13 DIAGNOSIS — M961 Postlaminectomy syndrome, not elsewhere classified: Secondary | ICD-10-CM | POA: Diagnosis not present

## 2022-02-13 DIAGNOSIS — M5430 Sciatica, unspecified side: Secondary | ICD-10-CM | POA: Diagnosis not present

## 2022-02-13 DIAGNOSIS — Z720 Tobacco use: Secondary | ICD-10-CM | POA: Diagnosis not present

## 2022-02-13 DIAGNOSIS — G8929 Other chronic pain: Secondary | ICD-10-CM | POA: Diagnosis not present

## 2022-02-13 NOTE — Telephone Encounter (Signed)
I spoke with the patient. He advised he saw Dr. Humphrey Rolls at the pain clinic today. He told to call our office as his documented HR in the office today was 44 bpm & BP- 166/83.  Dr. Humphrey Rolls was concerned about his low HR's. I have confirmed with the patient that: - he is not having any symptoms of dizziness/ lightheadedness/ pre-syncope/ SOB - his vital signs were checked only with an automatic machine today (no manuel pulse was taken)  I have advised the patient that it hard to tell if he is truly bradycardiac or having some premature beats that will not register as a heart beat on an automatic machine. The patient confirms he does not have a way to check his BP/ HR at home.  He is scheduled to see Dr. Quentin Ore this week on Wednesday in [place]. I have advised the patient to keep his scheduled appointment, no changes prior to that due to no symptoms. He is aware to call the office in the interim if symptoms develop.  I have also confirmed with him that he is still taking diltiazem 240 mg once daily. He is aware this may need to be down titrated depending on his HR reading on Wednesday.   The patient voices understanding of the above and is agreeable. He was appreciative for the call back.

## 2022-02-13 NOTE — Telephone Encounter (Signed)
Dr. Humphrey Rolls advised pt to make our office aware of his vitals at his appointment this morning at 10:00 AM his BP was 166/83 HR 44, tempeture read 97.8 and his Wt is 165 lbs. Transferring to triage due to low HR. Patient does not have a way to check his HR and denies any symptoms. Spoke with nurse who said she will return patient's call.

## 2022-02-15 ENCOUNTER — Ambulatory Visit: Payer: PPO

## 2022-02-15 ENCOUNTER — Encounter: Payer: Self-pay | Admitting: Cardiology

## 2022-02-15 ENCOUNTER — Telehealth: Payer: Self-pay

## 2022-02-15 ENCOUNTER — Ambulatory Visit: Payer: PPO | Admitting: Cardiology

## 2022-02-15 VITALS — BP 136/84 | HR 89 | Ht 71.0 in | Wt 164.0 lb

## 2022-02-15 DIAGNOSIS — I4892 Unspecified atrial flutter: Secondary | ICD-10-CM

## 2022-02-15 DIAGNOSIS — I4819 Other persistent atrial fibrillation: Secondary | ICD-10-CM | POA: Diagnosis not present

## 2022-02-15 NOTE — Progress Notes (Signed)
Electrophysiology Office Follow up Visit Note:    Date:  02/15/2022   ID:  Mark Nash, DOB 04/24/1956, MRN [id number]  PCP:  [name], MD  [facility] Cardiologist:  Ida Rogue, MD  [facility] Electrophysiologist:  Vickie Epley, MD    Interval History:    Mark Nash is a 66 y.o. male who presents for a follow up visit. They were last seen in clinic August 10, 2021 for persistent atrial fibrillation.  He was originally scheduled for an ablation but this was canceled because of lower extremity wounds.  The wounds were treated with antibiotics and wound care.  He is on Xarelto for stroke prophylaxis.  He continues to receive care with podiatry.       Past Medical History:  Diagnosis Date   Arthritis    Asthma    Benign neoplasm of ascending colon    polyps   Constipation    COPD (chronic obstructive pulmonary disease) (HCC)    NO INHALER   Diabetes mellitus without complication (HCC)    Type 2   Diverticulosis    Dyspnea    RARE-WITH EXERTION DUE TO COPD   GERD (gastroesophageal reflux disease)    Hepatitis C    History of kidney stones    H/O   Hyperlipidemia    Hypertension 06/10/2018   currently not under control   Left inguinal hernia    Peripheral vascular disease (HCC)    Restless legs    Schizoaffective disorder (HCC)    Pt denies   Thrombocytopenia ([place])    Vitamin D deficiency    Wears dentures    full upper and lower    Past Surgical History:  Procedure Laterality Date   BACK SURGERY     CARDIOVERSION N/A 05/20/2021   Procedure: CARDIOVERSION;  Surgeon: Wellington Hampshire, MD;  Location: [facility];  Service: Cardiovascular;  Laterality: N/A;   CATARACT EXTRACTION W/PHACO Right 10/31/2021   Procedure: CATARACT EXTRACTION PHACO AND INTRAOCULAR LENS PLACEMENT ([place]) RIGHT DIABETIC;  Surgeon: Eulogio Bear, MD;  Location: [place];  Service: Ophthalmology;  Laterality: Right;  Diabetic 4.34 00:42.8    COLONOSCOPY WITH PROPOFOL N/A 09/07/2017   Procedure: COLONOSCOPY WITH PROPOFOL;  Surgeon: Virgel Manifold, MD;  Location: [facility];  Service: Endoscopy;  Laterality: N/A;   ESOPHAGOGASTRODUODENOSCOPY (EGD) WITH PROPOFOL N/A 12/07/2017   Procedure: ESOPHAGOGASTRODUODENOSCOPY (EGD) WITH PROPOFOL;  Surgeon: Virgel Manifold, MD;  Location: [facility];  Service: Endoscopy;  Laterality: N/A;   FLEXIBLE SIGMOIDOSCOPY N/A 11/09/2017   Procedure: FLEXIBLE SIGMOIDOSCOPY;  Surgeon: Virgel Manifold, MD;  Location: [facility];  Service: Endoscopy;  Laterality: N/A;   HERNIA REPAIR     inguinal   INGUINAL HERNIA REPAIR Bilateral 06/21/2018   Procedure: LAPAROSCOPIC BILATERAL INGUINAL HERNIA REPAIR;  Surgeon: Vickie Epley, MD;  Location: [facility];  Service: General;  Laterality: Bilateral;   L3 TO L5 LAMINECTOMY FOR DECOMPRESSION  07/17/2016   [facility]   REPAIR OF CEREBROSPINAL FLUID LEAK N/A 09/08/2016   Procedure: Lumbar wound exploration, repair of pseudomenigocele;  Surgeon: Kevan Ny Ditty, MD;  Location: [place];  Service: Neurosurgery;  Laterality: N/A;  Lumbar wound exploration, repair of pseudomenigocele   SINUSOTOMY      Current Medications: Current Meds  Medication Sig   blood glucose meter kit and supplies KIT Dispense based on patient and insurance preference. Use up to tid E11.65, LON:99   diltiazem (DILACOR XR) 240 MG 24 hr capsule  Take 1 capsule (240 mg total) by mouth daily.   ergocalciferol (VITAMIN D2) 1.25 MG (50000 UT) capsule Take 1 capsule (50,000 Units total) by mouth every 14 (fourteen) days.   Fluticasone-Umeclidin-Vilant (TRELEGY ELLIPTA) 100-62.5-25 MCG/ACT AEPB Inhale 1 puff into the lungs daily.   furosemide (LASIX) 20 MG tablet Take 1 tablet (20 mg total) by mouth daily.   gabapentin (NEURONTIN) 300 MG capsule Take 300-600 mg by mouth See admin instructions. Take 1 capsule (300 mg) by mouth in the morning, take 1  capsule (300 mg) by mouth at noon, and 2 capsules (600 mg) by mouth in the evening   glucose blood (FREESTYLE PRECISION NEO TEST) test strip Check fsbs bid prn  E11.69 On insulin   Lancet Devices MISC 1 each by Does not apply route 3 (three) times daily as needed. What ins will cover dx:E11.65, LON:99 tid   LEVEMIR 100 UNIT/ML injection INJECT 8 UNITS SUBCUTANEOUSLY AT BEDTIME   pravastatin (PRAVACHOL) 20 MG tablet Take 1 tablet (20 mg total) by mouth daily with supper.   rivaroxaban (XARELTO) 20 MG TABS tablet Take 1 tablet (20 mg total) by mouth daily with supper.   tiZANidine (ZANAFLEX) 2 MG tablet Take 2 mg by mouth every 12 (twelve) hours.   traMADol (ULTRAM) 50 MG tablet Take 50 mg by mouth every 12 (twelve) hours.     Allergies:   Penicillins, Aspirin, Ibuprofen, Lyrica [pregabalin], and Acetaminophen   Social History   Socioeconomic History   Marital status: Divorced    Spouse name: Not on file   Number of children: 2   Years of education: Not on file   Highest education level: 12th grade  Occupational History   Occupation: Disabled  Tobacco Use   Smoking status: Every Day    Packs/day: 1.50    Years: 46.00    Total pack years: 69.00    Types: Cigarettes   Smokeless tobacco: Never  Vaping Use   Vaping Use: Never used  Substance and Sexual Activity   Alcohol use: No    Alcohol/week: 0.0 standard drinks of alcohol   Drug use: No   Sexual activity: Not Currently  Other Topics Concern   Not on file  Social History Narrative   Lives alone   Social Determinants of Health   Financial Resource Strain: High Risk (10/25/2021)   Overall Financial Resource Strain (CARDIA)    Difficulty of Paying Living Expenses: Hard  Food Insecurity: No Food Insecurity (12/02/2020)   Hunger Vital Sign    Worried About Running Out of Food in the Last Year: Never true    Ran Out of Food in the Last Year: Never true  Transportation Needs: No Transportation Needs (12/02/2020)   PRAPARE -  Hydrologist (Medical): No    Lack of Transportation (Non-Medical): No  Physical Activity: Insufficiently Active (12/02/2020)   Exercise Vital Sign    Days of Exercise per Week: 2 days    Minutes of Exercise per Session: 20 min  Stress: No Stress Concern Present (12/02/2020)   [place]    Feeling of Stress : Not at all  Social Connections: Socially Isolated (12/02/2020)   Social Connection and Isolation Panel [NHANES]    Frequency of Communication with Friends and Family: More than three times a week    Frequency of Social Gatherings with Friends and Family: Three times a week    Attends Religious Services: Never    Active  Member of Clubs or Organizations: No    Attends Music therapist: Never    Marital Status: Divorced     Family History: The patient's family history includes Colitis in his mother; Diabetes in his father and sister; Diverticulitis in his brother and mother; Heart disease in his father; Heart failure in his brother and mother; Hypertension in his father and mother.  ROS:   Please see the history of present illness.    All other systems reviewed and are negative.  EKGs/Labs/Other Studies Reviewed:    The following studies were reviewed today:  EKG today shows atrial flutter, typical, ventricular rate 89 bpm and PVC.   Recent Labs: 04/19/2021: Magnesium 2.0; TSH 1.706 08/18/2021: Brain Natriuretic Peptide 40; Hemoglobin 14.9; Platelets 184 11/11/2021: ALT 10; BUN 11; Creat 0.85; Potassium 3.7; Sodium 140  Recent Lipid Panel    Component Value Date/Time   CHOL 106 11/11/2021 1512   CHOL 112 07/13/2015 1018   TRIG 46 11/11/2021 1512   HDL 50 11/11/2021 1512   HDL 62 07/13/2015 1018   CHOLHDL 2.1 11/11/2021 1512   VLDL 12 12/15/2016 1405   LDLCALC 43 11/11/2021 1512    Physical Exam:    VS:  BP 136/84   Pulse 89   Ht 5' 11" (1.803 m)   Wt 164 lb  (74.4 kg)   SpO2 98%   BMI 22.87 kg/m     Wt Readings from Last 3 Encounters:  02/15/22 164 lb (74.4 kg)  11/11/21 169 lb 14.4 oz (77.1 kg)  10/31/21 170 lb (77.1 kg)     GEN:  Well nourished, well developed in no acute distress HEENT: Normal NECK: No JVD; No carotid bruits LYMPHATICS: No lymphadenopathy CARDIAC: Irregularly irregular , no murmurs, rubs, gallops RESPIRATORY:  Clear to auscultation without rales, wheezing or rhonchi  ABDOMEN: Soft, non-tender, non-distended MUSCULOSKELETAL:  No edema; No deformity  SKIN: Bilateral lower extremities erythematous to the knees and warm to touch.  1+ pitting edema bilaterally to the knees.   NEUROLOGIC:  Alert and oriented x 3 PSYCHIATRIC:  Normal affect        ASSESSMENT:    1. Persistent atrial fibrillation ([place])   2. Atrial flutter, unspecified type ([place])    PLAN:    In order of problems listed above:   #Persistent atrial fibrillation and flutter Not a candidate for catheter ablation.  No symptoms right now with controlled ventricular rate.  Continue diltiazem for rate control and Xarelto for stroke prophylaxis.  #Lower extremity wounds, peripheral vascular disease Continue follow-up with podiatry and primary care.  Follow-up in 1 year.  APP appointment okay.    Medication Adjustments/Labs and Tests Ordered: Current medicines are reviewed at length with the patient today.  Concerns regarding medicines are outlined above.  No orders of the defined types were placed in this encounter.  No orders of the defined types were placed in this encounter.    Signed, [name], MD, [facility], [facility] 02/15/2022 11:39 AM    Electrophysiology  [facility]

## 2022-02-15 NOTE — Progress Notes (Cosign Needed)
    Chronic Care Management Pharmacy Assistant   Name: CURLEE BOGAN  MRN: [id number] DOB: [birth year]  Reason for Encounter: Patient Assistance Application for Eliquis 5 mg twice daily and Breztri 2 puffs daily  I received a task from [name], CPP requesting that I start the patient assistance process for the patient to start receiving Eliquis and Breztri.  Eliquis application started with [employer]. Application started and e-mailed to CPP for printing to have the patient complete at his face-to-face visit 02/22/2022 @ 1345.  Breztri application can be done online with [employer]. I spoke with the patient and he advised he is in the doughnut hole with Trelegy, and is okay with trying Breztri. I advised him at this appointment next week with CPP he would be given a sample of the [place] and I would also complete the patient assistance application online with [employer] for this medication so he can get it for free if approved. Patient verbalized understanding and is okay with the application process.   Application for patient assistance for patient's [name] was approved via online through [employer]. Patient's enrollment date starts 06/21/203 and ends 08/27/2022. Task has been submitted to CPP to send prescription to [facility] pharmacy.    Medications: Outpatient Encounter Medications as of 02/15/2022  Medication Sig   blood glucose meter kit and supplies KIT Dispense based on patient and insurance preference. Use up to tid E11.65, LON:99   diltiazem (DILACOR XR) 240 MG 24 hr capsule Take 1 capsule (240 mg total) by mouth daily.   ergocalciferol (VITAMIN D2) 1.25 MG (50000 UT) capsule Take 1 capsule (50,000 Units total) by mouth every 14 (fourteen) days.   Fluticasone-Umeclidin-Vilant (TRELEGY ELLIPTA) 100-62.5-25 MCG/ACT AEPB Inhale 1 puff into the lungs daily.   furosemide (LASIX) 20 MG tablet Take 1 tablet (20 mg total) by mouth daily. (Patient not taking: Reported on 11/11/2021)    gabapentin (NEURONTIN) 300 MG capsule Take 300-600 mg by mouth See admin instructions. Take 1 capsule (300 mg) by mouth in the morning, take 1 capsule (300 mg) by mouth at noon, and 2 capsules (600 mg) by mouth in the evening   glucose blood (FREESTYLE PRECISION NEO TEST) test strip Check fsbs bid prn  E11.69 On insulin   Lancet Devices MISC 1 each by Does not apply route 3 (three) times daily as needed. What ins will cover dx:E11.65, LON:99 tid   LEVEMIR 100 UNIT/ML injection INJECT 8 UNITS SUBCUTANEOUSLY AT BEDTIME   pravastatin (PRAVACHOL) 20 MG tablet Take 1 tablet (20 mg total) by mouth daily with supper.   rivaroxaban (XARELTO) 20 MG TABS tablet Take 1 tablet (20 mg total) by mouth daily with supper.   tiZANidine (ZANAFLEX) 2 MG tablet Take 2 mg by mouth every 12 (twelve) hours.   traMADol (ULTRAM) 50 MG tablet Take 50 mg by mouth every 12 (twelve) hours.   No facility-administered encounter medications on file as of 02/15/2022.    Lynann Bologna, CPA/CMA Clinical Pharmacist Assistant Phone: [phone]

## 2022-02-15 NOTE — Patient Instructions (Signed)
Medication Instructions:  No Changes   Follow-Up:  6 months  We recommend signing up for the patient portal called "MyChart".  Sign up information is provided on this After Visit Summary.  MyChart is used to connect with patients for Virtual Visits (Telemedicine).  Patients are able to view lab/test results, encounter notes, upcoming appointments, etc.  Non-urgent messages can be sent to your provider as well.   To learn more about what you can do with MyChart, go to [email or web address].    Your next appointment:   6 month(s)  The format for your next appointment:   In Person  Provider:   You will see one of the following Advanced Practice Providers on your designated Care Team:   Murray Hodgkins, NP Christell Faith, PA-C Cadence Kathlen Mody, [place]       Important Information About Sugar

## 2022-02-17 ENCOUNTER — Encounter (INDEPENDENT_AMBULATORY_CARE_PROVIDER_SITE_OTHER): Payer: PPO

## 2022-02-17 ENCOUNTER — Encounter (INDEPENDENT_AMBULATORY_CARE_PROVIDER_SITE_OTHER): Payer: PPO | Admitting: Nurse Practitioner

## 2022-02-21 ENCOUNTER — Telehealth: Payer: Self-pay

## 2022-02-21 NOTE — Progress Notes (Signed)
    Chronic Care Management Pharmacy Assistant   Name: Mark Nash  MRN: [id number] DOB: 02-22-1956    Patient called to be reminded of his face to face appointment with Mark Nash, CPP on 02/22/2022 @ 1345.   Patient aware of appointment date, time, and type of appointment (either telephone or in person). Patient aware to have/bring all medications, supplements, blood pressure and/or blood sugar logs to visit. Patient also informed to bring a copy of his proof of income and a copy of his financial statement from his pharmacy.   Star Rating Drug: Pravastatin 20 mg last filled on 01/24/2022 for a 90-Day supply with [facility]   Any gaps in medications fill history? NO   Care Gaps: COVID-19 Vaccine Booster 4 Zoster Vaccines  Adelene Idler, CPA/CMA Clinical Pharmacist Assistant Phone: [phone]

## 2022-02-22 ENCOUNTER — Ambulatory Visit (INDEPENDENT_AMBULATORY_CARE_PROVIDER_SITE_OTHER): Payer: PPO

## 2022-02-22 DIAGNOSIS — I4819 Other persistent atrial fibrillation: Secondary | ICD-10-CM

## 2022-02-22 DIAGNOSIS — Z794 Long term (current) use of insulin: Secondary | ICD-10-CM

## 2022-02-22 DIAGNOSIS — E1151 Type 2 diabetes mellitus with diabetic peripheral angiopathy without gangrene: Secondary | ICD-10-CM

## 2022-02-22 DIAGNOSIS — J449 Chronic obstructive pulmonary disease, unspecified: Secondary | ICD-10-CM

## 2022-02-22 MED ORDER — BREZTRI AEROSPHERE 160-9-4.8 MCG/ACT IN AERO: 2.0000 | INHALATION_SPRAY | Freq: Two times a day (BID) | RESPIRATORY_TRACT | 3 refills | Status: DC

## 2022-02-22 NOTE — Progress Notes (Signed)
Chronic Care Management Pharmacy Note  02/22/2022 Name:  Mark Nash MRN:  [id number] DOB:  1956-04-04  Summary: Patient presents for CCM follow-up.  -Patient receiving Xarelto through [facility], but still has concerns regarding cost of medication.  -Patient given Breztri sample today and instructed thoroughly on use. Patient was already approved for PAP for Breztri.  Recommendations/Changes made from today's visit: Will discuss with Dr. Quentin Ore the potential of switching patient to Eliquis and applying for patient assistance.   -Finish supply of Trelegy then STOP Trelegy  -START Breztri 2 puffs twice daily via [place] Patient assistance paperwork   Plan: CPP follow-up in 3 months  Subjective: Mark Nash is an 66 y.o. year old male who is a primary patient of [name], MD.  The CCM team was consulted for assistance with disease management and care coordination needs.    Engaged with patient by telephone for follow up visit in response to provider referral for pharmacy case management and/or care coordination services.   Consent to Services:  The patient was given information about Chronic Care Management services, agreed to services, and gave verbal consent prior to initiation of services.  Please see initial visit note for detailed documentation.   Patient Care Team: [name], MD as PCP - General (Family Medicine) Vickie Epley, MD as PCP - Electrophysiology (Cardiology) Minna Merritts, MD as PCP - Cardiology (Cardiology) Virgel Manifold, MD (Inactive) as Consulting Physician (Gastroenterology) Leim Fabry, MD as Consulting Physician (Orthopedic Surgery) Caroline More, DPM as Consulting Physician (Podiatry) Nathanial Rancher, MD as Referring Physician (Pain Medicine) End, Harrell Gave, MD as Consulting Physician (Cardiology) Germaine Pomfret, [facility] (Pharmacist)  Recent office visits: 11/11/21: Patient presented to Dr. Ancil Boozer for follow-up.  A1c 8.8%.  09/19/21: Patient presented to Dr. Rosana Berger for follow-up.   Recent consult visits: 02/15/22: Patient presented to Dr. Quentin Ore (Cardiology) for follow-up.   Hospital visits: None in past 6 months.   Objective:  Lab Results  Component Value Date   CREATININE 0.85 11/11/2021   BUN 11 11/11/2021   GFRNONAA >60 04/21/2021   GFRAA 116 09/15/2020   NA 140 11/11/2021   K 3.7 11/11/2021   CALCIUM 9.0 11/11/2021   CO2 29 11/11/2021   GLUCOSE 196 (H) 11/11/2021    Lab Results  Component Value Date/Time   HGBA1C 8.8 (H) 11/11/2021 03:12 PM   HGBA1C 7.6 (A) 07/13/2021 01:25 PM   HGBA1C 7.3 (A) 03/16/2021 11:14 AM   HGBA1C 7.1 (H) 11/10/2019 02:27 PM   HGBA1C 6.6 07/11/2019 01:45 PM   HGBA1C 6.8 03/10/2019 10:08 AM   MICROALBUR 2.4 11/11/2021 03:12 PM   MICROALBUR 0.2 09/15/2020 03:00 PM   MICROALBUR 50 07/17/2017 02:17 PM   MICROALBUR negative 03/11/2015 10:59 AM    Last diabetic Eye exam:  Lab Results  Component Value Date/Time   HMDIABEYEEXA No Retinopathy 08/31/2021 12:00 AM    Last diabetic Foot exam:  Lab Results  Component Value Date/Time   HMDIABFOOTEX Normal 02/11/2018 12:00 AM     Lab Results  Component Value Date   CHOL 106 11/11/2021   HDL 50 11/11/2021   LDLCALC 43 11/11/2021   TRIG 46 11/11/2021   CHOLHDL 2.1 11/11/2021       Latest Ref Rng & Units 11/11/2021    3:12 PM 09/15/2020    3:00 PM 11/10/2019    2:27 PM  Hepatic Function  Total Protein 6.1 - 8.1 g/dL 6.1  6.5  6.3   AST 10 - 35 U/L  _0 ALT 9 - 46 U/L _1 Total Bilirubin 0.2 - 1.2 mg/dL 0.8  1.2  0.9     Lab Results  Component Value Date/Time   TSH 1.706 04/19/2021 10:04 PM       Latest Ref Rng & Units 08/18/2021    2:17 PM 05/25/2021   12:16 PM 04/22/2021    4:56 AM  CBC  WBC 3.8 - 10.8 Thousand/uL 6.7  6.7  5.3   Hemoglobin 13.2 - 17.1 g/dL 14.9  15.2  15.9   Hematocrit 38.5 - 50.0 % 43.9  43.3  44.3   Platelets 140 - 400 Thousand/uL 184  167  132      Lab Results  Component Value Date/Time   VD25OH 26 (L) 03/30/2017 12:04 PM   VD25OH 26 (L) 12/15/2016 02:05 PM    Clinical ASCVD: No  The ASCVD Risk score (Arnett DK, et al., 2019) failed to calculate for the following reasons:   The valid total cholesterol range is 130 to 320 mg/dL       11/11/2021    2:03 PM 09/19/2021    1:38 PM 08/18/2021    1:37 PM  Depression screen PHQ 2/9  Decreased Interest 0 0 0  Down, Depressed, Hopeless 0 0 0  PHQ - 2 Score 0 0 0  Altered sleeping 0 0 0  Tired, decreased energy 0 0 0  Change in appetite 0 0 0  Feeling bad or failure about yourself  0 0 0  Trouble concentrating 0 0 0  Moving slowly or fidgety/restless 0 0 0  Suicidal thoughts 0 0 0  PHQ-9 Score 0 0 0  Difficult doing work/chores Not difficult at all Not difficult at all Not difficult at all    Social History   Tobacco Use  Smoking Status Every Day   Packs/day: 1.50   Years: 46.00   Total pack years: 69.00   Types: Cigarettes  Smokeless Tobacco Never   BP Readings from Last 3 Encounters:  02/15/22 136/84  11/11/21 138/82  10/31/21 (!) 134/94   Pulse Readings from Last 3 Encounters:  02/15/22 89  11/11/21 60  10/31/21 64   Wt Readings from Last 3 Encounters:  02/15/22 164 lb (74.4 kg)  11/11/21 169 lb 14.4 oz (77.1 kg)  10/31/21 170 lb (77.1 kg)   BMI Readings from Last 3 Encounters:  02/15/22 22.87 kg/m  11/11/21 23.70 kg/m  10/31/21 23.71 kg/m    Assessment/Interventions: Review of patient past medical history, allergies, medications, health status, including review of consultants reports, laboratory and other test data, was performed as part of comprehensive evaluation and provision of chronic care management services.   SDOH:  (Social Determinants of Health) assessments and interventions performed: Yes    SDOH Screenings   Alcohol Screen: Low Risk  (08/11/2021)   Alcohol Screen    Last Alcohol Screening Score (AUDIT): 0  Depression (PHQ2-9):  Low Risk  (11/11/2021)   Depression (PHQ2-9)    PHQ-2 Score: 0  Financial Resource Strain: High Risk (10/25/2021)   Overall Financial Resource Strain (CARDIA)    Difficulty of Paying Living Expenses: Hard  Food Insecurity: No Food Insecurity (12/02/2020)   Hunger Vital Sign    Worried About Running Out of Food in the Last Year: Never true    Ran Out of Food in the Last Year: Never true  Housing: Low Risk  (12/02/2020)   [place]  Risk Score: 0  Physical Activity: Insufficiently Active (12/02/2020)   Exercise Vital Sign    Days of Exercise per Week: 2 days    Minutes of Exercise per Session: 20 min  Social Connections: Socially Isolated (12/02/2020)   Social Connection and Isolation Panel [NHANES]    Frequency of Communication with Friends and Family: More than three times a week    Frequency of Social Gatherings with Friends and Family: Three times a week    Attends Religious Services: Never    Active Member of Clubs or Organizations: No    Attends Archivist Meetings: Never    Marital Status: Divorced  Stress: No Stress Concern Present (12/02/2020)   [place]    Feeling of Stress : Not at all  Tobacco Use: High Risk (02/15/2022)   Patient History    Smoking Tobacco Use: Every Day    Smokeless Tobacco Use: Never    Passive Exposure: Not on file  Transportation Needs: No Transportation Needs (12/02/2020)   PRAPARE - Transportation    Lack of Transportation (Medical): No    Lack of Transportation (Non-Medical): No    CCM Care Plan  Allergies  Allergen Reactions   Penicillins Hives and Swelling    As a child. Has patient had a PCN reaction causing immediate rash, facial/tongue/throat swelling, SOB or lightheadedness with hypotension: YES  Has patient had a PCN reaction causing severe rash involving mucus membranes or skin necrosis: UNKNOWN Has patient had a PCN reaction that required  hospitalization: UNKNOWN Has patient had a PCN reaction occurring within the last 10 years: NO   Aspirin Nausea Only   Ibuprofen Nausea Only   Lyrica [Pregabalin] Nausea Only   Acetaminophen Nausea Only    Medications Reviewed Today     Reviewed by Guido Sander, CMA (Certified Medical Assistant) on 02/15/22 at 1131  Med List Status: <None>   Medication Order Taking? Sig Documenting Provider Last Dose Status Informant  blood glucose meter kit and supplies KIT [id number] Yes Dispense based on patient and insurance preference. Use up to tid E11.65, LON:99 Teodora Medici, DO Taking Active   diltiazem (DILACOR XR) 240 MG 24 hr capsule [id number] Yes Take 1 capsule (240 mg total) by mouth daily. End, Harrell Gave, MD Taking Active Self  ergocalciferol (VITAMIN D2) 1.25 MG (50000 UT) capsule [id number] Yes Take 1 capsule (50,000 Units total) by mouth every 14 (fourteen) days. [name], MD Taking Active   Fluticasone-Umeclidin-[facility] ELLIPTA) 100-62.5-25 MCG/ACT AEPB [id number] Yes Inhale 1 puff into the lungs daily. [name], MD Taking Active   furosemide (LASIX) 20 MG tablet [id number] Yes Take 1 tablet (20 mg total) by mouth daily. Teodora Medici, DO Taking Active   gabapentin (NEURONTIN) 300 MG capsule [id number] Yes Take 300-600 mg by mouth See admin instructions. Take 1 capsule (300 mg) by mouth in the morning, take 1 capsule (300 mg) by mouth at noon, and 2 capsules (600 mg) by mouth in the evening [provider] Taking Active Self  glucose blood (FREESTYLE PRECISION NEO TEST) test strip [id number] Yes Check fsbs bid prn  E11.69 On insulin [name], MD Taking Active   Lancet Devices MISC [id number] Yes 1 each by Does not apply route 3 (three) times daily as needed. What ins will cover dx:E11.65, LON:99 tid Teodora Medici, DO Taking Active   LEVEMIR 100 UNIT/ML injection [id number] Yes INJECT 8 UNITS SUBCUTANEOUSLY AT BEDTIME [name], MD Taking  Active   pravastatin (  PRAVACHOL) 20 MG tablet [id number] Yes Take 1 tablet (20 mg total) by mouth daily with supper. [name], MD Taking Active   rivaroxaban (XARELTO) 20 MG TABS tablet [id number] Yes Take 1 tablet (20 mg total) by mouth daily with supper. Vickie Epley, MD Taking Active   tiZANidine (ZANAFLEX) 2 MG tablet [id number] Yes Take 2 mg by mouth every 12 (twelve) hours. Allyne Gee, MD Taking Active Self  traMADol Veatrice [place]bon) 50 MG tablet [id number] Yes Take 50 mg by mouth every 12 (twelve) hours. Allyne Gee, MD Taking Active Self  Med List Note Al Decant, RN 10/03/18 1438): UDS 10/03/18            Patient Active Problem List   Diagnosis Date Noted   Persistent atrial fibrillation ([place]) 05/07/2021   Diabetes mellitus type 2 with peripheral artery disease ([place]) 03/16/2021   DDD (degenerative disc disease), lumbar 03/06/2019   Primary osteoarthritis involving multiple joints 03/06/2019   Lumbar radiculopathy 11/05/2018   Lumbar post-laminectomy syndrome 11/05/2018   Diabetic polyneuropathy associated with type 2 diabetes mellitus ([place]) 11/05/2018   CLE (columnar lined esophagus)    Gastric irritation    Duodenum ulcer    Personal history of colonic polyps    Polyp of sigmoid colon    Diverticulosis of large intestine without diverticulitis    Benign neoplasm of ascending colon    Osteoarthritis of both hands 09/04/2017   Chronic pain of left knee 09/04/2017   Lymphedema 07/07/2016   Chronic hepatitis C without hepatic coma ([place]) 06/01/2016   Lumbar herniated disc 11/24/2015   Thrombocytopenia (HCC) 11/11/2015   Hepatitis C antibody test positive 07/13/2015   Chronic constipation 07/13/2015   CN (constipation) 07/13/2015   Diabetes mellitus with coincident hypertension ([place]) 03/08/2015   Dyslipidemia 03/08/2015   Paranoid schizophrenia ([place]) 03/08/2015   Restless leg 03/08/2015   Callus of foot 03/08/2015   Claudication ([place]) 03/08/2015   Chronic  obstructive pulmonary disease ([place]) 03/08/2015   Corn or callus 03/08/2015   Essential (primary) hypertension 03/08/2015   HLD (hyperlipidemia) 03/08/2015   Peripheral vascular disease ([place]) 03/08/2015   Polyneuropathy 03/08/2015   Current tobacco use 03/08/2015   Vitamin D deficiency 04/26/2009   Avitaminosis D 04/26/2009    Immunization History  Administered Date(s) Administered   Fluad Quad(high Dose 65+) 04/28/2021   Influenza Inj Mdck Quad Pf 07/11/2019   Influenza, High Dose Seasonal PF 06/30/2016   Influenza,inj,Quad PF,6+ Mos 07/17/2017, 05/29/2018   Influenza-Unspecified 06/21/2020   PFIZER(Purple Top)SARS-COV-2 Vaccination 11/13/2019, 12/09/2019, 08/04/2020   PNEUMOCOCCAL CONJUGATE-20 09/19/2021   Pneumococcal Conjugate-13 07/13/2015   Pneumococcal Polysaccharide-23 12/23/2009, 03/29/2020   Tdap 12/23/2009, 03/29/2020   Zoster Recombinat (Shingrix) 07/23/2021    Conditions to be addressed/monitored:  Hypertension, Hyperlipidemia, Diabetes, Atrial Fibrillation, COPD, and Tobacco use  Care Plan : General Pharmacy (Adult)  Updates made by Germaine Pomfret, RPH since 02/22/2022 12:00 AM     Problem: Hypertension, Hyperlipidemia, Diabetes, Atrial Fibrillation, COPD, and Tobacco use   Priority: High     Long-Range Goal: Patient-Specific Goal   Start Date: 07/25/2021  Expected End Date: 07/25/2022  This Visit's Progress: On track  Recent Progress: On track  Priority: High  Note:   Current Barriers:  Unable to independently afford treatment regimen  Pharmacist Clinical Goal(s):  Patient will verbalize ability to afford treatment regimen achieve control of diabetes as evidenced by A1c less than 7% through collaboration with PharmD and provider.   Interventions: 1:1 collaboration with [name], MD regarding  development and update of comprehensive plan of care as evidenced by provider attestation and co-signature Inter-disciplinary care team collaboration  (see longitudinal plan of care) Comprehensive medication review performed; medication list updated in electronic medical record  Hypertension (BP goal <140/90) -Controlled -Current treatment: Diltiazem XR 240 mg daily -Medications previously tried: NA  -Current home readings: NA -Denies hypotensive/hypertensive symptoms -Recommended to continue current medication  Atrial Fibrillation (Goal: prevent stroke and major bleeding) -Controlled -CHADSVASC: 4 -Current treatment: Rate control: Diltiazem XR 240 mg daily Anticoagulation: Xarelto 20 mg daily -Medications previously tried: NA -Patient receiving Xarelto through [facility], but still has concerns regarding cost of medication. Will discuss with Dr. Quentin Ore the potential of switching patient to Eliquis and applying for patient assistance.  -Recommended to continue current medication for now   Hyperlipidemia: (LDL goal < 70) -Controlled -Current treatment: Pravastatin 20 mg daily -Medications previously tried: NA  -Recommended to continue current medication  Diabetes (A1c goal <7%) -Uncontrolled Diagnosed 20+ years ago -Current medications: Levemir 8 units nightly  -Medications previously tried: Catering manager, Januvia, Metformin,   -Current home glucose readings fasting glucose: patient has not been monitoring his blood sugars.  -Reports hypoglycemic/hyperglycemic symptoms -Current meal patterns: Eating out 2-3 times weekly, struggles with sweets (cookies). -Current exercise patterns: 1x weekly (20 minutes) limited by PVD. -Refill of glucometer kit to be filled by [facility] today.  -Recommended to continue current medication  COPD (Goal: control symptoms and prevent exacerbations) -Controlled -Current treatment  Trelegy 1 puff daily  -Medications previously tried: NA  -Exacerbations requiring treatment in last 6 months: Yes, hospitalized -Patient reports consistent use of maintenance inhaler.  -Frequency of rescue  inhaler use: None -Patient given Breztri sample today and instructed thoroughly on use. Patient was already approved for PAP for Breztri. -Finish supply of Trelegy then STOP Trelegy  -START Breztri 2 puffs twice daily via [place] Patient assistance paperwork   Tobacco use (Goal Quit smoking) -Uncontrolled -Previous quit attempts: None -Current treatment  1.5-2 ppd  -Patient is in a pre-contemplative stage of action and is not ready to quit.  -Provided contact information for [place] Quit Line (1-800-QUIT-NOW) and encouraged patient to reach out to this group for support.  Chronic Pain (Goal: Minimize pain symptoms) -Controlled -Managed by Dr. Humphrey Rolls  -Current treatment  Gabapentin 300 1 cap AM, 1 cap Noon, 2 caps PM Tizanidine 2 mg twice daily  Tramadol 50 mg twice daily  -Medications previously tried: NA  -Recommended to continue current medication  Patient Goals/Self-Care Activities Patient will:  - check glucose daily before breakfast, document, and provide at future appointments  Follow Up Plan: Telephone follow up appointment with care management team member scheduled for:  05/10/2022 at 1:00 PM     Medication Assistance:  Will re-apply for PAP at next follow-up.  Compliance/Adherence/Medication fill history: Care Gaps: Diabetic Eye Exam Zoster Vaccines Colonoscopy COVID-19 Vaccine  Star-Rating Drugs: Pravastatin 20 mg last filled on 10/11/21 for 90-DS  Patient's preferred pharmacy is:  [facility] [address] [place] [address] Phone: [phone] Fax: [place], [place] [address] [address] Phone: [phone] Fax: [place] [id number] [place], [place] [address] Phone: [phone] Fax: [phone]  [place] [address], [place] - [place] [address] [place] [address] Phone: [phone] Fax:  [place], [place] [address] [place] [address] Phone: [phone] Fax: [phone]   Uses pill box? Yes Pt endorses 100% compliance  We discussed: Current pharmacy is preferred with insurance plan and patient is satisfied with pharmacy services Patient decided to: Continue current medication management strategy  Care Plan and Follow Up Patient Decision:  Patient agrees to Care Plan and Follow-up.  Plan: Telephone follow up appointment with care management team member scheduled for:  05/10/2022 at 1:00 PM  Malva Limes, [place] Pharmacist Practitioner  [facility] [phone]

## 2022-02-22 NOTE — Patient Instructions (Addendum)
Visit Information It was great speaking with you today!  Please let me know if you have any questions about our visit.   Goals Addressed             This Visit's Progress    Monitor and Manage My Blood Sugar-Diabetes Type 2   Not on track    Timeframe:  Long-Range Goal Priority:  High Start Date: 07/25/2021                            Expected End Date: 07/25/2022                      Follow Up within 90 days   -check blood sugar at least 5 times throughout the day - check blood sugar before and after exercise - check blood sugar if I feel it is too high or too low - take the blood sugar meter to all doctor visits    Why is this important?   Checking your blood sugar at home helps to keep it from getting very high or very low.  Writing the results in a diary or log helps the doctor know how to care for you.  Your blood sugar log should have the time, date and the results.  Also, write down the amount of insulin or other medicine that you take.  Other information, like what you ate, exercise done and how you were feeling, will also be helpful.     Notes:      Quit Smoking   Not on track    If you wish to quit smoking, help is available. For free tobacco cessation program offerings call the [facility] at 706-582-4592 or Live Well Line at 830-836-9149. You may also visit [email or web address] or email [email or web address] for more information on other programs.          Patient Care Plan: General Pharmacy (Adult)     Problem Identified: Hypertension, Hyperlipidemia, Diabetes, Atrial Fibrillation, COPD, and Tobacco use   Priority: High     Long-Range Goal: Patient-Specific Goal   Start Date: 07/25/2021  Expected End Date: 07/25/2022  This Visit's Progress: On track  Recent Progress: On track  Priority: High  Note:   Current Barriers:  Unable to independently afford treatment regimen  Pharmacist Clinical Goal(s):  Patient will verbalize  ability to afford treatment regimen achieve control of diabetes as evidenced by A1c less than 7% through collaboration with PharmD and provider.   Interventions: 1:1 collaboration with [name], MD regarding development and update of comprehensive plan of care as evidenced by provider attestation and co-signature Inter-disciplinary care team collaboration (see longitudinal plan of care) Comprehensive medication review performed; medication list updated in electronic medical record  Hypertension (BP goal <140/90) -Controlled -Current treatment: Diltiazem XR 240 mg daily -Medications previously tried: NA  -Current home readings: NA -Denies hypotensive/hypertensive symptoms -Recommended to continue current medication  Atrial Fibrillation (Goal: prevent stroke and major bleeding) -Controlled -CHADSVASC: 4 -Current treatment: Rate control: Diltiazem XR 240 mg daily Anticoagulation: Xarelto 20 mg daily -Medications previously tried: NA -Patient receiving Xarelto through [facility], but still has concerns regarding cost of medication. Will discuss with Dr. Quentin Ore the potential of switching patient to Eliquis and applying for patient assistance.  -Recommended to continue current medication for now   Hyperlipidemia: (LDL goal < 70) -Controlled -Current treatment: Pravastatin 20 mg daily -Medications previously tried: NA  -Recommended to  continue current medication  Diabetes (A1c goal <7%) -Uncontrolled Diagnosed 20+ years ago -Current medications: Levemir 8 units nightly  -Medications previously tried: Catering manager, Januvia, Metformin,   -Current home glucose readings fasting glucose: patient has not been monitoring his blood sugars.  -Reports hypoglycemic/hyperglycemic symptoms -Current meal patterns: Eating out 2-3 times weekly, struggles with sweets (cookies). -Current exercise patterns: 1x weekly (20 minutes) limited by PVD. -Refill of glucometer kit to be filled by  [facility] today.  -Recommended to continue current medication  COPD (Goal: control symptoms and prevent exacerbations) -Controlled -Current treatment  Trelegy 1 puff daily  -Medications previously tried: NA  -Exacerbations requiring treatment in last 6 months: Yes, hospitalized -Patient reports consistent use of maintenance inhaler.  -Frequency of rescue inhaler use: None -Patient given Breztri sample today and instructed thoroughly on use. Patient was already approved for PAP for Breztri. -Finish supply of Trelegy then STOP Trelegy  -START Breztri 2 puffs twice daily via [place] Patient assistance paperwork   Tobacco use (Goal Quit smoking) -Uncontrolled -Previous quit attempts: None -Current treatment  1.5-2 ppd  -Patient is in a pre-contemplative stage of action and is not ready to quit.  -Provided contact information for [place] Quit Line (1-800-QUIT-NOW) and encouraged patient to reach out to this group for support.  Chronic Pain (Goal: Minimize pain symptoms) -Controlled -Managed by Dr. Humphrey Rolls  -Current treatment  Gabapentin 300 1 cap AM, 1 cap Noon, 2 caps PM Tizanidine 2 mg twice daily  Tramadol 50 mg twice daily  -Medications previously tried: NA  -Recommended to continue current medication  Patient Goals/Self-Care Activities Patient will:  - check glucose daily before breakfast, document, and provide at future appointments  Follow Up Plan: Telephone follow up appointment with care management team member scheduled for:  05/10/2022 at 1:00 PM    Patient agreed to services and verbal consent obtained.   The patient verbalized understanding of instructions, educational materials, and care plan provided today and DECLINED offer to receive copy of patient instructions, educational materials, and care plan.   Malva Limes, [employer] Pharmacist Practitioner  [facility] [phone]

## 2022-02-22 NOTE — Assessment & Plan Note (Signed)
STOP Trelegy START Breztri through [place] Patient Assistance

## 2022-02-23 MED ORDER — BLOOD GLUCOSE MONITOR KIT: PACK | 0 refills | Status: DC

## 2022-02-23 NOTE — Addendum Note (Signed)
Addended by: Daron Offer A on: 02/23/2022 05:02 PM   Modules accepted: Orders

## 2022-02-24 DIAGNOSIS — E785 Hyperlipidemia, unspecified: Secondary | ICD-10-CM | POA: Diagnosis not present

## 2022-02-24 DIAGNOSIS — F17210 Nicotine dependence, cigarettes, uncomplicated: Secondary | ICD-10-CM | POA: Diagnosis not present

## 2022-02-24 DIAGNOSIS — J449 Chronic obstructive pulmonary disease, unspecified: Secondary | ICD-10-CM | POA: Diagnosis not present

## 2022-02-24 DIAGNOSIS — Z794 Long term (current) use of insulin: Secondary | ICD-10-CM | POA: Diagnosis not present

## 2022-02-24 DIAGNOSIS — E1159 Type 2 diabetes mellitus with other circulatory complications: Secondary | ICD-10-CM

## 2022-02-24 DIAGNOSIS — I10 Essential (primary) hypertension: Secondary | ICD-10-CM

## 2022-02-24 DIAGNOSIS — I4891 Unspecified atrial fibrillation: Secondary | ICD-10-CM | POA: Diagnosis not present

## 2022-03-08 ENCOUNTER — Telehealth: Payer: Self-pay | Admitting: Cardiology

## 2022-03-08 NOTE — Telephone Encounter (Signed)
Received [place] patient assistance form for completion through fax Placed in nurse box

## 2022-03-14 NOTE — Progress Notes (Unsigned)
Name: Mark Nash   MRN: [id number]    DOB: 1956/03/06   Date:03/15/2022       Progress Note  Subjective  Chief Complaint  Follow Up  HPI  Lumbar radiculitis: he had back surgery in 06/2016 and 08/2016. He is currently doing okay. Currently seeing  Dr. Humphrey Rolls and is taking , Tramadol, Tizanidine and higher dose gabapentin 300 mg morning, lunch and 2 at night, his pain  is stable around 7/10 , it goes up when more active .    DMII: he is on Levemir 8 units, denies hypoglycemia, but still not  checking glucose, his A1C had gone up to 8.8 % but today is down to 7.7 % . He is on ACE , statins and gabapentin for neuropathy.  He also has PVD. He tried [employer] but bothered him and he stopped using it He denies polyphagia, polydipsia or polyuria   PAD: he had multiple episodes of cellulitis in 2022 and surgeon decided to hold off on surgical procedure , he is on medical management. He continues to have claudication but is better, able to walk for 20-30 minutes    COPD: he always has daily morning  productive cough. He has some SOB with activity but doing better since taking maintenance medication, currently on Brezti . He can tell when he skips medication. He is still smoking more now 1.5-2 packs per day, not ready to quit yet    Hyperlipidemia: taking Pravastatin 20 mg daily and denies myalgias. Last LDL was 43, continue medication   HTN: taking Diltiazem for afib and bp, he has intermittent chest pain but not as frequent and not as intense . Discussed when to call 911, specifically if lasts more than 10 minutes, associated with N/V/diaphoresis or radiation to jaw, back or arms. No palpitation, he has SOB that is multifactorial and present with activity    Schizophrenia: denies any hallucinations, no depressed mood or suicidal thoughts or ideation, not on medication and refuses to see Psychiatrist. He states he helps his family.  Mother died in February 23, 2023  and brother died 12-16-2021, he still  has a sister that he visits frequently    Hepatitis C antibody positive: he has a remote history of drug use from age 34 till 63. Used injectable drug use at the time, he has positive hepatitis C , he went to see GI but did not discuss hepatitis C. He has seen GI, he has a large polyp removed 2019 and was supposed to go back for repeat colonoscopy but he keeps postponing    Chronic Constipation: he states bowel movements have improvement   He had EGD and colonoscopy in 2019 , past due for repeat testing secondary to large sigmoid polyp,he had covid, followed by taking care of mother, brother and heart ablation, advised him to go back to have colonoscopy now - he states he has not scheduled due to problems with transportation. We will reach out to [facility] to see if she can help him out.    Senile purpura: both arms. on both upper extremity,he is on Xarelto   Afib: found on auscultation during office visit , CHADVasc score of 4, he is under the care of cardiologist  , rate controlled with Diltiazem and takes Xarelto , he states he continues to have nose bleeds but usually when he blows his nose forcefully, he is blowing softly and doing better   Patient Active Problem List   Diagnosis Date Noted  Persistent atrial fibrillation ([place]) 05/07/2021   Diabetes mellitus type 2 with peripheral artery disease ([place]) 03/16/2021   DDD (degenerative disc disease), lumbar 03/06/2019   Primary osteoarthritis involving multiple joints 03/06/2019   Lumbar radiculopathy 11/05/2018   Lumbar post-laminectomy syndrome 11/05/2018   Diabetic polyneuropathy associated with type 2 diabetes mellitus ([place]) 11/05/2018   CLE (columnar lined esophagus)    Gastric irritation    Duodenum ulcer    Personal history of colonic polyps    Polyp of sigmoid colon    Diverticulosis of large intestine without diverticulitis    Benign neoplasm of ascending colon    Osteoarthritis of both hands 09/04/2017   Chronic pain of left  knee 09/04/2017   Lymphedema 07/07/2016   Chronic hepatitis C without hepatic coma ([place]) 06/01/2016   Lumbar herniated disc 11/24/2015   Thrombocytopenia (HCC) 11/11/2015   Hepatitis C antibody test positive 07/13/2015   Chronic constipation 07/13/2015   CN (constipation) 07/13/2015   Diabetes mellitus with coincident hypertension ([place]) 03/08/2015   Dyslipidemia 03/08/2015   Paranoid schizophrenia ([place]) 03/08/2015   Restless leg 03/08/2015   Callus of foot 03/08/2015   Claudication ([place]) 03/08/2015   Chronic obstructive pulmonary disease ([place]) 03/08/2015   Corn or callus 03/08/2015   Essential (primary) hypertension 03/08/2015   HLD (hyperlipidemia) 03/08/2015   Peripheral vascular disease ([place]) 03/08/2015   Polyneuropathy 03/08/2015   Current tobacco use 03/08/2015   Vitamin D deficiency 04/26/2009   Avitaminosis D 04/26/2009    Past Surgical History:  Procedure Laterality Date   BACK SURGERY     CARDIOVERSION N/A 05/20/2021   Procedure: CARDIOVERSION;  Surgeon: Wellington Hampshire, MD;  Location: [facility];  Service: Cardiovascular;  Laterality: N/A;   CATARACT EXTRACTION W/PHACO Right 10/31/2021   Procedure: CATARACT EXTRACTION PHACO AND INTRAOCULAR LENS PLACEMENT ([place]) RIGHT DIABETIC;  Surgeon: Eulogio Bear, MD;  Location: [place];  Service: Ophthalmology;  Laterality: Right;  Diabetic 4.34 00:42.8   COLONOSCOPY WITH PROPOFOL N/A 09/07/2017   Procedure: COLONOSCOPY WITH PROPOFOL;  Surgeon: Virgel Manifold, MD;  Location: [facility];  Service: Endoscopy;  Laterality: N/A;   ESOPHAGOGASTRODUODENOSCOPY (EGD) WITH PROPOFOL N/A 12/07/2017   Procedure: ESOPHAGOGASTRODUODENOSCOPY (EGD) WITH PROPOFOL;  Surgeon: Virgel Manifold, MD;  Location: [facility];  Service: Endoscopy;  Laterality: N/A;   FLEXIBLE SIGMOIDOSCOPY N/A 11/09/2017   Procedure: FLEXIBLE SIGMOIDOSCOPY;  Surgeon: Virgel Manifold, MD;  Location: [facility];  Service: Endoscopy;   Laterality: N/A;   HERNIA REPAIR     inguinal   INGUINAL HERNIA REPAIR Bilateral 06/21/2018   Procedure: LAPAROSCOPIC BILATERAL INGUINAL HERNIA REPAIR;  Surgeon: Vickie Epley, MD;  Location: [facility];  Service: General;  Laterality: Bilateral;   L3 TO L5 LAMINECTOMY FOR DECOMPRESSION  07/17/2016   [facility]   REPAIR OF CEREBROSPINAL FLUID LEAK N/A 09/08/2016   Procedure: Lumbar wound exploration, repair of pseudomenigocele;  Surgeon: Kevan Ny Ditty, MD;  Location: [place];  Service: Neurosurgery;  Laterality: N/A;  Lumbar wound exploration, repair of pseudomenigocele   SINUSOTOMY      Family History  Problem Relation Age of Onset   Heart failure Mother    Hypertension Mother    Diverticulitis Mother    Colitis Mother    Diabetes Father    Hypertension Father    Heart disease Father    Diabetes Sister    Heart failure Brother    Diverticulitis Brother     Social History   Tobacco Use   Smoking  status: Every Day    Packs/day: 1.50    Years: 46.00    Total pack years: 69.00    Types: Cigarettes   Smokeless tobacco: Never  Substance Use Topics   Alcohol use: No    Alcohol/week: 0.0 standard drinks of alcohol     Current Outpatient Medications:    blood glucose meter kit and supplies KIT, Dispense based on patient and insurance preference. Use up to tid E11.65, LON:99, Disp: 1 each, Rfl: 0   Budeson-Glycopyrrol-Formoterol (BREZTRI AEROSPHERE) 160-9-4.8 MCG/ACT AERO, Inhale 2 puffs into the lungs 2 (two) times daily. Patient receives via [employer] Patient Assistance., Disp: 32.1 g, Rfl: 3   diltiazem (DILACOR XR) 240 MG 24 hr capsule, Take 1 capsule (240 mg total) by mouth daily., Disp: 90 capsule, Rfl: 3   ergocalciferol (VITAMIN D2) 1.25 MG (50000 UT) capsule, Take 1 capsule (50,000 Units total) by mouth every 14 (fourteen) days., Disp: 6 capsule, Rfl: 3   gabapentin (NEURONTIN) 300 MG capsule, Take 300-600 mg by mouth See admin instructions. Take 1  capsule (300 mg) by mouth in the morning, take 1 capsule (300 mg) by mouth at noon, and 2 capsules (600 mg) by mouth in the evening, Disp: , Rfl:    Lancet Devices MISC, 1 each by Does not apply route 3 (three) times daily as needed. What ins will cover dx:E11.65, LON:99 tid, Disp: 100 each, Rfl: 12   LEVEMIR 100 UNIT/ML injection, INJECT 8 UNITS SUBCUTANEOUSLY AT BEDTIME, Disp: 10 mL, Rfl: 0   pravastatin (PRAVACHOL) 20 MG tablet, Take 1 tablet (20 mg total) by mouth daily with supper., Disp: 90 tablet, Rfl: 3   rivaroxaban (XARELTO) 20 MG TABS tablet, Take 1 tablet (20 mg total) by mouth daily with supper., Disp: 90 tablet, Rfl: 1   tiZANidine (ZANAFLEX) 2 MG tablet, Take 2 mg by mouth every 12 (twelve) hours., Disp: , Rfl:    traMADol (ULTRAM) 50 MG tablet, Take 50 mg by mouth every 12 (twelve) hours., Disp: , Rfl:    furosemide (LASIX) 20 MG tablet, Take 1 tablet (20 mg total) by mouth daily. (Patient not taking: Reported on 03/15/2022), Disp: 30 tablet, Rfl: 1   glucose blood (FREESTYLE PRECISION NEO TEST) test strip, Check fsbs bid prn  E11.69 On insulin (Patient not taking: Reported on 03/15/2022), Disp: 100 each, Rfl: 12  Allergies  Allergen Reactions   Penicillins Hives and Swelling    As a child. Has patient had a PCN reaction causing immediate rash, facial/tongue/throat swelling, SOB or lightheadedness with hypotension: YES  Has patient had a PCN reaction causing severe rash involving mucus membranes or skin necrosis: UNKNOWN Has patient had a PCN reaction that required hospitalization: UNKNOWN Has patient had a PCN reaction occurring within the last 10 years: NO   Aspirin Nausea Only   Ibuprofen Nausea Only   Lyrica [Pregabalin] Nausea Only   Acetaminophen Nausea Only    I personally reviewed active problem list, medication list, allergies, family history, social history, health maintenance with the patient/caregiver today.   ROS  Constitutional: Negative for fever or weight  change.  Respiratory: positive  for cough and shortness of breath.   Cardiovascular: Negative for chest pain or palpitations.  Gastrointestinal: Negative for abdominal pain, no bowel changes.  Musculoskeletal: positive  for gait problem or joint swelling.  Skin: Negative for rash.  Neurological: Negative for dizziness or headache.  No other specific complaints in a complete review of systems (except as listed in HPI above).  Objective  Vitals:   03/15/22 1416  BP: 132/70  Pulse: 90  Resp: 16  SpO2: 96%  Weight: 163 lb (73.9 kg)  Height: _0  (1.803 m)    Body mass index is 22.73 kg/m.  Physical Exam  Constitutional: Patient appears well-developed and well-nourished.  No distress.  HEENT: head atraumatic, normocephalic, pupils equal and reactive to light, neck supple Cardiovascular: Normal rate, regular rhythm and normal heart sounds.  No murmur heard. No BLE edema. Pulmonary/Chest: Effort normal and breath sounds normal. No respiratory distress. Abdominal: Soft.  There is no tenderness. Psychiatric: Patient has a normal mood and affect. behavior is normal. Judgment and thought content normal.   PHQ2/9:    03/15/2022    2:15 PM 11/11/2021    2:03 PM 09/19/2021    1:38 PM 08/18/2021    1:37 PM 08/11/2021    1:04 PM  Depression screen PHQ 2/9  Decreased Interest 0 0 0 0 0  Down, Depressed, Hopeless 0 0 0 0 0  PHQ - 2 Score 0 0 0 0 0  Altered sleeping  0 0 0   Tired, decreased energy  0 0 0   Change in appetite  0 0 0   Feeling bad or failure about yourself   0 0 0   Trouble concentrating  0 0 0   Moving slowly or fidgety/restless  0 0 0   Suicidal thoughts  0 0 0   PHQ-9 Score  0 0 0   Difficult doing work/chores  Not difficult at all Not difficult at all Not difficult at all     phq 9 is negative   Fall Risk:    03/15/2022    2:15 PM 11/11/2021    2:03 PM 09/19/2021    1:38 PM 08/18/2021    1:37 PM 08/11/2021    1:04 PM  Fall Risk   Falls in the past  year? 0 1 0 0 0  Number falls in past yr: 0 0 0 0 0  Injury with Fall? 0 0 0 0 0  Risk for fall due to : No Fall Risks Impaired balance/gait     Follow up Falls prevention discussed Falls prevention discussed   Falls evaluation completed      Functional Status Survey: Is the patient deaf or have difficulty hearing?: No Does the patient have difficulty seeing, even when wearing glasses/contacts?: No Does the patient have difficulty concentrating, remembering, or making decisions?: No Does the patient have difficulty walking or climbing stairs?: Yes Does the patient have difficulty dressing or bathing?: No Does the patient have difficulty doing errands alone such as visiting a doctor's office or shopping?: No    Assessment & Plan  1. Type 2 diabetes mellitus with diabetic polyneuropathy, with long-term current use of insulin (HCC)  - POCT HgB A1C Continue Tresiba, A1C improved   2. Senile purpura (HCC)  On both arms   3. Claudication [facility])  Likely multifactorial from neurogenic and vascular   4. Chronic hepatitis C without hepatic coma ([place])   5. COPD, moderate ([place])  Stable   6. Peripheral vascular disease ([place])  On medical management   7. Persistent atrial fibrillation (HCC)  Rate controlled   8. Essential (primary) hypertension  At goal   9. Vitamin D deficiency   10. Current tobacco use  Not ready to quit  11. Dyslipidemia

## 2022-03-15 ENCOUNTER — Encounter: Payer: Self-pay | Admitting: Family Medicine

## 2022-03-15 ENCOUNTER — Other Ambulatory Visit: Payer: Self-pay | Admitting: Family Medicine

## 2022-03-15 ENCOUNTER — Ambulatory Visit (INDEPENDENT_AMBULATORY_CARE_PROVIDER_SITE_OTHER): Payer: PPO | Admitting: Family Medicine

## 2022-03-15 VITALS — BP 132/70 | HR 90 | Resp 16 | Ht 71.0 in | Wt 163.0 lb

## 2022-03-15 DIAGNOSIS — I739 Peripheral vascular disease, unspecified: Secondary | ICD-10-CM

## 2022-03-15 DIAGNOSIS — F200 Paranoid schizophrenia: Secondary | ICD-10-CM

## 2022-03-15 DIAGNOSIS — E559 Vitamin D deficiency, unspecified: Secondary | ICD-10-CM

## 2022-03-15 DIAGNOSIS — J449 Chronic obstructive pulmonary disease, unspecified: Secondary | ICD-10-CM

## 2022-03-15 DIAGNOSIS — B182 Chronic viral hepatitis C: Secondary | ICD-10-CM

## 2022-03-15 DIAGNOSIS — I10 Essential (primary) hypertension: Secondary | ICD-10-CM

## 2022-03-15 DIAGNOSIS — Z794 Long term (current) use of insulin: Secondary | ICD-10-CM

## 2022-03-15 DIAGNOSIS — E1142 Type 2 diabetes mellitus with diabetic polyneuropathy: Secondary | ICD-10-CM

## 2022-03-15 DIAGNOSIS — Z860100 Personal history of colon polyps, unspecified: Secondary | ICD-10-CM

## 2022-03-15 DIAGNOSIS — D692 Other nonthrombocytopenic purpura: Secondary | ICD-10-CM

## 2022-03-15 DIAGNOSIS — I4819 Other persistent atrial fibrillation: Secondary | ICD-10-CM

## 2022-03-15 DIAGNOSIS — E785 Hyperlipidemia, unspecified: Secondary | ICD-10-CM | POA: Diagnosis not present

## 2022-03-15 DIAGNOSIS — Z720 Tobacco use: Secondary | ICD-10-CM

## 2022-03-15 DIAGNOSIS — I482 Chronic atrial fibrillation, unspecified: Secondary | ICD-10-CM | POA: Insufficient documentation

## 2022-03-15 LAB — POCT GLYCOSYLATED HEMOGLOBIN (HGB A1C): Hemoglobin A1C: 7.7 % — AB (ref 4.0–5.6)

## 2022-03-16 ENCOUNTER — Telehealth: Payer: Self-pay | Admitting: Cardiology

## 2022-03-16 NOTE — Telephone Encounter (Signed)
Physician portion of the patient's application received from [facility] requesting that we complete this portion of the application for Eliquis assistance and fax to [employer].   I did reach out to Daron Offer, [facility] and inquire if the patient's portion of the application with his proof of income/ RX expense report was faxed already to [employer].  Per Alex: Thank you for reaching out and helping get his application approved. He brought in all the paperwork and documentation and I faxed it in on 03/08/22.  I'll have my assistant check on the status of the application next week and make sure they have everything they need. Thanks again  Physician portion of the application faxed to [facility] at [phone] Fax confirmation received.   Completed paperwork placed in the file cabinet for completed applications in the drug closet.

## 2022-03-17 ENCOUNTER — Telehealth: Payer: Self-pay | Admitting: *Deleted

## 2022-03-17 NOTE — Telephone Encounter (Signed)
   Telephone encounter was:  Unsuccessful.  03/17/2022 Name: Mark Nash MRN: [id number] DOB: [birth year]  Unsuccessful outbound call made today to assist with:  Transportation Needs   Outreach Attempt:  1st Attempt  A HIPAA compliant voice message was left requesting a return call.  Instructed patient to call back at   Instructed patient to call back at [phone]  at their earliest convenience. .  [place], Care Management  [phone] [address] , [place] [address] Email : [email or web address]

## 2022-03-20 ENCOUNTER — Telehealth: Payer: Self-pay | Admitting: *Deleted

## 2022-03-20 ENCOUNTER — Telehealth: Payer: Self-pay

## 2022-03-20 NOTE — Progress Notes (Signed)
    Chronic Care Management Pharmacy Assistant   Name: Mark Nash  MRN: [id number] DOB: 26-Feb-1956  Reason for Encounter: Patient Assistance Application follow-up for Breztri/Eliquis  I received a task from [name], CPP requesting that I check the status of the patient's assistance application for his [name] and Eliquis.  [employer] had already approved the patient for his [name] and was just waiting on a prescription from the provider to be sent to their pharmacy. Prescription was sent in, and I spoke with the patient this morning and he already received his 1st shipment.   I contacted [name] and they advised the patient had already been approved for his Eliquis and that the patient would need to contact their pharmacy at [phone] Option 6  to confirm his address for his 1st shipment.  I spoke to the patient but he stated they had already contacted him on Friday and everything was confirmed and they are in the process of shipping out his 1st shipment of Eliquis. Patient encouraged to give me a call if he does not have that shipment by the end of the week. Patient verbalized understanding.  Medications: Outpatient Encounter Medications as of 03/20/2022  Medication Sig   blood glucose meter kit and supplies KIT Dispense based on patient and insurance preference. Use up to tid E11.65, LON:99   Budeson-Glycopyrrol-Formoterol (BREZTRI AEROSPHERE) 160-9-4.8 MCG/ACT AERO Inhale 2 puffs into the lungs 2 (two) times daily. Patient receives via [employer] Patient Assistance.   diltiazem (DILACOR XR) 240 MG 24 hr capsule Take 1 capsule (240 mg total) by mouth daily.   ergocalciferol (VITAMIN D2) 1.25 MG (50000 UT) capsule Take 1 capsule (50,000 Units total) by mouth every 14 (fourteen) days.   gabapentin (NEURONTIN) 300 MG capsule Take 300-600 mg by mouth See admin instructions. Take 1 capsule (300 mg) by mouth in the morning, take 1 capsule (300 mg) by mouth at noon, and 2 capsules (600 mg)  by mouth in the evening   Lancet Devices MISC 1 each by Does not apply route 3 (three) times daily as needed. What ins will cover dx:E11.65, LON:99 tid   LEVEMIR 100 UNIT/ML injection INJECT 8 UNITS SUBCUTANEOUSLY AT BEDTIME   pravastatin (PRAVACHOL) 20 MG tablet Take 1 tablet (20 mg total) by mouth daily with supper.   rivaroxaban (XARELTO) 20 MG TABS tablet Take 1 tablet (20 mg total) by mouth daily with supper.   tiZANidine (ZANAFLEX) 2 MG tablet Take 2 mg by mouth every 12 (twelve) hours.   traMADol (ULTRAM) 50 MG tablet Take 50 mg by mouth every 12 (twelve) hours.   No facility-administered encounter medications on file as of 03/20/2022.    Lynann Bologna, CPA/CMA Clinical Pharmacist Assistant Phone: [phone]

## 2022-03-20 NOTE — Telephone Encounter (Signed)
   Telephone encounter was:  Successful.  03/20/2022 Name: Mark Nash MRN: [id number] DOB: September 16, 1955  Mark Nash is a 66 y.o. year old male who is a primary care patient of [name], MD . The community resource team was consulted for assistance with Transportation Needs   Care guide performed the following interventions: Patient provided with information about care guide support team and interviewed to confirm resource needs. Patient will need transportation when he schedules a colonoscopy  Is  a [facility] patient so we can provide transportation when he schedules the appointment presently the patient has no other needs Follow Up Plan:  No further follow up planned at this time. The patient has been provided with needed resources. [place], Care Management  [phone] [address] , [place] [address] Email : [email or web address]

## 2022-03-21 NOTE — Telephone Encounter (Signed)
Attempted to call the patient to verify if he was aware of the approval for Eliquis PA. No answer- I left a detailed message (ok per DPR) that his Eliquis has been approved and should be shipped directly to his home address. I asked that he call back with any further questions/ concerns.

## 2022-03-21 NOTE — Telephone Encounter (Signed)
**Note De-Identified Kentley Blyden Obfuscation** Letter received Cledith Abdou fax from [facility] stating that they have approved the pt for Eliquis asst until 08/27/2022. [id number]

## 2022-03-29 ENCOUNTER — Ambulatory Visit (INDEPENDENT_AMBULATORY_CARE_PROVIDER_SITE_OTHER): Payer: PPO

## 2022-03-29 ENCOUNTER — Ambulatory Visit (INDEPENDENT_AMBULATORY_CARE_PROVIDER_SITE_OTHER): Payer: PPO | Admitting: Nurse Practitioner

## 2022-03-29 ENCOUNTER — Encounter (INDEPENDENT_AMBULATORY_CARE_PROVIDER_SITE_OTHER): Payer: Self-pay | Admitting: Nurse Practitioner

## 2022-03-29 VITALS — BP 153/74 | HR 87 | Resp 16 | Ht 71.0 in | Wt 165.0 lb

## 2022-03-29 DIAGNOSIS — E1142 Type 2 diabetes mellitus with diabetic polyneuropathy: Secondary | ICD-10-CM

## 2022-03-29 DIAGNOSIS — I739 Peripheral vascular disease, unspecified: Secondary | ICD-10-CM

## 2022-03-29 DIAGNOSIS — Z794 Long term (current) use of insulin: Secondary | ICD-10-CM | POA: Diagnosis not present

## 2022-03-29 DIAGNOSIS — M5416 Radiculopathy, lumbar region: Secondary | ICD-10-CM

## 2022-03-29 DIAGNOSIS — M7989 Other specified soft tissue disorders: Secondary | ICD-10-CM

## 2022-04-16 ENCOUNTER — Encounter (INDEPENDENT_AMBULATORY_CARE_PROVIDER_SITE_OTHER): Payer: Self-pay | Admitting: Nurse Practitioner

## 2022-04-16 NOTE — Progress Notes (Signed)
Subjective:    Patient ID: Mark Nash, male    DOB: September 28, 1955, 66 y.o.   MRN: [id number] Chief Complaint  Patient presents with   New Patient (Initial Visit)     The patient is seen for evaluation of painful lower extremities and diminished pulses. Patient notes the pain is always associated with activity and is very consistent day today. Typically, the pain occurs at less than one block, progress is as activity continues to the point that the patient must stop walking. Resting including standing still for several minutes allows the patient to walk a similar distance before being forced to stop again. Uneven terrain and inclines shorten the distance. The pain has been progressive over the past several years. The patient denies any abrupt changes in claudication symptoms.  The patient states the inability to walk is causing problems with daily activities.  Endorses having swelling in his bilateral lower extremities.  He does wear compression is somewhat helpful.  He has a history of 2 back surgeries.  The patient denies rest pain or dangling of an extremity off the side of the bed during the night for relief. No open wounds or sores at this time. No prior interventions or surgeries.  History of back problems or DJD of the lumbar sacral spine.   The patient's blood pressure has been stable and relatively well controlled. The patient denies amaurosis fugax or recent TIA symptoms. There are no recent neurological changes noted. The patient denies history of DVT, PE or superficial thrombophlebitis. The patient denies recent episodes of angina or shortness of breath.    Today noninvasive studies show an ABI of 1.00 on the right and 1.16 on the left.  He has normal TBI's bilaterally he also has triphasic tibial waveforms bilaterally with normal toe waveforms bilaterally.    Review of Systems  Cardiovascular:  Positive for leg swelling.  All other systems reviewed and are negative.       Objective:   Physical Exam Vitals reviewed.  HENT:     Head: Normocephalic.  Cardiovascular:     Rate and Rhythm: Normal rate.     Pulses: Normal pulses.  Pulmonary:     Effort: Pulmonary effort is normal.  Skin:    General: Skin is warm and dry.  Neurological:     Mental Status: He is alert and oriented to person, place, and time.  Psychiatric:        Mood and Affect: Mood normal.        Behavior: Behavior normal.        Thought Content: Thought content normal.        Judgment: Judgment normal.     BP (!) 153/74 (BP Location: Left Arm)   Pulse 87   Resp 16   Ht '5\' 11"'  (1.803 m)   Wt 165 lb (74.8 kg)   BMI 23.01 kg/m   Past Medical History:  Diagnosis Date   Arthritis    Asthma    Benign neoplasm of ascending colon    polyps   Constipation    COPD (chronic obstructive pulmonary disease) (HCC)    NO INHALER   Diabetes mellitus without complication (HCC)    Type 2   Diverticulosis    Dyspnea    RARE-WITH EXERTION DUE TO COPD   GERD (gastroesophageal reflux disease)    Hepatitis C    History of kidney stones    H/O   Hyperlipidemia    Hypertension 06/10/2018   currently not under  control   Left inguinal hernia    Peripheral vascular disease (HCC)    Restless legs    Schizoaffective disorder (HCC)    Pt denies   Thrombocytopenia (HCC)    Vitamin D deficiency    Wears dentures    full upper and lower    Social History   Socioeconomic History   Marital status: Divorced    Spouse name: Not on file   Number of children: 2   Years of education: Not on file   Highest education level: 12th grade  Occupational History   Occupation: Disabled  Tobacco Use   Smoking status: Every Day    Packs/day: 1.50    Years: 46.00    Total pack years: 69.00    Types: Cigarettes   Smokeless tobacco: Never  Vaping Use   Vaping Use: Never used  Substance and Sexual Activity   Alcohol use: No    Alcohol/week: 0.0 standard drinks of alcohol   Drug use: No    Sexual activity: Not Currently  Other Topics Concern   Not on file  Social History Narrative   Lives alone   Social Determinants of Health   Financial Resource Strain: High Risk (10/25/2021)   Overall Financial Resource Strain (CARDIA)    Difficulty of Paying Living Expenses: Hard  Food Insecurity: No Food Insecurity (12/02/2020)   Hunger Vital Sign    Worried About Running Out of Food in the Last Year: Never true    Ran Out of Food in the Last Year: Never true  Transportation Needs: No Transportation Needs (03/20/2022)   PRAPARE - Hydrologist (Medical): No    Lack of Transportation (Non-Medical): No  Physical Activity: Insufficiently Active (12/02/2020)   Exercise Vital Sign    Days of Exercise per Week: 2 days    Minutes of Exercise per Session: 20 min  Stress: No Stress Concern Present (12/02/2020)   [place]    Feeling of Stress : Not at all  Social Connections: Socially Isolated (12/02/2020)   Social Connection and Isolation Panel [NHANES]    Frequency of Communication with Friends and Family: More than three times a week    Frequency of Social Gatherings with Friends and Family: Three times a week    Attends Religious Services: Never    Active Member of Clubs or Organizations: No    Attends Archivist Meetings: Never    Marital Status: Divorced  Human resources officer Violence: Not At Risk (12/02/2020)   Humiliation, Afraid, Rape, and Kick questionnaire    Fear of Current or Ex-Partner: No    Emotionally Abused: No    Physically Abused: No    Sexually Abused: No    Past Surgical History:  Procedure Laterality Date   BACK SURGERY     CARDIOVERSION N/A 05/20/2021   Procedure: CARDIOVERSION;  Surgeon: Wellington Hampshire, MD;  Location: [facility];  Service: Cardiovascular;  Laterality: N/A;   CATARACT EXTRACTION W/PHACO Right 10/31/2021   Procedure: CATARACT EXTRACTION PHACO AND  INTRAOCULAR LENS PLACEMENT ([place]) RIGHT DIABETIC;  Surgeon: Eulogio Bear, MD;  Location: [facility];  Service: Ophthalmology;  Laterality: Right;  Diabetic 4.34 00:42.8   COLONOSCOPY WITH PROPOFOL N/A 09/07/2017   Procedure: COLONOSCOPY WITH PROPOFOL;  Surgeon: Virgel Manifold, MD;  Location: [facility];  Service: Endoscopy;  Laterality: N/A;   ESOPHAGOGASTRODUODENOSCOPY (EGD) WITH PROPOFOL N/A 12/07/2017   Procedure: ESOPHAGOGASTRODUODENOSCOPY (EGD) WITH PROPOFOL;  Surgeon:  Virgel Manifold, MD;  Location: [facility];  Service: Endoscopy;  Laterality: N/A;   FLEXIBLE SIGMOIDOSCOPY N/A 11/09/2017   Procedure: FLEXIBLE SIGMOIDOSCOPY;  Surgeon: Virgel Manifold, MD;  Location: [facility];  Service: Endoscopy;  Laterality: N/A;   HERNIA REPAIR     inguinal   INGUINAL HERNIA REPAIR Bilateral 06/21/2018   Procedure: LAPAROSCOPIC BILATERAL INGUINAL HERNIA REPAIR;  Surgeon: Vickie Epley, MD;  Location: [facility];  Service: General;  Laterality: Bilateral;   L3 TO L5 LAMINECTOMY FOR DECOMPRESSION  07/17/2016   [facility]   REPAIR OF CEREBROSPINAL FLUID LEAK N/A 09/08/2016   Procedure: Lumbar wound exploration, repair of pseudomenigocele;  Surgeon: Kevan Ny Ditty, MD;  Location: [facility];  Service: Neurosurgery;  Laterality: N/A;  Lumbar wound exploration, repair of pseudomenigocele   SINUSOTOMY      Family History  Problem Relation Age of Onset   Heart failure Mother    Hypertension Mother    Diverticulitis Mother    Colitis Mother    Diabetes Father    Hypertension Father    Heart disease Father    Diabetes Sister    Heart failure Brother    Diverticulitis Brother     Allergies  Allergen Reactions   Penicillins Hives and Swelling    As a child. Has patient had a PCN reaction causing immediate rash, facial/tongue/throat swelling, SOB or lightheadedness with hypotension: YES  Has patient had a PCN reaction causing severe  rash involving mucus membranes or skin necrosis: UNKNOWN Has patient had a PCN reaction that required hospitalization: UNKNOWN Has patient had a PCN reaction occurring within the last 10 years: NO   Aspirin Nausea Only   Ibuprofen Nausea Only   Lyrica [Pregabalin] Nausea Only   Acetaminophen Nausea Only       Latest Ref Rng & Units 08/18/2021    2:17 PM 05/25/2021   12:16 PM 04/22/2021    4:56 AM  CBC  WBC 3.8 - 10.8 Thousand/uL 6.7  6.7  5.3   Hemoglobin 13.2 - 17.1 g/dL 14.9  15.2  15.9   Hematocrit 38.5 - 50.0 % 43.9  43.3  44.3   Platelets 140 - 400 Thousand/uL 184  167  132       CMP     Component Value Date/Time   NA 140 11/11/2021 1512   NA 140 05/25/2021 1216   K 3.7 11/11/2021 1512   CL 103 11/11/2021 1512   CO2 29 11/11/2021 1512   GLUCOSE 196 (H) 11/11/2021 1512   BUN 11 11/11/2021 1512   BUN 11 05/25/2021 1216   CREATININE 0.85 11/11/2021 1512   CALCIUM 9.0 11/11/2021 1512   PROT 6.1 11/11/2021 1512   PROT 5.9 (L) 11/10/2015 1045   ALBUMIN 4.1 03/30/2017 1204   ALBUMIN 4.3 11/10/2015 1045   AST 10 11/11/2021 1512   ALT 10 11/11/2021 1512   ALKPHOS 104 03/30/2017 1204   BILITOT 0.8 11/11/2021 1512   BILITOT 1.0 11/10/2015 1045   GFRNONAA >60 04/21/2021 0035   GFRNONAA 100 09/15/2020 1500   GFRAA 116 09/15/2020 1500     VAS [place] ABI WITH/WO TBI  Result Date: 04/06/2022  LOWER EXTREMITY DOPPLER STUDY Patient Name:  TORRANCE STOCKLEY  Date of Exam:   03/29/2022 Medical Rec #: [id number]        Accession #:    [id number] Date of Birth: 08-14-1956        Patient Gender: M Patient Age:   74 years Exam Location:  [facility] Procedure:      VAS [place] ABI WITH/WO TBI Referring Phys: --------------------------------------------------------------------------------  High Risk Factors: Diabetes.  Performing Technologist: Concha Norway RVT  Examination Guidelines: A complete evaluation includes at minimum, Doppler waveform signals and systolic blood pressure reading  at the level of bilateral brachial, anterior tibial, and posterior tibial arteries, when vessel segments are accessible. Bilateral testing is considered an integral part of a complete examination. Photoelectric Plethysmograph (PPG) waveforms and toe systolic pressure readings are included as required and additional duplex testing as needed. Limited examinations for reoccurring indications may be performed as noted.  ABI Findings: +---------+------------------+-----+---------+--------+ Right    Rt Pressure (mmHg)IndexWaveform Comment  +---------+------------------+-----+---------+--------+ Brachial 146                                      +---------+------------------+-----+---------+--------+ ATA      146               1.00 triphasic         +---------+------------------+-----+---------+--------+ PTA      143               0.98 triphasic         +---------+------------------+-----+---------+--------+ Great Toe151               1.03 Normal            +---------+------------------+-----+---------+--------+ +---------+------------------+-----+---------+-------+ Left     Lt Pressure (mmHg)IndexWaveform Comment +---------+------------------+-----+---------+-------+ Brachial 146                                     +---------+------------------+-----+---------+-------+ ATA      170               1.16 triphasic        +---------+------------------+-----+---------+-------+ PTA      168               1.15 triphasic        +---------+------------------+-----+---------+-------+ Great Toe147               1.01 Normal           +---------+------------------+-----+---------+-------+  Summary: Right: Resting right ankle-brachial index is within normal range. No evidence of significant right lower extremity arterial disease. The right toe-brachial index is normal. Left: Resting left ankle-brachial index is within normal range. No evidence of significant left lower extremity  arterial disease. The left toe-brachial index is normal. *See table(s) above for measurements and observations.  Electronically signed by Hortencia Pilar MD on 04/06/2022 at 4:05:55 PM.    Final        Assessment & Plan:   1. Claudication [facility]) Recommend:  The patient has atypical pain symptoms for vascular disease and on exam I do not find evidence of vascular pathology that would explain the patient's symptoms.  Noninvasive studies do not identify significant vascular problems  I suspect the patient is c/o pseudoclaudication.  Patient should have an evaluation of the LS spine which I defer to the primary service or the Spine service.  The patient should continue walking and begin a more formal exercise program.   2. Lumbar radiculopathy Based on patient's history of back pain, I suspect that his claudication-like symptoms are related to neurogenic claudication.  Patient will continue to follow with PCP.  3. Type 2 diabetes mellitus with diabetic  polyneuropathy, with long-term current use of insulin (HCC) Continue hypoglycemic medications as already ordered, these medications have been reviewed and there are no changes at this time.  Hgb A1C to be monitored as already arranged by primary service   4. Leg swelling Recommend:  I have had a long discussion with the patient regarding swelling and why it  causes symptoms.  Patient will begin wearing graduated compression on a daily basis a prescription was given. The patient will  wear the stockings first thing in the morning and removing them in the evening. The patient is instructed specifically not to sleep in the stockings.   In addition, behavioral modification will be initiated.  This will include frequent elevation, use of over the counter pain medications and exercise such as walking.  Consideration for a lymph pump will also be made based upon the effectiveness of conservative therapy.  This would help to improve the edema control  and prevent sequela such as ulcers and infections  Patient will follow-up in 6 months  Current Outpatient Medications on File Prior to Visit  Medication Sig Dispense Refill   blood glucose meter kit and supplies KIT Dispense based on patient and insurance preference. Use up to tid E11.65, LON:99 1 each 0   Budeson-Glycopyrrol-Formoterol (BREZTRI AEROSPHERE) 160-9-4.8 MCG/ACT AERO Inhale 2 puffs into the lungs 2 (two) times daily. Patient receives via [place] Patient Assistance. 32.1 g 3   diltiazem (DILACOR XR) 240 MG 24 hr capsule Take 1 capsule (240 mg total) by mouth daily. 90 capsule 3   ergocalciferol (VITAMIN D2) 1.25 MG (50000 UT) capsule Take 1 capsule (50,000 Units total) by mouth every 14 (fourteen) days. 6 capsule 3   gabapentin (NEURONTIN) 300 MG capsule Take 300-600 mg by mouth See admin instructions. Take 1 capsule (300 mg) by mouth in the morning, take 1 capsule (300 mg) by mouth at noon, and 2 capsules (600 mg) by mouth in the evening     Lancet Devices MISC 1 each by Does not apply route 3 (three) times daily as needed. What ins will cover dx:E11.65, LON:99 tid 100 each 12   LEVEMIR 100 UNIT/ML injection INJECT 8 UNITS SUBCUTANEOUSLY AT BEDTIME 10 mL 0   pravastatin (PRAVACHOL) 20 MG tablet Take 1 tablet (20 mg total) by mouth daily with supper. 90 tablet 3   rivaroxaban (XARELTO) 20 MG TABS tablet Take 1 tablet (20 mg total) by mouth daily with supper. 90 tablet 1   tiZANidine (ZANAFLEX) 2 MG tablet Take 2 mg by mouth every 12 (twelve) hours.     traMADol (ULTRAM) 50 MG tablet Take 50 mg by mouth every 12 (twelve) hours.     No current facility-administered medications on file prior to visit.    There are no Patient Instructions on file for this visit. No follow-ups on file.   Kris Hartmann, NP

## 2022-04-19 DIAGNOSIS — B351 Tinea unguium: Secondary | ICD-10-CM | POA: Diagnosis not present

## 2022-04-19 DIAGNOSIS — L851 Acquired keratosis [keratoderma] palmaris et plantaris: Secondary | ICD-10-CM | POA: Diagnosis not present

## 2022-04-19 DIAGNOSIS — E1142 Type 2 diabetes mellitus with diabetic polyneuropathy: Secondary | ICD-10-CM | POA: Diagnosis not present

## 2022-04-19 LAB — HM DIABETES FOOT EXAM: HM Diabetic Foot Exam: NORMAL

## 2022-05-10 ENCOUNTER — Ambulatory Visit (INDEPENDENT_AMBULATORY_CARE_PROVIDER_SITE_OTHER): Payer: PPO

## 2022-05-10 ENCOUNTER — Other Ambulatory Visit: Payer: Self-pay

## 2022-05-10 ENCOUNTER — Other Ambulatory Visit: Payer: Self-pay | Admitting: Family Medicine

## 2022-05-10 DIAGNOSIS — E1142 Type 2 diabetes mellitus with diabetic polyneuropathy: Secondary | ICD-10-CM

## 2022-05-10 DIAGNOSIS — I482 Chronic atrial fibrillation, unspecified: Secondary | ICD-10-CM

## 2022-05-10 DIAGNOSIS — J449 Chronic obstructive pulmonary disease, unspecified: Secondary | ICD-10-CM

## 2022-05-10 DIAGNOSIS — Z720 Tobacco use: Secondary | ICD-10-CM

## 2022-05-10 DIAGNOSIS — I4891 Unspecified atrial fibrillation: Secondary | ICD-10-CM

## 2022-05-10 MED ORDER — RIVAROXABAN 20 MG PO TABS: 20.0000 mg | ORAL_TABLET | Freq: Every day | ORAL | 1 refills | Status: DC

## 2022-05-10 MED ORDER — BLOOD GLUCOSE MONITOR KIT: PACK | 0 refills | Status: AC

## 2022-05-10 NOTE — Telephone Encounter (Signed)
Prescription refill request for Xarelto received.  Indication: Afib  Last office visit: 02/15/22 [facility])  Weight: 74.8kg Age: 66 Scr: 0.85 (11/11/21)  CrCl: 89.70m/min  Appropriate dose and refill sent to requested pharmacy.

## 2022-05-10 NOTE — Progress Notes (Signed)
Chronic Care Management Pharmacy Note  05/11/2022 Name:  Mark Nash MRN:  [id number] DOB:  17-Aug-1956  Summary: Patient presents for CCM follow-up. Patient is now receiving Breztri and Eliquis through patient assistance.   Recommendations/Changes made from today's visit: Continue current medications.  Plan: CPP follow-up in 3 months  Subjective: Mark Nash is an 66 y.o. year old male who is a primary patient of [name], MD.  The CCM team was consulted for assistance with disease management and care coordination needs.    Engaged with patient by telephone for follow up visit in response to provider referral for pharmacy case management and/or care coordination services.   Consent to Services:  The patient was given information about Chronic Care Management services, agreed to services, and gave verbal consent prior to initiation of services.  Please see initial visit note for detailed documentation.   Patient Care Team: [name], MD as PCP - General (Family Medicine) Vickie Epley, MD as PCP - Electrophysiology (Cardiology) Minna Merritts, MD as PCP - Cardiology (Cardiology) Virgel Manifold, MD (Inactive) as Consulting Physician (Gastroenterology) Leim Fabry, MD as Consulting Physician (Orthopedic Surgery) Caroline More, DPM as Consulting Physician (Podiatry) Nathanial Rancher, MD as Referring Physician (Pain Medicine) End, Harrell Gave, MD as Consulting Physician (Cardiology) Germaine Pomfret, [facility] (Pharmacist)  Recent office visits: 11/11/21: Patient presented to Dr. Ancil Boozer for follow-up. A1c 8.8%.  09/19/21: Patient presented to Dr. Rosana Berger for follow-up.   Recent consult visits: 02/15/22: Patient presented to Dr. Quentin Ore (Cardiology) for follow-up.   Hospital visits: None in past 6 months.   Objective:  Lab Results  Component Value Date   CREATININE 0.85 11/11/2021   BUN 11 11/11/2021   GFRNONAA >60 04/21/2021   GFRAA 116  09/15/2020   NA 140 11/11/2021   K 3.7 11/11/2021   CALCIUM 9.0 11/11/2021   CO2 29 11/11/2021   GLUCOSE 196 (H) 11/11/2021    Lab Results  Component Value Date/Time   HGBA1C 7.7 (A) 03/15/2022 02:29 PM   HGBA1C 8.8 (H) 11/11/2021 03:12 PM   HGBA1C 7.6 (A) 07/13/2021 01:25 PM   HGBA1C 7.1 (H) 11/10/2019 02:27 PM   HGBA1C 6.6 07/11/2019 01:45 PM   HGBA1C 6.8 03/10/2019 10:08 AM   MICROALBUR 2.4 11/11/2021 03:12 PM   MICROALBUR 0.2 09/15/2020 03:00 PM   MICROALBUR 50 07/17/2017 02:17 PM   MICROALBUR negative 03/11/2015 10:59 AM    Last diabetic Eye exam:  Lab Results  Component Value Date/Time   HMDIABEYEEXA No Retinopathy 08/31/2021 12:00 AM    Last diabetic Foot exam:  Lab Results  Component Value Date/Time   HMDIABFOOTEX Normal 02/11/2018 12:00 AM     Lab Results  Component Value Date   CHOL 106 11/11/2021   HDL 50 11/11/2021   LDLCALC 43 11/11/2021   TRIG 46 11/11/2021   CHOLHDL 2.1 11/11/2021       Latest Ref Rng & Units 11/11/2021    3:12 PM 09/15/2020    3:00 PM 11/10/2019    2:27 PM  Hepatic Function  Total Protein 6.1 - 8.1 g/dL 6.1  6.5  6.3   AST 10 - 35 U/L _0 ALT 9 - 46 U/L _1 Total Bilirubin 0.2 - 1.2 mg/dL 0.8  1.2  0.9     Lab Results  Component Value Date/Time   TSH 1.706 04/19/2021 10:04 PM       Latest Ref Rng & Units 08/18/2021  2:17 PM 05/25/2021   12:16 PM 04/22/2021    4:56 AM  CBC  WBC 3.8 - 10.8 Thousand/uL 6.7  6.7  5.3   Hemoglobin 13.2 - 17.1 g/dL 14.9  15.2  15.9   Hematocrit 38.5 - 50.0 % 43.9  43.3  44.3   Platelets 140 - 400 Thousand/uL 184  167  132     Lab Results  Component Value Date/Time   VD25OH 26 (L) 03/30/2017 12:04 PM   VD25OH 26 (L) 12/15/2016 02:05 PM    Clinical ASCVD: No  The ASCVD Risk score (Arnett DK, et al., 2019) failed to calculate for the following reasons:   The valid total cholesterol range is 130 to 320 mg/dL       03/15/2022    2:15 PM 11/11/2021    2:03 PM  09/19/2021    1:38 PM  Depression screen PHQ 2/9  Decreased Interest 0 0 0  Down, Depressed, Hopeless 0 0 0  PHQ - 2 Score 0 0 0  Altered sleeping  0 0  Tired, decreased energy  0 0  Change in appetite  0 0  Feeling bad or failure about yourself   0 0  Trouble concentrating  0 0  Moving slowly or fidgety/restless  0 0  Suicidal thoughts  0 0  PHQ-9 Score  0 0  Difficult doing work/chores  Not difficult at all Not difficult at all    Social History   Tobacco Use  Smoking Status Every Day   Packs/day: 1.50   Years: 46.00   Total pack years: 69.00   Types: Cigarettes  Smokeless Tobacco Never   BP Readings from Last 3 Encounters:  03/29/22 (!) 153/74  03/15/22 132/70  02/15/22 136/84   Pulse Readings from Last 3 Encounters:  03/29/22 87  03/15/22 90  02/15/22 89   Wt Readings from Last 3 Encounters:  03/29/22 165 lb (74.8 kg)  03/15/22 163 lb (73.9 kg)  02/15/22 164 lb (74.4 kg)   BMI Readings from Last 3 Encounters:  03/29/22 23.01 kg/m  03/15/22 22.73 kg/m  02/15/22 22.87 kg/m    Assessment/Interventions: Review of patient past medical history, allergies, medications, health status, including review of consultants reports, laboratory and other test data, was performed as part of comprehensive evaluation and provision of chronic care management services.   SDOH:  (Social Determinants of Health) assessments and interventions performed: Yes SDOH Interventions    Flowsheet Row Telephone from 03/20/2022 in [place] Management from 09/28/2021 in [place] Management from 08/03/2021 in [place] Management from 07/20/2021 in [facility]  SDOH Interventions      Transportation Interventions Intervention Not Indicated -- -- --  Financial Strain Interventions -- Other (Comment)  [PAP] Other (Comment)  [PAP] Other (Comment)  [PAP]         SDOH Screenings   Food Insecurity: No Food Insecurity (12/02/2020)  Housing: Low Risk  (12/02/2020)  Transportation Needs: No Transportation Needs (03/20/2022)  Alcohol Screen: Low Risk  (08/11/2021)  Depression (PHQ2-9): Low Risk  (03/15/2022)  Financial Resource Strain: High Risk (10/25/2021)  Physical Activity: Insufficiently Active (12/02/2020)  Social Connections: Socially Isolated (12/02/2020)  Stress: No Stress Concern Present (12/02/2020)  Tobacco Use: High Risk (04/16/2022)    CCM Care Plan  Allergies  Allergen Reactions   Penicillins Hives and Swelling    As a child. Has patient had a PCN reaction causing immediate  rash, facial/tongue/throat swelling, SOB or lightheadedness with hypotension: YES  Has patient had a PCN reaction causing severe rash involving mucus membranes or skin necrosis: UNKNOWN Has patient had a PCN reaction that required hospitalization: UNKNOWN Has patient had a PCN reaction occurring within the last 10 years: NO   Aspirin Nausea Only   Ibuprofen Nausea Only   Lyrica [Pregabalin] Nausea Only   Acetaminophen Nausea Only    Medications Reviewed Today     Reviewed by Germaine Pomfret, [facility] (Pharmacist) on 05/11/22 at 1128  Med List Status: <None>   Medication Order Taking? Sig Documenting Provider Last Dose Status Informant  apixaban (ELIQUIS) 5 MG TABS tablet [id number] Yes Take 5 mg by mouth 2 (two) times daily. Patient receives via [name] patient assistance through Dec 2023 [provider] Taking Active   blood glucose meter kit and supplies KIT [id number]  Dispense based on patient and insurance preference. Use up to tid E11.65, LON:99 [name], MD  Active   Budeson-Glycopyrrol-Formoterol (BREZTRI AEROSPHERE) 160-9-4.8 MCG/ACT Hollie Salk [id number] Yes Inhale 2 puffs into the lungs 2 (two) times daily. Patient receives via [employer] Patient Assistance. [name], MD Taking Active   diltiazem (DILACOR XR) 240 MG 24 hr capsule  [id number]  Take 1 capsule (240 mg total) by mouth daily. [name], MD  Expired 05/01/22 2359 Self  ergocalciferol (VITAMIN D2) 1.25 MG (50000 UT) capsule [id number]  Take 1 capsule (50,000 Units total) by mouth every 14 (fourteen) days. [name], MD  Active   gabapentin (NEURONTIN) 300 MG capsule [id number]  Take 300-600 mg by mouth See admin instructions. Take 1 capsule (300 mg) by mouth in the morning, take 1 capsule (300 mg) by mouth at noon, and 2 capsules (600 mg) by mouth in the evening [provider]  Active Self  Lancet [employer] [place] [id number]  1 each by Does not apply route 3 (three) times daily as needed. What ins will cover dx:E11.65, LON:99 tid Teodora Medici, DO  Active   LEVEMIR 100 UNIT/ML injection [id number]  INJECT 8 UNITS SUBCUTANEOUSLY AT BEDTIME [name], MD  Active   pravastatin (PRAVACHOL) 20 MG tablet [id number]  Take 1 tablet (20 mg total) by mouth daily with supper. [name], MD  Active   tiZANidine (ZANAFLEX) 2 MG tablet [id number]  Take 2 mg by mouth every 12 (twelve) hours. Allyne Gee, MD  Active Self  traMADol Veatrice [place]bon) 50 MG tablet [id number]  Take 50 mg by mouth every 12 (twelve) hours. Allyne Gee, MD  Active Self  Med List Note Al Decant, RN 10/03/18 1438): UDS 10/03/18            Patient Active Problem List   Diagnosis Date Noted   Chronic a-fib ([place]) 03/15/2022   Persistent atrial fibrillation ([place]) 05/07/2021   Type 2 diabetes mellitus with diabetic polyneuropathy, with long-term current use of insulin ([place]) 03/16/2021   DDD (degenerative disc disease), lumbar 03/06/2019   Primary osteoarthritis involving multiple joints 03/06/2019   Lumbar radiculopathy 11/05/2018   Lumbar post-laminectomy syndrome 11/05/2018   Diabetic polyneuropathy associated with type 2 diabetes mellitus ([place]) 11/05/2018   CLE (columnar lined esophagus)    Gastric irritation    Duodenum ulcer    Personal history of colonic polyps     Polyp of sigmoid colon    Diverticulosis of large intestine without diverticulitis    Benign neoplasm of ascending colon    Osteoarthritis of both hands 09/04/2017   Chronic pain of left knee 09/04/2017  Lymphedema 07/07/2016   Chronic hepatitis C without hepatic coma ([place]) 06/01/2016   Lumbar herniated disc 11/24/2015   Thrombocytopenia (H[facility]) 11/11/2015   Hepatitis C antibody test positive 07/13/2015   Chronic constipation 07/13/2015   CN (constipation) 07/13/2015   Diabetes mellitus with coincident hypertension ([place]) 03/08/2015   Dyslipidemia 03/08/2015   Paranoid schizophrenia ([place]) 03/08/2015   Restless leg 03/08/2015   Callus of foot 03/08/2015   Claudication ([place]) 03/08/2015   Chronic obstructive pulmonary disease ([place]) 03/08/2015   Corn or callus 03/08/2015   Essential (primary) hypertension 03/08/2015   HLD (hyperlipidemia) 03/08/2015   Peripheral vascular disease ([place]) 03/08/2015   Polyneuropathy 03/08/2015   Current tobacco use 03/08/2015   Vitamin D deficiency 04/26/2009   Avitaminosis D 04/26/2009    Immunization History  Administered Date(s) Administered   Fluad Quad(high Dose 65+) 04/28/2021   Influenza Inj Mdck Quad Pf 07/11/2019   Influenza, High Dose Seasonal PF 06/30/2016   Influenza,inj,Quad PF,6+ Mos 07/17/2017, 05/29/2018   Influenza-Unspecified 06/21/2020   PFIZER(Purple Top)SARS-COV-2 Vaccination 11/13/2019, 12/09/2019, 08/04/2020   PNEUMOCOCCAL CONJUGATE-20 09/19/2021   Pneumococcal Conjugate-13 07/13/2015   Pneumococcal Polysaccharide-23 12/23/2009, 03/29/2020   Tdap 12/23/2009, 03/29/2020   Zoster Recombinat (Shingrix) 03/16/2021, 07/23/2021    Conditions to be addressed/monitored:  Hypertension, Hyperlipidemia, Diabetes, Atrial Fibrillation, COPD, and Tobacco use  Care Plan : General Pharmacy (Adult)  Updates made by Germaine Pomfret, RPH since 05/11/2022 12:00 AM     Problem: Hypertension, Hyperlipidemia, Diabetes, Atrial Fibrillation,  COPD, and Tobacco use   Priority: High     Long-Range Goal: Patient-Specific Goal   Start Date: 07/25/2021  Expected End Date: 05/12/2023  This Visit's Progress: On track  Recent Progress: On track  Priority: High  Note:   Current Barriers:  Unable to independently afford treatment regimen  Pharmacist Clinical Goal(s):  Patient will verbalize ability to afford treatment regimen achieve control of diabetes as evidenced by A1c less than 7% through collaboration with PharmD and provider.   Interventions: 1:1 collaboration with [name], MD regarding development and update of comprehensive plan of care as evidenced by provider attestation and co-signature Inter-disciplinary care team collaboration (see longitudinal plan of care) Comprehensive medication review performed; medication list updated in electronic medical record  Hypertension (BP goal <140/90) -Controlled -Current treatment: Diltiazem XR 240 mg daily -Medications previously tried: NA  -Current home readings: NA -Denies hypotensive/hypertensive symptoms -Recommended to continue current medication  Atrial Fibrillation (Goal: prevent stroke and major bleeding) -Controlled -CHADSVASC: 4 -Current treatment: Rate control: Diltiazem XR 240 mg daily Anticoagulation: Eliquis 5 mg twice daily  -Medications previously tried: NA -Recommended to continue current medications   Hyperlipidemia: (LDL goal < 70) -Controlled -Current treatment: Pravastatin 20 mg daily -Medications previously tried: NA  -Recommended to continue current medication  Diabetes (A1c goal <7%) -Uncontrolled Diagnosed 20+ years ago -Current medications: Levemir 8 units nightly  -Medications previously tried: Catering manager, Januvia, Metformin,   -Current home glucose readings fasting glucose: patient has not been monitoring his blood sugars.  -Reports hypoglycemic/hyperglycemic symptoms -Current meal patterns: Eating out 2-3 times weekly,  struggles with sweets (cookies). -Current exercise patterns: 1x weekly (20 minutes) limited by PVD. -Refill of glucometer kit to be filled by [facility] today.  -Recommended to continue current medication  COPD (Goal: control symptoms and prevent exacerbations) -Controlled -Current treatment  Breztri 2 puffs twice daily  -Medications previously tried: NA  -Exacerbations requiring treatment in last 6 months: Yes, hospitalized -Patient reports consistent use of maintenance inhaler. Headache if taking more than  4 days in a row. Seems to be improving the longer he is on the medication.  -Frequency of rescue inhaler use: None -Continue current medications   Tobacco use (Goal Quit smoking) -Uncontrolled -Previous quit attempts: None -Current treatment  1.5-2 ppd  -Patient is in a pre-contemplative stage of action and is not ready to quit.  -Provided contact information for [place] Quit Line (1-800-QUIT-NOW) and encouraged patient to reach out to this group for support.  Chronic Pain (Goal: Minimize pain symptoms) -Controlled -Managed by Dr. Humphrey Rolls  -Current treatment  Gabapentin 300 1 cap AM, 1 cap Noon, 2 caps PM Tizanidine 2 mg twice daily  Tramadol 50 mg twice daily  -Medications previously tried: NA  -Recommended to continue current medication  Patient Goals/Self-Care Activities Patient will:  - check glucose daily before breakfast, document, and provide at future appointments  Follow Up Plan: Telephone follow up appointment with care management team member scheduled for:  11/28/2021 at 3:00 PM    Medication Assistance:  Will re-apply for PAP at next follow-up.  Compliance/Adherence/Medication fill history: Care Gaps: Diabetic Eye Exam Zoster Vaccines Colonoscopy COVID-19 Vaccine  Star-Rating Drugs: Pravastatin 20 mg last filled on 10/11/21 for 90-DS  Patient's preferred pharmacy is:  [facility] [address] [place]  [address] Phone: [phone] Fax: [place], [place] [address] [address] Phone: [phone] Fax: [place] [id number] [place], [place] [address] Phone: [phone] Fax: [phone]  [place] [address] [place] [address] [place] [address] Phone: [phone] Fax: [place], [place]. [place] [address] Phone: [phone] Fax: [phone]  Uses pill box? Yes Pt endorses 100% compliance  We discussed: Current pharmacy is preferred with insurance plan and patient is satisfied with pharmacy services Patient decided to: Continue current medication management strategy  Care Plan and Follow Up Patient Decision:  Patient agrees to Care Plan and Follow-up.  Plan: Telephone follow up appointment with care management team member scheduled for:  11/28/2021 at 3:00 PM  Malva Limes, [place] Pharmacist Practitioner  [facility] [phone]

## 2022-05-11 NOTE — Patient Instructions (Signed)
Visit Information It was great speaking with you today!  Please let me know if you have any questions about our visit.   Goals Addressed             This Visit's Progress    Monitor and Manage My Blood Sugar-Diabetes Type 2   Not on track    Timeframe:  Long-Range Goal Priority:  High Start Date: 07/25/2021                            Expected End Date: 07/25/2022                      Follow Up within 90 days   -check blood sugar at least 5 times throughout the day - check blood sugar before and after exercise - check blood sugar if I feel it is too high or too low - take the blood sugar meter to all doctor visits    Why is this important?   Checking your blood sugar at home helps to keep it from getting very high or very low.  Writing the results in a diary or log helps the doctor know how to care for you.  Your blood sugar log should have the time, date and the results.  Also, write down the amount of insulin or other medicine that you take.  Other information, like what you ate, exercise done and how you were feeling, will also be helpful.     Notes:      Quit Smoking   Not on track    If you wish to quit smoking, help is available. For free tobacco cessation program offerings call the [facility] at (325) 886-5683 or Live Well Line at 207 582 0469. You may also visit [email or web address] or email [email or web address] for more information on other programs.          Patient Care Plan: General Pharmacy (Adult)     Problem Identified: Hypertension, Hyperlipidemia, Diabetes, Atrial Fibrillation, COPD, and Tobacco use   Priority: High     Long-Range Goal: Patient-Specific Goal   Start Date: 07/25/2021  Expected End Date: 05/12/2023  This Visit's Progress: On track  Recent Progress: On track  Priority: High  Note:   Current Barriers:  Unable to independently afford treatment regimen  Pharmacist Clinical Goal(s):  Patient will verbalize  ability to afford treatment regimen achieve control of diabetes as evidenced by A1c less than 7% through collaboration with PharmD and provider.   Interventions: 1:1 collaboration with [name], MD regarding development and update of comprehensive plan of care as evidenced by provider attestation and co-signature Inter-disciplinary care team collaboration (see longitudinal plan of care) Comprehensive medication review performed; medication list updated in electronic medical record  Hypertension (BP goal <140/90) -Controlled -Current treatment: Diltiazem XR 240 mg daily -Medications previously tried: NA  -Current home readings: NA -Denies hypotensive/hypertensive symptoms -Recommended to continue current medication  Atrial Fibrillation (Goal: prevent stroke and major bleeding) -Controlled -CHADSVASC: 4 -Current treatment: Rate control: Diltiazem XR 240 mg daily Anticoagulation: Eliquis 5 mg twice daily  -Medications previously tried: NA -Recommended to continue current medications   Hyperlipidemia: (LDL goal < 70) -Controlled -Current treatment: Pravastatin 20 mg daily -Medications previously tried: NA  -Recommended to continue current medication  Diabetes (A1c goal <7%) -Uncontrolled Diagnosed 20+ years ago -Current medications: Levemir 8 units nightly  -Medications previously tried: Catering manager, Januvia, Metformin,   -Current home glucose readings  fasting glucose: patient has not been monitoring his blood sugars.  -Reports hypoglycemic/hyperglycemic symptoms -Current meal patterns: Eating out 2-3 times weekly, struggles with sweets (cookies). -Current exercise patterns: 1x weekly (20 minutes) limited by PVD. -Refill of glucometer kit to be filled by [facility] today.  -Recommended to continue current medication  COPD (Goal: control symptoms and prevent exacerbations) -Controlled -Current treatment  Breztri 2 puffs twice daily  -Medications previously tried: NA   -Exacerbations requiring treatment in last 6 months: Yes, hospitalized -Patient reports consistent use of maintenance inhaler. Headache if taking more than 4 days in a row. Seems to be improving the longer he is on the medication.  -Frequency of rescue inhaler use: None -Continue current medications   Tobacco use (Goal Quit smoking) -Uncontrolled -Previous quit attempts: None -Current treatment  1.5-2 ppd  -Patient is in a pre-contemplative stage of action and is not ready to quit.  -Provided contact information for [place] Quit Line (1-800-QUIT-NOW) and encouraged patient to reach out to this group for support.  Chronic Pain (Goal: Minimize pain symptoms) -Controlled -Managed by Dr. Humphrey Rolls  -Current treatment  Gabapentin 300 1 cap AM, 1 cap Noon, 2 caps PM Tizanidine 2 mg twice daily  Tramadol 50 mg twice daily  -Medications previously tried: NA  -Recommended to continue current medication  Patient Goals/Self-Care Activities Patient will:  - check glucose daily before breakfast, document, and provide at future appointments  Follow Up Plan: Telephone follow up appointment with care management team member scheduled for:  11/28/2021 at 3:00 PM    Patient agreed to services and verbal consent obtained.   The patient verbalized understanding of instructions, educational materials, and care plan provided today and DECLINED offer to receive copy of patient instructions, educational materials, and care plan.   Malva Limes, [place] Pharmacist Practitioner  [facility] [phone]

## 2022-05-12 ENCOUNTER — Other Ambulatory Visit: Payer: Self-pay | Admitting: Internal Medicine

## 2022-05-22 DIAGNOSIS — M961 Postlaminectomy syndrome, not elsewhere classified: Secondary | ICD-10-CM | POA: Diagnosis not present

## 2022-05-22 DIAGNOSIS — F1120 Opioid dependence, uncomplicated: Secondary | ICD-10-CM | POA: Diagnosis not present

## 2022-05-22 DIAGNOSIS — E119 Type 2 diabetes mellitus without complications: Secondary | ICD-10-CM | POA: Diagnosis not present

## 2022-05-22 DIAGNOSIS — M5416 Radiculopathy, lumbar region: Secondary | ICD-10-CM | POA: Diagnosis not present

## 2022-05-22 DIAGNOSIS — Z79899 Other long term (current) drug therapy: Secondary | ICD-10-CM | POA: Diagnosis not present

## 2022-05-22 DIAGNOSIS — Z720 Tobacco use: Secondary | ICD-10-CM | POA: Diagnosis not present

## 2022-05-22 DIAGNOSIS — G8929 Other chronic pain: Secondary | ICD-10-CM | POA: Diagnosis not present

## 2022-05-22 DIAGNOSIS — M545 Low back pain, unspecified: Secondary | ICD-10-CM | POA: Diagnosis not present

## 2022-05-22 DIAGNOSIS — G894 Chronic pain syndrome: Secondary | ICD-10-CM | POA: Diagnosis not present

## 2022-05-22 DIAGNOSIS — F209 Schizophrenia, unspecified: Secondary | ICD-10-CM | POA: Diagnosis not present

## 2022-05-22 DIAGNOSIS — M5430 Sciatica, unspecified side: Secondary | ICD-10-CM | POA: Diagnosis not present

## 2022-05-22 DIAGNOSIS — I10 Essential (primary) hypertension: Secondary | ICD-10-CM | POA: Diagnosis not present

## 2022-05-22 DIAGNOSIS — Z716 Tobacco abuse counseling: Secondary | ICD-10-CM | POA: Diagnosis not present

## 2022-05-27 DIAGNOSIS — E1142 Type 2 diabetes mellitus with diabetic polyneuropathy: Secondary | ICD-10-CM

## 2022-05-27 DIAGNOSIS — J449 Chronic obstructive pulmonary disease, unspecified: Secondary | ICD-10-CM

## 2022-05-27 DIAGNOSIS — I4891 Unspecified atrial fibrillation: Secondary | ICD-10-CM | POA: Diagnosis not present

## 2022-05-27 DIAGNOSIS — F17210 Nicotine dependence, cigarettes, uncomplicated: Secondary | ICD-10-CM | POA: Diagnosis not present

## 2022-05-27 DIAGNOSIS — E785 Hyperlipidemia, unspecified: Secondary | ICD-10-CM

## 2022-05-27 DIAGNOSIS — Z794 Long term (current) use of insulin: Secondary | ICD-10-CM | POA: Diagnosis not present

## 2022-06-06 ENCOUNTER — Ambulatory Visit (INDEPENDENT_AMBULATORY_CARE_PROVIDER_SITE_OTHER): Payer: PPO | Admitting: Family Medicine

## 2022-06-06 ENCOUNTER — Ambulatory Visit: Payer: Self-pay | Admitting: *Deleted

## 2022-06-06 ENCOUNTER — Encounter: Payer: Self-pay | Admitting: Family Medicine

## 2022-06-06 VITALS — BP 122/80 | HR 105 | Resp 16 | Ht 71.0 in | Wt 164.0 lb

## 2022-06-06 DIAGNOSIS — Z23 Encounter for immunization: Secondary | ICD-10-CM | POA: Diagnosis not present

## 2022-06-06 DIAGNOSIS — L03119 Cellulitis of unspecified part of limb: Secondary | ICD-10-CM | POA: Diagnosis not present

## 2022-06-06 MED ORDER — DOXYCYCLINE HYCLATE 100 MG PO TABS: 100.0000 mg | ORAL_TABLET | Freq: Two times a day (BID) | ORAL | 0 refills | Status: DC

## 2022-06-06 MED ORDER — LEVOFLOXACIN 500 MG PO TABS: 500.0000 mg | ORAL_TABLET | Freq: Every day | ORAL | 0 refills | Status: AC

## 2022-06-06 NOTE — Progress Notes (Signed)
Name: Mark Nash   MRN: [id number]    DOB: 04/05/1956   Date:06/06/2022       Progress Note  Subjective  Chief Complaint  Leg Swelling  HPI  Mark Nash has a history of cellulitis and polyneuropathy  , two weeks ago he bumped his left lower leg on car door, a few days later he noticed swelling and redness on left lower leg, he tried to elevate it and keep it clean but is not improving. He denies fever or chills. He came in today to be evaluated  Patient Active Problem List   Diagnosis Date Noted   Chronic a-fib ([place]) 03/15/2022   Persistent atrial fibrillation ([place]) 05/07/2021   Type 2 diabetes mellitus with diabetic polyneuropathy, with long-term current use of insulin ([place]) 03/16/2021   DDD (degenerative disc disease), lumbar 03/06/2019   Primary osteoarthritis involving multiple joints 03/06/2019   Lumbar radiculopathy 11/05/2018   Lumbar post-laminectomy syndrome 11/05/2018   Diabetic polyneuropathy associated with type 2 diabetes mellitus ([place]) 11/05/2018   CLE (columnar lined esophagus)    Gastric irritation    Duodenum ulcer    Personal history of colonic polyps    Polyp of sigmoid colon    Diverticulosis of large intestine without diverticulitis    Benign neoplasm of ascending colon    Osteoarthritis of both hands 09/04/2017   Chronic pain of left knee 09/04/2017   Lymphedema 07/07/2016   Chronic hepatitis C without hepatic coma ([place]) 06/01/2016   Lumbar herniated disc 11/24/2015   Thrombocytopenia ([facility]) 11/11/2015   Hepatitis C antibody test positive 07/13/2015   Chronic constipation 07/13/2015   CN (constipation) 07/13/2015   Diabetes mellitus with coincident hypertension ([place]) 03/08/2015   Dyslipidemia 03/08/2015   Paranoid schizophrenia ([place]) 03/08/2015   Restless leg 03/08/2015   Callus of foot 03/08/2015   Claudication ([place]) 03/08/2015   Chronic obstructive pulmonary disease ([place]) 03/08/2015   Corn or callus 03/08/2015   Essential (primary) hypertension  03/08/2015   HLD (hyperlipidemia) 03/08/2015   Peripheral vascular disease ([place]) 03/08/2015   Polyneuropathy 03/08/2015   Current tobacco use 03/08/2015   Vitamin D deficiency 04/26/2009   Avitaminosis D 04/26/2009    Past Surgical History:  Procedure Laterality Date   BACK SURGERY     CARDIOVERSION N/A 05/20/2021   Procedure: CARDIOVERSION;  Surgeon: Wellington Hampshire, MD;  Location: [facility];  Service: Cardiovascular;  Laterality: N/A;   CATARACT EXTRACTION W/PHACO Right 10/31/2021   Procedure: CATARACT EXTRACTION PHACO AND INTRAOCULAR LENS PLACEMENT ([place]) RIGHT DIABETIC;  Surgeon: Eulogio Bear, MD;  Location: [place];  Service: Ophthalmology;  Laterality: Right;  Diabetic 4.34 00:42.8   COLONOSCOPY WITH PROPOFOL N/A 09/07/2017   Procedure: COLONOSCOPY WITH PROPOFOL;  Surgeon: Virgel Manifold, MD;  Location: [facility];  Service: Endoscopy;  Laterality: N/A;   ESOPHAGOGASTRODUODENOSCOPY (EGD) WITH PROPOFOL N/A 12/07/2017   Procedure: ESOPHAGOGASTRODUODENOSCOPY (EGD) WITH PROPOFOL;  Surgeon: Virgel Manifold, MD;  Location: [facility];  Service: Endoscopy;  Laterality: N/A;   FLEXIBLE SIGMOIDOSCOPY N/A 11/09/2017   Procedure: FLEXIBLE SIGMOIDOSCOPY;  Surgeon: Virgel Manifold, MD;  Location: [facility];  Service: Endoscopy;  Laterality: N/A;   HERNIA REPAIR     inguinal   INGUINAL HERNIA REPAIR Bilateral 06/21/2018   Procedure: LAPAROSCOPIC BILATERAL INGUINAL HERNIA REPAIR;  Surgeon: Vickie Epley, MD;  Location: [facility];  Service: General;  Laterality: Bilateral;   L3 TO L5 LAMINECTOMY FOR DECOMPRESSION  07/17/2016   [facility]   REPAIR OF  CEREBROSPINAL FLUID LEAK N/A 09/08/2016   Procedure: Lumbar wound exploration, repair of pseudomenigocele;  Surgeon: Kevan Ny Ditty, MD;  Location: [place];  Service: Neurosurgery;  Laterality: N/A;  Lumbar wound exploration, repair of pseudomenigocele   SINUSOTOMY      Family  History  Problem Relation Age of Onset   Heart failure Mother    Hypertension Mother    Diverticulitis Mother    Colitis Mother    Diabetes Father    Hypertension Father    Heart disease Father    Diabetes Sister    Heart failure Brother    Diverticulitis Brother     Social History   Tobacco Use   Smoking status: Every Day    Packs/day: 1.50    Years: 46.00    Total pack years: 69.00    Types: Cigarettes   Smokeless tobacco: Never  Substance Use Topics   Alcohol use: No    Alcohol/week: 0.0 standard drinks of alcohol     Current Outpatient Medications:    apixaban (ELIQUIS) 5 MG TABS tablet, Take 5 mg by mouth 2 (two) times daily. Patient receives via [name] patient assistance through Dec 2023, Disp: , Rfl:    blood glucose meter kit and supplies KIT, Dispense based on patient and insurance preference. Use up to tid E11.65, LON:99, Disp: 1 each, Rfl: 0   Budeson-Glycopyrrol-Formoterol (BREZTRI AEROSPHERE) 160-9-4.8 MCG/ACT AERO, Inhale 2 puffs into the lungs 2 (two) times daily. Patient receives via [employer] Patient Assistance., Disp: 32.1 g, Rfl: 3   DILT-XR 240 MG 24 hr capsule, Take 1 capsule by mouth once daily, Disp: 90 capsule, Rfl: 0   ergocalciferol (VITAMIN D2) 1.25 MG (50000 UT) capsule, Take 1 capsule (50,000 Units total) by mouth every 14 (fourteen) days., Disp: 6 capsule, Rfl: 3   gabapentin (NEURONTIN) 300 MG capsule, Take 300-600 mg by mouth See admin instructions. Take 1 capsule (300 mg) by mouth in the morning, take 1 capsule (300 mg) by mouth at noon, and 2 capsules (600 mg) by mouth in the evening, Disp: , Rfl:    Lancet Devices MISC, 1 each by Does not apply route 3 (three) times daily as needed. What ins will cover dx:E11.65, LON:99 tid, Disp: 100 each, Rfl: 12   LEVEMIR 100 UNIT/ML injection, INJECT 8 UNITS SUBCUTANEOUSLY AT BEDTIME, Disp: 10 mL, Rfl: 0   pravastatin (PRAVACHOL) 20 MG tablet, Take 1 tablet (20 mg total) by mouth daily with supper.,  Disp: 90 tablet, Rfl: 3   tiZANidine (ZANAFLEX) 2 MG tablet, Take 2 mg by mouth every 12 (twelve) hours., Disp: , Rfl:    traMADol (ULTRAM) 50 MG tablet, Take 50 mg by mouth every 12 (twelve) hours., Disp: , Rfl:   Allergies  Allergen Reactions   Penicillins Hives and Swelling    As a child. Has patient had a PCN reaction causing immediate rash, facial/tongue/throat swelling, SOB or lightheadedness with hypotension: YES  Has patient had a PCN reaction causing severe rash involving mucus membranes or skin necrosis: UNKNOWN Has patient had a PCN reaction that required hospitalization: UNKNOWN Has patient had a PCN reaction occurring within the last 10 years: NO   Aspirin Nausea Only   Ibuprofen Nausea Only   Lyrica [Pregabalin] Nausea Only   Acetaminophen Nausea Only    I personally reviewed active problem list, medication list, allergies, family history, social history, health maintenance with the patient/caregiver today.   ROS  Ten systems reviewed and is negative except as mentioned in HPI  Objective  Vitals:   06/06/22 1134  BP: 122/80  Pulse: (!) 105  Resp: 16  SpO2: 95%  Weight: 164 lb (74.4 kg)  Height: _0  (1.803 m)    Body mass index is 22.87 kg/m.  Physical Exam  Constitutional: Patient appears well-developed and well-nourished. No distress.  HEENT: head atraumatic, normocephalic, pupils equal and reactive to light,, neck supple Cardiovascular: Normal rate, regular rhythm and normal heart sounds.  No murmur heard. Venous stasis right lower leg, left lower leg swollen, calf is non tender but slightly tight, anterior left lower leg is red and inflamed and has three superficial lesions. Advised him to stop using bandaid, we wrapped with non adhesive gauze and wrapped with  [place] Pulmonary/Chest: Effort normal and breath sounds normal. No respiratory distress. Abdominal: Soft.  There is no tenderness. Psychiatric: Patient has a normal mood and affect. behavior  is normal. Judgment and thought content normal.   Recent Results (from the past 2160 hour(s))  POCT HgB A1C     Status: Abnormal   Collection Time: 03/15/22  2:29 PM  Result Value Ref Range   Hemoglobin A1C 7.7 (A) 4.0 - 5.6 %   HbA1c POC (<> result, manual entry)     HbA1c, POC (prediabetic range)     HbA1c, POC (controlled diabetic range)      PHQ2/9:    06/06/2022   11:33 AM 03/15/2022    2:15 PM 11/11/2021    2:03 PM 09/19/2021    1:38 PM 08/18/2021    1:37 PM  Depression screen PHQ 2/9  Decreased Interest 0 0 0 0 0  Down, Depressed, Hopeless 0 0 0 0 0  PHQ - 2 Score 0 0 0 0 0  Altered sleeping 0  0 0 0  Tired, decreased energy 0  0 0 0  Change in appetite 0  0 0 0  Feeling bad or failure about yourself  0  0 0 0  Trouble concentrating 0  0 0 0  Moving slowly or fidgety/restless 0  0 0 0  Suicidal thoughts 0  0 0 0  PHQ-9 Score 0  0 0 0  Difficult doing work/chores   Not difficult at all Not difficult at all Not difficult at all    phq 9 is negative   Fall Risk:    06/06/2022   11:33 AM 03/15/2022    2:15 PM 11/11/2021    2:03 PM 09/19/2021    1:38 PM 08/18/2021    1:37 PM  Fall Risk   Falls in the past year? 0 0 1 0 0  Number falls in past yr: 0 0 0 0 0  Injury with Fall? 0 0 0 0 0  Risk for fall due to : No Fall Risks No Fall Risks Impaired balance/gait    Follow up Falls prevention discussed Falls prevention discussed Falls prevention discussed        Functional Status Survey: Is the patient deaf or have difficulty hearing?: No Does the patient have difficulty seeing, even when wearing glasses/contacts?: No Does the patient have difficulty concentrating, remembering, or making decisions?: No Does the patient have difficulty walking or climbing stairs?: No Does the patient have difficulty dressing or bathing?: No Does the patient have difficulty doing errands alone such as visiting a doctor's office or shopping?: No    Assessment & Plan  1. Recurrent  cellulitis of lower extremity  - levofloxacin (LEVAQUIN) 500 MG tablet; Take 1 tablet (500 mg total) by mouth  daily for 7 days.  Dispense: 7 tablet; Refill: 0 - doxycycline (VIBRA-TABS) 100 MG tablet; Take 1 tablet (100 mg total) by mouth 2 (two) times daily.  Dispense: 14 tablet; Refill: 0  2. Need for immunization against influenza  - Flu Vaccine QUAD High Dose(Fluad)

## 2022-06-06 NOTE — Telephone Encounter (Signed)
Reason for Disposition  [1] MODERATE leg swelling (e.g., swelling extends up to knees) AND [2] new-onset or worsening  Answer Assessment - Initial Assessment Questions 1. ONSET: "When did the swelling start?" (e.g., minutes, hours, days)     1 week ago 2. LOCATION: "What part of the leg is swollen?"  "Are both legs swollen or just one leg?"     Left leg- ankle to knee 3. SEVERITY: "How bad is the swelling?" (e.g., localized; mild, moderate, severe)   - Localized: Small area of swelling localized to one leg.   - MILD pedal edema: Swelling limited to foot and ankle, pitting edema < 1/4 inch (6 mm) deep, rest and elevation eliminate most or all swelling.   - MODERATE edema: Swelling of lower leg to knee, pitting edema > 1/4 inch (6 mm) deep, rest and elevation only partially reduce swelling.   - SEVERE edema: Swelling extends above knee, facial or hand swelling present.      moderate 4. REDNESS: "Does the swelling look red or infected?"     3 wounds, pink in color 5. PAIN: "Is the swelling painful to touch?" If Yes, ask: "How painful is it?"   (Scale 1-10; mild, moderate or severe)     Wound pain 6. FEVER: "Do you have a fever?" If Yes, ask: "What is it, how was it measured, and when did it start?"      *No Answer* 7. CAUSE: "What do you think is causing the leg swelling?"     unsure 8. MEDICAL HISTORY: "Do you have a history of blood clots (e.g., DVT), cancer, heart failure, kidney disease, or liver failure?"     *No Answer* 9. RECURRENT SYMPTOM: "Have you had leg swelling before?" If Yes, ask: "When was the last time?" "What happened that time?"     2 times 10. OTHER SYMPTOMS: "Do you have any other symptoms?" (e.g., chest pain, difficulty breathing)       Some trouble catching breath- sinus symptoms 3 weeks 11. PREGNANCY: "Is there any chance you are pregnant?" "When was your last menstrual period?"       *No Answer*  Protocols used: Leg Swelling and Edema-A-AH

## 2022-06-06 NOTE — Telephone Encounter (Signed)
  Chief Complaint: left leg swelling- 3 wounds Symptoms: swelling, pink, 3 wounds Frequency: 1 week Pertinent Negatives: Patient denies chest pain, fever Disposition: '[]'$ ED /'[]'$ Urgent Care (no appt availability in office) / '[x]'$ Appointment(In office/virtual)/ '[]'$  [facility]/ '[]'$ Home Care/ '[]'$ Refused Recommended Disposition /'[]'$ [facility]/ '[]'$  Follow-up with PCP Additional Notes:

## 2022-07-11 ENCOUNTER — Other Ambulatory Visit: Payer: Self-pay | Admitting: Family Medicine

## 2022-07-14 ENCOUNTER — Other Ambulatory Visit: Payer: Self-pay | Admitting: Family Medicine

## 2022-07-14 NOTE — Telephone Encounter (Signed)
Pt is calling to follow up on his medication refill. Pt stated he is unsure why we are not filling, and he would really like to get this filled as soon as possible.   Please advise.

## 2022-07-14 NOTE — Progress Notes (Unsigned)
Name: Mark Nash   MRN: [id number]    DOB: June 29, 1956   Date:07/17/2022       Progress Note  Subjective  Chief Complaint  Follow Up  HPI  Lumbar radiculitis: he had back surgery in 06/2016 and 08/2016. He is currently doing okay. Currently seeing  Dr. Humphrey Rolls and is taking , Tramadol, Tizanidine and higher dose gabapentin 300 mg morning, lunch and 2 at night, back pain is well controlled at this time, 5/10   DMII: he is on Levemir 8 units, denies hypoglycemia, but still not checking glucose, his A1C had gone up to 8.8 % but today is down to 7.6%  . He is on ACE , statins and gabapentin for neuropathy.  He also has PVD. He tried [employer] but bothered him and he stopped using it He denies polyphagia,he has dry mouth and drinks water throughout the day but does not feel thirsty per se  . He is trying to eat healthier, I am afraid to go up on dose of insulin since he is not checking glucose at home   PAD: he had multiple episodes of cellulitis in 2022 and surgeon decided to hold off on surgical procedure , he is on medical management. He continues to have claudication but is better, able to walk for 20-30 minutes . He recently saw vascular surgeon . He is using compression stocking prn, currently doing well and only using diabetic socks.    COPD: he always has daily morning  productive cough. He has some SOB with activity but doing better since taking maintenance medication,- Brezti  . He can tell when he skips medication. He is still smoking 1.5 - 2 packs daily, he has not been able to stop smoking    Hyperlipidemia: taking Pravastatin 20 mg daily and denies myalgias. Last LDL was 43, continue medication   HTN: taking Diltiazem for afib and bp, he has intermittent chest pain but not as frequent and not as intense . Discussed when to call 911, specifically if lasts more than 10 minutes, associated with N/V/diaphoresis or radiation to jaw, back or arms. No palpitation, he has SOB that is  multifactorial and present with activity    Schizophrenia/paranoid type : denies any hallucinations, no depressed mood or suicidal thoughts or ideation, not on medication and refuses to see Psychiatrist. He states he helps his family.  Mother died in February 23, 2023  and brother died 15-Dec-2021, he still has one sister. He states his mood is okay    Hepatitis C antibody positive: he has a remote history of drug use from age 68 till 24. Used injectable drug use at the time, he has positive hepatitis C , he went to see GI but did not discuss hepatitis C. He has seen GI, he has a large polyp removed 2019 and was supposed to go back for repeat colonoscopy but he keeps postponing , he said he is willing to go back after the new year, we will reach out to [place]    Chronic Constipation: he states bowel movements have improvement   He had EGD and colonoscopy in 2019 , past due for repeat testing secondary to large sigmoid polyp,he had covid, followed by taking care of mother, brother and heart ablation, advised him to go back to have colonoscopy now - he states he has not scheduled due to problems with transportation. I sent referral to [place] , he still needs transportation to have it done I will reach out to [employer]  again    Senile purpura: both arms. on both upper extremity, he is on Xarelto . Reassurance given   Afib: found on auscultation during office visit , CHADVasc score of 4, he is under the care of cardiologist  , rate controlled with Diltiazem and takes Xarelto , no recent nose bleeds.   Patient Active Problem List   Diagnosis Date Noted   Chronic a-fib ([place]) 03/15/2022   Persistent atrial fibrillation ([place]) 05/07/2021   Type 2 diabetes mellitus with diabetic polyneuropathy, with long-term current use of insulin ([place]) 03/16/2021   DDD (degenerative disc disease), lumbar 03/06/2019   Primary osteoarthritis involving multiple joints 03/06/2019   Lumbar radiculopathy 11/05/2018   Lumbar  post-laminectomy syndrome 11/05/2018   Diabetic polyneuropathy associated with type 2 diabetes mellitus ([place]) 11/05/2018   CLE (columnar lined esophagus)    Gastric irritation    Duodenum ulcer    Personal history of colonic polyps    Polyp of sigmoid colon    Diverticulosis of large intestine without diverticulitis    Benign neoplasm of ascending colon    Osteoarthritis of both hands 09/04/2017   Chronic pain of left knee 09/04/2017   Lymphedema 07/07/2016   Chronic hepatitis C without hepatic coma ([place]) 06/01/2016   Lumbar herniated disc 11/24/2015   Thrombocytopenia (HCC) 11/11/2015   Hepatitis C antibody test positive 07/13/2015   Chronic constipation 07/13/2015   CN (constipation) 07/13/2015   Diabetes mellitus with coincident hypertension ([place]) 03/08/2015   Dyslipidemia 03/08/2015   Paranoid schizophrenia ([place]) 03/08/2015   Restless leg 03/08/2015   Callus of foot 03/08/2015   Claudication ([place]) 03/08/2015   Chronic obstructive pulmonary disease ([place]) 03/08/2015   Corn or callus 03/08/2015   Essential (primary) hypertension 03/08/2015   HLD (hyperlipidemia) 03/08/2015   Peripheral vascular disease ([place]) 03/08/2015   Polyneuropathy 03/08/2015   Current tobacco use 03/08/2015   Vitamin D deficiency 04/26/2009   Avitaminosis D 04/26/2009    Past Surgical History:  Procedure Laterality Date   BACK SURGERY     CARDIOVERSION N/A 05/20/2021   Procedure: CARDIOVERSION;  Surgeon: Wellington Hampshire, MD;  Location: [facility];  Service: Cardiovascular;  Laterality: N/A;   CATARACT EXTRACTION W/PHACO Right 10/31/2021   Procedure: CATARACT EXTRACTION PHACO AND INTRAOCULAR LENS PLACEMENT ([place]) RIGHT DIABETIC;  Surgeon: Eulogio Bear, MD;  Location: [facility];  Service: Ophthalmology;  Laterality: Right;  Diabetic 4.34 00:42.8   COLONOSCOPY WITH PROPOFOL N/A 09/07/2017   Procedure: COLONOSCOPY WITH PROPOFOL;  Surgeon: Virgel Manifold, MD;  Location: [facility];   Service: Endoscopy;  Laterality: N/A;   ESOPHAGOGASTRODUODENOSCOPY (EGD) WITH PROPOFOL N/A 12/07/2017   Procedure: ESOPHAGOGASTRODUODENOSCOPY (EGD) WITH PROPOFOL;  Surgeon: Virgel Manifold, MD;  Location: [facility];  Service: Endoscopy;  Laterality: N/A;   FLEXIBLE SIGMOIDOSCOPY N/A 11/09/2017   Procedure: FLEXIBLE SIGMOIDOSCOPY;  Surgeon: Virgel Manifold, MD;  Location: [facility];  Service: Endoscopy;  Laterality: N/A;   HERNIA REPAIR     inguinal   INGUINAL HERNIA REPAIR Bilateral 06/21/2018   Procedure: LAPAROSCOPIC BILATERAL INGUINAL HERNIA REPAIR;  Surgeon: Vickie Epley, MD;  Location: [facility];  Service: General;  Laterality: Bilateral;   L3 TO L5 LAMINECTOMY FOR DECOMPRESSION  07/17/2016   [facility]   REPAIR OF CEREBROSPINAL FLUID LEAK N/A 09/08/2016   Procedure: Lumbar wound exploration, repair of pseudomenigocele;  Surgeon: Kevan Ny Ditty, MD;  Location: [facility];  Service: Neurosurgery;  Laterality: N/A;  Lumbar wound exploration, repair of pseudomenigocele   SINUSOTOMY  Family History  Problem Relation Age of Onset   Heart failure Mother    Hypertension Mother    Diverticulitis Mother    Colitis Mother    Diabetes Father    Hypertension Father    Heart disease Father    Diabetes Sister    Heart failure Brother    Diverticulitis Brother     Social History   Tobacco Use   Smoking status: Every Day    Packs/day: 1.50    Years: 46.00    Total pack years: 69.00    Types: Cigarettes   Smokeless tobacco: Never  Substance Use Topics   Alcohol use: No    Alcohol/week: 0.0 standard drinks of alcohol     Current Outpatient Medications:    apixaban (ELIQUIS) 5 MG TABS tablet, Take 5 mg by mouth 2 (two) times daily. Patient receives via [name] patient assistance through Dec 2023, Disp: , Rfl:    blood glucose meter kit and supplies KIT, Dispense based on patient and insurance preference. Use up to tid E11.65,  LON:99, Disp: 1 each, Rfl: 0   Budeson-Glycopyrrol-Formoterol (BREZTRI AEROSPHERE) 160-9-4.8 MCG/ACT AERO, Inhale 2 puffs into the lungs 2 (two) times daily. Patient receives via [employer] Patient Assistance., Disp: 32.1 g, Rfl: 3   DILT-XR 240 MG 24 hr capsule, Take 1 capsule by mouth once daily, Disp: 90 capsule, Rfl: 0   gabapentin (NEURONTIN) 300 MG capsule, Take 300-600 mg by mouth See admin instructions. Take 1 capsule (300 mg) by mouth in the morning, take 1 capsule (300 mg) by mouth at noon, and 2 capsules (600 mg) by mouth in the evening, Disp: , Rfl:    Lancet Devices MISC, 1 each by Does not apply route 3 (three) times daily as needed. What ins will cover dx:E11.65, LON:99 tid, Disp: 100 each, Rfl: 12   LEVEMIR 100 UNIT/ML injection, INJECT 8 UNITS SUBCUTANEOUSLY AT BEDTIME, Disp: 10 mL, Rfl: 0   pravastatin (PRAVACHOL) 20 MG tablet, Take 1 tablet (20 mg total) by mouth daily with supper., Disp: 90 tablet, Rfl: 3   tiZANidine (ZANAFLEX) 4 MG tablet, Take 4 mg by mouth every 12 (twelve) hours., Disp: , Rfl:    traMADol (ULTRAM) 50 MG tablet, Take 50 mg by mouth every 12 (twelve) hours., Disp: , Rfl:    Vitamin D, Ergocalciferol, (DRISDOL) 1.25 MG (50000 UNIT) CAPS capsule, TAKE 1 CAPSULE BY MOUTH EVERY TWO WEEKS, Disp: 12 capsule, Rfl: 0  Allergies  Allergen Reactions   Penicillins Hives and Swelling    As a child. Has patient had a PCN reaction causing immediate rash, facial/tongue/throat swelling, SOB or lightheadedness with hypotension: YES  Has patient had a PCN reaction causing severe rash involving mucus membranes or skin necrosis: UNKNOWN Has patient had a PCN reaction that required hospitalization: UNKNOWN Has patient had a PCN reaction occurring within the last 10 years: NO   Aspirin Nausea Only   Ibuprofen Nausea Only   Lyrica [Pregabalin] Nausea Only   Acetaminophen Nausea Only    I personally reviewed active problem list, medication list, allergies, family history, social  history, health maintenance with the patient/caregiver today.   ROS  Ten systems reviewed and is negative except as mentioned in HPI   Objective  Vitals:   07/17/22 1329  BP: 134/76  Pulse: 88  Resp: 16  SpO2: 95%  Weight: 161 lb (73 kg)  Height: _0  (1.803 m)    Body mass index is 22.45 kg/m.  Physical Exam  Constitutional:  Patient appears well-developed and well-nourished.  No distress.  HEENT: head atraumatic, normocephalic, pupils equal and reactive to light, neck supple Cardiovascular: Normal rate, regular rhythm and normal heart sounds.  No murmur heard. No BLE edema. Pulmonary/Chest: Effort normal and breath sounds normal. No respiratory distress. Abdominal: Soft.  There is no tenderness. Skin: senile purpura on arms.  Psychiatric: Patient has a normal mood and affect. behavior is normal. Judgment and thought content normal.   Recent Results (from the past 2160 hour(s))  POCT HgB A1C     Status: Abnormal   Collection Time: 07/17/22  1:33 PM  Result Value Ref Range   Hemoglobin A1C 7.6 (A) 4.0 - 5.6 %   HbA1c POC (<> result, manual entry)     HbA1c, POC (prediabetic range)     HbA1c, POC (controlled diabetic range)        PHQ2/9:    07/17/2022    1:31 PM 06/06/2022   11:33 AM 03/15/2022    2:15 PM 11/11/2021    2:03 PM 09/19/2021    1:38 PM  Depression screen PHQ 2/9  Decreased Interest 0 0 0 0 0  Down, Depressed, Hopeless 0 0 0 0 0  PHQ - 2 Score 0 0 0 0 0  Altered sleeping 0 0  0 0  Tired, decreased energy 0 0  0 0  Change in appetite 0 0  0 0  Feeling bad or failure about yourself  0 0  0 0  Trouble concentrating 0 0  0 0  Moving slowly or fidgety/restless 0 0  0 0  Suicidal thoughts 0 0  0 0  PHQ-9 Score 0 0  0 0  Difficult doing work/chores    Not difficult at all Not difficult at all    phq 9 is negative   Fall Risk:    07/17/2022    1:31 PM 06/06/2022   11:33 AM 03/15/2022    2:15 PM 11/11/2021    2:03 PM 09/19/2021    1:38 PM   Fall Risk   Falls in the past year? 0 0 0 1 0  Number falls in past yr: 0 0 0 0 0  Injury with Fall? 0 0 0 0 0  Risk for fall due to : No Fall Risks No Fall Risks No Fall Risks Impaired balance/gait   Follow up Falls prevention discussed Falls prevention discussed Falls prevention discussed Falls prevention discussed       Functional Status Survey: Is the patient deaf or have difficulty hearing?: No Does the patient have difficulty seeing, even when wearing glasses/contacts?: No Does the patient have difficulty concentrating, remembering, or making decisions?: No Does the patient have difficulty walking or climbing stairs?: No Does the patient have difficulty dressing or bathing?: No Does the patient have difficulty doing errands alone such as visiting a doctor's office or shopping?: No    Assessment & Plan   1. Type 2 diabetes mellitus with diabetic polyneuropathy, with long-term current use of insulin (HCC)  - POCT HgB A1C  2. Persistent atrial fibrillation (HCC)  On Xarelto and diltiazem, rate is controlled  3. Paranoid schizophrenia ([place])  Stable   4. Senile purpura ([place])  Reassurance given   5. Current tobacco use   6. COPD, mild ([place])  Doing better on Brezti   7. Chronic hepatitis C without hepatic coma (HCC)  Unchanged   8. Claudication ([place])  - pravastatin (PRAVACHOL) 20 MG tablet; Take 1 tablet (20 mg total) by mouth daily  with supper.  Dispense: 90 tablet; Refill: 3  9. Peripheral vascular disease (HCC)  - pravastatin (PRAVACHOL) 20 MG tablet; Take 1 tablet (20 mg total) by mouth daily with supper.  Dispense: 90 tablet; Refill: 3  10. Vitamin D deficiency  Continue supplementation   11. Essential (primary) hypertension   12. Dyslipidemia  - pravastatin (PRAVACHOL) 20 MG tablet; Take 1 tablet (20 mg total) by mouth daily with supper.  Dispense: 90 tablet; Refill: 3

## 2022-07-14 NOTE — Telephone Encounter (Signed)
Requested medications are due for refill today.  unsure  Requested medications are on the active medications list.  yes  Last refill. 07/13/2021 #6 3 rf  Future visit scheduled.   yes  Notes to clinic.  Review by provider.    Requested Prescriptions  Pending Prescriptions Disp Refills   Vitamin D, Ergocalciferol, (DRISDOL) 1.25 MG (50000 UNIT) CAPS capsule [Pharmacy Med Name: Vitamin D (Ergocalciferol) 1.25 MG (50000 UT) Oral Capsule] 6 capsule 0    Sig: TAKE 1 CAPSULE BY MOUTH EVERY TWO WEEKS     Endocrinology:  Vitamins - Vitamin D Supplementation 2 Failed - 07/14/2022  9:41 AM      Failed - Manual Review: Route requests for 50,000 IU strength to the provider      Failed - Vitamin D in normal range and within 360 days    Vit D, 25-Hydroxy  Date Value Ref Range Status  03/30/2017 26 (L) 30 - 100 ng/mL Final    Comment:    Vitamin D Status           25-OH Vitamin D        Deficiency                <20 ng/mL        Insufficiency         20 - 29 ng/mL        Optimal             > or = 30 ng/mL   For 25-OH Vitamin D testing on patients on D2-supplementation and patients for whom quantitation of D2 and D3 fractions is required, the QuestAssureD 25-OH VIT D, (D2,D3), LC/MS/MS is recommended: order code [id number] (patients > 2 yrs).          Passed - Ca in normal range and within 360 days    Calcium  Date Value Ref Range Status  11/11/2021 9.0 8.6 - 10.3 mg/dL Final         Passed - Valid encounter within last 12 months    Recent Outpatient Visits           1 month ago Recurrent cellulitis of lower extremity   [facility] [place], Drue Stager, MD   4 months ago Type 2 diabetes mellitus with diabetic polyneuropathy, with long-term current use of insulin [facility])   [facility] [place], Drue Stager, MD   8 months ago Type 2 diabetes mellitus with diabetic polyneuropathy, with long-term current use of insulin [facility])   [facility]  [place], Drue Stager, MD   9 months ago Type 2 diabetes mellitus with diabetic polyneuropathy, with long-term current use of insulin [facility])   [facility] Teodora Medici, DO   11 months ago Left leg swelling   [facility] Teodora Medici, DO       Future Appointments             In 3 days [name], MD [facility], [place]   In 1 month Gerrie Nordmann, NP [place]. [place]

## 2022-07-17 ENCOUNTER — Ambulatory Visit (INDEPENDENT_AMBULATORY_CARE_PROVIDER_SITE_OTHER): Payer: PPO | Admitting: Family Medicine

## 2022-07-17 ENCOUNTER — Telehealth: Payer: Self-pay | Admitting: *Deleted

## 2022-07-17 ENCOUNTER — Encounter: Payer: Self-pay | Admitting: Family Medicine

## 2022-07-17 VITALS — BP 134/76 | HR 88 | Resp 16 | Ht 71.0 in | Wt 161.0 lb

## 2022-07-17 DIAGNOSIS — J449 Chronic obstructive pulmonary disease, unspecified: Secondary | ICD-10-CM

## 2022-07-17 DIAGNOSIS — E785 Hyperlipidemia, unspecified: Secondary | ICD-10-CM | POA: Diagnosis not present

## 2022-07-17 DIAGNOSIS — Z720 Tobacco use: Secondary | ICD-10-CM

## 2022-07-17 DIAGNOSIS — D692 Other nonthrombocytopenic purpura: Secondary | ICD-10-CM | POA: Diagnosis not present

## 2022-07-17 DIAGNOSIS — F200 Paranoid schizophrenia: Secondary | ICD-10-CM

## 2022-07-17 DIAGNOSIS — I739 Peripheral vascular disease, unspecified: Secondary | ICD-10-CM | POA: Diagnosis not present

## 2022-07-17 DIAGNOSIS — Z794 Long term (current) use of insulin: Secondary | ICD-10-CM | POA: Diagnosis not present

## 2022-07-17 DIAGNOSIS — E559 Vitamin D deficiency, unspecified: Secondary | ICD-10-CM

## 2022-07-17 DIAGNOSIS — B182 Chronic viral hepatitis C: Secondary | ICD-10-CM | POA: Diagnosis not present

## 2022-07-17 DIAGNOSIS — I10 Essential (primary) hypertension: Secondary | ICD-10-CM | POA: Diagnosis not present

## 2022-07-17 DIAGNOSIS — E1142 Type 2 diabetes mellitus with diabetic polyneuropathy: Secondary | ICD-10-CM

## 2022-07-17 DIAGNOSIS — I4819 Other persistent atrial fibrillation: Secondary | ICD-10-CM

## 2022-07-17 LAB — POCT GLYCOSYLATED HEMOGLOBIN (HGB A1C): Hemoglobin A1C: 7.6 % — AB (ref 4.0–5.6)

## 2022-07-17 MED ORDER — PRAVASTATIN SODIUM 20 MG PO TABS: 20.0000 mg | ORAL_TABLET | Freq: Every day | ORAL | 3 refills | Status: DC

## 2022-07-17 NOTE — Patient Outreach (Signed)
  Care Coordination   07/17/2022 Name: Mark Nash MRN: [id number] DOB: 1955-09-23   Care Coordination Outreach Attempts:  An unsuccessful telephone outreach was attempted today to offer the patient information about available care coordination services as a benefit of their health plan.   Follow Up Plan:  Additional outreach attempts will be made to offer the patient care coordination information and services.   Encounter Outcome:  No Answer  Care Coordination Interventions Activated:  No   Care Coordination Interventions:  No, not indicated    Jaydon Soroka, [place] Worker  [facility] Care Management [phone]

## 2022-08-02 DIAGNOSIS — Z720 Tobacco use: Secondary | ICD-10-CM | POA: Diagnosis not present

## 2022-08-02 DIAGNOSIS — F1120 Opioid dependence, uncomplicated: Secondary | ICD-10-CM | POA: Diagnosis not present

## 2022-08-02 DIAGNOSIS — Z716 Tobacco abuse counseling: Secondary | ICD-10-CM | POA: Diagnosis not present

## 2022-08-02 DIAGNOSIS — I10 Essential (primary) hypertension: Secondary | ICD-10-CM | POA: Diagnosis not present

## 2022-08-02 DIAGNOSIS — Z79899 Other long term (current) drug therapy: Secondary | ICD-10-CM | POA: Diagnosis not present

## 2022-08-02 DIAGNOSIS — E119 Type 2 diabetes mellitus without complications: Secondary | ICD-10-CM | POA: Diagnosis not present

## 2022-08-02 DIAGNOSIS — B351 Tinea unguium: Secondary | ICD-10-CM | POA: Diagnosis not present

## 2022-08-02 DIAGNOSIS — L851 Acquired keratosis [keratoderma] palmaris et plantaris: Secondary | ICD-10-CM | POA: Diagnosis not present

## 2022-08-02 DIAGNOSIS — M961 Postlaminectomy syndrome, not elsewhere classified: Secondary | ICD-10-CM | POA: Diagnosis not present

## 2022-08-02 DIAGNOSIS — F209 Schizophrenia, unspecified: Secondary | ICD-10-CM | POA: Diagnosis not present

## 2022-08-02 DIAGNOSIS — G894 Chronic pain syndrome: Secondary | ICD-10-CM | POA: Diagnosis not present

## 2022-08-02 DIAGNOSIS — M5416 Radiculopathy, lumbar region: Secondary | ICD-10-CM | POA: Diagnosis not present

## 2022-08-02 DIAGNOSIS — E1142 Type 2 diabetes mellitus with diabetic polyneuropathy: Secondary | ICD-10-CM | POA: Diagnosis not present

## 2022-08-02 DIAGNOSIS — G8929 Other chronic pain: Secondary | ICD-10-CM | POA: Diagnosis not present

## 2022-08-02 DIAGNOSIS — M545 Low back pain, unspecified: Secondary | ICD-10-CM | POA: Diagnosis not present

## 2022-08-02 DIAGNOSIS — M5430 Sciatica, unspecified side: Secondary | ICD-10-CM | POA: Diagnosis not present

## 2022-08-07 ENCOUNTER — Other Ambulatory Visit: Payer: Self-pay | Admitting: Internal Medicine

## 2022-08-07 NOTE — Telephone Encounter (Signed)
Please schedule overdue F/U appointment with primary cardiologist. Thank you!

## 2022-08-22 NOTE — Progress Notes (Unsigned)
Cardiology Clinic Note   Patient Name: Mark Nash Date of Encounter: 08/23/2022  Primary Care Provider:  [name], MD Primary Cardiologist:  Ida Rogue, MD  Patient Profile    66 year old male with a past medical history of hypertensin, type II diabetes, PAD, COPD, schizophrenia/schizoaffective disorder, tobacco use and persistent atrial fibrillation on chronic anticoagulation with xarelto, who is here today for follow up  Past Medical History    Past Medical History:  Diagnosis Date   Arthritis    Asthma    Benign neoplasm of ascending colon    polyps   Constipation    COPD (chronic obstructive pulmonary disease) ([place])    NO INHALER   Diabetes mellitus without complication ([place])    Type 2   Diverticulosis    Dyspnea    RARE-WITH EXERTION DUE TO COPD   GERD (gastroesophageal reflux disease)    Hepatitis C    History of kidney stones    H/O   Hyperlipidemia    Hypertension 06/10/2018   currently not under control   Left inguinal hernia    Peripheral vascular disease ([place])    Restless legs    Schizoaffective disorder ([place])    Pt denies   Thrombocytopenia ([place])    Vitamin D deficiency    Wears dentures    full upper and lower   Past Surgical History:  Procedure Laterality Date   BACK SURGERY     CARDIOVERSION N/A 05/20/2021   Procedure: CARDIOVERSION;  Surgeon: Wellington Hampshire, MD;  Location: [facility];  Service: Cardiovascular;  Laterality: N/A;   CATARACT EXTRACTION W/PHACO Right 10/31/2021   Procedure: CATARACT EXTRACTION PHACO AND INTRAOCULAR LENS PLACEMENT ([place]) RIGHT DIABETIC;  Surgeon: Eulogio Bear, MD;  Location: [place];  Service: Ophthalmology;  Laterality: Right;  Diabetic 4.34 00:42.8   COLONOSCOPY WITH PROPOFOL N/A 09/07/2017   Procedure: COLONOSCOPY WITH PROPOFOL;  Surgeon: Virgel Manifold, MD;  Location: [facility];  Service: Endoscopy;  Laterality: N/A;   ESOPHAGOGASTRODUODENOSCOPY (EGD) WITH PROPOFOL  N/A 12/07/2017   Procedure: ESOPHAGOGASTRODUODENOSCOPY (EGD) WITH PROPOFOL;  Surgeon: Virgel Manifold, MD;  Location: [facility];  Service: Endoscopy;  Laterality: N/A;   FLEXIBLE SIGMOIDOSCOPY N/A 11/09/2017   Procedure: FLEXIBLE SIGMOIDOSCOPY;  Surgeon: Virgel Manifold, MD;  Location: [facility];  Service: Endoscopy;  Laterality: N/A;   HERNIA REPAIR     inguinal   INGUINAL HERNIA REPAIR Bilateral 06/21/2018   Procedure: LAPAROSCOPIC BILATERAL INGUINAL HERNIA REPAIR;  Surgeon: Vickie Epley, MD;  Location: [facility];  Service: General;  Laterality: Bilateral;   L3 TO L5 LAMINECTOMY FOR DECOMPRESSION  07/17/2016   [facility]   REPAIR OF CEREBROSPINAL FLUID LEAK N/A 09/08/2016   Procedure: Lumbar wound exploration, repair of pseudomenigocele;  Surgeon: Kevan Ny Ditty, MD;  Location: [place];  Service: Neurosurgery;  Laterality: N/A;  Lumbar wound exploration, repair of pseudomenigocele   SINUSOTOMY      Allergies  Allergies  Allergen Reactions   Penicillins Hives and Swelling    As a child. Has patient had a PCN reaction causing immediate rash, facial/tongue/throat swelling, SOB or lightheadedness with hypotension: YES  Has patient had a PCN reaction causing severe rash involving mucus membranes or skin necrosis: UNKNOWN Has patient had a PCN reaction that required hospitalization: UNKNOWN Has patient had a PCN reaction occurring within the last 10 years: NO   Aspirin Nausea Only   Ibuprofen Nausea Only   Lyrica [Pregabalin] Nausea Only  Acetaminophen Nausea Only    History of Present Illness    Mark Nash is a 66 year old male with the previously mentioned past medical history of hypertension, type 2 diabetes, PAD, COPD, schizophrenia/schizoaffective disorder, tobacco use and persistent atrial fibrillation who was previously diagnosed in July 2022 on chronic anticoagulation with Xarelto, who also had previous COVID-19 infection of  late August 2022.  Last echocardiogram was completed 06/2021 which revealed LVEF of 55-60%, no regional wall motion abnormalities, left atrial size is mildly dilated, mild to moderate aortic valve sclerosis without stenosis and dilatation of the ascending aorta measuring 37 mm and dilatation of the aortic root measuring 36 mm.  He was last seen in clinic 02/15/22 by Dr. Quentin Ore.  At his follow-up appointment in June he had continued to work on treatment of his cellulitis that had improved.  Had remained on Xarelto for stroke prophylaxis.  His rate was controlled with diltiazem.  At that time he was considered not to be a candidate for catheter ablation and he had no symptoms and had a controlled ventricular rate.  There were no changes to his medication or further testing that was ordered at that time and he was recommended for follow-up in 1 year.  He returns to clinic today stating that he has noticed over the last few weeks that he has been experiencing an increased amount of dyspnea on exertion, primarily walking upstairs. His inhaler was changed and he thinks that is helping some. He continues to smoke 1 to 1.5 ppd of cigarettes. He denies any chest pain, palpitations, peripheral edema, or claudication. He has continued to follow with his PCP and [facility]. He denies any recent hospitalizations or visits to the emergency department.   Home Medications    Current Outpatient Medications  Medication Sig Dispense Refill   apixaban (ELIQUIS) 5 MG TABS tablet Take 5 mg by mouth 2 (two) times daily. Patient receives via [name] patient assistance through Dec 2023     blood glucose meter kit and supplies KIT Dispense based on patient and insurance preference. Use up to tid E11.65, LON:99 1 each 0   Budeson-Glycopyrrol-Formoterol (BREZTRI AEROSPHERE) 160-9-4.8 MCG/ACT AERO Inhale 2 puffs into the lungs 2 (two) times daily. Patient receives via [employer] Patient Assistance. 32.1 g 3   gabapentin (NEURONTIN) 300  MG capsule Take 300-600 mg by mouth See admin instructions. Take 1 capsule (300 mg) by mouth in the morning, take 1 capsule (300 mg) by mouth at noon, and 2 capsules (600 mg) by mouth in the evening     Lancet Devices MISC 1 each by Does not apply route 3 (three) times daily as needed. What ins will cover dx:E11.65, LON:99 tid 100 each 12   LEVEMIR 100 UNIT/ML injection INJECT 8 UNITS SUBCUTANEOUSLY AT BEDTIME 10 mL 0   pravastatin (PRAVACHOL) 20 MG tablet Take 1 tablet (20 mg total) by mouth daily with supper. 90 tablet 3   tiZANidine (ZANAFLEX) 4 MG tablet Take 4 mg by mouth every 12 (twelve) hours.     traMADol (ULTRAM) 50 MG tablet Take 50 mg by mouth every 12 (twelve) hours.     Vitamin D, Ergocalciferol, (DRISDOL) 1.25 MG (50000 UNIT) CAPS capsule TAKE 1 CAPSULE BY MOUTH EVERY TWO WEEKS 12 capsule 0   diltiazem (DILT-XR) 240 MG 24 hr capsule Take 1 capsule (240 mg total) by mouth daily. 90 capsule 1   No current facility-administered medications for this visit.     Family History  Family History  Problem Relation Age of Onset   Heart failure Mother    Hypertension Mother    Diverticulitis Mother    Colitis Mother    Diabetes Father    Hypertension Father    Heart disease Father    Diabetes Sister    Heart failure Brother    Diverticulitis Brother    He indicated that his mother is deceased. He indicated that his father is deceased. He indicated that his sister is alive. He indicated that his brother is deceased.  Social History    Social History   Socioeconomic History   Marital status: Divorced    Spouse name: Not on file   Number of children: 2   Years of education: Not on file   Highest education level: 12th grade  Occupational History   Occupation: Disabled  Tobacco Use   Smoking status: Every Day    Packs/day: 1.50    Years: 46.00    Total pack years: 69.00    Types: Cigarettes   Smokeless tobacco: Never  Vaping Use   Vaping Use: Never used  Substance  and Sexual Activity   Alcohol use: No    Alcohol/week: 0.0 standard drinks of alcohol   Drug use: No   Sexual activity: Not Currently  Other Topics Concern   Not on file  Social History Narrative   Lives alone   Social Determinants of Health   Financial Resource Strain: High Risk (10/25/2021)   Overall Financial Resource Strain (CARDIA)    Difficulty of Paying Living Expenses: Hard  Food Insecurity: No Food Insecurity (12/02/2020)   Hunger Vital Sign    Worried About Running Out of Food in the Last Year: Never true    Ran Out of Food in the Last Year: Never true  Transportation Needs: No Transportation Needs (03/20/2022)   PRAPARE - Hydrologist (Medical): No    Lack of Transportation (Non-Medical): No  Physical Activity: Insufficiently Active (12/02/2020)   Exercise Vital Sign    Days of Exercise per Week: 2 days    Minutes of Exercise per Session: 20 min  Stress: No Stress Concern Present (12/02/2020)   [place]    Feeling of Stress : Not at all  Social Connections: Socially Isolated (12/02/2020)   Social Connection and Isolation Panel [NHANES]    Frequency of Communication with Friends and Family: More than three times a week    Frequency of Social Gatherings with Friends and Family: Three times a week    Attends Religious Services: Never    Active Member of Clubs or Organizations: No    Attends Archivist Meetings: Never    Marital Status: Divorced  Human resources officer Violence: Not At Risk (12/02/2020)   Humiliation, Afraid, Rape, and Kick questionnaire    Fear of Current or Ex-Partner: No    Emotionally Abused: No    Physically Abused: No    Sexually Abused: No     Review of Systems    General:  No chills, fever, night sweats or weight changes. Endorses fatigue Cardiovascular:  No chest pain, endorses dyspnea on exertion, edema, orthopnea, palpitations, paroxysmal  nocturnal dyspnea. Dermatological: No rash, lesions/masses Respiratory: No cough, endorses dyspnea Urologic: No hematuria, dysuria Abdominal:   No nausea, vomiting, diarrhea, bright red blood per rectum, melena, or hematemesis Neurologic:  No visual changes, wkns, changes in mental status. All other systems reviewed and are otherwise negative except as  noted above.   Physical Exam    VS:  BP (!) 140/80 (BP Location: Left Arm, Patient Position: Sitting, Cuff Size: Normal)   Pulse 70   Ht _0  (1.803 m)   Wt 161 lb 3.2 oz (73.1 kg)   SpO2 97%   BMI 22.48 kg/m  , BMI Body mass index is 22.48 kg/m.     GEN: Well nourished, well developed, in no acute distress. HEENT: normal. Neck: Supple, no JVD, carotid bruits, or masses. Cardiac: irregularly irregular, no murmurs, rubs, or gallops. No clubbing, cyanosis, edema.  Radials 2+/PT 2+ and equal bilaterally.  Respiratory:  Respirations regular and unlabored, clear to auscultation bilaterally. GI: Soft, nontender, nondistended, BS + x 4. MS: no deformity or atrophy. Skin: warm and dry, no rash. Neuro:  Strength and sensation are intact. Psych: Normal affect.  Accessory Clinical Findings    ECG personally reviewed by me today- atrial flutter with a variable AV block rate of 70 - No acute changes  Lab Results  Component Value Date   WBC 6.7 08/18/2021   HGB 14.9 08/18/2021   HCT 43.9 08/18/2021   MCV 92.6 08/18/2021   PLT 184 08/18/2021   Lab Results  Component Value Date   CREATININE 0.85 11/11/2021   BUN 11 11/11/2021   NA 140 11/11/2021   K 3.7 11/11/2021   CL 103 11/11/2021   CO2 29 11/11/2021   Lab Results  Component Value Date   ALT 10 11/11/2021   AST 10 11/11/2021   ALKPHOS 104 03/30/2017   BILITOT 0.8 11/11/2021   Lab Results  Component Value Date   CHOL 106 11/11/2021   HDL 50 11/11/2021   LDLCALC 43 11/11/2021   TRIG 46 11/11/2021   CHOLHDL 2.1 11/11/2021    Lab Results  Component Value Date    HGBA1C 7.6 (A) 07/17/2022    Assessment & Plan   1.  Persistent atrial fibrillation and atrial flutter. Atrial flutter with variable AV block on EKG today.  He denies any palpitations.  He is continued on Eliquis 5 mg twice daily for stroke prophylaxis for CHA2DS2-VASc score of at least 4.  He is also continued on diltiazem 240 mg daily which he requests refill today to his pharmacy of choice.  He does remain rate controlled so no changes to his dose has been made today.  He is being sent for a BMP today to evaluate electrolytes and kidney function. He also request patient assistance we have ordered for patient as soon as he is on Eliquis as he runs out at the end of the month.  2.  Dyspnea on exertion that he states it had been worsening.  He has continued to smoke 1-1/2 packs of cigarettes per day.  But also states that his DOE has slightly improved with the change in his inhaler.  Will schedule him for an echocardiogram to evaluate function.  3.  Hypertension blood pressure today 146/76 on repeat is 140/80.  He is continued on diltiazem 240 mg daily.  No changes were made to his medication regimen today as he notes dietary indiscretions over the holiday and today as well as an increased amount of stress.  Will monitor on his return and have antihypertensives added as needed.   4.  Hyperlipidemia with last LDL of 43 on 11/11/2020.  He is continued on pravastatin 20 mg daily.  This continues to be followed by primary.  5.  Type 2 diabetes mellitus hemoglobin A1c of 7.6.  He  is continued on Levemir.  This continues to be followed by his PCP.  6.  Disposition patient return to clinic to see MD/APP in 3 months or sooner if needed after echocardiogram is completed.    CHA2DS2-VASc Score = 4   This indicates a 4.8% annual risk of stroke. The patient's score is based upon: CHF History: 0 HTN History: 1 Diabetes History: 1 Stroke History: 0 Vascular Disease History: 1 Age Score: 1 Gender Score:  0     Nguyet Mercer, NP 08/23/2022, 2:29 PM

## 2022-08-23 ENCOUNTER — Encounter: Payer: Self-pay | Admitting: Cardiology

## 2022-08-23 ENCOUNTER — Other Ambulatory Visit
Admission: RE | Admit: 2022-08-23 | Discharge: 2022-08-23 | Disposition: A | Discharge status: Home / Self Care | Payer: PPO | Source: Ambulatory Visit | Attending: Cardiology | Admitting: Cardiology

## 2022-08-23 ENCOUNTER — Ambulatory Visit: Payer: PPO | Attending: Cardiology | Admitting: Cardiology

## 2022-08-23 VITALS — BP 140/80 | HR 70 | Ht 71.0 in | Wt 161.2 lb

## 2022-08-23 DIAGNOSIS — E782 Mixed hyperlipidemia: Secondary | ICD-10-CM | POA: Diagnosis not present

## 2022-08-23 DIAGNOSIS — I4819 Other persistent atrial fibrillation: Secondary | ICD-10-CM | POA: Insufficient documentation

## 2022-08-23 DIAGNOSIS — R0602 Shortness of breath: Secondary | ICD-10-CM | POA: Diagnosis not present

## 2022-08-23 DIAGNOSIS — I10 Essential (primary) hypertension: Secondary | ICD-10-CM

## 2022-08-23 DIAGNOSIS — E1151 Type 2 diabetes mellitus with diabetic peripheral angiopathy without gangrene: Secondary | ICD-10-CM | POA: Diagnosis not present

## 2022-08-23 LAB — BASIC METABOLIC PANEL
Anion gap: 5 (ref 5–15)
BUN: 11 mg/dL (ref 8–23)
CO2: 30 mmol/L (ref 22–32)
Calcium: 8.8 mg/dL — ABNORMAL LOW (ref 8.9–10.3)
Chloride: 105 mmol/L (ref 98–111)
Creatinine, Ser: 0.7 mg/dL (ref 0.61–1.24)
GFR, Estimated: >60 mL/min (ref >60)
Glucose, Bld: 230 mg/dL — ABNORMAL HIGH (ref 70–99)
Potassium: 3.4 mmol/L — ABNORMAL LOW (ref 3.5–5.1)
Sodium: 140 mmol/L (ref 135–145)

## 2022-08-23 MED ORDER — DILTIAZEM HCL ER 240 MG PO CP24: 240.0000 mg | ORAL_CAPSULE | Freq: Every day | ORAL | 1 refills | Status: DC

## 2022-08-23 NOTE — Patient Instructions (Signed)
Medication Instructions:   Your physician recommends that you continue on your current medications as directed. Please refer to the Current Medication list given to you today.  *If you need a refill on your cardiac medications before your next appointment, please call your pharmacy*   Lab Work:  Your physician recommends that you have lab work today @ [facility].  BMP  If you have labs (blood work) drawn today and your tests are completely normal, you will receive your results only by: [place] (if you have MyChart) OR A paper copy in the mail If you have any lab test that is abnormal or we need to change your treatment, we will call you to review the results.   Testing/Procedures:  Your physician has requested that you have an echocardiogram. Echocardiography is a painless test that uses sound waves to create images of your heart. It provides your doctor with information about the size and shape of your heart and how well your heart's chambers and valves are working. This procedure takes approximately one hour. There are no restrictions for this procedure. Please do NOT wear cologne, perfume, aftershave, or lotions (deodorant is allowed). Please arrive 15 minutes prior to your appointment time.    Follow-Up: At [facility], you and your health needs are our priority.  As part of our continuing mission to provide you with exceptional heart care, we have created designated Provider Care Teams.  These Care Teams include your primary Cardiologist (physician) and Advanced Practice Providers (APPs -  Physician Assistants and Nurse Practitioners) who all work together to provide you with the care you need, when you need it.  We recommend signing up for the patient portal called "MyChart".  Sign up information is provided on this After Visit Summary.  MyChart is used to connect with patients for Virtual Visits (Telemedicine).  Patients are able to view lab/test results,  encounter notes, upcoming appointments, etc.  Non-urgent messages can be sent to your provider as well.   To learn more about what you can do with MyChart, go to [email or web address].    Your next appointment:   3 month(s)  The format for your next appointment:   In Person  Provider:   You may see Ida Rogue, MD or one of the following Advanced Practice Providers on your designated Care Team:   Murray Hodgkins, NP Christell Faith, PA-C Cadence Kathlen Mody, PA-C Gerrie Nordmann, NP  Important Information About Sugar

## 2022-08-24 ENCOUNTER — Other Ambulatory Visit: Payer: Self-pay | Admitting: *Deleted

## 2022-08-24 DIAGNOSIS — I4819 Other persistent atrial fibrillation: Secondary | ICD-10-CM

## 2022-08-24 DIAGNOSIS — E876 Hypokalemia: Secondary | ICD-10-CM

## 2022-08-24 MED ORDER — POTASSIUM CHLORIDE ER 10 MEQ PO TBCR: 10.0000 meq | EXTENDED_RELEASE_TABLET | Freq: Every day | ORAL | 3 refills | Status: DC

## 2022-08-24 NOTE — Progress Notes (Signed)
Start 56mq of potassium daily and repeat bmp in one week.

## 2022-09-01 ENCOUNTER — Other Ambulatory Visit
Admission: RE | Admit: 2022-09-01 | Discharge: 2022-09-01 | Disposition: A | Discharge status: Home / Self Care | Payer: PPO | Source: Ambulatory Visit | Attending: Cardiology | Admitting: Cardiology

## 2022-09-01 DIAGNOSIS — E876 Hypokalemia: Secondary | ICD-10-CM | POA: Diagnosis not present

## 2022-09-01 DIAGNOSIS — I4819 Other persistent atrial fibrillation: Secondary | ICD-10-CM | POA: Insufficient documentation

## 2022-09-01 LAB — BASIC METABOLIC PANEL
Anion gap: 5 (ref 5–15)
BUN: 14 mg/dL (ref 8–23)
CO2: 28 mmol/L (ref 22–32)
Calcium: 8.7 mg/dL — ABNORMAL LOW (ref 8.9–10.3)
Chloride: 102 mmol/L (ref 98–111)
Creatinine, Ser: 0.86 mg/dL (ref 0.61–1.24)
GFR, Estimated: >60 mL/min (ref >60)
Glucose, Bld: 280 mg/dL — ABNORMAL HIGH (ref 70–99)
Potassium: 3.8 mmol/L (ref 3.5–5.1)
Sodium: 135 mmol/L (ref 135–145)

## 2022-09-01 NOTE — Progress Notes (Signed)
Potassium has improved. Decrease potassium supplements to three times per week. Will recheck levels on return. No other medication changes at this time.

## 2022-09-04 ENCOUNTER — Other Ambulatory Visit: Payer: Self-pay | Admitting: *Deleted

## 2022-09-04 MED ORDER — POTASSIUM CHLORIDE ER 10 MEQ PO TBCR: 10.0000 meq | EXTENDED_RELEASE_TABLET | ORAL | 0 refills | Status: DC

## 2022-09-05 ENCOUNTER — Ambulatory Visit: Payer: PPO

## 2022-09-11 ENCOUNTER — Ambulatory Visit: Payer: PPO | Attending: Cardiology

## 2022-09-11 DIAGNOSIS — R0602 Shortness of breath: Secondary | ICD-10-CM

## 2022-09-11 LAB — ECHOCARDIOGRAM COMPLETE
AR max vel: 3.47 cm2
AV Area VTI: 3.79 cm2
AV Area mean vel: 3.39 cm2
AV Mean grad: 1 mmHg
AV Peak grad: 1.9 mmHg
Ao pk vel: 0.69 m/s
Area-P 1/2: 3.6 cm2
S' Lateral: 2.9 cm

## 2022-09-13 NOTE — Progress Notes (Signed)
Slight decrease in heart squeeze from 2 years ago is noted. No valvular abnormalities noted. Over all reassuring study.

## 2022-09-18 ENCOUNTER — Telehealth: Payer: Self-pay

## 2022-09-18 DIAGNOSIS — J449 Chronic obstructive pulmonary disease, unspecified: Secondary | ICD-10-CM

## 2022-09-18 NOTE — Progress Notes (Signed)
Care Management & Coordination Services Pharmacy Assistant   Name: Mark Nash  MRN: [id number] DOB: [birth year]  Reason for Encounter: Diabetes  Contacted patient on 09/18/2022 to discuss diabetes disease state.   Recent office visits:  07/17/2022 [name], MD (PCP Office Visit) for Follow-up- Stopped: Doxycycline Hyclate due to patient completing course, Lab orders placed, HM Diabetes Foot Exam order placed, Patient to follow-up in 4 months  06/06/2022 [name], MD (PCP Office Visit) for Leg Swelling- Started: Doxycycline Hyclate 100 mg twice daily, Levofloxacin 500 mg daily, No orders placed, No follow-up noted  Recent consult visits:  08/23/2022 Gerrie Nordmann, NP (Cardiology) for Follow-up- No medication changes noted, Lab order placed, Echocardiogram order placed, Patient to follow-up in 3 months  08/02/2022 Varney Baas, DPM (Podiatry) for Nail Problem- No medication changes noted, No orders placed, Patient instructed to follow-up in 4 months  Hospital visits:  None in previous 6 months  Medications: Outpatient Encounter Medications as of 09/18/2022  Medication Sig   apixaban (ELIQUIS) 5 MG TABS tablet Take 5 mg by mouth 2 (two) times daily. Patient receives via [name] patient assistance through Dec 2023   blood glucose meter kit and supplies KIT Dispense based on patient and insurance preference. Use up to tid E11.65, LON:99   Budeson-Glycopyrrol-Formoterol (BREZTRI AEROSPHERE) 160-9-4.8 MCG/ACT AERO Inhale 2 puffs into the lungs 2 (two) times daily. Patient receives via [employer] Patient Assistance.   diltiazem (DILT-XR) 240 MG 24 hr capsule Take 1 capsule (240 mg total) by mouth daily.   gabapentin (NEURONTIN) 300 MG capsule Take 300-600 mg by mouth See admin instructions. Take 1 capsule (300 mg) by mouth in the morning, take 1 capsule (300 mg) by mouth at noon, and 2 capsules (600 mg) by mouth in the evening   Lancet Devices MISC 1 each by Does not apply  route 3 (three) times daily as needed. What ins will cover dx:E11.65, LON:99 tid   LEVEMIR 100 UNIT/ML injection INJECT 8 UNITS SUBCUTANEOUSLY AT BEDTIME   potassium chloride (KLOR-CON) 10 MEQ tablet Take 1 tablet (10 mEq total) by mouth every Monday, Wednesday, and Friday.   pravastatin (PRAVACHOL) 20 MG tablet Take 1 tablet (20 mg total) by mouth daily with supper.   tiZANidine (ZANAFLEX) 4 MG tablet Take 4 mg by mouth every 12 (twelve) hours.   traMADol (ULTRAM) 50 MG tablet Take 50 mg by mouth every 12 (twelve) hours.   Vitamin D, Ergocalciferol, (DRISDOL) 1.25 MG (50000 UNIT) CAPS capsule TAKE 1 CAPSULE BY MOUTH EVERY TWO WEEKS   No facility-administered encounter medications on file as of 09/18/2022.   Recent Relevant Labs: Lab Results  Component Value Date/Time   HGBA1C 7.6 (A) 07/17/2022 01:33 PM   HGBA1C 7.7 (A) 03/15/2022 02:29 PM   HGBA1C 8.8 (H) 11/11/2021 03:12 PM   HGBA1C 7.1 (H) 11/10/2019 02:27 PM   HGBA1C 6.6 07/11/2019 01:45 PM   HGBA1C 6.8 03/10/2019 10:08 AM   MICROALBUR 2.4 11/11/2021 03:12 PM   MICROALBUR 0.2 09/15/2020 03:00 PM   MICROALBUR 50 07/17/2017 02:17 PM   MICROALBUR negative 03/11/2015 10:59 AM    Kidney Function Lab Results  Component Value Date/Time   CREATININE 0.86 09/01/2022 02:15 PM   CREATININE 0.70 08/23/2022 02:31 PM   CREATININE 0.85 11/11/2021 03:12 PM   CREATININE 0.79 08/18/2021 02:17 PM   GFRNONAA >60 09/01/2022 02:15 PM   GFRNONAA 100 09/15/2020 03:00 PM   GFRAA 116 09/15/2020 03:00 PM   Current antihyperglycemic regimen:  Levemir 100 unit  inject 8 units at bedtime    Patient verbally confirms he is taking the above medications as directed. Yes  What diet changes have been made to improve diabetes control? Patient hasn't made any new changes, but he is trying to eat healthier  What recent interventions/DTPs have been made to improve glycemic control:  None ID  Have there been any recent hospitalizations or ED visits since  last visit with PharmD? No  Patient denies hypoglycemic symptoms, including Pale, Sweaty, Shaky, Hungry, Nervous/irritable, and Vision changes  Patient denies hyperglycemic symptoms, including blurry vision, excessive thirst, fatigue, polyuria, and weakness  How often are you checking your blood sugar? Patient does not check his blood sugars at home  During the week, how often does your blood glucose drop below 70?  Patient doesn't check blood sugars but denies any hypoglycemic symptoms  Are you checking your feet daily/regularly? Yes  Adherence Review: Is the patient currently on a STATIN medication? No Is the patient currently on ACE/ARB medication? Yes Does the patient have >5 day gap between last estimated fill dates? No  Star Rating Drugs:  Pravastatin 20 mg last filled on 07/17/2022 for a 90-Day supply with [facility]: Annual wellness visit in last year? No Last eye exam / retinopathy screening: 08/31/2021 Last diabetic foot exam: 07/17/2022  I spoke with the patient and he reports that he is doing well. Patient stated he recently follow-up with his Cardiologist and they started him on Potassium. Per patient all his other labs were good. Patient denies any side effects or issues since starting the potassium. Patient does not check his blood sugars at home, but denies any hyperglycemic or hypoglycemic symptoms. Patient reports that he is still receiving his Breztri from [employer].  Patient is taking all medications as directed, and currently has no concerns or issues.  Patient has a follow-up with CPP on 11/29/2022 @ [place], CPA/CMA Catering manager Phone: [phone]

## 2022-09-19 ENCOUNTER — Other Ambulatory Visit: Payer: Self-pay | Admitting: Family Medicine

## 2022-09-19 DIAGNOSIS — E1142 Type 2 diabetes mellitus with diabetic polyneuropathy: Secondary | ICD-10-CM

## 2022-09-19 MED ORDER — BREZTRI AEROSPHERE 160-9-4.8 MCG/ACT IN AERO: 2.0000 | INHALATION_SPRAY | Freq: Two times a day (BID) | RESPIRATORY_TRACT | 3 refills | Status: AC

## 2022-09-19 NOTE — Addendum Note (Signed)
Addended by: Daron Offer A on: 09/19/2022 02:04 PM   Modules accepted: Orders

## 2022-09-26 ENCOUNTER — Telehealth: Payer: Self-pay | Admitting: Cardiology

## 2022-09-26 MED ORDER — APIXABAN 5 MG PO TABS: 5.0000 mg | ORAL_TABLET | Freq: Two times a day (BID) | ORAL | 1 refills | Status: DC

## 2022-09-26 NOTE — Telephone Encounter (Signed)
*  STAT* If patient is at the pharmacy, call can be transferred to refill team.   1. Which medications need to be refilled? (please list name of each medication and dose if known) apixaban (ELIQUIS) 5 MG TABS tablet  2. Which pharmacy/location (including street and city if local pharmacy) is medication to be sent to? [place], [place]    3. Do they need a 30 day or 90 day supply?  30 day supply

## 2022-09-27 DIAGNOSIS — F1120 Opioid dependence, uncomplicated: Secondary | ICD-10-CM | POA: Diagnosis not present

## 2022-09-27 DIAGNOSIS — Z79891 Long term (current) use of opiate analgesic: Secondary | ICD-10-CM | POA: Diagnosis not present

## 2022-09-27 DIAGNOSIS — Z720 Tobacco use: Secondary | ICD-10-CM | POA: Diagnosis not present

## 2022-09-27 DIAGNOSIS — M961 Postlaminectomy syndrome, not elsewhere classified: Secondary | ICD-10-CM | POA: Diagnosis not present

## 2022-09-27 DIAGNOSIS — M5416 Radiculopathy, lumbar region: Secondary | ICD-10-CM | POA: Diagnosis not present

## 2022-09-27 DIAGNOSIS — M545 Low back pain, unspecified: Secondary | ICD-10-CM | POA: Diagnosis not present

## 2022-09-27 DIAGNOSIS — E119 Type 2 diabetes mellitus without complications: Secondary | ICD-10-CM | POA: Diagnosis not present

## 2022-09-27 DIAGNOSIS — G8929 Other chronic pain: Secondary | ICD-10-CM | POA: Diagnosis not present

## 2022-09-27 DIAGNOSIS — F209 Schizophrenia, unspecified: Secondary | ICD-10-CM | POA: Diagnosis not present

## 2022-09-27 DIAGNOSIS — Z716 Tobacco abuse counseling: Secondary | ICD-10-CM | POA: Diagnosis not present

## 2022-09-27 DIAGNOSIS — M5430 Sciatica, unspecified side: Secondary | ICD-10-CM | POA: Diagnosis not present

## 2022-09-27 DIAGNOSIS — I10 Essential (primary) hypertension: Secondary | ICD-10-CM | POA: Diagnosis not present

## 2022-09-29 ENCOUNTER — Ambulatory Visit (INDEPENDENT_AMBULATORY_CARE_PROVIDER_SITE_OTHER): Payer: PPO

## 2022-09-29 VITALS — Ht 71.0 in | Wt 161.0 lb

## 2022-09-29 DIAGNOSIS — Z0000 Encounter for general adult medical examination without abnormal findings: Secondary | ICD-10-CM

## 2022-09-29 NOTE — Progress Notes (Signed)
Virtual Visit via Telephone Note  I connected with  Mark Nash on 09/29/22 at  1:00 PM EST by telephone and verified that I am speaking with the correct person using two identifiers.  Location: Patient: home Provider: [facility] Persons participating in the virtual visit: patient/Nurse Health Advisor   I discussed the limitations, risks, security and privacy concerns of performing an evaluation and management service by telephone and the availability of in person appointments. The patient expressed understanding and agreed to proceed.  Interactive audio and video telecommunications were attempted between this nurse and patient, however failed, due to patient having technical difficulties OR patient did not have access to video capability.  We continued and completed visit with audio only.  Some vital signs may be absent or patient reported.   Roger Shelter, LPN  Subjective:   Mark Nash is a 67 y.o. male who presents for Medicare Annual/Subsequent preventive examination.  Review of Systems     Cardiac Risk Factors include: advanced age (>77mn, >>46women)     Objective:    Today's Vitals   09/29/22 1258 09/29/22 1259  Weight: 161 lb (73 kg)   Height: '5\' 11"'$  (1.803 m)   PainSc:  7    Body mass index is 22.45 kg/m.     09/29/2022    1:12 PM 06/09/2021    8:31 AM 05/20/2021    6:48 AM 04/19/2021   10:15 PM 12/02/2020    9:40 AM 11/20/2019    2:28 PM 11/18/2018   11:02 AM  Advanced Directives  Does Patient Have a Medical Advance Directive? No No No No No No No  Would patient like information on creating a medical advance directive? Yes (ED - Information included in AVS) No - Patient declined No - Patient declined No - Patient declined Yes (MAU/Ambulatory/Procedural Areas - Information given) Yes (MAU/Ambulatory/Procedural Areas - Information given) No - Patient declined    Current Medications (verified) Outpatient Encounter Medications as of 09/29/2022  Medication  Sig   apixaban (ELIQUIS) 5 MG TABS tablet Take 1 tablet (5 mg total) by mouth 2 (two) times daily. Patient [place] via [facility] through Dec 2023   blood glucose meter kit and supplies KIT Dispense based on patient and insurance preference. Use up to tid E11.65, LON:99   Budeson-Glycopyrrol-Formoterol (BREZTRI AEROSPHERE) 160-9-4.8 MCG/ACT AERO Inhale 2 puffs into the lungs 2 (two) times daily. Patient receives via [place] Patient Assistance.   diltiazem (DILT-XR) 240 MG 24 hr capsule Take 1 capsule (240 mg total) by mouth daily.   gabapentin (NEURONTIN) 300 MG capsule Take 300-600 mg by mouth See admin instructions. Take 1 capsule (300 mg) by mouth in the morning, take 1 capsule (300 mg) by mouth at noon, and 2 capsules (600 mg) by mouth in the evening   Lancet Devices MISC 1 each by Does not apply route 3 (three) times daily as needed. What ins will cover dx:E11.65, LON:99 tid   LEVEMIR 100 UNIT/ML injection INJECT 8 UNITS SUBCUTANEOUSLY AT BEDTIME   potassium chloride (KLOR-CON) 10 MEQ tablet Take 1 tablet (10 mEq total) by mouth every Monday, Wednesday, and Friday.   pravastatin (PRAVACHOL) 20 MG tablet Take 1 tablet (20 mg total) by mouth daily with supper.   tiZANidine (ZANAFLEX) 4 MG tablet Take 4 mg by mouth every 12 (twelve) hours.   traMADol (ULTRAM) 50 MG tablet Take 50 mg by mouth every 12 (twelve) hours.   Vitamin D, Ergocalciferol, (DRISDOL) 1.25 MG (50000 UNIT) CAPS  capsule TAKE 1 CAPSULE BY MOUTH EVERY TWO WEEKS   No facility-administered encounter medications on file as of 09/29/2022.    Allergies (verified) Penicillins, Aspirin, Ibuprofen, Lyrica [pregabalin], and Acetaminophen   History: Past Medical History:  Diagnosis Date   Arthritis    Asthma    Benign neoplasm of ascending colon    polyps   Constipation    COPD (chronic obstructive pulmonary disease) (HCC)    NO INHALER   Diabetes mellitus without complication (HCC)    Type 2    Diverticulosis    Dyspnea    RARE-WITH EXERTION DUE TO COPD   GERD (gastroesophageal reflux disease)    Hepatitis C    History of kidney stones    H/O   Hyperlipidemia    Hypertension 06/10/2018   currently not under control   Left inguinal hernia    Peripheral vascular disease (HCC)    Restless legs    Schizoaffective disorder (HCC)    Pt denies   Thrombocytopenia ([place])    Vitamin D deficiency    Wears dentures    full upper and lower   Past Surgical History:  Procedure Laterality Date   BACK SURGERY     CARDIOVERSION N/A 05/20/2021   Procedure: CARDIOVERSION;  Surgeon: Wellington Hampshire, MD;  Location: [facility];  Service: Cardiovascular;  Laterality: N/A;   CATARACT EXTRACTION W/PHACO Right 10/31/2021   Procedure: CATARACT EXTRACTION PHACO AND INTRAOCULAR LENS PLACEMENT ([place]) RIGHT DIABETIC;  Surgeon: Eulogio Bear, MD;  Location: [place];  Service: Ophthalmology;  Laterality: Right;  Diabetic 4.34 00:42.8   COLONOSCOPY WITH PROPOFOL N/A 09/07/2017   Procedure: COLONOSCOPY WITH PROPOFOL;  Surgeon: Virgel Manifold, MD;  Location: [facility];  Service: Endoscopy;  Laterality: N/A;   ESOPHAGOGASTRODUODENOSCOPY (EGD) WITH PROPOFOL N/A 12/07/2017   Procedure: ESOPHAGOGASTRODUODENOSCOPY (EGD) WITH PROPOFOL;  Surgeon: Virgel Manifold, MD;  Location: [facility];  Service: Endoscopy;  Laterality: N/A;   EYE SURGERY     FLEXIBLE SIGMOIDOSCOPY N/A 11/09/2017   Procedure: FLEXIBLE SIGMOIDOSCOPY;  Surgeon: Virgel Manifold, MD;  Location: [facility];  Service: Endoscopy;  Laterality: N/A;   HERNIA REPAIR     inguinal   INGUINAL HERNIA REPAIR Bilateral 06/21/2018   Procedure: LAPAROSCOPIC BILATERAL INGUINAL HERNIA REPAIR;  Surgeon: Vickie Epley, MD;  Location: [facility];  Service: General;  Laterality: Bilateral;   L3 TO L5 LAMINECTOMY FOR DECOMPRESSION  07/17/2016   [facility]   REPAIR OF CEREBROSPINAL FLUID LEAK N/A  09/08/2016   Procedure: Lumbar wound exploration, repair of pseudomenigocele;  Surgeon: Kevan Ny Ditty, MD;  Location: [place];  Service: Neurosurgery;  Laterality: N/A;  Lumbar wound exploration, repair of pseudomenigocele   SINUSOTOMY     Family History  Problem Relation Age of Onset   Heart failure Mother    Hypertension Mother    Diverticulitis Mother    Colitis Mother    Diabetes Father    Hypertension Father    Heart disease Father    Diabetes Sister    Heart failure Brother    Diverticulitis Brother    Social History   Socioeconomic History   Marital status: Divorced    Spouse name: Not on file   Number of children: 2   Years of education: Not on file   Highest education level: 12th grade  Occupational History   Occupation: Disabled  Tobacco Use   Smoking status: Every Day    Packs/day: 1.50    Years: 46.00  Total pack years: 69.00    Types: Cigarettes   Smokeless tobacco: Never  Vaping Use   Vaping Use: Never used  Substance and Sexual Activity   Alcohol use: No    Alcohol/week: 0.0 standard drinks of alcohol   Drug use: No   Sexual activity: Not Currently  Other Topics Concern   Not on file  Social History Narrative   Lives alone   Social Determinants of Health   Financial Resource Strain: Low Risk  (09/29/2022)   Overall Financial Resource Strain (CARDIA)    Difficulty of Paying Living Expenses: Not hard at all  Food Insecurity: No Food Insecurity (09/29/2022)   Hunger Vital Sign    Worried About Running Out of Food in the Last Year: Never true    Ran Out of Food in the Last Year: Never true  Transportation Needs: No Transportation Needs (09/29/2022)   PRAPARE - Hydrologist (Medical): No    Lack of Transportation (Non-Medical): No  Physical Activity: Insufficiently Active (09/29/2022)   Exercise Vital Sign    Days of Exercise per Week: 1 day    Minutes of Exercise per Session: 20 min  Stress: No Stress Concern Present  (09/29/2022)   [place] of Stress : Not at all  Social Connections: Unknown (09/29/2022)   Social Connection and Isolation Panel [NHANES]    Frequency of Communication with Friends and Family: Once a week    Frequency of Social Gatherings with Friends and Family: Not on file    Attends Religious Services: Never    Marine scientist or Organizations: No    Attends Music therapist: Never    Marital Status: Divorced    Tobacco Counseling Ready to quit: Not Answered Counseling given: Not Answered   Clinical Intake:  Pre-visit preparation completed: Yes  Pain : 0-10 Pain Score: 7  Pain Type: Chronic pain Pain Location: Back (both legs) Pain Orientation: Lower Pain Descriptors / Indicators: Aching, Shooting, Radiating Pain Onset: More than a month ago Pain Frequency: Constant Pain Relieving Factors: medication, heat for back  Pain Relieving Factors: medication, heat for back  BMI - recorded: 22.45 Nutritional Status: BMI of 19-24  Normal Nutritional Risks: None Diabetes: Yes CBG done?: No (televist-pt does not take BS;has meter) Did pt. bring in CBG monitor from home?: No (televisit)  How often do you need to have someone help you when you read instructions, pamphlets, or other written materials from your doctor or pharmacy?: 1 - Never  Diabetic?yes  Interpreter Needed?: No  Information entered by :: B.Roselyn Doby,LPN   Activities of Daily Living    09/29/2022    1:13 PM 07/17/2022    1:31 PM  In your present state of health, do you have any difficulty performing the following activities:  Hearing? 0 0  Vision? 0 0  Difficulty concentrating or making decisions? 0 0  Walking or climbing stairs? 0 0  Dressing or bathing? 0 0  Doing errands, shopping? 0 0  Preparing Food and eating ? N   Using the Toilet? N   In the past six months, have you accidently leaked urine? N   Do  you have problems with loss of bowel control? N   Managing your Medications? N   Managing your Finances? N   Housekeeping or managing your Housekeeping? N     Patient Care Team: [name], MD as PCP - General (  Family Medicine) Vickie Epley, MD as PCP - Electrophysiology (Cardiology) Minna Merritts, MD as PCP - Cardiology (Cardiology) Virgel Manifold, MD (Inactive) as Consulting Physician (Gastroenterology) Leim Fabry, MD as Consulting Physician (Orthopedic Surgery) Caroline More, DPM as Consulting Physician (Podiatry) Nathanial Rancher, MD as Referring Physician (Pain Medicine) End, Harrell Gave, MD as Consulting Physician (Cardiology) Germaine Pomfret, [facility] (Pharmacist)  Indicate any recent Medical Services you may have received from other than [facility] providers in the past year (date may be approximate).     Assessment:   This is a routine wellness examination for Thor.  Hearing/Vision screen Hearing Screening - Comments:: Adequate hearing Vision Screening - Comments:: Adequate vision.Dr Edison Pace has appt in March 2024 Cataract surgery rt eye last year  Dietary issues and exercise activities discussed: Current Exercise Habits: Home exercise routine, Type of exercise: walking, Time (Minutes): 20, Frequency (Times/Week): 1, Weekly Exercise (Minutes/Week): 20, Intensity: Mild, Exercise limited by: None identified   Goals Addressed             This Visit's Progress    Monitor and Manage My Blood Sugar-Diabetes Type 2   Not on track    Timeframe:  Long-Range Goal Priority:  High Start Date: 07/25/2021                            Expected End Date: 07/25/2022                      Follow Up within 90 days   -check blood sugar at least 5 times throughout the day - check blood sugar before and after exercise - check blood sugar if I feel it is too high or too low - take the blood sugar meter to all doctor visits    Why is this important?   Checking your  blood sugar at home helps to keep it from getting very high or very low.  Writing the results in a diary or log helps the doctor know how to care for you.  Your blood sugar log should have the time, date and the results.  Also, write down the amount of insulin or other medicine that you take.  Other information, like what you ate, exercise done and how you were feeling, will also be helpful.     Notes:      Quit Smoking   Not on track    If you wish to quit smoking, help is available. For free tobacco cessation program offerings call the [facility] at [phone] or Live Well Line at 9280019352. You may also visit [email or web address] or email [email or web address] for more information on other programs.         Depression Screen    09/29/2022    1:09 PM 07/17/2022    1:31 PM 06/06/2022   11:33 AM 03/15/2022    2:15 PM 11/11/2021    2:03 PM 09/19/2021    1:38 PM 08/18/2021    1:37 PM  PHQ 2/9 Scores  PHQ - 2 Score 0 0 0 0 0 0 0  PHQ- 9 Score  0 0  0 0 0    Fall Risk    09/29/2022    1:04 PM 07/17/2022    1:31 PM 06/06/2022   11:33 AM 03/15/2022    2:15 PM 11/11/2021    2:03 PM  [place] in the past year? 0 0 0 0 1  Number falls in past yr: 0 0 0 0 0  Injury with Fall? 0 0 0 0 0  Risk for fall due to : No Fall Risks No Fall Risks No Fall Risks No Fall Risks Impaired balance/gait  Follow up Education provided;Falls prevention discussed Falls prevention discussed Falls prevention discussed Falls prevention discussed Falls prevention discussed    FALL RISK PREVENTION PERTAINING TO THE HOME:  Any stairs in or around the home? no If so, are there any without handrails? No  Home free of loose throw rugs in walkways, pet beds, electrical cords, etc? yes Adequate lighting in your home to reduce risk of falls? Yes   ASSISTIVE DEVICES UTILIZED TO PREVENT FALLS:  Life alert? No  Use of a cane, walker or w/c? No  Grab bars in the bathroom? No   Shower chair or bench in shower? No  Elevated toilet seat or a handicapped toilet? No   TCognitive Function:        09/29/2022    1:16 PM 11/30/2017    3:14 PM  6CIT Screen  What Year? 0 points 0 points  What month? 0 points 0 points  What time? 0 points 0 points  Count back from 20 0 points 0 points  Months in reverse 0 points 0 points  Repeat phrase 0 points 0 points  Total Score 0 points 0 points    Immunizations Immunization History  Administered Date(s) Administered   Fluad Quad(high Dose 65+) 04/28/2021, 06/06/2022   Influenza Inj Mdck Quad Pf 07/11/2019   Influenza, High Dose Seasonal PF 06/30/2016   Influenza,inj,Quad PF,6+ Mos 07/17/2017, 05/29/2018   Influenza-Unspecified 06/21/2020   PFIZER(Purple Top)SARS-COV-2 Vaccination 11/13/2019, 12/09/2019, 08/04/2020   PNEUMOCOCCAL CONJUGATE-20 09/19/2021   Pneumococcal Conjugate-13 07/13/2015   Pneumococcal Polysaccharide-23 12/23/2009, 03/29/2020   Tdap 12/23/2009, 03/29/2020   Zoster Recombinat (Shingrix) 03/16/2021, 07/23/2021    TDAP status: Up to date  Flu Vaccine status: Up to date  Pneumococcal vaccine status: Up to date  Covid-19 vaccine status: Completed vaccines  Qualifies for Shingles Vaccine? Yes   Zostavax completed Yes   Shingrix Completed?: Yes  Screening Tests Health Maintenance  Topic Date Due   Lung Cancer Screening  Never done   COVID-19 Vaccine (4 - 2023-24 season) 04/28/2022   OPHTHALMOLOGY EXAM  08/31/2022   COLONOSCOPY (Pts 45-46yr Insurance coverage will need to be confirmed)  11/12/2022 (Originally 09/07/2018)   Diabetic kidney evaluation - Urine ACR  11/12/2022   HEMOGLOBIN A1C  01/15/2023   FOOT EXAM  04/20/2023   Diabetic kidney evaluation - eGFR measurement  09/02/2023   Medicare Annual Wellness (AWV)  09/30/2023   DTaP/Tdap/Td (3 - Td or Tdap) 03/29/2030   Pneumonia Vaccine 68 Years old  Completed   INFLUENZA VACCINE  Completed   Hepatitis C Screening  Completed   Zoster  Vaccines- Shingrix  Completed   HPV VACCINES  Aged Out    Health Maintenance  Health Maintenance Due  Topic Date Due   Lung Cancer Screening  Never done   COVID-19 Vaccine (4 - 2023-24 season) 04/28/2022   OPHTHALMOLOGY EXAM  08/31/2022    Colorectal cancer screening: Referral to GI placed NO. Pt aware the office will call re: appt. Pt states he has no one to accompany him to procedure, wants to hold off for now.  Lung Cancer Screening: (Low Dose CT Chest recommended if Age 68-80years, 30 pack-year currently smoking OR have quit w/in 15years.) does not qualify.   Lung Cancer Screening Referral: no  Additional Screening:  Hepatitis C Screening: does not qualify; Completed no  Vision Screening: Recommended annual ophthalmology exams for early detection of glaucoma and other disorders of the eye. Is the patient up to date with their annual eye exam?  Yes  Who is the provider or what is the name of the office in which the patient attends annual eye exams? Dr Edison Pace If pt is not established with a provider, would they like to be referred to a provider to establish care? No .   Dental Screening: Recommended annual dental exams for proper oral hygiene  Community Resource Referral / Chronic Care Management: CRR required this visit?  No   CCM required this visit?  No      Plan:     I have personally reviewed and noted the following in the patient's chart:   Medical and social history Use of alcohol, tobacco or illicit drugs  Current medications and supplements including opioid prescriptions. Patient is currently taking opioid prescriptions. Information provided to patient regarding non-opioid alternatives. Patient advised to discuss non-opioid treatment plan with their provider. **Pt  says he has appts at the pain **clinic  Functional ability and status Nutritional status Physical activity Advanced directives List of other physicians Hospitalizations, surgeries, and ER visits  in previous 12 months Vitals Screenings to include cognitive, depression, and falls Referrals and appointments  In addition, I have reviewed and discussed with patient certain preventive protocols, quality metrics, and best practice recommendations. A written personalized care plan for preventive services as well as general preventive health recommendations were provided to patient.     Roger Shelter, LPN   03/28/7710   Nurse Notes: Pt is due for colonoscopy but wants to hold ofright now as he is trying to work with outside agency to accompany him to procedure. He indicates he has no family here and difficult to coordinate with them.

## 2022-09-29 NOTE — Patient Instructions (Signed)
[name] , Thank you for taking time to come for your Medicare Wellness Visit. I appreciate your ongoing commitment to your health goals. Please review the following plan we discussed and let me know if I can assist you in the future.   These are the goals we discussed:  Goals      DIET - Reduce sweets and carbohydrate intake     Recommend to decrease portion sizes by eating 3 small healthy meals and at least 2 healthy snacks per day.     Monitor and Manage My Blood Sugar-Diabetes Type 2     Timeframe:  Long-Range Goal Priority:  High Start Date: 07/25/2021                            Expected End Date: 07/25/2022                      Follow Up within 90 days   -check blood sugar at least 5 times throughout the day - check blood sugar before and after exercise - check blood sugar if I feel it is too high or too low - take the blood sugar meter to all doctor visits    Why is this important?   Checking your blood sugar at home helps to keep it from getting very high or very low.  Writing the results in a diary or log helps the doctor know how to care for you.  Your blood sugar log should have the time, date and the results.  Also, write down the amount of insulin or other medicine that you take.  Other information, like what you ate, exercise done and how you were feeling, will also be helpful.     Notes:      Quit Smoking     If you wish to quit smoking, help is available. For free tobacco cessation program offerings call the [facility] at (714) 282-7069 or [facility] Line at 2366786115. You may also visit [email or web address] or email [email or web address] for more information on other programs.          This is a list of the screening recommended for you and due dates:  Health Maintenance  Topic Date Due   Screening for Lung Cancer  Never done   COVID-19 Vaccine (4 - 2023-24 season) 04/28/2022   Eye exam for diabetics  08/31/2022   Colon Cancer  Screening  11/12/2022*   Yearly kidney health urinalysis for diabetes  11/12/2022   Hemoglobin A1C  01/15/2023   Complete foot exam   04/20/2023   Yearly kidney function blood test for diabetes  09/02/2023   Medicare Annual Wellness Visit  09/30/2023   DTaP/Tdap/Td vaccine (3 - Td or Tdap) 03/29/2030   Pneumonia Vaccine  Completed   Flu Shot  Completed   Hepatitis C Screening: USPSTF Recommendation to screen - Ages 64-79 yo.  Completed   Zoster (Shingles) Vaccine  Completed   HPV Vaccine  Aged Out  *Topic was postponed. The date shown is not the original due date.    Advanced directives: no  Conditions/risks identified: none  Next appointment: Follow up in one year for your annual wellness visit. 10/04/2023 @ 1:30pm/telephone  Preventive Care 65 Years and Older, Male  Preventive care refers to lifestyle choices and visits with your health care provider that can promote health and wellness. What does preventive care include? A yearly physical exam. This is  also called an annual well check. Dental exams once or twice a year. Routine eye exams. Ask your health care provider how often you should have your eyes checked. Personal lifestyle choices, including: Daily care of your teeth and gums. Regular physical activity. Eating a healthy diet. Avoiding tobacco and drug use. Limiting alcohol use. Practicing safe sex. Taking low doses of aspirin every day. Taking vitamin and mineral supplements as recommended by your health care provider. What happens during an annual well check? The services and screenings done by your health care provider during your annual well check will depend on your age, overall health, lifestyle risk factors, and family history of disease. Counseling  Your health care provider may ask you questions about your: Alcohol use. Tobacco use. Drug use. Emotional well-being. Home and relationship well-being. Sexual activity. Eating habits. History of  falls. Memory and ability to understand (cognition). Work and work Statistician. Screening  You may have the following tests or measurements: Height, weight, and BMI. Blood pressure. Lipid and cholesterol levels. These may be checked every 5 years, or more frequently if you are over 46 years old. Skin check. Lung cancer screening. You may have this screening every year starting at age 2 if you have a 30-pack-year history of smoking and currently smoke or have quit within the past 15 years. Fecal occult blood test (FOBT) of the stool. You may have this test every year starting at age 67. Flexible sigmoidoscopy or colonoscopy. You may have a sigmoidoscopy every 5 years or a colonoscopy every 10 years starting at age 60. Prostate cancer screening. Recommendations will vary depending on your family history and other risks. Hepatitis C blood test. Hepatitis B blood test. Sexually transmitted disease (STD) testing. Diabetes screening. This is done by checking your blood sugar (glucose) after you have not eaten for a while (fasting). You may have this done every 1-3 years. Abdominal aortic aneurysm (AAA) screening. You may need this if you are a current or former smoker. Osteoporosis. You may be screened starting at age 15 if you are at high risk. Talk with your health care provider about your test results, treatment options, and if necessary, the need for more tests. Vaccines  Your health care provider may recommend certain vaccines, such as: Influenza vaccine. This is recommended every year. Tetanus, diphtheria, and acellular pertussis (Tdap, Td) vaccine. You may need a Td booster every 10 years. Zoster vaccine. You may need this after age 21. Pneumococcal 13-valent conjugate (PCV13) vaccine. One dose is recommended after age 57. Pneumococcal polysaccharide (PPSV23) vaccine. One dose is recommended after age 79. Talk to your health care provider about which screenings and vaccines you need and  how often you need them. This information is not intended to replace advice given to you by your health care provider. Make sure you discuss any questions you have with your health care provider. Document Released: 09/10/2015 Document Revised: 05/03/2016 Document Reviewed: 06/15/2015 Elsevier Interactive Patient Education  2017 [place] Prevention in the Home Falls can cause injuries. They can happen to people of all ages. There are many things you can do to make your home safe and to help prevent falls. What can I do on the outside of my home? Regularly fix the edges of walkways and driveways and fix any cracks. Remove anything that might make you trip as you walk through a door, such as a raised step or threshold. Trim any bushes or trees on the path to your home. Use bright  outdoor lighting. Clear any walking paths of anything that might make someone trip, such as rocks or tools. Regularly check to see if handrails are loose or broken. Make sure that both sides of any steps have handrails. Any raised decks and porches should have guardrails on the edges. Have any leaves, snow, or ice cleared regularly. Use sand or salt on walking paths during winter. Clean up any spills in your garage right away. This includes oil or grease spills. What can I do in the bathroom? Use night lights. Install grab bars by the toilet and in the tub and shower. Do not use towel bars as grab bars. Use non-skid mats or decals in the tub or shower. If you need to sit down in the shower, use a plastic, non-slip stool. Keep the floor dry. Clean up any water that spills on the floor as soon as it happens. Remove soap buildup in the tub or shower regularly. Attach bath mats securely with double-sided non-slip rug tape. Do not have throw rugs and other things on the floor that can make you trip. What can I do in the bedroom? Use night lights. Make sure that you have a light by your bed that is easy to  reach. Do not use any sheets or blankets that are too big for your bed. They should not hang down onto the floor. Have a firm chair that has side arms. You can use this for support while you get dressed. Do not have throw rugs and other things on the floor that can make you trip. What can I do in the kitchen? Clean up any spills right away. Avoid walking on wet floors. Keep items that you use a lot in easy-to-reach places. If you need to reach something above you, use a strong step stool that has a grab bar. Keep electrical cords out of the way. Do not use floor polish or wax that makes floors slippery. If you must use wax, use non-skid floor wax. Do not have throw rugs and other things on the floor that can make you trip. What can I do with my stairs? Do not leave any items on the stairs. Make sure that there are handrails on both sides of the stairs and use them. Fix handrails that are broken or loose. Make sure that handrails are as long as the stairways. Check any carpeting to make sure that it is firmly attached to the stairs. Fix any carpet that is loose or worn. Avoid having throw rugs at the top or bottom of the stairs. If you do have throw rugs, attach them to the floor with carpet tape. Make sure that you have a light switch at the top of the stairs and the bottom of the stairs. If you do not have them, ask someone to add them for you. What else can I do to help prevent falls? Wear shoes that: Do not have high heels. Have rubber bottoms. Are comfortable and fit you well. Are closed at the toe. Do not wear sandals. If you use a stepladder: Make sure that it is fully opened. Do not climb a closed stepladder. Make sure that both sides of the stepladder are locked into place. Ask someone to hold it for you, if possible. Clearly mark and make sure that you can see: Any grab bars or handrails. First and last steps. Where the edge of each step is. Use tools that help you move  around (mobility aids) if they are needed. These  include: Canes. Walkers. Scooters. Crutches. Turn on the lights when you go into a dark area. Replace any light bulbs as soon as they burn out. Set up your furniture so you have a clear path. Avoid moving your furniture around. If any of your floors are uneven, fix them. If there are any pets around you, be aware of where they are. Review your medicines with your doctor. Some medicines can make you feel dizzy. This can increase your chance of falling. Ask your doctor what other things that you can do to help prevent falls. This information is not intended to replace advice given to you by your health care provider. Make sure you discuss any questions you have with your health care provider. Document Released: 06/10/2009 Document Revised: 01/20/2016 Document Reviewed: 09/18/2014 Elsevier Interactive Patient Education  2017 [employer].

## 2022-10-05 ENCOUNTER — Encounter (HOSPITAL_COMMUNITY): Payer: Self-pay | Admitting: *Deleted

## 2022-10-19 ENCOUNTER — Encounter: Payer: Self-pay | Admitting: *Deleted

## 2022-10-19 ENCOUNTER — Telehealth: Payer: Self-pay | Admitting: *Deleted

## 2022-10-19 NOTE — Patient Outreach (Signed)
  Care Coordination   Initial Visit Note   10/19/2022 Name: Mark Nash MRN: [id number] DOB: [birth year]  Mark Nash is a 67 y.o. year old male who sees [name], MD for primary care. I spoke with  [name] by phone today.  What matters to the patients health and wellness today?  Remaining independent with minimal assistance (will need help with transportation to colonoscopy).      Goals Addressed             This Visit's Progress    Effective management of DM       Care Coordination Interventions: Provided education to patient about basic DM disease process Reviewed medications with patient and discussed importance of medication adherence Discussed plans with patient for ongoing care management follow up and provided patient with direct contact information for care management team Reviewed scheduled/upcoming provider appointments including: Podiatry on 3/13, PCP on 3/20, and cardiology on 3/29 Screening for signs and symptoms of depression related to chronic disease state  Assessed social determinant of health barriers Discussed health maintenance, will need assistance with transportation for colonscopy, family lives out of town.  He will notify this care manager when scheduled so resources can be provided. A1C currently 7.6, goal of less than 7        Interventions Today    Flowsheet Row Most Recent Value  Chronic Disease   Chronic disease during today's visit Diabetes  General Interventions   General Interventions Discussed/Reviewed General Interventions Discussed, Annual Eye Exam, Annual Foot Exam  Education Interventions   Education Provided Provided Printed Education  Nutrition Interventions   Nutrition Discussed/Reviewed Nutrition Discussed, Adding fruits and vegetables, Decreasing sugar intake       SDOH assessments and interventions completed:  Yes  SDOH Interventions Today    Flowsheet Row Most Recent Value  SDOH Interventions   Food  Insecurity Interventions Intervention Not Indicated  Housing Interventions Intervention Not Indicated  Transportation Interventions Intervention Not Indicated        Care Coordination Interventions:  Yes, provided   Follow up plan: Follow up call scheduled for 5/3    Encounter Outcome:  Pt. Visit Completed   Valente David, RN, MSN, [facility] Care Management Coordinator [phone]

## 2022-10-19 NOTE — Patient Instructions (Signed)
Visit Information  Thank you for taking time to visit with me today. Please don't hesitate to contact me if I can be of assistance to you before our next scheduled telephone appointment.  Following are the goals we discussed today:  Notify this RNCM when colonscopy is scheduled.  Continue monitoring blood sugars and maintaining diet.   Our next appointment is by telephone on 5/3  Please call the care guide team at [phone] if you need to cancel or reschedule your appointment.   Please call the Suicide and Crisis Lifeline: 988 call the [place] National Suicide Prevention Lifeline: 667-803-7706 or TTY: (941)330-4754 TTY (534) 602-5039) to talk to a trained counselor call 1-800-273-TALK (toll free, 24 hour hotline) call 911 if you are experiencing a Mental Health or [place] or need someone to talk to.  The patient verbalized understanding of instructions, educational materials, and care plan provided today and agreed to receive a mailed copy of patient instructions, educational materials, and care plan.   The patient has been provided with contact information for the care management team and has been advised to call with any health related questions or concerns.   [facility] Care Management Coordinator [phone]

## 2022-11-08 DIAGNOSIS — E1142 Type 2 diabetes mellitus with diabetic polyneuropathy: Secondary | ICD-10-CM | POA: Diagnosis not present

## 2022-11-08 DIAGNOSIS — B351 Tinea unguium: Secondary | ICD-10-CM | POA: Diagnosis not present

## 2022-11-08 DIAGNOSIS — L851 Acquired keratosis [keratoderma] palmaris et plantaris: Secondary | ICD-10-CM | POA: Diagnosis not present

## 2022-11-08 DIAGNOSIS — I739 Peripheral vascular disease, unspecified: Secondary | ICD-10-CM | POA: Diagnosis not present

## 2022-11-14 NOTE — Progress Notes (Unsigned)
Name: Mark Nash   MRN: [id number]    DOB: 1956-08-21   Date:11/15/2022       Progress Note  Subjective  Chief Complaint  Follow Up  HPI  Lumbar radiculitis: he had back surgery in 06/2016 and 08/2016. He is currently doing okay. Currently seeing  Dr. Humphrey Rolls and is taking , Tramadol, Tizanidine and higher dose gabapentin 300 mg morning, lunch and 2 at night, back pain is well controlled at this time 6/10    DMII: he is on Levemir 8 units and we will change to [place] since medication is getting discontinued by manufacture His  A1C went up to 8.8 % down to 7.6% ant today is 7.4 %   . He is on ACE , statins and gabapentin for neuropathy.  He also has PVD.  He denies polyphagia,he has dry mouth and drinks water throughout the day  He is trying to eat healthier. I am afraid to go up on dose of insulin since he is not checking glucose at home, we will add Actos today , discussed possible side effects   PAD: he had multiple episodes of cellulitis in 2020/12/15 and surgeon decided to hold off on surgical procedure , he is on medical management. He continues to have claudication but is better, able to walk for 20-30 minutes . He is using compression stocking prn, currently doing well and only using diabetic socks. He is up to date with follow ups with vascular surgeon . Last visit August 2023    COPD Moderate : he always has daily morning  productive cough. He has some SOB with activity but doing better since taking maintenance medication,- Brezti  .  He is still smoking 1.5 - 2 packs daily, he has not been able to stop smoking    Hyperlipidemia: taking Pravastatin 20 mg daily and denies myalgias. Last LDL was 43, continue medication. Unchanged    HTN: taking Diltiazem for afib and bp, BP is at goal today , no chest pain or palpitation    Schizophrenia/paranoid type : denies any hallucinations, no depressed mood or suicidal thoughts or ideation, not on medication and refuses to see Psychiatrist. He states  he helps his family.  Mother died in 2023-03-08  and brother died 2021/12/15, he still has one sister. He states doing okay at this time    Hepatitis C antibody positive: he has a remote history of drug use from age 56 till 19. Used injectable drug use at the time, he has positive hepatitis C , he went to see GI but did not discuss hepatitis C. He has seen GI, he has a large polyp removed 12-15-17 and was supposed to go back for repeat colonoscopy but he keeps postponing , he refuses referral again    Chronic Constipation: he states bowel movements have improvement   He had EGD and colonoscopy in 2017/12/15 , past due for repeat testing secondary to large sigmoid polyp,he had covid, followed by taking care of mother, brother and heart ablation, advised him to go back to have colonoscopy now - he states he has not scheduled due to problems with transportation. I sent referral to [place] , he still needs transportation to have it done , but he is not ready to go yet.    Senile purpura: both arms. on both upper extremity, he is on Eliquis now .   Afib: found on auscultation during office visit , CHADVasc score of 4, he is under the care of cardiologist  ,  rate controlled with Diltiazem and he is now on Eliquis, he has easy bruising that is stable.   Ulceration on lower extremity: he went grocery shopping last week and thinks he rubbed his right lower leg on the car, that night he noticed a blister , that popped, he is using tape to keep it covered, has some serous drainage.   Patient Active Problem List   Diagnosis Date Noted   Chronic a-fib ([place]) 03/15/2022   Persistent atrial fibrillation ([place]) 05/07/2021   Type 2 diabetes mellitus with diabetic polyneuropathy, with long-term current use of insulin ([place]) 03/16/2021   DDD (degenerative disc disease), lumbar 03/06/2019   Primary osteoarthritis involving multiple joints 03/06/2019   Lumbar radiculopathy 11/05/2018   Lumbar post-laminectomy syndrome 11/05/2018    Diabetic polyneuropathy associated with type 2 diabetes mellitus ([place]) 11/05/2018   CLE (columnar lined esophagus)    Gastric irritation    Duodenum ulcer    Personal history of colonic polyps    Polyp of sigmoid colon    Diverticulosis of large intestine without diverticulitis    Benign neoplasm of ascending colon    Osteoarthritis of both hands 09/04/2017   Chronic pain of left knee 09/04/2017   Lymphedema 07/07/2016   Chronic hepatitis C without hepatic coma ([place]) 06/01/2016   Lumbar herniated disc 11/24/2015   Thrombocytopenia (HCC) 11/11/2015   Hepatitis C antibody test positive 07/13/2015   Chronic constipation 07/13/2015   CN (constipation) 07/13/2015   Diabetes mellitus with coincident hypertension ([place]) 03/08/2015   Dyslipidemia 03/08/2015   Paranoid schizophrenia ([place]) 03/08/2015   Restless leg 03/08/2015   Callus of foot 03/08/2015   Claudication ([place]) 03/08/2015   Chronic obstructive pulmonary disease ([place]) 03/08/2015   Corn or callus 03/08/2015   Essential (primary) hypertension 03/08/2015   HLD (hyperlipidemia) 03/08/2015   Peripheral vascular disease ([place]) 03/08/2015   Polyneuropathy 03/08/2015   Current tobacco use 03/08/2015   Vitamin D deficiency 04/26/2009   Avitaminosis D 04/26/2009    Past Surgical History:  Procedure Laterality Date   BACK SURGERY     CARDIOVERSION N/A 05/20/2021   Procedure: CARDIOVERSION;  Surgeon: Wellington Hampshire, MD;  Location: [facility];  Service: Cardiovascular;  Laterality: N/A;   CATARACT EXTRACTION W/PHACO Right 10/31/2021   Procedure: CATARACT EXTRACTION PHACO AND INTRAOCULAR LENS PLACEMENT ([place]) RIGHT DIABETIC;  Surgeon: Eulogio Bear, MD;  Location: [place];  Service: Ophthalmology;  Laterality: Right;  Diabetic 4.34 00:42.8   COLONOSCOPY WITH PROPOFOL N/A 09/07/2017   Procedure: COLONOSCOPY WITH PROPOFOL;  Surgeon: Virgel Manifold, MD;  Location: [facility];  Service: Endoscopy;  Laterality:  N/A;   ESOPHAGOGASTRODUODENOSCOPY (EGD) WITH PROPOFOL N/A 12/07/2017   Procedure: ESOPHAGOGASTRODUODENOSCOPY (EGD) WITH PROPOFOL;  Surgeon: Virgel Manifold, MD;  Location: [facility];  Service: Endoscopy;  Laterality: N/A;   EYE SURGERY     FLEXIBLE SIGMOIDOSCOPY N/A 11/09/2017   Procedure: FLEXIBLE SIGMOIDOSCOPY;  Surgeon: Virgel Manifold, MD;  Location: [facility];  Service: Endoscopy;  Laterality: N/A;   HERNIA REPAIR     inguinal   INGUINAL HERNIA REPAIR Bilateral 06/21/2018   Procedure: LAPAROSCOPIC BILATERAL INGUINAL HERNIA REPAIR;  Surgeon: Vickie Epley, MD;  Location: [facility];  Service: General;  Laterality: Bilateral;   L3 TO L5 LAMINECTOMY FOR DECOMPRESSION  07/17/2016   [facility]   REPAIR OF CEREBROSPINAL FLUID LEAK N/A 09/08/2016   Procedure: Lumbar wound exploration, repair of pseudomenigocele;  Surgeon: Kevan Ny Ditty, MD;  Location: [place];  Service: Neurosurgery;  Laterality: N/A;  Lumbar wound exploration, repair of pseudomenigocele   SINUSOTOMY      Family History  Problem Relation Age of Onset   Heart failure Mother    Hypertension Mother    Diverticulitis Mother    Colitis Mother    Diabetes Father    Hypertension Father    Heart disease Father    Diabetes Sister    Heart failure Brother    Diverticulitis Brother     Social History   Tobacco Use   Smoking status: Every Day    Packs/day: 1.50    Years: 46.00    Additional pack years: 0.00    Total pack years: 69.00    Types: Cigarettes   Smokeless tobacco: Never  Substance Use Topics   Alcohol use: No    Alcohol/week: 0.0 standard drinks of alcohol     Current Outpatient Medications:    apixaban (ELIQUIS) 5 MG TABS tablet, Take 1 tablet (5 mg total) by mouth 2 (two) times daily. Patient receives via [name] patient assistance through Dec 2023, Disp: 60 tablet, Rfl: 1   blood glucose meter kit and supplies KIT, Dispense based on patient and  insurance preference. Use up to tid E11.65, LON:99, Disp: 1 each, Rfl: 0   Budeson-Glycopyrrol-Formoterol (BREZTRI AEROSPHERE) 160-9-4.8 MCG/ACT AERO, Inhale 2 puffs into the lungs 2 (two) times daily. Patient receives via [employer] Patient Assistance., Disp: 32.1 g, Rfl: 3   diltiazem (DILT-XR) 240 MG 24 hr capsule, Take 1 capsule (240 mg total) by mouth daily., Disp: 90 capsule, Rfl: 1   gabapentin (NEURONTIN) 300 MG capsule, Take 300-600 mg by mouth See admin instructions. Take 1 capsule (300 mg) by mouth in the morning, take 1 capsule (300 mg) by mouth at noon, and 2 capsules (600 mg) by mouth in the evening, Disp: , Rfl:    Lancet Devices MISC, 1 each by Does not apply route 3 (three) times daily as needed. What ins will cover dx:E11.65, LON:99 tid, Disp: 100 each, Rfl: 12   LEVEMIR 100 UNIT/ML injection, INJECT 8 UNITS SUBCUTANEOUSLY AT BEDTIME, Disp: 10 mL, Rfl: 0   potassium chloride (KLOR-CON) 10 MEQ tablet, Take 1 tablet (10 mEq total) by mouth every Monday, Wednesday, and Friday., Disp: 36 tablet, Rfl: 0   pravastatin (PRAVACHOL) 20 MG tablet, Take 1 tablet (20 mg total) by mouth daily with supper., Disp: 90 tablet, Rfl: 3   tiZANidine (ZANAFLEX) 4 MG tablet, Take 4 mg by mouth every 12 (twelve) hours., Disp: , Rfl:    traMADol (ULTRAM) 50 MG tablet, Take 50 mg by mouth every 12 (twelve) hours., Disp: , Rfl:    Vitamin D, Ergocalciferol, (DRISDOL) 1.25 MG (50000 UNIT) CAPS capsule, TAKE 1 CAPSULE BY MOUTH EVERY TWO WEEKS, Disp: 12 capsule, Rfl: 0  Allergies  Allergen Reactions   Penicillins Hives and Swelling    As a child. Has patient had a PCN reaction causing immediate rash, facial/tongue/throat swelling, SOB or lightheadedness with hypotension: YES  Has patient had a PCN reaction causing severe rash involving mucus membranes or skin necrosis: UNKNOWN Has patient had a PCN reaction that required hospitalization: UNKNOWN Has patient had a PCN reaction occurring within the last 10 years:  NO   Aspirin Nausea Only   Ibuprofen Nausea Only   Lyrica [Pregabalin] Nausea Only   Acetaminophen Nausea Only    I personally reviewed active problem list, medication list, allergies, family history, social history, health maintenance with the patient/caregiver today.   ROS  Ten  systems reviewed and is negative except as mentioned in HPI   Objective  Vitals:   11/15/22 1408  BP: 132/74  Pulse: 73  Resp: 16  SpO2: 95%  Weight: 156 lb (70.8 kg)  Height: 5\' 11"  (1.803 m)    Body mass index is 21.76 kg/m.  Physical Exam  Constitutional: Patient appears well-developed and well-nourished. No distress.  HEENT: head atraumatic, normocephalic, pupils equal and reactive to light, neck supple Cardiovascular: Normal rate, regular rhythm and normal heart sounds.  No murmur heard. No BLE edema, he has some ulceration on right lower extremity , no signs of infection at this time, see attached picture. Pulmonary/Chest: Rhonchi. No respiratory distress. Abdominal: Soft.  There is no tenderness. Psychiatric: Patient has a normal mood and affect. behavior is normal. Judgment and thought content normal.   Recent Results (from the past 2160 hour(s))  Basic metabolic panel     Status: Abnormal   Collection Time: 08/23/22  2:31 PM  Result Value Ref Range   Sodium 140 135 - 145 mmol/L   Potassium 3.4 (L) 3.5 - 5.1 mmol/L   Chloride 105 98 - 111 mmol/L   CO2 30 22 - 32 mmol/L   Glucose, Bld 230 (H) 70 - 99 mg/dL    Comment: Glucose reference range applies only to samples taken after fasting for at least 8 hours.   BUN 11 8 - 23 mg/dL   Creatinine, Ser 0.70 0.61 - 1.24 mg/dL   Calcium 8.8 (L) 8.9 - 10.3 mg/dL   GFR, Estimated >60 >60 mL/min    Comment: (NOTE) Calculated using the CKD-EPI Creatinine Equation (2021)    Anion gap 5 5 - 15    Comment: Performed at [facility], [address] [place], [place] [id number]  Basic metabolic panel     Status: Abnormal   Collection  Time: 09/01/22  2:15 PM  Result Value Ref Range   Sodium 135 135 - 145 mmol/L   Potassium 3.8 3.5 - 5.1 mmol/L   Chloride 102 98 - 111 mmol/L   CO2 28 22 - 32 mmol/L   Glucose, Bld 280 (H) 70 - 99 mg/dL    Comment: Glucose reference range applies only to samples taken after fasting for at least 8 hours.   BUN 14 8 - 23 mg/dL   Creatinine, Ser 0.86 0.61 - 1.24 mg/dL   Calcium 8.7 (L) 8.9 - 10.3 mg/dL   GFR, Estimated >60 >60 mL/min    Comment: (NOTE) Calculated using the CKD-EPI Creatinine Equation (2021)    Anion gap 5 5 - 15    Comment: Performed at [facility], [address] [place],  [address]  ECHOCARDIOGRAM COMPLETE     Status: None   Collection Time: 09/11/22  3:59 PM  Result Value Ref Range   AR max vel 3.47 cm2   AV Peak grad 1.9 mmHg   Ao pk vel 0.69 m/s   S' Lateral 2.90 cm   Area-P 1/2 3.60 cm2   AV Area VTI 3.79 cm2   AV Mean grad 1.0 mmHg   AV Area mean vel 3.39 cm2   Est EF 45 - 50%   POCT HgB A1C     Status: Abnormal   Collection Time: 11/15/22  2:13 PM  Result Value Ref Range   Hemoglobin A1C 7.4 (A) 4.0 - 5.6 %   HbA1c POC (<> result, manual entry)     HbA1c, POC (prediabetic range)     HbA1c, POC (controlled  diabetic range)      PHQ2/9:    11/15/2022    2:08 PM 09/29/2022    1:09 PM 07/17/2022    1:31 PM 06/06/2022   11:33 AM 03/15/2022    2:15 PM  Depression screen PHQ 2/9  Decreased Interest 0 0 0 0 0  Down, Depressed, Hopeless 0 0 0 0 0  PHQ - 2 Score 0 0 0 0 0  Altered sleeping 0  0 0   Tired, decreased energy 3  0 0   Change in appetite 0  0 0   Feeling bad or failure about yourself  0  0 0   Trouble concentrating 0  0 0   Moving slowly or fidgety/restless 0  0 0   Suicidal thoughts 0  0 0   PHQ-9 Score 3  0 0     phq 9 is negative   Fall Risk:    11/15/2022    2:08 PM 09/29/2022    1:04 PM 07/17/2022    1:31 PM 06/06/2022   11:33 AM 03/15/2022    2:15 PM  Fall Risk   Falls in the past year? 0 0 0 0 0  Number  falls in past yr: 0 0 0 0 0  Injury with Fall? 0 0 0 0 0  Risk for fall due to : No Fall Risks No Fall Risks No Fall Risks No Fall Risks No Fall Risks  Follow up Falls prevention discussed Education provided;Falls prevention discussed Falls prevention discussed Falls prevention discussed Falls prevention discussed      Functional Status Survey: Is the patient deaf or have difficulty hearing?: No Does the patient have difficulty seeing, even when wearing glasses/contacts?: No Does the patient have difficulty concentrating, remembering, or making decisions?: No Does the patient have difficulty walking or climbing stairs?: Yes Does the patient have difficulty dressing or bathing?: No Does the patient have difficulty doing errands alone such as visiting a doctor's office or shopping?: No    Assessment & Plan  1. Type 2 diabetes mellitus with diabetic polyneuropathy, with long-term current use of insulin (HCC)  - POCT HgB A1C - Urine Microalbumin w/creat. ratio - Lipid panel - COMPLETE METABOLIC PANEL WITH GFR - pioglitazone (ACTOS) 15 MG tablet; Take 1 tablet (15 mg total) by mouth daily.  Dispense: 90 tablet; Refill: 1  2. Senile purpura (HCC)  stable  3. Claudication ([place])  stable  4. Peripheral vascular disease ([place])  - Lipid panel - CBC with Differential/Platelet  5. Chronic a-fib (HCC)  Rate controlled   6. COPD, moderate ([place])   7. Vitamin D deficiency  - VITAMIN D 25 Hydroxy (Vit-D Deficiency, Fractures)  8. Open wound of right lower extremity, initial encounter  Advised to keep it clean with mild soap and water, use gauze with vaseline and water No tape only ace bandage over the area  Monitor for infection and call back if no improvement

## 2022-11-15 ENCOUNTER — Other Ambulatory Visit: Payer: Self-pay | Admitting: Family Medicine

## 2022-11-15 ENCOUNTER — Ambulatory Visit (INDEPENDENT_AMBULATORY_CARE_PROVIDER_SITE_OTHER): Payer: PPO | Admitting: Family Medicine

## 2022-11-15 ENCOUNTER — Encounter: Payer: Self-pay | Admitting: Family Medicine

## 2022-11-15 VITALS — BP 132/74 | HR 73 | Resp 16 | Ht 71.0 in | Wt 156.0 lb

## 2022-11-15 DIAGNOSIS — I482 Chronic atrial fibrillation, unspecified: Secondary | ICD-10-CM

## 2022-11-15 DIAGNOSIS — S81801A Unspecified open wound, right lower leg, initial encounter: Secondary | ICD-10-CM | POA: Diagnosis not present

## 2022-11-15 DIAGNOSIS — E1142 Type 2 diabetes mellitus with diabetic polyneuropathy: Secondary | ICD-10-CM | POA: Diagnosis not present

## 2022-11-15 DIAGNOSIS — J449 Chronic obstructive pulmonary disease, unspecified: Secondary | ICD-10-CM

## 2022-11-15 DIAGNOSIS — Z794 Long term (current) use of insulin: Secondary | ICD-10-CM

## 2022-11-15 DIAGNOSIS — D692 Other nonthrombocytopenic purpura: Secondary | ICD-10-CM | POA: Insufficient documentation

## 2022-11-15 DIAGNOSIS — E559 Vitamin D deficiency, unspecified: Secondary | ICD-10-CM | POA: Diagnosis not present

## 2022-11-15 DIAGNOSIS — I739 Peripheral vascular disease, unspecified: Secondary | ICD-10-CM | POA: Diagnosis not present

## 2022-11-15 LAB — POCT GLYCOSYLATED HEMOGLOBIN (HGB A1C): Hemoglobin A1C: 7.4 % — AB (ref 4.0–5.6)

## 2022-11-15 MED ORDER — PIOGLITAZONE HCL 15 MG PO TABS: 15.0000 mg | ORAL_TABLET | Freq: Every day | ORAL | 1 refills | Status: DC

## 2022-11-15 MED ORDER — VITAMIN D (ERGOCALCIFEROL) 1.25 MG (50000 UNIT) PO CAPS: 50000.0000 [IU] | ORAL_CAPSULE | ORAL | 1 refills | Status: DC

## 2022-11-16 LAB — COMPLETE METABOLIC PANEL WITHOUT GFR
AG Ratio: 1.9 (calc) (ref 1.0–2.5)
ALT: 12 U/L (ref 9–46)
AST: 13 U/L (ref 10–35)
Albumin: 3.9 g/dL (ref 3.6–5.1)
Alkaline phosphatase (APISO): 104 U/L (ref 35–144)
BUN: 9 mg/dL (ref 7–25)
CO2: 28 mmol/L (ref 20–32)
Calcium: 8.9 mg/dL (ref 8.6–10.3)
Chloride: 105 mmol/L (ref 98–110)
Creat: 0.8 mg/dL (ref 0.70–1.35)
Globulin: 2.1 g/dL (ref 1.9–3.7)
Glucose, Bld: 106 mg/dL — ABNORMAL HIGH (ref 65–99)
Potassium: 3.7 mmol/L (ref 3.5–5.3)
Sodium: 143 mmol/L (ref 135–146)
Total Bilirubin: 1 mg/dL (ref 0.2–1.2)
Total Protein: 6 g/dL — ABNORMAL LOW (ref 6.1–8.1)
eGFR: 97 mL/min/1.73m2

## 2022-11-16 LAB — CBC WITH DIFFERENTIAL/PLATELET
Absolute Monocytes: 574 {cells}/uL (ref 200–950)
Basophils Absolute: 28 {cells}/uL (ref 0–200)
Basophils Relative: 0.4 %
Eosinophils Absolute: 63 {cells}/uL (ref 15–500)
Eosinophils Relative: 0.9 %
HCT: 40.3 % (ref 38.5–50.0)
Hemoglobin: 13.9 g/dL (ref 13.2–17.1)
Lymphs Abs: 1729 {cells}/uL (ref 850–3900)
MCH: 31.1 pg (ref 27.0–33.0)
MCHC: 34.5 g/dL (ref 32.0–36.0)
MCV: 90.2 fL (ref 80.0–100.0)
MPV: 10 fL (ref 7.5–12.5)
Monocytes Relative: 8.2 %
Neutro Abs: 4606 {cells}/uL (ref 1500–7800)
Neutrophils Relative %: 65.8 %
Platelets: 177 Thousand/uL (ref 140–400)
RBC: 4.47 Million/uL (ref 4.20–5.80)
RDW: 12.7 % (ref 11.0–15.0)
Total Lymphocyte: 24.7 %
WBC: 7 Thousand/uL (ref 3.8–10.8)

## 2022-11-16 LAB — LIPID PANEL
Cholesterol: 103 mg/dL
HDL: 61 mg/dL
LDL Cholesterol (Calc): 29 mg/dL
Non-HDL Cholesterol (Calc): 42 mg/dL
Total CHOL/HDL Ratio: 1.7 (calc)
Triglycerides: 46 mg/dL

## 2022-11-16 LAB — MICROALBUMIN / CREATININE URINE RATIO
Creatinine, Urine: 117 mg/dL (ref 20–320)
Microalb Creat Ratio: 84 mg/g{creat} — ABNORMAL HIGH
Microalb, Ur: 9.8 mg/dL

## 2022-11-16 LAB — VITAMIN D 25 HYDROXY (VIT D DEFICIENCY, FRACTURES): Vit D, 25-Hydroxy: 66 ng/mL (ref 30–100)

## 2022-11-22 DIAGNOSIS — F209 Schizophrenia, unspecified: Secondary | ICD-10-CM | POA: Diagnosis not present

## 2022-11-22 DIAGNOSIS — G8929 Other chronic pain: Secondary | ICD-10-CM | POA: Diagnosis not present

## 2022-11-22 DIAGNOSIS — Z79891 Long term (current) use of opiate analgesic: Secondary | ICD-10-CM | POA: Diagnosis not present

## 2022-11-22 DIAGNOSIS — F1120 Opioid dependence, uncomplicated: Secondary | ICD-10-CM | POA: Diagnosis not present

## 2022-11-22 DIAGNOSIS — M5430 Sciatica, unspecified side: Secondary | ICD-10-CM | POA: Diagnosis not present

## 2022-11-22 DIAGNOSIS — I10 Essential (primary) hypertension: Secondary | ICD-10-CM | POA: Diagnosis not present

## 2022-11-22 DIAGNOSIS — Z716 Tobacco abuse counseling: Secondary | ICD-10-CM | POA: Diagnosis not present

## 2022-11-22 DIAGNOSIS — M545 Low back pain, unspecified: Secondary | ICD-10-CM | POA: Diagnosis not present

## 2022-11-22 DIAGNOSIS — E119 Type 2 diabetes mellitus without complications: Secondary | ICD-10-CM | POA: Diagnosis not present

## 2022-11-22 DIAGNOSIS — Z720 Tobacco use: Secondary | ICD-10-CM | POA: Diagnosis not present

## 2022-11-22 DIAGNOSIS — M961 Postlaminectomy syndrome, not elsewhere classified: Secondary | ICD-10-CM | POA: Diagnosis not present

## 2022-11-24 ENCOUNTER — Ambulatory Visit: Payer: PPO | Attending: Cardiology | Admitting: Cardiology

## 2022-11-24 ENCOUNTER — Encounter: Payer: Self-pay | Admitting: Cardiology

## 2022-11-24 VITALS — BP 130/70 | HR 80 | Ht 71.0 in | Wt 159.8 lb

## 2022-11-24 DIAGNOSIS — E1151 Type 2 diabetes mellitus with diabetic peripheral angiopathy without gangrene: Secondary | ICD-10-CM

## 2022-11-24 DIAGNOSIS — I4819 Other persistent atrial fibrillation: Secondary | ICD-10-CM

## 2022-11-24 DIAGNOSIS — R0602 Shortness of breath: Secondary | ICD-10-CM | POA: Diagnosis not present

## 2022-11-24 DIAGNOSIS — I5020 Unspecified systolic (congestive) heart failure: Secondary | ICD-10-CM

## 2022-11-24 DIAGNOSIS — R079 Chest pain, unspecified: Secondary | ICD-10-CM | POA: Diagnosis not present

## 2022-11-24 DIAGNOSIS — Z720 Tobacco use: Secondary | ICD-10-CM | POA: Diagnosis not present

## 2022-11-24 DIAGNOSIS — I10 Essential (primary) hypertension: Secondary | ICD-10-CM

## 2022-11-24 DIAGNOSIS — I2089 Other forms of angina pectoris: Secondary | ICD-10-CM

## 2022-11-24 DIAGNOSIS — E782 Mixed hyperlipidemia: Secondary | ICD-10-CM | POA: Diagnosis not present

## 2022-11-24 MED ORDER — LOSARTAN POTASSIUM 25 MG PO TABS: 12.5000 mg | ORAL_TABLET | Freq: Every day | ORAL | 0 refills | Status: DC

## 2022-11-24 NOTE — Progress Notes (Signed)
Cardiology Office Note:   Date:  11/24/2022  ID:  MAXSTON KASIM, DOB 1955/12/29, MRN [id number]  History of Present Illness:   DONAHUE MONDOR is a 67 y.o. male with past medical history of hypertension, type 2 diabetes, PAD, COPD, schizoaffective/schizophrenia disorder, tobacco use, presents in atrial fibrillation on chronic anticoagulation with rivaroxaban, who is here today for follow-up.  He was last seen in clinic 08/23/2022 and at that time he had noticed over a few weeks he presents pains and increased amount of dyspnea on exertion primarily walking up stairs.  He has not been changed and he thought that it was helping some.  Unfortunately continues to smoke 1 to 1.5 pack for cigarettes per day.  He denies any other symptoms of chest pain, palpitations or peripheral edema.  He was sent for blood work continued on his current medication regimen without changes needed to be made at that time.  He was also scheduled for an echocardiogram.  He returns to clinic today with complaints of worsening shortness of breath and dyspnea on exertion primarily when walking upstairs.  He denies any bleeding or increased amount of bruising as he has been continued on rivaroxaban.  His inhalers were recently changed and initially they were improving his shortness of breath but states he does not believe.  He also continues to smoke 1 to 1.5 pack/cigarettes/day.  Recently had medication changes made from his PCP due to his diabetes.  He denies any chest pain, palpitations, or claudication symptoms today.  ROS: worsening shortness of breath, peripheral edema, the remainder of the 10 point review of systems is negative.  Studies Reviewed:    EKG: Atrial flutter with variable AV block PVCs/aberrancy, left axis deviation, rate of 80  TTE 09/11/22 1. Left ventricular ejection fraction, by estimation, is 45 to 50%. The  left ventricle has mildly decreased function. The left ventricle  demonstrates global  hypokinesis. The left ventricular internal cavity size  was mildly dilated. Left ventricular  diastolic parameters are indeterminate.   2. Right ventricular systolic function is normal. The right ventricular  size is not well visualized.   3. Left atrial size was mildly dilated.   4. Right atrial size was mildly dilated.   5. The mitral valve is grossly normal. No evidence of mitral valve  regurgitation.   6. The aortic valve is calcified. Aortic valve regurgitation is not  visualized.   7. The inferior vena cava is dilated in size with <50% respiratory  variability, suggesting right atrial pressure of 15 mmHg.   8. Rhythm strip during this exam demonstrates atrial flutter.   Risk Assessment/Calculations:    CHA2DS2-VASc Score = 4   This indicates a 4.8% annual risk of stroke. The patient's score is based upon: CHF History: 0 HTN History: 1 Diabetes History: 1 Stroke History: 0 Vascular Disease History: 1 Age Score: 1 Gender Score: 0             Physical Exam:   VS:  BP 130/70 (BP Location: Left Arm, Patient Position: Sitting, Cuff Size: Normal)   Pulse 80   Ht 5\' 11"  (1.803 m)   Wt 159 lb 12.8 oz (72.5 kg)   SpO2 96%   BMI 22.29 kg/m    Wt Readings from Last 3 Encounters:  11/24/22 159 lb 12.8 oz (72.5 kg)  11/15/22 156 lb (70.8 kg)  09/29/22 161 lb (73 kg)     GEN: Well nourished, well developed in no acute distress NECK: No JVD; No  carotid bruits CARDIAC: Irregularly irregular, no murmurs, rubs, gallops RESPIRATORY:  Clear to auscultation without rales, wheezing or rhonchi  ABDOMEN: Soft, non-tender, non-distended EXTREMITIES:  1+ edema to BLE; No deformity   ASSESSMENT AND PLAN:   Cardiomyopathy/HFrEF with worsening shortness of breath and dyspnea on exertion noted on recent echocardiogram with LVEF dropped from 55-60% in 06/2019 45-50% in 08/2022.  He is having peripheral edema as well. With continued SOB and DOE in a diabetic, he is being scheduled for a  Lexiscan myoview for anginal equivalents.  He has been started on losartan 12.5 mg daily today with likely change to [facility] and addition of MRA.  Will continue to slowly escalate GDMT as tolerated. (Of note does currently receives assistance for Eliquis) Will have a BMP in 2 weeks.  Persistent atrial fibrillation/atrial flutter continued today with atrial flutter with varying AV block noted on EKG today.  Patient has previously been evaluated by EP for catheter ablation and was not felt to be a candidate.  He has been continued on apixaban 5 mg daily twice daily for CHA2DS2-VASc score of at least 4.  As well as diltiazem 240 mg daily for rate control.   Hypertension with blood pressure today 130/70.  Is stable.  We are can initiate 12.5 mg of losartan today with likely changing to [facility] and starting spironolactone return.  Hyperlipidemia with an LDL of 29.  Patient remains at goal.  He is continued on pravastatin.  This continues to be monitored by his PCP.  Type 2 diabetes with a last hemoglobin A1c of 7.4.  He has been continued on his diabetic medications as well as this is followed by his PCP.  Tobacco abuse continues to smoke 1.1 0.5 packs of cigarettes daily for the past 40 years.  Total cessation is recommended.  Patient is not ready to quit.  Disposition patient return to clinic after stress testing is completed or sooner if needed to follow-up with MD or APP.        Signed, Danish Ruffins, NP

## 2022-11-24 NOTE — Patient Instructions (Addendum)
Medication Instructions:  Your physician has recommended you make the following change in your medication:   START Losartan 12.5 mg tablet by mouth daily.  *If you need a refill on your cardiac medications before your next appointment, please call your pharmacy*   Lab Work:  Your physician recommends that you return for lab work in: 2 weeks at [facility].   Monday- Friday 7:00 am- 6:00 pm.  BMP   If you have labs (blood work) drawn today and your tests are completely normal, you will receive your results only by: [place] (if you have MyChart) OR A paper copy in the mail If you have any lab test that is abnormal or we need to change your treatment, we will call you to review the results.   Testing/Procedures:  [place]  Your caregiver has ordered a Stress Test with nuclear imaging. The purpose of this test is to evaluate the blood supply to your heart muscle. This procedure is referred to as a "Non-Invasive Stress Test." This is because other than having an IV started in your vein, nothing is inserted or "invades" your body. Cardiac stress tests are done to find areas of poor blood flow to the heart by determining the extent of coronary artery disease (CAD). Some patients exercise on a treadmill, which naturally increases the blood flow to your heart, while others who are  unable to walk on a treadmill due to physical limitations have a pharmacologic/chemical stress agent called Lexiscan . This medicine will mimic walking on a treadmill by temporarily increasing your coronary blood flow.   Please note: these test may take anywhere between 2-4 hours to complete  PLEASE REPORT TO [place] AT THE FIRST DESK WILL DIRECT YOU WHERE TO GO  Date of Procedure:_____________________________________  Arrival Time for Procedure:______________________________  Instructions regarding medication:   _x___ : Hold diabetes medication morning  of procedure  ACTOS   PLEASE NOTIFY THE OFFICE AT LEAST 24 HOURS IN ADVANCE IF YOU ARE UNABLE TO KEEP YOUR APPOINTMENT.  [phone] AND  PLEASE NOTIFY NUCLEAR MEDICINE AT [facility] AT LEAST 24 HOURS IN ADVANCE IF YOU ARE UNABLE TO KEEP YOUR APPOINTMENT. [phone]  How to prepare for your Myoview test:  Do not eat or drink after midnight No caffeine for 24 hours prior to test No smoking 24 hours prior to test. Your medication may be taken with water.  If your doctor stopped a medication because of this test, do not take that medication. Pants are appropriate. Please wear a short sleeve shirt. No perfume, cologne or lotion. Wear comfortable walking shoes. No heels!     Follow-Up: At [facility], you and your health needs are our priority.  As part of our continuing mission to provide you with exceptional heart care, we have created designated Provider Care Teams.  These Care Teams include your primary Cardiologist (physician) and Advanced Practice Providers (APPs -  Physician Assistants and Nurse Practitioners) who all work together to provide you with the care you need, when you need it.  We recommend signing up for the patient portal called "MyChart".  Sign up information is provided on this After Visit Summary.  MyChart is used to connect with patients for Virtual Visits (Telemedicine).  Patients are able to view lab/test results, encounter notes, upcoming appointments, etc.  Non-urgent messages can be sent to your provider as well.   To learn more about what you can do with MyChart, go to [email or web address].  Your next appointment:     Follow up after testing   Provider:   You may see Ida Rogue, MD or one of the following Advanced Practice Providers on your designated Care Team:   Murray Hodgkins, NP Christell Faith, PA-C Cadence Kathlen Mody, PA-C Gerrie Nordmann, NP

## 2022-11-27 ENCOUNTER — Other Ambulatory Visit: Payer: Self-pay | Admitting: Cardiovascular Disease

## 2022-11-27 DIAGNOSIS — I482 Chronic atrial fibrillation, unspecified: Secondary | ICD-10-CM

## 2022-11-27 NOTE — Telephone Encounter (Signed)
Eliquis 5mg  refill request received. Patient is 67 years old, weight-72.5kg, Crea-0.80 on 11/15/22, Diagnosis-Afib, and last seen by Gerrie Nordmann on 11/24/22. Dose is appropriate based on dosing criteria. Will send in refill to requested pharmacy.

## 2022-11-29 ENCOUNTER — Ambulatory Visit: Payer: PPO

## 2022-11-29 ENCOUNTER — Telehealth: Payer: Self-pay

## 2022-11-29 DIAGNOSIS — Z794 Long term (current) use of insulin: Secondary | ICD-10-CM

## 2022-11-29 DIAGNOSIS — I10 Essential (primary) hypertension: Secondary | ICD-10-CM

## 2022-11-29 MED ORDER — DAPAGLIFLOZIN PROPANEDIOL 10 MG PO TABS: 10.0000 mg | ORAL_TABLET | Freq: Every day | ORAL | 0 refills | Status: DC

## 2022-11-29 NOTE — Progress Notes (Signed)
Care Coordination Pharmacy Assistant   Name: [name]  MRN: [id number] DOB: 20-Nov-1955  Reason for Encounter: Medication Coordination/PAP for Mark Nash   I received a task from [name], CPP requesting that I contact the patient to inform him to stop pioglitazone 15 mg tablet and start Farxiga 10 mg daily tomorrow. CPP called in a 30-Day supply of Farxiga to patient's local [facility] and provided me with a voucher to give [facility] so the patient can get a one month free supply.  I contacted patient and instructed him to stop taking Pioglitazone and informed him that CPP would like him to start Farxiga 10 mg daily before breakfast and he can start this medication tomorrow. I informed patient that we sent in a 30-Day supply to [facility] and that the voucher would cover the cost. I also informed the patient that I would apply for patient assistance on his behalf to see if he can start receiving this medication for free. Patient verbalized understanding to all and is agreeable to have me apply for patient assistance for him via the [employer] website.  Application started for patient assistance via the AZ[employer]ME Website. Patient has been approved and enrolled into the program starting 11/29/2022 ending 08/28/2023. CPP has been notified that patient was approved and request for him to send prescription over to [employer] to process for shipment.  Patient informed of status as well. Patient instructed to call me immediately if he starts to have any side effects after starting [place].  Medications: Outpatient Encounter Medications as of 11/29/2022  Medication Sig   apixaban (ELIQUIS) 5 MG TABS tablet Take 1 tablet by mouth twice daily   blood glucose meter kit and supplies KIT Dispense based on patient and insurance preference. Use up to tid E11.65, LON:99   Budeson-Glycopyrrol-Formoterol (BREZTRI AEROSPHERE) 160-9-4.8 MCG/ACT AERO Inhale 2 puffs into the lungs 2 (two) times daily. Patient receives via [employer]ME Patient  Assistance.   dapagliflozin propanediol (FARXIGA) 10 MG TABS tablet Take 1 tablet (10 mg total) by mouth daily.   diltiazem (DILT-XR) 240 MG 24 hr capsule Take 1 capsule (240 mg total) by mouth daily.   gabapentin (NEURONTIN) 300 MG capsule Take 300-600 mg by mouth See admin instructions. Take 1 capsule (300 mg) by mouth in the morning, take 1 capsule (300 mg) by mouth at noon, and 2 capsules (600 mg) by mouth in the evening   guaiFENesin (MUCINEX) 600 MG 12 hr tablet Take 600 mg by mouth 2 (two) times daily as needed for to loosen phlegm.   insulin detemir (LEVEMIR) 100 UNIT/ML injection Inject 8 Units into the skin at bedtime.   Lancet Devices MISC 1 each by Does not apply route 3 (three) times daily as needed. What ins will cover dx:E11.65, LON:99 tid   losartan (COZAAR) 25 MG tablet Take 0.5 tablets (12.5 mg total) by mouth daily.   potassium chloride (KLOR-CON) 10 MEQ tablet Take 1 tablet (10 mEq total) by mouth every Monday, Wednesday, and Friday.   pravastatin (PRAVACHOL) 20 MG tablet Take 1 tablet (20 mg total) by mouth daily with supper.   tiZANidine (ZANAFLEX) 4 MG tablet Take 4 mg by mouth every 12 (twelve) hours.   traMADol (ULTRAM) 50 MG tablet Take 50 mg by mouth every 12 (twelve) hours.   Vitamin D, Ergocalciferol, (DRISDOL) 1.25 MG (50000 UNIT) CAPS capsule Take 1 capsule (50,000 Units total) by mouth every 14 (fourteen) days.   No facility-administered encounter medications on file as of 11/29/2022.   Lynann Bologna,  CPA/CMA Clinical Pharmacist Assistant Phone: [phone]

## 2022-11-29 NOTE — Progress Notes (Unsigned)
Chronic Care Management Pharmacy Note  11/29/2022 Name:  Mark Nash MRN:  [id number] DOB:  [birth year]  Summary: Patient presents for CCM follow-up. Patient is now receiving Breztri and Eliquis through patient assistance.   Recommendations/Changes made from today's visit: Continue current medications.  Plan: CPP follow-up in 3 months  Subjective: Mark Nash is an 67 y.o. year old male who is a primary patient of [name], MD.  The CCM team was consulted for assistance with disease management and care coordination needs.    Engaged with patient by telephone for follow up visit in response to provider referral for pharmacy case management and/or care coordination services.   Consent to Services:  The patient was given information about Chronic Care Management services, agreed to services, and gave verbal consent prior to initiation of services.  Please see initial visit note for detailed documentation.   Patient Care Team: [name], MD as PCP - General (Family Medicine) Vickie Epley, MD as PCP - Electrophysiology (Cardiology) Minna Merritts, MD as PCP - Cardiology (Cardiology) Virgel Manifold, MD (Inactive) as Consulting Physician (Gastroenterology) Leim Fabry, MD as Consulting Physician (Orthopedic Surgery) Caroline More, DPM as Consulting Physician (Podiatry) Nathanial Rancher, MD as Referring Physician (Pain Medicine) End, Harrell Gave, MD as Consulting Physician (Cardiology) Germaine Pomfret, [facility] (Pharmacist) Valente David, RN as [place] Management  Recent office visits: 11/15/22: Patient presented to Dr. Ancil Boozer for follow-up. Pioglitazone 15 mg daily.   Recent consult visits: 11/24/22: Patient presented to Gerrie Nordmann, NP (Cardiology). Losartan 12.5 mg daily   Hospital visits: None in past 6 months.   Objective:  Lab Results  Component Value Date   CREATININE 0.80 11/15/2022   BUN 9 11/15/2022   GFRNONAA >60  09/01/2022   GFRAA 116 09/15/2020   NA 143 11/15/2022   K 3.7 11/15/2022   CALCIUM 8.9 11/15/2022   CO2 28 11/15/2022   GLUCOSE 106 (H) 11/15/2022    Lab Results  Component Value Date/Time   HGBA1C 7.4 (A) 11/15/2022 02:13 PM   HGBA1C 7.6 (A) 07/17/2022 01:33 PM   HGBA1C 8.8 (H) 11/11/2021 03:12 PM   HGBA1C 7.1 (H) 11/10/2019 02:27 PM   HGBA1C 6.6 07/11/2019 01:45 PM   HGBA1C 6.8 03/10/2019 10:08 AM   MICROALBUR 9.8 11/15/2022 02:48 PM   MICROALBUR 2.4 11/11/2021 03:12 PM   MICROALBUR 50 07/17/2017 02:17 PM   MICROALBUR negative 03/11/2015 10:59 AM    Last diabetic Eye exam:  Lab Results  Component Value Date/Time   HMDIABEYEEXA No Retinopathy 08/31/2021 12:00 AM    Last diabetic Foot exam:  Lab Results  Component Value Date/Time   HMDIABFOOTEX Normal 04/19/2022 12:00 AM     Lab Results  Component Value Date   CHOL 103 11/15/2022   HDL 61 11/15/2022   LDLCALC 29 11/15/2022   TRIG 46 11/15/2022   CHOLHDL 1.7 11/15/2022       Latest Ref Rng & Units 11/15/2022    2:48 PM 11/11/2021    3:12 PM 09/15/2020    3:00 PM  Hepatic Function  Total Protein 6.1 - 8.1 g/dL 6.0  6.1  6.5   AST 10 - 35 U/L 13  10  17    ALT 9 - 46 U/L 12  10  14    Total Bilirubin 0.2 - 1.2 mg/dL 1.0  0.8  1.2     Lab Results  Component Value Date/Time   TSH 1.706 04/19/2021 10:04 PM       Latest  Ref Rng & Units 11/15/2022    2:48 PM 08/18/2021    2:17 PM 05/25/2021   12:16 PM  CBC  WBC 3.8 - 10.8 Thousand/uL 7.0  6.7  6.7   Hemoglobin 13.2 - 17.1 g/dL 13.9  14.9  15.2   Hematocrit 38.5 - 50.0 % 40.3  43.9  43.3   Platelets 140 - 400 Thousand/uL 177  184  167     Lab Results  Component Value Date/Time   VD25OH 66 11/15/2022 02:48 PM   VD25OH 26 (L) 03/30/2017 12:04 PM    Clinical ASCVD: No  The ASCVD Risk score (Arnett DK, et al., 2019) failed to calculate for the following reasons:   The valid total cholesterol range is 130 to 320 mg/dL       11/15/2022    2:08 PM 09/29/2022     1:09 PM 07/17/2022    1:31 PM  Depression screen PHQ 2/9  Decreased Interest 0 0 0  Down, Depressed, Hopeless 0 0 0  PHQ - 2 Score 0 0 0  Altered sleeping 0  0  Tired, decreased energy 3  0  Change in appetite 0  0  Feeling bad or failure about yourself  0  0  Trouble concentrating 0  0  Moving slowly or fidgety/restless 0  0  Suicidal thoughts 0  0  PHQ-9 Score 3  0    Social History   Tobacco Use  Smoking Status Every Day   Packs/day: 1.50   Years: 46.00   Additional pack years: 0.00   Total pack years: 69.00   Types: Cigarettes  Smokeless Tobacco Never   BP Readings from Last 3 Encounters:  11/24/22 130/70  11/15/22 132/74  08/23/22 (!) 140/80   Pulse Readings from Last 3 Encounters:  11/24/22 80  11/15/22 73  08/23/22 70   Wt Readings from Last 3 Encounters:  11/24/22 159 lb 12.8 oz (72.5 kg)  11/15/22 156 lb (70.8 kg)  09/29/22 161 lb (73 kg)   BMI Readings from Last 3 Encounters:  11/24/22 22.29 kg/m  11/15/22 21.76 kg/m  09/29/22 22.45 kg/m    Assessment/Interventions: Review of patient past medical history, allergies, medications, health status, including review of consultants reports, laboratory and other test data, was performed as part of comprehensive evaluation and provision of chronic care management services.   SDOH:  (Social Determinants of Health) assessments and interventions performed: Yes SDOH Interventions    Flowsheet Row Telephone from 10/19/2022 in [place] from 09/29/2022 in [facility] Telephone from 03/20/2022 in [place] Management from 09/28/2021 in [place] Management from 08/03/2021 in [place] Management from 07/20/2021 in [facility]  SDOH Interventions        Food Insecurity  Interventions Intervention Not Indicated Intervention Not Indicated -- -- -- --  Housing Interventions Intervention Not Indicated Intervention Not Indicated -- -- -- --  Transportation Interventions Intervention Not Indicated Intervention Not Indicated Intervention Not Indicated -- -- --  Utilities Interventions -- Intervention Not Indicated -- -- -- --  Alcohol Usage Interventions -- Intervention Not Indicated (Score <7) -- -- -- --  Financial Strain Interventions -- Intervention Not Indicated -- Other (Comment)  [PAP] Other (Comment)  [PAP] Other (Comment)  [PAP]  Physical Activity Interventions -- Intervention Not Indicated -- -- -- --  Stress Interventions -- Intervention Not  Indicated -- -- -- --  Social Connections Interventions -- Intervention Not Indicated -- -- -- --        SDOH Screenings   Food Insecurity: No Food Insecurity (10/19/2022)  Housing: Low Risk  (10/19/2022)  Transportation Needs: No Transportation Needs (10/19/2022)  Utilities: Not At Risk (09/29/2022)  Alcohol Screen: Low Risk  (09/29/2022)  Depression (PHQ2-9): Low Risk  (11/15/2022)  Financial Resource Strain: Low Risk  (09/29/2022)  Physical Activity: Insufficiently Active (09/29/2022)  Social Connections: Unknown (09/29/2022)  Stress: No Stress Concern Present (09/29/2022)  Tobacco Use: High Risk (11/24/2022)    CCM Care Plan  Allergies  Allergen Reactions   Penicillins Hives and Swelling    As a child. Has patient had a PCN reaction causing immediate rash, facial/tongue/throat swelling, SOB or lightheadedness with hypotension: YES  Has patient had a PCN reaction causing severe rash involving mucus membranes or skin necrosis: UNKNOWN Has patient had a PCN reaction that required hospitalization: UNKNOWN Has patient had a PCN reaction occurring within the last 10 years: NO   Aspirin Nausea Only   Ibuprofen Nausea Only   Lyrica [Pregabalin] Nausea Only   Acetaminophen Nausea Only    Medications Reviewed Today      Reviewed by Quentin Angst, CMA (Certified Medical Assistant) on 11/24/22 at 83  Med List Status: <None>   Medication Order Taking? Sig Documenting Provider Last Dose Status Informant  apixaban (ELIQUIS) 5 MG TABS tablet [id number] Yes Take 1 tablet (5 mg total) by mouth 2 (two) times daily. Patient receives via [name] patient assistance through Dec 2023 Rockey Situ, Kathlene November, MD Taking Active   blood glucose meter kit and supplies KIT [id number] Yes Dispense based on patient and insurance preference. Use up to tid E11.65, LON:99 [name], MD Taking Active   Budeson-Glycopyrrol-Formoterol (BREZTRI AEROSPHERE) 160-9-4.8 MCG/ACT Hollie Salk [id number] Yes Inhale 2 puffs into the lungs 2 (two) times daily. Patient receives via [employer] Patient Assistance. [name], MD Taking Active   diltiazem (DILT-XR) 240 MG 24 hr capsule [id number] Yes Take 1 capsule (240 mg total) by mouth daily. Gerrie Nordmann, NP Taking Active   gabapentin (NEURONTIN) 300 MG capsule [id number] Yes Take 300-600 mg by mouth See admin instructions. Take 1 capsule (300 mg) by mouth in the morning, take 1 capsule (300 mg) by mouth at noon, and 2 capsules (600 mg) by mouth in the evening [provider] Taking Active Self  Lancet Devices [place] [id number] Yes 1 each by Does not apply route 3 (three) times daily as needed. What ins will cover dx:E11.65, LON:99 tid Teodora Medici, DO Taking Active   pioglitazone (ACTOS) 15 MG tablet [id number] Yes Take 1 tablet (15 mg total) by mouth daily. [name], MD Taking Active   potassium chloride (KLOR-CON) 10 MEQ tablet [id number] Yes Take 1 tablet (10 mEq total) by mouth every Monday, Wednesday, and Friday. Gerrie Nordmann, NP Taking Active   pravastatin (PRAVACHOL) 20 MG tablet [id number] Yes Take 1 tablet (20 mg total) by mouth daily with supper. [name], MD Taking Active   tiZANidine (ZANAFLEX) 4 MG tablet [id number] Yes Take 4 mg by mouth every 12 (twelve) hours.  Allyne Gee, MD Taking Active Self  traMADol Veatrice Bourbon) 50 MG tablet [id number] Yes Take 50 mg by mouth every 12 (twelve) hours. Allyne Gee, MD Taking Active Self  Vitamin D, Ergocalciferol, (DRISDOL) 1.25 MG (50000 UNIT) CAPS capsule [id number] Yes Take 1 capsule (50,000 Units total) by mouth every 14 (fourteen) days. [name], MD  Taking Active   Med List Note Al Decant, RN 10/03/18 1438): UDS 10/03/18            Patient Active Problem List   Diagnosis Date Noted   Senile purpura 11/15/2022   Chronic a-fib 03/15/2022   Type 2 diabetes mellitus with diabetic polyneuropathy, with long-term current use of insulin 03/16/2021   DDD (degenerative disc disease), lumbar 03/06/2019   Primary osteoarthritis involving multiple joints 03/06/2019   Lumbar radiculopathy 11/05/2018   Lumbar post-laminectomy syndrome 11/05/2018   Diabetic polyneuropathy associated with type 2 diabetes mellitus 11/05/2018   CLE (columnar lined esophagus)    Gastric irritation    Duodenum ulcer    Personal history of colonic polyps    Polyp of sigmoid colon    Diverticulosis of large intestine without diverticulitis    Benign neoplasm of ascending colon    Osteoarthritis of both hands 09/04/2017   Chronic pain of left knee 09/04/2017   Lymphedema 07/07/2016   Chronic hepatitis C without hepatic coma 06/01/2016   Lumbar herniated disc 11/24/2015   Thrombocytopenia 11/11/2015   Hepatitis C antibody test positive 07/13/2015   Chronic constipation 07/13/2015   CN (constipation) 07/13/2015   Diabetes mellitus with coincident hypertension ([place]) 03/08/2015   Dyslipidemia 03/08/2015   Paranoid schizophrenia ([place]) 03/08/2015   Restless leg 03/08/2015   Callus of foot 03/08/2015   Claudication 03/08/2015   Chronic obstructive pulmonary disease 03/08/2015   Corn or callus 03/08/2015   Essential (primary) hypertension 03/08/2015   HLD (hyperlipidemia) 03/08/2015   Peripheral vascular disease  03/08/2015   Polyneuropathy 03/08/2015   Current tobacco use 03/08/2015   Vitamin D deficiency 04/26/2009   Avitaminosis D 04/26/2009    Immunization History  Administered Date(s) Administered   Fluad Quad(high Dose 65+) 04/28/2021, 06/06/2022   Influenza Inj Mdck Quad Pf 07/11/2019   Influenza, High Dose Seasonal PF 06/30/2016   Influenza,inj,Quad PF,6+ Mos 07/17/2017, 05/29/2018   Influenza-Unspecified 06/21/2020   PFIZER(Purple Top)SARS-COV-2 Vaccination 11/13/2019, 12/09/2019, 08/04/2020   PNEUMOCOCCAL CONJUGATE-20 09/19/2021   Pneumococcal Conjugate-13 07/13/2015   Pneumococcal Polysaccharide-23 12/23/2009, 03/29/2020   Tdap 12/23/2009, 03/29/2020   Zoster Recombinat (Shingrix) 03/16/2021, 07/23/2021    Conditions to be addressed/monitored:  Hypertension, Hyperlipidemia, Diabetes, Atrial Fibrillation, COPD, and Tobacco use  There are no care plans that you recently modified to display for this patient.   Medication Assistance:  Will re-apply for PAP at next follow-up.  Compliance/Adherence/Medication fill history: Care Gaps: Diabetic Eye Exam Zoster Vaccines Colonoscopy COVID-19 Vaccine  Star-Rating Drugs: Pravastatin 20 mg last filled on 10/11/21 for 90-DS  Patient's preferred pharmacy is:  [facility] [address] [place] - [place] [address] Phone: [phone] Fax: [place], [place] [address] [place] [address] Phone: [phone] Fax: [place] [id number] [place], [place] [address] Phone: [phone] Fax: [phone]  [place] [id number] - [place] [address] [place] [address] Phone: [phone] Fax: [phone]  Uses pill box? Yes Pt endorses 100% compliance  We discussed: Current pharmacy is preferred  with insurance plan and patient is satisfied with pharmacy services Patient decided to: Continue current medication management strategy  Care Plan and Follow Up Patient Decision:  Patient agrees to Care Plan and Follow-up.  Plan: Telephone follow up appointment with care management team member scheduled for:  11/28/2021 at 3:00 PM  Doristine Section, Para [place], [place] Pharmacist Practitioner  [facility] [phone]  Heart Failure (Goal: manage symptoms and prevent exacerbations) -Uncontrolled -Last ejection fraction: 45-50% (Date: Jan 2024) -HF type: HFmrEF (mildly reduced EF 41-49%) -NYHA Class: {CHL HP Upstream Pharm NYHA [phone]} -AHA HF Stage: {CHL HP Upstream Pharm AHA HF [phone]} -Current treatment: Diltiazem XR 240 mg daily  Losartan 12.5 mg daily  -Medications previously tried: ***  -Current home BP/HR readings: *** -Current home daily weights: *** -Current dietary habits: *** -Current exercise habits: *** -Educated on {CCM HF Counseling:25125} -{CCMPHARMDINTERVENTION:25122}   Hypertension (BP goal <130/80) -Controlled -Current treatment: Diltiazem XR 240 mg daily Losartan 12.5 mg daily  -Medications previously tried: NA  -Current home readings: NA -Denies hypotensive/hypertensive symptoms -Recommended to continue current medication  Atrial Fibrillation (Goal: prevent stroke and major bleeding) -Controlled -CHADSVASC: 4 -Current treatment: Rate control: Diltiazem XR 240 mg daily Anticoagulation: Eliquis 5 mg twice daily  -Medications previously tried: NA -Recommended to continue current medications   Hyperlipidemia: (LDL goal < 70) -Controlled -Current treatment: Pravastatin 20 mg daily -Medications previously tried: NA  -Recommended to continue current medication  Diabetes (A1c goal <7%) -Uncontrolled Diagnosed 20+ years ago -Current medications: Tresiba 8 units nightly  Pioglitazone 15 mg daily  -Medications  previously tried: Catering manager, Januvia, Metformin,   -Current home glucose readings fasting glucose: patient has not been monitoring his blood sugars.  -Reports hypoglycemic/hyperglycemic symptoms -Current meal patterns: Eating out 2-3 times weekly, struggles with sweets (cookies). -Current exercise patterns: 1x weekly (20 minutes) limited by PVD. -Recommended to continue current medication  COPD (Goal: control symptoms and prevent exacerbations) -Controlled -Current treatment  Breztri 2 puffs twice daily  -Medications previously tried: NA  -Exacerbations requiring treatment in last 6 months: Yes, hospitalized -Patient reports consistent use of maintenance inhaler. Headache if taking more than 4 days in a row. Seems to be improving the longer he is on the medication.  -Frequency of rescue inhaler use: None -Continue current medications   Tobacco use (Goal Quit smoking) -Uncontrolled -Previous quit attempts: None -Current treatment  1.5-2 ppd  -Patient is in a pre-contemplative stage of action and is not ready to quit.  -Provided contact information for [place] [facility]uit [facility] (1-800-QUIT-NOW) and encouraged patient to reach out to this group for support.  Chronic Pain (Goal: Minimize pain symptoms) -Controlled -Managed by Dr. Humphrey Rolls  -Current treatment  Gabapentin 300 1 cap AM, 1 cap Noon, 2 caps PM Tizanidine 2 mg twice daily  Tramadol 50 mg twice daily  -Medications previously tried: NA  -Recommended to continue current medication

## 2022-11-30 ENCOUNTER — Other Ambulatory Visit
Admission: RE | Admit: 2022-11-30 | Discharge: 2022-11-30 | Disposition: A | Discharge status: Home / Self Care | Payer: PPO | Attending: Cardiology | Admitting: Cardiology

## 2022-11-30 ENCOUNTER — Telehealth: Payer: Self-pay | Admitting: Cardiology

## 2022-11-30 DIAGNOSIS — R0602 Shortness of breath: Secondary | ICD-10-CM | POA: Diagnosis not present

## 2022-11-30 DIAGNOSIS — I2089 Other forms of angina pectoris: Secondary | ICD-10-CM | POA: Insufficient documentation

## 2022-11-30 DIAGNOSIS — R079 Chest pain, unspecified: Secondary | ICD-10-CM | POA: Insufficient documentation

## 2022-11-30 DIAGNOSIS — H34832 Tributary (branch) retinal vein occlusion, left eye, with macular edema: Secondary | ICD-10-CM | POA: Diagnosis not present

## 2022-11-30 DIAGNOSIS — I4819 Other persistent atrial fibrillation: Secondary | ICD-10-CM | POA: Insufficient documentation

## 2022-11-30 DIAGNOSIS — I10 Essential (primary) hypertension: Secondary | ICD-10-CM | POA: Diagnosis not present

## 2022-11-30 DIAGNOSIS — I5020 Unspecified systolic (congestive) heart failure: Secondary | ICD-10-CM | POA: Insufficient documentation

## 2022-11-30 LAB — BASIC METABOLIC PANEL
Anion gap: 7 (ref 5–15)
BUN: 10 mg/dL (ref 8–23)
CO2: 29 mmol/L (ref 22–32)
Calcium: 8.6 mg/dL — ABNORMAL LOW (ref 8.9–10.3)
Chloride: 103 mmol/L (ref 98–111)
Creatinine, Ser: 0.8 mg/dL (ref 0.61–1.24)
GFR, Estimated: >60 mL/min (ref >60)
Glucose, Bld: 165 mg/dL — ABNORMAL HIGH (ref 70–99)
Potassium: 3.3 mmol/L — ABNORMAL LOW (ref 3.5–5.1)
Sodium: 139 mmol/L (ref 135–145)

## 2022-11-30 LAB — HM DIABETES EYE EXAM

## 2022-11-30 MED ORDER — DAPAGLIFLOZIN PROPANEDIOL 10 MG PO TABS: 10.0000 mg | ORAL_TABLET | Freq: Every day | ORAL | 3 refills | Status: AC

## 2022-11-30 NOTE — Addendum Note (Signed)
Addended by: Daron Offer A on: 11/30/2022 08:45 AM   Modules accepted: Orders

## 2022-11-30 NOTE — Telephone Encounter (Signed)
Patient's BMP resulted with potassium of 3.3.  Patient currently takes potassium supplements on Monday Wednesday and Friday of 10 mEq.  He was advised to take his potassium supplements daily for 1 week.  Then will need a repeat BMP to ensure his potassium level has remained stable in 1-2 weeks. He does not require a refill on his potassium at this time.  Patient's questions were answered and he understood the changes to his medication regimen that were given to him today.

## 2022-12-01 ENCOUNTER — Other Ambulatory Visit: Payer: Self-pay | Admitting: *Deleted

## 2022-12-01 ENCOUNTER — Telehealth: Payer: Self-pay | Admitting: Family Medicine

## 2022-12-01 DIAGNOSIS — E876 Hypokalemia: Secondary | ICD-10-CM

## 2022-12-01 DIAGNOSIS — I5020 Unspecified systolic (congestive) heart failure: Secondary | ICD-10-CM

## 2022-12-01 NOTE — Telephone Encounter (Signed)
Pt was prescribed FARXIGA and was told that it was sent to [facility] pharmacy and he wouldn't have to pay anything for the prescription. Pt says when he went to [facility] to pick up the prescription they didn't know what he was talking about. Pt would like to know is he able to get the medication for free and is requesting someone call him back regarding this.

## 2022-12-01 NOTE — Telephone Encounter (Signed)
Medication was sent to specialty pharmacy. Patient notified.

## 2022-12-06 DIAGNOSIS — H34832 Tributary (branch) retinal vein occlusion, left eye, with macular edema: Secondary | ICD-10-CM | POA: Diagnosis not present

## 2022-12-07 ENCOUNTER — Telehealth: Payer: Self-pay

## 2022-12-07 NOTE — Progress Notes (Signed)
Care Coordination Pharmacy Assistant   Name: Mark Nash  MRN: [id number] DOB: [birth year]  Reason for Encounter: New Medication follow-up/PAP Status  I contacted the patient to follow-up to confirm that the patient stopped Pioglitazone and started [place]. Per patient he did discontinue the pioglitazone when CPP advised him to, but he just started the [place] on yesterday. At this time patient denies any abnormal swelling or shortness of breath. I informed the patient that since he just started the [place] on yesterday I will follow-up with him next week to check on his glucose readings since starting the medication. I also advised patient to give me a call if he starts experiencing side effects. Patient verbalized understanding.   I contacted [facility] to confirm if they received the patient's prescription. Per the representative prescription was received and request for patient's first shipment was sent over to the pharmacy today and it will take about 10-14 business days for the patient to receive the shipment.  Medications: Outpatient Encounter Medications as of 12/07/2022  Medication Sig   apixaban (ELIQUIS) 5 MG TABS tablet Take 1 tablet by mouth twice daily   blood glucose meter kit and supplies KIT Dispense based on patient and insurance preference. Use up to tid E11.65, LON:99   Budeson-Glycopyrrol-Formoterol (BREZTRI AEROSPHERE) 160-9-4.8 MCG/ACT AERO Inhale 2 puffs into the lungs 2 (two) times daily. Patient receives via [employer] Patient Assistance.   dapagliflozin propanediol (FARXIGA) 10 MG TABS tablet Take 1 tablet (10 mg total) by mouth daily.   diltiazem (DILT-XR) 240 MG 24 hr capsule Take 1 capsule (240 mg total) by mouth daily.   gabapentin (NEURONTIN) 300 MG capsule Take 300-600 mg by mouth See admin instructions. Take 1 capsule (300 mg) by mouth in the morning, take 1 capsule (300 mg) by mouth at noon, and 2 capsules (600 mg) by mouth in the evening   guaiFENesin (MUCINEX) 600 MG 12  hr tablet Take 600 mg by mouth 2 (two) times daily as needed for to loosen phlegm.   insulin detemir (LEVEMIR) 100 UNIT/ML injection Inject 8 Units into the skin at bedtime.   Lancet Devices MISC 1 each by Does not apply route 3 (three) times daily as needed. What ins will cover dx:E11.65, LON:99 tid   losartan (COZAAR) 25 MG tablet Take 0.5 tablets (12.5 mg total) by mouth daily.   potassium chloride (KLOR-CON) 10 MEQ tablet Take 1 tablet (10 mEq total) by mouth every Monday, Wednesday, and Friday.   pravastatin (PRAVACHOL) 20 MG tablet Take 1 tablet (20 mg total) by mouth daily with supper.   tiZANidine (ZANAFLEX) 4 MG tablet Take 4 mg by mouth every 12 (twelve) hours.   traMADol (ULTRAM) 50 MG tablet Take 50 mg by mouth every 12 (twelve) hours.   Vitamin D, Ergocalciferol, (DRISDOL) 1.25 MG (50000 UNIT) CAPS capsule Take 1 capsule (50,000 Units total) by mouth every 14 (fourteen) days.   No facility-administered encounter medications on file as of 12/07/2022.   Adelene Idler, CPA/CMA Clinical Pharmacist Assistant Phone: [phone]

## 2022-12-11 ENCOUNTER — Encounter: Payer: Self-pay | Admitting: Nurse Practitioner

## 2022-12-11 ENCOUNTER — Ambulatory Visit (INDEPENDENT_AMBULATORY_CARE_PROVIDER_SITE_OTHER): Payer: PPO | Admitting: Nurse Practitioner

## 2022-12-11 VITALS — BP 122/68 | HR 47 | Temp 97.9°F | Resp 18 | Ht 71.0 in | Wt 150.0 lb

## 2022-12-11 DIAGNOSIS — E876 Hypokalemia: Secondary | ICD-10-CM

## 2022-12-11 DIAGNOSIS — R600 Localized edema: Secondary | ICD-10-CM

## 2022-12-11 DIAGNOSIS — L03119 Cellulitis of unspecified part of limb: Secondary | ICD-10-CM

## 2022-12-11 MED ORDER — DOXYCYCLINE HYCLATE 100 MG PO TABS: 100.0000 mg | ORAL_TABLET | Freq: Two times a day (BID) | ORAL | 0 refills | Status: AC

## 2022-12-11 NOTE — Progress Notes (Signed)
BP 122/68   Pulse (!) 47   Temp 97.9 F (36.6 C)   Resp 18   Ht 5\' 11"  (1.803 m)   Wt 150 lb (68 kg)   SpO2 95%   BMI 20.92 kg/m    Subjective:    Patient ID: Mark Nash, male    DOB: 28-Nov-1955, 67 y.o.   MRN: [id number]  HPI: Mark Nash is a 67 y.o. male  Chief Complaint  Patient presents with   Leg Swelling    Difficulty walking   Leg swelling/pain: patient has a history of chronic lymphedema.  He has also had cellulitis in both legs in 2022. He is followed by cardiology.  Last seen on 11/24/2022. Last DVT work up was negative on 08/18/2021.  He has been on lasix in the past for his swelling. However his last potassium was low at 3.3 on 11/30/2022. He is currently taking potassium M, W and F.  Will recheck level . Today patient reports that he has bilateral leg pain and swelling that started three weeks ago.  He says it has started to improve on Sunday.   He says it still feels tight but better.  He does have cellulitis in both lower extremities.  He has a small wound on the front of the right lower leg. Cultured right leg wound.  Will start doxycycline and have patient follow up in 1 week. Discussed with patient to elevate legs, keep wound clean and dry.  Recommend using compression socks. Patient reports he does have some but does not like to use them.  Will also get cbc, cmp, and bnp.   Relevant past medical, surgical, family and social history reviewed and updated as indicated. Interim medical history since our last visit reviewed. Allergies and medications reviewed and updated.  Review of Systems  Constitutional: Negative for fever or weight change.  Respiratory: Negative for cough and shortness of breath.   Cardiovascular: Negative for chest pain or palpitations.  Gastrointestinal: Negative for abdominal pain, no bowel changes.  Musculoskeletal: Negative for gait problem or joint swelling.  Skin: Negative for rash. Positive for cellulitis in lower  extremities Neurological: Negative for dizziness or headache.  No other specific complaints in a complete review of systems (except as listed in HPI above).      Objective:    BP 122/68   Pulse (!) 47   Temp 97.9 F (36.6 C)   Resp 18   Ht 5\' 11"  (1.803 m)   Wt 150 lb (68 kg)   SpO2 95%   BMI 20.92 kg/m   Wt Readings from Last 3 Encounters:  12/11/22 150 lb (68 kg)  11/24/22 159 lb 12.8 oz (72.5 kg)  11/15/22 156 lb (70.8 kg)    Physical Exam  Constitutional: Patient appears well-developed and well-nourished.  No distress.  HEENT: head atraumatic, normocephalic, pupils equal and reactive to light, neck supple Cardiovascular: Normal rate, regular rhythm and normal heart sounds.  No murmur heard. No BLE edema. Pulmonary/Chest: Effort normal and breath sounds normal. No respiratory distress. Abdominal: Soft.  There is no tenderness. Lower extremity: redness and discoloration noted to lower extremities.   Psychiatric: Patient has a normal mood and affect. behavior is normal. Judgment and thought content normal.   Results for orders placed or performed in visit on 12/05/22  HM DIABETES EYE EXAM  Result Value Ref Range   HM Diabetic Eye Exam No Retinopathy No Retinopathy      Assessment & Plan:  Problem List Items Addressed This Visit   None Visit Diagnoses     Cellulitis of lower extremity, unspecified laterality    -  Primary   start doxycycline, sent wound culture, will get cbc, cmp and bnp.  keep legs clean and dry.  elevate and use compression socks, follow up in 1 week   Relevant Medications   doxycycline (VIBRA-TABS) 100 MG tablet   Other Relevant Orders   Wound culture   CBC with Differential/Platelet   Hypokalemia       rechecking potassium   Relevant Orders   COMPLETE METABOLIC PANEL WITH GFR   Leg edema       start doxycycline, sent wound culture, will get cbc, cmp and bnp. keep legs clean and dry. elevate and use compression socks, follow up in 1 week    Relevant Orders   Brain natriuretic peptide        Follow up plan: Return in about 1 week (around 12/18/2022) for follow up.

## 2022-12-12 LAB — COMPLETE METABOLIC PANEL WITH GFR
AG Ratio: 1.8 (calc) (ref 1.0–2.5)
ALT: 15 U/L (ref 9–46)
AST: 12 U/L (ref 10–35)
Albumin: 4 g/dL (ref 3.6–5.1)
Alkaline phosphatase (APISO): 123 U/L (ref 35–144)
BUN: 15 mg/dL (ref 7–25)
CO2: 27 mmol/L (ref 20–32)
Calcium: 8.9 mg/dL (ref 8.6–10.3)
Chloride: 103 mmol/L (ref 98–110)
Creat: 0.95 mg/dL (ref 0.70–1.35)
Globulin: 2.2 g/dL (calc) (ref 1.9–3.7)
Glucose, Bld: 124 mg/dL — ABNORMAL HIGH (ref 65–99)
Potassium: 4 mmol/L (ref 3.5–5.3)
Sodium: 140 mmol/L (ref 135–146)
Total Bilirubin: 0.9 mg/dL (ref 0.2–1.2)
Total Protein: 6.2 g/dL (ref 6.1–8.1)
eGFR: 88 mL/min/{1.73_m2} (ref >=60)

## 2022-12-12 LAB — CBC WITH DIFFERENTIAL/PLATELET
Absolute Monocytes: 561 cells/uL (ref 200–950)
Basophils Absolute: 19 cells/uL (ref 0–200)
Basophils Relative: 0.3 %
Eosinophils Absolute: 50 cells/uL (ref 15–500)
Eosinophils Relative: 0.8 %
HCT: 39.1 % (ref 38.5–50.0)
Hemoglobin: 13.2 g/dL (ref 13.2–17.1)
Lymphs Abs: 1329 cells/uL (ref 850–3900)
MCH: 31.1 pg (ref 27.0–33.0)
MCHC: 33.8 g/dL (ref 32.0–36.0)
MCV: 92.2 fL (ref 80.0–100.0)
MPV: 9.8 fL (ref 7.5–12.5)
Monocytes Relative: 8.9 %
Neutro Abs: 4341 cells/uL (ref 1500–7800)
Neutrophils Relative %: 68.9 %
Platelets: 201 10*3/uL (ref 140–400)
RBC: 4.24 10*6/uL (ref 4.20–5.80)
RDW: 12.9 % (ref 11.0–15.0)
Total Lymphocyte: 21.1 %
WBC: 6.3 10*3/uL (ref 3.8–10.8)

## 2022-12-12 LAB — BRAIN NATRIURETIC PEPTIDE: Brain Natriuretic Peptide: 89 pg/mL (ref <100)

## 2022-12-13 ENCOUNTER — Telehealth: Payer: Self-pay

## 2022-12-13 NOTE — Progress Notes (Signed)
Care Coordination Pharmacy Assistant   Name: Mark Nash  MRN: [id number] DOB: March 01, 1956  Reason for Encounter: New Medication Follow-up   I contacted patient to follow-up on his starting of Farxiga. Patient stated that so far he does not feel like he is experiencing any side effects from the [place]. Patient also advised he has not checked his blood sugars since starting.   Patient, however, does report that he is having leg and ankle swelling. Patient also reports that this started around the time he started taking the [place]. Patient did have a visit with PCP on 04/15 who thinks that the swelling is the result of an infection. Patient stated PCP reviewed all medications and did not mention anything regarding the [place]. Per patient she did take a leg culture in order to verify if he does have an infection. Patient denies any abnormal shortness of breath. Patient also mentioned he recently started a new blood pressure medication as well around the same time he started the [place]. Per patient he started Losartan Potassium 12.5 mg daily.   Patient advised that if he symptoms start to worsen to please give me a call. Patient also advised once he does check his blood sugars if his numbers are below 70 are above 200 to give me a call.   CPP will be notified.  Medications: Outpatient Encounter Medications as of 12/13/2022  Medication Sig   apixaban (ELIQUIS) 5 MG TABS tablet Take 1 tablet by mouth twice daily   blood glucose meter kit and supplies KIT Dispense based on patient and insurance preference. Use up to tid E11.65, LON:99   Budeson-Glycopyrrol-Formoterol (BREZTRI AEROSPHERE) 160-9-4.8 MCG/ACT AERO Inhale 2 puffs into the lungs 2 (two) times daily. Patient receives via [employer] Patient Assistance.   dapagliflozin propanediol (FARXIGA) 10 MG TABS tablet Take 1 tablet (10 mg total) by mouth daily.   diltiazem (DILT-XR) 240 MG 24 hr capsule Take 1 capsule (240 mg total) by mouth daily.    doxycycline (VIBRA-TABS) 100 MG tablet Take 1 tablet (100 mg total) by mouth 2 (two) times daily for 7 days.   gabapentin (NEURONTIN) 300 MG capsule Take 300-600 mg by mouth See admin instructions. Take 1 capsule (300 mg) by mouth in the morning, take 1 capsule (300 mg) by mouth at noon, and 2 capsules (600 mg) by mouth in the evening   guaiFENesin (MUCINEX) 600 MG 12 hr tablet Take 600 mg by mouth 2 (two) times daily as needed for to loosen phlegm.   insulin detemir (LEVEMIR) 100 UNIT/ML injection Inject 8 Units into the skin at bedtime.   Lancet Devices MISC 1 each by Does not apply route 3 (three) times daily as needed. What ins will cover dx:E11.65, LON:99 tid   losartan (COZAAR) 25 MG tablet Take 0.5 tablets (12.5 mg total) by mouth daily.   potassium chloride (KLOR-CON) 10 MEQ tablet Take 1 tablet (10 mEq total) by mouth every Monday, Wednesday, and Friday.   pravastatin (PRAVACHOL) 20 MG tablet Take 1 tablet (20 mg total) by mouth daily with supper.   tiZANidine (ZANAFLEX) 4 MG tablet Take 4 mg by mouth every 12 (twelve) hours.   traMADol (ULTRAM) 50 MG tablet Take 50 mg by mouth every 12 (twelve) hours.   Vitamin D, Ergocalciferol, (DRISDOL) 1.25 MG (50000 UNIT) CAPS capsule Take 1 capsule (50,000 Units total) by mouth every 14 (fourteen) days.   No facility-administered encounter medications on file as of 12/13/2022.    Adelene Idler, CPA/CMA Clinical Pharmacist Assistant  Phone: [phone]

## 2022-12-14 LAB — WOUND CULTURE
MICRO NUMBER:: 14830800
SPECIMEN QUALITY:: ADEQUATE

## 2022-12-15 NOTE — Progress Notes (Unsigned)
Name: Mark Nash   MRN: [id number]    DOB: Dec 01, 1955   Date:12/18/2022       Progress Note  Subjective  Chief Complaint  Follow Up  HPI  Cellulitis he was seen by Della Goo last week for increase in pain, swelling , worse on left lower extremity but present on both legs. Diagnosed with recurrent cellulitis and given rx for Doxy. Culture positive for staphy but resistant to erythromycin. He states swelling id won, but still has tenderness and redness seems to be going up his leg. He has been trying to elevated his legs. Denies fever or chills  Patient Active Problem List   Diagnosis Date Noted   Senile purpura 11/15/2022   Chronic a-fib 03/15/2022   Type 2 diabetes mellitus with diabetic polyneuropathy, with long-term current use of insulin 03/16/2021   DDD (degenerative disc disease), lumbar 03/06/2019   Primary osteoarthritis involving multiple joints 03/06/2019   Lumbar radiculopathy 11/05/2018   Lumbar post-laminectomy syndrome 11/05/2018   Diabetic polyneuropathy associated with type 2 diabetes mellitus 11/05/2018   CLE (columnar lined esophagus)    Gastric irritation    Duodenum ulcer    Personal history of colonic polyps    Polyp of sigmoid colon    Diverticulosis of large intestine without diverticulitis    Benign neoplasm of ascending colon    Osteoarthritis of both hands 09/04/2017   Chronic pain of left knee 09/04/2017   Lymphedema 07/07/2016   Chronic hepatitis C without hepatic coma 06/01/2016   Lumbar herniated disc 11/24/2015   Thrombocytopenia 11/11/2015   Hepatitis C antibody test positive 07/13/2015   Chronic constipation 07/13/2015   CN (constipation) 07/13/2015   Diabetes mellitus with coincident hypertension (HCC) 03/08/2015   Dyslipidemia 03/08/2015   Paranoid schizophrenia (HCC) 03/08/2015   Restless leg 03/08/2015   Callus of foot 03/08/2015   Claudication 03/08/2015   Chronic obstructive pulmonary disease 03/08/2015   Corn or callus  03/08/2015   Essential (primary) hypertension 03/08/2015   HLD (hyperlipidemia) 03/08/2015   Peripheral vascular disease 03/08/2015   Polyneuropathy 03/08/2015   Current tobacco use 03/08/2015   Vitamin D deficiency 04/26/2009   Avitaminosis D 04/26/2009    Past Surgical History:  Procedure Laterality Date   BACK SURGERY     CARDIOVERSION N/A 05/20/2021   Procedure: CARDIOVERSION;  Surgeon: Iran Ouch, MD;  Location: [facility];  Service: Cardiovascular;  Laterality: N/A;   CATARACT EXTRACTION W/PHACO Right 10/31/2021   Procedure: CATARACT EXTRACTION PHACO AND INTRAOCULAR LENS PLACEMENT (IOC) RIGHT DIABETIC;  Surgeon: Nevada Crane, MD;  Location: [facility];  Service: Ophthalmology;  Laterality: Right;  Diabetic 4.34 00:42.8   COLONOSCOPY WITH PROPOFOL N/A 09/07/2017   Procedure: COLONOSCOPY WITH PROPOFOL;  Surgeon: Pasty Spillers, MD;  Location: [facility];  Service: Endoscopy;  Laterality: N/A;   ESOPHAGOGASTRODUODENOSCOPY (EGD) WITH PROPOFOL N/A 12/07/2017   Procedure: ESOPHAGOGASTRODUODENOSCOPY (EGD) WITH PROPOFOL;  Surgeon: Pasty Spillers, MD;  Location: [facility];  Service: Endoscopy;  Laterality: N/A;   EYE SURGERY     FLEXIBLE SIGMOIDOSCOPY N/A 11/09/2017   Procedure: FLEXIBLE SIGMOIDOSCOPY;  Surgeon: Pasty Spillers, MD;  Location: [facility];  Service: Endoscopy;  Laterality: N/A;   HERNIA REPAIR     inguinal   INGUINAL HERNIA REPAIR Bilateral 06/21/2018   Procedure: LAPAROSCOPIC BILATERAL INGUINAL HERNIA REPAIR;  Surgeon: Ancil Linsey, MD;  Location: [facility];  Service: General;  Laterality: Bilateral;   L3 TO L5 LAMINECTOMY FOR DECOMPRESSION  07/17/2016   [place]  [facility]   REPAIR OF CEREBROSPINAL FLUID LEAK N/A 09/08/2016   Procedure: Lumbar wound exploration, repair of pseudomenigocele;  Surgeon: Loura Halt Ditty, MD;  Location: [facility];  Service: Neurosurgery;  Laterality: N/A;  Lumbar wound  exploration, repair of pseudomenigocele   SINUSOTOMY      Family History  Problem Relation Age of Onset   Heart failure Mother    Hypertension Mother    Diverticulitis Mother    Colitis Mother    Diabetes Father    Hypertension Father    Heart disease Father    Diabetes Sister    Heart failure Brother    Diverticulitis Brother     Social History   Tobacco Use   Smoking status: Every Day    Packs/day: 1.50    Years: 46.00    Additional pack years: 0.00    Total pack years: 69.00    Types: Cigarettes   Smokeless tobacco: Never  Substance Use Topics   Alcohol use: No    Alcohol/week: 0.0 standard drinks of alcohol     Current Outpatient Medications:    apixaban (ELIQUIS) 5 MG TABS tablet, Take 1 tablet by mouth twice daily, Disp: 60 tablet, Rfl: 5   blood glucose meter kit and supplies KIT, Dispense based on patient and insurance preference. Use up to tid E11.65, LON:99, Disp: 1 each, Rfl: 0   Budeson-Glycopyrrol-Formoterol (BREZTRI AEROSPHERE) 160-9-4.8 MCG/ACT AERO, Inhale 2 puffs into the lungs 2 (two) times daily. Patient receives via [employer] Patient Assistance., Disp: 32.1 g, Rfl: 3   dapagliflozin propanediol (FARXIGA) 10 MG TABS tablet, Take 1 tablet (10 mg total) by mouth daily., Disp: 90 tablet, Rfl: 3   diltiazem (DILT-XR) 240 MG 24 hr capsule, Take 1 capsule (240 mg total) by mouth daily., Disp: 90 capsule, Rfl: 1   doxycycline (VIBRA-TABS) 100 MG tablet, Take 1 tablet (100 mg total) by mouth 2 (two) times daily for 7 days., Disp: 14 tablet, Rfl: 0   gabapentin (NEURONTIN) 300 MG capsule, Take 300-600 mg by mouth See admin instructions. Take 1 capsule (300 mg) by mouth in the morning, take 1 capsule (300 mg) by mouth at noon, and 2 capsules (600 mg) by mouth in the evening, Disp: , Rfl:    guaiFENesin (MUCINEX) 600 MG 12 hr tablet, Take 600 mg by mouth 2 (two) times daily as needed for to loosen phlegm., Disp: , Rfl:    insulin detemir (LEVEMIR) 100 UNIT/ML injection,  Inject 8 Units into the skin at bedtime., Disp: , Rfl:    Lancet Devices MISC, 1 each by Does not apply route 3 (three) times daily as needed. What ins will cover dx:E11.65, LON:99 tid, Disp: 100 each, Rfl: 12   losartan (COZAAR) 25 MG tablet, Take 0.5 tablets (12.5 mg total) by mouth daily., Disp: 45 tablet, Rfl: 0   pravastatin (PRAVACHOL) 20 MG tablet, Take 1 tablet (20 mg total) by mouth daily with supper., Disp: 90 tablet, Rfl: 3   tiZANidine (ZANAFLEX) 4 MG tablet, Take 4 mg by mouth every 12 (twelve) hours., Disp: , Rfl:    traMADol (ULTRAM) 50 MG tablet, Take 50 mg by mouth every 12 (twelve) hours., Disp: , Rfl:    Vitamin D, Ergocalciferol, (DRISDOL) 1.25 MG (50000 UNIT) CAPS capsule, Take 1 capsule (50,000 Units total) by mouth every 14 (fourteen) days., Disp: 6 capsule, Rfl: 1   potassium chloride (KLOR-CON) 10 MEQ tablet, Take 1 tablet (10 mEq total) by mouth every Monday, Wednesday, and Friday., Disp:  36 tablet, Rfl: 0  Allergies  Allergen Reactions   Penicillins Hives and Swelling    As a child. Has patient had a PCN reaction causing immediate rash, facial/tongue/throat swelling, SOB or lightheadedness with hypotension: YES  Has patient had a PCN reaction causing severe rash involving mucus membranes or skin necrosis: UNKNOWN Has patient had a PCN reaction that required hospitalization: UNKNOWN Has patient had a PCN reaction occurring within the last 10 years: NO   Aspirin Nausea Only   Ibuprofen Nausea Only   Lyrica [Pregabalin] Nausea Only   Acetaminophen Nausea Only    I personally reviewed active problem list, medication list, allergies, family history, social history, health maintenance with the patient/caregiver today.   ROS  Ten systems reviewed and is negative except as mentioned in HPI He states cough is worse due to pollen   Objective  Vitals:   12/18/22 1405  BP: 118/70  Pulse: (!) 54  Resp: 18  Temp: 98 F (36.7 C)  TempSrc: Oral  SpO2: 96%  Weight:  151 lb (68.5 kg)  Height: 5\' 11"  (1.803 m)    Body mass index is 21.06 kg/m.  Physical Exam  Constitutional: Patient appears well-developed and well-nourished.  No distress.  HEENT: head atraumatic, normocephalic, pupils equal and reactive to light, neck supple, throat within normal limits Cardiovascular: Normal rate, regular rhythm and normal heart sounds.  No murmur heard. No BLE edema. Pulmonary/Chest: Effort normal and breath sounds normal. No respiratory distress. Lower extremity: positive for erythema also has some induration of skin and some pilling of skin of left lower extremity redness up to upper third of lower extremity on left side  Abdominal: Soft.  There is no tenderness. Psychiatric: Patient has a normal mood and affect. behavior is normal. Judgment and thought content normal.    PHQ2/9:    12/18/2022    2:07 PM 12/11/2022    3:07 PM 11/15/2022    2:08 PM 09/29/2022    1:09 PM 07/17/2022    1:31 PM  Depression screen PHQ 2/9  Decreased Interest 0 0 0 0 0  Down, Depressed, Hopeless 0 0 0 0 0  PHQ - 2 Score 0 0 0 0 0  Altered sleeping 0 0 0  0  Tired, decreased energy 1 0 3  0  Change in appetite 0 0 0  0  Feeling bad or failure about yourself  0 0 0  0  Trouble concentrating 0 0 0  0  Moving slowly or fidgety/restless 0 0 0  0  Suicidal thoughts 0 0 0  0  PHQ-9 Score 1 0 3  0  Difficult doing work/chores Not difficult at all Not difficult at all       phq 9 is negative   Fall Risk:    12/18/2022    2:06 PM 12/11/2022    3:04 PM 11/15/2022    2:08 PM 09/29/2022    1:04 PM 07/17/2022    1:31 PM  Fall Risk   Falls in the past year? 0 0 0 0 0  Number falls in past yr:  0 0 0 0  Injury with Fall?  0 0 0 0  Risk for fall due to : No Fall Risks  No Fall Risks No Fall Risks No Fall Risks  Follow up Falls prevention discussed;Education provided;Falls evaluation completed  Falls prevention discussed Education provided;Falls prevention discussed Falls prevention  discussed      Functional Status Survey: Is the patient deaf or have difficulty  hearing?: No Does the patient have difficulty seeing, even when wearing glasses/contacts?: Yes Does the patient have difficulty concentrating, remembering, or making decisions?: No Does the patient have difficulty walking or climbing stairs?: Yes Does the patient have difficulty dressing or bathing?: Yes Does the patient have difficulty doing errands alone such as visiting a doctor's office or shopping?: No    Assessment & Plan  1. Recurrent cellulitis of lower extremity  We will change antibiotics to sulfa, advised probiotic due to risk of c. Difi colitis. Marked the redness with a sharpie to make sure redness is improving, advised to contact us if gets worse instead of better or other symptoms of infection, nausea, decrease in appetite, fever or chills  - sulfamethoxazole-trimethoprim (BACTRIM DS) 800-160 MG tablet; Take 1 tablet by mouth 2 (two) times daily.  Dispense: 10 tablet; Refill: 0

## 2022-12-18 ENCOUNTER — Ambulatory Visit (INDEPENDENT_AMBULATORY_CARE_PROVIDER_SITE_OTHER): Payer: PPO | Admitting: Family Medicine

## 2022-12-18 ENCOUNTER — Encounter: Payer: Self-pay | Admitting: Family Medicine

## 2022-12-18 VITALS — BP 118/70 | HR 54 | Temp 98.0°F | Resp 18 | Ht 71.0 in | Wt 151.0 lb

## 2022-12-18 DIAGNOSIS — L03119 Cellulitis of unspecified part of limb: Secondary | ICD-10-CM

## 2022-12-18 MED ORDER — SULFAMETHOXAZOLE-TRIMETHOPRIM 800-160 MG PO TABS: 1.0000 | ORAL_TABLET | Freq: Two times a day (BID) | ORAL | 0 refills | Status: DC

## 2022-12-18 NOTE — Patient Instructions (Signed)
Take probiotics to prevent upset stomach due to antibiotics.

## 2022-12-26 ENCOUNTER — Other Ambulatory Visit: Payer: PPO

## 2022-12-26 NOTE — Progress Notes (Unsigned)
Name: Mark Nash   MRN: [id number]    DOB: [birth year]   Date:12/27/2022       Progress Note  Subjective  Chief Complaint  Follow Up  HPI  Cellulitis he was seen by Della Goo last week for increase in pain, swelling , worse on left lower extremity but present on both legs. Diagnosed with recurrent cellulitis and given rx for Doxy. Culture positive for staphy but resistant to erythromycin. He saw me last week and he noticed that swelling was going down, but it was still tender and the redness was ascending up his leg.We gave him reassurance, keep area clean, elevated both legs and marked with a sharpie for him to monitor if going above the line. He states redness is down now more purple than red, still feels tender to touch and skin is thick. He has seen vascular surgeon in the past, he has DM  Patient Active Problem List   Diagnosis Date Noted   Senile purpura (HCC) 11/15/2022   Chronic a-fib (HCC) 03/15/2022   Type 2 diabetes mellitus with diabetic polyneuropathy, with long-term current use of insulin (HCC) 03/16/2021   DDD (degenerative disc disease), lumbar 03/06/2019   Primary osteoarthritis involving multiple joints 03/06/2019   Lumbar radiculopathy 11/05/2018   Lumbar post-laminectomy syndrome 11/05/2018   Diabetic polyneuropathy associated with type 2 diabetes mellitus (HCC) 11/05/2018   CLE (columnar lined esophagus)    Gastric irritation    Duodenum ulcer    Personal history of colonic polyps    Polyp of sigmoid colon    Diverticulosis of large intestine without diverticulitis    Benign neoplasm of ascending colon    Osteoarthritis of both hands 09/04/2017   Chronic pain of left knee 09/04/2017   Lymphedema 07/07/2016   Chronic hepatitis C without hepatic coma (HCC) 06/01/2016   Lumbar herniated disc 11/24/2015   Thrombocytopenia (HCC) 11/11/2015   Hepatitis C antibody test positive 07/13/2015   Chronic constipation 07/13/2015   CN (constipation) 07/13/2015    Diabetes mellitus with coincident hypertension (HCC) 03/08/2015   Dyslipidemia 03/08/2015   Paranoid schizophrenia (HCC) 03/08/2015   Restless leg 03/08/2015   Callus of foot 03/08/2015   Claudication (HCC) 03/08/2015   Chronic obstructive pulmonary disease (HCC) 03/08/2015   Corn or callus 03/08/2015   Essential (primary) hypertension 03/08/2015   HLD (hyperlipidemia) 03/08/2015   Peripheral vascular disease (HCC) 03/08/2015   Polyneuropathy 03/08/2015   Current tobacco use 03/08/2015   Vitamin D deficiency 04/26/2009   Avitaminosis D 04/26/2009    Past Surgical History:  Procedure Laterality Date   BACK SURGERY     CARDIOVERSION N/A 05/20/2021   Procedure: CARDIOVERSION;  Surgeon: Iran Ouch, MD;  Location: [facility];  Service: Cardiovascular;  Laterality: N/A;   CATARACT EXTRACTION W/PHACO Right 10/31/2021   Procedure: CATARACT EXTRACTION PHACO AND INTRAOCULAR LENS PLACEMENT (IOC) RIGHT DIABETIC;  Surgeon: Nevada Crane, MD;  Location: [facility];  Service: Ophthalmology;  Laterality: Right;  Diabetic 4.34 00:42.8   COLONOSCOPY WITH PROPOFOL N/A 09/07/2017   Procedure: COLONOSCOPY WITH PROPOFOL;  Surgeon: Pasty Spillers, MD;  Location: [facility];  Service: Endoscopy;  Laterality: N/A;   ESOPHAGOGASTRODUODENOSCOPY (EGD) WITH PROPOFOL N/A 12/07/2017   Procedure: ESOPHAGOGASTRODUODENOSCOPY (EGD) WITH PROPOFOL;  Surgeon: Pasty Spillers, MD;  Location: [facility];  Service: Endoscopy;  Laterality: N/A;   EYE SURGERY     FLEXIBLE SIGMOIDOSCOPY N/A 11/09/2017   Procedure: FLEXIBLE SIGMOIDOSCOPY;  Surgeon: Pasty Spillers, MD;  Location: [facility];  Service: Endoscopy;  Laterality: N/A;   HERNIA REPAIR     inguinal   INGUINAL HERNIA REPAIR Bilateral 06/21/2018   Procedure: LAPAROSCOPIC BILATERAL INGUINAL HERNIA REPAIR;  Surgeon: Ancil Linsey, MD;  Location: [facility];  Service: General;  Laterality: Bilateral;   L3 TO L5  LAMINECTOMY FOR DECOMPRESSION  07/17/2016   [facility]   REPAIR OF CEREBROSPINAL FLUID LEAK N/A 09/08/2016   Procedure: Lumbar wound exploration, repair of pseudomenigocele;  Surgeon: Loura Halt Ditty, MD;  Location: [facility];  Service: Neurosurgery;  Laterality: N/A;  Lumbar wound exploration, repair of pseudomenigocele   SINUSOTOMY      Family History  Problem Relation Age of Onset   Heart failure Mother    Hypertension Mother    Diverticulitis Mother    Colitis Mother    Diabetes Father    Hypertension Father    Heart disease Father    Diabetes Sister    Heart failure Brother    Diverticulitis Brother     Social History   Tobacco Use   Smoking status: Every Day    Packs/day: 1.50    Years: 46.00    Additional pack years: 0.00    Total pack years: 69.00    Types: Cigarettes   Smokeless tobacco: Never  Substance Use Topics   Alcohol use: No    Alcohol/week: 0.0 standard drinks of alcohol     Current Outpatient Medications:    apixaban (ELIQUIS) 5 MG TABS tablet, Take 1 tablet by mouth twice daily, Disp: 60 tablet, Rfl: 5   blood glucose meter kit and supplies KIT, Dispense based on patient and insurance preference. Use up to tid E11.65, LON:99, Disp: 1 each, Rfl: 0   Budeson-Glycopyrrol-Formoterol (BREZTRI AEROSPHERE) 160-9-4.8 MCG/ACT AERO, Inhale 2 puffs into the lungs 2 (two) times daily. Patient receives via [place] Patient Assistance., Disp: 32.1 g, Rfl: 3   dapagliflozin propanediol (FARXIGA) 10 MG TABS tablet, Take 1 tablet (10 mg total) by mouth daily., Disp: 90 tablet, Rfl: 3   diltiazem (DILT-XR) 240 MG 24 hr capsule, Take 1 capsule (240 mg total) by mouth daily., Disp: 90 capsule, Rfl: 1   gabapentin (NEURONTIN) 300 MG capsule, Take 300-600 mg by mouth See admin instructions. Take 1 capsule (300 mg) by mouth in the morning, take 1 capsule (300 mg) by mouth at noon, and 2 capsules (600 mg) by mouth in the evening, Disp: , Rfl:    guaiFENesin  (MUCINEX) 600 MG 12 hr tablet, Take 600 mg by mouth 2 (two) times daily as needed for to loosen phlegm., Disp: , Rfl:    insulin detemir (LEVEMIR) 100 UNIT/ML injection, Inject 8 Units into the skin at bedtime., Disp: , Rfl:    Lancet Devices MISC, 1 each by Does not apply route 3 (three) times daily as needed. What ins will cover dx:E11.65, LON:99 tid, Disp: 100 each, Rfl: 12   losartan (COZAAR) 25 MG tablet, Take 0.5 tablets (12.5 mg total) by mouth daily., Disp: 45 tablet, Rfl: 0   pravastatin (PRAVACHOL) 20 MG tablet, Take 1 tablet (20 mg total) by mouth daily with supper., Disp: 90 tablet, Rfl: 3   sulfamethoxazole-trimethoprim (BACTRIM DS) 800-160 MG tablet, Take 1 tablet by mouth 2 (two) times daily., Disp: 10 tablet, Rfl: 0   tiZANidine (ZANAFLEX) 4 MG tablet, Take 4 mg by mouth every 12 (twelve) hours., Disp: , Rfl:    traMADol (ULTRAM) 50 MG tablet, Take 50 mg by mouth every 12 (twelve) hours., Disp: ,  Rfl:    Vitamin D, Ergocalciferol, (DRISDOL) 1.25 MG (50000 UNIT) CAPS capsule, TAKE 1 CAPSULE BY MOUTH EVERY TWO WEEKS, Disp: 6 capsule, Rfl: 0   potassium chloride (KLOR-CON) 10 MEQ tablet, Take 1 tablet (10 mEq total) by mouth every Monday, Wednesday, and Friday., Disp: 36 tablet, Rfl: 0  Allergies  Allergen Reactions   Penicillins Hives and Swelling    As a child. Has patient had a PCN reaction causing immediate rash, facial/tongue/throat swelling, SOB or lightheadedness with hypotension: YES  Has patient had a PCN reaction causing severe rash involving mucus membranes or skin necrosis: UNKNOWN Has patient had a PCN reaction that required hospitalization: UNKNOWN Has patient had a PCN reaction occurring within the last 10 years: NO   Aspirin Nausea Only   Ibuprofen Nausea Only   Lyrica [Pregabalin] Nausea Only   Acetaminophen Nausea Only    I personally reviewed active problem list, medication list, allergies, family history, social history, health maintenance with the  patient/caregiver today.   ROS  Ten systems reviewed and is negative except as mentioned in HPI   Objective  Vitals:   12/27/22 1519  BP: 100/64  Pulse: (!) 54  Resp: 16  SpO2: 96%  Weight: 151 lb (68.5 kg)  Height: 5\' 11"  (1.803 m)    Body mass index is 21.06 kg/m.  Physical Exam  Constitutional: Patient appears well-developed and well-nourished.  No distress.  HEENT: head atraumatic, normocephalic, pupils equal and reactive to light, neck supple Cardiovascular: Normal rate, regular rhythm and normal heart sounds.  No murmur heard. No BLE edema. Pulmonary/Chest: Effort normal and breath sounds normal. No respiratory distress. Abdominal: Soft.  There is no tenderness. Lower extremity: thick skin, violaceous, tender to touch, no oozing, mild edema - see attached photo Psychiatric: Patient has a normal mood and affect. behavior is normal. Judgment and thought content normal.    PHQ2/9:    12/27/2022    3:18 PM 12/18/2022    2:07 PM 12/11/2022    3:07 PM 11/15/2022    2:08 PM 09/29/2022    1:09 PM  Depression screen PHQ 2/9  Decreased Interest 0 0 0 0 0  Down, Depressed, Hopeless 0 0 0 0 0  PHQ - 2 Score 0 0 0 0 0  Altered sleeping  0 0 0   Tired, decreased energy  1 0 3   Change in appetite  0 0 0   Feeling bad or failure about yourself   0 0 0   Trouble concentrating  0 0 0   Moving slowly or fidgety/restless  0 0 0   Suicidal thoughts  0 0 0   PHQ-9 Score  1 0 3   Difficult doing work/chores  Not difficult at all Not difficult at all      phq 9 is negative   Fall Risk:    12/27/2022    3:18 PM 12/18/2022    2:06 PM 12/11/2022    3:04 PM 11/15/2022    2:08 PM 09/29/2022    1:04 PM  Fall Risk   Falls in the past year? 0 0 0 0 0  Number falls in past yr: 0  0 0 0  Injury with Fall? 0  0 0 0  Risk for fall due to : No Fall Risks No Fall Risks  No Fall Risks No Fall Risks  Follow up Falls prevention discussed Falls prevention discussed;Education provided;Falls  evaluation completed  Falls prevention discussed Education provided;Falls prevention discussed  Functional Status Survey: Is the patient deaf or have difficulty hearing?: No Does the patient have difficulty seeing, even when wearing glasses/contacts?: No Does the patient have difficulty concentrating, remembering, or making decisions?: No Does the patient have difficulty walking or climbing stairs?: Yes Does the patient have difficulty dressing or bathing?: Yes Does the patient have difficulty doing errands alone such as visiting a doctor's office or shopping?: No    Assessment & Plan  1. Dermatitis  - Ambulatory referral to Dermatology   It may be stasis dermatitis

## 2022-12-27 ENCOUNTER — Ambulatory Visit: Payer: PPO

## 2022-12-27 ENCOUNTER — Other Ambulatory Visit: Payer: Self-pay | Admitting: Family Medicine

## 2022-12-27 ENCOUNTER — Encounter: Payer: Self-pay | Admitting: Family Medicine

## 2022-12-27 ENCOUNTER — Ambulatory Visit (INDEPENDENT_AMBULATORY_CARE_PROVIDER_SITE_OTHER): Payer: PPO | Admitting: Family Medicine

## 2022-12-27 VITALS — BP 100/64 | HR 54 | Resp 16 | Ht 71.0 in | Wt 151.0 lb

## 2022-12-27 DIAGNOSIS — L309 Dermatitis, unspecified: Secondary | ICD-10-CM | POA: Diagnosis not present

## 2022-12-27 DIAGNOSIS — Z794 Long term (current) use of insulin: Secondary | ICD-10-CM

## 2022-12-27 DIAGNOSIS — E1142 Type 2 diabetes mellitus with diabetic polyneuropathy: Secondary | ICD-10-CM

## 2022-12-27 NOTE — Progress Notes (Signed)
Chronic Care Management Pharmacy Note  12/27/2022 Name:  Mark Nash MRN:  [id number] DOB:  [birth year]  Summary: Patient presents for pharmacy follow-up.   -Cellulitis and swelling have much improved. He has a follow-up this afternoon with his PCP to evaluate his legs.   -Patient is receiving [place] through patient assistance. He is tolerating the medication well. He had CMP rechecked 1-2 weeks after starting [place] and his creatinine/potassium were stable. -Continue current medications   Recommendations/Changes made from today's visit: -Continue current medication   Plan: CPP follow-up in 3 months  Subjective: Mark Nash is an 67 y.o. year old male who is a primary patient of Alba Cory, MD.  The care coordination team was consulted for assistance with disease management and care coordination needs.    Engaged with patient by telephone for follow up visit.  Recent office visits: 12/18/22: Patient presented to Dr. Carlynn Purl for cellulitis. Bactrim.  12/11/22: Patient presented to Della Goo, FNP for cellulitis. Doxycycline.  11/15/22: Patient presented to Dr. Carlynn Purl for follow-up. Pioglitazone 15 mg daily.   Recent consult visits: 11/24/22: Patient presented to Charlsie Quest, NP (Cardiology). Losartan 12.5 mg daily   Hospital visits: None in past 6 months.    Objective:  Lab Results  Component Value Date   CREATININE 0.95 12/11/2022   BUN 15 12/11/2022   EGFR 88 12/11/2022   GFRNONAA >60 11/30/2022   GFRAA 116 09/15/2020   NA 140 12/11/2022   K 4.0 12/11/2022   CALCIUM 8.9 12/11/2022   CO2 27 12/11/2022   GLUCOSE 124 (H) 12/11/2022    Lab Results  Component Value Date/Time   HGBA1C 7.4 (A) 11/15/2022 02:13 PM   HGBA1C 7.6 (A) 07/17/2022 01:33 PM   HGBA1C 8.8 (H) 11/11/2021 03:12 PM   HGBA1C 7.1 (H) 11/10/2019 02:27 PM   HGBA1C 6.6 07/11/2019 01:45 PM   HGBA1C 6.8 03/10/2019 10:08 AM   MICROALBUR 9.8 11/15/2022 02:48 PM   MICROALBUR 2.4 11/11/2021  03:12 PM   MICROALBUR 50 07/17/2017 02:17 PM   MICROALBUR negative 03/11/2015 10:59 AM    Last diabetic Eye exam:  Lab Results  Component Value Date/Time   HMDIABEYEEXA No Retinopathy 11/30/2022 12:00 AM    Last diabetic Foot exam:  Lab Results  Component Value Date/Time   HMDIABFOOTEX Normal 04/19/2022 12:00 AM     Lab Results  Component Value Date   CHOL 103 11/15/2022   HDL 61 11/15/2022   LDLCALC 29 11/15/2022   TRIG 46 11/15/2022   CHOLHDL 1.7 11/15/2022       Latest Ref Rng & Units 12/11/2022    3:33 PM 11/15/2022    2:48 PM 11/11/2021    3:12 PM  Hepatic Function  Total Protein 6.1 - 8.1 g/dL 6.2  6.0  6.1   AST 10 - 35 U/L 12  13  10    ALT 9 - 46 U/L 15  12  10    Total Bilirubin 0.2 - 1.2 mg/dL 0.9  1.0  0.8     Lab Results  Component Value Date/Time   TSH 1.706 04/19/2021 10:04 PM       Latest Ref Rng & Units 12/11/2022    3:33 PM 11/15/2022    2:48 PM 08/18/2021    2:17 PM  CBC  WBC 3.8 - 10.8 Thousand/uL 6.3  7.0  6.7   Hemoglobin 13.2 - 17.1 g/dL 40.9  81.1  91.4   Hematocrit 38.5 - 50.0 % 39.1  40.3  43.9   Platelets 140 -  400 Thousand/uL 201  177  184     Lab Results  Component Value Date/Time   VD25OH 66 11/15/2022 02:48 PM   VD25OH 26 (L) 03/30/2017 12:04 PM    Clinical ASCVD: No  The ASCVD Risk score (Arnett DK, et al., 2019) failed to calculate for the following reasons:   The valid total cholesterol range is 130 to 320 mg/dL       8/65/7846    9:62 PM 12/11/2022    3:07 PM 11/15/2022    2:08 PM  Depression screen PHQ 2/9  Decreased Interest 0 0 0  Down, Depressed, Hopeless 0 0 0  PHQ - 2 Score 0 0 0  Altered sleeping 0 0 0  Tired, decreased energy 1 0 3  Change in appetite 0 0 0  Feeling bad or failure about yourself  0 0 0  Trouble concentrating 0 0 0  Moving slowly or fidgety/restless 0 0 0  Suicidal thoughts 0 0 0  PHQ-9 Score 1 0 3  Difficult doing work/chores Not difficult at all Not difficult at all      Social  History   Tobacco Use  Smoking Status Every Day   Packs/day: 1.50   Years: 46.00   Additional pack years: 0.00   Total pack years: 69.00   Types: Cigarettes  Smokeless Tobacco Never   BP Readings from Last 3 Encounters:  12/18/22 118/70  12/11/22 122/68  11/24/22 130/70   Pulse Readings from Last 3 Encounters:  12/18/22 (!) 54  12/11/22 (!) 47  11/24/22 80   Wt Readings from Last 3 Encounters:  12/18/22 151 lb (68.5 kg)  12/11/22 150 lb (68 kg)  11/24/22 159 lb 12.8 oz (72.5 kg)   BMI Readings from Last 3 Encounters:  12/18/22 21.06 kg/m  12/11/22 20.92 kg/m  11/24/22 22.29 kg/m    Allergies  Allergen Reactions   Penicillins Hives and Swelling    As a child. Has patient had a PCN reaction causing immediate rash, facial/tongue/throat swelling, SOB or lightheadedness with hypotension: YES  Has patient had a PCN reaction causing severe rash involving mucus membranes or skin necrosis: UNKNOWN Has patient had a PCN reaction that required hospitalization: UNKNOWN Has patient had a PCN reaction occurring within the last 10 years: NO   Aspirin Nausea Only   Ibuprofen Nausea Only   Lyrica [Pregabalin] Nausea Only   Acetaminophen Nausea Only    Medications Reviewed Today     Reviewed by Benay Pike, CMA (Certified Medical Assistant) on 12/18/22 at 1406  Med List Status: <None>   Medication Order Taking? Sig Documenting Provider Last Dose Status Informant  apixaban (ELIQUIS) 5 MG TABS tablet [id number]  Take 1 tablet by mouth twice daily [name], MD  Active   blood glucose meter kit and supplies KIT [id number]  Dispense based on patient and insurance preference. Use up to tid E11.65, LON:99 Alba Cory, MD  Active   Budeson-Glycopyrrol-Formoterol (BREZTRI AEROSPHERE) 160-9-4.8 MCG/ACT Sandrea Matte [id number]  Inhale 2 puffs into the lungs 2 (two) times daily. Patient receives via [place] Patient Assistance. Alba Cory, MD  Active   dapagliflozin propanediol  (FARXIGA) 10 MG TABS tablet [id number]  Take 1 tablet (10 mg total) by mouth daily. Alba Cory, MD  Active   diltiazem (DILT-XR) 240 MG 24 hr capsule [id number]  Take 1 capsule (240 mg total) by mouth daily. Charlsie Quest, NP  Active   doxycycline (VIBRA-TABS) 100 MG tablet [id number]  Take 1 tablet (100 mg total)  by mouth 2 (two) times daily for 7 days. Berniece Salines, FNP  Active   gabapentin (NEURONTIN) 300 MG capsule [id number]  Take 300-600 mg by mouth See admin instructions. Take 1 capsule (300 mg) by mouth in the morning, take 1 capsule (300 mg) by mouth at noon, and 2 capsules (600 mg) by mouth in the evening [provider]  Active Self  guaiFENesin (MUCINEX) 600 MG 12 hr tablet [id number]  Take 600 mg by mouth 2 (two) times daily as needed for to loosen phlegm. [provider]  Active   insulin detemir (LEVEMIR) 100 UNIT/ML injection [id number]  Inject 8 Units into the skin at bedtime. [provider]  Active   Lancet Devices MISC [id number]  1 each by Does not apply route 3 (three) times daily as needed. What ins will cover dx:E11.65, LON:99 tid Margarita Mail, DO  Active   losartan (COZAAR) 25 MG tablet [id number]  Take 0.5 tablets (12.5 mg total) by mouth daily. Charlsie Quest, NP  Active   potassium chloride (KLOR-CON) 10 MEQ tablet [id number]  Take 1 tablet (10 mEq total) by mouth every Monday, Wednesday, and Friday. Charlsie Quest, NP  Expired 12/03/22 2359   pravastatin (PRAVACHOL) 20 MG tablet [id number]  Take 1 tablet (20 mg total) by mouth daily with supper. Alba Cory, MD  Active   tiZANidine (ZANAFLEX) 4 MG tablet [id number]  Take 4 mg by mouth every 12 (twelve) hours. Yevonne Pax, MD  Active Self  traMADol Janean Sark) 50 MG tablet [id number]  Take 50 mg by mouth every 12 (twelve) hours. Yevonne Pax, MD  Active Self  Vitamin D, Ergocalciferol, (DRISDOL) 1.25 MG (50000 UNIT) CAPS capsule [id number]  Take 1 capsule (50,000 Units total) by mouth every  14 (fourteen) days. Alba Cory, MD  Active   Med List Note Earlyne Iba, RN 10/03/18 1438): UDS 10/03/18            SDOH:  (Social Determinants of Health) assessments and interventions performed: Yes SDOH Interventions    Flowsheet Row Telephone from 10/19/2022 in [facility]ination Clinical Support from 09/29/2022 in [facility] Telephone from 03/20/2022 in [facility] Coordination Chronic Care Management from 09/28/2021 in [facility] Chronic Care Management from 08/03/2021 in [facility] Chronic Care Management from 07/20/2021 in [facility]  SDOH Interventions        Food Insecurity Interventions Intervention Not Indicated Intervention Not Indicated -- -- -- --  Housing Interventions Intervention Not Indicated Intervention Not Indicated -- -- -- --  Transportation Interventions Intervention Not Indicated Intervention Not Indicated Intervention Not Indicated -- -- --  Utilities Interventions -- Intervention Not Indicated -- -- -- --  Alcohol Usage Interventions -- Intervention Not Indicated (Score <7) -- -- -- --  Financial Strain Interventions -- Intervention Not Indicated -- Other (Comment)  [PAP] Other (Comment)  [PAP] Other (Comment)  [PAP]  Physical Activity Interventions -- Intervention Not Indicated -- -- -- --  Stress Interventions -- Intervention Not Indicated -- -- -- --  Social Connections Interventions -- Intervention Not Indicated -- -- -- --       Medication Assistance: [name] and Breztri obtained through [place] medication assistance program.    Medication Access: Within the past 30 days, how often has patient missed a dose of medication? None Is a pillbox or other method used to improve adherence? Yes  Factors that may affect medication  adherence? no barriers identified Are meds synced by current  pharmacy? No  Are meds delivered by current pharmacy? No  Does patient experience delays in picking up medications due to transportation concerns? No   [facility] Services Reviewed: Is patient disadvantaged to use [facility]?: Yes  Current Rx insurance plan: HTA Name and location of Current pharmacy:  [facility] [address] [place] - [address] [place] [address] Phone: [phone] Fax: [phone]  [facility] [phone] [place], [place] - [address] [place] [address] Phone: [phone] Fax: [phone]  [facility] - [place], [place] - [address] [place] [address] Phone: [phone] Fax: [phone]  [facility] services reviewed with patient today?: No  Patient requests to transfer care to [facility]?: No  Reason patient declined to change pharmacies: Disadvantaged due to insurance/mail order  Compliance/Adherence/Medication fill history: Care Gaps: Lung Cancer Screening COLONOSCOPY  Star-Rating Drugs: Losartan 12.5 mg: Last filled 11/24/22 for 90-DS  Pravastatin 20 mg: Last filled 09/29/22 for 90-DS   Assessment/Plan  Heart Failure (Goal: manage symptoms and prevent exacerbations) -Uncontrolled -Last ejection fraction: 45-50% (Date: Jan 2024) -HF type: HFmrEF (mildly reduced EF 41-49%) -NYHA Class: II (slight limitation of activity) -AHA HF Stage: C (Heart disease and symptoms present) -Current treatment: Diltiazem XR 240 mg daily  Losartan 12.5 mg daily  -Medications previously tried: NA  -Continue current medications  Atrial Fibrillation (Goal: prevent stroke and major bleeding) -Controlled -CHADSVASC: 4 -Current treatment: Rate control: Diltiazem XR 240 mg daily Anticoagulation: Eliquis 5 mg twice daily  -Medications previously tried: NA -Recommended to continue current medications   Hyperlipidemia: (LDL goal < 70) -Controlled -Current  treatment: Pravastatin 20 mg daily -Medications previously tried: NA  -Recommended to continue current medication  Diabetes (A1c goal <7%) -Uncontrolled Diagnosed 20+ years ago -Current medications: Farxiga  Tresiba 8 units nightly  -Medications previously tried: International aid/development worker, Januvia, Metformin,   -Current home glucose readings fasting glucose: 111 -Reports hypoglycemic/hyperglycemic symptoms -Current meal patterns: Eating out 2-3 times weekly, struggles with sweets (cookies). -Current exercise patterns: 1x weekly (20 minutes) limited by PVD. -Patient is receiving Farxiga through patient assistance. He is tolerating the medication well. He had CMP rechecked 1-2 weeks after starting [place] and his creatinine/potassium were stable. -Continue current medications   COPD (Goal: control symptoms and prevent exacerbations) -Controlled -Current treatment  Breztri 2 puffs twice daily  -Medications previously tried: NA  -Exacerbations requiring treatment in last 6 months: Yes, hospitalized -Patient reports consistent use of maintenance inhaler. Headache if taking more than 4 days in a row. Seems to be improving the longer he is on the medication.  -Frequency of rescue inhaler use: None -Continue current medications   Tobacco use (Goal Quit smoking) -Uncontrolled -Previous quit attempts: None -Current treatment  1.5-2 ppd  -Patient is in a pre-contemplative stage of action and is not ready to quit.  -Provided contact information for [place] Quit Line (1-800-QUIT-NOW) and encouraged patient to reach out to this group for support.  Chronic Pain (Goal: Minimize pain symptoms) -Controlled -Managed by Dr. Welton Flakes  -Current treatment  Gabapentin 300 1 cap AM, 1 cap Noon, 2 caps PM Tizanidine 2 mg twice daily  Tramadol 50 mg twice daily  -Medications previously tried: NA  -Recommended to continue current medication  Follow Up Plan: Telephone follow up appointment with care management team member  scheduled for: 03/28/23 at 1:00 PM   Garey Ham,  Patsy Baltimore, CPP Clinical Pharmacist Practitioner  [facility] [phone]

## 2022-12-29 ENCOUNTER — Ambulatory Visit: Payer: Self-pay | Admitting: *Deleted

## 2022-12-29 NOTE — Patient Outreach (Signed)
  Care Coordination   Follow Up Visit Note   01/01/2023 Name: JAVYN POLLACK MRN: [id number] DOB: [birth year]  GREGRY SAXON is a 67 y.o. year old male who sees Alba Cory, MD for primary care. I spoke with  Jeri Cos by phone today.  What matters to the patients health and wellness today?   Seen in the PCP office for cellulitis, better now and antibiotics done.  Will follow up with dermatology.    Goals Addressed             This Visit's Progress    Effective management of DM   On track    Care Coordination Interventions: Provided education to patient about basic DM disease process Reviewed medications with patient and discussed importance of medication adherence Discussed plans with patient for ongoing care management follow up and provided patient with direct contact information for care management team Discussed health maintenance, will need assistance with transportation for colonscopy, family lives out of town.  He will notify this care manager when scheduled so resources can be provided. A1C currently 7.6, goal of less than 7         SDOH assessments and interventions completed:  No     Care Coordination Interventions:  Yes, provided   Interventions Today    Flowsheet Row Most Recent Value  Chronic Disease   Chronic disease during today's visit Diabetes  General Interventions   General Interventions Discussed/Reviewed General Interventions Reviewed, Labs, Health Screening, Doctor Visits, Annual Eye Exam, Annual Foot Exam  Labs Hgb A1c every 3 months  [A1C decreased from 7.6 to 7.4]  Doctor Visits Discussed/Reviewed Doctor Visits Reviewed, PCP, Specialist  [facility] appointment 5/15, Cardiology 5/29, podiatry 7/1, PCP on 7/24]  Health Screening Colonoscopy  [Encouraged to reconsider scheduling colonoscopy.  Will have cardiology appointment this month to get clearanc]  PCP/Specialist Visits Compliance with follow-up visit  Education Interventions   Education  Provided Provided Education  Provided Verbal Education On Blood Sugar Monitoring  [blood sugar today is 111]       Follow up plan: Follow up call scheduled for 7/25    Encounter Outcome:  Pt. Visit Completed   Kemper Durie, RN, MSN, [facility] Care Management Care Management Coordinator [phone]

## 2023-01-01 ENCOUNTER — Encounter
Admission: RE | Admit: 2023-01-01 | Discharge: 2023-01-01 | Disposition: A | Discharge status: Home / Self Care | Payer: PPO | Source: Ambulatory Visit | Attending: Cardiology | Admitting: Cardiology

## 2023-01-01 DIAGNOSIS — R0602 Shortness of breath: Secondary | ICD-10-CM | POA: Diagnosis not present

## 2023-01-01 DIAGNOSIS — I2089 Other forms of angina pectoris: Secondary | ICD-10-CM | POA: Diagnosis not present

## 2023-01-01 DIAGNOSIS — I5020 Unspecified systolic (congestive) heart failure: Secondary | ICD-10-CM | POA: Insufficient documentation

## 2023-01-01 MED ORDER — TECHNETIUM TC 99M TETROFOSMIN IV KIT
10.3900 | PACK | Freq: Once | INTRAVENOUS | Status: AC | PRN
  Administered 2023-01-01: 10.39 via INTRAVENOUS

## 2023-01-01 MED ORDER — TECHNETIUM TC 99M TETROFOSMIN IV KIT
32.8100 | PACK | Freq: Once | INTRAVENOUS | Status: AC | PRN
  Administered 2023-01-01: 32.81 via INTRAVENOUS

## 2023-01-01 MED ORDER — REGADENOSON 0.4 MG/5ML IV SOLN
0.4000 mg | Freq: Once | INTRAVENOUS | Status: AC
  Administered 2023-01-01: 0.4 mg via INTRAVENOUS

## 2023-01-02 ENCOUNTER — Ambulatory Visit: Payer: PPO | Admitting: Cardiology

## 2023-01-02 LAB — NM MYOCAR MULTI W/SPECT W/WALL MOTION / EF
Estimated workload: 1
Exercise duration (min): 0 min
Exercise duration (sec): 0 s
LV dias vol: 162 mL (ref 62–150)
LV sys vol: 89 mL
MPHR: 153 {beats}/min
Nuc Stress EF: 45 %
Peak HR: 102 {beats}/min
Percent HR: 66 %
Rest HR: 64 {beats}/min
Rest Nuclear Isotope Dose: 10.4 mCi
SDS: 2
SRS: 10
SSS: 6
ST Depression (mm): 0 mm
Stress Nuclear Isotope Dose: 32.8 mCi
TID: 0.88

## 2023-01-03 NOTE — Progress Notes (Signed)
There was no evidence of significant ischemia noted. Large quantity of artifact was noted. No significant change from prior study.

## 2023-01-08 ENCOUNTER — Other Ambulatory Visit: Payer: Self-pay | Admitting: Family Medicine

## 2023-01-13 ENCOUNTER — Emergency Department: Payer: PPO

## 2023-01-13 ENCOUNTER — Other Ambulatory Visit: Payer: Self-pay

## 2023-01-13 ENCOUNTER — Emergency Department
Admission: EM | Admit: 2023-01-13 | Discharge: 2023-01-13 | Disposition: A | Discharge status: Home / Self Care | Payer: PPO | Attending: Emergency Medicine | Admitting: Emergency Medicine

## 2023-01-13 ENCOUNTER — Encounter: Payer: Self-pay | Admitting: Emergency Medicine

## 2023-01-13 DIAGNOSIS — J441 Chronic obstructive pulmonary disease with (acute) exacerbation: Secondary | ICD-10-CM | POA: Insufficient documentation

## 2023-01-13 DIAGNOSIS — Z7901 Long term (current) use of anticoagulants: Secondary | ICD-10-CM | POA: Insufficient documentation

## 2023-01-13 DIAGNOSIS — R0602 Shortness of breath: Secondary | ICD-10-CM | POA: Diagnosis not present

## 2023-01-13 DIAGNOSIS — I4892 Unspecified atrial flutter: Secondary | ICD-10-CM | POA: Diagnosis not present

## 2023-01-13 DIAGNOSIS — I509 Heart failure, unspecified: Secondary | ICD-10-CM | POA: Insufficient documentation

## 2023-01-13 DIAGNOSIS — J439 Emphysema, unspecified: Secondary | ICD-10-CM | POA: Diagnosis not present

## 2023-01-13 DIAGNOSIS — E119 Type 2 diabetes mellitus without complications: Secondary | ICD-10-CM | POA: Insufficient documentation

## 2023-01-13 DIAGNOSIS — Z1152 Encounter for screening for COVID-19: Secondary | ICD-10-CM | POA: Diagnosis not present

## 2023-01-13 LAB — BASIC METABOLIC PANEL
Anion gap: 17 — ABNORMAL HIGH (ref 5–15)
BUN: 34 mg/dL — ABNORMAL HIGH (ref 8–23)
CO2: 19 mmol/L — ABNORMAL LOW (ref 22–32)
Calcium: 9.2 mg/dL (ref 8.9–10.3)
Chloride: 100 mmol/L (ref 98–111)
Creatinine, Ser: 1.27 mg/dL — ABNORMAL HIGH (ref 0.61–1.24)
GFR, Estimated: >60 mL/min (ref >60)
Glucose, Bld: 153 mg/dL — ABNORMAL HIGH (ref 70–99)
Potassium: 4.3 mmol/L (ref 3.5–5.1)
Sodium: 136 mmol/L (ref 135–145)

## 2023-01-13 LAB — CBC
HCT: 48.4 % (ref 39.0–52.0)
Hemoglobin: 15.8 g/dL (ref 13.0–17.0)
MCH: 30.9 pg (ref 26.0–34.0)
MCHC: 32.6 g/dL (ref 30.0–36.0)
MCV: 94.5 fL (ref 80.0–100.0)
Platelets: 201 10*3/uL (ref 150–400)
RBC: 5.12 MIL/uL (ref 4.22–5.81)
RDW: 13.6 % (ref 11.5–15.5)
WBC: 3.9 10*3/uL — ABNORMAL LOW (ref 4.0–10.5)
nRBC: 0 % (ref 0.0–0.2)

## 2023-01-13 LAB — TROPONIN I (HIGH SENSITIVITY)
Troponin I (High Sensitivity): 19 ng/L — ABNORMAL HIGH (ref <18)
Troponin I (High Sensitivity): 24 ng/L — ABNORMAL HIGH (ref <18)

## 2023-01-13 LAB — SARS CORONAVIRUS 2 BY RT PCR: SARS Coronavirus 2 by RT PCR: NEGATIVE

## 2023-01-13 LAB — HEPATIC FUNCTION PANEL
ALT: 25 U/L (ref 0–44)
AST: 32 U/L (ref 15–41)
Albumin: 4 g/dL (ref 3.5–5.0)
Alkaline Phosphatase: 115 U/L (ref 38–126)
Bilirubin, Direct: 0.6 mg/dL — ABNORMAL HIGH (ref 0.0–0.2)
Indirect Bilirubin: 2 mg/dL — ABNORMAL HIGH (ref 0.3–0.9)
Total Bilirubin: 2.6 mg/dL — ABNORMAL HIGH (ref 0.3–1.2)
Total Protein: 6.9 g/dL (ref 6.5–8.1)

## 2023-01-13 LAB — LIPASE, BLOOD: Lipase: 20 U/L (ref 11–51)

## 2023-01-13 LAB — MAGNESIUM: Magnesium: 2.2 mg/dL (ref 1.7–2.4)

## 2023-01-13 MED ORDER — IPRATROPIUM-ALBUTEROL 0.5-2.5 (3) MG/3ML IN SOLN
3.0000 mL | Freq: Once | RESPIRATORY_TRACT | Status: AC
  Administered 2023-01-13: 3 mL via RESPIRATORY_TRACT
  Filled 2023-01-13: qty 9

## 2023-01-13 MED ORDER — METOPROLOL TARTRATE 5 MG/5ML IV SOLN
5.0000 mg | INTRAVENOUS | Status: AC | PRN
  Administered 2023-01-13 (×3): 5 mg via INTRAVENOUS
  Filled 2023-01-13 (×3): qty 5

## 2023-01-13 MED ORDER — PREDNISONE 20 MG PO TABS: 60.0000 mg | ORAL_TABLET | Freq: Every day | ORAL | 0 refills | Status: AC

## 2023-01-13 MED ORDER — IPRATROPIUM-ALBUTEROL 0.5-2.5 (3) MG/3ML IN SOLN
3.0000 mL | Freq: Once | RESPIRATORY_TRACT | Status: AC
  Administered 2023-01-13: 3 mL via RESPIRATORY_TRACT
  Filled 2023-01-13: qty 3

## 2023-01-13 MED ORDER — PREDNISONE 20 MG PO TABS
60.0000 mg | ORAL_TABLET | Freq: Once | ORAL | Status: AC
  Administered 2023-01-13: 60 mg via ORAL
  Filled 2023-01-13: qty 3

## 2023-01-13 MED ORDER — DILTIAZEM HCL ER COATED BEADS 240 MG PO CP24
240.0000 mg | ORAL_CAPSULE | Freq: Once | ORAL | Status: AC
  Administered 2023-01-13: 240 mg via ORAL
  Filled 2023-01-13: qty 1

## 2023-01-13 MED ORDER — DOXYCYCLINE HYCLATE 100 MG PO CAPS: 100.0000 mg | ORAL_CAPSULE | Freq: Two times a day (BID) | ORAL | 0 refills | Status: AC

## 2023-01-13 NOTE — ED Provider Notes (Signed)
[facility] Provider Note    Event Date/Time   First MD Initiated Contact with Patient 01/13/23 0304     (approximate)   History   Chief Complaint Cough and Shortness of Breath   HPI  Mark Nash is a 66 y.o. male with past medical history of hyperlipidemia, diabetes, atrial fibrillation on Eliquis, CHF, peripheral vascular disease, COPD, hep C, and schizoaffective disorder who presents to the ED complaining of shortness of breath.  Patient reports productive cough and increasing difficulty breathing over the past 2 days, has been using his inhaler at home with partial relief.  He states that it has become harder for him to take his medications at home because he has been "feeling sick" with some nausea.  He denies any chest pain or abdominal pain, has not had any fevers.  He has not noticed any pain or swelling in his legs.     Physical Exam   Triage Vital Signs: ED Triage Vitals  Enc Vitals Group     BP 01/13/23 0250 122/89     Pulse Rate 01/13/23 0250 (!) 136     Resp 01/13/23 0250 19     Temp 01/13/23 0250 97.9 F (36.6 C)     Temp Source 01/13/23 0250 Oral     SpO2 01/13/23 0250 93 %     Weight 01/13/23 0254 141 lb (64 kg)     Height 01/13/23 0254 5\' 11"  (1.803 m)     Head Circumference --      Peak Flow --      Pain Score 01/13/23 0253 0     Pain Loc --      Pain Edu? --      Excl. in GC? --     Most recent vital signs: Vitals:   01/13/23 0530 01/13/23 0534  BP: 132/75 132/75  Pulse: (!) 119   Resp: (!) 22   Temp:    SpO2: 97%     Constitutional: Alert and oriented. Eyes: Conjunctivae are normal. Head: Atraumatic. Nose: No congestion/rhinnorhea. Mouth/Throat: Mucous membranes are moist.  Cardiovascular: Tachycardic, irregularly irregular rhythm. Grossly normal heart sounds.  2+ radial pulses bilaterally. Respiratory: Normal respiratory effort.  No retractions. Lungs with expiratory wheezing throughout. Gastrointestinal:  Soft and nontender. No distention. Musculoskeletal: No lower extremity tenderness nor edema.  Neurologic:  Normal speech and language. No gross focal neurologic deficits are appreciated.    ED Results / Procedures / Treatments   Labs (all labs ordered are listed, but only abnormal results are displayed) Labs Reviewed  BASIC METABOLIC PANEL - Abnormal; Notable for the following components:      Result Value   CO2 19 (*)    Glucose, Bld 153 (*)    BUN 34 (*)    Creatinine, Ser 1.27 (*)    Anion gap 17 (*)    All other components within normal limits  CBC - Abnormal; Notable for the following components:   WBC 3.9 (*)    All other components within normal limits  HEPATIC FUNCTION PANEL - Abnormal; Notable for the following components:   Total Bilirubin 2.6 (*)    Bilirubin, Direct 0.6 (*)    Indirect Bilirubin 2.0 (*)    All other components within normal limits  TROPONIN I (HIGH SENSITIVITY) - Abnormal; Notable for the following components:   Troponin I (High Sensitivity) 19 (*)    All other components within normal limits  TROPONIN I (HIGH SENSITIVITY) - Abnormal; Notable for the  following components:   Troponin I (High Sensitivity) 24 (*)    All other components within normal limits  SARS CORONAVIRUS 2 BY RT PCR  MAGNESIUM  LIPASE, BLOOD     EKG  ED ECG REPORT I, Chesley Noon, the attending physician, personally viewed and interpreted this ECG.   Date: 01/13/2023  EKG Time: 2:51  Rate: 130  Rhythm: Atrial flutter with variable block  Axis: Normal  Intervals:none  ST&T Change: None  RADIOLOGY Chest x-ray reviewed and interpreted by me with no infiltrate, edema, or effusion.  PROCEDURES:  Critical Care performed: No  Procedures   MEDICATIONS ORDERED IN ED: Medications  metoprolol tartrate (LOPRESSOR) injection 5 mg (5 mg Intravenous Given 01/13/23 0557)  predniSONE (DELTASONE) tablet 60 mg (60 mg Oral Given 01/13/23 0440)  ipratropium-albuterol (DUONEB)  0.5-2.5 (3) MG/3ML nebulizer solution 3 mL (3 mLs Nebulization Given 01/13/23 0440)  diltiazem (CARDIZEM CD) 24 hr capsule 240 mg (240 mg Oral Given 01/13/23 0534)  ipratropium-albuterol (DUONEB) 0.5-2.5 (3) MG/3ML nebulizer solution 3 mL (3 mLs Nebulization Given 01/13/23 0557)     IMPRESSION / MDM / ASSESSMENT AND PLAN / ED COURSE  I reviewed the triage vital signs and the nursing notes.                              67 y.o. male with past medical history of hyperlipidemia, diabetes, atrial fibrillation on Eliquis, PVD, CHF, hep C, COPD, and schizoaffective disorder who presents to the ED with cough, difficulty breathing, nausea, and malaise increasing over the past 2 days.  Patient's presentation is most consistent with acute presentation with potential threat to life or bodily function.  Differential diagnosis includes, but is not limited to, COPD, pneumonia, CHF, ACS, PE, AKI, electrolyte abnormality, arrhythmia, viral illness.  Patient nontoxic-appearing and in no acute distress, vital signs remarkable for tachycardia but otherwise reassuring.  He is not in any respiratory distress and maintaining oxygen saturations on room air, does have some wheezing on exam.  EKG shows atrial flutter with variable block, no ischemic changes noted and patient denies any symptoms concerning for ACS or PE.  Patient likely with COPD exacerbation, chest x-ray shows no evidence of pneumonia or CHF.  We will give IV metoprolol for rate control, also treat COPD with prednisone and DuoNeb.  Patient without significant anemia, leukocytosis, lecture abnormality, or AKI.  He does have mild elevation in bilirubin but doubt biliary obstruction given no upper abdominal pain and he has an indirect bilirubinemia.  Troponin mildly elevated and we will trend.  Repeat troponin stable compared to previous, heart rate gradually improving after IV metoprolol and his usual home dose of diltiazem.  Heart rate now consistently 100-110  and patient reports improvement in breathing following DuoNeb's and dose of prednisone.  Patient noted to have persistent atrial fibrillation/flutter previously and is appropriate for outpatient management with cardiology and PCP follow-up.  He was counseled to continue his rescue inhaler, will prescribe prednisone and antibiotics given his purulent sputum production.  He was counseled to return to the ED for new or worsening symptoms, patient agrees with plan.      FINAL CLINICAL IMPRESSION(S) / ED DIAGNOSES   Final diagnoses:  COPD exacerbation (HCC)  Atrial flutter, unspecified type (HCC)     Rx / DC Orders   ED Discharge Orders          Ordered    Ambulatory referral to Cardiology  01/13/23 0625    predniSONE (DELTASONE) 20 MG tablet  Daily with breakfast        01/13/23 0626    doxycycline (VIBRAMYCIN) 100 MG capsule  2 times daily        01/13/23 1610             Note:  This document was prepared using Dragon voice recognition software and may include unintentional dictation errors.   Chesley Noon, MD 01/13/23 [phone]

## 2023-01-13 NOTE — ED Notes (Signed)
Pt A&O x4, no obvious distress noted, respirations regular/unlabored. Pt verbalizes understanding of discharge instructions. Pt able to ambulate from ED independently.   

## 2023-01-13 NOTE — ED Triage Notes (Signed)
Pt presents ambulatory to triage via POV with complaints of cough for the last several days with associated decreased appetite and abdominal pain. Hx of afib and COPD. Pt notes not being able to take his medications due to decreased appetite and nausea. A&Ox4 at this time. Denies CP.

## 2023-01-21 ENCOUNTER — Inpatient Hospital Stay
Admission: EM | Admit: 2023-01-21 | Discharge: 2023-01-31 | DRG: 190 | Disposition: A | Discharge status: Home / Self Care | Payer: PPO | Attending: Student | Admitting: Student

## 2023-01-21 ENCOUNTER — Other Ambulatory Visit: Payer: Self-pay

## 2023-01-21 ENCOUNTER — Emergency Department: Payer: PPO

## 2023-01-21 DIAGNOSIS — Z720 Tobacco use: Secondary | ICD-10-CM

## 2023-01-21 DIAGNOSIS — L89152 Pressure ulcer of sacral region, stage 2: Secondary | ICD-10-CM | POA: Diagnosis not present

## 2023-01-21 DIAGNOSIS — I5043 Acute on chronic combined systolic (congestive) and diastolic (congestive) heart failure: Secondary | ICD-10-CM | POA: Diagnosis not present

## 2023-01-21 DIAGNOSIS — Z7901 Long term (current) use of anticoagulants: Secondary | ICD-10-CM | POA: Diagnosis not present

## 2023-01-21 DIAGNOSIS — E1165 Type 2 diabetes mellitus with hyperglycemia: Secondary | ICD-10-CM | POA: Diagnosis not present

## 2023-01-21 DIAGNOSIS — Z888 Allergy status to other drugs, medicaments and biological substances status: Secondary | ICD-10-CM

## 2023-01-21 DIAGNOSIS — B9789 Other viral agents as the cause of diseases classified elsewhere: Secondary | ICD-10-CM | POA: Diagnosis not present

## 2023-01-21 DIAGNOSIS — K5900 Constipation, unspecified: Secondary | ICD-10-CM | POA: Diagnosis not present

## 2023-01-21 DIAGNOSIS — E785 Hyperlipidemia, unspecified: Secondary | ICD-10-CM | POA: Diagnosis not present

## 2023-01-21 DIAGNOSIS — I10 Essential (primary) hypertension: Secondary | ICD-10-CM

## 2023-01-21 DIAGNOSIS — J441 Chronic obstructive pulmonary disease with (acute) exacerbation: Principal | ICD-10-CM

## 2023-01-21 DIAGNOSIS — I509 Heart failure, unspecified: Secondary | ICD-10-CM

## 2023-01-21 DIAGNOSIS — Z7951 Long term (current) use of inhaled steroids: Secondary | ICD-10-CM

## 2023-01-21 DIAGNOSIS — I110 Hypertensive heart disease with heart failure: Secondary | ICD-10-CM | POA: Diagnosis not present

## 2023-01-21 DIAGNOSIS — L8990 Pressure ulcer of unspecified site, unspecified stage: Secondary | ICD-10-CM | POA: Insufficient documentation

## 2023-01-21 DIAGNOSIS — Z1152 Encounter for screening for COVID-19: Secondary | ICD-10-CM | POA: Diagnosis not present

## 2023-01-21 DIAGNOSIS — J9601 Acute respiratory failure with hypoxia: Secondary | ICD-10-CM | POA: Diagnosis not present

## 2023-01-21 DIAGNOSIS — I5042 Chronic combined systolic (congestive) and diastolic (congestive) heart failure: Secondary | ICD-10-CM

## 2023-01-21 DIAGNOSIS — K219 Gastro-esophageal reflux disease without esophagitis: Secondary | ICD-10-CM | POA: Diagnosis present

## 2023-01-21 DIAGNOSIS — M5136 Other intervertebral disc degeneration, lumbar region: Secondary | ICD-10-CM | POA: Diagnosis present

## 2023-01-21 DIAGNOSIS — I5021 Acute systolic (congestive) heart failure: Secondary | ICD-10-CM

## 2023-01-21 DIAGNOSIS — I428 Other cardiomyopathies: Secondary | ICD-10-CM | POA: Diagnosis present

## 2023-01-21 DIAGNOSIS — I5022 Chronic systolic (congestive) heart failure: Secondary | ICD-10-CM | POA: Diagnosis present

## 2023-01-21 DIAGNOSIS — R059 Cough, unspecified: Secondary | ICD-10-CM | POA: Diagnosis not present

## 2023-01-21 DIAGNOSIS — Z8249 Family history of ischemic heart disease and other diseases of the circulatory system: Secondary | ICD-10-CM | POA: Diagnosis not present

## 2023-01-21 DIAGNOSIS — B348 Other viral infections of unspecified site: Secondary | ICD-10-CM | POA: Diagnosis not present

## 2023-01-21 DIAGNOSIS — Z886 Allergy status to analgesic agent status: Secondary | ICD-10-CM

## 2023-01-21 DIAGNOSIS — I272 Pulmonary hypertension, unspecified: Secondary | ICD-10-CM | POA: Diagnosis not present

## 2023-01-21 DIAGNOSIS — I5023 Acute on chronic systolic (congestive) heart failure: Secondary | ICD-10-CM | POA: Diagnosis not present

## 2023-01-21 DIAGNOSIS — I5020 Unspecified systolic (congestive) heart failure: Secondary | ICD-10-CM | POA: Diagnosis not present

## 2023-01-21 DIAGNOSIS — G2581 Restless legs syndrome: Secondary | ICD-10-CM | POA: Diagnosis present

## 2023-01-21 DIAGNOSIS — Z880 Allergy status to penicillin: Secondary | ICD-10-CM

## 2023-01-21 DIAGNOSIS — I4892 Unspecified atrial flutter: Secondary | ICD-10-CM | POA: Diagnosis not present

## 2023-01-21 DIAGNOSIS — I2510 Atherosclerotic heart disease of native coronary artery without angina pectoris: Secondary | ICD-10-CM | POA: Diagnosis present

## 2023-01-21 DIAGNOSIS — E871 Hypo-osmolality and hyponatremia: Secondary | ICD-10-CM | POA: Diagnosis not present

## 2023-01-21 DIAGNOSIS — E1142 Type 2 diabetes mellitus with diabetic polyneuropathy: Secondary | ICD-10-CM | POA: Diagnosis not present

## 2023-01-21 DIAGNOSIS — Z79899 Other long term (current) drug therapy: Secondary | ICD-10-CM

## 2023-01-21 DIAGNOSIS — F200 Paranoid schizophrenia: Secondary | ICD-10-CM | POA: Diagnosis present

## 2023-01-21 DIAGNOSIS — E1151 Type 2 diabetes mellitus with diabetic peripheral angiopathy without gangrene: Secondary | ICD-10-CM | POA: Diagnosis present

## 2023-01-21 DIAGNOSIS — I739 Peripheral vascular disease, unspecified: Secondary | ICD-10-CM | POA: Diagnosis not present

## 2023-01-21 DIAGNOSIS — Z794 Long term (current) use of insulin: Secondary | ICD-10-CM | POA: Diagnosis not present

## 2023-01-21 DIAGNOSIS — R079 Chest pain, unspecified: Secondary | ICD-10-CM

## 2023-01-21 DIAGNOSIS — I2089 Other forms of angina pectoris: Secondary | ICD-10-CM

## 2023-01-21 DIAGNOSIS — E559 Vitamin D deficiency, unspecified: Secondary | ICD-10-CM | POA: Diagnosis present

## 2023-01-21 DIAGNOSIS — S2231XA Fracture of one rib, right side, initial encounter for closed fracture: Secondary | ICD-10-CM | POA: Diagnosis not present

## 2023-01-21 DIAGNOSIS — I4821 Permanent atrial fibrillation: Secondary | ICD-10-CM | POA: Diagnosis present

## 2023-01-21 DIAGNOSIS — F17210 Nicotine dependence, cigarettes, uncomplicated: Secondary | ICD-10-CM | POA: Diagnosis present

## 2023-01-21 DIAGNOSIS — R0602 Shortness of breath: Secondary | ICD-10-CM | POA: Diagnosis not present

## 2023-01-21 DIAGNOSIS — I4819 Other persistent atrial fibrillation: Secondary | ICD-10-CM

## 2023-01-21 DIAGNOSIS — Z833 Family history of diabetes mellitus: Secondary | ICD-10-CM

## 2023-01-21 LAB — CBC WITH DIFFERENTIAL/PLATELET
Abs Immature Granulocytes: 0.04 10*3/uL (ref 0.00–0.07)
Basophils Absolute: 0 10*3/uL (ref 0.0–0.1)
Basophils Relative: 0 %
Eosinophils Absolute: 0.1 10*3/uL (ref 0.0–0.5)
Eosinophils Relative: 1 %
HCT: 44.8 % (ref 39.0–52.0)
Hemoglobin: 14.7 g/dL (ref 13.0–17.0)
Immature Granulocytes: 1 %
Lymphocytes Relative: 20 %
Lymphs Abs: 1.5 10*3/uL (ref 0.7–4.0)
MCH: 30.6 pg (ref 26.0–34.0)
MCHC: 32.8 g/dL (ref 30.0–36.0)
MCV: 93.1 fL (ref 80.0–100.0)
Monocytes Absolute: 0.7 10*3/uL (ref 0.1–1.0)
Monocytes Relative: 9 %
Neutro Abs: 5.2 10*3/uL (ref 1.7–7.7)
Neutrophils Relative %: 69 %
Platelets: 196 10*3/uL (ref 150–400)
RBC: 4.81 MIL/uL (ref 4.22–5.81)
RDW: 13.3 % (ref 11.5–15.5)
WBC: 7.5 10*3/uL (ref 4.0–10.5)
nRBC: 0 % (ref 0.0–0.2)

## 2023-01-21 LAB — GLUCOSE, CAPILLARY
Glucose-Capillary: 298 mg/dL — ABNORMAL HIGH (ref 70–99)
Glucose-Capillary: 214 mg/dL — ABNORMAL HIGH (ref 70–99)

## 2023-01-21 LAB — RESP PANEL BY RT-PCR (RSV, FLU A&B, COVID)  RVPGX2
Influenza A by PCR: NEGATIVE
Influenza B by PCR: NEGATIVE
Resp Syncytial Virus by PCR: NEGATIVE
SARS Coronavirus 2 by RT PCR: NEGATIVE

## 2023-01-21 LAB — COMPREHENSIVE METABOLIC PANEL
ALT: 31 U/L (ref 0–44)
AST: 28 U/L (ref 15–41)
Albumin: 3.8 g/dL (ref 3.5–5.0)
Alkaline Phosphatase: 95 U/L (ref 38–126)
Anion gap: 7 (ref 5–15)
BUN: 21 mg/dL (ref 8–23)
CO2: 29 mmol/L (ref 22–32)
Calcium: 8.5 mg/dL — ABNORMAL LOW (ref 8.9–10.3)
Chloride: 101 mmol/L (ref 98–111)
Creatinine, Ser: 0.74 mg/dL (ref 0.61–1.24)
GFR, Estimated: >60 mL/min (ref >60)
Glucose, Bld: 92 mg/dL (ref 70–99)
Potassium: 3.8 mmol/L (ref 3.5–5.1)
Sodium: 137 mmol/L (ref 135–145)
Total Bilirubin: 1.3 mg/dL — ABNORMAL HIGH (ref 0.3–1.2)
Total Protein: 6.2 g/dL — ABNORMAL LOW (ref 6.5–8.1)

## 2023-01-21 LAB — BLOOD GAS, VENOUS
Acid-Base Excess: 4.3 mmol/L — ABNORMAL HIGH (ref 0.0–2.0)
Acid-Base Excess: 1.2 mmol/L (ref 0.0–2.0)
Bicarbonate: 31.8 mmol/L — ABNORMAL HIGH (ref 20.0–28.0)
Bicarbonate: 28.7 mmol/L — ABNORMAL HIGH (ref 20.0–28.0)
O2 Saturation: 64.6 %
O2 Saturation: 73.6 %
Patient temperature: 37
Patient temperature: 37
pCO2, Ven: 59 mmHg (ref 44–60)
pCO2, Ven: 57 mmHg (ref 44–60)
pH, Ven: 7.34 (ref 7.25–7.43)
pH, Ven: 7.31 (ref 7.25–7.43)
pO2, Ven: 44 mmHg (ref 32–45)
pO2, Ven: 46 mmHg — ABNORMAL HIGH (ref 32–45)

## 2023-01-21 LAB — RETICULOCYTES
Immature Retic Fract: 11.5 % (ref 2.3–15.9)
RBC.: 4.63 MIL/uL (ref 4.22–5.81)
Retic Count, Absolute: 57.9 10*3/uL (ref 19.0–186.0)
Retic Ct Pct: 1.3 % (ref 0.4–3.1)

## 2023-01-21 LAB — IRON AND TIBC
Iron: 61 ug/dL (ref 45–182)
Saturation Ratios: 20 % (ref 17.9–39.5)
TIBC: 314 ug/dL (ref 250–450)
UIBC: 253 ug/dL

## 2023-01-21 LAB — FERRITIN: Ferritin: 97 ng/mL (ref 24–336)

## 2023-01-21 LAB — HEMOGLOBIN
Hemoglobin: 14.1 g/dL (ref 13.0–17.0)
Hemoglobin: 14.7 g/dL (ref 13.0–17.0)
Hemoglobin: 14.2 g/dL (ref 13.0–17.0)

## 2023-01-21 LAB — FOLATE: Folate: 18.6 ng/mL (ref >5.9)

## 2023-01-21 LAB — VITAMIN B12: Vitamin B-12: 240 pg/mL (ref 180–914)

## 2023-01-21 LAB — D-DIMER, QUANTITATIVE: D-Dimer, Quant: <0.27 ug/mL-FEU (ref 0.00–0.50)

## 2023-01-21 LAB — BRAIN NATRIURETIC PEPTIDE: B Natriuretic Peptide: 19.7 pg/mL (ref 0.0–100.0)

## 2023-01-21 LAB — TSH: TSH: 0.564 u[IU]/mL (ref 0.350–4.500)

## 2023-01-21 MED ORDER — DIGOXIN 0.25 MG/ML IJ SOLN
0.2500 mg | Freq: Once | INTRAMUSCULAR | Status: AC
  Administered 2023-01-21: 0.25 mg via INTRAVENOUS
  Filled 2023-01-21: qty 2

## 2023-01-21 MED ORDER — DILTIAZEM HCL-DEXTROSE 125-5 MG/125ML-% IV SOLN (PREMIX)
5.0000 mg/h | INTRAVENOUS | Status: DC
  Administered 2023-01-21: 5 mg/h via INTRAVENOUS
  Administered 2023-01-21: 15 mg/h via INTRAVENOUS
  Administered 2023-01-22 (×2): 12.5 mg/h via INTRAVENOUS
  Administered 2023-01-23: 5 mg/h via INTRAVENOUS
  Filled 2023-01-21 (×5): qty 125

## 2023-01-21 MED ORDER — IPRATROPIUM-ALBUTEROL 0.5-2.5 (3) MG/3ML IN SOLN
3.0000 mL | Freq: Once | RESPIRATORY_TRACT | Status: AC
  Administered 2023-01-21: 3 mL via RESPIRATORY_TRACT
  Filled 2023-01-21: qty 3

## 2023-01-21 MED ORDER — PRAVASTATIN SODIUM 20 MG PO TABS
20.0000 mg | ORAL_TABLET | Freq: Every day | ORAL | Status: DC
  Administered 2023-01-21 – 2023-01-30 (×10): 20 mg via ORAL
  Filled 2023-01-21 (×10): qty 1

## 2023-01-21 MED ORDER — ONDANSETRON HCL 4 MG/2ML IJ SOLN
4.0000 mg | Freq: Four times a day (QID) | INTRAMUSCULAR | Status: DC | PRN
  Administered 2023-01-25 – 2023-01-30 (×4): 4 mg via INTRAVENOUS
  Filled 2023-01-21 (×4): qty 2

## 2023-01-21 MED ORDER — DAPAGLIFLOZIN PROPANEDIOL 10 MG PO TABS
10.0000 mg | ORAL_TABLET | Freq: Every day | ORAL | Status: DC
  Administered 2023-01-21 – 2023-01-31 (×10): 10 mg via ORAL
  Filled 2023-01-21 (×11): qty 1

## 2023-01-21 MED ORDER — INSULIN DETEMIR 100 UNIT/ML ~~LOC~~ SOLN
8.0000 [IU] | Freq: Every day | SUBCUTANEOUS | Status: DC
  Administered 2023-01-21 – 2023-01-30 (×10): 8 [IU] via SUBCUTANEOUS
  Filled 2023-01-21 (×11): qty 0.08

## 2023-01-21 MED ORDER — GUAIFENESIN ER 600 MG PO TB12
600.0000 mg | ORAL_TABLET | Freq: Two times a day (BID) | ORAL | Status: DC | PRN
  Administered 2023-01-21 – 2023-01-25 (×4): 600 mg via ORAL
  Filled 2023-01-21 (×4): qty 1

## 2023-01-21 MED ORDER — DOXYCYCLINE HYCLATE 100 MG PO TABS
100.0000 mg | ORAL_TABLET | Freq: Two times a day (BID) | ORAL | Status: DC
  Administered 2023-01-21 (×2): 100 mg via ORAL
  Filled 2023-01-21 (×2): qty 1

## 2023-01-21 MED ORDER — IPRATROPIUM-ALBUTEROL 0.5-2.5 (3) MG/3ML IN SOLN
3.0000 mL | Freq: Four times a day (QID) | RESPIRATORY_TRACT | Status: DC
  Administered 2023-01-21 (×3): 3 mL via RESPIRATORY_TRACT
  Filled 2023-01-21 (×3): qty 3

## 2023-01-21 MED ORDER — DILTIAZEM HCL ER COATED BEADS 240 MG PO CP24
240.0000 mg | ORAL_CAPSULE | Freq: Every day | ORAL | Status: DC
  Administered 2023-01-21 – 2023-01-26 (×6): 240 mg via ORAL
  Filled 2023-01-21: qty 2
  Filled 2023-01-21 (×2): qty 1
  Filled 2023-01-21 (×2): qty 2
  Filled 2023-01-21 (×4): qty 1
  Filled 2023-01-21 (×2): qty 2

## 2023-01-21 MED ORDER — LEVALBUTEROL HCL 0.63 MG/3ML IN NEBU: 0.6300 mg | INHALATION_SOLUTION | Freq: Four times a day (QID) | RESPIRATORY_TRACT | Status: DC | PRN

## 2023-01-21 MED ORDER — ALBUTEROL SULFATE (2.5 MG/3ML) 0.083% IN NEBU
2.5000 mg | INHALATION_SOLUTION | Freq: Once | RESPIRATORY_TRACT | Status: AC
  Administered 2023-01-21: 2.5 mg via RESPIRATORY_TRACT
  Filled 2023-01-21: qty 3

## 2023-01-21 MED ORDER — ONDANSETRON HCL 4 MG PO TABS
4.0000 mg | ORAL_TABLET | Freq: Four times a day (QID) | ORAL | Status: DC | PRN
  Administered 2023-01-24: 4 mg via ORAL
  Filled 2023-01-21: qty 1

## 2023-01-21 MED ORDER — GABAPENTIN 300 MG PO CAPS
300.0000 mg | ORAL_CAPSULE | Freq: Every morning | ORAL | Status: DC
  Administered 2023-01-22 – 2023-01-31 (×9): 300 mg via ORAL
  Filled 2023-01-21 (×9): qty 1

## 2023-01-21 MED ORDER — GABAPENTIN 300 MG PO CAPS
600.0000 mg | ORAL_CAPSULE | Freq: Every day | ORAL | Status: DC
  Administered 2023-01-21 – 2023-01-30 (×10): 600 mg via ORAL
  Filled 2023-01-21 (×10): qty 2

## 2023-01-21 MED ORDER — INSULIN ASPART 100 UNIT/ML IJ SOLN
0.0000 [IU] | Freq: Three times a day (TID) | INTRAMUSCULAR | Status: DC
  Administered 2023-01-21: 5 [IU] via SUBCUTANEOUS
  Administered 2023-01-22: 1 [IU] via SUBCUTANEOUS
  Administered 2023-01-22: 5 [IU] via SUBCUTANEOUS
  Administered 2023-01-22: 2 [IU] via SUBCUTANEOUS
  Administered 2023-01-23: 3 [IU] via SUBCUTANEOUS
  Administered 2023-01-23: 2 [IU] via SUBCUTANEOUS
  Administered 2023-01-23: 3 [IU] via SUBCUTANEOUS
  Administered 2023-01-24: 1 [IU] via SUBCUTANEOUS
  Administered 2023-01-24: 3 [IU] via SUBCUTANEOUS
  Administered 2023-01-25: 5 [IU] via SUBCUTANEOUS
  Administered 2023-01-25: 1 [IU] via SUBCUTANEOUS
  Administered 2023-01-26 – 2023-01-27 (×5): 2 [IU] via SUBCUTANEOUS
  Administered 2023-01-27: 3 [IU] via SUBCUTANEOUS
  Administered 2023-01-28: 2 [IU] via SUBCUTANEOUS
  Administered 2023-01-28: 3 [IU] via SUBCUTANEOUS
  Administered 2023-01-29 – 2023-01-31 (×5): 2 [IU] via SUBCUTANEOUS
  Filled 2023-01-21 (×25): qty 1

## 2023-01-21 MED ORDER — PANTOPRAZOLE SODIUM 40 MG IV SOLR
40.0000 mg | Freq: Two times a day (BID) | INTRAVENOUS | Status: DC
  Administered 2023-01-21 – 2023-01-23 (×5): 40 mg via INTRAVENOUS
  Filled 2023-01-21 (×5): qty 10

## 2023-01-21 MED ORDER — MAGNESIUM SULFATE 2 GM/50ML IV SOLN
2.0000 g | Freq: Once | INTRAVENOUS | Status: AC
  Administered 2023-01-21: 2 g via INTRAVENOUS
  Filled 2023-01-21: qty 50

## 2023-01-21 MED ORDER — GABAPENTIN 300 MG PO CAPS
300.0000 mg | ORAL_CAPSULE | Freq: Every day | ORAL | Status: DC
  Administered 2023-01-21 – 2023-01-31 (×10): 300 mg via ORAL
  Filled 2023-01-21 (×10): qty 1

## 2023-01-21 MED ORDER — METHYLPREDNISOLONE SODIUM SUCC 40 MG IJ SOLR
40.0000 mg | Freq: Two times a day (BID) | INTRAMUSCULAR | Status: AC
  Administered 2023-01-21 (×2): 40 mg via INTRAVENOUS
  Filled 2023-01-21 (×2): qty 1

## 2023-01-21 MED ORDER — SODIUM CHLORIDE 0.9 % IV BOLUS
500.0000 mL | Freq: Once | INTRAVENOUS | Status: AC
  Administered 2023-01-21: 500 mL via INTRAVENOUS

## 2023-01-21 MED ORDER — FLUTICASONE FUROATE-VILANTEROL 200-25 MCG/ACT IN AEPB
1.0000 | INHALATION_SPRAY | Freq: Every day | RESPIRATORY_TRACT | Status: DC
  Administered 2023-01-22 – 2023-01-31 (×9): 1 via RESPIRATORY_TRACT
  Filled 2023-01-21 (×2): qty 28

## 2023-01-21 MED ORDER — TRAMADOL HCL 50 MG PO TABS
50.0000 mg | ORAL_TABLET | Freq: Two times a day (BID) | ORAL | Status: DC
  Administered 2023-01-21 – 2023-01-31 (×20): 50 mg via ORAL
  Filled 2023-01-21 (×20): qty 1

## 2023-01-21 MED ORDER — BUDESON-GLYCOPYRROL-FORMOTEROL 160-9-4.8 MCG/ACT IN AERO: 2.0000 | INHALATION_SPRAY | Freq: Two times a day (BID) | RESPIRATORY_TRACT | Status: DC

## 2023-01-21 MED ORDER — DILTIAZEM HCL 25 MG/5ML IV SOLN
10.0000 mg | Freq: Once | INTRAVENOUS | Status: AC
  Administered 2023-01-21: 10 mg via INTRAVENOUS
  Filled 2023-01-21: qty 5

## 2023-01-21 MED ORDER — BENZONATATE 100 MG PO CAPS
100.0000 mg | ORAL_CAPSULE | Freq: Once | ORAL | Status: AC
  Administered 2023-01-21: 100 mg via ORAL
  Filled 2023-01-21: qty 1

## 2023-01-21 MED ORDER — GABAPENTIN 300 MG PO CAPS: 300.0000 mg | ORAL_CAPSULE | ORAL | Status: DC

## 2023-01-21 MED ORDER — PREDNISONE 20 MG PO TABS
40.0000 mg | ORAL_TABLET | Freq: Every day | ORAL | Status: AC
  Administered 2023-01-22 – 2023-01-25 (×4): 40 mg via ORAL
  Filled 2023-01-21: qty 2
  Filled 2023-01-21: qty 4
  Filled 2023-01-21 (×2): qty 2

## 2023-01-21 MED ORDER — METHYLPREDNISOLONE SODIUM SUCC 125 MG IJ SOLR
125.0000 mg | Freq: Once | INTRAMUSCULAR | Status: AC
  Administered 2023-01-21: 125 mg via INTRAVENOUS
  Filled 2023-01-21: qty 2

## 2023-01-21 MED ORDER — UMECLIDINIUM BROMIDE 62.5 MCG/ACT IN AEPB
1.0000 | INHALATION_SPRAY | Freq: Every day | RESPIRATORY_TRACT | Status: DC
  Administered 2023-01-22 – 2023-01-31 (×9): 1 via RESPIRATORY_TRACT
  Filled 2023-01-21 (×3): qty 7

## 2023-01-21 NOTE — H&P (Signed)
History and Physical    KEMARI BALBIN ZOX:[id number] DOB: 1956-06-02 DOA: 01/21/2023  PCP: Alba Cory, MD  Patient coming from: home  I have personally briefly reviewed patient's old medical records in [facility]  Chief Complaint:  sob  HPI: Mark Nash is a 67 y.o. male with medical history significant of  hypertension,HLD, type 2 diabetes, PAD, COPD/asthma not O2 dependent, atrial fibrillation on chronic anticoagulation with apixaban, CHFef 40-45%, hepc C, schizoaffective/schizophrenia disorder, tobacco use, who has interim history of visit to ED on 5/18 with similar complaints of sob. At that time patient was  diagnosed with COPD exacerbation. Patient was treated in ED and discharge home on  prednisone taper and antibiotics. Patient now returns with progression of symptoms despite out patient tx with abx and steroids. Per patient s/p discharge from ED he initial feel improved. However after he completed steroids and antibiotic course he noted he became progressively more sob. He notes symptoms acutely got worse 1 days prior to presentation. He denies any associated, fever/chills/chest pain / abdominal pain. He notes intermittent , nausea, cough /congestion minimally productive. He also noted one episode of dark stools 3 days ago. He has not had bowel movement since that time. She also note constipation and increasing fatigue and difficulty with ADLS.  ED Course:  Afeb, bp 142/116, hr 140, rr 26, sat 89% on ra  EKG: atrial flutter with rvr 118 Wbc 7.5, hgb 14.7, plt 196,  Resp neg  Bn p 19.7 Cxr: IMPRESSION: Chronic interstitial coarsening without acute cardiopulmonary findings.  Vbg : 7/34/59 Tx douneb, cardizem, dilt drip   NA 137, K 3.8, cr 0.74,   In ed patient noted to be hypoxic to 89% was placed on [place] with improvement to 98% , he was also noted to be in atrial flutter with rvr which required initiation of cardizem drip.  Review of Systems: As per HPI  otherwise 10 point review of systems negative.   Past Medical History:  Diagnosis Date   Arthritis    Asthma    Benign neoplasm of ascending colon    polyps   Constipation    COPD (chronic obstructive pulmonary disease) (HCC)    NO INHALER   Diabetes mellitus without complication (HCC)    Type 2   Diverticulosis    Dyspnea    RARE-WITH EXERTION DUE TO COPD   GERD (gastroesophageal reflux disease)    Hepatitis C    History of kidney stones    H/O   Hyperlipidemia    Hypertension 06/10/2018   currently not under control   Left inguinal hernia    Peripheral vascular disease (HCC)    Restless legs    Schizoaffective disorder (HCC)    Pt denies   Thrombocytopenia (HCC)    Vitamin D deficiency    Wears dentures    full upper and lower    Past Surgical History:  Procedure Laterality Date   BACK SURGERY     CARDIOVERSION N/A 05/20/2021   Procedure: CARDIOVERSION;  Surgeon: Iran Ouch, MD;  Location: [facility] ORS;  Service: Cardiovascular;  Laterality: N/A;   CATARACT EXTRACTION W/PHACO Right 10/31/2021   Procedure: CATARACT EXTRACTION PHACO AND INTRAOCULAR LENS PLACEMENT (IOC) RIGHT DIABETIC;  Surgeon: Nevada Crane, MD;  Location: [facility];  Service: Ophthalmology;  Laterality: Right;  Diabetic 4.34 00:42.8   COLONOSCOPY WITH PROPOFOL N/A 09/07/2017   Procedure: COLONOSCOPY WITH PROPOFOL;  Surgeon: Pasty Spillers, MD;  Location: [facility];  Service: Endoscopy;  Laterality: N/A;   ESOPHAGOGASTRODUODENOSCOPY (EGD) WITH PROPOFOL N/A 12/07/2017   Procedure: ESOPHAGOGASTRODUODENOSCOPY (EGD) WITH PROPOFOL;  Surgeon: Pasty Spillers, MD;  Location: [facility] END[facility]COPY;  Service: Endoscopy;  Laterality: N/A;   EYE SURGERY     FLEXIBLE SIGMOIDOSCOPY N/A 11/09/2017   Procedure: FLEXIBLE SIGMOIDOSCOPY;  Surgeon: Pasty Spillers, MD;  Location: [facility] END[facility]COPY;  Service: Endoscopy;  Laterality: N/A;   HERNIA REPAIR     inguinal   INGUINAL HERNIA  REPAIR Bilateral 06/21/2018   Procedure: LAPAROSCOPIC BILATERAL INGUINAL HERNIA REPAIR;  Surgeon: Ancil Linsey, MD;  Location: [facility] OR[facility];  Service: General;  Laterality: Bilateral;   L3 TO L5 LAMINECTOMY FOR DECOMPRESSION  07/17/2016   [facility]   REPAIR OF CEREBROSPINAL FLUID LEAK N/A 09/08/2016   Procedure: Lumbar wound exploration, repair of pseudomenigocele;  Surgeon: Loura Halt Ditty, MD;  Location: [facility] OR;  Service: Neurosurgery;  Laterality: N/A;  Lumbar wound exploration, repair of pseudomenigocele   SINUSOTOMY       reports that he has been smoking cigarettes. He has a 69.00 pack-year smoking history. He has never used smokeless tobacco. He reports that he does not drink alcohol and does not use drugs.  Allergies  Allergen Reactions   Penicillins Hives and Swelling    As a child. Has patient had a PCN reaction causing immediate rash, facial/tongue/throat swelling, SOB or lightheadedness with hypotension: YES  Has patient had a PCN reaction causing severe rash involving mucus membranes or skin necrosis: UNKNOWN Has patient had a PCN reaction that required hospitalization: UNKNOWN Has patient had a PCN reaction occurring within the last 10 years: NO   Aspirin Nausea Only   Ibuprofen Nausea Only   Lyrica [Pregabalin] Nausea Only   Acetaminophen Nausea Only    Family History  Problem Relation Age of Onset   Heart failure Mother    Hypertension Mother    Diverticulitis Mother    Colitis Mother    Diabetes Father    Hypertension Father    Heart disease Father    Diabetes Sister    Heart failure Brother    Diverticulitis Brother     Prior to Admission medications   Medication Sig Start Date End Date Taking? Authorizing Provider  apixaban (ELIQUIS) 5 MG TABS tablet Take 1 tablet by mouth twice daily 11/27/22   [name], MD  blood glucose meter kit and supplies KIT Dispense based on patient and insurance preference. Use up to tid E11.65,  LON:99 05/10/22   Alba Cory, MD  Budeson-Glycopyrrol-Formoterol (BREZTRI AEROSPHERE) 160-9-4.8 MCG/ACT AERO Inhale 2 puffs into the lungs 2 (two) times daily. Patient receives via [employer] Patient Assistance. 09/19/22   Alba Cory, MD  dapagliflozin propanediol (FARXIGA) 10 MG TABS tablet Take 1 tablet (10 mg total) by mouth daily. 11/30/22   Alba Cory, MD  diltiazem (DILT-XR) 240 MG 24 hr capsule Take 1 capsule (240 mg total) by mouth daily. 08/23/22   Charlsie Quest, NP  gabapentin (NEURONTIN) 300 MG capsule Take 300-600 mg by mouth See admin instructions. Take 1 capsule (300 mg) by mouth in the morning, take 1 capsule (300 mg) by mouth at noon, and 2 capsules (600 mg) by mouth in the evening    [provider]  guaiFENesin (MUCINEX) 600 MG 12 hr tablet Take 600 mg by mouth 2 (two) times daily as needed for to loosen phlegm.    [provider]  Lancet Devices MISC 1 each by Does not apply route 3 (three) times daily  as needed. What ins will cover dx:E11.65, LON:99 tid 09/19/21   Margarita Mail, DO  LEVEMIR 100 UNIT/ML injection INJECT 8 UNITS SUBCUTANEOUSLY AT BEDTIME 01/08/23   Alba Cory, MD  losartan (COZAAR) 25 MG tablet Take 0.5 tablets (12.5 mg total) by mouth daily. 11/24/22   Charlsie Quest, NP  potassium chloride (KLOR-CON) 10 MEQ tablet Take 1 tablet (10 mEq total) by mouth every Monday, Wednesday, and Friday. 09/04/22 12/03/22  Charlsie Quest, NP  pravastatin (PRAVACHOL) 20 MG tablet Take 1 tablet (20 mg total) by mouth daily with supper. 07/17/22   Alba Cory, MD  sulfamethoxazole-trimethoprim (BACTRIM DS) 800-160 MG tablet Take 1 tablet by mouth 2 (two) times daily. 12/18/22   Alba Cory, MD  tiZANidine (ZANAFLEX) 4 MG tablet Take 4 mg by mouth every 12 (twelve) hours.    Yevonne Pax, MD  traMADol (ULTRAM) 50 MG tablet Take 50 mg by mouth every 12 (twelve) hours.    Yevonne Pax, MD  Vitamin D, Ergocalciferol, (DRISDOL) 1.25 MG (50000 UNIT)  CAPS capsule TAKE 1 CAPSULE BY MOUTH EVERY TWO WEEKS 12/27/22   Alba Cory, MD    Physical Exam: Vitals:   01/21/23 0532 01/21/23 0630 01/21/23 0700 01/21/23 0730  BP: (!) 142/116 (!) 168/95 (!) 153/101 (!) 148/98  Pulse: (!) 140 (!) 118 (!) 125 (!) 135  Resp: (!) 26 19 20 19   Temp: 98.4 F (36.9 C)     SpO2: (!) 89% 100% 100% 98%    Constitutional: NAD, calm, comfortable, mild conversation doe Vitals:   01/21/23 0532 01/21/23 0630 01/21/23 0700 01/21/23 0730  BP: (!) 142/116 (!) 168/95 (!) 153/101 (!) 148/98  Pulse: (!) 140 (!) 118 (!) 125 (!) 135  Resp: (!) 26 19 20 19   Temp: 98.4 F (36.9 C)     SpO2: (!) 89% 100% 100% 98%   Eyes: PERRL, lids and conjunctivae normal ENMT: Mucous membranes are moist. Posterior pharynx clear of any exudate or lesions.Normal dentition.  Neck: normal, supple, no masses, no thyromegaly Respiratory: +wheezing, no crackles. Normal respiratory effort. no accessory muscle use.  Cardiovascular: tachy regular rhythm, no murmurs / rubs / gallops. Trace extremity edema. 2+ pedal pulses.  Abdomen: no tenderness, no masses palpated. No hepatosplenomegaly. Bowel sounds positive.  Musculoskeletal: no clubbing / cyanosis. No joint deformity upper and lower extremities. Good ROM, no contractures. Normal muscle tone.  Skin: no rashes, lesions, ulcers. No induration Neurologic: CN 2-12 grossly intact. Sensation intact, . Strength 5/5 in all 4.  Psychiatric: Normal judgment and insight. Alert and oriented x 3. Normal mood.    Labs on Admission: I have personally reviewed following labs and imaging studies  CBC: Recent Labs  Lab 01/21/23 0550  WBC 7.5  NEUTROABS 5.2  HGB 14.7  HCT 44.8  MCV 93.1  PLT 196   Basic Metabolic Panel: Recent Labs  Lab 01/21/23 0645  NA 137  K 3.8  CL 101  CO2 29  GLUCOSE 92  BUN 21  CREATININE 0.74  CALCIUM 8.5*   GFR: Estimated Creatinine Clearance: 81.1 mL/min (by C-G formula based on SCr of 0.74  mg/dL). Liver Function Tests: Recent Labs  Lab 01/21/23 0645  AST 28  ALT 31  ALKPHOS 95  BILITOT 1.3*  PROT 6.2*  ALBUMIN 3.8   No results for input(s): "LIPASE", "AMYLASE" in the last 168 hours. No results for input(s): "AMMONIA" in the last 168 hours. Coagulation Profile: No results for input(s): "INR", "PROTIME" in the last 168 hours. Cardiac Enzymes:  No results for input(s): "CKTOTAL", "CKMB", "CKMBINDEX", "TROPONINI" in the last 168 hours. BNP (last 3 results) No results for input(s): "PROBNP" in the last 8760 hours. HbA1C: No results for input(s): "HGBA1C" in the last 72 hours. CBG: No results for input(s): "GLUCAP" in the last 168 hours. Lipid Profile: No results for input(s): "CHOL", "HDL", "LDLCALC", "TRIG", "CHOLHDL", "LDLDIRECT" in the last 72 hours. Thyroid Function Tests: No results for input(s): "TSH", "T4TOTAL", "FREET4", "T3FREE", "THYROIDAB" in the last 72 hours. Anemia Panel: No results for input(s): "VITAMINB12", "FOLATE", "FERRITIN", "TIBC", "IRON", "RETICCTPCT" in the last 72 hours. Urine analysis:    Component Value Date/Time   COLORURINE YELLOW (A) 04/19/2021 2204   APPEARANCEUR HAZY (A) 04/19/2021 2204   LABSPEC 1.019 04/19/2021 2204   PHURINE 6.0 04/19/2021 2204   GLUCOSEU NEGATIVE 04/19/2021 2204   HGBUR MODERATE (A) 04/19/2021 2204   BILIRUBINUR NEGATIVE 04/19/2021 2204   KETONESUR 80 (A) 04/19/2021 2204   PROTEINUR 30 (A) 04/19/2021 2204   NITRITE NEGATIVE 04/19/2021 2204   LEUKOCYTESUR NEGATIVE 04/19/2021 2204    Radiological Exams on Admission: DG Chest Port 1 View  Result Date: 01/21/2023 CLINICAL DATA:  Shortness of breath.  Cough. EXAM: PORTABLE CHEST 1 VIEW COMPARISON:  01/13/2023 FINDINGS: The lungs are clear without focal pneumonia, edema, pneumothorax or pleural effusion. Interstitial markings are diffusely coarsened with chronic features. The cardiopericardial silhouette is within normal limits for size. Old posterior right rib  fracture evident. Telemetry leads overlie the chest. IMPRESSION: Chronic interstitial coarsening without acute cardiopulmonary findings. Electronically Signed   By: Kennith Center M.D.   On: 01/21/2023 06:10    EKG: Independently reviewed. See above  Assessment/Plan Acute COPD exacerbation with acute hypoxic respiratory failure  -admit to progressive care  - solumedrol iv , transition to po prednisone per protocol  - ctx/ azithromycin -f/u on sputum cultures  -CT chest due to recurrent exacerbation  -cxr : NAD - nebs standing and prn  -resume chronic inhalers  -pulmonary toilet  -wean O2 as able    Atrial flutter/fib with RVR  - continue on diltiazem drip  - resume oral home regimen  - hold eliquis due hx of black stools    Episode of black stools R/O Gi bleed  - h/h stable  - will monitor h/h  -check fob  - further evaluation based on above results  -ppi bid  - hold eliquis     CHF ref  -ef 40-45%  - continue on farxiga , cozaar  Hypertension  HLD -continue pravastatin    type 2 diabetes, -iss /fs -resume lantus   PAD, -no active issues    Hepc C -no active issues  -stable lfts   Schizoaffective/schizophrenia disorder -compensated  -no active issues    Tobacco  -tobacco education  -nicotine patch   DVT prophylaxis:  on OAC Code Status: full/ as discussed per patient wishes in event of cardiac arrest  Family Communication: none at bedside Disposition Plan.  patient  expected to be admitted greater than 2 midnights  Consults called: n/a  Admission status:  progressive care   Lurline Del MD [facility]   If 7PM-7AM, please contact night-coverage [email or web address] Password [facility]  01/21/2023, 7:44 AM

## 2023-01-21 NOTE — ED Triage Notes (Signed)
Pt c/o sob, pt has hx of COPD and was seen at this ER on 01/13/23 for same symptoms. Sts cough went away a little bit but then worsened again today. Congested cough noted in triage.

## 2023-01-21 NOTE — ED Provider Notes (Signed)
[facility] Provider Note    Event Date/Time   First MD Initiated Contact with Patient 01/21/23 [phone]     (approximate)   History   Shortness of Breath   HPI Mark Nash is a 67 y.o. male whose past medical history includes hyperlipidemia, diabetes, atrial fibrillation on Eliquis, CHF, peripheral vascular disease, COPD, hep C, and schizoaffective disorder who presents to the ED complaining of shortness of breath.  He said it has been going on pretty consistently for 2 weeks but is gradually getting worse and tonight it was severe.  He said he got a little bit better after his emergency department visit about a week ago but wants his medication ran out (presumably the prednisone) and then he started feeling worse again.  He said that he still uses breathing treatments at home but they are becoming less effective for him.  He denies fever, chest pain, nausea, vomiting, and abdominal pain.     Physical Exam   Triage Vital Signs: ED Triage Vitals  Enc Vitals Group     BP 01/21/23 0532 (!) 142/116     Pulse Rate 01/21/23 0532 (!) 140     Resp 01/21/23 0532 (!) 26     Temp 01/21/23 0532 98.4 F (36.9 C)     Temp src --      SpO2 01/21/23 0532 (!) 89 %     Weight --      Height --      Head Circumference --      Peak Flow --      Pain Score 01/21/23 0533 0     Pain Loc --      Pain Edu? --      Excl. in GC? --     Most recent vital signs: Vitals:   01/21/23 0532 01/21/23 0630  BP: (!) 142/116 (!) 168/95  Pulse: (!) 140 (!) 118  Resp: (!) 26 19  Temp: 98.4 F (36.9 C)   SpO2: (!) 89% 100%    General: Awake, moderate respiratory distress. CV:  Good peripheral perfusion.  Regular rhythm and tachycardia.  Normal heart sounds. Resp:  Increased respiratory effort with accessory muscle usage and intercostal retractions although they are relatively mild.  He has expiratory wheezing throughout all lung fields but is moving good air. Abd:  No  distention.  Thin body habitus.  No tenderness to palpation. Other:  Awake and alert, conversing quite a bit in spite of his shortness of breath.   ED Results / Procedures / Treatments   Labs (all labs ordered are listed, but only abnormal results are displayed) Labs Reviewed  RESP PANEL BY RT-PCR (RSV, FLU A&B, COVID)  [id number]  BRAIN NATRIURETIC PEPTIDE  CBC WITH DIFFERENTIAL/PLATELET  BLOOD GAS, VENOUS  COMPREHENSIVE METABOLIC PANEL     EKG  ED ECG REPORT I, Loleta Rose, the attending physician, personally viewed and interpreted this ECG.  Date: 01/21/2023 EKG Time: 5:39 AM Rate: 118 Rhythm: Atrial flutter QRS Axis: normal Intervals: Abnormal due to a flutter, otherwise unremarkable ST/T Wave abnormalities: Non-specific ST segment / T-wave changes, but no clear evidence of acute ischemia. Narrative Interpretation: no definitive evidence of acute ischemia; does not meet STEMI criteria.    RADIOLOGY I viewed and interpreted the patient's 1 view chest x-ray.  See hospital course for details: No acute or emergent abnormalities.   PROCEDURES:  Critical Care performed: Yes, see critical care procedure note(s)  Procedures    IMPRESSION / MDM /  ASSESSMENT AND PLAN / ED COURSE  I reviewed the triage vital signs and the nursing notes.                              Differential diagnosis includes, but is not limited to, COPD exacerbation, PE, pneumonia, pneumothorax.  Patient's presentation is most consistent with acute presentation with potential threat to life or bodily function.  Labs/studies ordered: EKG, 1 view chest x-ray, VBG, BNP, CBC with differential, CMP, respiratory viral panel  Interventions/Medications given:  Medications  ipratropium-albuterol (DUONEB) 0.5-2.5 (3) MG/3ML nebulizer solution 3 mL (3 mLs Nebulization Given 01/21/23 0609)  ipratropium-albuterol (DUONEB) 0.5-2.5 (3) MG/3ML nebulizer solution 3 mL (3 mLs Nebulization Given 01/21/23 0609)   ipratropium-albuterol (DUONEB) 0.5-2.5 (3) MG/3ML nebulizer solution 3 mL (3 mLs Nebulization Given 01/21/23 1610)  methylPREDNISolone sodium succinate (SOLU-MEDROL) 125 mg/2 mL injection 125 mg (125 mg Intravenous Given 01/21/23 0610)  sodium chloride 0.9 % bolus 500 mL (500 mLs Intravenous New Bag/Given 01/21/23 0611)  albuterol (PROVENTIL) (2.5 MG/3ML) 0.083% nebulizer solution 2.5 mg (2.5 mg Nebulization Given 01/21/23 0654)    (Note:  hospital course my include additional interventions and/or labs/studies not listed above.)   Probable COPD exacerbation, but I reviewed the record and see that he was here about a week ago.  This time when he arrived he is hypoxemic in the upper 80s.  Heart rate is elevated and in atrial flutter which is his baseline.  No chest pain, but he seems to not be responding well to outpatient treatment.  He says he is compliant with his Eliquis.  The patient is on the cardiac monitor to evaluate for evidence of arrhythmia and/or significant heart rate changes.  I ordered a small fluid bolus of 500 mL for the probable insensible losses and to see if this would help a bit with this tachycardia.  Workup pending, 3 DuoNebs and Solu-Medrol 125 mg IV pending.  Will reassess but anticipate he may require hospitalization this time.  Clinical Course as of 01/21/23 0703  Sun Jan 21, 2023  0611 CBC with Differential/Platelet Normal CBC [CF]  0618 [facility] I viewed and interpreted the patient's 1 view chest x-ray.  There is no evidence of pneumonia nor pneumothorax.  He has some chronic changes but no acute abnormalities.  Radiology report confirms no acute abnormalities. [CF]  [phone] Patient still using accessory muscles, still with expiratory wheezing.  Still able to talk.   [CF]  [phone] Resp panel by RT-PCR (RSV, Flu A&B, Covid) Anterior Nasal Swab Negative respiratory viral panel [CF]  [phone] Patient coughing and having bronchospasms.  Ordering another albuterol  nebulizer treatment.  Lab required redraw for metabolic panel so once the labs are back we can consult the hospitalist for admission. [CF]  [id number] Transferring ED care to Dr. Sidney Ace to follow up on metabolic panel and VBG and admit when labs are back. [CF]    Clinical Course User Index [CF] Loleta Rose, MD     FINAL CLINICAL IMPRESSION(S) / ED DIAGNOSES   Final diagnoses:  COPD exacerbation (HCC)  Chronic atrial flutter (HCC)  Acute respiratory failure with hypoxia (HCC)     Rx / DC Orders   ED Discharge Orders     None        Note:  This document was prepared using Dragon voice recognition software and may include unintentional dictation errors.   Loleta Rose, MD 01/21/23 [phone]

## 2023-01-21 NOTE — ED Provider Notes (Addendum)
Patient signed out to me pending results of VBG and CMP and then admission.  Is a 67 year old male with history of COPD, atrial fibs/flutter on Eliquis and CHF with last EF of 45 to 50% who presents because of dyspnea.  Found to be in COPD exacerbation.  Patient has received 3 DuoNebs and Solu-Medrol.  Continues to have wheezing and is now on 2 L nasal cannula.  On my assessment he is not in overt respiratory distress but is wheezing with decreased air movement.  He is tachycardic with heart rates in the 130s with EKG showing a flutter.  I see that he is on diltiazem which she did not take yet today because of being in the ER.  With him having significant COPD exacerbation we will avoid beta-blockers.  Will give a dose of IV Dilt may need diltiazem drip.  He has already received a bolus of fluid does not appear significantly dry so we will avoid any additional fluids.  CMP is reassuring and VBG does not show significant respiratory acidosis.  Suspect that the A-flutter is being driven both by his COPD and by beta agonist.  Will discuss with the hospitalist and admit the patient.  Patient still significantly tachycardic and a flutter after 10 mg still.  Will order dilt drip.  CRITICAL CARE Performed by: Randol Kern   Total critical care time: 30 minutes  Critical care time was exclusive of separately billable procedures and treating other patients.  Critical care was necessary to treat or prevent imminent or life-threatening deterioration.  Critical care was time spent personally by me on the following activities: development of treatment plan with patient and/or surrogate as well as nursing, discussions with consultants, evaluation of patient's response to treatment, examination of patient, obtaining history from patient or surrogate, ordering and performing treatments and interventions, ordering and review of laboratory studies, ordering and review of radiographic studies, pulse oximetry and  re-evaluation of patient's condition.    Georga Hacking, MD 01/21/23 0725    Georga Hacking, MD 01/21/23 1610    Georga Hacking, MD 01/21/23 [phone]

## 2023-01-22 ENCOUNTER — Encounter: Payer: Self-pay | Admitting: Internal Medicine

## 2023-01-22 DIAGNOSIS — Z794 Long term (current) use of insulin: Secondary | ICD-10-CM

## 2023-01-22 DIAGNOSIS — J441 Chronic obstructive pulmonary disease with (acute) exacerbation: Secondary | ICD-10-CM

## 2023-01-22 DIAGNOSIS — E1142 Type 2 diabetes mellitus with diabetic polyneuropathy: Secondary | ICD-10-CM

## 2023-01-22 LAB — COMPREHENSIVE METABOLIC PANEL
ALT: 27 U/L (ref 0–44)
AST: 20 U/L (ref 15–41)
Albumin: 3.5 g/dL (ref 3.5–5.0)
Alkaline Phosphatase: 87 U/L (ref 38–126)
Anion gap: 4 — ABNORMAL LOW (ref 5–15)
BUN: 21 mg/dL (ref 8–23)
CO2: 28 mmol/L (ref 22–32)
Calcium: 8.5 mg/dL — ABNORMAL LOW (ref 8.9–10.3)
Chloride: 101 mmol/L (ref 98–111)
Creatinine, Ser: 0.75 mg/dL (ref 0.61–1.24)
GFR, Estimated: >60 mL/min (ref >60)
Glucose, Bld: 145 mg/dL — ABNORMAL HIGH (ref 70–99)
Potassium: 4.4 mmol/L (ref 3.5–5.1)
Sodium: 133 mmol/L — ABNORMAL LOW (ref 135–145)
Total Bilirubin: 1.2 mg/dL (ref 0.3–1.2)
Total Protein: 6.2 g/dL — ABNORMAL LOW (ref 6.5–8.1)

## 2023-01-22 LAB — RESPIRATORY PANEL BY PCR

## 2023-01-22 LAB — CBC
HCT: 42.4 % (ref 39.0–52.0)
Hemoglobin: 14.4 g/dL (ref 13.0–17.0)
MCH: 30.8 pg (ref 26.0–34.0)
MCHC: 34 g/dL (ref 30.0–36.0)
MCV: 90.6 fL (ref 80.0–100.0)
Platelets: 199 10*3/uL (ref 150–400)
RBC: 4.68 MIL/uL (ref 4.22–5.81)
RDW: 13.3 % (ref 11.5–15.5)
WBC: 10.8 10*3/uL — ABNORMAL HIGH (ref 4.0–10.5)
nRBC: 0 % (ref 0.0–0.2)

## 2023-01-22 LAB — GLUCOSE, CAPILLARY
Glucose-Capillary: 130 mg/dL — ABNORMAL HIGH (ref 70–99)
Glucose-Capillary: 340 mg/dL — ABNORMAL HIGH (ref 70–99)
Glucose-Capillary: 297 mg/dL — ABNORMAL HIGH (ref 70–99)
Glucose-Capillary: 168 mg/dL — ABNORMAL HIGH (ref 70–99)
Glucose-Capillary: 310 mg/dL — ABNORMAL HIGH (ref 70–99)

## 2023-01-22 LAB — EXPECTORATED SPUTUM ASSESSMENT W GRAM STAIN, RFLX TO RESP C

## 2023-01-22 LAB — HIV ANTIBODY (ROUTINE TESTING W REFLEX): HIV Screen 4th Generation wRfx: NONREACTIVE

## 2023-01-22 LAB — MAGNESIUM: Magnesium: 2.6 mg/dL — ABNORMAL HIGH (ref 1.7–2.4)

## 2023-01-22 MED ORDER — IPRATROPIUM-ALBUTEROL 0.5-2.5 (3) MG/3ML IN SOLN
3.0000 mL | Freq: Two times a day (BID) | RESPIRATORY_TRACT | Status: DC
  Administered 2023-01-22 – 2023-01-26 (×8): 3 mL via RESPIRATORY_TRACT
  Filled 2023-01-22 (×9): qty 3

## 2023-01-22 MED ORDER — AZITHROMYCIN 250 MG PO TABS
500.0000 mg | ORAL_TABLET | Freq: Every day | ORAL | Status: AC
  Administered 2023-01-22 – 2023-01-24 (×3): 500 mg via ORAL
  Filled 2023-01-22 (×3): qty 2

## 2023-01-22 NOTE — Progress Notes (Addendum)
Progress Note    Mark Nash  ZOX:[id number] DOB: 11-Sep-1955  DOA: 01/21/2023 PCP: Alba Cory, MD      Brief Narrative:    Medical records reviewed and are as summarized below:  Mark Nash is a 68 y.o. male  with medical history significant of  hypertension,HLD, type 2 diabetes, PAD, COPD/asthma not O2 dependent, atrial fibrillation on chronic anticoagulation with apixaban, CHFef 40-45%, hepc C, schizoaffective/schizophrenia disorder, tobacco use, who has interim history of visit to ED on 5/18 with similar complaints of shortness of breath.  He was diagnosed with COPD exacerbation and discharge to home prednisone and doxycycline at that time.  He presented to the hospital again on 01/21/2023 with cough, congestion and shortness of breath.  He also reported some dark stools about 3 days prior to admission.   He was admitted to the hospital for COPD exacerbation.    Assessment/Plan:   Principal Problem:   COPD exacerbation (HCC) Active Problems:   Paranoid schizophrenia (HCC)   Peripheral vascular disease (HCC)   Type 2 diabetes mellitus with diabetic polyneuropathy, with long-term current use of insulin (HCC)   Body mass index is 18.19 kg/m.   COPD exacerbation: Continue prednisone and bronchodilators.  Change doxycycline to azithromycin because of recent use of doxycycline.  Flutter valve has been prescribed.   Paroxysmal atrial fibrillation/atrial flutter with RVR: Continue IV Cardizem drip and taper off as able.  Eliquis on hold because of report of recent black stools.   Recent black stools: No acute overt bleeding at this time.  H&H is stable.  Eliquis is on hold for now.  Continue Protonix   Chronic systolic CHF: Compensated.  2D echo in January 2024 showed EF estimated at 40 to 45%.    Type II DM with hyperglycemia: Continue insulin glargine and Farxiga.   Other comorbidities include hypertension, PVD, hyperlipidemia, schizoaffective  disorder, history of hepatitis C, lumbar degenerative disc disease    Diet Order             Diet Carb Modified Fluid consistency: Thin; Room service appropriate? Yes  Diet effective now                            Consultants: None  Procedures: None    Medications:    azithromycin  500 mg Oral Daily   dapagliflozin propanediol  10 mg Oral Daily   diltiazem  240 mg Oral Daily   fluticasone furoate-vilanterol  1 puff Inhalation Daily   And   umeclidinium bromide  1 puff Inhalation Daily   gabapentin  300 mg Oral q AM   And   gabapentin  300 mg Oral Q1200   And   gabapentin  600 mg Oral QHS   insulin aspart  0-9 Units Subcutaneous TID WC   insulin detemir  8 Units Subcutaneous QHS   ipratropium-albuterol  3 mL Nebulization BID   pantoprazole (PROTONIX) IV  40 mg Intravenous Q12H   pravastatin  20 mg Oral QHS   predniSONE  40 mg Oral Q breakfast   traMADol  50 mg Oral Q12H   Continuous Infusions:  diltiazem (CARDIZEM) infusion 12.5 mg/hr (01/22/23 0145)     Anti-infectives (From admission, onward)    Start     Dose/Rate Route Frequency Ordered Stop   01/22/23 1000  azithromycin (ZITHROMAX) tablet 500 mg        500 mg Oral Daily 01/22/23 0741 01/25/23 0959  01/21/23 1015  doxycycline (VIBRA-TABS) tablet 100 mg  Status:  Discontinued        100 mg Oral Every 12 hours 01/21/23 0910 01/22/23 0741              Family Communication/Anticipated D/C date and plan/Code Status   DVT prophylaxis: SCDs Start: 01/21/23 0909     Code Status: Full Code  Family Communication: None Disposition Plan: Plan to discharge home in 1 to 2 days   Status is: Inpatient Remains inpatient appropriate because: COPD exacerbation       Subjective:   Interval events noted.  He complains of cough but he is unable to get any phlegm out.  He still feels short of breath but is better than yesterday.  No chest pain.  Objective:    Vitals:   01/22/23 0544  01/22/23 0750 01/22/23 0853 01/22/23 1158  BP:  (!) 144/83 129/87 132/76  Pulse:  99  90  Resp:  (!) 22  20  Temp:  97.7 F (36.5 C)  97.6 F (36.4 C)  TempSrc:  Axillary  Oral  SpO2:  97%    Weight: 59.1 kg      No data found.   Intake/Output Summary (Last 24 hours) at 01/22/2023 1159 Last data filed at 01/22/2023 1058 Gross per 24 hour  Intake 866.68 ml  Output 1665 ml  Net -798.32 ml   Filed Weights   01/22/23 0544  Weight: 59.1 kg    Exam:  GEN: NAD SKIN: Warm and dry EYES: EOMI ENT: MMM CV: Irregular rate and rhythm, tachycardic PULM: Decreased air entry bilaterally, no wheezing or rales heard. ABD: soft, ND, NT, +BS CNS: AAO x 3, non focal EXT: No edema or tenderness      Data Reviewed:   I have personally reviewed following labs and imaging studies:  Labs: Labs show the following:   Basic Metabolic Panel: Recent Labs  Lab 01/21/23 0645 01/22/23 0615  NA 137 133*  K 3.8 4.4  CL 101 101  CO2 29 28  GLUCOSE 92 145*  BUN 21 21  CREATININE 0.74 0.75  CALCIUM 8.5* 8.5*   GFR Estimated Creatinine Clearance: 74.9 mL/min (by C-G formula based on SCr of 0.75 mg/dL). Liver Function Tests: Recent Labs  Lab 01/21/23 0645 01/22/23 0615  AST 28 20  ALT 31 27  ALKPHOS 95 87  BILITOT 1.3* 1.2  PROT 6.2* 6.2*  ALBUMIN 3.8 3.5   No results for input(s): "LIPASE", "AMYLASE" in the last 168 hours. No results for input(s): "AMMONIA" in the last 168 hours. Coagulation profile No results for input(s): "INR", "PROTIME" in the last 168 hours.  CBC: Recent Labs  Lab 01/21/23 0550 01/21/23 1220 01/21/23 1720 01/21/23 2054 01/22/23 0615  WBC 7.5  --   --   --  10.8*  NEUTROABS 5.2  --   --   --   --   HGB 14.7 14.1 14.7 14.2 14.4  HCT 44.8  --   --   --  42.4  MCV 93.1  --   --   --  90.6  PLT 196  --   --   --  199   Cardiac Enzymes: No results for input(s): "CKTOTAL", "CKMB", "CKMBINDEX", "TROPONINI" in the last 168 hours. BNP (last 3  results) No results for input(s): "PROBNP" in the last 8760 hours. CBG: Recent Labs  Lab 01/21/23 1718 01/21/23 2135 01/22/23 0755  GLUCAP 298* 214* 130*   D-Dimer: Recent Labs  01/21/23 1720  DDIMER <0.27   Hgb A1c: No results for input(s): "HGBA1C" in the last 72 hours. Lipid Profile: No results for input(s): "CHOL", "HDL", "LDLCALC", "TRIG", "CHOLHDL", "LDLDIRECT" in the last 72 hours. Thyroid function studies: Recent Labs    01/21/23 1220  TSH 0.564   Anemia work up: Recent Labs    01/21/23 1220  VITAMINB12 240  FOLATE 18.6  FERRITIN 97  TIBC 314  IRON 61  RETICCTPCT 1.3   Sepsis Labs: Recent Labs  Lab 01/21/23 0550 01/22/23 0615  WBC 7.5 10.8*    Microbiology Recent Results (from the past 240 hour(s))  SARS Coronavirus 2 by RT PCR (hospital order, performed in [facility] hospital lab) *cepheid single result test* Anterior Nasal Swab     Status: None   Collection Time: 01/13/23  2:53 AM   Specimen: Anterior Nasal Swab  Result Value Ref Range Status   SARS Coronavirus 2 by RT PCR NEGATIVE NEGATIVE Final    Comment: (NOTE) SARS-CoV-2 target nucleic acids are NOT DETECTED.  The SARS-CoV-2 RNA is generally detectable in upper and lower respiratory specimens during the acute phase of infection. The lowest concentration of SARS-CoV-2 viral copies this assay can detect is 250 copies / mL. A negative result does not preclude SARS-CoV-2 infection and should not be used as the sole basis for treatment or other patient management decisions.  A negative result may occur with improper specimen collection / handling, submission of specimen other than nasopharyngeal swab, presence of viral mutation(s) within the areas targeted by this assay, and inadequate number of viral copies (<250 copies / mL). A negative result must be combined with clinical observations, patient history, and epidemiological information.  Fact Sheet for Patients:    RoadLapTop.co.za  Fact Sheet for Healthcare Providers: http://kim-miller.com/  This test is not yet approved or  cleared by the [place] FDA and has been authorized for detection and/or diagnosis of SARS-CoV-2 by FDA under an Emergency Use Authorization (EUA).  This EUA will remain in effect (meaning this test can be used) for the duration of the COVID-19 declaration under Section 564(b)(1) of the Act, 21 U.S.C. section 360bbb-3(b)(1), unless the authorization is terminated or revoked sooner.  Performed at [facility], [address] [place], [place] [address]   Resp panel by RT-PCR (RSV, Flu A&B, Covid) Anterior Nasal Swab     Status: None   Collection Time: 01/21/23  5:50 AM   Specimen: Anterior Nasal Swab  Result Value Ref Range Status   SARS Coronavirus 2 by RT PCR NEGATIVE NEGATIVE Final    Comment: (NOTE) SARS-CoV-2 target nucleic acids are NOT DETECTED.  The SARS-CoV-2 RNA is generally detectable in upper respiratory specimens during the acute phase of infection. The lowest concentration of SARS-CoV-2 viral copies this assay can detect is 138 copies/mL. A negative result does not preclude SARS-Cov-2 infection and should not be used as the sole basis for treatment or other patient management decisions. A negative result may occur with  improper specimen collection/handling, submission of specimen other than nasopharyngeal swab, presence of viral mutation(s) within the areas targeted by this assay, and inadequate number of viral copies(<138 copies/mL). A negative result must be combined with clinical observations, patient history, and epidemiological information. The expected result is Negative.  Fact Sheet for Patients:  BloggerCourse.com  Fact Sheet for Healthcare Providers:  SeriousBroker.it  This test is no t yet approved or cleared by the [place]  FDA and  has been authorized for detection and/or  diagnosis of SARS-CoV-2 by FDA under an Emergency Use Authorization (EUA). This EUA will remain  in effect (meaning this test can be used) for the duration of the COVID-19 declaration under Section 564(b)(1) of the Act, 21 U.S.C.section 360bbb-3(b)(1), unless the authorization is terminated  or revoked sooner.       Influenza A by PCR NEGATIVE NEGATIVE Final   Influenza B by PCR NEGATIVE NEGATIVE Final    Comment: (NOTE) The Xpert Xpress SARS-CoV-2/FLU/RSV plus assay is intended as an aid in the diagnosis of influenza from Nasopharyngeal swab specimens and should not be used as a sole basis for treatment. Nasal washings and aspirates are unacceptable for Xpert Xpress SARS-CoV-2/FLU/RSV testing.  Fact Sheet for Patients: BloggerCourse.com  Fact Sheet for Healthcare Providers: SeriousBroker.it  This test is not yet approved or cleared by the [place] FDA and has been authorized for detection and/or diagnosis of SARS-CoV-2 by FDA under an Emergency Use Authorization (EUA). This EUA will remain in effect (meaning this test can be used) for the duration of the COVID-19 declaration under Section 564(b)(1) of the Act, 21 U.S.C. section 360bbb-3(b)(1), unless the authorization is terminated or revoked.     Resp Syncytial Virus by PCR NEGATIVE NEGATIVE Final    Comment: (NOTE) Fact Sheet for Patients: BloggerCourse.com  Fact Sheet for Healthcare Providers: SeriousBroker.it  This test is not yet approved or cleared by the [place] FDA and has been authorized for detection and/or diagnosis of SARS-CoV-2 by FDA under an Emergency Use Authorization (EUA). This EUA will remain in effect (meaning this test can be used) for the duration of the COVID-19 declaration under Section 564(b)(1) of the Act, 21 U.S.C. section  360bbb-3(b)(1), unless the authorization is terminated or revoked.  Performed at [facility], [address] [place], [place] [address]     Procedures and diagnostic studies:  DG Chest [facility] 1 View  Result Date: 01/21/2023 CLINICAL DATA:  Shortness of breath.  Cough. EXAM: PORTABLE CHEST 1 VIEW COMPARISON:  01/13/2023 FINDINGS: The lungs are clear without focal pneumonia, edema, pneumothorax or pleural effusion. Interstitial markings are diffusely coarsened with chronic features. The cardiopericardial silhouette is within normal limits for size. Old posterior right rib fracture evident. Telemetry leads overlie the chest. IMPRESSION: Chronic interstitial coarsening without acute cardiopulmonary findings. Electronically Signed   By: Kennith Center M.D.   On: 01/21/2023 06:10               LOS: 1 day   Teyon Odette  [facility]   Pager on [email or web address]. If 7PM-7AM, please contact night-coverage at [email or web address]     01/22/2023, 11:59 AM

## 2023-01-22 NOTE — Plan of Care (Signed)
  Problem: Education: Goal: Knowledge of disease or condition will improve Outcome: Progressing Goal: Knowledge of the prescribed therapeutic regimen will improve Outcome: Progressing Goal: Individualized Educational Video(s) Outcome: Progressing   Problem: Activity: Goal: Ability to tolerate increased activity will improve Outcome: Progressing Goal: Will verbalize the importance of balancing activity with adequate rest periods Outcome: Progressing   Problem: Respiratory: Goal: Ability to maintain a clear airway will improve Outcome: Progressing Goal: Levels of oxygenation will improve Outcome: Progressing Goal: Ability to maintain adequate ventilation will improve Outcome: Progressing   Problem: Education: Goal: Ability to describe self-care measures that may prevent or decrease complications (Diabetes Survival Skills Education) will improve Outcome: Progressing Goal: Individualized Educational Video(s) Outcome: Progressing   Problem: Coping: Goal: Ability to adjust to condition or change in health will improve Outcome: Progressing   Problem: Fluid Volume: Goal: Ability to maintain a balanced intake and output will improve Outcome: Progressing   Problem: Health Behavior/Discharge Planning: Goal: Ability to identify and utilize available resources and services will improve Outcome: Progressing Goal: Ability to manage health-related needs will improve Outcome: Progressing   Problem: Metabolic: Goal: Ability to maintain appropriate glucose levels will improve Outcome: Progressing   Problem: Nutritional: Goal: Maintenance of adequate nutrition will improve Outcome: Progressing Goal: Progress toward achieving an optimal weight will improve Outcome: Progressing   Problem: Skin Integrity: Goal: Risk for impaired skin integrity will decrease Outcome: Progressing   Problem: Tissue Perfusion: Goal: Adequacy of tissue perfusion will improve Outcome: Progressing    Problem: Education: Goal: Knowledge of General Education information will improve Description: Including pain rating scale, medication(s)/side effects and non-pharmacologic comfort measures Outcome: Progressing   Problem: Health Behavior/Discharge Planning: Goal: Ability to manage health-related needs will improve Outcome: Progressing   Problem: Activity: Goal: Risk for activity intolerance will decrease Outcome: Progressing   Problem: Nutrition: Goal: Adequate nutrition will be maintained Outcome: Progressing   Problem: Coping: Goal: Level of anxiety will decrease Outcome: Progressing   Problem: Elimination: Goal: Will not experience complications related to bowel motility Outcome: Progressing Goal: Will not experience complications related to urinary retention Outcome: Progressing   Problem: Pain Managment: Goal: General experience of comfort will improve Outcome: Progressing   Problem: Safety: Goal: Ability to remain free from injury will improve Outcome: Progressing   Problem: Skin Integrity: Goal: Risk for impaired skin integrity will decrease Outcome: Progressing

## 2023-01-23 DIAGNOSIS — B348 Other viral infections of unspecified site: Secondary | ICD-10-CM | POA: Diagnosis present

## 2023-01-23 DIAGNOSIS — J441 Chronic obstructive pulmonary disease with (acute) exacerbation: Secondary | ICD-10-CM | POA: Diagnosis not present

## 2023-01-23 LAB — GLUCOSE, CAPILLARY
Glucose-Capillary: 186 mg/dL — ABNORMAL HIGH (ref 70–99)
Glucose-Capillary: 232 mg/dL — ABNORMAL HIGH (ref 70–99)
Glucose-Capillary: 232 mg/dL — ABNORMAL HIGH (ref 70–99)
Glucose-Capillary: 159 mg/dL — ABNORMAL HIGH (ref 70–99)

## 2023-01-23 LAB — CULTURE, RESPIRATORY W GRAM STAIN

## 2023-01-23 MED ORDER — APIXABAN 5 MG PO TABS
5.0000 mg | ORAL_TABLET | Freq: Two times a day (BID) | ORAL | Status: DC
  Administered 2023-01-23 – 2023-01-26 (×6): 5 mg via ORAL
  Filled 2023-01-23 (×6): qty 1

## 2023-01-23 MED ORDER — INSULIN ASPART 100 UNIT/ML IJ SOLN
4.0000 [IU] | Freq: Three times a day (TID) | INTRAMUSCULAR | Status: DC
  Administered 2023-01-23 – 2023-01-27 (×11): 4 [IU] via SUBCUTANEOUS
  Filled 2023-01-23 (×11): qty 1

## 2023-01-23 MED ORDER — SENNOSIDES-DOCUSATE SODIUM 8.6-50 MG PO TABS: 1.0000 | ORAL_TABLET | Freq: Every evening | ORAL | Status: DC | PRN

## 2023-01-23 MED ORDER — POLYETHYLENE GLYCOL 3350 17 G PO PACK
17.0000 g | PACK | Freq: Every day | ORAL | Status: DC
  Administered 2023-01-23 – 2023-01-25 (×3): 17 g via ORAL
  Filled 2023-01-23 (×3): qty 1

## 2023-01-23 NOTE — Progress Notes (Signed)
1610  Patient cardiac rhythm changed from Aflutter to Afib at this time

## 2023-01-23 NOTE — Inpatient Diabetes Management (Signed)
Inpatient Diabetes Program Recommendations  AACE/ADA: New Consensus Statement on Inpatient Glycemic Control  Target Ranges:  Prepandial:   less than 140 mg/dL      Peak postprandial:   less than 180 mg/dL (1-2 hours)      Critically ill patients:  140 - 180 mg/dL    Latest Reference Range & Units 01/22/23 07:55 01/22/23 12:00 01/22/23 12:45 01/22/23 15:52 01/22/23 21:14 01/23/23 08:08  Glucose-Capillary 70 - 99 mg/dL 829 (H) 562 (H) 130 (H) 168 (H) 310 (H) 186 (H)   Review of Glycemic Control  Diabetes history: DM2 Outpatient Diabetes medications: Levemir 8 units daily, Farxiga 10 mg daily Current orders for Inpatient glycemic control: Levemir 8 units QHS, Novolog 0-9 units TID with meals, Farxiga 10 mg daily; Prednisone 40 mg QAM  Inpatient Diabetes Program Recommendations:    Insulin: If steroids are continued, please consider ordering Novolog 4 units TID with meals for meal coverage if patient eats at least 50% of meals.  Thanks, Orlando Penner, RN, MSN, CDCES Diabetes Coordinator Inpatient Diabetes Program [phone] (Team Pager from 8am to 5pm)

## 2023-01-23 NOTE — Progress Notes (Addendum)
Progress Note    DELUCA CLOOS  UJW:[id number] DOB: 1955-10-17  DOA: 01/21/2023 PCP: Alba Cory, MD      Brief Narrative:    Medical records reviewed and are as summarized below:  Mark Nash is a 67 y.o. male  with medical history significant of  hypertension,HLD, type 2 diabetes, PAD, COPD/asthma not O2 dependent, atrial fibrillation on chronic anticoagulation with apixaban, CHFef 40-45%, hepc C, schizoaffective/schizophrenia disorder, tobacco use, who has interim history of visit to ED on 5/18 with similar complaints of shortness of breath.  He was diagnosed with COPD exacerbation and discharge to home prednisone and doxycycline at that time.  He presented to the hospital again on 01/21/2023 with cough, congestion and shortness of breath.  He also reported some dark stools about 3 days prior to admission.   He was admitted to the hospital for COPD exacerbation.  He was treated with steroids, bronchodilators and empiric antibiotics.  Respiratory viral panel was positive for rhinovirus which probably triggered COPD exacerbation.    Assessment/Plan:   Principal Problem:   COPD exacerbation (HCC) Active Problems:   Paranoid schizophrenia (HCC)   Peripheral vascular disease (HCC)   Type 2 diabetes mellitus with diabetic polyneuropathy, with long-term current use of insulin (HCC)   Rhinovirus infection   Body mass index is 18.51 kg/m.   COPD exacerbation: Continue prednisone and bronchodilators.  Completed 3 days of azithromycin on 01/24/2023.  He said a flutter valve was helpful.  Continue to use flutter valve as needed.  He is tolerating room air. Respiratory viral panel was positive for rhinovirus.  He is on droplet precautions.   Paroxysmal atrial fibrillation/atrial flutter with RVR: Heart rate is better.  Discontinue IV Cardizem drip.  Continue oral Cardizem.  Restart Eliquis.    Recent black stools: No acute overt bleeding at this time.  H&H is stable.   Restart Eliquis.  Discontinue IV Protonix.  Repeat CBC tomorrow.   Chronic systolic CHF: Compensated.  2D echo in January 2024 showed EF estimated at 40 to 45%.    Type II DM with hyperglycemia: Continue insulin glargine and Farxiga.  Add NovoLog 4 units 3 times daily with meals   Other comorbidities include hypertension, PVD, hyperlipidemia, schizoaffective disorder, history of hepatitis C, lumbar degenerative disc disease    Diet Order             Diet Carb Modified Fluid consistency: Thin; Room service appropriate? Yes  Diet effective now                            Consultants: None  Procedures: None    Medications:    azithromycin  500 mg Oral Daily   dapagliflozin propanediol  10 mg Oral Daily   diltiazem  240 mg Oral Daily   fluticasone furoate-vilanterol  1 puff Inhalation Daily   And   umeclidinium bromide  1 puff Inhalation Daily   gabapentin  300 mg Oral q AM   And   gabapentin  300 mg Oral Q1200   And   gabapentin  600 mg Oral QHS   insulin aspart  0-9 Units Subcutaneous TID WC   insulin detemir  8 Units Subcutaneous QHS   ipratropium-albuterol  3 mL Nebulization BID   pantoprazole (PROTONIX) IV  40 mg Intravenous Q12H   pravastatin  20 mg Oral QHS   predniSONE  40 mg Oral Q breakfast   traMADol  50 mg  Oral Q12H   Continuous Infusions:     Anti-infectives (From admission, onward)    Start     Dose/Rate Route Frequency Ordered Stop   01/22/23 1000  azithromycin (ZITHROMAX) tablet 500 mg        500 mg Oral Daily 01/22/23 0741 01/25/23 0959   01/21/23 1015  doxycycline (VIBRA-TABS) tablet 100 mg  Status:  Discontinued        100 mg Oral Every 12 hours 01/21/23 0910 01/22/23 0741              Family Communication/Anticipated D/C date and plan/Code Status   DVT prophylaxis: SCDs Start: 01/21/23 0909     Code Status: Full Code  Family Communication: None Disposition Plan: Plan to discharge home tomorrow   Status  is: Inpatient Remains inpatient appropriate because: COPD exacerbation       Subjective:   Interval events noted.  He complains of shortness of breath although it is better.  He still has a cough this time he is able to produce some phlegm.  He thinks the flutter valve was helpful.  No bloody stools .Daniel Nones, RN, was at the bedside  Objective:    Vitals:   01/22/23 2100 01/22/23 2300 01/23/23 0300 01/23/23 0755  BP: 137/74 128/68 137/76   Pulse: 83 66 67   Resp: 20 18 20    Temp: 97.7 F (36.5 C) 97.9 F (36.6 C) 98.1 F (36.7 C)   TempSrc: Oral Oral Oral   SpO2: 97% 99% 100% 100%  Weight:   60.2 kg    No data found.   Intake/Output Summary (Last 24 hours) at 01/23/2023 1311 Last data filed at 01/23/2023 0300 Gross per 24 hour  Intake --  Output 1050 ml  Net -1050 ml   Filed Weights   01/22/23 0544 01/23/23 0300  Weight: 59.1 kg 60.2 kg    Exam:  GEN: NAD SKIN: Warm and dry EYES: No pallor or icterus ENT: MMM CV: Irregular rate and PULM: Decreased air entry bilaterally, no wheezing or rales heard ABD: soft, ND, NT, +BS CNS: AAO x 3, non focal EXT: No edema or tenderness    Data Reviewed:   I have personally reviewed following labs and imaging studies:  Labs: Labs show the following:   Basic Metabolic Panel: Recent Labs  Lab 01/21/23 0645 01/22/23 0615  NA 137 133*  K 3.8 4.4  CL 101 101  CO2 29 28  GLUCOSE 92 145*  BUN 21 21  CREATININE 0.74 0.75  CALCIUM 8.5* 8.5*  MG  --  2.6*   GFR Estimated Creatinine Clearance: 76.3 mL/min (by C-G formula based on SCr of 0.75 mg/dL). Liver Function Tests: Recent Labs  Lab 01/21/23 0645 01/22/23 0615  AST 28 20  ALT 31 27  ALKPHOS 95 87  BILITOT 1.3* 1.2  PROT 6.2* 6.2*  ALBUMIN 3.8 3.5   No results for input(s): "LIPASE", "AMYLASE" in the last 168 hours. No results for input(s): "AMMONIA" in the last 168 hours. Coagulation profile No results for input(s): "INR", "PROTIME" in the last  168 hours.  CBC: Recent Labs  Lab 01/21/23 0550 01/21/23 1220 01/21/23 1720 01/21/23 2054 01/22/23 0615  WBC 7.5  --   --   --  10.8*  NEUTROABS 5.2  --   --   --   --   HGB 14.7 14.1 14.7 14.2 14.4  HCT 44.8  --   --   --  42.4  MCV 93.1  --   --   --  90.6  PLT 196  --   --   --  199   Cardiac Enzymes: No results for input(s): "CKTOTAL", "CKMB", "CKMBINDEX", "TROPONINI" in the last 168 hours. BNP (last 3 results) No results for input(s): "PROBNP" in the last 8760 hours. CBG: Recent Labs  Lab 01/22/23 1200 01/22/23 1245 01/22/23 1552 01/22/23 2114 01/23/23 0808  GLUCAP 340* 297* 168* 310* 186*   D-Dimer: Recent Labs    01/21/23 1720  DDIMER <0.27   Hgb A1c: No results for input(s): "HGBA1C" in the last 72 hours. Lipid Profile: No results for input(s): "CHOL", "HDL", "LDLCALC", "TRIG", "CHOLHDL", "LDLDIRECT" in the last 72 hours. Thyroid function studies: Recent Labs    01/21/23 1220  TSH 0.564   Anemia work up: Recent Labs    01/21/23 1220  VITAMINB12 240  FOLATE 18.6  FERRITIN 97  TIBC 314  IRON 61  RETICCTPCT 1.3   Sepsis Labs: Recent Labs  Lab 01/21/23 0550 01/22/23 0615  WBC 7.5 10.8*    Microbiology Recent Results (from the past 240 hour(s))  Resp panel by RT-PCR (RSV, Flu A&B, Covid) Anterior Nasal Swab     Status: None   Collection Time: 01/21/23  5:50 AM   Specimen: Anterior Nasal Swab  Result Value Ref Range Status   SARS Coronavirus 2 by RT PCR NEGATIVE NEGATIVE Final    Comment: (NOTE) SARS-CoV-2 target nucleic acids are NOT DETECTED.  The SARS-CoV-2 RNA is generally detectable in upper respiratory specimens during the acute phase of infection. The lowest concentration of SARS-CoV-2 viral copies this assay can detect is 138 copies/mL. A negative result does not preclude SARS-Cov-2 infection and should not be used as the sole basis for treatment or other patient management decisions. A negative result may occur with   improper specimen collection/handling, submission of specimen other than nasopharyngeal swab, presence of viral mutation(s) within the areas targeted by this assay, and inadequate number of viral copies(<138 copies/mL). A negative result must be combined with clinical observations, patient history, and epidemiological information. The expected result is Negative.  Fact Sheet for Patients:  BloggerCourse.com  Fact Sheet for Healthcare Providers:  SeriousBroker.it  This test is no t yet approved or cleared by the [place] FDA and  has been authorized for detection and/or diagnosis of SARS-CoV-2 by FDA under an Emergency Use Authorization (EUA). This EUA will remain  in effect (meaning this test can be used) for the duration of the COVID-19 declaration under Section 564(b)(1) of the Act, 21 U.S.C.section 360bbb-3(b)(1), unless the authorization is terminated  or revoked sooner.       Influenza A by PCR NEGATIVE NEGATIVE Final   Influenza B by PCR NEGATIVE NEGATIVE Final    Comment: (NOTE) The Xpert Xpress SARS-CoV-2/FLU/RSV plus assay is intended as an aid in the diagnosis of influenza from Nasopharyngeal swab specimens and should not be used as a sole basis for treatment. Nasal washings and aspirates are unacceptable for Xpert Xpress SARS-CoV-2/FLU/RSV testing.  Fact Sheet for Patients: BloggerCourse.com  Fact Sheet for Healthcare Providers: SeriousBroker.it  This test is not yet approved or cleared by the [place] FDA and has been authorized for detection and/or diagnosis of SARS-CoV-2 by FDA under an Emergency Use Authorization (EUA). This EUA will remain in effect (meaning this test can be used) for the duration of the COVID-19 declaration under Section 564(b)(1) of the Act, 21 U.S.C. section 360bbb-3(b)(1), unless the authorization is terminated or revoked.      Resp Syncytial Virus by PCR NEGATIVE  NEGATIVE Final    Comment: (NOTE) Fact Sheet for Patients: BloggerCourse.com  Fact Sheet for Healthcare Providers: SeriousBroker.it  This test is not yet approved or cleared by the [place] FDA and has been authorized for detection and/or diagnosis of SARS-CoV-2 by FDA under an Emergency Use Authorization (EUA). This EUA will remain in effect (meaning this test can be used) for the duration of the COVID-19 declaration under Section 564(b)(1) of the Act, 21 U.S.C. section 360bbb-3(b)(1), unless the authorization is terminated or revoked.  Performed at [facility], [address] [place], [place] [address]   Respiratory (~20 pathogens) panel by PCR     Status: Abnormal   Collection Time: 01/22/23  3:47 AM   Specimen: Nasopharyngeal Swab; Respiratory  Result Value Ref Range Status   Adenovirus NOT DETECTED NOT DETECTED Final   Coronavirus 229E NOT DETECTED NOT DETECTED Final    Comment: (NOTE) The Coronavirus on the Respiratory Panel, DOES NOT test for the novel  Coronavirus (2019 nCoV)    Coronavirus HKU1 NOT DETECTED NOT DETECTED Final   Coronavirus NL63 NOT DETECTED NOT DETECTED Final   Coronavirus OC43 NOT DETECTED NOT DETECTED Final   Metapneumovirus NOT DETECTED NOT DETECTED Final   Rhinovirus / Enterovirus DETECTED (A) NOT DETECTED Final   Influenza A NOT DETECTED NOT DETECTED Final   Influenza B NOT DETECTED NOT DETECTED Final   Parainfluenza Virus 1 NOT DETECTED NOT DETECTED Final   Parainfluenza Virus 2 NOT DETECTED NOT DETECTED Final   Parainfluenza Virus 3 NOT DETECTED NOT DETECTED Final   Parainfluenza Virus 4 NOT DETECTED NOT DETECTED Final   Respiratory Syncytial Virus NOT DETECTED NOT DETECTED Final   Bordetella pertussis NOT DETECTED NOT DETECTED Final   Bordetella Parapertussis NOT DETECTED NOT DETECTED Final   Chlamydophila pneumoniae NOT DETECTED NOT  DETECTED Final   Mycoplasma pneumoniae NOT DETECTED NOT DETECTED Final    Comment: Performed at [facility], [address], [place], [place] [address]  Expectorated Sputum Assessment w Gram Stain, Rflx to Resp Cult     Status: None   Collection Time: 01/22/23  4:05 PM   Specimen: Sputum  Result Value Ref Range Status   Specimen Description SPUTUM  Final   Special Requests NONE  Final   Sputum evaluation   Final    THIS SPECIMEN IS ACCEPTABLE FOR SPUTUM CULTURE Performed at [facility], [address] [place], [place] [address]    Report Status 01/22/2023 FINAL  Final  Culture, Respiratory w Gram Stain     Status: None (Preliminary result)   Collection Time: 01/22/23  4:05 PM   Specimen: SPU  Result Value Ref Range Status   Specimen Description   Final    SPUTUM Performed at [facility], [address] [place], [place] [address]    Special Requests   Final    NONE Reflexed from [id number] Performed at [facility], [address] [place], [place] [address]    Gram Stain   Final    FEW WBC PRESENT,BOTH PMN AND MONONUCLEAR FEW GRAM POSITIVE COCCI IN PAIRS FEW GRAM NEGATIVE RODS Performed at [facility], [address] [place], [place] [address]    Culture PENDING  Incomplete   Report Status PENDING  Incomplete    Procedures and diagnostic studies:  No results found.             LOS: 2 days   Hildy Nicholl  [facility]   Pager on  [email or web address] If 7PM-7AM, please contact night-coverage at [email or web address]     01/23/2023, 1:11 PM

## 2023-01-23 NOTE — TOC CM/SW Note (Signed)
Transition of Care [facility]) - Inpatient Brief Assessment   Patient Details  Name: Mark Nash MRN: [id number] Date of Birth: [birth year]  Transition of [facility]) CM/SW Contact:    Truddie Hidden, RN Phone Number: 01/23/2023, 3:57 PM   Clinical Narrative: Case reviewed. TOC will continued to assess for ongoing needs and discharge planning.    Transition of Care Asessment: Insurance and Status: Insurance coverage has been reviewed Patient has primary care physician: Yes Home environment has been reviewed: Plans to return home Prior level of function:: Single family home Prior/Current Home Services: No current home services Social Determinants of Health Reivew: SDOH reviewed no interventions necessary Readmission risk has been reviewed: Yes Transition of care needs: no transition of care needs at this time

## 2023-01-24 ENCOUNTER — Ambulatory Visit: Payer: PPO | Admitting: Cardiology

## 2023-01-24 DIAGNOSIS — J9601 Acute respiratory failure with hypoxia: Secondary | ICD-10-CM | POA: Diagnosis present

## 2023-01-24 DIAGNOSIS — J441 Chronic obstructive pulmonary disease with (acute) exacerbation: Secondary | ICD-10-CM | POA: Diagnosis not present

## 2023-01-24 LAB — BASIC METABOLIC PANEL
Anion gap: 6 (ref 5–15)
BUN: 32 mg/dL — ABNORMAL HIGH (ref 8–23)
CO2: 28 mmol/L (ref 22–32)
Calcium: 9.1 mg/dL (ref 8.9–10.3)
Chloride: 99 mmol/L (ref 98–111)
Creatinine, Ser: 0.83 mg/dL (ref 0.61–1.24)
GFR, Estimated: >60 mL/min (ref >60)
Glucose, Bld: 174 mg/dL — ABNORMAL HIGH (ref 70–99)
Potassium: 4.9 mmol/L (ref 3.5–5.1)
Sodium: 133 mmol/L — ABNORMAL LOW (ref 135–145)

## 2023-01-24 LAB — CBC WITH DIFFERENTIAL/PLATELET
Abs Immature Granulocytes: 0.07 10*3/uL (ref 0.00–0.07)
Basophils Absolute: 0 10*3/uL (ref 0.0–0.1)
Basophils Relative: 0 %
Eosinophils Absolute: 0 10*3/uL (ref 0.0–0.5)
Eosinophils Relative: 0 %
HCT: 44.2 % (ref 39.0–52.0)
Hemoglobin: 14.8 g/dL (ref 13.0–17.0)
Immature Granulocytes: 1 %
Lymphocytes Relative: 8 %
Lymphs Abs: 1.2 10*3/uL (ref 0.7–4.0)
MCH: 30.5 pg (ref 26.0–34.0)
MCHC: 33.5 g/dL (ref 30.0–36.0)
MCV: 90.9 fL (ref 80.0–100.0)
Monocytes Absolute: 0.9 10*3/uL (ref 0.1–1.0)
Monocytes Relative: 6 %
Neutro Abs: 13.2 10*3/uL — ABNORMAL HIGH (ref 1.7–7.7)
Neutrophils Relative %: 85 %
Platelets: 247 10*3/uL (ref 150–400)
RBC: 4.86 MIL/uL (ref 4.22–5.81)
RDW: 13.3 % (ref 11.5–15.5)
WBC: 15.4 10*3/uL — ABNORMAL HIGH (ref 4.0–10.5)
nRBC: 0 % (ref 0.0–0.2)

## 2023-01-24 LAB — CULTURE, RESPIRATORY W GRAM STAIN

## 2023-01-24 LAB — GLUCOSE, CAPILLARY
Glucose-Capillary: 142 mg/dL — ABNORMAL HIGH (ref 70–99)
Glucose-Capillary: 94 mg/dL (ref 70–99)
Glucose-Capillary: 244 mg/dL — ABNORMAL HIGH (ref 70–99)
Glucose-Capillary: 143 mg/dL — ABNORMAL HIGH (ref 70–99)

## 2023-01-24 MED ORDER — SODIUM CHLORIDE 0.9 % IV SOLN
1.0000 g | INTRAVENOUS | Status: DC
  Administered 2023-01-24 – 2023-01-25 (×2): 1 g via INTRAVENOUS
  Filled 2023-01-24 (×2): qty 10

## 2023-01-24 MED ORDER — MAGNESIUM CITRATE PO SOLN: 1.0000 | Freq: Once | ORAL | Status: DC | PRN

## 2023-01-24 MED ORDER — BISACODYL 10 MG RE SUPP
10.0000 mg | Freq: Every day | RECTAL | Status: DC | PRN
  Filled 2023-01-24: qty 1

## 2023-01-24 MED ORDER — BISACODYL 5 MG PO TBEC
5.0000 mg | DELAYED_RELEASE_TABLET | Freq: Every day | ORAL | Status: DC | PRN
  Administered 2023-01-24 – 2023-01-25 (×2): 5 mg via ORAL
  Filled 2023-01-24 (×2): qty 1

## 2023-01-24 MED ORDER — SENNOSIDES-DOCUSATE SODIUM 8.6-50 MG PO TABS
1.0000 | ORAL_TABLET | Freq: Two times a day (BID) | ORAL | Status: DC
  Administered 2023-01-24 – 2023-01-25 (×4): 1 via ORAL
  Filled 2023-01-24 (×4): qty 1

## 2023-01-24 NOTE — Care Management Important Message (Signed)
Important Message  Patient Details  Name: Mark Nash MRN: [id number] Date of Birth: 27-Jan-1956   Medicare Important Message Given:  Other (see comment)  Attempted to reach patient via room phone [phone]) to review Medicare IM.  Unable to reach upon attempt.   Johnell Comings 01/24/2023, 11:40 AM

## 2023-01-24 NOTE — Plan of Care (Signed)

## 2023-01-24 NOTE — Plan of Care (Signed)
Patient is participating with goals of care to meet goals for discharge.  Terrilyn Saver, RN    Problem: Education: Goal: Knowledge of disease or condition will improve Outcome: Progressing Goal: Knowledge of the prescribed therapeutic regimen will improve Outcome: Progressing Goal: Individualized Educational Video(s) Outcome: Progressing   Problem: Activity: Goal: Ability to tolerate increased activity will improve Outcome: Progressing Goal: Will verbalize the importance of balancing activity with adequate rest periods Outcome: Progressing   Problem: Respiratory: Goal: Ability to maintain a clear airway will improve Outcome: Progressing Goal: Levels of oxygenation will improve Outcome: Progressing Goal: Ability to maintain adequate ventilation will improve Outcome: Progressing   Problem: Education: Goal: Ability to describe self-care measures that may prevent or decrease complications (Diabetes Survival Skills Education) will improve Outcome: Progressing Goal: Individualized Educational Video(s) Outcome: Progressing   Problem: Coping: Goal: Ability to adjust to condition or change in health will improve Outcome: Progressing   Problem: Fluid Volume: Goal: Ability to maintain a balanced intake and output will improve Outcome: Progressing   Problem: Health Behavior/Discharge Planning: Goal: Ability to identify and utilize available resources and services will improve Outcome: Progressing Goal: Ability to manage health-related needs will improve Outcome: Progressing   Problem: Metabolic: Goal: Ability to maintain appropriate glucose levels will improve Outcome: Progressing   Problem: Nutritional: Goal: Maintenance of adequate nutrition will improve Outcome: Progressing Goal: Progress toward achieving an optimal weight will improve Outcome: Progressing   Problem: Skin Integrity: Goal: Risk for impaired skin integrity will decrease Outcome: Progressing    Problem: Tissue Perfusion: Goal: Adequacy of tissue perfusion will improve Outcome: Progressing   Problem: Education: Goal: Knowledge of General Education information will improve Description: Including pain rating scale, medication(s)/side effects and non-pharmacologic comfort measures Outcome: Progressing   Problem: Health Behavior/Discharge Planning: Goal: Ability to manage health-related needs will improve Outcome: Progressing   Problem: Clinical Measurements: Goal: Ability to maintain clinical measurements within normal limits will improve Outcome: Progressing Goal: Will remain free from infection Outcome: Progressing Goal: Diagnostic test results will improve Outcome: Progressing Goal: Respiratory complications will improve Outcome: Progressing Goal: Cardiovascular complication will be avoided Outcome: Progressing   Problem: Activity: Goal: Risk for activity intolerance will decrease Outcome: Progressing   Problem: Nutrition: Goal: Adequate nutrition will be maintained Outcome: Progressing   Problem: Coping: Goal: Level of anxiety will decrease Outcome: Progressing   Problem: Elimination: Goal: Will not experience complications related to bowel motility Outcome: Progressing Goal: Will not experience complications related to urinary retention Outcome: Progressing   Problem: Pain Managment: Goal: General experience of comfort will improve Outcome: Progressing   Problem: Safety: Goal: Ability to remain free from injury will improve Outcome: Progressing   Problem: Skin Integrity: Goal: Risk for impaired skin integrity will decrease Outcome: Progressing

## 2023-01-24 NOTE — Progress Notes (Addendum)
Progress Note    Mark Nash  ZOX:[id number] DOB: [birth year]  DOA: 01/21/2023 PCP: Alba Cory, MD      Brief Narrative:    Medical records reviewed and are as summarized below:  Mark Nash is a 67 y.o. male  with medical history significant of  hypertension,HLD, type 2 diabetes, PAD, COPD/asthma not O2 dependent, atrial fibrillation on chronic anticoagulation with apixaban, CHFef 40-45%, hepc C, schizoaffective/schizophrenia disorder, tobacco use, who has interim history of visit to ED on 5/18 with similar complaints of shortness of breath.  He was diagnosed with COPD exacerbation and discharge to home prednisone and doxycycline at that time.  He presented to the hospital again on 01/21/2023 with cough, congestion and shortness of breath.  He also reported some dark stools about 3 days prior to admission.   He was admitted to the hospital for COPD exacerbation.  He was treated with steroids, bronchodilators and empiric antibiotics.  Respiratory viral panel was positive for rhinovirus which probably triggered COPD exacerbation.    Assessment/Plan:   Principal Problem:   COPD exacerbation (HCC) Active Problems:   Paranoid schizophrenia (HCC)   Peripheral vascular disease (HCC)   Type 2 diabetes mellitus with diabetic polyneuropathy, with long-term current use of insulin (HCC)   Rhinovirus infection   COPD exacerbation / Rhinovirus infection / ?Secondary Bacterial PNA:  Continue prednisone and bronchodilators.   Completed 3 days of azithromycin on 01/24/2023.   Start Rocephin given sputum culture gram stain (5/29) Follow sputum cultures to final Continue to use flutter valve as needed.   He is tolerating room air, not hypoxic - monitor closely. Respiratory viral panel was positive for rhinovirus.   Droplet precautions.  Acute respirtatory failure with hypoxia -- resolved. Per EDP documentation, arrived hypoxic with sats in 80's on room air.  Now on room  air. Mgmt as above.  Supplement o2 if needed to keep sats > 88%  Paroxysmal atrial fibrillation/atrial flutter with RVR: Heart rate is better.  Discontinue IV Cardizem drip.  Continue oral Cardizem.  Restart Eliquis.   Recent black stools: No acute overt bleeding at this time.  H&H is stable.  Restart Eliquis.  Discontinue IV Protonix.  Repeat CBC tomorrow.  Chronic systolic CHF: Compensated.  2D echo in January 2024 showed EF estimated at 40 to 45%.  Type II DM with hyperglycemia: Continue insulin glargine and Farxiga.  Add NovoLog 4 units 3 times daily with meals  Constipation -- bowel regimen per orders.   Other comorbidities include hypertension, PVD, hyperlipidemia, schizoaffective disorder, history of hepatitis C, lumbar degenerative disc disease    Consultants: None  Procedures: None     Family Communication/Anticipated D/C date and plan/Code Status   DVT prophylaxis: SCDs Start: 01/21/23 0909 apixaban (ELIQUIS) tablet 5 mg     Code Status: Full Code  Family Communication: None Disposition Plan: Plan to discharge home tomorrow   Status is: Inpatient Remains inpatient appropriate because: COPD exacerbation    Subjective:   Pt awake sitting up in bed this AM.  Reports ongoing cough and dyspnea that is aggravated by any attempt at mobility or exertion.  Reports no BM in a week despite taking Miralax here.  Has associated abdominal pain.    Objective:    Vitals:   01/24/23 0537 01/24/23 0700 01/24/23 1154 01/24/23 1200  BP:  133/79 122/73 (!) 149/89  Pulse:  (!) 50  (!) 50  Resp:  15  (!) 25  Temp: 97.7 F (36.5 C)  97.7 F (36.5 C)  97.6 F (36.4 C)  TempSrc:  Oral  Oral  SpO2:  96%  95%  Weight: 64.5 kg      No data found.   Intake/Output Summary (Last 24 hours) at 01/24/2023 1335 Last data filed at 01/24/2023 1200 Gross per 24 hour  Intake 480 ml  Output 1625 ml  Net -1145 ml   Filed Weights   01/22/23 0544 01/23/23 0300 01/24/23 0537   Weight: 59.1 kg 60.2 kg 64.5 kg    Exam:  General exam: awake, alert, no acute distress HEENT: atraumatic, clear conjunctiva, anicteric sclera, moist mucus membranes, hearing grossly normal  Respiratory system: rhonchi and wheezes, normal respiratory effort at rest. Cardiovascular system: normal S1/S2, RRR, no pedal edema.   Gastrointestinal system: soft, NT, ND, hypoactive bowel sounds. Central nervous system: A&O x 3. no gross focal neurologic deficits, normal speech Extremities: moves all, no edema, normal tone Skin: dry, intact, normal temperature Psychiatry: normal mood, congruent affect     Data Reviewed:   Notable labs --- Na 133, glucose 174, BUN 32, WBC 10.8 >> 15.4   Micro --  Sputum culture growing GCP's in pairs, GNR's - pending    LOS: 3 days   Pennie Banter, DO  [facility]   Pager on [email or web address] If 7PM-7AM, please contact night-coverage at [email or web address]     01/24/2023, 1:35 PM

## 2023-01-25 DIAGNOSIS — I4892 Unspecified atrial flutter: Secondary | ICD-10-CM | POA: Diagnosis not present

## 2023-01-25 DIAGNOSIS — I5020 Unspecified systolic (congestive) heart failure: Secondary | ICD-10-CM

## 2023-01-25 DIAGNOSIS — J441 Chronic obstructive pulmonary disease with (acute) exacerbation: Secondary | ICD-10-CM | POA: Diagnosis not present

## 2023-01-25 LAB — CBC
HCT: 46.1 % (ref 39.0–52.0)
Hemoglobin: 15.5 g/dL (ref 13.0–17.0)
MCH: 30.5 pg (ref 26.0–34.0)
MCHC: 33.6 g/dL (ref 30.0–36.0)
MCV: 90.7 fL (ref 80.0–100.0)
Platelets: 272 10*3/uL (ref 150–400)
RBC: 5.08 MIL/uL (ref 4.22–5.81)
RDW: 13.4 % (ref 11.5–15.5)
WBC: 11.7 10*3/uL — ABNORMAL HIGH (ref 4.0–10.5)
nRBC: 0 % (ref 0.0–0.2)

## 2023-01-25 LAB — CULTURE, RESPIRATORY W GRAM STAIN: Culture: NORMAL

## 2023-01-25 LAB — GLUCOSE, CAPILLARY
Glucose-Capillary: 142 mg/dL — ABNORMAL HIGH (ref 70–99)
Glucose-Capillary: 80 mg/dL (ref 70–99)
Glucose-Capillary: 293 mg/dL — ABNORMAL HIGH (ref 70–99)
Glucose-Capillary: 174 mg/dL — ABNORMAL HIGH (ref 70–99)

## 2023-01-25 MED ORDER — POLYETHYLENE GLYCOL 3350 17 G PO PACK
34.0000 g | PACK | Freq: Every day | ORAL | Status: DC
  Administered 2023-01-26 – 2023-01-30 (×2): 34 g via ORAL
  Filled 2023-01-25 (×5): qty 2

## 2023-01-25 MED ORDER — METOPROLOL TARTRATE 25 MG PO TABS
12.5000 mg | ORAL_TABLET | Freq: Four times a day (QID) | ORAL | Status: DC
  Administered 2023-01-25 – 2023-01-26 (×5): 12.5 mg via ORAL
  Filled 2023-01-25 (×5): qty 1

## 2023-01-25 MED ORDER — POLYETHYLENE GLYCOL 3350 17 G PO PACK
17.0000 g | PACK | Freq: Once | ORAL | Status: AC
  Administered 2023-01-26: 17 g via ORAL
  Filled 2023-01-25: qty 1

## 2023-01-25 MED ORDER — METOPROLOL TARTRATE 5 MG/5ML IV SOLN
5.0000 mg | INTRAVENOUS | Status: DC | PRN
  Filled 2023-01-25: qty 5

## 2023-01-25 MED ORDER — DILTIAZEM HCL 25 MG/5ML IV SOLN
10.0000 mg | Freq: Once | INTRAVENOUS | Status: AC
  Administered 2023-01-25: 10 mg via INTRAVENOUS
  Filled 2023-01-25: qty 5

## 2023-01-25 NOTE — Plan of Care (Signed)
Patient is participating in goals of care to meet goals for discharge.  Terrilyn Saver, RN   Problem: Education: Goal: Knowledge of disease or condition will improve Outcome: Progressing Goal: Knowledge of the prescribed therapeutic regimen will improve Outcome: Progressing Goal: Individualized Educational Video(s) Outcome: Progressing   Problem: Activity: Goal: Ability to tolerate increased activity will improve Outcome: Progressing Goal: Will verbalize the importance of balancing activity with adequate rest periods Outcome: Progressing   Problem: Respiratory: Goal: Ability to maintain a clear airway will improve Outcome: Progressing Goal: Levels of oxygenation will improve Outcome: Progressing Goal: Ability to maintain adequate ventilation will improve Outcome: Progressing   Problem: Education: Goal: Ability to describe self-care measures that may prevent or decrease complications (Diabetes Survival Skills Education) will improve Outcome: Progressing Goal: Individualized Educational Video(s) Outcome: Progressing   Problem: Coping: Goal: Ability to adjust to condition or change in health will improve Outcome: Progressing   Problem: Fluid Volume: Goal: Ability to maintain a balanced intake and output will improve Outcome: Progressing   Problem: Health Behavior/Discharge Planning: Goal: Ability to identify and utilize available resources and services will improve Outcome: Progressing Goal: Ability to manage health-related needs will improve Outcome: Progressing   Problem: Metabolic: Goal: Ability to maintain appropriate glucose levels will improve Outcome: Progressing   Problem: Nutritional: Goal: Maintenance of adequate nutrition will improve Outcome: Progressing Goal: Progress toward achieving an optimal weight will improve Outcome: Progressing   Problem: Skin Integrity: Goal: Risk for impaired skin integrity will decrease Outcome: Progressing    Problem: Tissue Perfusion: Goal: Adequacy of tissue perfusion will improve Outcome: Progressing   Problem: Education: Goal: Knowledge of General Education information will improve Description: Including pain rating scale, medication(s)/side effects and non-pharmacologic comfort measures Outcome: Progressing   Problem: Health Behavior/Discharge Planning: Goal: Ability to manage health-related needs will improve Outcome: Progressing   Problem: Clinical Measurements: Goal: Ability to maintain clinical measurements within normal limits will improve Outcome: Progressing Goal: Will remain free from infection Outcome: Progressing Goal: Diagnostic test results will improve Outcome: Progressing Goal: Respiratory complications will improve Outcome: Progressing Goal: Cardiovascular complication will be avoided Outcome: Progressing   Problem: Activity: Goal: Risk for activity intolerance will decrease Outcome: Progressing   Problem: Nutrition: Goal: Adequate nutrition will be maintained Outcome: Progressing   Problem: Coping: Goal: Level of anxiety will decrease Outcome: Progressing   Problem: Elimination: Goal: Will not experience complications related to bowel motility Outcome: Progressing Goal: Will not experience complications related to urinary retention Outcome: Progressing   Problem: Pain Managment: Goal: General experience of comfort will improve Outcome: Progressing   Problem: Safety: Goal: Ability to remain free from injury will improve Outcome: Progressing   Problem: Skin Integrity: Goal: Risk for impaired skin integrity will decrease Outcome: Progressing

## 2023-01-25 NOTE — Consult Note (Signed)
Cardiology Consultation   Patient ID: KEY BRULE MRN: [id number]; DOB: Mar 05, 1956  Admit date: 01/21/2023 Date of Consult: 01/25/2023  PCP:  Alba Cory, MD   [facility] Providers Cardiologist:  Julien Nordmann, MD  Electrophysiologist:  Lanier Prude, MD  {   Patient Profile:   Mark Nash is a 67 y.o. male with a hx of HTN, HLD, DM2, PAD, COPD/asthma not O2 dependent, persistent afib/flutter on Eliquis, HFmrEF, hep C, schizoaffective/schizophrenia disorder, tobacco use who is being seen 01/25/2023 for the evaluation of rapid afib/flutter at the request of Dr. Denton Lank.  History of Present Illness:   Mr. Mikel is followed by Dr. Mariah Milling and Dr. Lalla Brothers.   The patient AF diagnosis goes back to July 2022. He underwent unsuccessful cardioversion 04/2021. He saw EP in 2022 and was scheduled for an ablation, but this was canceled due to lower extremity wounds. He later followed up and was not felt to be a good candidate for ablation. He has been rate controlled on diltiazem and stroke ppx with Eliquis.   He has a history of HFmrEF. Echo in 06/2019 showed 55-60%. Echo in 08/2022 showed LVEF 45-50%. He was scheduled for a myoview lexiscan 12/2022 that was difficult to read. Overall abnormal with probably low risk with possible artifact vs scar, no evidence of ischemia. LVEF 35-45%. Coronary artery calcification present.   He was seen in the ER 01/13/23 for COPD exacerbation. EKG showed rapid aflutter 130bpm. He was given IV metoprolol and home dilt. Treated with duonebs and prednisone and sent home with doxy.  The patient presented  back to [facility] 01/21/23 with shortness of breath admitted with acute respiratory failure, COPD exacerbation, Rhinovirus infection and possible PNA. He is in afib/flutter and heart rate has been hard to control, cardiology was asked to see.   Telemetry shows Afib/flutter with rates in the 140s. Patient is fairly asymptomatic. He denies chest  pain or palpitations. He has persistent shortness of breath on supplemental O2 from pulmonary issues.   Recent labs: WBC 11.7, Hgb 15.5, plt 272k K 4.9, Scr 0.83, BUN 32 Mag 2.6 TSH 0.564  Past Medical History:  Diagnosis Date   Arthritis    Asthma    Benign neoplasm of ascending colon    polyps   Constipation    COPD (chronic obstructive pulmonary disease) (HCC)    NO INHALER   Diabetes mellitus without complication (HCC)    Type 2   Diverticulosis    Dyspnea    RARE-WITH EXERTION DUE TO COPD   GERD (gastroesophageal reflux disease)    Hepatitis C    History of kidney stones    H/O   Hyperlipidemia    Hypertension 06/10/2018   currently not under control   Left inguinal hernia    Peripheral vascular disease (HCC)    Restless legs    Schizoaffective disorder (HCC)    Pt denies   Thrombocytopenia (HCC)    Vitamin D deficiency    Wears dentures    full upper and lower    Past Surgical History:  Procedure Laterality Date   BACK SURGERY     CARDIOVERSION N/A 05/20/2021   Procedure: CARDIOVERSION;  Surgeon: Iran Ouch, MD;  Location: [facility] ORS;  Service: Cardiovascular;  Laterality: N/A;   CATARACT EXTRACTION W/PHACO Right 10/31/2021   Procedure: CATARACT EXTRACTION PHACO AND INTRAOCULAR LENS PLACEMENT (IOC) RIGHT DIABETIC;  Surgeon: Nevada Crane, MD;  Location: [facility];  Service: Ophthalmology;  Laterality: Right;  Diabetic 4.34 00:42.8   COLONOSCOPY WITH PROPOFOL N/A 09/07/2017   Procedure: COLONOSCOPY WITH PROPOFOL;  Surgeon: Pasty Spillers, MD;  Location: [facility];  Service: Endoscopy;  Laterality: N/A;   ESOPHAGOGASTRODUODENOSCOPY (EGD) WITH PROPOFOL N/A 12/07/2017   Procedure: ESOPHAGOGASTRODUODENOSCOPY (EGD) WITH PROPOFOL;  Surgeon: Pasty Spillers, MD;  Location: [facility] ENDOSCOPY;  Service: Endoscopy;  Laterality: N/A;   EYE SURGERY     FLEXIBLE SIGMOIDOSCOPY N/A 11/09/2017   Procedure: FLEXIBLE SIGMOIDOSCOPY;  Surgeon:  Pasty Spillers, MD;  Location: [facility] ENDOSCOPY;  Service: Endoscopy;  Laterality: N/A;   HERNIA REPAIR     inguinal   INGUINAL HERNIA REPAIR Bilateral 06/21/2018   Procedure: LAPAROSCOPIC BILATERAL INGUINAL HERNIA REPAIR;  Surgeon: Ancil Linsey, MD;  Location: [facility] ORS;  Service: General;  Laterality: Bilateral;   L3 TO L5 LAMINECTOMY FOR DECOMPRESSION  07/17/2016   [facility]   REPAIR OF CEREBROSPINAL FLUID LEAK N/A 09/08/2016   Procedure: Lumbar wound exploration, repair of pseudomenigocele;  Surgeon: Loura Halt Ditty, MD;  Location: [facility] OR;  Service: Neurosurgery;  Laterality: N/A;  Lumbar wound exploration, repair of pseudomenigocele   SINUSOTOMY       Home Medications:  Prior to Admission medications   Medication Sig Start Date End Date Taking? Authorizing Provider  apixaban (ELIQUIS) 5 MG TABS tablet Take 1 tablet by mouth twice daily 11/27/22  Yes Gollan, Tollie Pizza, MD  Budeson-Glycopyrrol-Formoterol (BREZTRI AEROSPHERE) 160-9-4.8 MCG/ACT AERO Inhale 2 puffs into the lungs 2 (two) times daily. Patient receives via [place] Patient Assistance. 09/19/22  Yes Sowles, Danna Hefty, MD  dapagliflozin propanediol (FARXIGA) 10 MG TABS tablet Take 1 tablet (10 mg total) by mouth daily. 11/30/22  Yes Sowles, Danna Hefty, MD  diltiazem (DILT-XR) 240 MG 24 hr capsule Take 1 capsule (240 mg total) by mouth daily. 08/23/22  Yes Hammock, Sheri, NP  gabapentin (NEURONTIN) 300 MG capsule Take 300-600 mg by mouth See admin instructions. Take 1 capsule (300 mg) by mouth in the morning, take 1 capsule (300 mg) by mouth at noon, and 2 capsules (600 mg) by mouth in the evening   Yes [provider]  LEVEMIR 100 UNIT/ML injection INJECT 8 UNITS SUBCUTANEOUSLY AT BEDTIME Patient taking differently: Inject 8 Units into the skin at bedtime. 01/08/23  Yes Sowles, Danna Hefty, MD  potassium chloride (KLOR-CON) 10 MEQ tablet Take 1 tablet (10 mEq total) by mouth every Monday, Wednesday, and  Friday. 09/04/22 01/21/23 Yes Hammock, Lavonna Rua, NP  pravastatin (PRAVACHOL) 20 MG tablet Take 1 tablet (20 mg total) by mouth daily with supper. 07/17/22  Yes Sowles, Danna Hefty, MD  tiZANidine (ZANAFLEX) 4 MG tablet Take 4 mg by mouth every 12 (twelve) hours.   Yes Freda Munro A, MD  traMADol (ULTRAM) 50 MG tablet Take 50 mg by mouth every 12 (twelve) hours.   Yes Yevonne Pax, MD  Vitamin D, Ergocalciferol, (DRISDOL) 1.25 MG (50000 UNIT) CAPS capsule TAKE 1 CAPSULE BY MOUTH EVERY TWO WEEKS 12/27/22  Yes Sowles, Danna Hefty, MD  blood glucose meter kit and supplies KIT Dispense based on patient and insurance preference. Use up to tid E11.65, LON:99 05/10/22   Alba Cory, MD  guaiFENesin (MUCINEX) 600 MG 12 hr tablet Take 600 mg by mouth 2 (two) times daily as needed for to loosen phlegm.    [provider]  Lancet Devices MISC 1 each by Does not apply route 3 (three) times daily as needed. What ins will cover dx:E11.65, LON:99 tid 09/19/21   Margarita Mail, DO  losartan (COZAAR) 25 MG tablet Take 0.5 tablets (12.5 mg total) by mouth daily. 11/24/22   Charlsie Quest, NP  sulfamethoxazole-trimethoprim (BACTRIM DS) 800-160 MG tablet Take 1 tablet by mouth 2 (two) times daily. Patient not taking: Reported on 01/21/2023 12/18/22   Alba Cory, MD    Inpatient Medications: Scheduled Meds:  apixaban  5 mg Oral BID   dapagliflozin propanediol  10 mg Oral Daily   diltiazem  240 mg Oral Daily   fluticasone furoate-vilanterol  1 puff Inhalation Daily   And   umeclidinium bromide  1 puff Inhalation Daily   gabapentin  300 mg Oral q AM   And   gabapentin  300 mg Oral Q1200   And   gabapentin  600 mg Oral QHS   insulin aspart  0-9 Units Subcutaneous TID WC   insulin aspart  4 Units Subcutaneous TID WC   insulin detemir  8 Units Subcutaneous QHS   ipratropium-albuterol  3 mL Nebulization BID   polyethylene glycol  17 g Oral Daily   pravastatin  20 mg Oral QHS   senna-docusate  1 tablet Oral  BID   traMADol  50 mg Oral Q12H   Continuous Infusions:  cefTRIAXone (ROCEPHIN)  IV Stopped (01/24/23 1613)   PRN Meds: bisacodyl, bisacodyl, guaiFENesin, levalbuterol, magnesium citrate, ondansetron **OR** ondansetron (ZOFRAN) IV  Allergies:    Allergies  Allergen Reactions   Penicillins Hives and Swelling    As a child. Has patient had a PCN reaction causing immediate rash, facial/tongue/throat swelling, SOB or lightheadedness with hypotension: YES  Has patient had a PCN reaction causing severe rash involving mucus membranes or skin necrosis: UNKNOWN Has patient had a PCN reaction that required hospitalization: UNKNOWN Has patient had a PCN reaction occurring within the last 10 years: NO   Aspirin Nausea Only   Ibuprofen Nausea Only   Lyrica [Pregabalin] Nausea Only   Acetaminophen Nausea Only    Social History:   Social History   Socioeconomic History   Marital status: Divorced    Spouse name: Not on file   Number of children: 2   Years of education: Not on file   Highest education level: 12th grade  Occupational History   Occupation: Disabled  Tobacco Use   Smoking status: Every Day    Packs/day: 1.50    Years: 46.00    Additional pack years: 0.00    Total pack years: 69.00    Types: Cigarettes   Smokeless tobacco: Never  Vaping Use   Vaping Use: Never used  Substance and Sexual Activity   Alcohol use: No    Alcohol/week: 0.0 standard drinks of alcohol   Drug use: No   Sexual activity: Not Currently  Other Topics Concern   Not on file  Social History Narrative   Lives alone   Social Determinants of Health   Financial Resource Strain: Low Risk  (09/29/2022)   Overall Financial Resource Strain (CARDIA)    Difficulty of Paying Living Expenses: Not hard at all  Food Insecurity: No Food Insecurity (01/21/2023)   Hunger Vital Sign    Worried About Running Out of Food in the Last Year: Never true    Ran Out of Food in the Last Year: Never true  Transportation  Needs: No Transportation Needs (01/21/2023)   PRAPARE - Administrator, Civil Service (Medical): No    Lack of Transportation (Non-Medical): No  Physical Activity: Insufficiently Active (09/29/2022)   Exercise Vital Sign    Days of  Exercise per Week: 1 day    Minutes of Exercise per Session: 20 min  Stress: No Stress Concern Present (09/29/2022)   [employer] of Occupational Health - Occupational Stress Questionnaire    Feeling of Stress : Not at all  Social Connections: Unknown (09/29/2022)   Social Connection and Isolation Panel [NHANES]    Frequency of Communication with Friends and Family: Once a week    Frequency of Social Gatherings with Friends and Family: Not on file    Attends Religious Services: Never    Database administrator or Organizations: No    Attends Banker Meetings: Never    Marital Status: Divorced  Catering manager Violence: Not At Risk (01/21/2023)   Humiliation, Afraid, Rape, and Kick questionnaire    Fear of Current or Ex-Partner: No    Emotionally Abused: No    Physically Abused: No    Sexually Abused: No    Family History:    Family History  Problem Relation Age of Onset   Heart failure Mother    Hypertension Mother    Diverticulitis Mother    Colitis Mother    Diabetes Father    Hypertension Father    Heart disease Father    Diabetes Sister    Heart failure Brother    Diverticulitis Brother      ROS:  Please see the history of present illness.   All other ROS reviewed and negative.     Physical Exam/Data:   Vitals:   01/25/23 0417 01/25/23 0529 01/25/23 0834 01/25/23 1153  BP:  (!) 141/88 (!) 149/78 135/89  Pulse:  72 69   Resp:  18 20 18   Temp:  98.3 F (36.8 C) 98 F (36.7 C) 97.7 F (36.5 C)  TempSrc:  Axillary  Oral  SpO2:  98% 94% 96%  Weight: 64.2 kg       Intake/Output Summary (Last 24 hours) at 01/25/2023 1307 Last data filed at 01/25/2023 1157 Gross per 24 hour  Intake 720 ml  Output 3450 ml   Net -2730 ml      01/25/2023    4:17 AM 01/24/2023    5:37 AM 01/23/2023    3:00 AM  Last 3 Weights  Weight (lbs) 141 lb 8.6 oz 142 lb 3.2 oz 132 lb 11.5 oz  Weight (kg) 64.2 kg 64.5 kg 60.2 kg     Body mass index is 19.74 kg/m.  General:  Well nourished, well developed, in no acute distress HEENT: normal Neck: no JVD Vascular: No carotid bruits; Distal pulses 2+ bilaterally Cardiac:  normal S1, S2; Irreg Irreg, tachy; no murmur  Lungs:  diffusely diminished Abd: soft, nontender, no hepatomegaly  Ext: no edema Musculoskeletal:  No deformities, BUE and BLE strength normal and equal Skin: warm and dry  Neuro:  CNs 2-12 intact, no focal abnormalities noted Psych:  Normal affect   EKG:  The EKG was personally reviewed and demonstrates:  Afib/flutter 118bpm Telemetry:  Telemetry was personally reviewed and demonstrates:  Afib/flutter Hr 70s>140s more recently  Relevant CV Studies:  Myoview lexiscan 12/2022 Narrative & Impression      Sensitivity and specificity of the study are significant reduced due to artifact.  The resting images are uninterpretable due to severe extracardiac activity.   Abnormal, probably low risk pharmacologic myocardial.   There is a small in size, mild in severity, fixed apical anterior and apical defect most consistent with artifact but cannot rule out an element of scar.   There  is no evidence of significant ischemia.   Left ventricular systolic function is moderately reduced (LVEF 35-45%).   Coronary artery calcification and aortic atherosclerosis are present.   No significant change from prior study on 05/12/2021 allowing for differences in artifact.    Echo 08/2022  1. Left ventricular ejection fraction, by estimation, is 45 to 50%. The  left ventricle has mildly decreased function. The left ventricle  demonstrates global hypokinesis. The left ventricular internal cavity size  was mildly dilated. Left ventricular  diastolic parameters are  indeterminate.   2. Right ventricular systolic function is normal. The right ventricular  size is not well visualized.   3. Left atrial size was mildly dilated.   4. Right atrial size was mildly dilated.   5. The mitral valve is grossly normal. No evidence of mitral valve  regurgitation.   6. The aortic valve is calcified. Aortic valve regurgitation is not  visualized.   7. The inferior vena cava is dilated in size with <50% respiratory  variability, suggesting right atrial pressure of 15 mmHg.   8. Rhythm strip during this exam demonstrates atrial flutter.   Comparison(s): 06/28/21 EF 55-60%.   Echo 06/2021 1. Left ventricular ejection fraction, by estimation, is 55 to 60%. The  left ventricle has normal function. The left ventricle has no regional  wall motion abnormalities. Left ventricular diastolic parameters are  indeterminate.   2. Right ventricular systolic function is normal. The right ventricular  size is normal.   3. Left atrial size was mildly dilated.   4. The mitral valve was not well visualized. No evidence of mitral valve  regurgitation. No evidence of mitral stenosis.   5. The aortic valve was not well visualized. Aortic valve regurgitation  is not visualized. Mild to moderate aortic valve sclerosis/calcification  is present, without any evidence of aortic stenosis.   6. There is dilatation of the ascending aorta, measuring 37 mm. There is  dilatation of the aortic root, measuring 36 mm.   7. The inferior vena cava is normal in size with greater than 50%  respiratory variability, suggesting right atrial pressure of 3 mmHg.   Comparison(s): EF 55%, technically challenging due to limited acoustic  windows.   Laboratory Data:  High Sensitivity Troponin:   Recent Labs  Lab 01/13/23 0255 01/13/23 0431  TROPONINIHS 19* 24*     Chemistry Recent Labs  Lab 01/21/23 0645 01/22/23 0615 01/24/23 0521  NA 137 133* 133*  K 3.8 4.4 4.9  CL 101 101 99  CO2 29 28  28   GLUCOSE 92 145* 174*  BUN 21 21 32*  CREATININE 0.74 0.75 0.83  CALCIUM 8.5* 8.5* 9.1  MG  --  2.6*  --   GFRNONAA >60 >60 >60  ANIONGAP 7 4* 6    Recent Labs  Lab 01/21/23 0645 01/22/23 0615  PROT 6.2* 6.2*  ALBUMIN 3.8 3.5  AST 28 20  ALT 31 27  ALKPHOS 95 87  BILITOT 1.3* 1.2   Lipids No results for input(s): "CHOL", "TRIG", "HDL", "LABVLDL", "LDLCALC", "CHOLHDL" in the last 168 hours.  Hematology Recent Labs  Lab 01/22/23 0615 01/24/23 0521 01/25/23 0604  WBC 10.8* 15.4* 11.7*  RBC 4.68 4.86 5.08  HGB 14.4 14.8 15.5  HCT 42.4 44.2 46.1  MCV 90.6 90.9 90.7  MCH 30.8 30.5 30.5  MCHC 34.0 33.5 33.6  RDW 13.3 13.3 13.4  PLT 199 247 272   Thyroid  Recent Labs  Lab 01/21/23 1220  TSH 0.564  BNP Recent Labs  Lab 01/21/23 0550  BNP 19.7    DDimer  Recent Labs  Lab 01/21/23 1720  DDIMER <0.27     Radiology/Studies:  No results found.   Assessment and Plan:   Rapid Afib/flutter H/o persistent Afib/flutter  - Rapid afib/flutter in the setting of acute respiratory failure with hypoxia, COPD exacerbation, Rhinovirus, possible PNA - h/o persistent Afib flutter with prior failed cardioversion. Seen by EP and not felt to be a good ablation candidate - has been rate controlled on diltiazem 240mg  - Eliquis 5mg  BID for stroke ppx - CHADSVASC at least 4 (HTN, DM2, PAD, age) - he is in aflutter/fib with rates in the 140s on diltiazem 2401mg  daily (Not the best with reduced EF) - start metoprolol 12.5mg  BID>can titrate as able - expect rates will improve as pulmonary issues resolve - If rates continue to be uncontrolled, would consider EP consults  HFmrEF - Echo 2022 with normal LVEF - Echo in 08/2022 showed LVEF 45-50% - Myoview lexi was overall nonischemic - patient appears euvolemic - re-check limited echo - PTA Losartan and Farxiga. ARB held on admission  HTN - Bps good  Acute respiratory failure Acute COPD exacerbation, Rhinovirus,  ?PNA - abx per IM - on supplemental O2, not on O2 at baseline  For questions or updates, please contact [facility] Please consult [email or web address] for contact info under    Signed, Ezrah Panning David Stall, PA-C  01/25/2023 1:07 PM

## 2023-01-25 NOTE — Care Management Important Message (Signed)
Important Message  Patient Details  Name: XZAYVION FLAIM MRN: [id number] Date of Birth: Jun 11, 1956   Medicare Important Message Given:  Yes  Reviewed Medicare IM with patient via room phone [phone]).  Copy of Medicare IM to be delivered to patient via nursing staff due to current isolation status.     Johnell Comings 01/25/2023, 11:46 AM

## 2023-01-25 NOTE — Progress Notes (Signed)
Progress Note    Mark Nash  ZOX:[id number] DOB: 12/06/1955  DOA: 01/21/2023 PCP: Mark Cory, MD      Brief Narrative:    Medical records reviewed and are as summarized below:  Mark Nash is a 67 y.o. male  with medical history significant of  hypertension,HLD, type 2 diabetes, PAD, COPD/asthma not O2 dependent, atrial fibrillation on chronic anticoagulation with apixaban, CHFef 40-45%, hepc C, schizoaffective/schizophrenia disorder, tobacco use, who has interim history of visit to ED on 5/18 with similar complaints of shortness of breath.  He was diagnosed with COPD exacerbation and discharge to home prednisone and doxycycline at that time.  He presented to the hospital again on 01/21/2023 with cough, congestion and shortness of breath.  He also reported some dark stools about 3 days prior to admission.   He was admitted to the hospital for COPD exacerbation.  He was treated with steroids, bronchodilators and empiric antibiotics.  Respiratory viral panel was positive for rhinovirus which probably triggered COPD exacerbation.    Assessment/Plan:   Principal Problem:   COPD exacerbation (HCC) Active Problems:   Paranoid schizophrenia (HCC)   Peripheral vascular disease (HCC)   Type 2 diabetes mellitus with diabetic polyneuropathy, with long-term current use of insulin (HCC)   Rhinovirus infection   Acute respiratory failure with hypoxia (HCC)   COPD exacerbation / Rhinovirus infection  Respiratory viral panel was positive for rhinovirus.   Continue prednisone and bronchodilators.   Completed 3 days of azithromycin on 01/24/2023.   --Scheduled and PRN bronchodilators --Completed Prednisone, got 5 days steroids -- would resume steroids if recurrent wheezing --Stop Rocephin -- normal respiratory flora on sputum cx  --Continue to use flutter valve as needed.   --He is tolerating room air, not hypoxic - monitor closely. --Droplet  precautions. --Symptomatic/supportive care  Acute respirtatory failure with hypoxia -- resolved. Per EDP documentation, arrived hypoxic with sats in 80's on room air.  Now on room air. Mgmt as above.  Supplement o2 if needed to keep sats > 88%  Paroxysmal atrial fibrillation/atrial flutter with RVR: Heart rate was better, but today was 100's to 120 on monitor during rounds.  Afternoon today, HR 140's.   --Consult Cardiology --IV Cardizem 10 mg x 1 given --Continue oral Cardizem.   --Continue Eliquis. --Telemetry  Recent black stools: No acute overt bleeding at this time.   H&H is stable.  Resumed Eliquis.  Discontinue IV Protonix.   Monitor CBC   Chronic systolic CHF: Compensated.  2D echo in January 2024 showed EF estimated at 40 to 45%.  Type II DM with hyperglycemia: Continue insulin glargine and Farxiga.   Added NovoLog 4 units 3 times daily with meals  Constipation -- bowel regimen per orders.   Other comorbidities include hypertension, PVD, hyperlipidemia, schizoaffective disorder, history of hepatitis C, lumbar degenerative disc disease    Consultants: None  Procedures: None     Family Communication/Anticipated D/C date and plan/Code Status   DVT prophylaxis: SCDs Start: 01/21/23 0909 apixaban (ELIQUIS) tablet 5 mg     Code Status: Full Code  Family Communication: None Disposition Plan: Home when further improved, HR's controlled   Status is: Inpatient Remains inpatient appropriate because: COPD exacerbation, A-fib/flutter RVR with HR's uncontrolled.    Subjective:   Pt awake sitting up in bed this AM.  He reports ongoing dypsnea with any exertion.  HR's 100's to 120's while talking with me during encounter.  He denies palpitations.  Did have BM's after  additional stool softeners ordered yesterday, wants to continue Miralax.  Denies other acute complaints.   Objective:    Vitals:   01/25/23 0417 01/25/23 0529 01/25/23 0834 01/25/23 1153  BP:  (!)  141/88 (!) 149/78 135/89  Pulse:  72 69   Resp:  18 20 18   Temp:  98.3 F (36.8 C) 98 F (36.7 C) 97.7 F (36.5 C)  TempSrc:  Axillary  Oral  SpO2:  98% 94% 96%  Weight: 64.2 kg      No data found.   Intake/Output Summary (Last 24 hours) at 01/25/2023 1332 Last data filed at 01/25/2023 1157 Gross per 24 hour  Intake 720 ml  Output 3450 ml  Net -2730 ml   Filed Weights   01/23/23 0300 01/24/23 0537 01/25/23 0417  Weight: 60.2 kg 64.5 kg 64.2 kg    Exam:  General exam: awake, alert, no acute distress HEENT: moist mucus membranes, hearing grossly normal  Respiratory system: lungs clear but somewhat diminished throughout, normal respiratory effort at rest. On room air Cardiovascular system: irregularly irregular, no pedal edema.   Gastrointestinal system: soft, NT, ND Central nervous system: A&O x 3. no gross focal neurologic deficits, normal speech Extremities: moves all, no edema, normal tone Skin: dry, intact, normal temperature Psychiatry: normal mood, congruent affect     Data Reviewed:   Notable labs --- WBC improved 15.4 >> 11.7   Micro --  Sputum culture growing GCP's in pairs, GNR's - normal respiratory flora, no MRSA or pseudomonas. Final.    LOS: 4 days   Pennie Banter, DO  [facility]   Pager on [email or web address]. If 7PM-7AM, please contact night-coverage at [email or web address]     01/25/2023, 1:32 PM

## 2023-01-25 NOTE — Progress Notes (Signed)
EPIC Chatted provider at patient's request for PRN Miralax-states 1-2 packets work best for his constipation at home

## 2023-01-26 ENCOUNTER — Inpatient Hospital Stay (HOSPITAL_COMMUNITY)
Admit: 2023-01-26 | Discharge: 2023-01-26 | Disposition: A | Discharge status: Home / Self Care | Payer: PPO | Attending: Medical | Admitting: Medical

## 2023-01-26 DIAGNOSIS — I5021 Acute systolic (congestive) heart failure: Secondary | ICD-10-CM

## 2023-01-26 DIAGNOSIS — I4821 Permanent atrial fibrillation: Secondary | ICD-10-CM

## 2023-01-26 DIAGNOSIS — I5043 Acute on chronic combined systolic (congestive) and diastolic (congestive) heart failure: Secondary | ICD-10-CM | POA: Diagnosis not present

## 2023-01-26 DIAGNOSIS — J441 Chronic obstructive pulmonary disease with (acute) exacerbation: Secondary | ICD-10-CM | POA: Diagnosis not present

## 2023-01-26 DIAGNOSIS — I4892 Unspecified atrial flutter: Secondary | ICD-10-CM | POA: Insufficient documentation

## 2023-01-26 DIAGNOSIS — I5023 Acute on chronic systolic (congestive) heart failure: Secondary | ICD-10-CM

## 2023-01-26 DIAGNOSIS — I509 Heart failure, unspecified: Secondary | ICD-10-CM

## 2023-01-26 LAB — CBC
HCT: 46.7 % (ref 39.0–52.0)
Hemoglobin: 15.6 g/dL (ref 13.0–17.0)
MCH: 30.5 pg (ref 26.0–34.0)
MCHC: 33.4 g/dL (ref 30.0–36.0)
MCV: 91.2 fL (ref 80.0–100.0)
Platelets: 300 10*3/uL (ref 150–400)
RBC: 5.12 MIL/uL (ref 4.22–5.81)
RDW: 13.4 % (ref 11.5–15.5)
WBC: 12.6 10*3/uL — ABNORMAL HIGH (ref 4.0–10.5)
nRBC: 0 % (ref 0.0–0.2)

## 2023-01-26 LAB — GLUCOSE, CAPILLARY
Glucose-Capillary: 174 mg/dL — ABNORMAL HIGH (ref 70–99)
Glucose-Capillary: 167 mg/dL — ABNORMAL HIGH (ref 70–99)
Glucose-Capillary: 157 mg/dL — ABNORMAL HIGH (ref 70–99)
Glucose-Capillary: 234 mg/dL — ABNORMAL HIGH (ref 70–99)
Glucose-Capillary: 255 mg/dL — ABNORMAL HIGH (ref 70–99)

## 2023-01-26 LAB — ECHOCARDIOGRAM LIMITED
S' Lateral: 4.2 cm
Weight: 2268.09 oz

## 2023-01-26 LAB — BASIC METABOLIC PANEL
Anion gap: 9 (ref 5–15)
BUN: 32 mg/dL — ABNORMAL HIGH (ref 8–23)
CO2: 27 mmol/L (ref 22–32)
Calcium: 9.2 mg/dL (ref 8.9–10.3)
Chloride: 96 mmol/L — ABNORMAL LOW (ref 98–111)
Creatinine, Ser: 0.84 mg/dL (ref 0.61–1.24)
GFR, Estimated: >60 mL/min (ref >60)
Glucose, Bld: 216 mg/dL — ABNORMAL HIGH (ref 70–99)
Potassium: 5 mmol/L (ref 3.5–5.1)
Sodium: 132 mmol/L — ABNORMAL LOW (ref 135–145)

## 2023-01-26 LAB — MAGNESIUM: Magnesium: 2.4 mg/dL (ref 1.7–2.4)

## 2023-01-26 MED ORDER — IPRATROPIUM-ALBUTEROL 0.5-2.5 (3) MG/3ML IN SOLN: 3.0000 mL | RESPIRATORY_TRACT | Status: DC | PRN

## 2023-01-26 MED ORDER — METOPROLOL TARTRATE 25 MG PO TABS
25.0000 mg | ORAL_TABLET | Freq: Four times a day (QID) | ORAL | Status: DC
  Administered 2023-01-26 – 2023-01-27 (×3): 25 mg via ORAL
  Filled 2023-01-26 (×4): qty 1

## 2023-01-26 MED ORDER — PREDNISONE 20 MG PO TABS
40.0000 mg | ORAL_TABLET | Freq: Every day | ORAL | Status: DC
  Administered 2023-01-26 – 2023-01-27 (×2): 40 mg via ORAL
  Filled 2023-01-26 (×2): qty 2

## 2023-01-26 MED ORDER — HEPARIN (PORCINE) 25000 UT/250ML-% IV SOLN
1000.0000 [IU]/h | INTRAVENOUS | Status: DC
  Administered 2023-01-26 – 2023-01-27 (×2): 1100 [IU]/h via INTRAVENOUS
  Administered 2023-01-28: 1000 [IU]/h via INTRAVENOUS
  Filled 2023-01-26 (×3): qty 250

## 2023-01-26 MED ORDER — SODIUM CHLORIDE 0.9% FLUSH
3.0000 mL | Freq: Two times a day (BID) | INTRAVENOUS | Status: DC
  Administered 2023-01-26 – 2023-01-31 (×7): 3 mL via INTRAVENOUS

## 2023-01-26 NOTE — TOC Progression Note (Signed)
Transition of Care [facility]) - Progression Note    Patient Details  Name: Mark Nash MRN: [id number] Date of Birth: July 05, 1956  [facility]) CM/SW Contact  Truddie Hidden, RN Phone Number: 01/26/2023, 4:51 PM  Clinical Narrative:    Case reviewed for DME needs and changes in discharge disposition.         Expected Discharge Plan and Services                                               Social Determinants of Health (SDOH) Interventions SDOH Screenings   Food Insecurity: No Food Insecurity (01/21/2023)  Housing: Low Risk  (01/21/2023)  Transportation Needs: No Transportation Needs (01/21/2023)  Utilities: Not At Risk (01/21/2023)  Alcohol Screen: Low Risk  (09/29/2022)  Depression (PHQ2-9): Low Risk  (12/27/2022)  Financial Resource Strain: Low Risk  (09/29/2022)  Physical Activity: Insufficiently Active (09/29/2022)  Social Connections: Unknown (09/29/2022)  Stress: No Stress Concern Present (09/29/2022)  Tobacco Use: High Risk (01/22/2023)    Readmission Risk Interventions     No data to display

## 2023-01-26 NOTE — Progress Notes (Signed)
Progress Note    Mark Nash  ZOX:[id number] DOB: 25-Nov-1955  DOA: 01/21/2023 PCP: Alba Cory, MD      Brief Narrative:    Medical records reviewed and are as summarized below:  Mark Nash is a 67 y.o. male  with medical history significant of  hypertension,HLD, type 2 diabetes, PAD, COPD/asthma not O2 dependent, atrial fibrillation on chronic anticoagulation with apixaban, CHFef 40-45%, hepc C, schizoaffective/schizophrenia disorder, tobacco use, who has interim history of visit to ED on 5/18 with similar complaints of shortness of breath.  He was diagnosed with COPD exacerbation and discharge to home prednisone and doxycycline at that time.  He presented to the hospital again on 01/21/2023 with cough, congestion and shortness of breath.  He also reported some dark stools about 3 days prior to admission.   He was admitted to the hospital for COPD exacerbation.  He was treated with steroids, bronchodilators and empiric antibiotics.  Respiratory viral panel was positive for rhinovirus which probably triggered COPD exacerbation.    Assessment/Plan:   Principal Problem:   COPD exacerbation (HCC) Active Problems:   Paranoid schizophrenia (HCC)   Peripheral vascular disease (HCC)   Type 2 diabetes mellitus with diabetic polyneuropathy, with long-term current use of insulin (HCC)   Rhinovirus infection   Acute respiratory failure with hypoxia (HCC)   COPD exacerbation / Rhinovirus infection  Respiratory viral panel was positive for rhinovirus.   Continue prednisone and bronchodilators.   Completed 3 days of azithromycin on 01/24/2023.   --Scheduled and PRN bronchodilators --Completed Prednisone, got 5 days steroids -- will resume prednisone 40 mg and taper given ongoing wheezing --Stop Rocephin -- normal respiratory flora on sputum cx  --Continue to use flutter valve as needed.   --He is tolerating room air, not hypoxic - monitor closely. --Droplet  precautions. --Symptomatic/supportive care  Acute respirtatory failure with hypoxia -- resolved. Per EDP documentation, arrived hypoxic with sats in 80's on room air.  Now on room air. Mgmt as above.  Supplement o2 if needed to keep sats > 88%  Paroxysmal atrial fibrillation/atrial flutter with RVR: Heart rate was better, but today was 100's to 120 on monitor during rounds.  Afternoon today, HR 140's.  Initially on Cardizem drip --Consult Cardiology --Continue oral Cardizem. --Lopressor 12.5 mg Q6H added 5/30   --Continue Eliquis. --Telemetry  Recent black stools: No acute overt bleeding at this time.   H&H is stable.  Resumed Eliquis.  Discontinue IV Protonix.   Monitor CBC   Chronic systolic CHF: Compensated.  2D echo in January 2024 showed EF estimated at 40 to 45%.  Type II DM with hyperglycemia: Continue insulin glargine and Farxiga.   Added NovoLog 4 units 3 times daily with meals  Constipation -- bowel regimen per orders.   Other comorbidities include hypertension, PVD, hyperlipidemia, schizoaffective disorder, history of hepatitis C, lumbar degenerative disc disease    Consultants: None  Procedures: None     Family Communication/Anticipated D/C date and plan/Code Status   DVT prophylaxis: SCDs Start: 01/21/23 0909 apixaban (ELIQUIS) tablet 5 mg     Code Status: Full Code  Family Communication: None Disposition Plan: Home when further improved, HR's controlled   Status is: Inpatient Remains inpatient appropriate because: COPD exacerbation, A-fib/flutter RVR with HR's uncontrolled.    Subjective:   Pt was standing in his room, recently returned from ambulating. Feels a bit better today.  HR's better controlled but still elevated.  HR 100's to 110's with him standing  and talking.  He feels overall improved but breathing not at baseline yet.  Objective:    Vitals:   01/26/23 0500 01/26/23 0730 01/26/23 0738 01/26/23 1241  BP:   136/87 104/83  Pulse:     (!) 137  Resp:   16 18  Temp:   98.1 F (36.7 C) 98.5 F (36.9 C)  TempSrc:      SpO2:  98% 99% 92%  Weight: 64.3 kg      No data found.   Intake/Output Summary (Last 24 hours) at 01/26/2023 1318 Last data filed at 01/26/2023 0457 Gross per 24 hour  Intake 900.64 ml  Output 2850 ml  Net -1949.36 ml   Filed Weights   01/24/23 0537 01/25/23 0417 01/26/23 0500  Weight: 64.5 kg 64.2 kg 64.3 kg    Exam:  General exam: awake, alert, no acute distress HEENT: moist mucus membranes, hearing grossly normal  Respiratory system: expiratory wheezes, normal respiratory effort. On room air Cardiovascular system: irregularly irregular, no pedal edema.   Gastrointestinal system: soft, NT, ND Central nervous system: A&O x 3. no gross focal neurologic deficits, normal speech Extremities: moves all, no edema, normal tone Skin: dry, intact, normal temperature Psychiatry: normal mood, congruent affect     Data Reviewed:   Notable labs --- Na 132, Cl 96, glucose 216, BUN 32, WBC 12.6 from 11.7k   Micro --  Sputum culture growing GCP's in pairs, GNR's - normal respiratory flora, no MRSA or pseudomonas. Final.    LOS: 5 days   Pennie Banter, DO  [facility]   Pager on [email or web address]. If 7PM-7AM, please contact night-coverage at [email or web address]     01/26/2023, 1:18 PM

## 2023-01-26 NOTE — Consult Note (Signed)
ELECTROPHYSIOLOGY CONSULT NOTE    Patient ID: Mark Nash MRN: [id number], DOB/AGE: 1955/12/20 67 y.o.  Admit date: 01/21/2023 Date of Consult: 01/26/2023  Primary Physician: Alba Cory, MD Primary Cardiologist: Julien Nordmann, MD  Electrophysiologist: Dr. Lalla Brothers   Referring Provider: Dr. Denton Lank  Patient Profile: Mark Nash is a 67 y.o. male with a history of persistent afib/flutter (?perm), PAD, HTN, asthma, COPD who is being seen today for the evaluation of aflutter w RVR at the request of Dr. Denton Lank.  HPI:  Mark Nash is a 67 y.o. male with PMH as above. Initially admitted to [facility] 5/26 with worsening SOB thought to be COPD exacerbation. He was dx'd with rhinovirus and possible PNA. Throughout hospitalization, ventricular rates have been difficult to control. General cardiology was consulted yesterday.   He saw Dr. Lalla Brothers for afib mgmt about 1 year ago. S/p failed DCCV. Not ablation candidate.   Today, he tells me that his breathing is improving compared to when he first came in to hospital. He was able to walk around the unit today without gasping for air. Prior to this hospitalization, his COPD was well-controlled, could walk laps around mall without SOB.  When I first walked into his room, HR elevated to 130-140s while violently coughing up phlegm. Throughout our conversation, HR lowered to 110s.  He regularly takes his eliquis for stroke ppx, no missed doses.  Updated TTE pending read. WBC continues to be elevated   Labs Potassium5.0 (05/31 0545) Magnesium  2.4 (05/31 0545) Creatinine, ser  0.84 (05/31 0545) PLT  300 (05/31 0545) HGB  15.6 (05/31 0545) WBC 12.6* (05/31 0545)  .    Past Medical History:  Diagnosis Date   Arthritis    Asthma    Benign neoplasm of ascending colon    polyps   Constipation    COPD (chronic obstructive pulmonary disease) (HCC)    NO INHALER   Diabetes mellitus without complication (HCC)    Type 2    Diverticulosis    Dyspnea    RARE-WITH EXERTION DUE TO COPD   GERD (gastroesophageal reflux disease)    Hepatitis C    History of kidney stones    H/O   Hyperlipidemia    Hypertension 06/10/2018   currently not under control   Left inguinal hernia    Peripheral vascular disease (HCC)    Restless legs    Schizoaffective disorder (HCC)    Pt denies   Thrombocytopenia (HCC)    Vitamin D deficiency    Wears dentures    full upper and lower     Surgical History:  Past Surgical History:  Procedure Laterality Date   BACK SURGERY     CARDIOVERSION N/A 05/20/2021   Procedure: CARDIOVERSION;  Surgeon: Iran Ouch, MD;  Location: [facility] ORS;  Service: Cardiovascular;  Laterality: N/A;   CATARACT EXTRACTION W/PHACO Right 10/31/2021   Procedure: CATARACT EXTRACTION PHACO AND INTRAOCULAR LENS PLACEMENT (IOC) RIGHT DIABETIC;  Surgeon: Nevada Crane, MD;  Location: [facility];  Service: Ophthalmology;  Laterality: Right;  Diabetic 4.34 00:42.8   COLONOSCOPY WITH PROPOFOL N/A 09/07/2017   Procedure: COLONOSCOPY WITH PROPOFOL;  Surgeon: Pasty Spillers, MD;  Location: [facility];  Service: Endoscopy;  Laterality: N/A;   ESOPHAGOGASTRODUODENOSCOPY (EGD) WITH PROPOFOL N/A 12/07/2017   Procedure: ESOPHAGOGASTRODUODENOSCOPY (EGD) WITH PROPOFOL;  Surgeon: Pasty Spillers, MD;  Location: [facility];  Service: Endoscopy;  Laterality: N/A;   EYE SURGERY     FLEXIBLE SIGMOIDOSCOPY N/A 11/09/2017  Procedure: FLEXIBLE SIGMOIDOSCOPY;  Surgeon: Pasty Spillers, MD;  Location: [facility];  Service: Endoscopy;  Laterality: N/A;   HERNIA REPAIR     inguinal   INGUINAL HERNIA REPAIR Bilateral 06/21/2018   Procedure: LAPAROSCOPIC BILATERAL INGUINAL HERNIA REPAIR;  Surgeon: Ancil Linsey, MD;  Location: [facility];  Service: General;  Laterality: Bilateral;   L3 TO L5 LAMINECTOMY FOR DECOMPRESSION  07/17/2016   [facility]   REPAIR OF  CEREBROSPINAL FLUID LEAK N/A 09/08/2016   Procedure: Lumbar wound exploration, repair of pseudomenigocele;  Surgeon: Loura Halt Ditty, MD;  Location: [facility];  Service: Neurosurgery;  Laterality: N/A;  Lumbar wound exploration, repair of pseudomenigocele   SINUSOTOMY       Medications Prior to Admission  Medication Sig Dispense Refill Last Dose   apixaban (ELIQUIS) 5 MG TABS tablet Take 1 tablet by mouth twice daily 60 tablet 5 01/20/2023 at 2100   Budeson-Glycopyrrol-Formoterol (BREZTRI AEROSPHERE) 160-9-4.8 MCG/ACT AERO Inhale 2 puffs into the lungs 2 (two) times daily. Patient receives via [employer] Patient Assistance. 32.1 g 3 01/20/2023 at 2100   dapagliflozin propanediol (FARXIGA) 10 MG TABS tablet Take 1 tablet (10 mg total) by mouth daily. 90 tablet 3 01/20/2023 at 0900   diltiazem (DILT-XR) 240 MG 24 hr capsule Take 1 capsule (240 mg total) by mouth daily. 90 capsule 1 01/20/2023 at 0900   gabapentin (NEURONTIN) 300 MG capsule Take 300-600 mg by mouth See admin instructions. Take 1 capsule (300 mg) by mouth in the morning, take 1 capsule (300 mg) by mouth at noon, and 2 capsules (600 mg) by mouth in the evening   01/20/2023   LEVEMIR 100 UNIT/ML injection INJECT 8 UNITS SUBCUTANEOUSLY AT BEDTIME (Patient taking differently: Inject 8 Units into the skin at bedtime.) 10 mL 0 01/20/2023 at 2100   potassium chloride (KLOR-CON) 10 MEQ tablet Take 1 tablet (10 mEq total) by mouth every Monday, Wednesday, and Friday. 36 tablet 0 Past Month at UNKNOWN   pravastatin (PRAVACHOL) 20 MG tablet Take 1 tablet (20 mg total) by mouth daily with supper. 90 tablet 3 01/20/2023 at 2100   tiZANidine (ZANAFLEX) 4 MG tablet Take 4 mg by mouth every 12 (twelve) hours.   Past Week   traMADol (ULTRAM) 50 MG tablet Take 50 mg by mouth every 12 (twelve) hours.   01/20/2023 at 2100   Vitamin D, Ergocalciferol, (DRISDOL) 1.25 MG (50000 UNIT) CAPS capsule TAKE 1 CAPSULE BY MOUTH EVERY TWO WEEKS 6 capsule 0 Past Month   blood  glucose meter kit and supplies KIT Dispense based on patient and insurance preference. Use up to tid E11.65, LON:99 1 each 0    guaiFENesin (MUCINEX) 600 MG 12 hr tablet Take 600 mg by mouth 2 (two) times daily as needed for to loosen phlegm.   PRN at PRN   Lancet Devices MISC 1 each by Does not apply route 3 (three) times daily as needed. What ins will cover dx:E11.65, LON:99 tid 100 each 12    losartan (COZAAR) 25 MG tablet Take 0.5 tablets (12.5 mg total) by mouth daily. 45 tablet 0    sulfamethoxazole-trimethoprim (BACTRIM DS) 800-160 MG tablet Take 1 tablet by mouth 2 (two) times daily. (Patient not taking: Reported on 01/21/2023) 10 tablet 0 Completed Course    Inpatient Medications:   apixaban  5 mg Oral BID   dapagliflozin propanediol  10 mg Oral Daily   diltiazem  240 mg Oral Daily   fluticasone furoate-vilanterol  1 puff  Inhalation Daily   And   umeclidinium bromide  1 puff Inhalation Daily   gabapentin  300 mg Oral q AM   And   gabapentin  300 mg Oral Q1200   And   gabapentin  600 mg Oral QHS   insulin aspart  0-9 Units Subcutaneous TID WC   insulin aspart  4 Units Subcutaneous TID WC   insulin detemir  8 Units Subcutaneous QHS   ipratropium-albuterol  3 mL Nebulization BID   metoprolol tartrate  12.5 mg Oral Q6H   polyethylene glycol  34 g Oral Daily   pravastatin  20 mg Oral QHS   predniSONE  40 mg Oral Q breakfast   traMADol  50 mg Oral Q12H    Allergies:  Allergies  Allergen Reactions   Penicillins Hives and Swelling    As a child. Has patient had a PCN reaction causing immediate rash, facial/tongue/throat swelling, SOB or lightheadedness with hypotension: YES  Has patient had a PCN reaction causing severe rash involving mucus membranes or skin necrosis: UNKNOWN Has patient had a PCN reaction that required hospitalization: UNKNOWN Has patient had a PCN reaction occurring within the last 10 years: NO   Aspirin Nausea Only   Ibuprofen Nausea Only   Lyrica  [Pregabalin] Nausea Only   Acetaminophen Nausea Only    Family History  Problem Relation Age of Onset   Heart failure Mother    Hypertension Mother    Diverticulitis Mother    Colitis Mother    Diabetes Father    Hypertension Father    Heart disease Father    Diabetes Sister    Heart failure Brother    Diverticulitis Brother      Physical Exam: Vitals:   01/26/23 0454 01/26/23 0500 01/26/23 0730 01/26/23 0738  BP: (!) 152/77   136/87  Pulse: 91     Resp: 20   16  Temp: 98.2 F (36.8 C)   98.1 F (36.7 C)  TempSrc: Axillary     SpO2: 95%  98% 99%  Weight:  64.3 kg      GEN- NAD, A&O x 3, normal affect, thin HEENT: Normocephalic, atraumatic Lungs- clear, diminished throughout, Normal effort.  Heart- irregular, tachycardic rate and rhythm, No M/G/R.  GI- Soft, NT, ND.  Extremities- No clubbing, cyanosis, or edema   Radiology/Studies: DG Chest Port 1 View  Result Date: 01/21/2023 CLINICAL DATA:  Shortness of breath.  Cough. EXAM: PORTABLE CHEST 1 VIEW COMPARISON:  01/13/2023 FINDINGS: The lungs are clear without focal pneumonia, edema, pneumothorax or pleural effusion. Interstitial markings are diffusely coarsened with chronic features. The cardiopericardial silhouette is within normal limits for size. Old posterior right rib fracture evident. Telemetry leads overlie the chest. IMPRESSION: Chronic interstitial coarsening without acute cardiopulmonary findings. Electronically Signed   By: Kennith Center M.D.   On: 01/21/2023 06:10   DG Chest 2 View  Result Date: 01/13/2023 CLINICAL DATA:  Shortness of breath. EXAM: CHEST - 2 VIEW COMPARISON:  Chest radiograph dated 04/19/2021. FINDINGS: Background of emphysema. No focal consolidation, pleural effusion, or pneumothorax. The cardiac silhouette is within normal limits. No acute osseous pathology. IMPRESSION: No active cardiopulmonary disease. Electronically Signed   By: Elgie Collard M.D.   On: 01/13/2023 03:48   NM Myocar  Multi W/Spect W/Wall Motion / EF  Result Date: 01/02/2023   Sensitivity and specificity of the study are significant reduced due to artifact.  The resting images are uninterpretable due to severe extracardiac activity.   Abnormal, probably  low risk pharmacologic myocardial.   There is a small in size, mild in severity, fixed apical anterior and apical defect most consistent with artifact but cannot rule out an element of scar.   There is no evidence of significant ischemia.   Left ventricular systolic function is moderately reduced (LVEF 35-45%).   Coronary artery calcification and aortic atherosclerosis are present.   No significant change from prior study on 05/12/2021 allowing for differences in artifact.    EKG: typical counterclockwise flutter, rate 118bpm  (personally reviewed)  TELEMETRY: aflutter with labile ventricular rates as high as 140s, usually 120-130s range (personally reviewed)   Assessment/Plan:  #) Perm Aflutter/afib #) aflutter w RVR #) HFmrEF ( concern for HFrEF) LVEF appears significantly lower than previous, pending final read Stop diltiazem - consider ischemic eval if LVEF is significantly reduced HR elevated with coughing, but decreased to 110s  Continue metop at 12.5 q6 for now Consider increasing metop dose if HR remains consistently > 130 while not coughing WBC count continues to be elevated AAD options limited by comorbidities CHA2DS2-VASc Score = / > 4 (HTN, T2DM, vasc dz, age)  Cont eliquis 5mg  BID for stroke ppx     #) PNA #) COPD exacerbation - mgmt per primary team   For questions or updates, please contact [facility] Please consult [email or web address] for contact info under Cardiology/STEMI.  Signed, Sherie Don, NP  01/26/2023 12:39 PM

## 2023-01-26 NOTE — Consult Note (Signed)
ANTICOAGULATION CONSULT NOTE - Initial Consult  Pharmacy Consult for heparin infusion Indication: atrial fibrillation  Allergies  Allergen Reactions   Penicillins Hives and Swelling    As a child. Has patient had a PCN reaction causing immediate rash, facial/tongue/throat swelling, SOB or lightheadedness with hypotension: YES  Has patient had a PCN reaction causing severe rash involving mucus membranes or skin necrosis: UNKNOWN Has patient had a PCN reaction that required hospitalization: UNKNOWN Has patient had a PCN reaction occurring within the last 10 years: NO   Aspirin Nausea Only   Ibuprofen Nausea Only   Lyrica [Pregabalin] Nausea Only   Acetaminophen Nausea Only    Patient Measurements: Weight: 64.3 kg (141 lb 12.1 oz) Heparin Dosing Weight: 64.3 kg  Vital Signs: Temp: 98.5 F (36.9 C) (05/31 1241) Temp Source: Axillary (05/31 0454) BP: 104/83 (05/31 1241) Pulse Rate: 137 (05/31 1241)  Labs: Recent Labs    01/24/23 0521 01/25/23 0604 01/26/23 0545  HGB 14.8 15.5 15.6  HCT 44.2 46.1 46.7  PLT 247 272 300  CREATININE 0.83  --  0.84    Estimated Creatinine Clearance: 77.6 mL/min (by C-G formula based on SCr of 0.84 mg/dL).   Medical History: Past Medical History:  Diagnosis Date   Arthritis    Asthma    Benign neoplasm of ascending colon    polyps   Constipation    COPD (chronic obstructive pulmonary disease) (HCC)    NO INHALER   Diabetes mellitus without complication (HCC)    Type 2   Diverticulosis    Dyspnea    RARE-WITH EXERTION DUE TO COPD   GERD (gastroesophageal reflux disease)    Hepatitis C    History of kidney stones    H/O   Hyperlipidemia    Hypertension 06/10/2018   currently not under control   Left inguinal hernia    Peripheral vascular disease (HCC)    Restless legs    Schizoaffective disorder (HCC)    Pt denies   Thrombocytopenia (HCC)    Vitamin D deficiency    Wears dentures    full upper and lower    Medications:   Apixaban 5 mg BID - last dose 5/31 @ 0952  Assessment: 67 y.o. male with a history of persistent afib/flutter s/p faild DCCV, PAD, CHF on apixaban, evaluated for Afib w/ RVR General cardiology team is planning for left and right heart catheterization on Monday 01/29/23. Pharmacy has been consulted to initiate and manage IV heparin therapy.     Goal of Therapy:  Heparin level 0.3-0.7 units/ml aPTT 66-102 seconds Monitor platelets by anticoagulation protocol: Yes   Plan:  Start heparin infusion at 1100 units/hr tonight @ 2200 Check aPTT level in 6 hours given recent apixaban exposure Continue to monitor H&H and platelets  Sharen Hones, PharmD, BCPS Clinical Pharmacist   01/26/2023,4:44 PM

## 2023-01-26 NOTE — Progress Notes (Signed)
Rounding Note    Patient Name: Mark Nash Date of Encounter: 01/26/2023  [facility] Cardiologist: Julien Nordmann, MD   Subjective   HE remains in Afib/flutter with labile rates. He reports breathing is better. No chest pain.   Inpatient Medications    Scheduled Meds:  apixaban  5 mg Oral BID   dapagliflozin propanediol  10 mg Oral Daily   diltiazem  240 mg Oral Daily   fluticasone furoate-vilanterol  1 puff Inhalation Daily   And   umeclidinium bromide  1 puff Inhalation Daily   gabapentin  300 mg Oral q AM   And   gabapentin  300 mg Oral Q1200   And   gabapentin  600 mg Oral QHS   insulin aspart  0-9 Units Subcutaneous TID WC   insulin aspart  4 Units Subcutaneous TID WC   insulin detemir  8 Units Subcutaneous QHS   ipratropium-albuterol  3 mL Nebulization BID   metoprolol tartrate  12.5 mg Oral Q6H   polyethylene glycol  34 g Oral Daily   pravastatin  20 mg Oral QHS   predniSONE  40 mg Oral Q breakfast   traMADol  50 mg Oral Q12H   Continuous Infusions:  PRN Meds: bisacodyl, bisacodyl, guaiFENesin, levalbuterol, magnesium citrate, ondansetron **OR** ondansetron (ZOFRAN) IV   Vital Signs    Vitals:   01/26/23 0454 01/26/23 0500 01/26/23 0730 01/26/23 0738  BP: (!) 152/77   136/87  Pulse: 91     Resp: 20   16  Temp: 98.2 F (36.8 C)   98.1 F (36.7 C)  TempSrc: Axillary     SpO2: 95%  98% 99%  Weight:  64.3 kg      Intake/Output Summary (Last 24 hours) at 01/26/2023 1052 Last data filed at 01/26/2023 0457 Gross per 24 hour  Intake 900.64 ml  Output 3050 ml  Net -2149.36 ml      01/26/2023    5:00 AM 01/25/2023    4:17 AM 01/24/2023    5:37 AM  Last 3 Weights  Weight (lbs) 141 lb 12.1 oz 141 lb 8.6 oz 142 lb 3.2 oz  Weight (kg) 64.3 kg 64.2 kg 64.5 kg      Telemetry    Afib/flutter with labile rates, 801-140s, around 110 during interview - Personally Reviewed  ECG    No new - Personally Reviewed  Physical Exam   GEN: No  acute distress.   Neck: No JVD Cardiac: Irreg Irreg, tachy, no murmurs, rubs, or gallops.  Respiratory: diminished sounds, crackles. GI: Soft, nontender, non-distended  MS: No edema; No deformity. Neuro:  Nonfocal  Psych: Normal affect   Labs    High Sensitivity Troponin:   Recent Labs  Lab 01/13/23 0255 01/13/23 0431  TROPONINIHS 19* 24*     Chemistry Recent Labs  Lab 01/21/23 0645 01/22/23 0615 01/24/23 0521 01/26/23 0545  NA 137 133* 133* 132*  K 3.8 4.4 4.9 5.0  CL 101 101 99 96*  CO2 29 28 28 27   GLUCOSE 92 145* 174* 216*  BUN 21 21 32* 32*  CREATININE 0.74 0.75 0.83 0.84  CALCIUM 8.5* 8.5* 9.1 9.2  MG  --  2.6*  --  2.4  PROT 6.2* 6.2*  --   --   ALBUMIN 3.8 3.5  --   --   AST 28 20  --   --   ALT 31 27  --   --   ALKPHOS 95 87  --   --  BILITOT 1.3* 1.2  --   --   GFRNONAA >60 >60 >60 >60  ANIONGAP 7 4* 6 9    Lipids No results for input(s): "CHOL", "TRIG", "HDL", "LABVLDL", "LDLCALC", "CHOLHDL" in the last 168 hours.  Hematology Recent Labs  Lab 01/24/23 0521 01/25/23 0604 01/26/23 0545  WBC 15.4* 11.7* 12.6*  RBC 4.86 5.08 5.12  HGB 14.8 15.5 15.6  HCT 44.2 46.1 46.7  MCV 90.9 90.7 91.2  MCH 30.5 30.5 30.5  MCHC 33.5 33.6 33.4  RDW 13.3 13.4 13.4  PLT 247 272 300   Thyroid  Recent Labs  Lab 01/21/23 1220  TSH 0.564    BNP Recent Labs  Lab 01/21/23 0550  BNP 19.7    DDimer  Recent Labs  Lab 01/21/23 1720  DDIMER <0.27     Radiology    No results found.  Cardiac Studies   Myoview lexiscan 12/2022 Narrative & Impression      Sensitivity and specificity of the study are significant reduced due to artifact.  The resting images are uninterpretable due to severe extracardiac activity.   Abnormal, probably low risk pharmacologic myocardial.   There is a small in size, mild in severity, fixed apical anterior and apical defect most consistent with artifact but cannot rule out an element of scar.   There is no evidence of  significant ischemia.   Left ventricular systolic function is moderately reduced (LVEF 35-45%).   Coronary artery calcification and aortic atherosclerosis are present.   No significant change from prior study on 05/12/2021 allowing for differences in artifact.      Echo 08/2022  1. Left ventricular ejection fraction, by estimation, is 45 to 50%. The  left ventricle has mildly decreased function. The left ventricle  demonstrates global hypokinesis. The left ventricular internal cavity size  was mildly dilated. Left ventricular  diastolic parameters are indeterminate.   2. Right ventricular systolic function is normal. The right ventricular  size is not well visualized.   3. Left atrial size was mildly dilated.   4. Right atrial size was mildly dilated.   5. The mitral valve is grossly normal. No evidence of mitral valve  regurgitation.   6. The aortic valve is calcified. Aortic valve regurgitation is not  visualized.   7. The inferior vena cava is dilated in size with <50% respiratory  variability, suggesting right atrial pressure of 15 mmHg.   8. Rhythm strip during this exam demonstrates atrial flutter.   Comparison(s): 06/28/21 EF 55-60%.    Echo 06/2021 1. Left ventricular ejection fraction, by estimation, is 55 to 60%. The  left ventricle has normal function. The left ventricle has no regional  wall motion abnormalities. Left ventricular diastolic parameters are  indeterminate.   2. Right ventricular systolic function is normal. The right ventricular  size is normal.   3. Left atrial size was mildly dilated.   4. The mitral valve was not well visualized. No evidence of mitral valve  regurgitation. No evidence of mitral stenosis.   5. The aortic valve was not well visualized. Aortic valve regurgitation  is not visualized. Mild to moderate aortic valve sclerosis/calcification  is present, without any evidence of aortic stenosis.   6. There is dilatation of the ascending aorta,  measuring 37 mm. There is  dilatation of the aortic root, measuring 36 mm.   7. The inferior vena cava is normal in size with greater than 50%  respiratory variability, suggesting right atrial pressure of 3 mmHg.   Comparison(s):  EF 55%, technically challenging due to limited acoustic  windows.   Patient Profile     67 y.o. male with a hx of HTN, HLD, DM2, PAD, COPD/asthma not O2 dependent, persistent afib/flutter on Eliquis, HFmrEF, hep C, schizoaffective/schizophrenia disorder, tobacco use who is being seen 01/25/2023 for the evaluation of rapid afib/flutter.  Assessment & Plan    Rapid Afib/flutter H/o persistent Afib/flutter  - Rapid afib/flutter in the setting of acute respiratory failure with hypoxia, COPD exacerbation, Rhinovirus, possible PNA - h/o persistent Afib flutter with prior failed cardioversion. Seen by EP and not felt to be a good ablation candidate - PTA rate controlled on diltiazem 240mg  - Eliquis 5mg  BID for stroke ppx - CHADSVASC at least 4 (HTN, DM2, PAD, age) - he is on diltiazem 240mg  daily (Not the best with reduced EF) and he was started on metoprolol 12.5mg  QID with improvement of rates - Rates still labile 80-140s - expect rates will improve as pulmonary issues resolve - would likely benefit from seeing EP   HFmrEF - Echo 2022 with normal LVEF - Echo in 08/2022 showed LVEF 45-50% - Myoview lexi was overall nonischemic - patient appears euvolemic - re-check limited echo - PTA Losartan and Farxiga. ARB held on admission   HTN - Bps good   Acute respiratory failure Acute COPD exacerbation, Rhinovirus, ?PNA - abx per IM - he is off supplemental O2  For questions or updates, please contact [facility] Please consult [email or web address] for contact info under        Signed, Danessa Mensch David Stall, PA-C  01/26/2023, 10:52 AM

## 2023-01-26 NOTE — Progress Notes (Signed)
*  PRELIMINARY RESULTS* Echocardiogram A Limited 2D Echocardiogram has been performed.  Carolyne Fiscal 01/26/2023, 12:24 PM

## 2023-01-26 NOTE — Consult Note (Signed)
   [facility] Inpatient Consult   01/26/2023  Mark Nash [birth year] [id number]  Primary Care Provider:   Dr. Carlynn Purl [facility]  Patient is currently active with [facility] [[facility]] Care Management for chronic disease management services.  Patient has been engaged by a [facility].  Our community based plan of care has focused on disease management and community resource support. Attempted bedside visit however pt on the phone and unable to converse. RN will collaborate with the active [facility] RN care coordinator and update accordingly.  Patient will receive a post hospital call and will be evaluated for assessments and disease process education.   Inpatient Transition Of Care [TOC] team member to make aware that [facility] Care Management following.  Of note, [facility] Care Management services does not replace or interfere with any services that are needed or arranged by inpatient [facility] care management team.   For additional questions or referrals please contact:   Elliot Cousin, RN, BSN [facility] Liaison [place]   [facility]  Population Health Office Hours MTWF  8:00 am-6:00 pm Off on Thursday [phone] mobile [phone] [Office toll free line][facility] Office Hours are M-F 8:30 - 5 pm 24 hour nurse advise line [phone] Concierge  [email or web address]

## 2023-01-27 DIAGNOSIS — I5021 Acute systolic (congestive) heart failure: Secondary | ICD-10-CM | POA: Diagnosis not present

## 2023-01-27 DIAGNOSIS — I4892 Unspecified atrial flutter: Secondary | ICD-10-CM | POA: Diagnosis not present

## 2023-01-27 DIAGNOSIS — J441 Chronic obstructive pulmonary disease with (acute) exacerbation: Secondary | ICD-10-CM | POA: Diagnosis not present

## 2023-01-27 LAB — BASIC METABOLIC PANEL
Anion gap: 9 (ref 5–15)
Anion gap: 9 (ref 5–15)
BUN: 45 mg/dL — ABNORMAL HIGH (ref 8–23)
BUN: 48 mg/dL — ABNORMAL HIGH (ref 8–23)
CO2: 27 mmol/L (ref 22–32)
CO2: 27 mmol/L (ref 22–32)
Calcium: 9 mg/dL (ref 8.9–10.3)
Calcium: 8.8 mg/dL — ABNORMAL LOW (ref 8.9–10.3)
Chloride: 93 mmol/L — ABNORMAL LOW (ref 98–111)
Chloride: 96 mmol/L — ABNORMAL LOW (ref 98–111)
Creatinine, Ser: 0.91 mg/dL (ref 0.61–1.24)
Creatinine, Ser: 1 mg/dL (ref 0.61–1.24)
GFR, Estimated: >60 mL/min (ref >60)
GFR, Estimated: >60 mL/min (ref >60)
Glucose, Bld: 250 mg/dL — ABNORMAL HIGH (ref 70–99)
Glucose, Bld: 177 mg/dL — ABNORMAL HIGH (ref 70–99)
Potassium: 5 mmol/L (ref 3.5–5.1)
Potassium: 5.3 mmol/L — ABNORMAL HIGH (ref 3.5–5.1)
Sodium: 129 mmol/L — ABNORMAL LOW (ref 135–145)
Sodium: 132 mmol/L — ABNORMAL LOW (ref 135–145)

## 2023-01-27 LAB — GLUCOSE, CAPILLARY
Glucose-Capillary: 184 mg/dL — ABNORMAL HIGH (ref 70–99)
Glucose-Capillary: 153 mg/dL — ABNORMAL HIGH (ref 70–99)
Glucose-Capillary: 231 mg/dL — ABNORMAL HIGH (ref 70–99)
Glucose-Capillary: 162 mg/dL — ABNORMAL HIGH (ref 70–99)

## 2023-01-27 LAB — CBC
HCT: 45.8 % (ref 39.0–52.0)
Hemoglobin: 15.7 g/dL (ref 13.0–17.0)
MCH: 31 pg (ref 26.0–34.0)
MCHC: 34.3 g/dL (ref 30.0–36.0)
MCV: 90.5 fL (ref 80.0–100.0)
Platelets: 303 10*3/uL (ref 150–400)
RBC: 5.06 MIL/uL (ref 4.22–5.81)
RDW: 13.3 % (ref 11.5–15.5)
WBC: 11.8 10*3/uL — ABNORMAL HIGH (ref 4.0–10.5)
nRBC: 0 % (ref 0.0–0.2)

## 2023-01-27 LAB — SODIUM, URINE, RANDOM: Sodium, Ur: 20 mmol/L

## 2023-01-27 LAB — OSMOLALITY: Osmolality: 297 mOsm/kg — ABNORMAL HIGH (ref 275–295)

## 2023-01-27 LAB — TSH: TSH: 1.455 u[IU]/mL (ref 0.350–4.500)

## 2023-01-27 LAB — OSMOLALITY, URINE: Osmolality, Ur: 505 mOsm/kg (ref 300–900)

## 2023-01-27 LAB — APTT
aPTT: 81 seconds — ABNORMAL HIGH (ref 24–36)
aPTT: 83 seconds — ABNORMAL HIGH (ref 24–36)

## 2023-01-27 MED ORDER — SODIUM CHLORIDE 0.9 % IV SOLN: INTRAVENOUS | Status: AC

## 2023-01-27 MED ORDER — PREDNISONE 10 MG PO TABS
30.0000 mg | ORAL_TABLET | Freq: Every day | ORAL | Status: DC
  Administered 2023-01-28: 30 mg via ORAL
  Filled 2023-01-27: qty 3

## 2023-01-27 MED ORDER — METOPROLOL TARTRATE 50 MG PO TABS
50.0000 mg | ORAL_TABLET | Freq: Three times a day (TID) | ORAL | Status: DC
  Administered 2023-01-27 – 2023-01-28 (×3): 50 mg via ORAL
  Filled 2023-01-27 (×4): qty 1

## 2023-01-27 MED ORDER — INSULIN ASPART 100 UNIT/ML IJ SOLN
5.0000 [IU] | Freq: Three times a day (TID) | INTRAMUSCULAR | Status: DC
  Administered 2023-01-27 – 2023-01-28 (×2): 5 [IU] via SUBCUTANEOUS
  Filled 2023-01-27 (×2): qty 1

## 2023-01-27 NOTE — Consult Note (Signed)
ANTICOAGULATION CONSULT NOTE - Initial Consult  Pharmacy Consult for heparin infusion Indication: atrial fibrillation  Allergies  Allergen Reactions   Penicillins Hives and Swelling    As a child. Has patient had a PCN reaction causing immediate rash, facial/tongue/throat swelling, SOB or lightheadedness with hypotension: YES  Has patient had a PCN reaction causing severe rash involving mucus membranes or skin necrosis: UNKNOWN Has patient had a PCN reaction that required hospitalization: UNKNOWN Has patient had a PCN reaction occurring within the last 10 years: NO   Aspirin Nausea Only   Ibuprofen Nausea Only   Lyrica [Pregabalin] Nausea Only   Acetaminophen Nausea Only    Patient Measurements: Weight: 61.1 kg (134 lb 9.6 oz) Heparin Dosing Weight: 64.3 kg  Vital Signs: Temp: 97.9 F (36.6 C) (06/01 0337) Temp Source: Oral (06/01 0337) BP: 128/76 (06/01 0337) Pulse Rate: 90 (06/01 0337)  Labs: Recent Labs    01/25/23 0604 01/26/23 0545 01/27/23 0534  HGB 15.5 15.6 15.7  HCT 46.1 46.7 45.8  PLT 272 300 303  APTT  --   --  81*  CREATININE  --  0.84  --      Estimated Creatinine Clearance: 73.7 mL/min (by C-G formula based on SCr of 0.84 mg/dL).   Medical History: Past Medical History:  Diagnosis Date   Arthritis    Asthma    Benign neoplasm of ascending colon    polyps   Constipation    COPD (chronic obstructive pulmonary disease) (HCC)    NO INHALER   Diabetes mellitus without complication (HCC)    Type 2   Diverticulosis    Dyspnea    RARE-WITH EXERTION DUE TO COPD   GERD (gastroesophageal reflux disease)    Hepatitis C    History of kidney stones    H/O   Hyperlipidemia    Hypertension 06/10/2018   currently not under control   Left inguinal hernia    Peripheral vascular disease (HCC)    Restless legs    Schizoaffective disorder (HCC)    Pt denies   Thrombocytopenia (HCC)    Vitamin D deficiency    Wears dentures    full upper and lower     Medications:  Apixaban 5 mg BID - last dose 5/31 @ 0952  Assessment: 67 y.o. male with a history of persistent afib/flutter s/p faild DCCV, PAD, CHF on apixaban, evaluated for Afib w/ RVR General cardiology team is planning for left and right heart catheterization on Monday 01/29/23. Pharmacy has been consulted to initiate and manage IV heparin therapy.     Goal of Therapy:  Heparin level 0.3-0.7 units/ml aPTT 66-102 seconds Monitor platelets by anticoagulation protocol: Yes   06/01 0534 aPTT 81, therapeutic x 1  Plan:  Continue heparin infusion at 1100 units/hr Check aPTT level in 6 hours to confirm HL daily until correlation with aPTT confirmed Continue to monitor H&H and platelets  Otelia Sergeant, PharmD, [facility] 01/27/2023 6:34 AM

## 2023-01-27 NOTE — Progress Notes (Signed)
Rounding Note    Patient Name: Mark Nash Date of Encounter: 01/27/2023  [facility] Cardiologist: Julien Nordmann, MD   Subjective   Afib/flutter rates elevated at times, but mostly controlled. Patient denies chest pain. Feels breathing is better, but not at baseline. Echo showed further reduced LVEF 30-35%.  Inpatient Medications    Scheduled Meds:  dapagliflozin propanediol  10 mg Oral Daily   fluticasone furoate-vilanterol  1 puff Inhalation Daily   And   umeclidinium bromide  1 puff Inhalation Daily   gabapentin  300 mg Oral q AM   And   gabapentin  300 mg Oral Q1200   And   gabapentin  600 mg Oral QHS   insulin aspart  0-9 Units Subcutaneous TID WC   insulin aspart  4 Units Subcutaneous TID WC   insulin detemir  8 Units Subcutaneous QHS   metoprolol tartrate  25 mg Oral Q6H   polyethylene glycol  34 g Oral Daily   pravastatin  20 mg Oral QHS   predniSONE  40 mg Oral Q breakfast   sodium chloride flush  3 mL Intravenous Q12H   traMADol  50 mg Oral Q12H   Continuous Infusions:  heparin 1,100 Units/hr (01/26/23 2140)   PRN Meds: bisacodyl, bisacodyl, guaiFENesin, ipratropium-albuterol, levalbuterol, magnesium citrate, ondansetron **OR** ondansetron (ZOFRAN) IV   Vital Signs    Vitals:   01/26/23 2323 01/27/23 0337 01/27/23 0727 01/27/23 0741  BP: 136/73 128/76  (!) 127/93  Pulse:  90 99   Resp: 20 18 18    Temp: 97.6 F (36.4 C) 97.9 F (36.6 C) 98.1 F (36.7 C)   TempSrc: Oral Oral Oral   SpO2: 95% 95%    Weight:  61.1 kg      Intake/Output Summary (Last 24 hours) at 01/27/2023 1042 Last data filed at 01/27/2023 0857 Gross per 24 hour  Intake 3 ml  Output 3115 ml  Net -3112 ml      01/27/2023    3:37 AM 01/26/2023    5:00 AM 01/25/2023    4:17 AM  Last 3 Weights  Weight (lbs) 134 lb 9.6 oz 141 lb 12.1 oz 141 lb 8.6 oz  Weight (kg) 61.054 kg 64.3 kg 64.2 kg      Telemetry    Afib Hr 80-90s - Personally Reviewed  ECG    No new -  Personally Reviewed  Physical Exam   GEN: No acute distress.   Neck: No JVD Cardiac: Irreg Irreg, no murmurs, rubs, or gallops.  Respiratory: diffusely diminished, crackle at bases. GI: Soft, nontender, non-distended  MS: No edema; No deformity. Neuro:  Nonfocal  Psych: Normal affect   Labs    High Sensitivity Troponin:   Recent Labs  Lab 01/13/23 0255 01/13/23 0431  TROPONINIHS 19* 24*     Chemistry Recent Labs  Lab 01/21/23 0645 01/22/23 0615 01/24/23 0521 01/26/23 0545 01/27/23 0534  NA 137 133* 133* 132* 129*  K 3.8 4.4 4.9 5.0 5.0  CL 101 101 99 96* 93*  CO2 29 28 28 27 27   GLUCOSE 92 145* 174* 216* 250*  BUN 21 21 32* 32* 45*  CREATININE 0.74 0.75 0.83 0.84 0.91  CALCIUM 8.5* 8.5* 9.1 9.2 9.0  MG  --  2.6*  --  2.4  --   PROT 6.2* 6.2*  --   --   --   ALBUMIN 3.8 3.5  --   --   --   AST 28 20  --   --   --  ALT 31 27  --   --   --   ALKPHOS 95 87  --   --   --   BILITOT 1.3* 1.2  --   --   --   GFRNONAA >60 >60 >60 >60 >60  ANIONGAP 7 4* 6 9 9     Lipids No results for input(s): "CHOL", "TRIG", "HDL", "LABVLDL", "LDLCALC", "CHOLHDL" in the last 168 hours.  Hematology Recent Labs  Lab 01/25/23 0604 01/26/23 0545 01/27/23 0534  WBC 11.7* 12.6* 11.8*  RBC 5.08 5.12 5.06  HGB 15.5 15.6 15.7  HCT 46.1 46.7 45.8  MCV 90.7 91.2 90.5  MCH 30.5 30.5 31.0  MCHC 33.6 33.4 34.3  RDW 13.4 13.4 13.3  PLT 272 300 303   Thyroid  Recent Labs  Lab 01/21/23 1220  TSH 0.564    BNP Recent Labs  Lab 01/21/23 0550  BNP 19.7    DDimer  Recent Labs  Lab 01/21/23 1720  DDIMER <0.27     Radiology    ECHOCARDIOGRAM LIMITED  Result Date: 01/26/2023    ECHOCARDIOGRAM LIMITED REPORT   Patient Name:   Mark Nash Scheier Date of Exam: 01/26/2023 Medical Rec #:  [id number]       Height:       71.0 in Accession #:    [id number]      Weight:       141.8 lb Date of Birth:  December 06, 1955       BSA:          1.822 m Patient Age:    66 years        BP:           139/87  mmHg Patient Gender: M               HR:           109 bpm. Exam Location:  [facility] Procedure: Limited Echo Indications:     CHF  History:         Patient has prior history of Echocardiogram examinations, most                  recent 09/11/2022. CHF, COPD, Arrythmias:Atrial Fibrillation,                  Signs/Symptoms:Shortness of Breath; Risk Factors:Hypertension,                  Diabetes, Dyslipidemia and Current Smoker. Definity was not                  used due to poor windows.  Sonographer:     Mikki Harbor Referring Phys:  [id number] Kamee Bobst H Fransico Sciandra Diagnosing Phys: Lorine Bears MD  Sonographer Comments: Technically challenging study due to limited acoustic windows, no apical window and suboptimal parasternal window. IMPRESSIONS  1. Left ventricular ejection fraction, by estimation, is 30 to 35%. The left ventricle has moderately decreased function. Left ventricular endocardial border not optimally defined to evaluate regional wall motion.  2. Right ventricular systolic function is normal. The right ventricular size is normal.  3. Left atrial size was moderately dilated.  4. The mitral valve is normal in structure. No evidence of mitral valve regurgitation. No evidence of mitral stenosis.  5. The aortic valve is calcified. Aortic valve regurgitation is not visualized. No aortic stenosis is present.  6. Aortic dilatation noted. There is mild dilatation of the aortic root, measuring 41 mm.  7. Challenging image quality. Most valves not well  seen. FINDINGS  Left Ventricle: Left ventricular ejection fraction, by estimation, is 30 to 35%. The left ventricle has moderately decreased function. Left ventricular endocardial border not optimally defined to evaluate regional wall motion. The left ventricular internal cavity size was normal in size. There is no left ventricular hypertrophy. Right Ventricle: The right ventricular size is normal. No increase in right ventricular wall thickness. Right ventricular systolic  function is normal. Left Atrium: Left atrial size was moderately dilated. Right Atrium: Right atrial size was normal in size. Pericardium: There is no evidence of pericardial effusion. Mitral Valve: The mitral valve is normal in structure. There is mild thickening of the mitral valve leaflet(s). No evidence of mitral valve stenosis. Tricuspid Valve: The tricuspid valve is normal in structure. Tricuspid valve regurgitation is not demonstrated. No evidence of tricuspid stenosis. Aortic Valve: The aortic valve is calcified. Aortic valve regurgitation is not visualized. No aortic stenosis is present. Pulmonic Valve: The pulmonic valve was normal in structure. Pulmonic valve regurgitation is not visualized. No evidence of pulmonic stenosis. Aorta: Aortic dilatation noted. There is mild dilatation of the aortic root, measuring 41 mm. Venous: The inferior vena cava was not well visualized. IAS/Shunts: No atrial level shunt detected by color flow Doppler. LEFT VENTRICLE PLAX 2D LVIDd:         4.90 cm LVIDs:         4.20 cm LV PW:         1.00 cm LV IVS:        0.90 cm LVOT diam:     2.30 cm LVOT Area:     4.15 cm  LEFT ATRIUM         Index LA diam:    5.00 cm 2.74 cm/m   AORTA Ao Root diam: 4.10 cm  SHUNTS Systemic Diam: 2.30 cm Lorine Bears MD Electronically signed by Lorine Bears MD Signature Date/Time: 01/26/2023/3:04:26 PM    Final     Cardiac Studies    Myoview lexiscan 12/2022 Narrative & Impression      Sensitivity and specificity of the study are significant reduced due to artifact.  The resting images are uninterpretable due to severe extracardiac activity.   Abnormal, probably low risk pharmacologic myocardial.   There is a small in size, mild in severity, fixed apical anterior and apical defect most consistent with artifact but cannot rule out an element of scar.   There is no evidence of significant ischemia.   Left ventricular systolic function is moderately reduced (LVEF 35-45%).   Coronary  artery calcification and aortic atherosclerosis are present.   No significant change from prior study on 05/12/2021 allowing for differences in artifact.      Echo 08/2022  1. Left ventricular ejection fraction, by estimation, is 45 to 50%. The  left ventricle has mildly decreased function. The left ventricle  demonstrates global hypokinesis. The left ventricular internal cavity size  was mildly dilated. Left ventricular  diastolic parameters are indeterminate.   2. Right ventricular systolic function is normal. The right ventricular  size is not well visualized.   3. Left atrial size was mildly dilated.   4. Right atrial size was mildly dilated.   5. The mitral valve is grossly normal. No evidence of mitral valve  regurgitation.   6. The aortic valve is calcified. Aortic valve regurgitation is not  visualized.   7. The inferior vena cava is dilated in size with <50% respiratory  variability, suggesting right atrial pressure of 15 mmHg.   8.  Rhythm strip during this exam demonstrates atrial flutter.   Comparison(s): 06/28/21 EF 55-60%.    Echo 06/2021 1. Left ventricular ejection fraction, by estimation, is 55 to 60%. The  left ventricle has normal function. The left ventricle has no regional  wall motion abnormalities. Left ventricular diastolic parameters are  indeterminate.   2. Right ventricular systolic function is normal. The right ventricular  size is normal.   3. Left atrial size was mildly dilated.   4. The mitral valve was not well visualized. No evidence of mitral valve  regurgitation. No evidence of mitral stenosis.   5. The aortic valve was not well visualized. Aortic valve regurgitation  is not visualized. Mild to moderate aortic valve sclerosis/calcification  is present, without any evidence of aortic stenosis.   6. There is dilatation of the ascending aorta, measuring 37 mm. There is  dilatation of the aortic root, measuring 36 mm.   7. The inferior vena cava is  normal in size with greater than 50%  respiratory variability, suggesting right atrial pressure of 3 mmHg.   Comparison(s): EF 55%, technically challenging due to limited acoustic  windows.   Patient Profile     67 y.o. male  with a hx of HTN, HLD, DM2, PAD, COPD/asthma not O2 dependent, persistent afib/flutter on Eliquis, HFmrEF, hep C, schizoaffective/schizophrenia disorder, tobacco use who is being seen 01/25/2023 for the evaluation of rapid afib/flutter.   Assessment & Plan    Rapid Afib/flutter H/o persistent Afib/flutter  - Rapid afib/flutter in the setting of acute respiratory failure with hypoxia, COPD exacerbation, Rhinovirus, possible PNA - h/o persistent Afib flutter with prior failed cardioversion. Seen by EP and not felt to be a good ablation candidate - PTA rate controlled on diltiazem 240mg   - started on metoprolol 25mg  QID - echo showed reduced EF and dilt was held - Eliquis 5mg  BID for stroke ppx>held for IV heparin - CHADSVASC at least 4 (HTN, DM2, PAD, age) - Rates still labile 80-140s - rates improving as pulmonary issues resolve - EP saw and agree with rate control for now   HFrEF - Echo 2022 with normal LVEF - Echo in 08/2022 showed LVEF 45-50% - echo this admission showed newly reduced LVEF 30-35% - patient appears euvolemic - PTA Losartan and Farxiga. ARB held on admission - plan for R/L heart cath Monday Risks and benefits of cardiac catheterization have been discussed with the patient.  These include bleeding, infection, kidney damage, stroke, heart attack, death.  The patient understands these risks and is willing to proceed.    HTN - Bps good   Acute respiratory failure Acute COPD exacerbation, Rhinovirus, ?PNA - abx per IM - breathing is improving  For questions or updates, please contact [facility] Please consult [email or web address] for contact info under        Signed, Salinda Snedeker David Stall, PA-C  01/27/2023, 10:42 AM

## 2023-01-27 NOTE — Consult Note (Signed)
ANTICOAGULATION CONSULT NOTE -   Pharmacy Consult for heparin infusion Indication: atrial fibrillation  Allergies  Allergen Reactions   Penicillins Hives and Swelling    As a child. Has patient had a PCN reaction causing immediate rash, facial/tongue/throat swelling, SOB or lightheadedness with hypotension: YES  Has patient had a PCN reaction causing severe rash involving mucus membranes or skin necrosis: UNKNOWN Has patient had a PCN reaction that required hospitalization: UNKNOWN Has patient had a PCN reaction occurring within the last 10 years: NO   Aspirin Nausea Only   Ibuprofen Nausea Only   Lyrica [Pregabalin] Nausea Only   Acetaminophen Nausea Only    Patient Measurements: Weight: 61.1 kg (134 lb 9.6 oz) Heparin Dosing Weight: 64.3 kg  Vital Signs: Temp: 97.8 F (36.6 C) (06/01 1205) Temp Source: Oral (06/01 0727) BP: 141/85 (06/01 1205) Pulse Rate: 103 (06/01 1205)  Labs: Recent Labs    01/25/23 0604 01/26/23 0545 01/27/23 0534 01/27/23 1239  HGB 15.5 15.6 15.7  --   HCT 46.1 46.7 45.8  --   PLT 272 300 303  --   APTT  --   --  81* 83*  CREATININE  --  0.84 0.91  --      Estimated Creatinine Clearance: 68.1 mL/min (by C-G formula based on SCr of 0.91 mg/dL).   Medical History: Past Medical History:  Diagnosis Date   Arthritis    Asthma    Benign neoplasm of ascending colon    polyps   Constipation    COPD (chronic obstructive pulmonary disease) (HCC)    NO INHALER   Diabetes mellitus without complication (HCC)    Type 2   Diverticulosis    Dyspnea    RARE-WITH EXERTION DUE TO COPD   GERD (gastroesophageal reflux disease)    Hepatitis C    History of kidney stones    H/O   Hyperlipidemia    Hypertension 06/10/2018   currently not under control   Left inguinal hernia    Peripheral vascular disease (HCC)    Restless legs    Schizoaffective disorder (HCC)    Pt denies   Thrombocytopenia (HCC)    Vitamin D deficiency    Wears dentures     full upper and lower    Medications:  Apixaban 5 mg BID - last dose 5/31 @ 0952  Assessment: 67 y.o. male with a history of persistent afib/flutter s/p faild DCCV, PAD, CHF on apixaban, evaluated for Afib w/ RVR General cardiology team is planning for left and right heart catheterization on Monday 01/29/23. Pharmacy has been consulted to initiate and manage IV heparin therapy.     Goal of Therapy:  Heparin level 0.3-0.7 units/ml aPTT 66-102 seconds Monitor platelets by anticoagulation protocol: Yes   06/01 0534 aPTT 81, therapeutic x 1 06/01 1239 aPTT 83,  therapeutic x1  cont at 1100 u/hr  Plan:  06/01 1239 aPTT 83,  therapeutic x1 Continue heparin infusion at 1100 units/hr Check next aPTT with am labs HL daily until correlation with aPTT confirmed, then change to follow HL Heart cath planned for Monday 6/3 Continue to monitor H&H and platelets  Bari Mantis PharmD Clinical Pharmacist 01/27/2023

## 2023-01-27 NOTE — Progress Notes (Signed)
Progress Note    [name]  [name]:[id number] DOB: [birth year]  DOA: 01/21/2023 PCP: Alba Cory, MD      Brief Narrative:    Medical records reviewed and are as summarized below:  Mark Nash is a 67 y.o. male  with medical history significant of  hypertension,HLD, type 2 diabetes, PAD, COPD/asthma not O2 dependent, atrial fibrillation on chronic anticoagulation with apixaban, CHFef 40-45%, hepc C, schizoaffective/schizophrenia disorder, tobacco use, who has interim history of visit to ED on 5/18 with similar complaints of shortness of breath.  He was diagnosed with COPD exacerbation and discharge to home prednisone and doxycycline at that time.  He presented to the hospital again on 01/21/2023 with cough, congestion and shortness of breath.  He also reported some dark stools about 3 days prior to admission.   He was admitted to the hospital for COPD exacerbation.  He was treated with steroids, bronchodilators and empiric antibiotics.  Respiratory viral panel was positive for rhinovirus which probably triggered COPD exacerbation.    Assessment/Plan:   Principal Problem:   COPD exacerbation (HCC) Active Problems:   Paranoid schizophrenia (HCC)   Peripheral vascular disease (HCC)   Type 2 diabetes mellitus with diabetic polyneuropathy, with long-term current use of insulin (HCC)   Rhinovirus infection   Acute respiratory failure with hypoxia (HCC)   Chronic atrial flutter (HCC)   Acute systolic heart failure (HCC)   COPD exacerbation / Rhinovirus infection  Respiratory viral panel was positive for rhinovirus.   Continue prednisone and bronchodilators.   Completed 3 days of azithromycin on 01/24/2023.   --Scheduled and PRN bronchodilators --Completed Prednisone, got 5 days steroids -- will resume prednisone 40 mg and taper given ongoing wheezing --Stop Rocephin -- normal respiratory flora on sputum cx  --Continue to use flutter valve as needed.   --He is  tolerating room air, not hypoxic - monitor closely. --Droplet precautions. --Symptomatic/supportive care  Acute respirtatory failure with hypoxia -- resolved. Per EDP documentation, arrived hypoxic with sats in 80's on room air.  Now on room air. Mgmt as above.  Supplement o2 if needed to keep sats > 88%  Paroxysmal atrial fibrillation/atrial flutter with RVR: Heart rate was better, but today was 100's to 120 on monitor during rounds.  Afternoon today, HR 140's.  Initially on Cardizem drip --Consult Cardiology --Planning for R/L heart cath on Monday  --Continue oral Cardizem. --Lopressor 12.5 mg Q6H added 5/30   --Hold Eliquis.  On heparin drip for procedure Monday. --Telemetry  Hyponatremia -- Na trended down, 129 this AM (6/1). --checked serum osm, urine osm and urine sodium, TSH --appears hypovolemic  --start NS @ 75 cc/hr --repeat BMP this evening and in AM --monitor volume status closely with reduced EF  Recent black stools: No acute overt bleeding at this time.   H&H is stable.  Resumed Eliquis.  Discontinue IV Protonix.   Monitor CBC   Chronic systolic CHF: Compensated.   2D echo in January 2024 showed EF estimated at 40 to 45%. Repeat echo this admission, EF 30-35% --Cardiology following for A-fib/flutter RVR --R/L cath planned Monday  Type II DM with hyperglycemia:  --Continue insulin glargine and Farxiga.   --Added NovoLog 4 units 3 times daily with meals,  --Increase Novolog to 5 units with some CBG's in 200's --Also on sliding scale  Constipation -- bowel regimen per orders.   Dyslipidemia -- on pravastatin  Other comorbidities include hypertension, PVD, hyperlipidemia, schizoaffective disorder, history of hepatitis C, lumbar degenerative  disc disease    Consultants: None  Procedures: None     Family Communication/Anticipated D/C date and plan/Code Status   DVT prophylaxis: SCDs Start: 01/21/23 0909     Code Status: Full Code  Family  Communication: None Disposition Plan: Home when further improved, HR's controlled   Status is: Inpatient Remains inpatient appropriate because: COPD exacerbation, A-fib/flutter RVR with ongoing evaluation per cardiology.    Subjective:   Pt was awake resting in bed this AM.  Reports further improvement in his breathing, walked laps around the unit this AM.  HR's are improved somewhat today, in 90s at rest on the monitor.  Objective:    Vitals:   01/27/23 0337 01/27/23 0727 01/27/23 0741 01/27/23 1205  BP: 128/76  (!) 127/93 (!) 141/85  Pulse: 90 99  (!) 103  Resp: 18 18  16   Temp: 97.9 F (36.6 C) 98.1 F (36.7 C)  97.8 F (36.6 C)  TempSrc: Oral Oral    SpO2: 95%   92%  Weight: 61.1 kg      No data found.   Intake/Output Summary (Last 24 hours) at 01/27/2023 1430 Last data filed at 01/27/2023 1230 Gross per 24 hour  Intake 3 ml  Output 3355 ml  Net -3352 ml   Filed Weights   01/25/23 0417 01/26/23 0500 01/27/23 0337  Weight: 64.2 kg 64.3 kg 61.1 kg    Exam:  General exam: awake, alert, no acute distress HEENT: moist mucus membranes, hearing grossly normal  Respiratory system: expiratory wheezes, normal respiratory effort. On room air Cardiovascular system: irregularly irregular, no pedal edema.   Gastrointestinal system: soft, NT, ND Central nervous system: A&O x 3. no gross focal neurologic deficits, normal speech Extremities: moves all, no edema, normal tone Skin: dry, intact, normal temperature Psychiatry: normal mood, congruent affect     Data Reviewed:   Notable labs --- Na 132 >> 129, Cl 93, glucose 250, BUN 45, WBC 11.8 TSH normal 1.455 Serum osmolality 197 elevated Urine osmolality 505, urine sodium 20   Micro --  Sputum culture growing GCP's in pairs, GNR's - normal respiratory flora, no MRSA or pseudomonas. Final.    LOS: 6 days   Pennie Banter, DO  [facility]   Pager on [email or web address]. If 7PM-7AM, please contact  night-coverage at [email or web address]     01/27/2023, 2:30 PM

## 2023-01-28 DIAGNOSIS — J441 Chronic obstructive pulmonary disease with (acute) exacerbation: Secondary | ICD-10-CM | POA: Diagnosis not present

## 2023-01-28 DIAGNOSIS — I5043 Acute on chronic combined systolic (congestive) and diastolic (congestive) heart failure: Secondary | ICD-10-CM | POA: Diagnosis not present

## 2023-01-28 DIAGNOSIS — I4892 Unspecified atrial flutter: Secondary | ICD-10-CM | POA: Diagnosis not present

## 2023-01-28 DIAGNOSIS — L8990 Pressure ulcer of unspecified site, unspecified stage: Secondary | ICD-10-CM | POA: Insufficient documentation

## 2023-01-28 DIAGNOSIS — I5042 Chronic combined systolic (congestive) and diastolic (congestive) heart failure: Secondary | ICD-10-CM

## 2023-01-28 LAB — CBC
HCT: 41.6 % (ref 39.0–52.0)
Hemoglobin: 14 g/dL (ref 13.0–17.0)
MCH: 30.6 pg (ref 26.0–34.0)
MCHC: 33.7 g/dL (ref 30.0–36.0)
MCV: 91 fL (ref 80.0–100.0)
Platelets: 248 10*3/uL (ref 150–400)
RBC: 4.57 MIL/uL (ref 4.22–5.81)
RDW: 13.6 % (ref 11.5–15.5)
WBC: 12.1 10*3/uL — ABNORMAL HIGH (ref 4.0–10.5)
nRBC: 0 % (ref 0.0–0.2)

## 2023-01-28 LAB — BASIC METABOLIC PANEL
Anion gap: 6 (ref 5–15)
BUN: 36 mg/dL — ABNORMAL HIGH (ref 8–23)
CO2: 28 mmol/L (ref 22–32)
Calcium: 8.4 mg/dL — ABNORMAL LOW (ref 8.9–10.3)
Chloride: 99 mmol/L (ref 98–111)
Creatinine, Ser: 0.82 mg/dL (ref 0.61–1.24)
GFR, Estimated: >60 mL/min (ref >60)
Glucose, Bld: 111 mg/dL — ABNORMAL HIGH (ref 70–99)
Potassium: 4.6 mmol/L (ref 3.5–5.1)
Sodium: 133 mmol/L — ABNORMAL LOW (ref 135–145)

## 2023-01-28 LAB — GLUCOSE, CAPILLARY
Glucose-Capillary: 60 mg/dL — ABNORMAL LOW (ref 70–99)
Glucose-Capillary: 165 mg/dL — ABNORMAL HIGH (ref 70–99)
Glucose-Capillary: 237 mg/dL — ABNORMAL HIGH (ref 70–99)
Glucose-Capillary: 170 mg/dL — ABNORMAL HIGH (ref 70–99)

## 2023-01-28 LAB — APTT
aPTT: 114 seconds — ABNORMAL HIGH (ref 24–36)
aPTT: 88 seconds — ABNORMAL HIGH (ref 24–36)
aPTT: 83 seconds — ABNORMAL HIGH (ref 24–36)

## 2023-01-28 LAB — HEPARIN LEVEL (UNFRACTIONATED): Heparin Unfractionated: 1.01 IU/mL — ABNORMAL HIGH (ref 0.30–0.70)

## 2023-01-28 MED ORDER — SODIUM CHLORIDE 0.9% FLUSH: 3.0000 mL | INTRAVENOUS | Status: DC | PRN

## 2023-01-28 MED ORDER — ASPIRIN 81 MG PO CHEW
81.0000 mg | CHEWABLE_TABLET | ORAL | Status: AC
  Administered 2023-01-29: 81 mg via ORAL
  Filled 2023-01-28: qty 1

## 2023-01-28 MED ORDER — METOPROLOL TARTRATE 50 MG PO TABS
100.0000 mg | ORAL_TABLET | Freq: Two times a day (BID) | ORAL | Status: AC
  Administered 2023-01-28: 100 mg via ORAL
  Filled 2023-01-28: qty 2

## 2023-01-28 MED ORDER — SODIUM CHLORIDE 0.9 % WEIGHT BASED INFUSION
1.0000 mL/kg/h | INTRAVENOUS | Status: DC
  Administered 2023-01-29 (×2): 1 mL/kg/h via INTRAVENOUS

## 2023-01-28 MED ORDER — SODIUM CHLORIDE 0.9 % IV SOLN: 250.0000 mL | INTRAVENOUS | Status: DC | PRN

## 2023-01-28 MED ORDER — SODIUM CHLORIDE 0.9 % WEIGHT BASED INFUSION
3.0000 mL/kg/h | INTRAVENOUS | Status: DC
  Administered 2023-01-29: 3 mL/kg/h via INTRAVENOUS

## 2023-01-28 MED ORDER — INSULIN ASPART 100 UNIT/ML IJ SOLN
4.0000 [IU] | Freq: Three times a day (TID) | INTRAMUSCULAR | Status: DC
  Administered 2023-01-28 – 2023-01-31 (×8): 4 [IU] via SUBCUTANEOUS
  Filled 2023-01-28 (×8): qty 1

## 2023-01-28 MED ORDER — METOPROLOL SUCCINATE ER 100 MG PO TB24
200.0000 mg | ORAL_TABLET | Freq: Every day | ORAL | Status: DC
  Administered 2023-01-29 – 2023-01-31 (×3): 200 mg via ORAL
  Filled 2023-01-28 (×3): qty 2

## 2023-01-28 NOTE — Consult Note (Signed)
ANTICOAGULATION CONSULT NOTE -   Pharmacy Consult for heparin infusion Indication: atrial fibrillation  Allergies  Allergen Reactions   Penicillins Hives and Swelling    As a child. Has patient had a PCN reaction causing immediate rash, facial/tongue/throat swelling, SOB or lightheadedness with hypotension: YES  Has patient had a PCN reaction causing severe rash involving mucus membranes or skin necrosis: UNKNOWN Has patient had a PCN reaction that required hospitalization: UNKNOWN Has patient had a PCN reaction occurring within the last 10 years: NO   Aspirin Nausea Only   Ibuprofen Nausea Only   Lyrica [Pregabalin] Nausea Only   Acetaminophen Nausea Only    Patient Measurements: Weight: 61.2 kg (135 lb) Heparin Dosing Weight: 64.3 kg  Vital Signs: Temp: 97.4 F (36.3 C) (06/02 1202) Temp Source: Oral (06/02 1100) BP: 133/86 (06/02 1202) Pulse Rate: 67 (06/02 1202)  Labs: Recent Labs    01/26/23 0545 01/26/23 0545 01/27/23 0534 01/27/23 1239 01/27/23 1906 01/28/23 0520 01/28/23 1237  HGB 15.6  --  15.7  --   --  14.0  --   HCT 46.7  --  45.8  --   --  41.6  --   PLT 300  --  303  --   --  248  --   APTT  --    < > 81* 83*  --  114* 88*  HEPARINUNFRC  --   --   --   --   --  1.01*  --   CREATININE 0.84  --  0.91  --  1.00 0.82  --    < > = values in this interval not displayed.     Estimated Creatinine Clearance: 75.7 mL/min (by C-G formula based on SCr of 0.82 mg/dL).   Medical History: Past Medical History:  Diagnosis Date   Arthritis    Asthma    Benign neoplasm of ascending colon    polyps   Constipation    COPD (chronic obstructive pulmonary disease) (HCC)    NO INHALER   Diabetes mellitus without complication (HCC)    Type 2   Diverticulosis    Dyspnea    RARE-WITH EXERTION DUE TO COPD   GERD (gastroesophageal reflux disease)    Hepatitis C    History of kidney stones    H/O   Hyperlipidemia    Hypertension 06/10/2018   currently not  under control   Left inguinal hernia    Peripheral vascular disease (HCC)    Restless legs    Schizoaffective disorder (HCC)    Pt denies   Thrombocytopenia (HCC)    Vitamin D deficiency    Wears dentures    full upper and lower    Medications:  Apixaban 5 mg BID - last dose 5/31 @ 0952  Assessment: 67 y.o. male with a history of persistent afib/flutter s/p faild DCCV, PAD, CHF on apixaban, evaluated for Afib w/ RVR General cardiology team is planning for left and right heart catheterization on Monday 01/29/23. Pharmacy has been consulted to initiate and manage IV heparin therapy.     Goal of Therapy:  Heparin level 0.3-0.7 units/ml aPTT 66-102 seconds Monitor platelets by anticoagulation protocol: Yes   06/01 0534 aPTT 81, therapeutic x 1 06/01 1239 aPTT 83,  therapeutic x1  cont at 1100 u/hr 06/02 0520 aPTT 114, supratherapeutic / HL 1.01  06/02 1237 aPTT 88, Therapeutic x1  Plan:  continue heparin infusion at 1000 units/hr Check confirmatory aPTT in 6 hrs  HL daily until  correlation with aPTT confirmed, then change to follow HL Heart cath planned for Monday 6/3 Continue to monitor H&H and platelets  Bari Mantis PharmD Clinical Pharmacist 01/28/2023

## 2023-01-28 NOTE — Progress Notes (Signed)
Rounding Note    Patient Name: Mark Nash Date of Encounter: 01/28/2023  [facility] Cardiologist: Julien Nordmann, MD   Subjective   Afib/flutter rates 80-100. Patient reports breathing is much bette, but still a little short. No chest pain. Plan for cath tomorrow.   Inpatient Medications    Scheduled Meds:  dapagliflozin propanediol  10 mg Oral Daily   fluticasone furoate-vilanterol  1 puff Inhalation Daily   And   umeclidinium bromide  1 puff Inhalation Daily   gabapentin  300 mg Oral q AM   And   gabapentin  300 mg Oral Q1200   And   gabapentin  600 mg Oral QHS   insulin aspart  0-9 Units Subcutaneous TID WC   insulin aspart  5 Units Subcutaneous TID WC   insulin detemir  8 Units Subcutaneous QHS   metoprolol tartrate  50 mg Oral TID   polyethylene glycol  34 g Oral Daily   pravastatin  20 mg Oral QHS   predniSONE  30 mg Oral Q breakfast   sodium chloride flush  3 mL Intravenous Q12H   traMADol  50 mg Oral Q12H   Continuous Infusions:  sodium chloride 75 mL/hr at 01/28/23 0725   heparin 1,000 Units/hr (01/28/23 0734)   PRN Meds: bisacodyl, bisacodyl, guaiFENesin, ipratropium-albuterol, levalbuterol, magnesium citrate, ondansetron **OR** ondansetron (ZOFRAN) IV   Vital Signs    Vitals:   01/27/23 2055 01/27/23 2135 01/27/23 2316 01/28/23 0427  BP: 130/78 128/84 123/87 (!) 143/75  Pulse: 70 92 91 90  Resp: 18 (!) 25 20 (!) 21  Temp: 97.7 F (36.5 C)  98 F (36.7 C) 97.9 F (36.6 C)  TempSrc: Oral  Oral Oral  SpO2: 98%  97% 95%  Weight:    61.2 kg    Intake/Output Summary (Last 24 hours) at 01/28/2023 0739 Last data filed at 01/28/2023 0725 Gross per 24 hour  Intake 1734.12 ml  Output 2355 ml  Net -620.88 ml      01/28/2023    4:27 AM 01/27/2023    3:37 AM 01/26/2023    5:00 AM  Last 3 Weights  Weight (lbs) 135 lb 134 lb 9.6 oz 141 lb 12.1 oz  Weight (kg) 61.236 kg 61.054 kg 64.3 kg      Telemetry    Afib/flutter HR 80-100s -  Personally Reviewed  ECG    No new - Personally Reviewed  Physical Exam   GEN: No acute distress.   Neck: No JVD Cardiac: RRR, no murmurs, rubs, or gallops.  Respiratory: Clear to auscultation bilaterally. GI: Soft, nontender, non-distended  MS: No edema; No deformity. Neuro:  Nonfocal  Psych: Normal affect   Labs    High Sensitivity Troponin:   Recent Labs  Lab 01/13/23 0255 01/13/23 0431  TROPONINIHS 19* 24*     Chemistry Recent Labs  Lab 01/22/23 0615 01/24/23 0521 01/26/23 0545 01/27/23 0534 01/27/23 1906 01/28/23 0520  NA 133*   < > 132* 129* 132* 133*  K 4.4   < > 5.0 5.0 5.3* 4.6  CL 101   < > 96* 93* 96* 99  CO2 28   < > 27 27 27 28   GLUCOSE 145*   < > 216* 250* 177* 111*  BUN 21   < > 32* 45* 48* 36*  CREATININE 0.75   < > 0.84 0.91 1.00 0.82  CALCIUM 8.5*   < > 9.2 9.0 8.8* 8.4*  MG 2.6*  --  2.4  --   --   --  PROT 6.2*  --   --   --   --   --   ALBUMIN 3.5  --   --   --   --   --   AST 20  --   --   --   --   --   ALT 27  --   --   --   --   --   ALKPHOS 87  --   --   --   --   --   BILITOT 1.2  --   --   --   --   --   GFRNONAA >60   < > >60 >60 >60 >60  ANIONGAP 4*   < > 9 9 9 6    < > = values in this interval not displayed.    Lipids No results for input(s): "CHOL", "TRIG", "HDL", "LABVLDL", "LDLCALC", "CHOLHDL" in the last 168 hours.  Hematology Recent Labs  Lab 01/26/23 0545 01/27/23 0534 01/28/23 0520  WBC 12.6* 11.8* 12.1*  RBC 5.12 5.06 4.57  HGB 15.6 15.7 14.0  HCT 46.7 45.8 41.6  MCV 91.2 90.5 91.0  MCH 30.5 31.0 30.6  MCHC 33.4 34.3 33.7  RDW 13.4 13.3 13.6  PLT 300 303 248   Thyroid  Recent Labs  Lab 01/27/23 1239  TSH 1.455    BNPNo results for input(s): "BNP", "PROBNP" in the last 168 hours.  DDimer  Recent Labs  Lab 01/21/23 1720  DDIMER <0.27     Radiology    ECHOCARDIOGRAM LIMITED  Result Date: 01/26/2023    ECHOCARDIOGRAM LIMITED REPORT   Patient Name:   APOLO SHARER Dziuba Date of Exam: 01/26/2023  Medical Rec #:  [id number]       Height:       71.0 in Accession #:    [id number]      Weight:       141.8 lb Date of Birth:  11/26/1955       BSA:          1.822 m Patient Age:    66 years        BP:           139/87 mmHg Patient Gender: M               HR:           109 bpm. Exam Location:  [facility] Procedure: Limited Echo Indications:     CHF  History:         Patient has prior history of Echocardiogram examinations, most                  recent 09/11/2022. CHF, COPD, Arrythmias:Atrial Fibrillation,                  Signs/Symptoms:Shortness of Breath; Risk Factors:Hypertension,                  Diabetes, Dyslipidemia and Current Smoker. Definity was not                  used due to poor windows.  Sonographer:     Mikki Harbor Referring Phys:  [id number] Nathon Stefanski H Maddax Palinkas Diagnosing Phys: Lorine Bears MD  Sonographer Comments: Technically challenging study due to limited acoustic windows, no apical window and suboptimal parasternal window. IMPRESSIONS  1. Left ventricular ejection fraction, by estimation, is 30 to 35%. The left ventricle has moderately decreased function. Left ventricular endocardial border not optimally defined to evaluate regional  wall motion.  2. Right ventricular systolic function is normal. The right ventricular size is normal.  3. Left atrial size was moderately dilated.  4. The mitral valve is normal in structure. No evidence of mitral valve regurgitation. No evidence of mitral stenosis.  5. The aortic valve is calcified. Aortic valve regurgitation is not visualized. No aortic stenosis is present.  6. Aortic dilatation noted. There is mild dilatation of the aortic root, measuring 41 mm.  7. Challenging image quality. Most valves not well seen. FINDINGS  Left Ventricle: Left ventricular ejection fraction, by estimation, is 30 to 35%. The left ventricle has moderately decreased function. Left ventricular endocardial border not optimally defined to evaluate regional wall motion. The left  ventricular internal cavity size was normal in size. There is no left ventricular hypertrophy. Right Ventricle: The right ventricular size is normal. No increase in right ventricular wall thickness. Right ventricular systolic function is normal. Left Atrium: Left atrial size was moderately dilated. Right Atrium: Right atrial size was normal in size. Pericardium: There is no evidence of pericardial effusion. Mitral Valve: The mitral valve is normal in structure. There is mild thickening of the mitral valve leaflet(s). No evidence of mitral valve stenosis. Tricuspid Valve: The tricuspid valve is normal in structure. Tricuspid valve regurgitation is not demonstrated. No evidence of tricuspid stenosis. Aortic Valve: The aortic valve is calcified. Aortic valve regurgitation is not visualized. No aortic stenosis is present. Pulmonic Valve: The pulmonic valve was normal in structure. Pulmonic valve regurgitation is not visualized. No evidence of pulmonic stenosis. Aorta: Aortic dilatation noted. There is mild dilatation of the aortic root, measuring 41 mm. Venous: The inferior vena cava was not well visualized. IAS/Shunts: No atrial level shunt detected by color flow Doppler. LEFT VENTRICLE PLAX 2D LVIDd:         4.90 cm LVIDs:         4.20 cm LV PW:         1.00 cm LV IVS:        0.90 cm LVOT diam:     2.30 cm LVOT Area:     4.15 cm  LEFT ATRIUM         Index LA diam:    5.00 cm 2.74 cm/m   AORTA Ao Root diam: 4.10 cm  SHUNTS Systemic Diam: 2.30 cm Lorine Bears MD Electronically signed by Lorine Bears MD Signature Date/Time: 01/26/2023/3:04:26 PM    Final     Cardiac Studies    Myoview lexiscan 12/2022 Narrative & Impression      Sensitivity and specificity of the study are significant reduced due to artifact.  The resting images are uninterpretable due to severe extracardiac activity.   Abnormal, probably low risk pharmacologic myocardial.   There is a small in size, mild in severity, fixed apical anterior  and apical defect most consistent with artifact but cannot rule out an element of scar.   There is no evidence of significant ischemia.   Left ventricular systolic function is moderately reduced (LVEF 35-45%).   Coronary artery calcification and aortic atherosclerosis are present.   No significant change from prior study on 05/12/2021 allowing for differences in artifact.      Echo 08/2022  1. Left ventricular ejection fraction, by estimation, is 45 to 50%. The  left ventricle has mildly decreased function. The left ventricle  demonstrates global hypokinesis. The left ventricular internal cavity size  was mildly dilated. Left ventricular  diastolic parameters are indeterminate.   2. Right ventricular systolic  function is normal. The right ventricular  size is not well visualized.   3. Left atrial size was mildly dilated.   4. Right atrial size was mildly dilated.   5. The mitral valve is grossly normal. No evidence of mitral valve  regurgitation.   6. The aortic valve is calcified. Aortic valve regurgitation is not  visualized.   7. The inferior vena cava is dilated in size with <50% respiratory  variability, suggesting right atrial pressure of 15 mmHg.   8. Rhythm strip during this exam demonstrates atrial flutter.   Comparison(s): 06/28/21 EF 55-60%.    Echo 06/2021 1. Left ventricular ejection fraction, by estimation, is 55 to 60%. The  left ventricle has normal function. The left ventricle has no regional  wall motion abnormalities. Left ventricular diastolic parameters are  indeterminate.   2. Right ventricular systolic function is normal. The right ventricular  size is normal.   3. Left atrial size was mildly dilated.   4. The mitral valve was not well visualized. No evidence of mitral valve  regurgitation. No evidence of mitral stenosis.   5. The aortic valve was not well visualized. Aortic valve regurgitation  is not visualized. Mild to moderate aortic valve  sclerosis/calcification  is present, without any evidence of aortic stenosis.   6. There is dilatation of the ascending aorta, measuring 37 mm. There is  dilatation of the aortic root, measuring 36 mm.   7. The inferior vena cava is normal in size with greater than 50%  respiratory variability, suggesting right atrial pressure of 3 mmHg.   Comparison(s): EF 55%, technically challenging due to limited acoustic  windows.   Patient Profile     67 y.o. male with a hx of HTN, HLD, DM2, PAD, COPD/asthma not O2 dependent, persistent afib/flutter on Eliquis, HFmrEF, hep C, schizoaffective/schizophrenia disorder, tobacco use who is being seen 01/25/2023 for the evaluation of rapid afib/flutter and newly worsening LVEF  Assessment & Plan    Rapid Afib/flutter H/o persistent Afib/flutter  - Rapid afib/flutter in the setting of acute respiratory failure with hypoxia, COPD exacerbation, Rhinovirus, possible PNA - h/o persistent Afib flutter with prior failed cardioversion. Seen by EP and not felt to be a good ablation candidate - PTA rate controlled on diltiazem 240mg   - rate control with metoprolol 50mg  TID - Eliquis 5mg  BID for stroke ppx>held for IV heparin - CHADSVASC at least 4 (HTN, DM2, PAD, age) - Rates 80-100 - EP saw and agree with rate control for now   HFrEF - Echo 2022 with normal LVEF - Echo in 08/2022 showed LVEF 45-50% - echo this admission showed newly reduced LVEF 30-35% - patient appears euvolemic - PTA Losartan and Farxiga. held on admission - plan for R/L heart cath Monday Risks and benefits of cardiac catheterization have been discussed with the patient.  These include bleeding, infection, kidney damage, stroke, heart attack, death.  The patient understands these risks and is willing to proceed.    HTN - Bps good   Acute respiratory failure Acute COPD exacerbation, Rhinovirus, ?PNA - treatment per IM  For questions or updates, please contact [facility] Please consult [email or web address] for contact info under        Signed, Mayuri Staples David Stall, PA-C  01/28/2023, 7:39 AM

## 2023-01-28 NOTE — Plan of Care (Signed)
  Problem: Education: Goal: Knowledge of disease or condition will improve Outcome: Progressing Goal: Knowledge of the prescribed therapeutic regimen will improve Outcome: Progressing Goal: Individualized Educational Video(s) Outcome: Progressing   Problem: Activity: Goal: Ability to tolerate increased activity will improve Outcome: Progressing Goal: Will verbalize the importance of balancing activity with adequate rest periods Outcome: Progressing   Problem: Respiratory: Goal: Ability to maintain a clear airway will improve Outcome: Progressing Goal: Levels of oxygenation will improve Outcome: Progressing Goal: Ability to maintain adequate ventilation will improve Outcome: Progressing   Problem: Education: Goal: Ability to describe self-care measures that may prevent or decrease complications (Diabetes Survival Skills Education) will improve Outcome: Progressing Goal: Individualized Educational Video(s) Outcome: Progressing   Problem: Coping: Goal: Ability to adjust to condition or change in health will improve Outcome: Progressing   Problem: Fluid Volume: Goal: Ability to maintain a balanced intake and output will improve Outcome: Progressing   Problem: Health Behavior/Discharge Planning: Goal: Ability to identify and utilize available resources and services will improve Outcome: Progressing Goal: Ability to manage health-related needs will improve Outcome: Progressing   Problem: Metabolic: Goal: Ability to maintain appropriate glucose levels will improve Outcome: Progressing   Problem: Nutritional: Goal: Maintenance of adequate nutrition will improve Outcome: Progressing Goal: Progress toward achieving an optimal weight will improve Outcome: Progressing   Problem: Skin Integrity: Goal: Risk for impaired skin integrity will decrease Outcome: Progressing   Problem: Tissue Perfusion: Goal: Adequacy of tissue perfusion will improve Outcome: Progressing    Problem: Education: Goal: Knowledge of General Education information will improve Description: Including pain rating scale, medication(s)/side effects and non-pharmacologic comfort measures Outcome: Progressing   Problem: Health Behavior/Discharge Planning: Goal: Ability to manage health-related needs will improve Outcome: Progressing   Problem: Clinical Measurements: Goal: Ability to maintain clinical measurements within normal limits will improve Outcome: Progressing Goal: Will remain free from infection Outcome: Progressing Goal: Diagnostic test results will improve Outcome: Progressing Goal: Respiratory complications will improve Outcome: Progressing Goal: Cardiovascular complication will be avoided Outcome: Progressing   Problem: Activity: Goal: Risk for activity intolerance will decrease Outcome: Progressing   Problem: Nutrition: Goal: Adequate nutrition will be maintained Outcome: Progressing   Problem: Coping: Goal: Level of anxiety will decrease Outcome: Progressing   Problem: Elimination: Goal: Will not experience complications related to bowel motility Outcome: Progressing Goal: Will not experience complications related to urinary retention Outcome: Progressing   Problem: Pain Managment: Goal: General experience of comfort will improve Outcome: Progressing   Problem: Safety: Goal: Ability to remain free from injury will improve Outcome: Progressing   Problem: Skin Integrity: Goal: Risk for impaired skin integrity will decrease Outcome: Progressing   Problem: Education: Goal: Understanding of CV disease, CV risk reduction, and recovery process will improve Outcome: Progressing Goal: Individualized Educational Video(s) Outcome: Progressing   Problem: Activity: Goal: Ability to return to baseline activity level will improve Outcome: Progressing   Problem: Cardiovascular: Goal: Ability to achieve and maintain adequate cardiovascular perfusion  will improve Outcome: Progressing Goal: Vascular access site(s) Level 0-1 will be maintained Outcome: Progressing   Problem: Health Behavior/Discharge Planning: Goal: Ability to safely manage health-related needs after discharge will improve Outcome: Progressing

## 2023-01-28 NOTE — Consult Note (Signed)
ANTICOAGULATION CONSULT NOTE -   Pharmacy Consult for heparin infusion Indication: atrial fibrillation  Allergies  Allergen Reactions   Penicillins Hives and Swelling    As a child. Has patient had a PCN reaction causing immediate rash, facial/tongue/throat swelling, SOB or lightheadedness with hypotension: YES  Has patient had a PCN reaction causing severe rash involving mucus membranes or skin necrosis: UNKNOWN Has patient had a PCN reaction that required hospitalization: UNKNOWN Has patient had a PCN reaction occurring within the last 10 years: NO   Aspirin Nausea Only   Ibuprofen Nausea Only   Lyrica [Pregabalin] Nausea Only   Acetaminophen Nausea Only    Patient Measurements: Weight: 61.2 kg (135 lb) Heparin Dosing Weight: 64.3 kg  Vital Signs: Temp: 97.6 F (36.4 C) (06/02 1629) Temp Source: Oral (06/02 1629) BP: 121/94 (06/02 1629) Pulse Rate: 101 (06/02 1629)  Labs: Recent Labs    01/26/23 0545 01/27/23 0534 01/27/23 1239 01/27/23 1906 01/28/23 0520 01/28/23 1237 01/28/23 1750  HGB 15.6 15.7  --   --  14.0  --   --   HCT 46.7 45.8  --   --  41.6  --   --   PLT 300 303  --   --  248  --   --   APTT  --  81*   < >  --  114* 88* 83*  HEPARINUNFRC  --   --   --   --  1.01*  --   --   CREATININE 0.84 0.91  --  1.00 0.82  --   --    < > = values in this interval not displayed.     Estimated Creatinine Clearance: 75.7 mL/min (by C-G formula based on SCr of 0.82 mg/dL).   Medical History: Past Medical History:  Diagnosis Date   Arthritis    Asthma    Benign neoplasm of ascending colon    polyps   Constipation    COPD (chronic obstructive pulmonary disease) (HCC)    NO INHALER   Diabetes mellitus without complication (HCC)    Type 2   Diverticulosis    Dyspnea    RARE-WITH EXERTION DUE TO COPD   GERD (gastroesophageal reflux disease)    Hepatitis C    History of kidney stones    H/O   Hyperlipidemia    Hypertension 06/10/2018   currently not  under control   Left inguinal hernia    Peripheral vascular disease (HCC)    Restless legs    Schizoaffective disorder (HCC)    Pt denies   Thrombocytopenia (HCC)    Vitamin D deficiency    Wears dentures    full upper and lower    Medications:  Apixaban 5 mg BID - last dose 5/31 @ 0952  Assessment: 67 y.o. male with a history of persistent afib/flutter s/p faild DCCV, PAD, CHF on apixaban, evaluated for Afib w/ RVR General cardiology team is planning for left and right heart catheterization on Monday 01/29/23. Pharmacy has been consulted to initiate and manage IV heparin therapy.     Goal of Therapy:  Heparin level 0.3-0.7 units/ml aPTT 66-102 seconds Monitor platelets by anticoagulation protocol: Yes   06/01 0534 aPTT 81, therapeutic x 1 06/01 1239 aPTT 83,  therapeutic x1  cont at 1100 u/hr 06/02 0520 aPTT 114, supratherapeutic / HL 1.01  06/02 1237 aPTT 88, Therapeutic x1 06/02   1757 aPTT 83, therapeutic x 2  Plan:  continue heparin infusion at 1000 units/hr Check  aPTT with AM labs HL daily until correlation with aPTT confirmed, then change to follow HL Heart cath planned for Monday 6/3 Continue to monitor H&H and platelets  Clovia Cuff, PharmD, BCPS 01/28/2023 7:05 PM

## 2023-01-28 NOTE — Consult Note (Signed)
ANTICOAGULATION CONSULT NOTE -   Pharmacy Consult for heparin infusion Indication: atrial fibrillation  Allergies  Allergen Reactions   Penicillins Hives and Swelling    As a child. Has patient had a PCN reaction causing immediate rash, facial/tongue/throat swelling, SOB or lightheadedness with hypotension: YES  Has patient had a PCN reaction causing severe rash involving mucus membranes or skin necrosis: UNKNOWN Has patient had a PCN reaction that required hospitalization: UNKNOWN Has patient had a PCN reaction occurring within the last 10 years: NO   Aspirin Nausea Only   Ibuprofen Nausea Only   Lyrica [Pregabalin] Nausea Only   Acetaminophen Nausea Only    Patient Measurements: Weight: 61.2 kg (135 lb) Heparin Dosing Weight: 64.3 kg  Vital Signs: Temp: 97.9 F (36.6 C) (06/02 0427) Temp Source: Oral (06/02 0427) BP: 143/75 (06/02 0427) Pulse Rate: 90 (06/02 0427)  Labs: Recent Labs    01/26/23 0545 01/27/23 0534 01/27/23 1239 01/27/23 1906 01/28/23 0520  HGB 15.6 15.7  --   --  14.0  HCT 46.7 45.8  --   --  41.6  PLT 300 303  --   --  248  APTT  --  81* 83*  --  114*  HEPARINUNFRC  --   --   --   --  1.01*  CREATININE 0.84 0.91  --  1.00 0.82     Estimated Creatinine Clearance: 75.7 mL/min (by C-G formula based on SCr of 0.82 mg/dL).   Medical History: Past Medical History:  Diagnosis Date   Arthritis    Asthma    Benign neoplasm of ascending colon    polyps   Constipation    COPD (chronic obstructive pulmonary disease) (HCC)    NO INHALER   Diabetes mellitus without complication (HCC)    Type 2   Diverticulosis    Dyspnea    RARE-WITH EXERTION DUE TO COPD   GERD (gastroesophageal reflux disease)    Hepatitis C    History of kidney stones    H/O   Hyperlipidemia    Hypertension 06/10/2018   currently not under control   Left inguinal hernia    Peripheral vascular disease (HCC)    Restless legs    Schizoaffective disorder (HCC)    Pt  denies   Thrombocytopenia (HCC)    Vitamin D deficiency    Wears dentures    full upper and lower    Medications:  Apixaban 5 mg BID - last dose 5/31 @ 0952  Assessment: 67 y.o. male with a history of persistent afib/flutter s/p faild DCCV, PAD, CHF on apixaban, evaluated for Afib w/ RVR General cardiology team is planning for left and right heart catheterization on Monday 01/29/23. Pharmacy has been consulted to initiate and manage IV heparin therapy.     Goal of Therapy:  Heparin level 0.3-0.7 units/ml aPTT 66-102 seconds Monitor platelets by anticoagulation protocol: Yes   06/01 0534 aPTT 81, therapeutic x 1 06/01 1239 aPTT 83,  therapeutic x1  cont at 1100 u/hr 06/02 0520 aPTT 114, supratherapeutic / HL 1.01 correlating  Plan:  Decrease heparin infusion to 1000 units/hr Check next aPTT in 6 hrs after rate change HL daily until correlation with aPTT confirmed, then change to follow HL Heart cath planned for Monday 6/3 Continue to monitor H&H and platelets  Otelia Sergeant, PharmD, [facility] 01/28/2023 6:39 AM

## 2023-01-28 NOTE — H&P (View-Only) (Signed)
 Rounding Note    Patient Name: Mark Nash Date of Encounter: 01/28/2023  [facility] Cardiologist: Timothy Gollan, MD   Subjective   Afib/flutter rates 80-100. Patient reports breathing is much bette, but still a little short. No chest pain. Plan for cath tomorrow.   Inpatient Medications    Scheduled Meds:  dapagliflozin propanediol  10 mg Oral Daily   fluticasone furoate-vilanterol  1 puff Inhalation Daily   And   umeclidinium bromide  1 puff Inhalation Daily   gabapentin  300 mg Oral q AM   And   gabapentin  300 mg Oral Q1200   And   gabapentin  600 mg Oral QHS   insulin aspart  0-9 Units Subcutaneous TID WC   insulin aspart  5 Units Subcutaneous TID WC   insulin detemir  8 Units Subcutaneous QHS   metoprolol tartrate  50 mg Oral TID   polyethylene glycol  34 g Oral Daily   pravastatin  20 mg Oral QHS   predniSONE  30 mg Oral Q breakfast   sodium chloride flush  3 mL Intravenous Q12H   traMADol  50 mg Oral Q12H   Continuous Infusions:  sodium chloride 75 mL/hr at 01/28/23 0725   heparin 1,000 Units/hr (01/28/23 0734)   PRN Meds: bisacodyl, bisacodyl, guaiFENesin, ipratropium-albuterol, levalbuterol, magnesium citrate, ondansetron **OR** ondansetron (ZOFRAN) IV   Vital Signs    Vitals:   01/27/23 2055 01/27/23 2135 01/27/23 2316 01/28/23 0427  BP: 130/78 128/84 123/87 (!) 143/75  Pulse: 70 92 91 90  Resp: 18 (!) 25 20 (!) 21  Temp: 97.7 F (36.5 C)  98 F (36.7 C) 97.9 F (36.6 C)  TempSrc: Oral  Oral Oral  SpO2: 98%  97% 95%  Weight:    61.2 kg    Intake/Output Summary (Last 24 hours) at 01/28/2023 0739 Last data filed at 01/28/2023 0725 Gross per 24 hour  Intake 1734.12 ml  Output 2355 ml  Net -620.88 ml      01/28/2023    4:27 AM 01/27/2023    3:37 AM 01/26/2023    5:00 AM  Last 3 Weights  Weight (lbs) 135 lb 134 lb 9.6 oz 141 lb 12.1 oz  Weight (kg) 61.236 kg 61.054 kg 64.3 kg      Telemetry    Afib/flutter HR 80-100s -  Personally Reviewed  ECG    No new - Personally Reviewed  Physical Exam   GEN: No acute distress.   Neck: No JVD Cardiac: RRR, no murmurs, rubs, or gallops.  Respiratory: Clear to auscultation bilaterally. GI: Soft, nontender, non-distended  MS: No edema; No deformity. Neuro:  Nonfocal  Psych: Normal affect   Labs    High Sensitivity Troponin:   Recent Labs  Lab 01/13/23 0255 01/13/23 0431  TROPONINIHS 19* 24*     Chemistry Recent Labs  Lab 01/22/23 0615 01/24/23 0521 01/26/23 0545 01/27/23 0534 01/27/23 1906 01/28/23 0520  NA 133*   < > 132* 129* 132* 133*  K 4.4   < > 5.0 5.0 5.3* 4.6  CL 101   < > 96* 93* 96* 99  CO2 28   < > 27 27 27 28  GLUCOSE 145*   < > 216* 250* 177* 111*  BUN 21   < > 32* 45* 48* 36*  CREATININE 0.75   < > 0.84 0.91 1.00 0.82  CALCIUM 8.5*   < > 9.2 9.0 8.8* 8.4*  MG 2.6*  --  2.4  --   --   --     PROT 6.2*  --   --   --   --   --   ALBUMIN 3.5  --   --   --   --   --   AST 20  --   --   --   --   --   ALT 27  --   --   --   --   --   ALKPHOS 87  --   --   --   --   --   BILITOT 1.2  --   --   --   --   --   GFRNONAA >60   < > >60 >60 >60 >60  ANIONGAP 4*   < > 9 9 9 6   < > = values in this interval not displayed.    Lipids No results for input(s): "CHOL", "TRIG", "HDL", "LABVLDL", "LDLCALC", "CHOLHDL" in the last 168 hours.  Hematology Recent Labs  Lab 01/26/23 0545 01/27/23 0534 01/28/23 0520  WBC 12.6* 11.8* 12.1*  RBC 5.12 5.06 4.57  HGB 15.6 15.7 14.0  HCT 46.7 45.8 41.6  MCV 91.2 90.5 91.0  MCH 30.5 31.0 30.6  MCHC 33.4 34.3 33.7  RDW 13.4 13.3 13.6  PLT 300 303 248   Thyroid  Recent Labs  Lab 01/27/23 1239  TSH 1.455    BNPNo results for input(s): "BNP", "PROBNP" in the last 168 hours.  DDimer  Recent Labs  Lab 01/21/23 1720  DDIMER <0.27     Radiology    ECHOCARDIOGRAM LIMITED  Result Date: 01/26/2023    ECHOCARDIOGRAM LIMITED REPORT   Patient Name:   Zain M Snowden Date of Exam: 01/26/2023  Medical Rec #:  [id number]       Height:       71.0 in Accession #:    [id number]      Weight:       141.8 lb Date of Birth:  02/23/1956       BSA:          1.822 m Patient Age:    66 years        BP:           139/87 mmHg Patient Gender: M               HR:           109 bpm. Exam Location:  [facility] Procedure: Limited Echo Indications:     CHF  History:         Patient has prior history of Echocardiogram examinations, most                  recent 09/11/2022. CHF, COPD, Arrythmias:Atrial Fibrillation,                  Signs/Symptoms:Shortness of Breath; Risk Factors:Hypertension,                  Diabetes, Dyslipidemia and Current Smoker. Definity was not                  used due to poor windows.  Sonographer:     Dorothy Buchanan Referring Phys:  [id number] Ashar Lewinski H Tarnesha Ulloa Diagnosing Phys: Muhammad Arida MD  Sonographer Comments: Technically challenging study due to limited acoustic windows, no apical window and suboptimal parasternal window. IMPRESSIONS  1. Left ventricular ejection fraction, by estimation, is 30 to 35%. The left ventricle has moderately decreased function. Left ventricular endocardial border not optimally defined to evaluate regional   wall motion.  2. Right ventricular systolic function is normal. The right ventricular size is normal.  3. Left atrial size was moderately dilated.  4. The mitral valve is normal in structure. No evidence of mitral valve regurgitation. No evidence of mitral stenosis.  5. The aortic valve is calcified. Aortic valve regurgitation is not visualized. No aortic stenosis is present.  6. Aortic dilatation noted. There is mild dilatation of the aortic root, measuring 41 mm.  7. Challenging image quality. Most valves not well seen. FINDINGS  Left Ventricle: Left ventricular ejection fraction, by estimation, is 30 to 35%. The left ventricle has moderately decreased function. Left ventricular endocardial border not optimally defined to evaluate regional wall motion. The left  ventricular internal cavity size was normal in size. There is no left ventricular hypertrophy. Right Ventricle: The right ventricular size is normal. No increase in right ventricular wall thickness. Right ventricular systolic function is normal. Left Atrium: Left atrial size was moderately dilated. Right Atrium: Right atrial size was normal in size. Pericardium: There is no evidence of pericardial effusion. Mitral Valve: The mitral valve is normal in structure. There is mild thickening of the mitral valve leaflet(s). No evidence of mitral valve stenosis. Tricuspid Valve: The tricuspid valve is normal in structure. Tricuspid valve regurgitation is not demonstrated. No evidence of tricuspid stenosis. Aortic Valve: The aortic valve is calcified. Aortic valve regurgitation is not visualized. No aortic stenosis is present. Pulmonic Valve: The pulmonic valve was normal in structure. Pulmonic valve regurgitation is not visualized. No evidence of pulmonic stenosis. Aorta: Aortic dilatation noted. There is mild dilatation of the aortic root, measuring 41 mm. Venous: The inferior vena cava was not well visualized. IAS/Shunts: No atrial level shunt detected by color flow Doppler. LEFT VENTRICLE PLAX 2D LVIDd:         4.90 cm LVIDs:         4.20 cm LV PW:         1.00 cm LV IVS:        0.90 cm LVOT diam:     2.30 cm LVOT Area:     4.15 cm  LEFT ATRIUM         Index LA diam:    5.00 cm 2.74 cm/m   AORTA Ao Root diam: 4.10 cm  SHUNTS Systemic Diam: 2.30 cm Muhammad Arida MD Electronically signed by Muhammad Arida MD Signature Date/Time: 01/26/2023/3:04:26 PM    Final     Cardiac Studies    Myoview lexiscan 12/2022 Narrative & Impression      Sensitivity and specificity of the study are significant reduced due to artifact.  The resting images are uninterpretable due to severe extracardiac activity.   Abnormal, probably low risk pharmacologic myocardial.   There is a small in size, mild in severity, fixed apical anterior  and apical defect most consistent with artifact but cannot rule out an element of scar.   There is no evidence of significant ischemia.   Left ventricular systolic function is moderately reduced (LVEF 35-45%).   Coronary artery calcification and aortic atherosclerosis are present.   No significant change from prior study on 05/12/2021 allowing for differences in artifact.      Echo 08/2022  1. Left ventricular ejection fraction, by estimation, is 45 to 50%. The  left ventricle has mildly decreased function. The left ventricle  demonstrates global hypokinesis. The left ventricular internal cavity size  was mildly dilated. Left ventricular  diastolic parameters are indeterminate.   2. Right ventricular systolic   function is normal. The right ventricular  size is not well visualized.   3. Left atrial size was mildly dilated.   4. Right atrial size was mildly dilated.   5. The mitral valve is grossly normal. No evidence of mitral valve  regurgitation.   6. The aortic valve is calcified. Aortic valve regurgitation is not  visualized.   7. The inferior vena cava is dilated in size with <50% respiratory  variability, suggesting right atrial pressure of 15 mmHg.   8. Rhythm strip during this exam demonstrates atrial flutter.   Comparison(s): 06/28/21 EF 55-60%.    Echo 06/2021 1. Left ventricular ejection fraction, by estimation, is 55 to 60%. The  left ventricle has normal function. The left ventricle has no regional  wall motion abnormalities. Left ventricular diastolic parameters are  indeterminate.   2. Right ventricular systolic function is normal. The right ventricular  size is normal.   3. Left atrial size was mildly dilated.   4. The mitral valve was not well visualized. No evidence of mitral valve  regurgitation. No evidence of mitral stenosis.   5. The aortic valve was not well visualized. Aortic valve regurgitation  is not visualized. Mild to moderate aortic valve  sclerosis/calcification  is present, without any evidence of aortic stenosis.   6. There is dilatation of the ascending aorta, measuring 37 mm. There is  dilatation of the aortic root, measuring 36 mm.   7. The inferior vena cava is normal in size with greater than 50%  respiratory variability, suggesting right atrial pressure of 3 mmHg.   Comparison(s): EF 55%, technically challenging due to limited acoustic  windows.   Patient Profile     66 y.o. male with a hx of HTN, HLD, DM2, PAD, COPD/asthma not O2 dependent, persistent afib/flutter on Eliquis, HFmrEF, hep C, schizoaffective/schizophrenia disorder, tobacco use who is being seen 01/25/2023 for the evaluation of rapid afib/flutter and newly worsening LVEF  Assessment & Plan    Rapid Afib/flutter H/o persistent Afib/flutter  - Rapid afib/flutter in the setting of acute respiratory failure with hypoxia, COPD exacerbation, Rhinovirus, possible PNA - h/o persistent Afib flutter with prior failed cardioversion. Seen by EP and not felt to be a good ablation candidate - PTA rate controlled on diltiazem 240mg  - rate control with metoprolol 50mg TID - Eliquis 5mg BID for stroke ppx>held for IV heparin - CHADSVASC at least 4 (HTN, DM2, PAD, age) - Rates 80-100 - EP saw and agree with rate control for now   HFrEF - Echo 2022 with normal LVEF - Echo in 08/2022 showed LVEF 45-50% - echo this admission showed newly reduced LVEF 30-35% - patient appears euvolemic - PTA Losartan and Farxiga. held on admission - plan for R/L heart cath Monday Risks and benefits of cardiac catheterization have been discussed with the patient.  These include bleeding, infection, kidney damage, stroke, heart attack, death.  The patient understands these risks and is willing to proceed.    HTN - Bps good   Acute respiratory failure Acute COPD exacerbation, Rhinovirus, ?PNA - treatment per IM  For questions or updates, please contact [facility] Please consult [email or web address] for contact info under        Signed, Hayley Horn H Walden Statz, PA-C  01/28/2023, 7:39 AM    

## 2023-01-28 NOTE — Progress Notes (Signed)
Progress Note    Mark Nash  [name]:[id number] DOB: 03-30-1956  DOA: 01/21/2023 PCP: Alba Cory, MD      Brief Narrative:    Medical records reviewed and are as summarized below:  Mark Nash is a 67 y.o. male  with medical history significant of  hypertension,HLD, type 2 diabetes, PAD, COPD/asthma not O2 dependent, atrial fibrillation on chronic anticoagulation with apixaban, CHFef 40-45%, hepc C, schizoaffective/schizophrenia disorder, tobacco use, who has interim history of visit to ED on 5/18 with similar complaints of shortness of breath.  He was diagnosed with COPD exacerbation and discharge to home prednisone and doxycycline at that time.  He presented to the hospital again on 01/21/2023 with cough, congestion and shortness of breath.  He also reported some dark stools about 3 days prior to admission.   He was admitted to the hospital for COPD exacerbation.  He was treated with steroids, bronchodilators and empiric antibiotics.  Respiratory viral panel was positive for rhinovirus which probably triggered COPD exacerbation.    Assessment/Plan:   Principal Problem:   COPD exacerbation (HCC) Active Problems:   Paranoid schizophrenia (HCC)   Peripheral vascular disease (HCC)   Type 2 diabetes mellitus with diabetic polyneuropathy, with long-term current use of insulin (HCC)   Rhinovirus infection   Acute respiratory failure with hypoxia (HCC)   Chronic atrial flutter (HCC)   Acute systolic heart failure (HCC)   Acute on chronic combined systolic and diastolic CHF (congestive heart failure) (HCC)   Pressure injury of skin   COPD exacerbation / Rhinovirus infection  Respiratory viral panel was positive for rhinovirus.   Continue prednisone and bronchodilators.   Completed 3 days of azithromycin on 01/24/2023.   --Scheduled and PRN bronchodilators --Initially completed 5 days steroids  --Resumed prednisone 40 mg due to recurrent wheezing. Begin tapering -  dose reduced to 30 mg --Stop Rocephin -- normal respiratory flora on sputum cx  --Flutter valve as needed.   --On room air, not hypoxic - monitor closely. --Droplet precautions. --Symptomatic/supportive care  Acute respirtatory failure with hypoxia -- resolved. Per EDP documentation, arrived hypoxic with sats in 80's on room air.  Now on room air. Mgmt as above.  Supplement o2 if needed to keep sats > 88%  Paroxysmal atrial fibrillation/atrial flutter with RVR: Heart rate was better, but today was 100's to 120 on monitor during rounds.  Afternoon today, HR 140's.  Initially on Cardizem drip --Consult Cardiology --Planning for R/L heart cath on Monday  --Continue oral Cardizem. --Lopressor 12.5 mg Q6H added 5/30   --Hold Eliquis.  On heparin drip for procedure Monday. --Telemetry  Hyponatremia -- Na trended down, 129 (6/1), improved to 133 with gentle IV fluids. --checked serum osm, urine osm and urine sodium, TSH --appears hypovolemic  --started NS @ 75 cc/hr on afternoon 6/1 -- stop this evening & monitor --repeat BMP in AM --monitor volume status closely with reduced EF  Recent black stools: No acute overt bleeding at this time.   H&H is stable.  Resumed Eliquis.  Discontinue IV Protonix.   Monitor CBC   Chronic systolic CHF: Compensated.   2D echo in January 2024 showed EF estimated at 40 to 45%. Repeat echo this admission, EF 30-35% --Cardiology following for A-fib/flutter RVR --R/L cath planned Monday  Type II DM with hyperglycemia:  --Continue insulin glargine and Farxiga.   --Added NovoLog 4 units 3 times daily with meals,  --Increase Novolog to 5 units with some CBG's in 200's --Also  on sliding scale  Constipation -- bowel regimen per orders.   Dyslipidemia -- on pravastatin  Other comorbidities include hypertension, PVD, hyperlipidemia, schizoaffective disorder, history of hepatitis C, lumbar degenerative disc  disease    Consultants: None  Procedures: None     Family Communication/Anticipated D/C date and plan/Code Status   DVT prophylaxis: SCDs Start: 01/21/23 0909     Code Status: Full Code  Family Communication: None Disposition Plan: Home when further improved, HR's controlled   Status is: Inpatient Remains inpatient appropriate because: COPD exacerbation, A-fib/flutter RVR with ongoing evaluation per cardiology.    Subjective:   Pt seated edge of bed this AM.  He reports feeling overall better.  Cough starting to become somewhat more productive, clearing phlegm.  No other acute complaints.  Dyspnea with ambulation slowly improving.  Objective:    Vitals:   01/28/23 0427 01/28/23 0818 01/28/23 1100 01/28/23 1202  BP: (!) 143/75 (!) 136/90 (!) 150/126 133/86  Pulse: 90 (!) 107 (!) 102 67  Resp: (!) 21 (!) 21 (!) 21 16  Temp: 97.9 F (36.6 C) 97.7 F (36.5 C) 98 F (36.7 C) (!) 97.4 F (36.3 C)  TempSrc: Oral Oral Oral   SpO2: 95% 95%  92%  Weight: 61.2 kg      No data found.   Intake/Output Summary (Last 24 hours) at 01/28/2023 1447 Last data filed at 01/28/2023 1154 Gross per 24 hour  Intake 1734.12 ml  Output 2375 ml  Net -640.88 ml   Filed Weights   01/26/23 0500 01/27/23 0337 01/28/23 0427  Weight: 64.3 kg 61.1 kg 61.2 kg    Exam:  General exam: awake, alert, no acute distress HEENT: moist mucus membranes, hearing grossly normal  Respiratory system: CTAB but generally diminished, normal respiratory effort. On room air Cardiovascular system: irregularly irregular, no pedal edema.   Gastrointestinal system: soft, NT, ND Central nervous system: A&O x 3. no gross focal neurologic deficits, normal speech Extremities: moves all, no edema, normal tone Skin: dry, intact, normal temperature Psychiatry: normal mood, congruent affect     Data Reviewed:   Notable labs --- Na improved 129 >> 133 with fluids, glucose 111, BUN 36, WBC 12.1  TSH normal  1.455 Serum osmolality 197 elevated Urine osmolality 505, urine sodium 20   Micro --  Sputum culture grew normal respiratory flora, no MRSA or pseudomonas. Final.    LOS: 7 days   Pennie Banter, DO  [facility]   Pager on [email or web address]. If 7PM-7AM, please contact night-coverage at [email or web address]     01/28/2023, 2:47 PM

## 2023-01-29 ENCOUNTER — Other Ambulatory Visit (HOSPITAL_COMMUNITY): Payer: Self-pay

## 2023-01-29 ENCOUNTER — Encounter
Admission: EM | Disposition: A | Discharge status: Home / Self Care | Payer: Self-pay | Source: Home / Self Care | Attending: Internal Medicine

## 2023-01-29 DIAGNOSIS — I272 Pulmonary hypertension, unspecified: Secondary | ICD-10-CM | POA: Diagnosis not present

## 2023-01-29 DIAGNOSIS — J441 Chronic obstructive pulmonary disease with (acute) exacerbation: Secondary | ICD-10-CM | POA: Diagnosis not present

## 2023-01-29 DIAGNOSIS — I428 Other cardiomyopathies: Secondary | ICD-10-CM | POA: Diagnosis not present

## 2023-01-29 DIAGNOSIS — I5023 Acute on chronic systolic (congestive) heart failure: Secondary | ICD-10-CM | POA: Diagnosis not present

## 2023-01-29 HISTORY — PX: RIGHT/LEFT HEART CATH AND CORONARY ANGIOGRAPHY: CATH118266

## 2023-01-29 LAB — CBC
HCT: 42.6 % (ref 39.0–52.0)
Hemoglobin: 13.9 g/dL (ref 13.0–17.0)
MCH: 30.2 pg (ref 26.0–34.0)
MCHC: 32.6 g/dL (ref 30.0–36.0)
MCV: 92.4 fL (ref 80.0–100.0)
Platelets: 236 10*3/uL (ref 150–400)
RBC: 4.61 MIL/uL (ref 4.22–5.81)
RDW: 13.5 % (ref 11.5–15.5)
WBC: 12.2 10*3/uL — ABNORMAL HIGH (ref 4.0–10.5)
nRBC: 0 % (ref 0.0–0.2)

## 2023-01-29 LAB — GLUCOSE, CAPILLARY
Glucose-Capillary: 58 mg/dL — ABNORMAL LOW (ref 70–99)
Glucose-Capillary: 113 mg/dL — ABNORMAL HIGH (ref 70–99)
Glucose-Capillary: 103 mg/dL — ABNORMAL HIGH (ref 70–99)
Glucose-Capillary: 99 mg/dL (ref 70–99)
Glucose-Capillary: 171 mg/dL — ABNORMAL HIGH (ref 70–99)
Glucose-Capillary: 74 mg/dL (ref 70–99)

## 2023-01-29 LAB — POCT I-STAT 7, (LYTES, BLD GAS, ICA,H+H)
Acid-Base Excess: 5 mmol/L — ABNORMAL HIGH (ref 0.0–2.0)
Bicarbonate: 32.3 mmol/L — ABNORMAL HIGH (ref 20.0–28.0)
Calcium, Ion: 1.23 mmol/L (ref 1.15–1.40)
HCT: 42 % (ref 39.0–52.0)
Hemoglobin: 14.3 g/dL (ref 13.0–17.0)
O2 Saturation: 62 %
Potassium: 4.2 mmol/L (ref 3.5–5.1)
Sodium: 135 mmol/L (ref 135–145)
TCO2: 34 mmol/L — ABNORMAL HIGH (ref 22–32)
pCO2 arterial: 59.6 mmHg — ABNORMAL HIGH (ref 32–48)
pH, Arterial: 7.342 — ABNORMAL LOW (ref 7.35–7.45)
pO2, Arterial: 35 mmHg — CL (ref 83–108)

## 2023-01-29 LAB — POCT I-STAT EG7
Acid-Base Excess: 4 mmol/L — ABNORMAL HIGH (ref 0.0–2.0)
Bicarbonate: 30 mmol/L — ABNORMAL HIGH (ref 20.0–28.0)
Calcium, Ion: 1.22 mmol/L (ref 1.15–1.40)
HCT: 41 % (ref 39.0–52.0)
Hemoglobin: 13.9 g/dL (ref 13.0–17.0)
O2 Saturation: 89 %
Potassium: 4.2 mmol/L (ref 3.5–5.1)
Sodium: 134 mmol/L — ABNORMAL LOW (ref 135–145)
TCO2: 31 mmol/L (ref 22–32)
pCO2, Ven: 49.7 mmHg (ref 44–60)
pH, Ven: 7.389 (ref 7.25–7.43)
pO2, Ven: 58 mmHg — ABNORMAL HIGH (ref 32–45)

## 2023-01-29 LAB — HEPARIN LEVEL (UNFRACTIONATED): Heparin Unfractionated: 0.67 IU/mL (ref 0.30–0.70)

## 2023-01-29 LAB — APTT: aPTT: 85 seconds — ABNORMAL HIGH (ref 24–36)

## 2023-01-29 SURGERY — RIGHT/LEFT HEART CATH AND CORONARY ANGIOGRAPHY
Anesthesia: Moderate Sedation

## 2023-01-29 MED ORDER — SODIUM CHLORIDE 0.9 % IV SOLN: 250.0000 mL | INTRAVENOUS | Status: DC | PRN

## 2023-01-29 MED ORDER — DEXTROSE 50 % IV SOLN
INTRAVENOUS | Status: AC
  Filled 2023-01-29: qty 50

## 2023-01-29 MED ORDER — HEPARIN (PORCINE) IN NACL 1000-0.9 UT/500ML-% IV SOLN
INTRAVENOUS | Status: AC
  Filled 2023-01-29: qty 1000

## 2023-01-29 MED ORDER — FENTANYL CITRATE (PF) 100 MCG/2ML IJ SOLN
INTRAMUSCULAR | Status: DC | PRN
  Administered 2023-01-29: 25 ug via INTRAVENOUS

## 2023-01-29 MED ORDER — HEPARIN (PORCINE) 25000 UT/250ML-% IV SOLN
1000.0000 [IU]/h | INTRAVENOUS | Status: DC
  Administered 2023-01-29: 1000 [IU]/h via INTRAVENOUS
  Filled 2023-01-29: qty 250

## 2023-01-29 MED ORDER — SODIUM CHLORIDE 0.9 % IV SOLN: INTRAVENOUS | Status: AC

## 2023-01-29 MED ORDER — LOSARTAN POTASSIUM 25 MG PO TABS
12.5000 mg | ORAL_TABLET | Freq: Every day | ORAL | Status: DC
  Administered 2023-01-29: 12.5 mg via ORAL

## 2023-01-29 MED ORDER — SODIUM CHLORIDE 0.9% FLUSH: 3.0000 mL | INTRAVENOUS | Status: DC | PRN

## 2023-01-29 MED ORDER — DEXTROSE 50 % IV SOLN
12.5000 g | Freq: Once | INTRAVENOUS | Status: AC
  Administered 2023-01-29: 12.5 g via INTRAVENOUS

## 2023-01-29 MED ORDER — FENTANYL CITRATE (PF) 100 MCG/2ML IJ SOLN
INTRAMUSCULAR | Status: AC
  Filled 2023-01-29: qty 2

## 2023-01-29 MED ORDER — MIDAZOLAM HCL 2 MG/2ML IJ SOLN
INTRAMUSCULAR | Status: AC
  Filled 2023-01-29: qty 2

## 2023-01-29 MED ORDER — IOHEXOL 300 MG/ML  SOLN
INTRAMUSCULAR | Status: DC | PRN
  Administered 2023-01-29: 30 mL

## 2023-01-29 MED ORDER — HEPARIN SODIUM (PORCINE) 1000 UNIT/ML IJ SOLN
INTRAMUSCULAR | Status: AC
  Filled 2023-01-29: qty 10

## 2023-01-29 MED ORDER — VERAPAMIL HCL 2.5 MG/ML IV SOLN
INTRAVENOUS | Status: AC
  Filled 2023-01-29: qty 2

## 2023-01-29 MED ORDER — MIDAZOLAM HCL 2 MG/2ML IJ SOLN
INTRAMUSCULAR | Status: DC | PRN
  Administered 2023-01-29: 1 mg via INTRAVENOUS

## 2023-01-29 MED ORDER — VERAPAMIL HCL 2.5 MG/ML IV SOLN
INTRAVENOUS | Status: DC | PRN
  Administered 2023-01-29: 2.5 mg via INTRA_ARTERIAL

## 2023-01-29 MED ORDER — HEPARIN (PORCINE) IN NACL 1000-0.9 UT/500ML-% IV SOLN
INTRAVENOUS | Status: DC | PRN
  Administered 2023-01-29 (×2): 500 mL

## 2023-01-29 MED ORDER — HEPARIN SODIUM (PORCINE) 1000 UNIT/ML IJ SOLN
INTRAMUSCULAR | Status: DC | PRN
  Administered 2023-01-29: 3000 [IU] via INTRAVENOUS

## 2023-01-29 MED ORDER — SODIUM CHLORIDE 0.9% FLUSH
3.0000 mL | Freq: Two times a day (BID) | INTRAVENOUS | Status: DC
  Administered 2023-01-29 – 2023-01-31 (×5): 3 mL via INTRAVENOUS

## 2023-01-29 MED ORDER — LIDOCAINE HCL (PF) 1 % IJ SOLN
INTRAMUSCULAR | Status: DC | PRN
  Administered 2023-01-29: 5 mL

## 2023-01-29 MED ORDER — METOPROLOL SUCCINATE ER 50 MG PO TB24
ORAL_TABLET | ORAL | Status: AC
  Filled 2023-01-29: qty 4

## 2023-01-29 MED ORDER — PREDNISONE 20 MG PO TABS
20.0000 mg | ORAL_TABLET | Freq: Every day | ORAL | Status: DC
  Administered 2023-01-30 – 2023-01-31 (×2): 20 mg via ORAL
  Filled 2023-01-29 (×2): qty 1

## 2023-01-29 SURGICAL SUPPLY — 13 items
CATH BALLN WEDGE 5F 110CM (CATHETERS) IMPLANT
CATH INFINITI AMBI 5FR JK (CATHETERS) IMPLANT
DEVICE RAD TR BAND REGULAR (VASCULAR PRODUCTS) IMPLANT
DRAPE BRACHIAL (DRAPES) IMPLANT
DRAPE FEMORAL ANGIO W/ POUCH (DRAPES) IMPLANT
GLIDESHEATH SLEND SS 6F .021 (SHEATH) IMPLANT
GUIDEWIRE INQWIRE 1.5J.035X260 (WIRE) IMPLANT
INQWIRE 1.5J .035X260CM (WIRE) ×1
PACK CARDIAC CATH (CUSTOM PROCEDURE TRAY) ×1 IMPLANT
PROTECTION STATION PRESSURIZED (MISCELLANEOUS) ×1
SET ATX-X65L (MISCELLANEOUS) IMPLANT
SHEATH GLIDE SLENDER 4/5FR (SHEATH) IMPLANT
STATION PROTECTION PRESSURIZED (MISCELLANEOUS) IMPLANT

## 2023-01-29 NOTE — Progress Notes (Signed)
Progress Note    Mark Nash  ZOX:[id number] DOB: 1956/02/23  DOA: 01/21/2023 PCP: Alba Cory, MD      Brief Narrative:    Medical records reviewed and are as summarized below:  Mark Nash is a 67 y.o. male  with medical history significant of  hypertension,HLD, type 2 diabetes, PAD, COPD/asthma not O2 dependent, atrial fibrillation on chronic anticoagulation with apixaban, CHFef 40-45%, hepc C, schizoaffective/schizophrenia disorder, tobacco use, who has interim history of visit to ED on 5/18 with similar complaints of shortness of breath.  He was diagnosed with COPD exacerbation and discharge to home prednisone and doxycycline at that time.  He presented to the hospital again on 01/21/2023 with cough, congestion and shortness of breath.  He also reported some dark stools about 3 days prior to admission.   He was admitted to the hospital for COPD exacerbation.  He was treated with steroids, bronchodilators and empiric antibiotics.  Respiratory viral panel was positive for rhinovirus which probably triggered COPD exacerbation.    Assessment/Plan:   Principal Problem:   COPD exacerbation (HCC) Active Problems:   Paranoid schizophrenia (HCC)   Peripheral vascular disease (HCC)   Type 2 diabetes mellitus with diabetic polyneuropathy, with long-term current use of insulin (HCC)   Rhinovirus infection   Acute respiratory failure with hypoxia (HCC)   Chronic atrial flutter (HCC)   Acute systolic heart failure (HCC)   Acute on chronic combined systolic and diastolic CHF (congestive heart failure) (HCC)   Pressure injury of skin   COPD exacerbation / Rhinovirus infection  Respiratory viral panel was positive for rhinovirus.   Continue prednisone and bronchodilators.   Completed 3 days of azithromycin on 01/24/2023.   --Scheduled and PRN bronchodilators --Initially completed 5 days steroids  --Resumed prednisone 40 mg due to recurrent wheezing.  --Begin tapering -  Prednisone dose reduced to 30 mg, continue tapering > 20 mg tomorrow --Stopped Rocephin -- normal respiratory flora on sputum cx  --Flutter valve as needed.   --On room air, not hypoxic - monitor closely. --Droplet precautions. --Symptomatic/supportive care  Acute respirtatory failure with hypoxia -- resolved. Per EDP documentation, arrived hypoxic with sats in 80's on room air.  Now on room air. Mgmt as above.  Supplement o2 if needed to keep sats > 88%  Paroxysmal atrial fibrillation/atrial flutter with RVR: Heart rate was better, but today was 100's to 120 on monitor during rounds.  Afternoon today, HR 140's.  Initially on Cardizem drip --Consult Cardiology --Planning for R/L heart cath on Monday  --Continue oral Cardizem. --Lopressor 12.5 mg Q6H added 5/30   --Hold Eliquis.  On heparin drip for cath today.  Per Cardiology, may resume Eliquis this evening. --Telemetry  Hyponatremia -- Na trended down, 129 (6/1), improved to 133 with gentle IV fluids. --checked serum osm, urine osm and urine sodium, TSH --appears hypovolemic - responded well to fluids --off fluids since evening 6/2 --repeat BMP in AM --monitor volume status closely  Recent black stools: No acute overt bleeding at this time.   H&H is stable.  Resumed Eliquis.  Discontinue IV Protonix.   Monitor CBC   Chronic systolic CHF: Compensated.   2D echo in January 2024 showed EF estimated at 40 to 45%. Repeat echo this admission, EF 30-35% --Cardiology following for A-fib/flutter RVR --R/L cath planned Monday  Type II DM with hyperglycemia:  --Continue insulin glargine and Farxiga.   --Added NovoLog 4 units 3 times daily with meals,  --Increase Novolog to  5 units with some CBG's in 200's --Also on sliding scale  Constipation -- bowel regimen per orders.   Dyslipidemia -- on pravastatin  Other comorbidities include hypertension, PVD, hyperlipidemia, schizoaffective disorder, history of hepatitis C, lumbar  degenerative disc disease    Consultants: Cardiology  Procedures: R/L heart cath 6/3 -- see report.  Nonobstructive CAD. Normal filling pressures.     Family Communication/Anticipated D/C date and plan/Code Status   DVT prophylaxis: SCDs Start: 01/21/23 0909     Code Status: Full Code  Family Communication: None  Disposition Plan: Home when further improved, HR's controlled, cleared by cardiology Potential d/c tomorrow 6/4   Status is: Inpatient Remains inpatient appropriate because: COPD exacerbation, A-fib/flutter RVR with ongoing evaluation per cardiology.    Subjective:   Pt doing well today.  No acute complaints.  HR's better.  SOB improved.   Objective:    Vitals:   01/29/23 1100 01/29/23 1200 01/29/23 1211 01/29/23 1311  BP: (!) 131/108  (!) 147/96 (!) 138/102  Pulse: 85 81 82 (!) 58  Resp: 18  20 17   Temp:    97.8 F (36.6 C)  TempSrc:    Oral  SpO2: 92% 93% 94% 92%  Weight:       No data found.   Intake/Output Summary (Last 24 hours) at 01/29/2023 1506 Last data filed at 01/29/2023 1503 Gross per 24 hour  Intake 3 ml  Output 3350 ml  Net -3347 ml   Filed Weights   01/26/23 0500 01/27/23 0337 01/28/23 0427  Weight: 64.3 kg 61.1 kg 61.2 kg    Exam:  General exam: awake, alert, no acute distress HEENT: moist mucus membranes, hearing grossly normal  Respiratory system: normal respiratory effort. On room air, clear no wheezes Cardiovascular system: irregularly irregular, no pedal edema.   Central nervous system: A&O x 3. no gross focal neurologic deficits, normal speech Extremities: moves all, no edema, normal tone Skin: dry, intact, normal temperature Psychiatry: normal mood, congruent affect     Data Reviewed:   Notable labs --- Na improved 134, 135, WBC 12.2. ABG 7.342, pCO2 59.6, pO2 35  TSH normal 1.455 Serum osmolality 197 elevated Urine osmolality 505, urine sodium 20   Micro --  Sputum culture grew normal respiratory  flora, no MRSA or pseudomonas. Final.    LOS: 8 days   Pennie Banter, DO  [facility]   Pager on [email or web address]. If 7PM-7AM, please contact night-coverage at [email or web address]     01/29/2023, 3:06 PM

## 2023-01-29 NOTE — Interval H&P Note (Signed)
History and Physical Interval Note:  01/29/2023 9:26 AM  Mark Nash  has presented today for surgery, with the diagnosis of cardiomyopathy.  The various methods of treatment have been discussed with the patient and family. After consideration of risks, benefits and other options for treatment, the patient has consented to  Procedure(s): RIGHT/LEFT HEART CATH AND CORONARY ANGIOGRAPHY (N/A) as a surgical intervention.  The patient's history has been reviewed, patient examined, no change in status, stable for surgery.  I have reviewed the patient's chart and labs.  Questions were answered to the patient's satisfaction.     Lorine Bears

## 2023-01-29 NOTE — Inpatient Diabetes Management (Signed)
Inpatient Diabetes Program Recommendations  AACE/ADA: New Consensus Statement on Inpatient Glycemic Control   Target Ranges:  Prepandial:   less than 140 mg/dL      Peak postprandial:   less than 180 mg/dL (1-2 hours)      Critically ill patients:  140 - 180 mg/dL    Latest Reference Range & Units 01/28/23 08:16 01/28/23 11:51 01/28/23 18:06 01/28/23 20:56 01/29/23 07:25  Glucose-Capillary 70 - 99 mg/dL 60 (L) 161 (H) 096 (H) 170 (H) 58 (L)   Review of Glycemic Control  Diabetes history: DM2 Outpatient Diabetes medications:  Levemir 8 units daily, Farxiga 10 mg daily Current orders for Inpatient glycemic control: Levemir 8 units QHS, Novolog 0-9 units TID with meals, Novolog 4 units TID with meals, Farxiga 10 mg daily; Prednisone 30 mg daily  Inpatient Diabetes Program Recommendations:    Insulin: Fasting CBG 58 mg/dl today (60 mg/dl on 0/4/54). Please consider decreasing Levemir to 4 units daily.   Thanks, Orlando Penner, RN, MSN, CDCES Diabetes Coordinator Inpatient Diabetes Program [phone] (Team Pager from 8am to 5pm)

## 2023-01-29 NOTE — TOC Benefit Eligibility Note (Signed)
Patient Advocate Encounter  Insurance verification completed.    The patient is currently admitted and upon discharge could be taking Entresto 24-26 mg.  The current 30 day co-pay is $47.00.   The patient is currently admitted and upon discharge could be taking Farxiga 10 mg.  The current 30 day co-pay is $47.00.   The patient is insured through [employer] Medicare Part D   This test claim was processed through [facility]- copay amounts may vary at other pharmacies due to pharmacy/plan contracts, or as the patient moves through the different stages of their insurance plan.  Mark Nash, CPHT Pharmacy Patient Advocate Specialist [facility] Patient Advocate Team Direct Number: [phone]  Fax: [phone]       

## 2023-01-29 NOTE — Progress Notes (Signed)
Rounding Note    Patient Name: Mark Nash Date of Encounter: 01/29/2023   [facility] Cardiologist: Julien Nordmann, MD   Subjective   He continues to be in atrial flutter but ventricular rate seems to be more controlled.  He is now on Toprol 200 mg once daily.  Coronary angiography showed mild nonobstructive coronary artery disease.  Right heart catheterization showed normal filling pressures.  Inpatient Medications    Scheduled Meds:  [MAR Hold] dapagliflozin propanediol  10 mg Oral Daily   [MAR Hold] fluticasone furoate-vilanterol  1 puff Inhalation Daily   And   [MAR Hold] umeclidinium bromide  1 puff Inhalation Daily   [MAR Hold] gabapentin  300 mg Oral q AM   And   [MAR Hold] gabapentin  300 mg Oral Q1200   And   [MAR Hold] gabapentin  600 mg Oral QHS   [MAR Hold] insulin aspart  0-9 Units Subcutaneous TID WC   [MAR Hold] insulin aspart  4 Units Subcutaneous TID WC   [MAR Hold] insulin detemir  8 Units Subcutaneous QHS   [MAR Hold] metoprolol succinate  200 mg Oral Daily   [MAR Hold] polyethylene glycol  34 g Oral Daily   [MAR Hold] pravastatin  20 mg Oral QHS   [MAR Hold] predniSONE  30 mg Oral Q breakfast   [MAR Hold] sodium chloride flush  3 mL Intravenous Q12H   [MAR Hold] traMADol  50 mg Oral Q12H   Continuous Infusions:  sodium chloride     sodium chloride 1 mL/kg/hr (01/29/23 0653)   heparin Stopped (01/29/23 0912)   PRN Meds: sodium chloride, [MAR Hold] bisacodyl, [MAR Hold] bisacodyl, fentaNYL, [MAR Hold] guaiFENesin, Heparin (Porcine) in NaCl, heparin sodium (porcine), iohexol, [MAR Hold] ipratropium-albuterol, [MAR Hold] levalbuterol, lidocaine (PF), [MAR Hold] magnesium citrate, midazolam, [MAR Hold] ondansetron **OR** [MAR Hold] ondansetron (ZOFRAN) IV, sodium chloride flush, verapamil   Vital Signs    Vitals:   01/29/23 0951 01/29/23 0956 01/29/23 1011 01/29/23 1015  BP: (!) 155/88 (!) 147/111 (!) 138/90 (!) 150/83  Pulse: (!) 105 (!)  102 (!) 57 75  Resp: 16 15 16 16   Temp:      TempSrc:      SpO2: 93% 94% 93% 93%  Weight:        Intake/Output Summary (Last 24 hours) at 01/29/2023 1026 Last data filed at 01/29/2023 0655 Gross per 24 hour  Intake 3 ml  Output 2800 ml  Net -2797 ml       01/28/2023    4:27 AM 01/27/2023    3:37 AM 01/26/2023    5:00 AM  Last 3 Weights  Weight (lbs) 135 lb 134 lb 9.6 oz 141 lb 12.1 oz  Weight (kg) 61.236 kg 61.054 kg 64.3 kg      Telemetry    Afib/flutter HR 80-100s - Personally Reviewed  ECG    No new - Personally Reviewed  Physical Exam   GEN: No acute distress.   Neck: No JVD Cardiac: Irregularly irregular, no murmurs, rubs, or gallops.  Respiratory: Clear to auscultation bilaterally. GI: Soft, nontender, non-distended  MS: No edema; No deformity. Neuro:  Nonfocal  Psych: Normal affect   Labs    High Sensitivity Troponin:   Recent Labs  Lab 01/13/23 0255 01/13/23 0431  TROPONINIHS 19* 24*      Chemistry Recent Labs  Lab 01/26/23 0545 01/27/23 0534 01/27/23 1906 01/28/23 0520 01/29/23 0948  NA 132* 129* 132* 133* 135  K 5.0 5.0 5.3* 4.6  4.2  CL 96* 93* 96* 99  --   CO2 27 27 27 28   --   GLUCOSE 216* 250* 177* 111*  --   BUN 32* 45* 48* 36*  --   CREATININE 0.84 0.91 1.00 0.82  --   CALCIUM 9.2 9.0 8.8* 8.4*  --   MG 2.4  --   --   --   --   GFRNONAA >60 >60 >60 >60  --   ANIONGAP 9 9 9 6   --      Lipids No results for input(s): "CHOL", "TRIG", "HDL", "LABVLDL", "LDLCALC", "CHOLHDL" in the last 168 hours.  Hematology Recent Labs  Lab 01/27/23 0534 01/28/23 0520 01/29/23 0524 01/29/23 0948  WBC 11.8* 12.1* 12.2*  --   RBC 5.06 4.57 4.61  --   HGB 15.7 14.0 13.9 14.3  HCT 45.8 41.6 42.6 42.0  MCV 90.5 91.0 92.4  --   MCH 31.0 30.6 30.2  --   MCHC 34.3 33.7 32.6  --   RDW 13.3 13.6 13.5  --   PLT 303 248 236  --     Thyroid  Recent Labs  Lab 01/27/23 1239  TSH 1.455     BNPNo results for input(s): "BNP", "PROBNP" in the last  168 hours.  DDimer  No results for input(s): "DDIMER" in the last 168 hours.    Radiology    CARDIAC CATHETERIZATION  Result Date: 01/29/2023 1.  Mildly calcified coronary arteries with mild nonobstructive coronary artery disease. 2.  Left ventricular angiography was not performed.  EF was moderately reduced by echo. 3.  Right heart catheterization showed normal filling pressures, mild pulmonary hypertension and normal cardiac output. Recommendations: The patient has nonischemic cardiomyopathy likely tachycardia induced.  Recommend medical therapy. Continue Toprol for anticoagulation. Resume heparin 2 hours after sheath pull and likely can transition to Eliquis later this evening.    Cardiac Studies    Myoview lexiscan 12/2022 Narrative & Impression      Sensitivity and specificity of the study are significant reduced due to artifact.  The resting images are uninterpretable due to severe extracardiac activity.   Abnormal, probably low risk pharmacologic myocardial.   There is a small in size, mild in severity, fixed apical anterior and apical defect most consistent with artifact but cannot rule out an element of scar.   There is no evidence of significant ischemia.   Left ventricular systolic function is moderately reduced (LVEF 35-45%).   Coronary artery calcification and aortic atherosclerosis are present.   No significant change from prior study on 05/12/2021 allowing for differences in artifact.      Echo 08/2022  1. Left ventricular ejection fraction, by estimation, is 45 to 50%. The  left ventricle has mildly decreased function. The left ventricle  demonstrates global hypokinesis. The left ventricular internal cavity size  was mildly dilated. Left ventricular  diastolic parameters are indeterminate.   2. Right ventricular systolic function is normal. The right ventricular  size is not well visualized.   3. Left atrial size was mildly dilated.   4. Right atrial size was mildly  dilated.   5. The mitral valve is grossly normal. No evidence of mitral valve  regurgitation.   6. The aortic valve is calcified. Aortic valve regurgitation is not  visualized.   7. The inferior vena cava is dilated in size with <50% respiratory  variability, suggesting right atrial pressure of 15 mmHg.   8. Rhythm strip during this exam demonstrates atrial flutter.  Comparison(s): 06/28/21 EF 55-60%.    Echo 06/2021 1. Left ventricular ejection fraction, by estimation, is 55 to 60%. The  left ventricle has normal function. The left ventricle has no regional  wall motion abnormalities. Left ventricular diastolic parameters are  indeterminate.   2. Right ventricular systolic function is normal. The right ventricular  size is normal.   3. Left atrial size was mildly dilated.   4. The mitral valve was not well visualized. No evidence of mitral valve  regurgitation. No evidence of mitral stenosis.   5. The aortic valve was not well visualized. Aortic valve regurgitation  is not visualized. Mild to moderate aortic valve sclerosis/calcification  is present, without any evidence of aortic stenosis.   6. There is dilatation of the ascending aorta, measuring 37 mm. There is  dilatation of the aortic root, measuring 36 mm.   7. The inferior vena cava is normal in size with greater than 50%  respiratory variability, suggesting right atrial pressure of 3 mmHg.   Comparison(s): EF 55%, technically challenging due to limited acoustic  windows.   Patient Profile     67 y.o. male with a hx of HTN, HLD, DM2, PAD, COPD/asthma not O2 dependent, persistent afib/flutter on Eliquis, HFmrEF, hep C, schizoaffective/schizophrenia disorder, tobacco use who is being seen 01/25/2023 for the evaluation of rapid afib/flutter and newly worsening LVEF  Assessment & Plan    Rapid Afib/flutter H/o persistent Afib/flutter  - Rapid afib/flutter in the setting of acute respiratory failure with hypoxia, COPD  exacerbation, Rhinovirus, possible PNA - h/o persistent Afib flutter with prior failed cardioversion. Seen by EP and not felt to be a good ablation candidate - PTA rate controlled on diltiazem 240mg   -Continue Toprol 200 mg once daily. -Will resume heparin drip in few hours with plans to transition to Eliquis later today. - CHADSVASC at least 4 (HTN, DM2, PAD, age) - Rates 80-100 - EP saw and agree with rate control for now.  However, with his new cardiomyopathy, I wonder if we need to be more aggressive with rhythm control and consider adding antiarrhythmic medications.  Recommend that he follows up with Dr. Lalla Brothers as an outpatient.   HFrEF - Echo 2022 with normal LVEF - Echo in 08/2022 showed LVEF 45-50% - echo this admission showed newly reduced LVEF 30-35% - patient appears euvolemic -No evidence of volume overload by right heart catheterization.  No need for diuresis.  Continue Toprol.  I resumed losartan.  Continue Farxiga.    Acute respiratory failure Acute COPD exacerbation, Rhinovirus, ?PNA - treatment per IM  Possible discharge home tomorrow.  For questions or updates, please contact [facility] Please consult [email or web address] for contact info under        Signed, Lorine Bears, MD  01/29/2023, 10:26 AM

## 2023-01-29 NOTE — Consult Note (Signed)
ANTICOAGULATION CONSULT NOTE -   Pharmacy Consult for heparin infusion Indication: atrial fibrillation  Allergies  Allergen Reactions   Penicillins Hives and Swelling    As a child. Has patient had a PCN reaction causing immediate rash, facial/tongue/throat swelling, SOB or lightheadedness with hypotension: YES  Has patient had a PCN reaction causing severe rash involving mucus membranes or skin necrosis: UNKNOWN Has patient had a PCN reaction that required hospitalization: UNKNOWN Has patient had a PCN reaction occurring within the last 10 years: NO   Aspirin Nausea Only   Ibuprofen Nausea Only   Lyrica [Pregabalin] Nausea Only   Acetaminophen Nausea Only    Patient Measurements: Weight: 61.2 kg (135 lb) Heparin Dosing Weight: 64.3 kg  Vital Signs: Temp: 98 F (36.7 C) (06/03 0400) Temp Source: Oral (06/03 0400) BP: 121/83 (06/03 0400) Pulse Rate: 88 (06/03 0400)  Labs: Recent Labs    01/27/23 0534 01/27/23 1239 01/27/23 1906 01/28/23 0520 01/28/23 1237 01/28/23 1750 01/29/23 0524  HGB 15.7  --   --  14.0  --   --  13.9  HCT 45.8  --   --  41.6  --   --  42.6  PLT 303  --   --  248  --   --  236  APTT 81*   < >  --  114* 88* 83* 85*  HEPARINUNFRC  --   --   --  1.01*  --   --  0.67  CREATININE 0.91  --  1.00 0.82  --   --   --    < > = values in this interval not displayed.     Estimated Creatinine Clearance: 75.7 mL/min (by C-G formula based on SCr of 0.82 mg/dL).   Medical History: Past Medical History:  Diagnosis Date   Arthritis    Asthma    Benign neoplasm of ascending colon    polyps   Constipation    COPD (chronic obstructive pulmonary disease) (HCC)    NO INHALER   Diabetes mellitus without complication (HCC)    Type 2   Diverticulosis    Dyspnea    RARE-WITH EXERTION DUE TO COPD   GERD (gastroesophageal reflux disease)    Hepatitis C    History of kidney stones    H/O   Hyperlipidemia    Hypertension 06/10/2018   currently not under  control   Left inguinal hernia    Peripheral vascular disease (HCC)    Restless legs    Schizoaffective disorder (HCC)    Pt denies   Thrombocytopenia (HCC)    Vitamin D deficiency    Wears dentures    full upper and lower    Medications:  Apixaban 5 mg BID - last dose 5/31 @ 0952  Assessment: 67 y.o. male with a history of persistent afib/flutter s/p faild DCCV, PAD, CHF on apixaban, evaluated for Afib w/ RVR General cardiology team is planning for left and right heart catheterization on Monday 01/29/23. Pharmacy has been consulted to initiate and manage IV heparin therapy.     Goal of Therapy:  Heparin level 0.3-0.7 units/ml aPTT 66-102 seconds Monitor platelets by anticoagulation protocol: Yes   06/01 0534 aPTT 81, therapeutic x 1 06/01 1239 aPTT 83,  therapeutic x1  cont at 1100 u/hr 06/02 0520 aPTT 114, supratherapeutic / HL 1.01  06/02 1237 aPTT 88, Therapeutic x1 06/02   1757 aPTT 83, therapeutic x 2 06/03 0524 aPTT 85, therapeutic x 3 / HL 0.67  Plan:  Continue heparin infusion at 1000 units/hr Check aPTT daily w/ AM labs while therapeutic HL daily until correlation with aPTT confirmed, then change to follow HL Heart cath planned for Monday 6/3 Continue to monitor H&H and platelets  Otelia Sergeant, PharmD, [facility] 01/29/2023 6:37 AM

## 2023-01-29 NOTE — Plan of Care (Signed)
  Problem: Education: Goal: Knowledge of disease or condition will improve Outcome: Progressing Goal: Knowledge of the prescribed therapeutic regimen will improve Outcome: Progressing Goal: Individualized Educational Video(s) Outcome: Progressing   Problem: Activity: Goal: Ability to tolerate increased activity will improve Outcome: Progressing Goal: Will verbalize the importance of balancing activity with adequate rest periods Outcome: Progressing   Problem: Respiratory: Goal: Ability to maintain a clear airway will improve Outcome: Progressing Goal: Levels of oxygenation will improve Outcome: Progressing Goal: Ability to maintain adequate ventilation will improve Outcome: Progressing   Problem: Education: Goal: Ability to describe self-care measures that may prevent or decrease complications (Diabetes Survival Skills Education) will improve Outcome: Progressing Goal: Individualized Educational Video(s) Outcome: Progressing   Problem: Coping: Goal: Ability to adjust to condition or change in health will improve Outcome: Progressing   Problem: Fluid Volume: Goal: Ability to maintain a balanced intake and output will improve Outcome: Progressing   Problem: Health Behavior/Discharge Planning: Goal: Ability to identify and utilize available resources and services will improve Outcome: Progressing Goal: Ability to manage health-related needs will improve Outcome: Progressing   Problem: Metabolic: Goal: Ability to maintain appropriate glucose levels will improve Outcome: Progressing   Problem: Nutritional: Goal: Maintenance of adequate nutrition will improve Outcome: Progressing Goal: Progress toward achieving an optimal weight will improve Outcome: Progressing   Problem: Skin Integrity: Goal: Risk for impaired skin integrity will decrease Outcome: Progressing   Problem: Tissue Perfusion: Goal: Adequacy of tissue perfusion will improve Outcome: Progressing    Problem: Education: Goal: Knowledge of General Education information will improve Description: Including pain rating scale, medication(s)/side effects and non-pharmacologic comfort measures Outcome: Progressing   Problem: Health Behavior/Discharge Planning: Goal: Ability to manage health-related needs will improve Outcome: Progressing   Problem: Clinical Measurements: Goal: Ability to maintain clinical measurements within normal limits will improve Outcome: Progressing Goal: Will remain free from infection Outcome: Progressing Goal: Diagnostic test results will improve Outcome: Progressing Goal: Respiratory complications will improve Outcome: Progressing Goal: Cardiovascular complication will be avoided Outcome: Progressing   Problem: Activity: Goal: Risk for activity intolerance will decrease Outcome: Progressing   Problem: Nutrition: Goal: Adequate nutrition will be maintained Outcome: Progressing   Problem: Coping: Goal: Level of anxiety will decrease Outcome: Progressing   Problem: Elimination: Goal: Will not experience complications related to bowel motility Outcome: Progressing Goal: Will not experience complications related to urinary retention Outcome: Progressing   Problem: Pain Managment: Goal: General experience of comfort will improve Outcome: Progressing   Problem: Safety: Goal: Ability to remain free from injury will improve Outcome: Progressing   Problem: Skin Integrity: Goal: Risk for impaired skin integrity will decrease Outcome: Progressing   Problem: Education: Goal: Understanding of CV disease, CV risk reduction, and recovery process will improve Outcome: Progressing Goal: Individualized Educational Video(s) Outcome: Progressing   Problem: Activity: Goal: Ability to return to baseline activity level will improve Outcome: Progressing   Problem: Cardiovascular: Goal: Ability to achieve and maintain adequate cardiovascular perfusion  will improve Outcome: Progressing Goal: Vascular access site(s) Level 0-1 will be maintained Outcome: Progressing   Problem: Health Behavior/Discharge Planning: Goal: Ability to safely manage health-related needs after discharge will improve Outcome: Progressing   

## 2023-01-30 ENCOUNTER — Encounter: Payer: Self-pay | Admitting: Cardiovascular Disease

## 2023-01-30 DIAGNOSIS — I4892 Unspecified atrial flutter: Secondary | ICD-10-CM | POA: Diagnosis not present

## 2023-01-30 DIAGNOSIS — I5020 Unspecified systolic (congestive) heart failure: Secondary | ICD-10-CM | POA: Diagnosis not present

## 2023-01-30 DIAGNOSIS — J441 Chronic obstructive pulmonary disease with (acute) exacerbation: Secondary | ICD-10-CM | POA: Diagnosis not present

## 2023-01-30 DIAGNOSIS — J9601 Acute respiratory failure with hypoxia: Secondary | ICD-10-CM | POA: Diagnosis not present

## 2023-01-30 LAB — CBC
HCT: 46.1 % (ref 39.0–52.0)
Hemoglobin: 15.3 g/dL (ref 13.0–17.0)
MCH: 30.3 pg (ref 26.0–34.0)
MCHC: 33.2 g/dL (ref 30.0–36.0)
MCV: 91.3 fL (ref 80.0–100.0)
Platelets: 223 10*3/uL (ref 150–400)
RBC: 5.05 MIL/uL (ref 4.22–5.81)
RDW: 13.9 % (ref 11.5–15.5)
WBC: 8.2 10*3/uL (ref 4.0–10.5)
nRBC: 0 % (ref 0.0–0.2)

## 2023-01-30 LAB — BASIC METABOLIC PANEL
Anion gap: 8 (ref 5–15)
BUN: 32 mg/dL — ABNORMAL HIGH (ref 8–23)
CO2: 29 mmol/L (ref 22–32)
Calcium: 8.6 mg/dL — ABNORMAL LOW (ref 8.9–10.3)
Chloride: 98 mmol/L (ref 98–111)
Creatinine, Ser: 0.74 mg/dL (ref 0.61–1.24)
GFR, Estimated: >60 mL/min (ref >60)
Glucose, Bld: 145 mg/dL — ABNORMAL HIGH (ref 70–99)
Potassium: 4.2 mmol/L (ref 3.5–5.1)
Sodium: 135 mmol/L (ref 135–145)

## 2023-01-30 LAB — GLUCOSE, CAPILLARY
Glucose-Capillary: 164 mg/dL — ABNORMAL HIGH (ref 70–99)
Glucose-Capillary: 119 mg/dL — ABNORMAL HIGH (ref 70–99)
Glucose-Capillary: 164 mg/dL — ABNORMAL HIGH (ref 70–99)
Glucose-Capillary: 164 mg/dL — ABNORMAL HIGH (ref 70–99)

## 2023-01-30 LAB — APTT: aPTT: 72 seconds — ABNORMAL HIGH (ref 24–36)

## 2023-01-30 LAB — HEPARIN LEVEL (UNFRACTIONATED): Heparin Unfractionated: 0.47 IU/mL (ref 0.30–0.70)

## 2023-01-30 MED ORDER — LOSARTAN POTASSIUM 25 MG PO TABS
25.0000 mg | ORAL_TABLET | Freq: Every day | ORAL | Status: DC
  Administered 2023-01-30 – 2023-01-31 (×2): 25 mg via ORAL
  Filled 2023-01-30 (×2): qty 1

## 2023-01-30 MED ORDER — DIGOXIN 250 MCG PO TABS
0.2500 mg | ORAL_TABLET | Freq: Every day | ORAL | Status: DC
  Administered 2023-01-31: 0.25 mg via ORAL
  Filled 2023-01-30: qty 1

## 2023-01-30 MED ORDER — DIGOXIN 0.25 MG/ML IJ SOLN
0.5000 mg | Freq: Once | INTRAMUSCULAR | Status: AC
  Administered 2023-01-30: 0.5 mg via INTRAVENOUS
  Filled 2023-01-30: qty 2

## 2023-01-30 MED ORDER — APIXABAN 5 MG PO TABS
5.0000 mg | ORAL_TABLET | Freq: Two times a day (BID) | ORAL | Status: DC
  Administered 2023-01-30 – 2023-01-31 (×3): 5 mg via ORAL
  Filled 2023-01-30 (×3): qty 1

## 2023-01-30 NOTE — Consult Note (Signed)
ANTICOAGULATION CONSULT NOTE -   Pharmacy Consult for heparin infusion Indication: atrial fibrillation  Allergies  Allergen Reactions   Penicillins Hives and Swelling    As a child. Has patient had a PCN reaction causing immediate rash, facial/tongue/throat swelling, SOB or lightheadedness with hypotension: YES  Has patient had a PCN reaction causing severe rash involving mucus membranes or skin necrosis: UNKNOWN Has patient had a PCN reaction that required hospitalization: UNKNOWN Has patient had a PCN reaction occurring within the last 10 years: NO   Aspirin Nausea Only   Ibuprofen Nausea Only   Lyrica [Pregabalin] Nausea Only   Acetaminophen Nausea Only    Patient Measurements: Weight: 61.2 kg (135 lb) Heparin Dosing Weight: 64.3 kg  Vital Signs: Temp: 97.9 F (36.6 C) (06/04 0406) Temp Source: Oral (06/04 0406) BP: 118/82 (06/04 0406) Pulse Rate: 99 (06/04 0406)  Labs: Recent Labs    01/27/23 1239 01/27/23 1906 01/28/23 0520 01/28/23 1237 01/28/23 1750 01/29/23 0524 01/29/23 0945 01/29/23 0948 01/30/23 0539  HGB   < >  --  14.0  --   --  13.9 13.9 14.3 15.3  HCT   < >  --  41.6  --   --  42.6 41.0 42.0 46.1  PLT  --   --  248  --   --  236  --   --  223  APTT  --   --  114*   < > 83* 85*  --   --  72*  HEPARINUNFRC  --   --  1.01*  --   --  0.67  --   --  0.47  CREATININE  --  1.00 0.82  --   --   --   --   --  0.74   < > = values in this interval not displayed.     Estimated Creatinine Clearance: 77.6 mL/min (by C-G formula based on SCr of 0.74 mg/dL).   Medical History: Past Medical History:  Diagnosis Date   Arthritis    Asthma    Benign neoplasm of ascending colon    polyps   Constipation    COPD (chronic obstructive pulmonary disease) (HCC)    NO INHALER   Diabetes mellitus without complication (HCC)    Type 2   Diverticulosis    Dyspnea    RARE-WITH EXERTION DUE TO COPD   GERD (gastroesophageal reflux disease)    Hepatitis C    History  of kidney stones    H/O   Hyperlipidemia    Hypertension 06/10/2018   currently not under control   Left inguinal hernia    Peripheral vascular disease (HCC)    Restless legs    Schizoaffective disorder (HCC)    Pt denies   Thrombocytopenia (HCC)    Vitamin D deficiency    Wears dentures    full upper and lower    Medications:  Apixaban 5 mg BID - last dose 5/31 @ 0952  Assessment: 67 y.o. male with a history of persistent afib/flutter s/p faild DCCV, PAD, CHF on apixaban, evaluated for Afib w/ RVR General cardiology team is planning for left and right heart catheterization on Monday 01/29/23. Pharmacy has been consulted to initiate and manage IV heparin therapy.     Goal of Therapy:  Heparin level 0.3-0.7 units/ml aPTT 66-102 seconds Monitor platelets by anticoagulation protocol: Yes   06/01 0534 aPTT 81, therapeutic x 1 06/01 1239 aPTT 83,  therapeutic x1  cont at 1100 u/hr 06/02 0520  aPTT 114, supratherapeutic / HL 1.01  06/02 1237 aPTT 88, Therapeutic x1 06/02   1757 aPTT 83, therapeutic x 2 06/03 0524 aPTT 85, therapeutic x 3 / HL 0.67 06/04   0539 aPTT 72, HL 0.47,  therapeutic X 4   Plan:  6/4 @ 0539:  aPTT 72, HL 0.74 aPTT therapeutic X 4 , now correlating with HL  - Will use HL to guide dosing from here on - Will continue pt on current rate and recheck HL on 6/5 with AM labs.  Heart cath planned for Monday 6/3 Continue to monitor H&H and platelets  Daysen Gundrum D 01/30/2023 6:37 AM

## 2023-01-30 NOTE — Care Management Important Message (Signed)
Important Message  Patient Details  Name: RANEN BALAN MRN: [id number] Date of Birth: [birth year]   Medicare Important Message Given:  Yes  Reviewed Medicare IM with patient via room phone [phone]). Confirmed patient received copy of Medicare IM to reference.   Johnell Comings 01/30/2023, 11:20 AM

## 2023-01-30 NOTE — Progress Notes (Signed)
Progress Note    Mark Nash  ZOX:[id number] DOB: March 16, 1956  DOA: 01/21/2023 PCP: Mark Cory, MD      Brief Narrative:    Medical records reviewed and are as summarized below:  Mark Nash is a 67 y.o. male  with medical history significant of  hypertension,HLD, type 2 diabetes, PAD, COPD/asthma not O2 dependent, atrial fibrillation on chronic anticoagulation with apixaban, CHFef 40-45%, hepc C, schizoaffective/schizophrenia disorder, tobacco use, who has interim history of visit to ED on 5/18 with similar complaints of shortness of breath.  He was diagnosed with COPD exacerbation and discharge to home prednisone and doxycycline at that time.  He presented to the hospital again on 01/21/2023 with cough, congestion and shortness of breath.  He also reported some dark stools about 3 days prior to admission.   He was admitted to the hospital for COPD exacerbation.  He was treated with steroids, bronchodilators and empiric antibiotics.  Respiratory viral panel was positive for rhinovirus which probably triggered COPD exacerbation.    Assessment/Plan:   Principal Problem:   COPD exacerbation (HCC) Active Problems:   Paranoid schizophrenia (HCC)   Peripheral vascular disease (HCC)   Type 2 diabetes mellitus with diabetic polyneuropathy, with long-term current use of insulin (HCC)   Rhinovirus infection   Acute respiratory failure with hypoxia (HCC)   Chronic atrial flutter (HCC)   Acute systolic heart failure (HCC)   Acute on chronic combined systolic and diastolic CHF (congestive heart failure) (HCC)   Pressure injury of skin   COPD exacerbation / Rhinovirus infection  Respiratory viral panel was positive for rhinovirus.   Continue prednisone and bronchodilators.   Completed 3 days of azithromycin on 01/24/2023.   --Scheduled and PRN bronchodilators --Initially completed 5 days steroids  --Resumed prednisone 40 mg due to recurrent wheezing.  --Continue  tapering off Prednisone - on 20 mg daily --Stopped Rocephin -- normal respiratory flora on sputum cx  --Flutter valve as needed.   --On room air, not hypoxic - monitor closely. --Droplet precautions. --Symptomatic/supportive care  Acute respirtatory failure with hypoxia -- resolved. Per EDP documentation, arrived hypoxic with sats in 80's on room air.  Now on room air. Mgmt as above.  Supplement o2 if needed to keep sats > 88%  Paroxysmal atrial fibrillation/atrial flutter with RVR: Heart rate was better, but today was 100's to 120 on monitor during rounds.  Afternoon today, HR 140's.  Initially on Cardizem drip 6/4: HR's at rest 110-120 on monitor, pt seated in bed --Consult Cardiology --Cardizem was d/c'd given depressed EF --Cardiology adding digoxin today --Toprol-XL 200 mg daily --Resume Eliquis & dc heparin. --Telemetry  Hyponatremia -- Resolved.  Na trended down, 129 (6/1), improved to 133 with gentle IV fluids. --appears hypovolemic - responded well to fluids --off fluids since evening 6/2 --monitor volume status closely  Recent black stools: No acute overt bleeding at this time.   H&H is stable.  Resumed Eliquis.  Discontinue IV Protonix.   Monitor CBC   Chronic systolic CHF: Compensated.   2D echo in January 2024 showed EF estimated at 40 to 45%. Repeat echo this admission, EF 30-35% --Cardiology following --R/L cath 6/3 -- mild non-obstructive CAD, normal filling pressures --On Toprol-XL, losartan  Type II DM with hyperglycemia:  --Continue insulin glargine and Farxiga.   --Levemir 8 units qHS --Added NovoLog 4 units 3 times daily with meals,  --Also on sliding scale  Constipation -- bowel regimen per orders.   Dyslipidemia -- on  pravastatin  Other comorbidities include hypertension, PVD, hyperlipidemia, schizoaffective disorder, history of hepatitis C, lumbar degenerative disc disease    Consultants: Cardiology  Procedures: R/L heart cath 6/3 -- see  report.  Nonobstructive CAD. Normal filling pressures.     Family Communication/Anticipated D/C date and plan/Code Status   DVT prophylaxis: SCDs Start: 01/21/23 0909 apixaban (ELIQUIS) tablet 5 mg     Code Status: Full Code  Family Communication: None  Disposition Plan: Home when further improved, HR's controlled, cleared by cardiology   Status is: Inpatient Remains inpatient appropriate because: COPD exacerbation, A-fib/flutter RVR with ongoing evaluation per cardiology.    Subjective:   Pt reports feeling okay today.  Breathing is better but not great, feels close to baseline though.  HR's are up again today, 110s-120 on monitor during encounter.  Objective:    Vitals:   01/30/23 0406 01/30/23 0735 01/30/23 0909 01/30/23 1155  BP: 118/82 (!) 143/81  123/80  Pulse: 99 (!) 54 60 (!) 55  Resp: 18 (!) 21  (!) 22  Temp: 97.9 F (36.6 C) 97.8 F (36.6 C)  97.6 F (36.4 C)  TempSrc: Oral Oral  Oral  SpO2: 98% 98%  93%  Weight:       No data found.   Intake/Output Summary (Last 24 hours) at 01/30/2023 1302 Last data filed at 01/30/2023 1155 Gross per 24 hour  Intake 418.7 ml  Output 2800 ml  Net -2381.3 ml   Filed Weights   01/26/23 0500 01/27/23 0337 01/28/23 0427  Weight: 64.3 kg 61.1 kg 61.2 kg    Exam:  General exam: awake, alert, no acute distress HEENT: moist mucus membranes, hearing grossly normal  Respiratory system: normal respiratory effort. On room air, clear no wheezes Cardiovascular system: irregularly irregular, no pedal edema.   Central nervous system: A&O x 3. no gross focal neurologic deficits, normal speech Extremities: moves all, no edema, normal tone Skin: dry, intact, normal temperature Psychiatry: normal mood, congruent affect     Data Reviewed:   Notable labs --- glucose 145, BUN 32, Ca 8.6 otherwise normal BMP, normal CBC   Micro --  Sputum culture grew normal respiratory flora, no MRSA or pseudomonas. Final.    LOS: 9  days   Pennie Banter, DO  [facility]   Pager on [email or web address]. If 7PM-7AM, please contact night-coverage at [email or web address]     01/30/2023, 1:02 PM

## 2023-01-30 NOTE — Progress Notes (Signed)
Rounding Note    Patient Name: Mark Nash Date of Encounter: 01/30/2023  [facility] Cardiologist: Julien Nordmann, MD   Subjective   Patient feels close to baseline without chest pain or significant dyspnea.  No palpitations reported.  Tolerated catheterization yesterday well, which showed mild, nonobstructive CAD and normal left and right heart filling pressures.  Inpatient Medications    Scheduled Meds:  apixaban  5 mg Oral BID   dapagliflozin propanediol  10 mg Oral Daily   [START ON 01/31/2023] digoxin  0.25 mg Oral Daily   fluticasone furoate-vilanterol  1 puff Inhalation Daily   And   umeclidinium bromide  1 puff Inhalation Daily   gabapentin  300 mg Oral q AM   And   gabapentin  300 mg Oral Q1200   And   gabapentin  600 mg Oral QHS   insulin aspart  0-9 Units Subcutaneous TID WC   insulin aspart  4 Units Subcutaneous TID WC   insulin detemir  8 Units Subcutaneous QHS   losartan  25 mg Oral Daily   metoprolol succinate  200 mg Oral Daily   polyethylene glycol  34 g Oral Daily   pravastatin  20 mg Oral QHS   predniSONE  20 mg Oral Q breakfast   sodium chloride flush  3 mL Intravenous Q12H   sodium chloride flush  3 mL Intravenous Q12H   traMADol  50 mg Oral Q12H   Continuous Infusions:  sodium chloride     PRN Meds: sodium chloride, bisacodyl, bisacodyl, guaiFENesin, ipratropium-albuterol, levalbuterol, magnesium citrate, ondansetron **OR** ondansetron (ZOFRAN) IV, sodium chloride flush   Vital Signs    Vitals:   01/30/23 0406 01/30/23 0735 01/30/23 0909 01/30/23 1155  BP: 118/82 (!) 143/81  123/80  Pulse: 99 (!) 54 60 (!) 55  Resp: 18 (!) 21  (!) 22  Temp: 97.9 F (36.6 C) 97.8 F (36.6 C)  97.6 F (36.4 C)  TempSrc: Oral Oral  Oral  SpO2: 98% 98%  93%  Weight:        Intake/Output Summary (Last 24 hours) at 01/30/2023 1323 Last data filed at 01/30/2023 1155 Gross per 24 hour  Intake 418.7 ml  Output 2525 ml  Net -2106.3 ml       01/28/2023    4:27 AM 01/27/2023    3:37 AM 01/26/2023    5:00 AM  Last 3 Weights  Weight (lbs) 135 lb 134 lb 9.6 oz 141 lb 12.1 oz  Weight (kg) 61.236 kg 61.054 kg 64.3 kg      Telemetry    Atrial flutter with variable block, with resting heart rates mostly in the low 100s though spiking with minimal activity and sometimes at rest up to 150 bpm - Personally Reviewed  ECG    Formed yesterday at 7:49 AM, demonstrating atrial flutter with variable AV block and PVCs versus aberrancy. - Personally Reviewed  Physical Exam   GEN: No acute distress.   Neck: No JVD Cardiac: Tachycardic and irregular rhythm. Respiratory: Diminished breath once bilaterally with faint rhonchi.  No wheezes or crackles. GI: Soft, nontender, non-distended  MS: No edema; No deformity. Neuro:  Nonfocal  Psych: Normal affect   Labs    High Sensitivity Troponin:   Recent Labs  Lab 01/13/23 0255 01/13/23 0431  TROPONINIHS 19* 24*     Chemistry Recent Labs  Lab 01/26/23 0545 01/27/23 0534 01/27/23 1906 01/28/23 0520 01/29/23 0945 01/29/23 0948 01/30/23 0539  NA 132*   < >  132* 133* 134* 135 135  K 5.0   < > 5.3* 4.6 4.2 4.2 4.2  CL 96*   < > 96* 99  --   --  98  CO2 27   < > 27 28  --   --  29  GLUCOSE 216*   < > 177* 111*  --   --  145*  BUN 32*   < > 48* 36*  --   --  32*  CREATININE 0.84   < > 1.00 0.82  --   --  0.74  CALCIUM 9.2   < > 8.8* 8.4*  --   --  8.6*  MG 2.4  --   --   --   --   --   --   GFRNONAA >60   < > >60 >60  --   --  >60  ANIONGAP 9   < > 9 6  --   --  8   < > = values in this interval not displayed.    Lipids No results for input(s): "CHOL", "TRIG", "HDL", "LABVLDL", "LDLCALC", "CHOLHDL" in the last 168 hours.  Hematology Recent Labs  Lab 01/28/23 0520 01/29/23 0524 01/29/23 0945 01/29/23 0948 01/30/23 0539  WBC 12.1* 12.2*  --   --  8.2  RBC 4.57 4.61  --   --  5.05  HGB 14.0 13.9 13.9 14.3 15.3  HCT 41.6 42.6 41.0 42.0 46.1  MCV 91.0 92.4  --   --  91.3  MCH  30.6 30.2  --   --  30.3  MCHC 33.7 32.6  --   --  33.2  RDW 13.6 13.5  --   --  13.9  PLT 248 236  --   --  223   Thyroid  Recent Labs  Lab 01/27/23 1239  TSH 1.455    BNPNo results for input(s): "BNP", "PROBNP" in the last 168 hours.  DDimer No results for input(s): "DDIMER" in the last 168 hours.   Radiology    CARDIAC CATHETERIZATION  Result Date: 01/29/2023 1.  Mildly calcified coronary arteries with mild nonobstructive coronary artery disease. 2.  Left ventricular angiography was not performed.  EF was moderately reduced by echo. 3.  Right heart catheterization showed normal filling pressures, mild pulmonary hypertension and normal cardiac output. Recommendations: The patient has nonischemic cardiomyopathy likely tachycardia induced.  Recommend medical therapy. Continue Toprol for anticoagulation. Resume heparin 2 hours after sheath pull and likely can transition to Eliquis later this evening.    Cardiac Studies   See catheterization above.  Limited echo (01/26/2023):  1. Left ventricular ejection fraction, by estimation, is 30 to 35%. The  left ventricle has moderately decreased function. Left ventricular  endocardial border not optimally defined to evaluate regional wall motion.   2. Right ventricular systolic function is normal. The right ventricular  size is normal.   3. Left atrial size was moderately dilated.   4. The mitral valve is normal in structure. No evidence of mitral valve  regurgitation. No evidence of mitral stenosis.   5. The aortic valve is calcified. Aortic valve regurgitation is not  visualized. No aortic stenosis is present.   6. Aortic dilatation noted. There is mild dilatation of the aortic root,  measuring 41 mm.   7. Challenging image quality. Most valves not well seen.   Patient Profile     66 y.o. male with a hx of HTN, HLD, DM2, PAD, COPD/asthma not O2 dependent, persistent afib/flutter on  Eliquis, HFmrEF, hep C, schizoaffective/schizophrenia  disorder, tobacco use who is being seen 01/25/2023 for the evaluation of rapid afib/flutter and newly worsening LVEF.  Assessment & Plan    Atrial flutter: Ventricular rates remain suboptimally controlled albeit without significant symptoms.  I worry that declining LVEF could be related to this.  He is already on maximum dose of metoprolol succinate.  Calcium channel blockers like diltiazem are not a good option for Mr. Yokel in the setting of his HFrEF. -Continue metoprolol succinate 200 mg daily. Load with digoxin 500 mcg x 1 followed by 250 mcg daily thereafter.  Check digoxin level tomorrow morning. -Consider reconsulting EP for further rate control options if ventricular rates remain difficult to control.  Patient previously failed cardioversion and would likely not have a durable response to repeat cardioversion.  HFrEF: LVEF dropped from 45-50% in January to 30-35% this admission.  Mr. Greim appears euvolemic and was noted to have normal filling pressures yesterday.  No significant CAD identified.  Suspect tachycardia induced cardiomyopathy may be playing a role. -Continue metoprolol succinate, as above. -Add digoxin for rate control and inotropic support. -Continue losartan 25 mg daily.  Consider transitioning to [facility] this admission or as an outpatient as blood pressure and renal function allow.  Acute respite failure with hypoxia, COPD exacerbation, and rhinovirus: Patient reports his breathing is close to baseline. -Ongoing management per primary team.  For questions or updates, please contact [facility] Please consult [email or web address] for contact info under [facility].    Signed, Yvonne Kendall, MD  01/30/2023, 1:23 PM

## 2023-01-31 DIAGNOSIS — B348 Other viral infections of unspecified site: Secondary | ICD-10-CM

## 2023-01-31 DIAGNOSIS — J441 Chronic obstructive pulmonary disease with (acute) exacerbation: Secondary | ICD-10-CM | POA: Diagnosis not present

## 2023-01-31 DIAGNOSIS — I4892 Unspecified atrial flutter: Secondary | ICD-10-CM | POA: Diagnosis not present

## 2023-01-31 DIAGNOSIS — I739 Peripheral vascular disease, unspecified: Secondary | ICD-10-CM

## 2023-01-31 DIAGNOSIS — F200 Paranoid schizophrenia: Secondary | ICD-10-CM

## 2023-01-31 DIAGNOSIS — J9601 Acute respiratory failure with hypoxia: Secondary | ICD-10-CM | POA: Diagnosis not present

## 2023-01-31 LAB — MAGNESIUM: Magnesium: 2.5 mg/dL — ABNORMAL HIGH (ref 1.7–2.4)

## 2023-01-31 LAB — BASIC METABOLIC PANEL
Anion gap: 8 (ref 5–15)
BUN: 31 mg/dL — ABNORMAL HIGH (ref 8–23)
CO2: 31 mmol/L (ref 22–32)
Calcium: 8.8 mg/dL — ABNORMAL LOW (ref 8.9–10.3)
Chloride: 98 mmol/L (ref 98–111)
Creatinine, Ser: 0.91 mg/dL (ref 0.61–1.24)
GFR, Estimated: >60 mL/min (ref >60)
Glucose, Bld: 117 mg/dL — ABNORMAL HIGH (ref 70–99)
Potassium: 4.4 mmol/L (ref 3.5–5.1)
Sodium: 137 mmol/L (ref 135–145)

## 2023-01-31 LAB — LIPOPROTEIN A (LPA): Lipoprotein (a): 31.9 nmol/L — ABNORMAL HIGH (ref <75.0)

## 2023-01-31 LAB — GLUCOSE, CAPILLARY
Glucose-Capillary: 161 mg/dL — ABNORMAL HIGH (ref 70–99)
Glucose-Capillary: 95 mg/dL (ref 70–99)
Glucose-Capillary: 188 mg/dL — ABNORMAL HIGH (ref 70–99)

## 2023-01-31 LAB — PHOSPHORUS: Phosphorus: 4.5 mg/dL (ref 2.5–4.6)

## 2023-01-31 LAB — DIGOXIN LEVEL: Digoxin Level: 0.3 ng/mL — ABNORMAL LOW (ref 0.8–2.0)

## 2023-01-31 MED ORDER — DIGOXIN 250 MCG PO TABS: 0.2500 mg | ORAL_TABLET | Freq: Every day | ORAL | 5 refills | Status: DC

## 2023-01-31 MED ORDER — METOPROLOL SUCCINATE ER 200 MG PO TB24: 200.0000 mg | ORAL_TABLET | Freq: Every day | ORAL | 11 refills | Status: DC

## 2023-01-31 MED ORDER — LOSARTAN POTASSIUM 25 MG PO TABS
25.0000 mg | ORAL_TABLET | Freq: Every day | ORAL | 5 refills | Status: DC
Start: 2023-01-31 — End: 2023-02-27

## 2023-01-31 NOTE — Progress Notes (Addendum)
Rounding Note    Patient Name: Mark Nash Date of Encounter: 01/31/2023  [facility] Cardiologist: Julien Nordmann, MD   Subjective   Diagnosed with rhinovirus, COPD exacerbation No complaints this morning, feels breathing is stable Telemetry reviewed, atrial flutter heart rate in the 90s Taken himself off oxygen Reports still with cough, minimal sputum production  Inpatient Medications    Scheduled Meds:  apixaban  5 mg Oral BID   dapagliflozin propanediol  10 mg Oral Daily   digoxin  0.25 mg Oral Daily   fluticasone furoate-vilanterol  1 puff Inhalation Daily   And   umeclidinium bromide  1 puff Inhalation Daily   gabapentin  300 mg Oral q AM   And   gabapentin  300 mg Oral Q1200   And   gabapentin  600 mg Oral QHS   insulin aspart  0-9 Units Subcutaneous TID WC   insulin aspart  4 Units Subcutaneous TID WC   insulin detemir  8 Units Subcutaneous QHS   losartan  25 mg Oral Daily   metoprolol succinate  200 mg Oral Daily   polyethylene glycol  34 g Oral Daily   pravastatin  20 mg Oral QHS   predniSONE  20 mg Oral Q breakfast   sodium chloride flush  3 mL Intravenous Q12H   sodium chloride flush  3 mL Intravenous Q12H   traMADol  50 mg Oral Q12H   Continuous Infusions:  sodium chloride     PRN Meds: sodium chloride, bisacodyl, bisacodyl, guaiFENesin, ipratropium-albuterol, levalbuterol, magnesium citrate, ondansetron **OR** ondansetron (ZOFRAN) IV, sodium chloride flush   Vital Signs    Vitals:   01/31/23 0433 01/31/23 0523 01/31/23 0727 01/31/23 1116  BP: 115/60  116/64 114/60  Pulse: 94  91 92  Resp: 19  18 20   Temp: 97.7 F (36.5 C)  97.6 F (36.4 C) (!) 97.5 F (36.4 C)  TempSrc: Oral  Oral Oral  SpO2: 96%  96% 95%  Weight:  59.4 kg      Intake/Output Summary (Last 24 hours) at 01/31/2023 1343 Last data filed at 01/31/2023 1118 Gross per 24 hour  Intake 5 ml  Output 2275 ml  Net -2270 ml      01/31/2023    5:23 AM 01/28/2023     4:27 AM 01/27/2023    3:37 AM  Last 3 Weights  Weight (lbs) 130 lb 14.4 oz 135 lb 134 lb 9.6 oz  Weight (kg) 59.376 kg 61.236 kg 61.054 kg      Telemetry    Atrial flutter rate in the 90s- Personally Reviewed  ECG     - Personally Reviewed  Physical Exam   GEN: No acute distress.   Neck: No JVD Cardiac: Irregularly irregular, no murmurs, rubs, or gallops.  Respiratory: Clear to auscultation bilaterally. GI: Soft, nontender, non-distended  MS: No edema; No deformity. Neuro:  Nonfocal  Psych: Normal affect   Labs    High Sensitivity Troponin:   Recent Labs  Lab 01/13/23 0255 01/13/23 0431  TROPONINIHS 19* 24*     Chemistry Recent Labs  Lab 01/26/23 0545 01/27/23 0534 01/28/23 0520 01/29/23 0945 01/29/23 0948 01/30/23 0539 01/31/23 1018  NA 132*   < > 133*   < > 135 135 137  K 5.0   < > 4.6   < > 4.2 4.2 4.4  CL 96*   < > 99  --   --  98 98  CO2 27   < > 28  --   --  29 31  GLUCOSE 216*   < > 111*  --   --  145* 117*  BUN 32*   < > 36*  --   --  32* 31*  CREATININE 0.84   < > 0.82  --   --  0.74 0.91  CALCIUM 9.2   < > 8.4*  --   --  8.6* 8.8*  MG 2.4  --   --   --   --   --  2.5*  GFRNONAA >60   < > >60  --   --  >60 >60  ANIONGAP 9   < > 6  --   --  8 8   < > = values in this interval not displayed.    Lipids No results for input(s): "CHOL", "TRIG", "HDL", "LABVLDL", "LDLCALC", "CHOLHDL" in the last 168 hours.  Hematology Recent Labs  Lab 01/28/23 0520 01/29/23 0524 01/29/23 0945 01/29/23 0948 01/30/23 0539  WBC 12.1* 12.2*  --   --  8.2  RBC 4.57 4.61  --   --  5.05  HGB 14.0 13.9 13.9 14.3 15.3  HCT 41.6 42.6 41.0 42.0 46.1  MCV 91.0 92.4  --   --  91.3  MCH 30.6 30.2  --   --  30.3  MCHC 33.7 32.6  --   --  33.2  RDW 13.6 13.5  --   --  13.9  PLT 248 236  --   --  223   Thyroid  Recent Labs  Lab 01/27/23 1239  TSH 1.455    BNPNo results for input(s): "BNP", "PROBNP" in the last 168 hours.  DDimer No results for input(s): "DDIMER" in  the last 168 hours.   Radiology    No results found.  Cardiac Studies  Echocardiogram Jan 26, 2023 Left ventricular ejection fraction, by estimation, is 30 to 35%. The  left ventricle has moderately decreased function. Left ventricular  endocardial border not optimally defined to evaluate regional wall motion.   2. Right ventricular systolic function is normal. The right ventricular  size is normal.   3. Left atrial size was moderately dilated.   4. The mitral valve is normal in structure. No evidence of mitral valve  regurgitation. No evidence of mitral stenosis.   5. The aortic valve is calcified. Aortic valve regurgitation is not  visualized. No aortic stenosis is present.   6. Aortic dilatation noted. There is mild dilatation of the aortic root,  measuring 41 mm.   7. Challenging image quality. Most valves not well seen.    Cardiac catheterization January 29, 2023 1.  Mildly calcified coronary arteries with mild nonobstructive coronary artery disease. 2.  Left ventricular angiography was not performed.  EF was moderately reduced by echo. 3.  Right heart catheterization showed normal filling pressures, mild pulmonary hypertension and normal cardiac output.   Recommendations: The patient has nonischemic cardiomyopathy likely tachycardia induced.  Patient Profile     67 y.o. male with a hx of HTN, HLD, DM2, PAD, COPD/asthma not O2 dependent, persistent afib/flutter on Eliquis, HFmrEF, hep C, schizoaffective/schizophrenia disorder, tobacco use who is being seen 01/25/2023 for the evaluation of rapid afib/flutter and newly worsening LVEF.   Assessment & Plan    Atrial flutter Rates improved on metoprolol succinate 200 daily, started on digoxin yesterday Rate in the 90s Notes indicating failed cardioversion in the past Not on calcium channel blocker in the setting of cardiomyopathy Eliquis 5 twice daily  Cardiomyopathy Ejection fraction previously 45  to 50% in January now down  to 30 to 35% this admission Nonischemic cardiomyopathy, catheterization done this admission Continue metoprolol succinate, digoxin Tolerating losartan 25 If blood pressure remains stable could consider transition from losartan to Entresto 24/26 mg twice daily  Acute hypoxic respiratory failure, COPD exacerbation In the setting of rhinovirus Slow improvement, off oxygen    Total encounter time more than 50 minutes  Greater than 50% was spent in counseling and coordination of care with the patient   For questions or updates, please contact [facility] Please consult [email or web address] for contact info under        Signed, Julien Nordmann, MD  01/31/2023, 1:43 PM

## 2023-01-31 NOTE — Discharge Summary (Signed)
[facility] Discharge Summary   Patient: Mark Nash WGN:[id number]  PCP: Alba Cory, MD  Date of admission: 01/21/2023   Date of discharge:  01/31/2023     Discharge Diagnoses:  Principal Problem:   COPD exacerbation (HCC) Active Problems:   Paranoid schizophrenia (HCC)   Peripheral vascular disease (HCC)   Type 2 diabetes mellitus with diabetic polyneuropathy, with long-term current use of insulin (HCC)   Rhinovirus infection   Acute respiratory failure with hypoxia (HCC)   Chronic atrial flutter (HCC)   Acute systolic heart failure (HCC)   Acute on chronic combined systolic and diastolic CHF (congestive heart failure) (HCC)   Pressure injury of skin   HFrEF (heart failure with reduced ejection fraction) (HCC)   Admitted From: Home Disposition:  Home  Recommendations for Outpatient Follow-up:  Follow with PCP in 1 week, continue to monitor BP and follow with PCP and cardiology to titrate medications accordingly. Follow-up with cardiology in 1 week Follow up LABS/TEST:     Diet recommendation: Cardiac and Carb modified diet  Activity: The patient is advised to gradually reintroduce usual activities, as tolerated  Discharge Condition: stable  Code Status: Full code   History of present illness: As per the H and P dictated on admission Hospital Course:  Mark Nash is a 67 y.o. male  with medical history significant of  hypertension,HLD, type 2 diabetes, PAD, COPD/asthma not O2 dependent, atrial fibrillation on chronic anticoagulation with apixaban, CHFef 40-45%, hepc C, schizoaffective/schizophrenia disorder, tobacco use, who has interim history of visit to ED on 5/18 with similar complaints of shortness of breath.  He was diagnosed with COPD exacerbation and discharge to home prednisone and doxycycline at that time.  He presented to the hospital again on 01/21/2023 with cough, congestion and shortness of breath.  He also reported some dark stools about 3 days  prior to admission. He was admitted to the hospital for COPD exacerbation.  He was treated with steroids, bronchodilators and empiric antibiotics.  Respiratory viral panel was positive for rhinovirus which probably triggered COPD exacerbation.  Assessment and plan Principal Problem:   COPD exacerbation (HCC) Active Problems:   Paranoid schizophrenia (HCC)   Peripheral vascular disease (HCC)   Type 2 diabetes mellitus with diabetic polyneuropathy, with long-term current use of insulin (HCC)   Rhinovirus infection   Acute respiratory failure with hypoxia (HCC)   Chronic atrial flutter (HCC)   Acute systolic heart failure (HCC)   Acute on chronic combined systolic and diastolic CHF (congestive heart failure) (HCC)   Pressure injury of skin     # COPD exacerbation / Rhinovirus infection  Respiratory viral panel was positive for rhinovirus.   S/p prednisone and bronchodilators. Completed 3 days of azithromycin on 01/24/2023.   Initially completed 5 days steroids, Resumed prednisone 40 mg due to recurrent wheezing.  Breathing improved, patient does not need tapering dose of steroids. Stopped Rocephin -- normal respiratory flora on sputum cx. S/p Flutter valve as needed.  Continue droplet precautions, patient remained stable, saturating well on room air. # Acute respirtatory failure with hypoxia -- resolved. Per EDP documentation, arrived hypoxic with sats in 80's on room air.  Now on room air. Mgmt as above.  Supplement o2 if needed to keep sats > 88% # Paroxysmal atrial fibrillation/atrial flutter with RVR:  S/p heparin IV infusion, resumed Eliquis.  Continued Toprol-XL 20 mg p.o. daily, started digoxin.  Discontinued Cardizem due to decreased EF.  Heart rate improved.  Patient was seen by  cardiologist and cleared to discharge home and follow-up as an outpatient. # Hyponatremia -- Resolved, Na 137 on discharge.  Follow with PCP. # Recent black stools: No acute overt bleeding at this time. H&H  is stable.  Resumed Eliquis.  Discontinue IV Protonix.   # Chronic systolic CHF: Compensated. 2D echo in January 2024 showed EF estimated at 40 to 45%. Repeat echo this admission, EF 30-35% Cardiology consulted, s/p R/L cath 6/3 -- mild non-obstructive CAD, normal filling pressures Continue Toprol-XL, losartan # Type II DM with hyperglycemia: Continue insulin glargine and Farxiga.  Resumed Levemir 8 units qHS home dose.  Patient was advised to continue diabetic diet, monitor CBG and follow with PCP. # Constipation -- bowel regimen per orders.  # Dyslipidemia -- on pravastatin Other comorbidities include hypertension, PVD, hyperlipidemia, schizoaffective disorder, history of hepatitis C, lumbar degenerative disc disease   Consultants: Cardiology Procedures: R/L heart cath 6/3 -- see report.  Nonobstructive CAD. Normal filling pressures.  Body mass index is 18.26 kg/m.  Nutrition Interventions:  Pressure Injury 01/26/23 Sacrum Mid Stage 2 -  Partial thickness loss of dermis presenting as a shallow open injury with a red, pink wound bed without slough. (Active)  01/26/23 0845  Location: Sacrum  Location Orientation: Mid  Staging: Stage 2 -  Partial thickness loss of dermis presenting as a shallow open injury with a red, pink wound bed without slough.  Wound Description (Comments):   Present on Admission: No  Dressing Type Foam - Lift dressing to assess site every shift 01/30/23 2015    - Patient was instructed, not to drive, operate heavy machinery, perform activities at heights, swimming or participation in water activities or provide baby sitting services while on Pain, Sleep and Anxiety Medications; until his outpatient Physician has advised to do so again.  - Also recommended to not to take more than prescribed Pain, Sleep and Anxiety Medications.  Patient was ambulatory without any assistance. On the day of the discharge the patient's vitals were stable, and no other acute medical  condition were reported by patient. the patient was felt safe to be discharge at Home with .  Consultants: Cardiologist Procedures: s/p R/L cath 6/3 -- mild non-obstructive CAD, normal filling pressures  Discharge Exam: General: Appear in no distress, no Rash; Oral Mucosa Clear, moist. Cardiovascular: S1 and S2 Present, no Murmur, Respiratory: normal respiratory effort, Bilateral Air entry present and no Crackles, no wheezes Abdomen: Bowel Sound present, Soft and no tenderness, no hernia Extremities: no Pedal edema, no calf tenderness Neurology: alert and oriented to time, place, and person affect appropriate.  Filed Weights   01/27/23 0337 01/28/23 0427 01/31/23 0523  Weight: 61.1 kg 61.2 kg 59.4 kg   Vitals:   01/31/23 1116 01/31/23 1612  BP: 114/60 114/65  Pulse: 92 (!) 45  Resp: 20 16  Temp: (!) 97.5 F (36.4 C) 97.6 F (36.4 C)  SpO2: 95% 97%    DISCHARGE MEDICATION: Allergies as of 01/31/2023       Reactions   Penicillins Hives, Swelling   As a child. Has patient had a PCN reaction causing immediate rash, facial/tongue/throat swelling, SOB or lightheadedness with hypotension: YES  Has patient had a PCN reaction causing severe rash involving mucus membranes or skin necrosis: UNKNOWN Has patient had a PCN reaction that required hospitalization: UNKNOWN Has patient had a PCN reaction occurring within the last 10 years: NO   Aspirin Nausea Only   Ibuprofen Nausea Only   Lyrica [pregabalin] Nausea Only  Acetaminophen Nausea Only        Medication List     STOP taking these medications    diltiazem 240 MG 24 hr capsule Commonly known as: Dilt-XR   potassium chloride 10 MEQ tablet Commonly known as: KLOR-CON   sulfamethoxazole-trimethoprim 800-160 MG tablet Commonly known as: BACTRIM DS       TAKE these medications    blood glucose meter kit and supplies Kit Dispense based on patient and insurance preference. Use up to tid E11.65, LON:99   Breztri  Aerosphere 160-9-4.8 MCG/ACT Aero Generic drug: Budeson-Glycopyrrol-Formoterol Inhale 2 puffs into the lungs 2 (two) times daily. Patient receives via [employer] Patient Assistance.   dapagliflozin propanediol 10 MG Tabs tablet Commonly known as: Farxiga Take 1 tablet (10 mg total) by mouth daily.   digoxin 0.25 MG tablet Commonly known as: LANOXIN Take 1 tablet (0.25 mg total) by mouth daily. Start taking on: February 01, 2023   Eliquis 5 MG Tabs tablet Generic drug: apixaban Take 1 tablet by mouth twice daily   gabapentin 300 MG capsule Commonly known as: NEURONTIN Take 300-600 mg by mouth See admin instructions. Take 1 capsule (300 mg) by mouth in the morning, take 1 capsule (300 mg) by mouth at noon, and 2 capsules (600 mg) by mouth in the evening   guaiFENesin 600 MG 12 hr tablet Commonly known as: MUCINEX Take 600 mg by mouth 2 (two) times daily as needed for to loosen phlegm.   Lancet Devices Misc 1 each by Does not apply route 3 (three) times daily as needed. What ins will cover dx:E11.65, LON:99 tid   Levemir 100 UNIT/ML injection Generic drug: insulin detemir INJECT 8 UNITS SUBCUTANEOUSLY AT BEDTIME What changed: See the new instructions.   losartan 25 MG tablet Commonly known as: Cozaar Take 1 tablet (25 mg total) by mouth daily. Skip the dose if systolic BP less than 110 mmHg What changed:  how much to take additional instructions   metoprolol 200 MG 24 hr tablet Commonly known as: TOPROL-XL Take 1 tablet (200 mg total) by mouth daily. Take with or immediately following a meal. Start taking on: February 01, 2023   pravastatin 20 MG tablet Commonly known as: PRAVACHOL Take 1 tablet (20 mg total) by mouth daily with supper.   tiZANidine 4 MG tablet Commonly known as: ZANAFLEX Take 4 mg by mouth every 12 (twelve) hours.   traMADol 50 MG tablet Commonly known as: ULTRAM Take 50 mg by mouth every 12 (twelve) hours.   Vitamin D (Ergocalciferol) 1.25 MG (50000 UNIT)  Caps capsule Commonly known as: DRISDOL TAKE 1 CAPSULE BY MOUTH EVERY TWO WEEKS               Discharge Care Instructions  (From admission, onward)           Start     Ordered   01/31/23 0000  Discharge wound care:       Comments: Wet-to-dry dressing daily.  Avoid pressure, use donut cushion   01/31/23 1703           Allergies  Allergen Reactions   Penicillins Hives and Swelling    As a child. Has patient had a PCN reaction causing immediate rash, facial/tongue/throat swelling, SOB or lightheadedness with hypotension: YES  Has patient had a PCN reaction causing severe rash involving mucus membranes or skin necrosis: UNKNOWN Has patient had a PCN reaction that required hospitalization: UNKNOWN Has patient had a PCN reaction occurring within the last 10 years:  NO   Aspirin Nausea Only   Ibuprofen Nausea Only   Lyrica [Pregabalin] Nausea Only   Acetaminophen Nausea Only   Discharge Instructions     Call MD for:  difficulty breathing, headache or visual disturbances   Complete by: As directed    Call MD for:  extreme fatigue   Complete by: As directed    Call MD for:  persistant dizziness or light-headedness   Complete by: As directed    Call MD for:  persistant nausea and vomiting   Complete by: As directed    Call MD for:  severe uncontrolled pain   Complete by: As directed    Call MD for:  temperature >100.4   Complete by: As directed    Diet - low sodium heart healthy   Complete by: As directed    Discharge instructions   Complete by: As directed    Follow with PCP in 1 week, continue to monitor BP and follow with PCP and cardiology to titrate medications accordingly. Follow-up with cardiology in 1 week   Discharge wound care:   Complete by: As directed    Wet-to-dry dressing daily.  Avoid pressure, use donut cushion   Increase activity slowly   Complete by: As directed        The results of significant diagnostics from this hospitalization  (including imaging, microbiology, ancillary and laboratory) are listed below for reference.    Significant Diagnostic Studies: CARDIAC CATHETERIZATION  Result Date: 01/29/2023 1.  Mildly calcified coronary arteries with mild nonobstructive coronary artery disease. 2.  Left ventricular angiography was not performed.  EF was moderately reduced by echo. 3.  Right heart catheterization showed normal filling pressures, mild pulmonary hypertension and normal cardiac output. Recommendations: The patient has nonischemic cardiomyopathy likely tachycardia induced.  Recommend medical therapy. Continue Toprol for anticoagulation. Resume heparin 2 hours after sheath pull and likely can transition to Eliquis later this evening.   ECHOCARDIOGRAM LIMITED  Result Date: 01/26/2023    ECHOCARDIOGRAM LIMITED REPORT   Patient Name:   VLADIMIR MICHONSKI Blocher Date of Exam: 01/26/2023 Medical Rec #:  [id number]       Height:       71.0 in Accession #:    [id number]      Weight:       141.8 lb Date of Birth:  1955-10-15       BSA:          1.822 m Patient Age:    66 years        BP:           139/87 mmHg Patient Gender: M               HR:           109 bpm. Exam Location:  [facility] Procedure: Limited Echo Indications:     CHF  History:         Patient has prior history of Echocardiogram examinations, most                  recent 09/11/2022. CHF, COPD, Arrythmias:Atrial Fibrillation,                  Signs/Symptoms:Shortness of Breath; Risk Factors:Hypertension,                  Diabetes, Dyslipidemia and Current Smoker. Definity was not                  used due to poor windows.  Sonographer:     Mikki Harbor Referring Phys:  [id number] CADENCE H FURTH Diagnosing Phys: Lorine Bears MD  Sonographer Comments: Technically challenging study due to limited acoustic windows, no apical window and suboptimal parasternal window. IMPRESSIONS  1. Left ventricular ejection fraction, by estimation, is 30 to 35%. The left ventricle has moderately decreased  function. Left ventricular endocardial border not optimally defined to evaluate regional wall motion.  2. Right ventricular systolic function is normal. The right ventricular size is normal.  3. Left atrial size was moderately dilated.  4. The mitral valve is normal in structure. No evidence of mitral valve regurgitation. No evidence of mitral stenosis.  5. The aortic valve is calcified. Aortic valve regurgitation is not visualized. No aortic stenosis is present.  6. Aortic dilatation noted. There is mild dilatation of the aortic root, measuring 41 mm.  7. Challenging image quality. Most valves not well seen. FINDINGS  Left Ventricle: Left ventricular ejection fraction, by estimation, is 30 to 35%. The left ventricle has moderately decreased function. Left ventricular endocardial border not optimally defined to evaluate regional wall motion. The left ventricular internal cavity size was normal in size. There is no left ventricular hypertrophy. Right Ventricle: The right ventricular size is normal. No increase in right ventricular wall thickness. Right ventricular systolic function is normal. Left Atrium: Left atrial size was moderately dilated. Right Atrium: Right atrial size was normal in size. Pericardium: There is no evidence of pericardial effusion. Mitral Valve: The mitral valve is normal in structure. There is mild thickening of the mitral valve leaflet(s). No evidence of mitral valve stenosis. Tricuspid Valve: The tricuspid valve is normal in structure. Tricuspid valve regurgitation is not demonstrated. No evidence of tricuspid stenosis. Aortic Valve: The aortic valve is calcified. Aortic valve regurgitation is not visualized. No aortic stenosis is present. Pulmonic Valve: The pulmonic valve was normal in structure. Pulmonic valve regurgitation is not visualized. No evidence of pulmonic stenosis. Aorta: Aortic dilatation noted. There is mild dilatation of the aortic root, measuring 41 mm. Venous: The inferior  vena cava was not well visualized. IAS/Shunts: No atrial level shunt detected by color flow Doppler. LEFT VENTRICLE PLAX 2D LVIDd:         4.90 cm LVIDs:         4.20 cm LV PW:         1.00 cm LV IVS:        0.90 cm LVOT diam:     2.30 cm LVOT Area:     4.15 cm  LEFT ATRIUM         Index LA diam:    5.00 cm 2.74 cm/m   AORTA Ao Root diam: 4.10 cm  SHUNTS Systemic Diam: 2.30 cm Lorine Bears MD Electronically signed by Lorine Bears MD Signature Date/Time: 01/26/2023/3:04:26 PM    Final    DG Chest Port 1 View  Result Date: 01/21/2023 CLINICAL DATA:  Shortness of breath.  Cough. EXAM: PORTABLE CHEST 1 VIEW COMPARISON:  01/13/2023 FINDINGS: The lungs are clear without focal pneumonia, edema, pneumothorax or pleural effusion. Interstitial markings are diffusely coarsened with chronic features. The cardiopericardial silhouette is within normal limits for size. Old posterior right rib fracture evident. Telemetry leads overlie the chest. IMPRESSION: Chronic interstitial coarsening without acute cardiopulmonary findings. Electronically Signed   By: Kennith Center M.D.   On: 01/21/2023 06:10   DG Chest 2 View  Result Date: 01/13/2023 CLINICAL DATA:  Shortness of breath. EXAM: CHEST - 2 VIEW COMPARISON:  Chest  radiograph dated 04/19/2021. FINDINGS: Background of emphysema. No focal consolidation, pleural effusion, or pneumothorax. The cardiac silhouette is within normal limits. No acute osseous pathology. IMPRESSION: No active cardiopulmonary disease. Electronically Signed   By: Elgie Collard M.D.   On: 01/13/2023 03:48    Microbiology: Recent Results (from the past 240 hour(s))  Respiratory (~20 pathogens) panel by PCR     Status: Abnormal   Collection Time: 01/22/23  3:47 AM   Specimen: Nasopharyngeal Swab; Respiratory  Result Value Ref Range Status   Adenovirus NOT DETECTED NOT DETECTED Final   Coronavirus 229E NOT DETECTED NOT DETECTED Final    Comment: (NOTE) The Coronavirus on the Respiratory  Panel, DOES NOT test for the novel  Coronavirus (2019 nCoV)    Coronavirus HKU1 NOT DETECTED NOT DETECTED Final   Coronavirus NL63 NOT DETECTED NOT DETECTED Final   Coronavirus OC43 NOT DETECTED NOT DETECTED Final   Metapneumovirus NOT DETECTED NOT DETECTED Final   Rhinovirus / Enterovirus DETECTED (A) NOT DETECTED Final   Influenza A NOT DETECTED NOT DETECTED Final   Influenza B NOT DETECTED NOT DETECTED Final   Parainfluenza Virus 1 NOT DETECTED NOT DETECTED Final   Parainfluenza Virus 2 NOT DETECTED NOT DETECTED Final   Parainfluenza Virus 3 NOT DETECTED NOT DETECTED Final   Parainfluenza Virus 4 NOT DETECTED NOT DETECTED Final   Respiratory Syncytial Virus NOT DETECTED NOT DETECTED Final   Bordetella pertussis NOT DETECTED NOT DETECTED Final   Bordetella Parapertussis NOT DETECTED NOT DETECTED Final   Chlamydophila pneumoniae NOT DETECTED NOT DETECTED Final   Mycoplasma pneumoniae NOT DETECTED NOT DETECTED Final    Comment: Performed at [facility], [address], [place], [place] [address]  Expectorated Sputum Assessment w Gram Stain, Rflx to Resp Cult     Status: None   Collection Time: 01/22/23  4:05 PM   Specimen: Sputum  Result Value Ref Range Status   Specimen Description SPUTUM  Final   Special Requests NONE  Final   Sputum evaluation   Final    THIS SPECIMEN IS ACCEPTABLE FOR SPUTUM CULTURE Performed at [facility], [address] [place], [place] [address]    Report Status 01/22/2023 FINAL  Final  Culture, Respiratory w Gram Stain     Status: None   Collection Time: 01/22/23  4:05 PM   Specimen: SPU  Result Value Ref Range Status   Specimen Description   Final    SPUTUM Performed at [facility], [address], [place], [place] [address]    Special Requests   Final    NONE Reflexed from [id number] Performed at [facility], [address] [place], [place] [address]    Gram Stain   Final    FEW WBC PRESENT,BOTH PMN AND  MONONUCLEAR FEW GRAM POSITIVE COCCI IN PAIRS FEW GRAM NEGATIVE RODS    Culture   Final    MODERATE Normal respiratory flora-no Staph aureus or Pseudomonas seen Performed at [facility], [address] [place], [place] [address]    Report Status 01/25/2023 FINAL  Final     Labs: CBC: Recent Labs  Lab 01/26/23 0545 01/27/23 0534 01/28/23 0520 01/29/23 0524 01/29/23 0945 01/29/23 0948 01/30/23 0539  WBC 12.6* 11.8* 12.1* 12.2*  --   --  8.2  HGB 15.6 15.7 14.0 13.9 13.9 14.3 15.3  HCT 46.7 45.8 41.6 42.6 41.0 42.0 46.1  MCV 91.2 90.5 91.0 92.4  --   --  91.3  PLT  300 303 248 236  --   --  223   Basic Metabolic Panel: Recent Labs  Lab 01/26/23 0545 01/27/23 0534 01/27/23 1906 01/28/23 0520 01/29/23 0945 01/29/23 0948 01/30/23 0539 01/31/23 1018  NA 132* 129* 132* 133* 134* 135 135 137  K 5.0 5.0 5.3* 4.6 4.2 4.2 4.2 4.4  CL 96* 93* 96* 99  --   --  98 98  CO2 27 27 27 28   --   --  29 31  GLUCOSE 216* 250* 177* 111*  --   --  145* 117*  BUN 32* 45* 48* 36*  --   --  32* 31*  CREATININE 0.84 0.91 1.00 0.82  --   --  0.74 0.91  CALCIUM 9.2 9.0 8.8* 8.4*  --   --  8.6* 8.8*  MG 2.4  --   --   --   --   --   --  2.5*  PHOS  --   --   --   --   --   --   --  4.5   Liver Function Tests: No results for input(s): "AST", "ALT", "ALKPHOS", "BILITOT", "PROT", "ALBUMIN" in the last 168 hours. No results for input(s): "LIPASE", "AMYLASE" in the last 168 hours. No results for input(s): "AMMONIA" in the last 168 hours. Cardiac Enzymes: No results for input(s): "CKTOTAL", "CKMB", "CKMBINDEX", "TROPONINI" in the last 168 hours. BNP (last 3 results) Recent Labs    12/11/22 1533 01/21/23 0550  BNP 89 19.7   CBG: Recent Labs  Lab 01/30/23 1636 01/30/23 2139 01/31/23 0725 01/31/23 1117 01/31/23 1612  GLUCAP 164* 164* 161* 95 188*    Time spent: 35 minutes  Signed:  Gillis Santa  [facility] 01/31/2023 5:03 PM

## 2023-02-01 ENCOUNTER — Emergency Department: Payer: PPO

## 2023-02-01 ENCOUNTER — Other Ambulatory Visit: Payer: Self-pay

## 2023-02-01 ENCOUNTER — Emergency Department
Admission: EM | Admit: 2023-02-01 | Discharge: 2023-02-01 | Disposition: A | Discharge status: Home / Self Care | Payer: PPO | Attending: Emergency Medicine | Admitting: Emergency Medicine

## 2023-02-01 ENCOUNTER — Encounter: Payer: Self-pay | Admitting: Emergency Medicine

## 2023-02-01 DIAGNOSIS — M79671 Pain in right foot: Secondary | ICD-10-CM | POA: Insufficient documentation

## 2023-02-01 DIAGNOSIS — M79604 Pain in right leg: Secondary | ICD-10-CM | POA: Diagnosis not present

## 2023-02-01 DIAGNOSIS — I10 Essential (primary) hypertension: Secondary | ICD-10-CM | POA: Diagnosis not present

## 2023-02-01 DIAGNOSIS — J449 Chronic obstructive pulmonary disease, unspecified: Secondary | ICD-10-CM | POA: Diagnosis not present

## 2023-02-01 DIAGNOSIS — E119 Type 2 diabetes mellitus without complications: Secondary | ICD-10-CM | POA: Insufficient documentation

## 2023-02-01 LAB — CBC WITH DIFFERENTIAL/PLATELET
Abs Immature Granulocytes: 0.09 10*3/uL — ABNORMAL HIGH (ref 0.00–0.07)
Basophils Absolute: 0 10*3/uL (ref 0.0–0.1)
Basophils Relative: 0 %
Eosinophils Absolute: 0.1 10*3/uL (ref 0.0–0.5)
Eosinophils Relative: 0 %
HCT: 43.6 % (ref 39.0–52.0)
Hemoglobin: 14.2 g/dL (ref 13.0–17.0)
Immature Granulocytes: 1 %
Lymphocytes Relative: 17 %
Lymphs Abs: 2.2 10*3/uL (ref 0.7–4.0)
MCH: 30.4 pg (ref 26.0–34.0)
MCHC: 32.6 g/dL (ref 30.0–36.0)
MCV: 93.4 fL (ref 80.0–100.0)
Monocytes Absolute: 1.1 10*3/uL — ABNORMAL HIGH (ref 0.1–1.0)
Monocytes Relative: 8 %
Neutro Abs: 10.1 10*3/uL — ABNORMAL HIGH (ref 1.7–7.7)
Neutrophils Relative %: 74 %
Platelets: 223 10*3/uL (ref 150–400)
RBC: 4.67 MIL/uL (ref 4.22–5.81)
RDW: 13.9 % (ref 11.5–15.5)
WBC: 13.6 10*3/uL — ABNORMAL HIGH (ref 4.0–10.5)
nRBC: 0 % (ref 0.0–0.2)

## 2023-02-01 LAB — BASIC METABOLIC PANEL
Anion gap: 8 (ref 5–15)
BUN: 48 mg/dL — ABNORMAL HIGH (ref 8–23)
CO2: 27 mmol/L (ref 22–32)
Calcium: 9.3 mg/dL (ref 8.9–10.3)
Chloride: 97 mmol/L — ABNORMAL LOW (ref 98–111)
Creatinine, Ser: 1.17 mg/dL (ref 0.61–1.24)
GFR, Estimated: >60 mL/min (ref >60)
Glucose, Bld: 174 mg/dL — ABNORMAL HIGH (ref 70–99)
Potassium: 5.3 mmol/L — ABNORMAL HIGH (ref 3.5–5.1)
Sodium: 132 mmol/L — ABNORMAL LOW (ref 135–145)

## 2023-02-01 MED ORDER — TRAMADOL HCL 50 MG PO TABS
50.0000 mg | ORAL_TABLET | Freq: Once | ORAL | Status: AC
  Administered 2023-02-01: 50 mg via ORAL
  Filled 2023-02-01: qty 1

## 2023-02-01 NOTE — ED Provider Notes (Signed)
[facility] Provider Note    Event Date/Time   First MD Initiated Contact with Patient 02/01/23 0720     (approximate)   History   Foot Pain   HPI  Mark Nash is a 67 y.o. male   presents to the ED with complaint of right foot pain.  Patient denies any injury or previous symptoms similar to this.  Patient was recently hospitalized for 11 days and was discharged yesterday for exacerbation of his COPD.  Patient has a history of diabetes, COPD, chronic atrial flutter, hypertension, peripheral vascular disease, lymphedema, diabetic polyneuropathy.   Physical Exam   Triage Vital Signs: ED Triage Vitals  Enc Vitals Group     BP 02/01/23 0442 104/76     Pulse Rate 02/01/23 0442 78     Resp 02/01/23 0442 17     Temp 02/01/23 0442 98.4 F (36.9 C)     Temp Source 02/01/23 0442 Oral     SpO2 02/01/23 0442 100 %     Weight 02/01/23 0441 130 lb 1.1 oz (59 kg)     Height 02/01/23 0441 5\' 11"  (1.803 m)     Head Circumference --      Peak Flow --      Pain Score 02/01/23 0441 10     Pain Loc --      Pain Edu? --      Excl. in GC? --     Most recent vital signs: Vitals:   02/01/23 0847 02/01/23 0936  BP: 110/70 108/68  Pulse: 70 71  Resp: 16 18  Temp: 98 F (36.7 C) 98.4 F (36.9 C)  SpO2: 100% 100%     General: Awake, no distress.  Alert, talkative, cooperative. CV:  Good peripheral perfusion.  Heart regular rate and rhythm. Resp:  Normal effort.  Lungs are clear. Abd:  No distention.  Other:  Right foot without deformity or gross edema.  Tender palpation plantar aspect proximal area.  Skin is intact.  There is callus formation present with no injury to this area.  Diffuse tenderness noted to the distal portion of the right lower extremity.  No pitting edema present.  No gross edema in comparison with the left lower extremity.   ED Results / Procedures / Treatments   Labs (all labs ordered are listed, but only abnormal results are  displayed) Labs Reviewed  CBC WITH DIFFERENTIAL/PLATELET - Abnormal; Notable for the following components:      Result Value   WBC 13.6 (*)    Neutro Abs 10.1 (*)    Monocytes Absolute 1.1 (*)    Abs Immature Granulocytes 0.09 (*)    All other components within normal limits  BASIC METABOLIC PANEL - Abnormal; Notable for the following components:   Sodium 132 (*)    Potassium 5.3 (*)    Chloride 97 (*)    Glucose, Bld 174 (*)    BUN 48 (*)    All other components within normal limits     RADIOLOGY Venous ultrasound of the right lower extremity was negative for DVT. Right foot x-ray images were reviewed by myself independent of the radiologist and was negative for fracture but calcaneal spur and degenerative changes was noted.    PROCEDURES:  Critical Care performed:   Procedures   MEDICATIONS ORDERED IN ED: Medications  traMADol (ULTRAM) tablet 50 mg (50 mg Oral Given 02/01/23 1107)     IMPRESSION / MDM / ASSESSMENT AND PLAN / ED COURSE  I reviewed the triage vital signs and the nursing notes.   Differential diagnosis includes, but is not limited to, DVT, osteoarthritis, muscle skeletal pain, muscle skeletal strain, contusion, cellulitis.  67 year old male presents to the ED with complaint of right lower extremity pain mostly located in his right foot.  No history of injury however it was concerning that patient was hospitalized for several days for his COPD exacerbation.  Ultrasound was negative for DVT and patient was made aware.  He has also made aware that his foot shows degenerative changes.  Patient presently has a prescription for tramadol at home but has not taken 1.  Glucose was 174 and BUN was 48.  Patient states he has not taken his diabetes medication today nor is he drank much fluids since he was discharged from the hospital.  He is encouraged to follow-up with his PCP and continue with his tramadol that he has at home only as directed by his PCP.  Patient was  up and ambulatory several times while in the emergency department.      Patient's presentation is most consistent with acute complicated illness / injury requiring diagnostic workup.  FINAL CLINICAL IMPRESSION(S) / ED DIAGNOSES   Final diagnoses:  Right foot pain     Rx / DC Orders   ED Discharge Orders     None        Note:  This document was prepared using Dragon voice recognition software and may include unintentional dictation errors.   Tommi Rumps, PA-C 02/01/23 1647    Sharman Cheek, MD 02/13/23 [phone]

## 2023-02-01 NOTE — ED Triage Notes (Signed)
Pt reports that he is having pain to bottom of rt foot. Denies injury. Denies any other sx's.

## 2023-02-01 NOTE — Discharge Instructions (Addendum)
Call and make an appointment with your doctor.  Continue taking your Tramadol at home as instructed by your doctor

## 2023-02-06 ENCOUNTER — Telehealth: Payer: Self-pay

## 2023-02-06 NOTE — Telephone Encounter (Signed)
Transition Care Management Follow-up Telephone Call Date of discharge and from where: [facility] How have you been since you were released from the hospital? Doing better Any questions or concerns? No  Items Reviewed: Did the pt receive and understand the discharge instructions provided? Yes  Medications obtained and verified? Yes  Other? No  Any new allergies since your discharge? No  Dietary orders reviewed? No Do you have support at home? Yes    Follow up appointments reviewed:  PCP Hospital f/u appt confirmed? Yes  Scheduled to see pcp on 6/12 @ . Specialist Hospital f/u appt confirmed? No  Scheduled to see  on  @ . Are transportation arrangements needed? No  If their condition worsens, is the pt aware to call PCP or go to the Emergency Dept.? Yes Was the patient provided with contact information for the PCP's office or ED? Yes Was to pt encouraged to call back with questions or concerns? Yes

## 2023-02-06 NOTE — Progress Notes (Unsigned)
Name: Mark Nash   MRN: [id number]    DOB: 1955-12-13   Date:02/07/2023       Progress Note  Subjective  Chief Complaint  Hospital Follow-Up  HPI  Admitted: 01/21/23 Discharged: 01/31/23  Patient went in to [facility] on 05/26 due to increase in SOB. He was admitted with acute respiratory failure with hypoxia, COPD exacerbation due to rhinovirus URI and acute on chronic CHF. He was found to have hyponatremia, hyperkalemia and low magnesium during a visit to [facility] for an unrelated problems ( neuropathic feet pain) and had Doppler US and X-ray that showed some arthritis of foot   Echo showed a drop of EF during his stay and cardizem was stopped, started on metoprolol, continued on Farxiga and low dose losartan , he was also given digoxin due paroxysma afib and atrial flutter with RVR  Medication reconciliation was done  Labs and studies reviewed with patient   He states he is feeling much better, no lower extremity edema, however he still has episodes of feeling tired and drained during activity. He was advised to hold dose of losartan if below 110 , however he has been taking it daily since he does not have a BP cuff at home. BP today is very low, we will try switching metoprolol to qpm, explained importance of staying hydrated and keeping appointment with cardiologist.   Patient Active Problem List   Diagnosis Date Noted   HFrEF (heart failure with reduced ejection fraction) (HCC) 01/30/2023   Acute on chronic combined systolic and diastolic CHF (congestive heart failure) (HCC) 01/28/2023   Pressure injury of skin 01/28/2023   Chronic atrial flutter (HCC) 01/26/2023   Acute systolic heart failure (HCC) 01/26/2023   Acute respiratory failure with hypoxia (HCC) 01/24/2023   Rhinovirus infection 01/23/2023   COPD exacerbation (HCC) 01/21/2023   Senile purpura (HCC) 11/15/2022   Chronic a-fib (HCC) 03/15/2022   Type 2 diabetes mellitus with diabetic polyneuropathy, with long-term current use  of insulin (HCC) 03/16/2021   DDD (degenerative disc disease), lumbar 03/06/2019   Primary osteoarthritis involving multiple joints 03/06/2019   Lumbar radiculopathy 11/05/2018   Lumbar post-laminectomy syndrome 11/05/2018   Diabetic polyneuropathy associated with type 2 diabetes mellitus (HCC) 11/05/2018   CLE (columnar lined esophagus)    Gastric irritation    Duodenum ulcer    Personal history of colonic polyps    Polyp of sigmoid colon    Diverticulosis of large intestine without diverticulitis    Benign neoplasm of ascending colon    Osteoarthritis of both hands 09/04/2017   Chronic pain of left knee 09/04/2017   Lymphedema 07/07/2016   Chronic hepatitis C without hepatic coma (HCC) 06/01/2016   Lumbar herniated disc 11/24/2015   Thrombocytopenia (HCC) 11/11/2015   Hepatitis C antibody test positive 07/13/2015   Chronic constipation 07/13/2015   CN (constipation) 07/13/2015   Diabetes mellitus with coincident hypertension (HCC) 03/08/2015   Dyslipidemia 03/08/2015   Paranoid schizophrenia (HCC) 03/08/2015   Restless leg 03/08/2015   Callus of foot 03/08/2015   Claudication (HCC) 03/08/2015   Chronic obstructive pulmonary disease (HCC) 03/08/2015   Corn or callus 03/08/2015   Essential (primary) hypertension 03/08/2015   HLD (hyperlipidemia) 03/08/2015   Peripheral vascular disease (HCC) 03/08/2015   Polyneuropathy 03/08/2015   Current tobacco use 03/08/2015   Vitamin D deficiency 04/26/2009   Avitaminosis D 04/26/2009    Past Surgical History:  Procedure Laterality Date   BACK SURGERY     CARDIOVERSION N/A 05/20/2021  Procedure: CARDIOVERSION;  Surgeon: Iran Ouch, MD;  Location: [facility] ORS;  Service: Cardiovascular;  Laterality: N/A;   CATARACT EXTRACTION W/PHACO Right 10/31/2021   Procedure: CATARACT EXTRACTION PHACO AND INTRAOCULAR LENS PLACEMENT (IOC) RIGHT DIABETIC;  Surgeon: Nevada Crane, MD;  Location: [facility];  Service: Ophthalmology;   Laterality: Right;  Diabetic 4.34 00:42.8   COLONOSCOPY WITH PROPOFOL N/A 09/07/2017   Procedure: COLONOSCOPY WITH PROPOFOL;  Surgeon: Pasty Spillers, MD;  Location: [facility] END[facility]COPY;  Service: Endoscopy;  Laterality: N/A;   ESOPHAGOGASTRODUODENOSCOPY (EGD) WITH PROPOFOL N/A 12/07/2017   Procedure: ESOPHAGOGASTRODUODENOSCOPY (EGD) WITH PROPOFOL;  Surgeon: Pasty Spillers, MD;  Location: [facility] ENDOSCOPY;  Service: Endoscopy;  Laterality: N/A;   EYE SURGERY     FLEXIBLE SIGMOIDOSCOPY N/A 11/09/2017   Procedure: FLEXIBLE SIGMOIDOSCOPY;  Surgeon: Pasty Spillers, MD;  Location: [facility] ENDOSCOPY;  Service: Endoscopy;  Laterality: N/A;   HERNIA REPAIR     inguinal   INGUINAL HERNIA REPAIR Bilateral 06/21/2018   Procedure: LAPAROSCOPIC BILATERAL INGUINAL HERNIA REPAIR;  Surgeon: Ancil Linsey, MD;  Location: [facility] ORS;  Service: General;  Laterality: Bilateral;   L3 TO L5 LAMINECTOMY FOR DECOMPRESSION  07/17/2016   [facility]   REPAIR OF CEREBROSPINAL FLUID LEAK N/A 09/08/2016   Procedure: Lumbar wound exploration, repair of pseudomenigocele;  Surgeon: Loura Halt Ditty, MD;  Location: [facility] OR;  Service: Neurosurgery;  Laterality: N/A;  Lumbar wound exploration, repair of pseudomenigocele   RIGHT/LEFT HEART CATH AND CORONARY ANGIOGRAPHY N/A 01/29/2023   Procedure: RIGHT/LEFT HEART CATH AND CORONARY ANGIOGRAPHY;  Surgeon: Iran Ouch, MD;  Location: [facility] IN[facility];  Service: Cardiovascular;  Laterality: N/A;   SINUSOTOMY      Family History  Problem Relation Age of Onset   Heart failure Mother    Hypertension Mother    Diverticulitis Mother    Colitis Mother    Diabetes Father    Hypertension Father    Heart disease Father    Diabetes Sister    Heart failure Brother    Diverticulitis Brother     Social History   Tobacco Use   Smoking status: Every Day    Packs/day: 1.50    Years: 46.00    Additional pack years: 0.00    Total pack  years: 69.00    Types: Cigarettes   Smokeless tobacco: Never  Substance Use Topics   Alcohol use: No    Alcohol/week: 0.0 standard drinks of alcohol     Current Outpatient Medications:    apixaban (ELIQUIS) 5 MG TABS tablet, Take 1 tablet by mouth twice daily, Disp: 60 tablet, Rfl: 5   blood glucose meter kit and supplies KIT, Dispense based on patient and insurance preference. Use up to tid E11.65, LON:99, Disp: 1 each, Rfl: 0   Budeson-Glycopyrrol-Formoterol (BREZTRI AEROSPHERE) 160-9-4.8 MCG/ACT AERO, Inhale 2 puffs into the lungs 2 (two) times daily. Patient receives via [employer] Patient Assistance., Disp: 32.1 g, Rfl: 3   dapagliflozin propanediol (FARXIGA) 10 MG TABS tablet, Take 1 tablet (10 mg total) by mouth daily., Disp: 90 tablet, Rfl: 3   digoxin (LANOXIN) 0.25 MG tablet, Take 1 tablet (0.25 mg total) by mouth daily., Disp: 30 tablet, Rfl: 5   gabapentin (NEURONTIN) 300 MG capsule, Take 300-600 mg by mouth See admin instructions. Take 1 capsule (300 mg) by mouth in the morning, take 1 capsule (300 mg) by mouth at noon, and 2 capsules (600 mg) by mouth in the evening, Disp: , Rfl:  Lancet Devices MISC, 1 each by Does not apply route 3 (three) times daily as needed. What ins will cover dx:E11.65, LON:99 tid, Disp: 100 each, Rfl: 12   LEVEMIR 100 UNIT/ML injection, INJECT 8 UNITS SUBCUTANEOUSLY AT BEDTIME, Disp: 10 mL, Rfl: 0   losartan (COZAAR) 25 MG tablet, Take 1 tablet (25 mg total) by mouth daily. Skip the dose if systolic BP less than 110 mmHg, Disp: 30 tablet, Rfl: 5   metoprolol succinate (TOPROL-XL) 200 MG 24 hr tablet, Take 1 tablet (200 mg total) by mouth daily. Take with or immediately following a meal., Disp: 30 tablet, Rfl: 11   pravastatin (PRAVACHOL) 20 MG tablet, Take 1 tablet (20 mg total) by mouth daily with supper., Disp: 90 tablet, Rfl: 3   tiZANidine (ZANAFLEX) 4 MG tablet, Take 4 mg by mouth every 12 (twelve) hours., Disp: , Rfl:    traMADol (ULTRAM) 50 MG tablet,  Take 50 mg by mouth every 12 (twelve) hours., Disp: , Rfl:    Vitamin D, Ergocalciferol, (DRISDOL) 1.25 MG (50000 UNIT) CAPS capsule, TAKE 1 CAPSULE BY MOUTH EVERY TWO WEEKS, Disp: 6 capsule, Rfl: 0  Allergies  Allergen Reactions   Penicillins Hives and Swelling    As a child. Has patient had a PCN reaction causing immediate rash, facial/tongue/throat swelling, SOB or lightheadedness with hypotension: YES  Has patient had a PCN reaction causing severe rash involving mucus membranes or skin necrosis: UNKNOWN Has patient had a PCN reaction that required hospitalization: UNKNOWN Has patient had a PCN reaction occurring within the last 10 years: NO   Aspirin Nausea Only   Ibuprofen Nausea Only   Lyrica [Pregabalin] Nausea Only   Acetaminophen Nausea Only    I personally reviewed active problem list, medication list, allergies, family history, social history, health maintenance with the patient/caregiver today.   ROS  Ten systems reviewed and is negative except as mentioned in HPI   Objective  Vitals:   02/07/23 1438  BP: (!) 108/58  Pulse: 70  Resp: 16  SpO2: 98%  Weight: 143 lb (64.9 kg)  Height: 5\' 11"  (1.803 m)    Body mass index is 19.94 kg/m.  Physical Exam  Constitutional: Patient appears well-developed and well-nourished. No distress.  HEENT: head atraumatic, normocephalic, pupils equal and reactive to light, neck supple Cardiovascular: Normal rate, regular rhythm and normal heart sounds.  No murmur heard. No BLE edema. Pulmonary/Chest: Effort normal and breath sounds normal. No respiratory distress. Abdominal: Soft.  There is no tenderness. Psychiatric: Patient has a normal mood and affect. behavior is normal. Judgment and thought content normal.    PHQ2/9:    02/07/2023    2:38 PM 12/27/2022    3:18 PM 12/18/2022    2:07 PM 12/11/2022    3:07 PM 11/15/2022    2:08 PM  Depression screen PHQ 2/9  Decreased Interest 0 0 0 0 0  Down, Depressed, Hopeless 0 0 0 0 0   PHQ - 2 Score 0 0 0 0 0  Altered sleeping   0 0 0  Tired, decreased energy   1 0 3  Change in appetite   0 0 0  Feeling bad or failure about yourself    0 0 0  Trouble concentrating   0 0 0  Moving slowly or fidgety/restless   0 0 0  Suicidal thoughts   0 0 0  PHQ-9 Score   1 0 3  Difficult doing work/chores   Not difficult at all Not difficult  at all     phq 9 is negative   Fall Risk:    02/07/2023    2:38 PM 12/27/2022    3:18 PM 12/18/2022    2:06 PM 12/11/2022    3:04 PM 11/15/2022    2:08 PM  Fall Risk   Falls in the past year? 0 0 0 0 0  Number falls in past yr: 0 0  0 0  Injury with Fall? 0 0  0 0  Risk for fall due to : Impaired balance/gait No Fall Risks No Fall Risks  No Fall Risks  Follow up Falls prevention discussed Falls prevention discussed Falls prevention discussed;Education provided;Falls evaluation completed  Falls prevention discussed      Functional Status Survey: Is the patient deaf or have difficulty hearing?: No Does the patient have difficulty seeing, even when wearing glasses/contacts?: No Does the patient have difficulty concentrating, remembering, or making decisions?: No Does the patient have difficulty walking or climbing stairs?: Yes Does the patient have difficulty dressing or bathing?: Yes Does the patient have difficulty doing errands alone such as visiting a doctor's office or shopping?: Yes    Assessment & Plan   1. Hospital discharge follow-up   2. COPD exacerbation (HCC)  Finished steroids and breathing is fine  3. Acute on chronic combined systolic and diastolic CHF (congestive heart failure) (HCC)  He is off cardizem and is taking Metoprolol 200 mg and losartan 25 mg, BP is low - advised to take metoprolol at night . Needs to stay hydrated and may need to go down on dose of metoprolol but will wait until he sees cardiologist in 2 weeks.   4. Acute respiratory failure with hypoxia (HCC)  Resolved   5. Hyperkalemia  -  COMPLETE METABOLIC PANEL WITH GFR  6. Low magnesium level  - Magnesium  7. B12 deficiency  - Vitamin B12 - CBC with Differential/Platelet  8. Bandemia  - CBC with Differential/Platelet  9. Hypotension due to drugs  May need to go down on metoprolol to 100 mg daily but due to rapid afib we will wait until he sees cardiologist

## 2023-02-07 ENCOUNTER — Ambulatory Visit (INDEPENDENT_AMBULATORY_CARE_PROVIDER_SITE_OTHER): Payer: PPO | Admitting: Family Medicine

## 2023-02-07 ENCOUNTER — Encounter: Payer: Self-pay | Admitting: Family Medicine

## 2023-02-07 VITALS — BP 108/58 | HR 70 | Resp 16 | Ht 71.0 in | Wt 143.0 lb

## 2023-02-07 DIAGNOSIS — E875 Hyperkalemia: Secondary | ICD-10-CM | POA: Diagnosis not present

## 2023-02-07 DIAGNOSIS — J441 Chronic obstructive pulmonary disease with (acute) exacerbation: Secondary | ICD-10-CM

## 2023-02-07 DIAGNOSIS — D72825 Bandemia: Secondary | ICD-10-CM

## 2023-02-07 DIAGNOSIS — J9601 Acute respiratory failure with hypoxia: Secondary | ICD-10-CM

## 2023-02-07 DIAGNOSIS — D649 Anemia, unspecified: Secondary | ICD-10-CM | POA: Diagnosis not present

## 2023-02-07 DIAGNOSIS — E538 Deficiency of other specified B group vitamins: Secondary | ICD-10-CM | POA: Diagnosis not present

## 2023-02-07 DIAGNOSIS — Z09 Encounter for follow-up examination after completed treatment for conditions other than malignant neoplasm: Secondary | ICD-10-CM | POA: Diagnosis not present

## 2023-02-07 DIAGNOSIS — I952 Hypotension due to drugs: Secondary | ICD-10-CM

## 2023-02-07 DIAGNOSIS — R790 Abnormal level of blood mineral: Secondary | ICD-10-CM

## 2023-02-07 DIAGNOSIS — I5043 Acute on chronic combined systolic (congestive) and diastolic (congestive) heart failure: Secondary | ICD-10-CM

## 2023-02-08 LAB — COMPLETE METABOLIC PANEL WITH GFR
ALT: 34 U/L (ref 9–46)
Albumin: 3.5 g/dL — ABNORMAL LOW (ref 3.6–5.1)
Alkaline phosphatase (APISO): 80 U/L (ref 35–144)
Globulin: 1.9 g/dL (calc) (ref 1.9–3.7)
Glucose, Bld: 83 mg/dL (ref 65–99)
Potassium: 3.9 mmol/L (ref 3.5–5.3)
Total Bilirubin: 0.7 mg/dL (ref 0.2–1.2)
Total Protein: 5.4 g/dL — ABNORMAL LOW (ref 6.1–8.1)

## 2023-02-08 LAB — CBC WITH DIFFERENTIAL/PLATELET
Basophils Absolute: 21 cells/uL (ref 0–200)
Basophils Relative: 0.3 %
Hemoglobin: 12.4 g/dL — ABNORMAL LOW (ref 13.2–17.1)
Lymphs Abs: 1981 cells/uL (ref 850–3900)
MCHC: 33.2 g/dL (ref 32.0–36.0)
MCV: 92.3 fL (ref 80.0–100.0)
Total Lymphocyte: 27.9 %
WBC: 7.1 10*3/uL (ref 3.8–10.8)

## 2023-02-08 LAB — VITAMIN B12: Vitamin B-12: 457 pg/mL (ref 200–1100)

## 2023-02-08 LAB — MAGNESIUM: Magnesium: 2.2 mg/dL (ref 1.5–2.5)

## 2023-02-09 ENCOUNTER — Other Ambulatory Visit: Payer: Self-pay

## 2023-02-09 DIAGNOSIS — D649 Anemia, unspecified: Secondary | ICD-10-CM

## 2023-02-10 LAB — CBC WITH DIFFERENTIAL/PLATELET
Absolute Monocytes: 568 cells/uL (ref 200–950)
Eosinophils Absolute: 156 cells/uL (ref 15–500)
Eosinophils Relative: 2.2 %
HCT: 37.3 % — ABNORMAL LOW (ref 38.5–50.0)
MCH: 30.7 pg (ref 27.0–33.0)
MPV: 9.4 fL (ref 7.5–12.5)
Monocytes Relative: 8 %
Neutro Abs: 4374 cells/uL (ref 1500–7800)
Neutrophils Relative %: 61.6 %
Platelets: 173 10*3/uL (ref 140–400)
RBC: 4.04 10*6/uL — ABNORMAL LOW (ref 4.20–5.80)
RDW: 14 % (ref 11.0–15.0)

## 2023-02-10 LAB — COMPLETE METABOLIC PANEL WITH GFR
AG Ratio: 1.8 (calc) (ref 1.0–2.5)
AST: 26 U/L (ref 10–35)
BUN: 16 mg/dL (ref 7–25)
CO2: 27 mmol/L (ref 20–32)
Calcium: 8.4 mg/dL — ABNORMAL LOW (ref 8.6–10.3)
Chloride: 108 mmol/L (ref 98–110)
Creat: 0.84 mg/dL (ref 0.70–1.35)
Sodium: 143 mmol/L (ref 135–146)
eGFR: 96 mL/min/{1.73_m2} (ref >=60)

## 2023-02-10 LAB — TEST AUTHORIZATION

## 2023-02-10 LAB — IRON,TIBC AND FERRITIN PANEL
%SAT: 14 % (calc) — ABNORMAL LOW (ref 20–48)
Ferritin: 34 ng/mL (ref 24–380)
Iron: 47 ug/dL — ABNORMAL LOW (ref 50–180)
TIBC: 342 mcg/dL (calc) (ref 250–425)

## 2023-02-15 ENCOUNTER — Telehealth: Payer: Self-pay | Admitting: *Deleted

## 2023-02-15 NOTE — Patient Outreach (Signed)
  Care Coordination   Follow Up Visit Note   02/15/2023 Name: Mark Nash MRN: [id number] DOB: 1956-06-19  Mark Nash is a 67 y.o. year old male who sees Alba Cory, MD for primary care. I spoke with  Jeri Cos by phone today.  What matters to the patients health and wellness today?  Noted patient was seen in ED on 5/18, admitted to hospital 5/26-6/5, both for COPD.  Also presented back to ED again on 6/6 for foot pain.  State he is feeling better, working on health improvement so he's not readmitted.    Goals Addressed             This Visit's Progress    Effective management of COPD       Care Coordination Interventions: Provided patient with basic written and verbal COPD education on self care/management/and exacerbation prevention Advised patient to track and manage COPD triggers Provided instruction about proper use of medications used for management of COPD including inhalers Advised patient to self assesses COPD action plan zone and make appointment with provider if in the yellow zone for 48 hours without improvement      Effective management of DM   On track    Care Coordination Interventions: Provided education to patient about basic DM disease process Reviewed medications with patient and discussed importance of medication adherence Discussed plans with patient for ongoing care management follow up and provided patient with direct contact information for care management team Discussed health maintenance, will need assistance with transportation for colonscopy, family lives out of town.  He will notify this care manager when scheduled so resources can be provided. A1C currently 7.6, goal of less than 7         SDOH assessments and interventions completed:  No     Care Coordination Interventions:  Yes, provided   Interventions Today    Flowsheet Row Most Recent Value  Chronic Disease   Chronic disease during today's visit Chronic Obstructive  Pulmonary Disease (COPD), Diabetes, Atrial Fibrillation (AFib)  General Interventions   General Interventions Discussed/Reviewed General Interventions Reviewed, Doctor Visits, Annual Foot Exam, Durable Medical Equipment (DME)  Doctor Visits Discussed/Reviewed Doctor Visits Reviewed, PCP, Specialist  [podiatry 7/1, cardiology 7/2, PCP 7/24]  Durable Medical Equipment (DME) BP Cuff, Glucomoter  PCP/Specialist Visits Compliance with follow-up visit  [Seen by PCP on 6/12]  Education Interventions   Education Provided Provided Education  Provided Verbal Education On Blood Sugar Monitoring, Other, Medication  [Reviewed medication changes from hospital discharge, state he is feeling much better. Advised to monitor blood pressure, heart rate, and blood sugar daily and record readings to provide trends to doctor]       Follow up plan: Follow up call scheduled for 7/25    Encounter Outcome:  Pt. Visit Completed   Kemper Durie [facility] Care Management Care Management Coordinator [phone]

## 2023-02-21 ENCOUNTER — Ambulatory Visit: Payer: Self-pay

## 2023-02-21 ENCOUNTER — Ambulatory Visit (INDEPENDENT_AMBULATORY_CARE_PROVIDER_SITE_OTHER): Payer: PPO | Admitting: Family Medicine

## 2023-02-21 ENCOUNTER — Encounter: Payer: Self-pay | Admitting: Family Medicine

## 2023-02-21 VITALS — BP 108/66 | HR 71 | Resp 16 | Ht 71.0 in | Wt 143.0 lb

## 2023-02-21 DIAGNOSIS — R600 Localized edema: Secondary | ICD-10-CM | POA: Diagnosis not present

## 2023-02-21 MED ORDER — POTASSIUM CHLORIDE CRYS ER 20 MEQ PO TBCR
20.0000 meq | EXTENDED_RELEASE_TABLET | Freq: Every day | ORAL | 0 refills | Status: DC
Start: 2023-02-21 — End: 2024-02-20

## 2023-02-21 MED ORDER — TORSEMIDE 20 MG PO TABS
20.0000 mg | ORAL_TABLET | Freq: Every day | ORAL | 0 refills | Status: DC
Start: 2023-02-21 — End: 2024-02-20

## 2023-02-21 NOTE — Telephone Encounter (Signed)
Appt sch'd for 3:40 with Dr Carlynn Purl today

## 2023-02-21 NOTE — Telephone Encounter (Signed)
Swelling in both legs, turned a scarlett red in color with pain. Walking is difficult. Please f/u with patient   Chief Complaint: Pt. Reports leg swelling started again Sunday, is now up above knees. Has redness to both shins. Pain with walking.  Symptoms: Above Frequency: Sunday Pertinent Negatives: Patient denies chest pain Disposition: [] ED /[] Urgent Care (no appt availability in office) / [] Appointment(In office/virtual)/ []  [facility]/ [] Home Care/ [] Refused Recommended Disposition /[] [facility]/ [x]  Follow-up with PCP Additional Notes: No availability. Rhonda in the practice will speak with provider for disposition. Pt. Verbalizes understanding.  Reason for Disposition  [1] Red area or streak [2] large (> 2 in. or 5 cm)  Answer Assessment - Initial Assessment Questions 1. ONSET: "When did the swelling start?" (e.g., minutes, hours, days)     Sunday 2. LOCATION: "What part of the leg is swollen?"  "Are both legs swollen or just one leg?"     Both legs 3. SEVERITY: "How bad is the swelling?" (e.g., localized; mild, moderate, severe)   - Localized: Small area of swelling localized to one leg.   - MILD pedal edema: Swelling limited to foot and ankle, pitting edema < 1/4 inch (6 mm) deep, rest and elevation eliminate most or all swelling.   - MODERATE edema: Swelling of lower leg to knee, pitting edema > 1/4 inch (6 mm) deep, rest and elevation only partially reduce swelling.   - SEVERE edema: Swelling extends above knee, facial or hand swelling present.      Moderate 4. REDNESS: "Does the swelling look red or infected?"     Yes, at shins 5. PAIN: "Is the swelling painful to touch?" If Yes, ask: "How painful is it?"   (Scale 1-10; mild, moderate or severe)     6-7 6. FEVER: "Do you have a fever?" If Yes, ask: "What is it, how was it measured, and when did it start?"      No 7. CAUSE: "What do you think is causing the leg swelling?"     Heart issues 8. MEDICAL  HISTORY: "Do you have a history of blood clots (e.g., DVT), cancer, heart failure, kidney disease, or liver failure?"     Yes 9. RECURRENT SYMPTOM: "Have you had leg swelling before?" If Yes, ask: "When was the last time?" "What happened that time?"     Yes 10. OTHER SYMPTOMS: "Do you have any other symptoms?" (e.g., chest pain, difficulty breathing)       No 11. PREGNANCY: "Is there any chance you are pregnant?" "When was your last menstrual period?"       N/a  Protocols used: Leg Swelling and Edema-A-AH

## 2023-02-21 NOTE — Progress Notes (Signed)
Name: Mark Nash   MRN: [id number]    DOB: 1956/01/10   Date:02/21/2023       Progress Note  Subjective  Chief Complaint  Leg Swelling  HPI  He has recurrently lower extremity edema and erythema of both legs. He states skin has not really changed this time, but the swelling is going up above his knees and legs feels tight. He has seen [facility]. He denies fever or chills. He denies orthopnea or increase in SOB   Patient Active Problem List   Diagnosis Date Noted   HFrEF (heart failure with reduced ejection fraction) (HCC) 01/30/2023   Acute on chronic combined systolic and diastolic CHF (congestive heart failure) (HCC) 01/28/2023   Pressure injury of skin 01/28/2023   Chronic atrial flutter (HCC) 01/26/2023   Acute systolic heart failure (HCC) 01/26/2023   Acute respiratory failure with hypoxia (HCC) 01/24/2023   Rhinovirus infection 01/23/2023   COPD exacerbation (HCC) 01/21/2023   Senile purpura (HCC) 11/15/2022   Chronic a-fib (HCC) 03/15/2022   Type 2 diabetes mellitus with diabetic polyneuropathy, with long-term current use of insulin (HCC) 03/16/2021   DDD (degenerative disc disease), lumbar 03/06/2019   Primary osteoarthritis involving multiple joints 03/06/2019   Lumbar radiculopathy 11/05/2018   Lumbar post-laminectomy syndrome 11/05/2018   Diabetic polyneuropathy associated with type 2 diabetes mellitus (HCC) 11/05/2018   CLE (columnar lined esophagus)    Gastric irritation    Duodenum ulcer    Personal history of colonic polyps    Polyp of sigmoid colon    Diverticulosis of large intestine without diverticulitis    Benign neoplasm of ascending colon    Osteoarthritis of both hands 09/04/2017   Chronic pain of left knee 09/04/2017   Lymphedema 07/07/2016   Chronic hepatitis C without hepatic coma (HCC) 06/01/2016   Lumbar herniated disc 11/24/2015   Thrombocytopenia (HCC) 11/11/2015   Hepatitis C antibody test positive 07/13/2015   Chronic  constipation 07/13/2015   CN (constipation) 07/13/2015   Diabetes mellitus with coincident hypertension (HCC) 03/08/2015   Dyslipidemia 03/08/2015   Paranoid schizophrenia (HCC) 03/08/2015   Restless leg 03/08/2015   Callus of foot 03/08/2015   Claudication (HCC) 03/08/2015   Chronic obstructive pulmonary disease (HCC) 03/08/2015   Corn or callus 03/08/2015   Essential (primary) hypertension 03/08/2015   HLD (hyperlipidemia) 03/08/2015   Peripheral vascular disease (HCC) 03/08/2015   Polyneuropathy 03/08/2015   Current tobacco use 03/08/2015   Vitamin D deficiency 04/26/2009   Avitaminosis D 04/26/2009    Past Surgical History:  Procedure Laterality Date   BACK SURGERY     CARDIOVERSION N/A 05/20/2021   Procedure: CARDIOVERSION;  Surgeon: Iran Ouch, MD;  Location: [facility];  Service: Cardiovascular;  Laterality: N/A;   CATARACT EXTRACTION W/PHACO Right 10/31/2021   Procedure: CATARACT EXTRACTION PHACO AND INTRAOCULAR LENS PLACEMENT (IOC) RIGHT DIABETIC;  Surgeon: Nevada Crane, MD;  Location: [facility];  Service: Ophthalmology;  Laterality: Right;  Diabetic 4.34 00:42.8   COLONOSCOPY WITH PROPOFOL N/A 09/07/2017   Procedure: COLONOSCOPY WITH PROPOFOL;  Surgeon: Pasty Spillers, MD;  Location: [facility];  Service: Endoscopy;  Laterality: N/A;   ESOPHAGOGASTRODUODENOSCOPY (EGD) WITH PROPOFOL N/A 12/07/2017   Procedure: ESOPHAGOGASTRODUODENOSCOPY (EGD) WITH PROPOFOL;  Surgeon: Pasty Spillers, MD;  Location: [facility];  Service: Endoscopy;  Laterality: N/A;   EYE SURGERY     FLEXIBLE SIGMOIDOSCOPY N/A 11/09/2017   Procedure: FLEXIBLE SIGMOIDOSCOPY;  Surgeon: Pasty Spillers, MD;  Location: [facility];  Service:  Endoscopy;  Laterality: N/A;   HERNIA REPAIR     inguinal   INGUINAL HERNIA REPAIR Bilateral 06/21/2018   Procedure: LAPAROSCOPIC BILATERAL INGUINAL HERNIA REPAIR;  Surgeon: Ancil Linsey, MD;  Location: [facility];   Service: General;  Laterality: Bilateral;   L3 TO L5 LAMINECTOMY FOR DECOMPRESSION  07/17/2016   [facility]   REPAIR OF CEREBROSPINAL FLUID LEAK N/A 09/08/2016   Procedure: Lumbar wound exploration, repair of pseudomenigocele;  Surgeon: Loura Halt Ditty, MD;  Location: [facility];  Service: Neurosurgery;  Laterality: N/A;  Lumbar wound exploration, repair of pseudomenigocele   RIGHT/LEFT HEART CATH AND CORONARY ANGIOGRAPHY N/A 01/29/2023   Procedure: RIGHT/LEFT HEART CATH AND CORONARY ANGIOGRAPHY;  Surgeon: Iran Ouch, MD;  Location: [facility];  Service: Cardiovascular;  Laterality: N/A;   SINUSOTOMY      Family History  Problem Relation Age of Onset   Heart failure Mother    Hypertension Mother    Diverticulitis Mother    Colitis Mother    Diabetes Father    Hypertension Father    Heart disease Father    Diabetes Sister    Heart failure Brother    Diverticulitis Brother     Social History   Tobacco Use   Smoking status: Every Day    Packs/day: 1.50    Years: 46.00    Additional pack years: 0.00    Total pack years: 69.00    Types: Cigarettes   Smokeless tobacco: Never  Substance Use Topics   Alcohol use: No    Alcohol/week: 0.0 standard drinks of alcohol     Current Outpatient Medications:    apixaban (ELIQUIS) 5 MG TABS tablet, Take 1 tablet by mouth twice daily, Disp: 60 tablet, Rfl: 5   blood glucose meter kit and supplies KIT, Dispense based on patient and insurance preference. Use up to tid E11.65, LON:99, Disp: 1 each, Rfl: 0   Budeson-Glycopyrrol-Formoterol (BREZTRI AEROSPHERE) 160-9-4.8 MCG/ACT AERO, Inhale 2 puffs into the lungs 2 (two) times daily. Patient receives via [employer] Patient Assistance., Disp: 32.1 g, Rfl: 3   dapagliflozin propanediol (FARXIGA) 10 MG TABS tablet, Take 1 tablet (10 mg total) by mouth daily., Disp: 90 tablet, Rfl: 3   digoxin (LANOXIN) 0.25 MG tablet, Take 1 tablet (0.25 mg total) by mouth daily.,  Disp: 30 tablet, Rfl: 5   gabapentin (NEURONTIN) 300 MG capsule, Take 300-600 mg by mouth See admin instructions. Take 1 capsule (300 mg) by mouth in the morning, take 1 capsule (300 mg) by mouth at noon, and 2 capsules (600 mg) by mouth in the evening, Disp: , Rfl:    Lancet Devices MISC, 1 each by Does not apply route 3 (three) times daily as needed. What ins will cover dx:E11.65, LON:99 tid, Disp: 100 each, Rfl: 12   LEVEMIR 100 UNIT/ML injection, INJECT 8 UNITS SUBCUTANEOUSLY AT BEDTIME, Disp: 10 mL, Rfl: 0   losartan (COZAAR) 25 MG tablet, Take 1 tablet (25 mg total) by mouth daily. Skip the dose if systolic BP less than 110 mmHg, Disp: 30 tablet, Rfl: 5   metoprolol succinate (TOPROL-XL) 200 MG 24 hr tablet, Take 1 tablet (200 mg total) by mouth daily. Take with or immediately following a meal., Disp: 30 tablet, Rfl: 11   pravastatin (PRAVACHOL) 20 MG tablet, Take 1 tablet (20 mg total) by mouth daily with supper., Disp: 90 tablet, Rfl: 3   tiZANidine (ZANAFLEX) 4 MG tablet, Take 4 mg by mouth every 12 (twelve) hours.,  Disp: , Rfl:    traMADol (ULTRAM) 50 MG tablet, Take 50 mg by mouth every 12 (twelve) hours., Disp: , Rfl:    Vitamin D, Ergocalciferol, (DRISDOL) 1.25 MG (50000 UNIT) CAPS capsule, TAKE 1 CAPSULE BY MOUTH EVERY TWO WEEKS, Disp: 6 capsule, Rfl: 0  Allergies  Allergen Reactions   Penicillins Hives and Swelling    As a child. Has patient had a PCN reaction causing immediate rash, facial/tongue/throat swelling, SOB or lightheadedness with hypotension: YES  Has patient had a PCN reaction causing severe rash involving mucus membranes or skin necrosis: UNKNOWN Has patient had a PCN reaction that required hospitalization: UNKNOWN Has patient had a PCN reaction occurring within the last 10 years: NO   Aspirin Nausea Only   Ibuprofen Nausea Only   Lyrica [Pregabalin] Nausea Only   Acetaminophen Nausea Only    I personally reviewed active problem list, medication list, allergies,  family history, social history, health maintenance with the patient/caregiver today.   ROS  Ten systems reviewed and is negative except as mentioned in HPI   Objective  Vitals:   02/21/23 1532  BP: 108/66  Pulse: 71  Resp: 16  SpO2: 93%  Weight: 143 lb (64.9 kg)  Height: 5\' 11"  (1.803 m)    Body mass index is 19.94 kg/m.  Physical Exam  Constitutional: Patient appears well-developed and well-nourished.  No distress.  HEENT: head atraumatic, normocephalic, pupils equal and reactive to light, neck supple Cardiovascular: Normal rate, regular rhythm and normal heart sounds.  No murmur heard. BLE edema, trace of pitting but mostly non pitting, up to knee. Erythema likely from stasis dermatitis on both lower legs  Pulmonary/Chest: Effort normal and breath sounds normal. No respiratory distress. Abdominal: Soft.  There is no tenderness. Psychiatric: Patient has a normal mood and affect. behavior is normal. Judgment and thought content normal.    PHQ2/9:    02/21/2023    3:31 PM 02/07/2023    2:38 PM 12/27/2022    3:18 PM 12/18/2022    2:07 PM 12/11/2022    3:07 PM  Depression screen PHQ 2/9  Decreased Interest 0 0 0 0 0  Down, Depressed, Hopeless 0 0 0 0 0  PHQ - 2 Score 0 0 0 0 0  Altered sleeping    0 0  Tired, decreased energy    1 0  Change in appetite    0 0  Feeling bad or failure about yourself     0 0  Trouble concentrating    0 0  Moving slowly or fidgety/restless    0 0  Suicidal thoughts    0 0  PHQ-9 Score    1 0  Difficult doing work/chores    Not difficult at all Not difficult at all    phq 9 is negative   Fall Risk:    02/21/2023    3:31 PM 02/07/2023    2:38 PM 12/27/2022    3:18 PM 12/18/2022    2:06 PM 12/11/2022    3:04 PM  Fall Risk   Falls in the past year? 0 0 0 0 0  Number falls in past yr: 0 0 0  0  Injury with Fall? 0 0 0  0  Risk for fall due to :  Impaired balance/gait No Fall Risks No Fall Risks   Follow up Falls prevention discussed  Falls prevention discussed Falls prevention discussed Falls prevention discussed;Education provided;Falls evaluation completed       Functional Status Survey: Is  the patient deaf or have difficulty hearing?: No Does the patient have difficulty seeing, even when wearing glasses/contacts?: No Does the patient have difficulty concentrating, remembering, or making decisions?: No Does the patient have difficulty walking or climbing stairs?: Yes Does the patient have difficulty dressing or bathing?: Yes Does the patient have difficulty doing errands alone such as visiting a doctor's office or shopping?: Yes    Assessment & Plan  1. Leg edema  - torsemide (DEMADEX) 20 MG tablet; Take 1 tablet (20 mg total) by mouth daily before breakfast. With potassium pill and hold losartan when you take this medication  Dispense: 30 tablet; Refill: 0 - potassium chloride SA (KLOR-CON M) 20 MEQ tablet; Take 1 tablet (20 mEq total) by mouth daily.  Dispense: 30 tablet; Refill: 0   Explained the needs to see vascular surgeon, he will contact them today or tomorrow to schedule a sooner visit. Take demadex with potassium for at most 3 days and prn after that

## 2023-02-26 NOTE — Progress Notes (Unsigned)
Office Visit    Patient Name: Mark Nash Date of Encounter: 02/27/2023  Primary Care Provider:  Alba Cory, MD Primary Cardiologist:  Julien Nordmann, MD  Chief Complaint  67 year old male with past medical history of hypertension, hyperlipidemia, type 2 diabetes mellitus, CAD, COPD/asthma, persistent A-fib/flutter on Eliquis, HFrEF, hep C, schizoaffective/schizophrenia disorder, tobacco use. He presents today for hospital follow up regarding his heart failure.  Past Medical History    Past Medical History:  Diagnosis Date   Arthritis    Asthma    Benign neoplasm of ascending colon    polyps   Constipation    COPD (chronic obstructive pulmonary disease) (HCC)    NO INHALER   Diabetes mellitus without complication (HCC)    Type 2   Diverticulosis    Dyspnea    RARE-WITH EXERTION DUE TO COPD   GERD (gastroesophageal reflux disease)    Hepatitis C    History of kidney stones    H/O   Hyperlipidemia    Hypertension 06/10/2018   currently not under control   Left inguinal hernia    Peripheral vascular disease (HCC)    Restless legs    Schizoaffective disorder (HCC)    Pt denies   Thrombocytopenia (HCC)    Vitamin D deficiency    Wears dentures    full upper and lower   Past Surgical History:  Procedure Laterality Date   BACK SURGERY     CARDIOVERSION N/A 05/20/2021   Procedure: CARDIOVERSION;  Surgeon: Iran Ouch, MD;  Location: [facility];  Service: Cardiovascular;  Laterality: N/A;   CATARACT EXTRACTION W/PHACO Right 10/31/2021   Procedure: CATARACT EXTRACTION PHACO AND INTRAOCULAR LENS PLACEMENT (IOC) RIGHT DIABETIC;  Surgeon: Nevada Crane, MD;  Location: [facility];  Service: Ophthalmology;  Laterality: Right;  Diabetic 4.34 00:42.8   COLONOSCOPY WITH PROPOFOL N/A 09/07/2017   Procedure: COLONOSCOPY WITH PROPOFOL;  Surgeon: Pasty Spillers, MD;  Location: [facility];  Service: Endoscopy;  Laterality: N/A;    ESOPHAGOGASTRODUODENOSCOPY (EGD) WITH PROPOFOL N/A 12/07/2017   Procedure: ESOPHAGOGASTRODUODENOSCOPY (EGD) WITH PROPOFOL;  Surgeon: Pasty Spillers, MD;  Location: [facility];  Service: Endoscopy;  Laterality: N/A;   EYE SURGERY     FLEXIBLE SIGMOIDOSCOPY N/A 11/09/2017   Procedure: FLEXIBLE SIGMOIDOSCOPY;  Surgeon: Pasty Spillers, MD;  Location: [facility];  Service: Endoscopy;  Laterality: N/A;   HERNIA REPAIR     inguinal   INGUINAL HERNIA REPAIR Bilateral 06/21/2018   Procedure: LAPAROSCOPIC BILATERAL INGUINAL HERNIA REPAIR;  Surgeon: Ancil Linsey, MD;  Location: [facility];  Service: General;  Laterality: Bilateral;   L3 TO L5 LAMINECTOMY FOR DECOMPRESSION  07/17/2016   [facility]   REPAIR OF CEREBROSPINAL FLUID LEAK N/A 09/08/2016   Procedure: Lumbar wound exploration, repair of pseudomenigocele;  Surgeon: Loura Halt Ditty, MD;  Location: [facility];  Service: Neurosurgery;  Laterality: N/A;  Lumbar wound exploration, repair of pseudomenigocele   RIGHT/LEFT HEART CATH AND CORONARY ANGIOGRAPHY N/A 01/29/2023   Procedure: RIGHT/LEFT HEART CATH AND CORONARY ANGIOGRAPHY;  Surgeon: Iran Ouch, MD;  Location: [facility];  Service: Cardiovascular;  Laterality: N/A;   SINUSOTOMY     Allergies Allergies  Allergen Reactions   Penicillins Hives and Swelling    As a child. Has patient had a PCN reaction causing immediate rash, facial/tongue/throat swelling, SOB or lightheadedness with hypotension: YES  Has patient had a PCN reaction causing severe rash involving mucus membranes or skin necrosis: UNKNOWN Has  patient had a PCN reaction that required hospitalization: UNKNOWN Has patient had a PCN reaction occurring within the last 10 years: NO   Aspirin Nausea Only   Ibuprofen Nausea Only   Lyrica [Pregabalin] Nausea Only   Acetaminophen Nausea Only   Labs/Other Studies Reviewed    The following studies were reviewed today: Cardiac  Studies & Procedures   CARDIAC CATHETERIZATION  CARDIAC CATHETERIZATION 01/29/2023  Narrative 1.  Mildly calcified coronary arteries with mild nonobstructive coronary artery disease. 2.  Left ventricular angiography was not performed.  EF was moderately reduced by echo. 3.  Right heart catheterization showed normal filling pressures, mild pulmonary hypertension and normal cardiac output.  Recommendations: The patient has nonischemic cardiomyopathy likely tachycardia induced.  Recommend medical therapy. Continue Toprol for anticoagulation. Resume heparin 2 hours after sheath pull and likely can transition to Eliquis later this evening.  Findings Coronary Findings Diagnostic  Dominance: Right  Left Main Vessel is angiographically normal.  Left Anterior Descending There is mild diffuse disease throughout the vessel.  First Diagonal Branch Vessel is angiographically normal.  Second Diagonal Branch Vessel is angiographically normal.  Third Diagonal Branch Vessel is angiographically normal.  Left Circumflex There is mild diffuse disease throughout the vessel.  First Obtuse Marginal Branch Vessel is small in size. Vessel is angiographically normal.  Second Obtuse Marginal Branch Vessel is large in size. Vessel is angiographically normal.  Third Obtuse Marginal Branch Vessel is angiographically normal.  Right Coronary Artery There is mild diffuse disease throughout the vessel.  Right Posterior Descending Artery Vessel is angiographically normal.  Right Posterior Atrioventricular Artery Vessel is small in size.  Intervention  No interventions have been documented.   STRESS TESTS  NM MYOCAR MULTI W/SPECT W 01/02/2023  Narrative   Sensitivity and specificity of the study are significant reduced due to artifact.  The resting images are uninterpretable due to severe extracardiac activity.   Abnormal, probably low risk pharmacologic myocardial.   There is a small in  size, mild in severity, fixed apical anterior and apical defect most consistent with artifact but cannot rule out an element of scar.   There is no evidence of significant ischemia.   Left ventricular systolic function is moderately reduced (LVEF 35-45%).   Coronary artery calcification and aortic atherosclerosis are present.   No significant change from prior study on 05/12/2021 allowing for differences in artifact.   ECHOCARDIOGRAM  ECHOCARDIOGRAM LIMITED 01/26/2023  Narrative ECHOCARDIOGRAM LIMITED REPORT    Patient Name:   Mark Nash Date of Exam: 01/26/2023 Medical Rec #:  [id number]       Height:       71.0 in Accession #:    [id number]      Weight:       141.8 lb Date of Birth:  December 14, 1955       BSA:          1.822 m Patient Age:    66 years        BP:           139/87 mmHg Patient Gender: M               HR:           109 bpm. Exam Location:  [facility]  Procedure: Limited Echo  Indications:     CHF  History:         Patient has prior history of Echocardiogram examinations, most recent 09/11/2022. CHF, COPD, Arrythmias:Atrial Fibrillation, Signs/Symptoms:Shortness of Breath; Risk Factors:Hypertension, Diabetes, Dyslipidemia  and Current Smoker. Definity was not used due to poor windows.  Sonographer:     Mikki Harbor Referring Phys:  [id number] CADENCE H FURTH Diagnosing Phys: Lorine Bears MD   Sonographer Comments: Technically challenging study due to limited acoustic windows, no apical window and suboptimal parasternal window. IMPRESSIONS   1. Left ventricular ejection fraction, by estimation, is 30 to 35%. The left ventricle has moderately decreased function. Left ventricular endocardial border not optimally defined to evaluate regional wall motion. 2. Right ventricular systolic function is normal. The right ventricular size is normal. 3. Left atrial size was moderately dilated. 4. The mitral valve is normal in structure. No evidence of mitral valve regurgitation.  No evidence of mitral stenosis. 5. The aortic valve is calcified. Aortic valve regurgitation is not visualized. No aortic stenosis is present. 6. Aortic dilatation noted. There is mild dilatation of the aortic root, measuring 41 mm. 7. Challenging image quality. Most valves not well seen.  FINDINGS Left Ventricle: Left ventricular ejection fraction, by estimation, is 30 to 35%. The left ventricle has moderately decreased function. Left ventricular endocardial border not optimally defined to evaluate regional wall motion. The left ventricular internal cavity size was normal in size. There is no left ventricular hypertrophy.  Right Ventricle: The right ventricular size is normal. No increase in right ventricular wall thickness. Right ventricular systolic function is normal.  Left Atrium: Left atrial size was moderately dilated.  Right Atrium: Right atrial size was normal in size.  Pericardium: There is no evidence of pericardial effusion.  Mitral Valve: The mitral valve is normal in structure. There is mild thickening of the mitral valve leaflet(s). No evidence of mitral valve stenosis.  Tricuspid Valve: The tricuspid valve is normal in structure. Tricuspid valve regurgitation is not demonstrated. No evidence of tricuspid stenosis.  Aortic Valve: The aortic valve is calcified. Aortic valve regurgitation is not visualized. No aortic stenosis is present.  Pulmonic Valve: The pulmonic valve was normal in structure. Pulmonic valve regurgitation is not visualized. No evidence of pulmonic stenosis.  Aorta: Aortic dilatation noted. There is mild dilatation of the aortic root, measuring 41 mm.  Venous: The inferior vena cava was not well visualized.  IAS/Shunts: No atrial level shunt detected by color flow Doppler.  LEFT VENTRICLE PLAX 2D LVIDd:         4.90 cm LVIDs:         4.20 cm LV PW:         1.00 cm LV IVS:        0.90 cm LVOT diam:     2.30 cm LVOT Area:     4.15 cm   LEFT  ATRIUM         Index LA diam:    5.00 cm 2.74 cm/m  AORTA Ao Root diam: 4.10 cm   SHUNTS Systemic Diam: 2.30 cm  Lorine Bears MD Electronically signed by Lorine Bears MD Signature Date/Time: 01/26/2023/3:04:26 PM    Final            Recent Labs: 01/21/2023: B Natriuretic Peptide 19.7 01/27/2023: TSH 1.455 02/07/2023: ALT 34; BUN 16; Creat 0.84; Hemoglobin 12.4; Magnesium 2.2; Platelets 173; Potassium 3.9; Sodium 143  Recent Lipid Panel    Component Value Date/Time   CHOL 103 11/15/2022 1448   CHOL 112 07/13/2015 1018   TRIG 46 11/15/2022 1448   HDL 61 11/15/2022 1448   HDL 62 07/13/2015 1018   CHOLHDL 1.7 11/15/2022 1448   VLDL 12 12/15/2016 1405   LDLCALC  29 11/15/2022 1448   History of Present Illness  67 year old male with past medical history of hypertension, hyperlipidemia, type 2 diabetes mellitus, CAD, COPD/asthma, persistent A-fib/flutter on Eliquis, HFrEF, hep C, schizoaffective/schizophrenia disorder, tobacco use.  In July 2022 he was diagnosed with atrial fibrillation.  He underwent unsuccessful cardioversion in 04/2021.  He was previously scheduled for an ablation with EP in 2022 but this was canceled due to lower extremity wounds.  At later follow-up he was felt to not be a good candidate for ablation.  Previously been rate controlled on diltiazem and stroke prophylaxis with Eliquis.  Known history of HFmrEF.  In 06/2019 he had an echocardiogram that showed an EF of 55-60%.  Followed by an echo in 08/2022 that showed LVEF of 45 to 50%.  He had a Myoview Lexiscan 12/2022 that was a difficult read.  Overall abnormal with probably low risk possible artifact versus scar, no evidence of ischemia.  EF 35 to 45%, coronary artery calcification was present. Cardiomyopathy felt to be tachy-mediated in setting of atrial arrhythmia.   On 01/13/2023 he presented the ED for COPD exacerbation.  His EKG at that time showed rapid atrial flutter at 130 bpm.  He was given IV  metoprolol and home dose of Dilt.  He was treated with DuoNebs and prednisone, sent home with doxycycline.  He was admitted to [facility] on 01/21/2023 with worsening shortness of breath thought to be due to COPD exacerbation. He was diagnosed with rhinovirus infection and possible PNA.  His heart rate was difficult to control, rates in the 140s.  His echo while inpatient showed further reduced LVEF of 30 to 35%.  He subsequently underwent coronary angiography that showed mild nonobstructive coronary artery disease, right heart catheterization showed normal filling pressures.  While inpatient his ventricular rates remain suboptimally controlled, he was started on digoxin. He ws discharged on digoxin 0.25mg , eliquis 5mg  twice daily, losartan 25mg  daily, Farxiga 10mg  daily, Toprol 200mg  daily and pravastatin 20mg  daily.   On 02/21/23 he saw his primary care physician for lower extremity swelling. Per patient his legs had become swollen up past his thighs, denied worsened dyspnea. She started him on Torsemide 20mg  daily with potassium, instructed him to hold his Losartan while taking.   Today he reports he is doing well overall. States he is feeling a little weak today after taking his Losartan, he did not check his blood pressure this morning before taking. His lower extremity edema has resolved following use of torsemide. He feels his dyspnea and weakness has been improving since hospitalization discharge. He denies chest pain, orthopnea, n, v, syncope or early satiety. Reports he has not been checking his weights at home, does not have a scale.   Home Medications    Current Outpatient Medications  Medication Sig Dispense Refill   apixaban (ELIQUIS) 5 MG TABS tablet Take 1 tablet by mouth twice daily 60 tablet 5   blood glucose meter kit and supplies KIT Dispense based on patient and insurance preference. Use up to tid E11.65, LON:99 1 each 0   Budeson-Glycopyrrol-Formoterol (BREZTRI AEROSPHERE) 160-9-4.8  MCG/ACT AERO Inhale 2 puffs into the lungs 2 (two) times daily. Patient receives via [employer] Patient Assistance. 32.1 g 3   dapagliflozin propanediol (FARXIGA) 10 MG TABS tablet Take 1 tablet (10 mg total) by mouth daily. 90 tablet 3   digoxin (LANOXIN) 0.25 MG tablet Take 1 tablet (0.25 mg total) by mouth daily. 30 tablet 5   gabapentin (NEURONTIN) 300 MG capsule Take 300-600  mg by mouth See admin instructions. Take 1 capsule (300 mg) by mouth in the morning, take 1 capsule (300 mg) by mouth at noon, and 2 capsules (600 mg) by mouth in the evening     Lancet Devices MISC 1 each by Does not apply route 3 (three) times daily as needed. What ins will cover dx:E11.65, LON:99 tid 100 each 12   LEVEMIR 100 UNIT/ML injection INJECT 8 UNITS SUBCUTANEOUSLY AT BEDTIME 10 mL 0   metoprolol succinate (TOPROL-XL) 200 MG 24 hr tablet Take 1 tablet (200 mg total) by mouth daily. Take with or immediately following a meal. 30 tablet 11   pravastatin (PRAVACHOL) 20 MG tablet Take 1 tablet (20 mg total) by mouth daily with supper. 90 tablet 3   tiZANidine (ZANAFLEX) 4 MG tablet Take 4 mg by mouth every 12 (twelve) hours.     traMADol (ULTRAM) 50 MG tablet Take 50 mg by mouth every 12 (twelve) hours.     Vitamin D, Ergocalciferol, (DRISDOL) 1.25 MG (50000 UNIT) CAPS capsule TAKE 1 CAPSULE BY MOUTH EVERY TWO WEEKS 6 capsule 0   losartan (COZAAR) 25 MG tablet Take 0.5 tablets (12.5 mg total) by mouth daily. Skip the dose if systolic BP less than 110 mmHg 30 tablet 5   potassium chloride SA (KLOR-CON M) 20 MEQ tablet Take 1 tablet (20 mEq total) by mouth daily. (Patient not taking: Reported on 02/27/2023) 30 tablet 0   torsemide (DEMADEX) 20 MG tablet Take 1 tablet (20 mg total) by mouth daily before breakfast. With potassium pill and hold losartan when you take this medication (Patient not taking: Reported on 02/27/2023) 30 tablet 0   No current facility-administered medications for this visit.    Review of Systems  He  denies chest pain, palpitations, pnd, orthopnea, n, v, syncope, or early satiety. All other systems reviewed and are otherwise negative except as noted above.     Physical Exam    VS:  BP 100/64 (BP Location: Left Arm, Patient Position: Sitting, Cuff Size: Normal)   Pulse 64   Ht 5\' 11"  (1.803 m)   Wt 138 lb 3.2 oz (62.7 kg)   SpO2 95%   BMI 19.27 kg/m  , BMI Body mass index is 19.27 kg/m.     GEN: Well nourished, well developed, in no acute distress. HEENT: normal. Neck: Supple, no JVD, carotid bruits, or masses. Cardiac: Irregularly irregular, no murmurs, rubs, or gallops. No clubbing, cyanosis, edema.  Radials/DP/PT 2+ and equal bilaterally.  Respiratory:  Respirations regular and unlabored, clear to auscultation bilaterally. GI: Soft, nontender, nondistended, BS + x 4. MS: no deformity or atrophy. Skin: warm and dry, no rash. Psych: Normal affect.  Accessory Clinical Findings    ECG personally reviewed by me today - EKG Interpretation Date/Time:  Tuesday February 27 2023 13:51:39 EDT Ventricular Rate:  68 PR Interval:    QRS Duration:  80 QT Interval:  318 QTC Calculation: 338 R Axis:   47  Text Interpretation: Atrial flutter with 4:1 A-V conduction Confirmed by Reather Littler [phone]) on 02/27/2023 2:00:24 PM  - no acute changes.   Lab Results  Component Value Date   WBC 7.1 02/07/2023   HGB 12.4 (L) 02/07/2023   HCT 37.3 (L) 02/07/2023   MCV 92.3 02/07/2023   PLT 173 02/07/2023   Lab Results  Component Value Date   CREATININE 0.84 02/07/2023   BUN 16 02/07/2023   NA 143 02/07/2023   K 3.9 02/07/2023   CL 108  02/07/2023   CO2 27 02/07/2023   Lab Results  Component Value Date   ALT 34 02/07/2023   AST 26 02/07/2023   ALKPHOS 87 01/22/2023   BILITOT 0.7 02/07/2023   Lab Results  Component Value Date   CHOL 103 11/15/2022   HDL 61 11/15/2022   LDLCALC 29 11/15/2022   TRIG 46 11/15/2022   CHOLHDL 1.7 11/15/2022    Lab Results  Component Value Date   HGBA1C  7.4 (A) 11/15/2022   Assessment & Plan   HFrEF/lower extremity edema: In January 2024 his ejection fraction was 45 to 50%, then during hospital admission for COPD exacerbation on 01/26/23 EF had decreased to 30 to 35%.  Cardiac catheterization indicated nonischemic cardiomyopathy. His Cardizem was stopped in setting of decreased EF, resulting in difficulty controlling his atrial flutter. Discharge medications included digoxin 0.25mg , eliquis 5mg  twice daily, losartan 25mg  daily, Farxiga 10mg  daily, and Toprol 200mg  daily. Seen by PCP on 02/21/23 for lower extremity edema, started on Torsemide 20mg  for three days, then as needed. Per patient he has tried lasix in the past without much improvement in fluid status. Today he is euvolemic, possibly volume depleted. Noting some fatigue and weakness with low blood pressures. Reviewed ED precautions and to monitor for weight gain of 2-3lbs overnight or 5lbs in a week. Discussed salt and fluid restriction. He is currently on max tolerated GDMT of Farxiga, Toprol and Losartan. Relative hypotension precludes escalation of GDMT from ARB to ARNi, given relative hypotension will decrease losartan to 12.5mg  daily. Can consider adding low dose spironolactone at next visit dependent on fluid status and renal function. Plan for repeat limited echo in a few months, to be ordered at follow up. Check CBC, Bmet, digoxin level and albumin today. Continue Farxiga, digoxin, Toprol, Eliquis and pravastatin, Torsemide 20mg  daily as needed for swelling or weight gain.   Persistent Atrial fibrillation/flutter: Noted to be in atrial fibrillation in July, 2022. Previously has been evaluated by EP for catheter ablation was not felt to be a candidate. Failed cardioversion in the past. His diltiazem was discontinued during [facility] admission in May/June, 2024 in setting of decreased EF.  While inpatient he was started on metoprolol succinate and digoxin. Today he is in atrial flutter at 64bpm,  denies feelings of palpitations or fast heart rate. If unable to maintain rate control or repeat limited echo indicates no improvement in EF can consider referral back to EP for antiarrythmic therapy or AV node ablation with pacemaker placement. Continue Eliqius 5mg  twice daily, metoprolol succinate 200mg  daily and digoxin 0.25mg  daily. Check CBC, Bmet, digoxin level and albumin today.  CHA2DS2-VASc Score = 5 [CHF History: 1, HTN History: 1, Diabetes History: 1, Stroke History: 0, Vascular Disease History: 1, Age Score: 1, Gender Score: 0].  Therefore, the patient's annual risk of stroke is 7.2 %.      HTN: Blood pressure today 100/64, notes he did not check his blood pressure before taking his losartan this morning. He does endorse some weakness and fatigue, slight dizziness with fast position changes. Decrease Losartan to 12.5mg  daily.   Hyperlipidemia: Last lipid panel on 10/2022 indicates LDL of 29 and total cholesterol of 103. Followed by PCP. Continue pravastatin.   Type 2 diabetes: Last hemoglobin A1C was 7.4 on 11/15/22. Managed by PCP. Continue Farxiga.   Tobacco abuse: Reports he does continue to smoke, is working to quit. Encouraged cessation.   Disposition: Follow up with Dr. Mariah Milling or Eula Listen, PA in one month.  Rip Harbour, NP 02/27/2023, 3:29 PM

## 2023-02-27 ENCOUNTER — Other Ambulatory Visit
Admission: RE | Admit: 2023-02-27 | Discharge: 2023-02-27 | Disposition: A | Discharge status: Home / Self Care | Payer: PPO | Source: Ambulatory Visit | Attending: Cardiology | Admitting: Cardiology

## 2023-02-27 ENCOUNTER — Ambulatory Visit: Payer: PPO | Attending: Physician Assistant | Admitting: Cardiology

## 2023-02-27 ENCOUNTER — Encounter: Payer: Self-pay | Admitting: Physician Assistant

## 2023-02-27 VITALS — BP 100/64 | HR 64 | Ht 71.0 in | Wt 138.2 lb

## 2023-02-27 DIAGNOSIS — I2089 Other forms of angina pectoris: Secondary | ICD-10-CM | POA: Diagnosis not present

## 2023-02-27 DIAGNOSIS — I10 Essential (primary) hypertension: Secondary | ICD-10-CM

## 2023-02-27 DIAGNOSIS — I5020 Unspecified systolic (congestive) heart failure: Secondary | ICD-10-CM | POA: Insufficient documentation

## 2023-02-27 DIAGNOSIS — I4892 Unspecified atrial flutter: Secondary | ICD-10-CM | POA: Diagnosis not present

## 2023-02-27 DIAGNOSIS — I4819 Other persistent atrial fibrillation: Secondary | ICD-10-CM | POA: Diagnosis not present

## 2023-02-27 DIAGNOSIS — R0602 Shortness of breath: Secondary | ICD-10-CM | POA: Diagnosis not present

## 2023-02-27 DIAGNOSIS — I482 Chronic atrial fibrillation, unspecified: Secondary | ICD-10-CM

## 2023-02-27 DIAGNOSIS — R079 Chest pain, unspecified: Secondary | ICD-10-CM

## 2023-02-27 LAB — DIGOXIN LEVEL: Digoxin Level: 1.7 ng/mL (ref 0.8–2.0)

## 2023-02-27 LAB — BASIC METABOLIC PANEL
Anion gap: 10 (ref 5–15)
BUN: 28 mg/dL — ABNORMAL HIGH (ref 8–23)
CO2: 29 mmol/L (ref 22–32)
Calcium: 9 mg/dL (ref 8.9–10.3)
Chloride: 95 mmol/L — ABNORMAL LOW (ref 98–111)
Creatinine, Ser: 1.1 mg/dL (ref 0.61–1.24)
GFR, Estimated: >60 mL/min (ref >60)
Glucose, Bld: 175 mg/dL — ABNORMAL HIGH (ref 70–99)
Potassium: 4.2 mmol/L (ref 3.5–5.1)
Sodium: 134 mmol/L — ABNORMAL LOW (ref 135–145)

## 2023-02-27 LAB — CBC
HCT: 39.9 % (ref 39.0–52.0)
Hemoglobin: 13.1 g/dL (ref 13.0–17.0)
MCH: 30.2 pg (ref 26.0–34.0)
MCHC: 32.8 g/dL (ref 30.0–36.0)
MCV: 91.9 fL (ref 80.0–100.0)
Platelets: 262 10*3/uL (ref 150–400)
RBC: 4.34 MIL/uL (ref 4.22–5.81)
RDW: 14.1 % (ref 11.5–15.5)
WBC: 7.8 10*3/uL (ref 4.0–10.5)
nRBC: 0 % (ref 0.0–0.2)

## 2023-02-27 LAB — ALBUMIN: Albumin: 4 g/dL (ref 3.5–5.0)

## 2023-02-27 MED ORDER — LOSARTAN POTASSIUM 25 MG PO TABS
12.5000 mg | ORAL_TABLET | Freq: Every day | ORAL | 5 refills | Status: DC
Start: 2023-02-27 — End: 2024-02-21

## 2023-02-27 NOTE — Patient Instructions (Signed)
Medication Instructions:  Decrease Losartan to 12.5 mg daily (do not take if systolic reading is less than 161)  *If you need a refill on your cardiac medications before your next appointment, please call your pharmacy*   Lab Work: Your provider would like for you to have following labs drawn: CBC, BMET, Albumin, Digoxin today.   Please go to the [facility] entrance and check in at the front desk.  You do not need an appointment.  They are open from 7am-6 pm.   If you have labs (blood work) drawn today and your tests are completely normal, you will receive your results only by: MyChart Message (if you have MyChart) OR A paper copy in the mail If you have any lab test that is abnormal or we need to change your treatment, we will call you to review the results.   Follow-Up: At [facility], you and your health needs are our priority.  As part of our continuing mission to provide you with exceptional heart care, we have created designated Provider Care Teams.  These Care Teams include your primary Cardiologist (physician) and Advanced Practice Providers (APPs -  Physician Assistants and Nurse Practitioners) who all work together to provide you with the care you need, when you need it.  We recommend signing up for the patient portal called "MyChart".  Sign up information is provided on this After Visit Summary.  MyChart is used to connect with patients for Virtual Visits (Telemedicine).  Patients are able to view lab/test results, encounter notes, upcoming appointments, etc.  Non-urgent messages can be sent to your provider as well.   To learn more about what you can do with MyChart, go to [email or web address].    Your next appointment:   1 month(s)  Provider:   You may see Julien Nordmann, MD or one of the following Advanced Practice Providers on your designated Care Team:   Nicolasa Ducking, NP Eula Listen, PA-C Cadence Fransico Michael, PA-C Charlsie Quest, NP

## 2023-03-02 ENCOUNTER — Telehealth: Payer: Self-pay | Admitting: Cardiology

## 2023-03-02 DIAGNOSIS — Z79899 Other long term (current) drug therapy: Secondary | ICD-10-CM

## 2023-03-02 DIAGNOSIS — I4892 Unspecified atrial flutter: Secondary | ICD-10-CM

## 2023-03-02 MED ORDER — DIGOXIN 250 MCG PO TABS: 0.1250 mg | ORAL_TABLET | Freq: Every day | ORAL | 5 refills | Status: DC

## 2023-03-02 NOTE — Telephone Encounter (Signed)
Darene Lamer, LPN 09/02/1094  0:45 PM EDT     Called patient, LVM to call back to discuss lab results. Left call back number.

## 2023-03-02 NOTE — Telephone Encounter (Signed)
Rip Harbour, NP 02/27/2023  4:29 PM EDT     Digoxin level is elevated, hold Digoxin for 3 days. Then reduce to 0.125mg  daily, recheck Bmet and Digoxin level 5 days after starting new dose.   CBC with no evidence of anemia nor infection.  Kidney function is normal and electrolyte levels are good.

## 2023-03-02 NOTE — Telephone Encounter (Signed)
I spoke with the patient regarding his lab results from 02/27/23. The patient was advised that per Reather Littler, NP he will need to: Hold digoxin x 3 days (he has not taken this today, so will hold today, Saturday, Sunday). On Monday 7/8, he will resume digoxin at 0.125 mg once daily. Repeat a BMP/ Digoxin level in ~ 5 days after starting the lower dose digoxin.  The patient voices understanding of the above and is agreeable.  He would like to try using his pill cutter to cut his digoxin 0.25 mg in 1/2. He will let us know if he has any problems with this.  He is also aware that repeat lab work will be [facility] Friday/ the following Monday and we will contact him with his follow up results once received.   The patient again voices understanding and was appreciative of the call today.  Lab orders have been placed.

## 2023-03-05 DIAGNOSIS — Z79891 Long term (current) use of opiate analgesic: Secondary | ICD-10-CM | POA: Diagnosis not present

## 2023-03-05 DIAGNOSIS — M545 Low back pain, unspecified: Secondary | ICD-10-CM | POA: Diagnosis not present

## 2023-03-05 DIAGNOSIS — G894 Chronic pain syndrome: Secondary | ICD-10-CM | POA: Diagnosis not present

## 2023-03-05 DIAGNOSIS — F1120 Opioid dependence, uncomplicated: Secondary | ICD-10-CM | POA: Diagnosis not present

## 2023-03-05 DIAGNOSIS — E119 Type 2 diabetes mellitus without complications: Secondary | ICD-10-CM | POA: Diagnosis not present

## 2023-03-05 DIAGNOSIS — Z720 Tobacco use: Secondary | ICD-10-CM | POA: Diagnosis not present

## 2023-03-05 DIAGNOSIS — I10 Essential (primary) hypertension: Secondary | ICD-10-CM | POA: Diagnosis not present

## 2023-03-05 DIAGNOSIS — M5416 Radiculopathy, lumbar region: Secondary | ICD-10-CM | POA: Diagnosis not present

## 2023-03-05 DIAGNOSIS — M5430 Sciatica, unspecified side: Secondary | ICD-10-CM | POA: Diagnosis not present

## 2023-03-05 DIAGNOSIS — M961 Postlaminectomy syndrome, not elsewhere classified: Secondary | ICD-10-CM | POA: Diagnosis not present

## 2023-03-05 DIAGNOSIS — Z716 Tobacco abuse counseling: Secondary | ICD-10-CM | POA: Diagnosis not present

## 2023-03-05 DIAGNOSIS — F209 Schizophrenia, unspecified: Secondary | ICD-10-CM | POA: Diagnosis not present

## 2023-03-12 ENCOUNTER — Other Ambulatory Visit
Admission: RE | Admit: 2023-03-12 | Discharge: 2023-03-12 | Disposition: A | Discharge status: Home / Self Care | Payer: PPO | Source: Ambulatory Visit | Attending: Cardiology | Admitting: Cardiology

## 2023-03-12 DIAGNOSIS — Z79899 Other long term (current) drug therapy: Secondary | ICD-10-CM | POA: Insufficient documentation

## 2023-03-12 DIAGNOSIS — I4892 Unspecified atrial flutter: Secondary | ICD-10-CM | POA: Insufficient documentation

## 2023-03-12 LAB — BASIC METABOLIC PANEL
Anion gap: 6 (ref 5–15)
BUN: 13 mg/dL (ref 8–23)
CO2: 27 mmol/L (ref 22–32)
Calcium: 8.8 mg/dL — ABNORMAL LOW (ref 8.9–10.3)
Chloride: 105 mmol/L (ref 98–111)
Creatinine, Ser: 0.78 mg/dL (ref 0.61–1.24)
GFR, Estimated: >60 mL/min (ref >60)
Glucose, Bld: 185 mg/dL — ABNORMAL HIGH (ref 70–99)
Potassium: 4.3 mmol/L (ref 3.5–5.1)
Sodium: 138 mmol/L (ref 135–145)

## 2023-03-12 LAB — DIGOXIN LEVEL: Digoxin Level: 0.8 ng/mL (ref 0.8–2.0)

## 2023-03-16 ENCOUNTER — Encounter: Payer: Self-pay | Admitting: Pharmacist

## 2023-03-16 NOTE — Progress Notes (Signed)
Patient previously followed by [facility] pharmacist. Per clinical review, no pharmacist appointment needed at this time and patient is currently followed by [facility]. Will make pharmacy patient advocate team aware of medication assistance for [name] and Breztri for re-enrollment for 2025.

## 2023-03-20 NOTE — Progress Notes (Unsigned)
Name: Mark Nash   MRN: [id number]    DOB: 10-Dec-1955   Date:03/21/2023       Progress Note  Subjective  Chief Complaint  Follow Up  HPI  Lumbar radiculitis: he had back surgery in 06/2016 and 08/2016. He is currently doing okay. Currently seeing  Dr. Welton Flakes and is taking , Tramadol, Tizanidine and higher dose gabapentin 300 mg morning, lunch and 2 at night, back pain is well controlled at this time 6/10 and stable    DMII: he is on Levemir but when it goes off the market we will change to [place] 8 units daily and Farxiga  His  A1C went up to 8.8 % down to 7.6%, 7.4 % and now is 7.5 %  . He is on ARB  , statins and gabapentin for neuropathy.  He also has PVD.  He has been hungry since discharged from [facility] in May 2023. He  has dry mouth and drinks water throughout the day  He is trying to eat healthier.   Malnutrition: baseline weight is 160 lbs, he went down to 130's while admitted in May, weight is gradually improving but still 16 lbs below normal. His appetite is good, trying to gain some weight , discussed protein intake   PAD: he had multiple episodes of cellulitis in 2022 and surgeon decided to hold off on surgical procedure , he is on medical management. He continues to have claudication but is better, able to walk for 20-30 minutes . He currently doing well and only using diabetic socks. He is up to date with follow ups with vascular surgeon . He is due to be seen in August    COPD Moderate : he always has daily morning  productive cough. He has SOB  with activity but doing better since taking maintenance medication,- Brezti  .  He is still smoking 1.5 - 2 packs daily, unable to quit smoking    Hyperlipidemia: taking Pravastatin 20 mg daily and denies myalgias. Last LDL was 29. Continue medications    HTN: taking Metoprolol and low dose Losartan  for afib and bp has been towards low end of normal, may need to go down on dose of beta blocker , he will discuss it with cardiologist     Schizophrenia/paranoid type : denies any hallucinations, no depressed mood or suicidal thoughts or ideation, not on medication and refuses to see Psychiatrist. He states he helps his family.  Mother died in 02/28/23 and brother died 04/15/23he still has one sister. He is doing okay, gaining weight since hospital discharge in May    Hepatitis C antibody positive: he has a remote history of drug use from age 26 till 69. Used injectable drug use at the time, he has positive hepatitis C , he went to see GI but did not discuss hepatitis C. He has seen GI, he has a large polyp removed 2019 and was supposed to go back for repeat colonoscopy but he keeps postponing . Unchanged    Chronic Constipation: he states bowel movements have improvement   He had EGD and colonoscopy in 2019 , past due for repeat testing secondary to large sigmoid polyp,he had covid, followed by taking care of mother, brother and heart ablation, advised him to go back to have colonoscopy now - he still has not been able to schedule the procedure , he states he needs to take care of his eye first    Senile purpura: both arms. Moderate  and stable   Afib/CHF: CHADVasc score of 4, he is under the care of cardiologist  ,he is now on metoprolol XL since admission in May 2023 for admission of heart failure ( combined type) , he is also on SGL-2 agonist , digoxin, he has been on Eliquis since diagnosed with Afib years ago . He denies side effects   Rash both legs: also history of cellulitis , he will see dermatologist    Patient Active Problem List   Diagnosis Date Noted   HFrEF (heart failure with reduced ejection fraction) (HCC) 01/30/2023   Chronic combined systolic and diastolic congestive heart failure (HCC) 01/28/2023   Pressure injury of skin 01/28/2023   Chronic atrial flutter (HCC) 01/26/2023   Acute systolic heart failure (HCC) 01/26/2023   Acute respiratory failure with hypoxia (HCC) 01/24/2023   Rhinovirus infection  01/23/2023   COPD exacerbation (HCC) 01/21/2023   Senile purpura (HCC) 11/15/2022   Chronic a-fib (HCC) 03/15/2022   Type 2 diabetes mellitus with diabetic polyneuropathy, with long-term current use of insulin (HCC) 03/16/2021   DDD (degenerative disc disease), lumbar 03/06/2019   Primary osteoarthritis involving multiple joints 03/06/2019   Lumbar radiculopathy 11/05/2018   Lumbar post-laminectomy syndrome 11/05/2018   Diabetic polyneuropathy associated with type 2 diabetes mellitus (HCC) 11/05/2018   CLE (columnar lined esophagus)    Gastric irritation    Duodenum ulcer    Personal history of colonic polyps    Polyp of sigmoid colon    Diverticulosis of large intestine without diverticulitis    Benign neoplasm of ascending colon    Osteoarthritis of both hands 09/04/2017   Chronic pain of left knee 09/04/2017   Lymphedema 07/07/2016   Chronic hepatitis C without hepatic coma (HCC) 06/01/2016   Lumbar herniated disc 11/24/2015   Thrombocytopenia (HCC) 11/11/2015   Hepatitis C antibody test positive 07/13/2015   Chronic constipation 07/13/2015   CN (constipation) 07/13/2015   Diabetes mellitus with coincident hypertension (HCC) 03/08/2015   Dyslipidemia 03/08/2015   Paranoid schizophrenia (HCC) 03/08/2015   Restless leg 03/08/2015   Callus of foot 03/08/2015   Claudication (HCC) 03/08/2015   Chronic obstructive pulmonary disease (HCC) 03/08/2015   Corn or callus 03/08/2015   Essential (primary) hypertension 03/08/2015   HLD (hyperlipidemia) 03/08/2015   Peripheral vascular disease (HCC) 03/08/2015   Polyneuropathy 03/08/2015   Current tobacco use 03/08/2015   Vitamin D deficiency 04/26/2009   Avitaminosis D 04/26/2009    Past Surgical History:  Procedure Laterality Date   BACK SURGERY     CARDIOVERSION N/A 05/20/2021   Procedure: CARDIOVERSION;  Surgeon: Iran Ouch, MD;  Location: [facility] ORS;  Service: Cardiovascular;  Laterality: N/A;   CATARACT EXTRACTION  W/PHACO Right 10/31/2021   Procedure: CATARACT EXTRACTION PHACO AND INTRAOCULAR LENS PLACEMENT (IOC) RIGHT DIABETIC;  Surgeon: Nevada Crane, MD;  Location: [facility];  Service: Ophthalmology;  Laterality: Right;  Diabetic 4.34 00:42.8   COLONOSCOPY WITH PROPOFOL N/A 09/07/2017   Procedure: COLONOSCOPY WITH PROPOFOL;  Surgeon: Pasty Spillers, MD;  Location: [facility] END[facility];  Service: Endoscopy;  Laterality: N/A;   ESOPHAGOGASTRODUODENOSCOPY (EGD) WITH PROPOFOL N/A 12/07/2017   Procedure: ESOPHAGOGASTRODUODENOSCOPY (EGD) WITH PROPOFOL;  Surgeon: Pasty Spillers, MD;  Location: [facility] ENDOSCOPY;  Service: Endoscopy;  Laterality: N/A;   EYE SURGERY     FLEXIBLE SIGMOIDOSCOPY N/A 11/09/2017   Procedure: FLEXIBLE SIGMOIDOSCOPY;  Surgeon: Pasty Spillers, MD;  Location: [facility] END[facility]COPY;  Service: Endoscopy;  Laterality: N/A;   HERNIA REPAIR  inguinal   INGUINAL HERNIA REPAIR Bilateral 06/21/2018   Procedure: LAPAROSCOPIC BILATERAL INGUINAL HERNIA REPAIR;  Surgeon: Ancil Linsey, MD;  Location: [facility] OR[facility];  Service: General;  Laterality: Bilateral;   L3 TO L5 LAMINECTOMY FOR DECOMPRESSION  07/17/2016   [facility]   REPAIR OF CEREBROSPINAL FLUID LEAK N/A 09/08/2016   Procedure: Lumbar wound exploration, repair of pseudomenigocele;  Surgeon: Loura Halt Ditty, MD;  Location: [facility];  Service: Neurosurgery;  Laterality: N/A;  Lumbar wound exploration, repair of pseudomenigocele   RIGHT/LEFT HEART CATH AND CORONARY ANGIOGRAPHY N/A 01/29/2023   Procedure: RIGHT/LEFT HEART CATH AND CORONARY ANGIOGRAPHY;  Surgeon: Iran Ouch, MD;  Location: [facility];  Service: Cardiovascular;  Laterality: N/A;   SINUSOTOMY      Family History  Problem Relation Age of Onset   Heart failure Mother    Hypertension Mother    Diverticulitis Mother    Colitis Mother    Diabetes Father    Hypertension Father    Heart disease Father    Diabetes Sister     Heart failure Brother    Diverticulitis Brother     Social History   Tobacco Use   Smoking status: Every Day    Current packs/day: 1.50    Average packs/day: 1.5 packs/day for 46.0 years (69.0 ttl pk-yrs)    Types: Cigarettes   Smokeless tobacco: Never  Substance Use Topics   Alcohol use: No    Alcohol/week: 0.0 standard drinks of alcohol     Current Outpatient Medications:    apixaban (ELIQUIS) 5 MG TABS tablet, Take 1 tablet by mouth twice daily, Disp: 60 tablet, Rfl: 5   blood glucose meter kit and supplies KIT, Dispense based on patient and insurance preference. Use up to tid E11.65, LON:99, Disp: 1 each, Rfl: 0   Budeson-Glycopyrrol-Formoterol (BREZTRI AEROSPHERE) 160-9-4.8 MCG/ACT AERO, Inhale 2 puffs into the lungs 2 (two) times daily. Patient receives via [employer] Patient Assistance., Disp: 32.1 g, Rfl: 3   dapagliflozin propanediol (FARXIGA) 10 MG TABS tablet, Take 1 tablet (10 mg total) by mouth daily., Disp: 90 tablet, Rfl: 3   digoxin (LANOXIN) 0.25 MG tablet, Take 0.5 tablets (0.125 mg total) by mouth daily., Disp: 15 tablet, Rfl: 5   gabapentin (NEURONTIN) 300 MG capsule, Take 300-600 mg by mouth See admin instructions. Take 1 capsule (300 mg) by mouth in the morning, take 1 capsule (300 mg) by mouth at noon, and 2 capsules (600 mg) by mouth in the evening, Disp: , Rfl:    Lancet Devices MISC, 1 each by Does not apply route 3 (three) times daily as needed. What ins will cover dx:E11.65, LON:99 tid, Disp: 100 each, Rfl: 12   LEVEMIR 100 UNIT/ML injection, INJECT 8 UNITS SUBCUTANEOUSLY AT BEDTIME, Disp: 10 mL, Rfl: 0   losartan (COZAAR) 25 MG tablet, Take 0.5 tablets (12.5 mg total) by mouth daily. Skip the dose if systolic BP less than 110 mmHg, Disp: 30 tablet, Rfl: 5   metoprolol succinate (TOPROL-XL) 200 MG 24 hr tablet, Take 1 tablet (200 mg total) by mouth daily. Take with or immediately following a meal., Disp: 30 tablet, Rfl: 11   potassium chloride SA (KLOR-CON M)  20 MEQ tablet, Take 1 tablet (20 mEq total) by mouth daily., Disp: 30 tablet, Rfl: 0   pravastatin (PRAVACHOL) 20 MG tablet, Take 1 tablet (20 mg total) by mouth daily with supper., Disp: 90 tablet, Rfl: 3   tiZANidine (ZANAFLEX) 4 MG tablet, Take 4  mg by mouth every 12 (twelve) hours., Disp: , Rfl:    torsemide (DEMADEX) 20 MG tablet, Take 1 tablet (20 mg total) by mouth daily before breakfast. With potassium pill and hold losartan when you take this medication, Disp: 30 tablet, Rfl: 0   traMADol (ULTRAM) 50 MG tablet, Take 50 mg by mouth every 12 (twelve) hours., Disp: , Rfl:    Vitamin D, Ergocalciferol, (DRISDOL) 1.25 MG (50000 UNIT) CAPS capsule, TAKE 1 CAPSULE BY MOUTH EVERY TWO WEEKS, Disp: 6 capsule, Rfl: 0  Allergies  Allergen Reactions   Penicillins Hives and Swelling    As a child. Has patient had a PCN reaction causing immediate rash, facial/tongue/throat swelling, SOB or lightheadedness with hypotension: YES  Has patient had a PCN reaction causing severe rash involving mucus membranes or skin necrosis: UNKNOWN Has patient had a PCN reaction that required hospitalization: UNKNOWN Has patient had a PCN reaction occurring within the last 10 years: NO   Aspirin Nausea Only   Ibuprofen Nausea Only   Lyrica [Pregabalin] Nausea Only   Acetaminophen Nausea Only    I personally reviewed active problem list, medication list, allergies, family history, social history, health maintenance with the patient/caregiver today.   ROS  Ten systems reviewed and is negative except as mentioned in HPI    Objective  Vitals:   03/21/23 1331  BP: 108/66  Pulse: 64  Resp: 12  Temp: 97.8 F (36.6 C)  TempSrc: Oral  SpO2: 97%  Weight: 149 lb 9.6 oz (67.9 kg)  Height: 5\' 11"  (1.803 m)    Body mass index is 20.86 kg/m.  Physical Exam  Constitutional: Patient appears well-developed and thin. Malnourished  No distress.  HEENT: head atraumatic, normocephalic, pupils equal and reactive to  light, neck supple, throat within normal limits Cardiovascular: Normal rate, regular rhythm and normal heart sounds.  No murmur heard. No BLE edema. Pulmonary/Chest: Effort normal and breath sounds normal. No respiratory distress. Abdominal: Soft.  There is no tenderness. Psychiatric: Patient has a normal mood and affect. behavior is normal. Judgment and thought content normal.    PHQ2/9:    03/21/2023    1:33 PM 02/21/2023    3:31 PM 02/07/2023    2:38 PM 12/27/2022    3:18 PM 12/18/2022    2:07 PM  Depression screen PHQ 2/9  Decreased Interest 0 0 0 0 0  Down, Depressed, Hopeless 0 0 0 0 0  PHQ - 2 Score 0 0 0 0 0  Altered sleeping 0    0  Tired, decreased energy 0    1  Change in appetite 0    0  Feeling bad or failure about yourself  0    0  Trouble concentrating 0    0  Moving slowly or fidgety/restless 0    0  Suicidal thoughts 0    0  PHQ-9 Score 0    1  Difficult doing work/chores     Not difficult at all    phq 9 is negative   Fall Risk:    03/21/2023    1:33 PM 02/21/2023    3:31 PM 02/07/2023    2:38 PM 12/27/2022    3:18 PM 12/18/2022    2:06 PM  Fall Risk   Falls in the past year? 0 0 0 0 0  Number falls in past yr:  0 0 0   Injury with Fall?  0 0 0   Risk for fall due to : No Fall Risks  Impaired balance/gait No Fall Risks No Fall Risks  Follow up Falls prevention discussed Falls prevention discussed Falls prevention discussed Falls prevention discussed Falls prevention discussed;Education provided;Falls evaluation completed     Assessment & Plan  1. Type 2 diabetes mellitus with diabetic polyneuropathy, with long-term current use of insulin (HCC)  - POCT HgB A1C  2. Chronic combined systolic and diastolic congestive heart failure (HCC)  Doing well now   3. Senile purpura (HCC)  Both arms , stable   4. Claudication [facility])  Needs to follow up with vascular surgeon  5. Chronic a-fib (HCC)  Rate controlled now but bp is towards low end of normal    6. Paranoid schizophrenia (HCC)  stable  7. Chronic hepatitis C without hepatic coma (HCC)

## 2023-03-21 ENCOUNTER — Ambulatory Visit (INDEPENDENT_AMBULATORY_CARE_PROVIDER_SITE_OTHER): Payer: PPO | Admitting: Family Medicine

## 2023-03-21 ENCOUNTER — Encounter: Payer: Self-pay | Admitting: Family Medicine

## 2023-03-21 VITALS — BP 108/66 | HR 64 | Temp 97.8°F | Resp 12 | Ht 71.0 in | Wt 149.6 lb

## 2023-03-21 DIAGNOSIS — B182 Chronic viral hepatitis C: Secondary | ICD-10-CM | POA: Diagnosis not present

## 2023-03-21 DIAGNOSIS — E1142 Type 2 diabetes mellitus with diabetic polyneuropathy: Secondary | ICD-10-CM | POA: Diagnosis not present

## 2023-03-21 DIAGNOSIS — I5042 Chronic combined systolic (congestive) and diastolic (congestive) heart failure: Secondary | ICD-10-CM

## 2023-03-21 DIAGNOSIS — I482 Chronic atrial fibrillation, unspecified: Secondary | ICD-10-CM

## 2023-03-21 DIAGNOSIS — I739 Peripheral vascular disease, unspecified: Secondary | ICD-10-CM | POA: Diagnosis not present

## 2023-03-21 DIAGNOSIS — Z794 Long term (current) use of insulin: Secondary | ICD-10-CM

## 2023-03-21 DIAGNOSIS — F200 Paranoid schizophrenia: Secondary | ICD-10-CM | POA: Diagnosis not present

## 2023-03-21 DIAGNOSIS — D692 Other nonthrombocytopenic purpura: Secondary | ICD-10-CM

## 2023-03-21 LAB — POCT GLYCOSYLATED HEMOGLOBIN (HGB A1C): Hemoglobin A1C: 7.5 % — AB (ref 4.0–5.6)

## 2023-03-22 ENCOUNTER — Ambulatory Visit: Payer: Self-pay | Admitting: *Deleted

## 2023-03-22 NOTE — Patient Outreach (Signed)
  Care Coordination   Follow Up Visit Note   03/22/2023 Name: Mark Nash MRN: [id number] DOB: August 22, 1956  KATHLEEN LIKINS is a 67 y.o. year old male who sees Alba Cory, MD for primary care. I spoke with  Jeri Cos by phone today.  What matters to the patients health and wellness today?  Remain out of hospital    Goals Addressed             This Visit's Progress    Effective management of COPD   On track    Care Coordination Interventions: Provided patient with basic written and verbal COPD education on self care/management/and exacerbation prevention Advised patient to track and manage COPD triggers Provided instruction about proper use of medications used for management of COPD including inhalers Advised patient to self assesses COPD action plan zone and make appointment with provider if in the yellow zone for 48 hours without improvement      Effective management of DM   On track    Care Coordination Interventions: Provided education to patient about basic DM disease process Reviewed medications with patient and discussed importance of medication adherence Discussed plans with patient for ongoing care management follow up and provided patient with direct contact information for care management team          SDOH assessments and interventions completed:  No     Care Coordination Interventions:  Yes, provided   Interventions Today    Flowsheet Row Most Recent Value  Chronic Disease   Chronic disease during today's visit Diabetes, Chronic Obstructive Pulmonary Disease (COPD), Congestive Heart Failure (CHF)  General Interventions   General Interventions Discussed/Reviewed General Interventions Reviewed, Labs, Doctor Visits, Durable Medical Equipment (DME)  Labs Hgb A1c every 3 months  [7.5]  Doctor Visits Discussed/Reviewed Doctor Visits Reviewed, PCP, Specialist  [Cardiology 8/20, will call vascular to schedule appointment for claudication]  Durable  Medical Equipment (DME) Glucomoter  Janene Madeira he had call that his meter may be defective, will take to be assessed and get new one if it is defective]  PCP/Specialist Visits Compliance with follow-up visit  [completed PCP appointment yesterday]  Exercise Interventions   Exercise Discussed/Reviewed Weight Managment  Weight Management Weight maintenance  [lost weight due to malnutrition, now increasing, currently 147 pounds.  goal to stay 150-160]  Education Interventions   Education Provided Provided Education  Provided Verbal Education On Medication, Blood Sugar Monitoring, When to see the doctor, Other  [monitoring blood pressure daily, today 138/77]       Follow up plan: Follow up call scheduled for 8/23    Encounter Outcome:  Pt. Visit Completed   Kemper Durie, RN, MSN, [facility] Care Management Care Management Coordinator [phone]

## 2023-03-30 DIAGNOSIS — I872 Venous insufficiency (chronic) (peripheral): Secondary | ICD-10-CM | POA: Diagnosis not present

## 2023-03-30 DIAGNOSIS — L0390 Cellulitis, unspecified: Secondary | ICD-10-CM | POA: Diagnosis not present

## 2023-03-30 DIAGNOSIS — L300 Nummular dermatitis: Secondary | ICD-10-CM | POA: Diagnosis not present

## 2023-04-09 DIAGNOSIS — L0390 Cellulitis, unspecified: Secondary | ICD-10-CM | POA: Diagnosis not present

## 2023-04-16 DIAGNOSIS — I872 Venous insufficiency (chronic) (peripheral): Secondary | ICD-10-CM | POA: Diagnosis not present

## 2023-04-16 NOTE — Progress Notes (Unsigned)
Cardiology Office Note    Date:  04/17/2023   ID:  Va, Chukwu 12/26/1955, MRN [id number]  PCP:  Alba Cory, MD  Cardiologist:  Julien Nordmann, MD  Electrophysiologist:  Lanier Prude, MD   Chief Complaint: Follow-up  History of Present Illness:   Mark Nash is a 67 y.o. male with history of nonobstructive CAD by LHC in 01/2023, HFrEF secondary to NICM possibly tachycardia induced, persistent A-fib/flutter diagnosed in 2022, schizoaffective/schizophrenia disorder, PAD, HTN, HLD, DM2, hepatitis C, COPD, and tobacco use who presents for follow-up of his CAD, cardiomyopathy, and A-fib/flutter.  He was diagnosed with A-fib/flutter in 02/2021.  Echo in 03/2021 showed an EF of 55 to 60%, no regional wall motion abnormalities, normal RV systolic function and ventricular cavity size, and trivial mitral regurgitation.  Lexiscan MPI in 04/2021 showed no significant ischemia with an EF of 59% and was overall low risk.  CT attenuation corrected images showed three-vessel coronary artery calcification.  He was evaluated by EP and initially scheduled for A-fib ablation, though this was canceled due to lower extremity wounds and swelling, and has not been revisited.  Previously failed DCCV.  Echo in 06/2021 showed an EF of 55 to 60%, no regional wall motion abnormalities, normal RV systolic function and ventricular cavity size, mildly dilated left atrium, aortic valve sclerosis without evidence of stenosis, dilated aortic root measuring 36 mm, dilated ascending aorta measuring 37 mm, and an estimated right atrial pressure of 3 mmHg.  Echo in 08/2022 showed an EF of 45 to 50%, global hypokinesis, mildly dilated LV internal cavity size, normal RV systolic function, mild biatrial enlargement, and an estimated right atrial pressure of 15 mmHg.  Rhythm during study was atrial flutter.  Lexiscan MPI in 12/2022 showed no evidence of significant ischemia and was felt to overall be a low risk MPI.    He  was admitted to the hospital in 5/20 for COPD exacerbation and diagnosed with rhinovirus and possibly pneumonia.  A-fib/flutter with RVR was difficult to control.  Limited echo from 12/2022 showed an EF of 30 to 35% normal RV systolic function and ventricular cavity size, moderately dilated left atrium, and mildly dilated aortic root measuring 41 mm.  R/LHC in 01/2023 showed mildly calcified coronary artery with mild nonobstructive CAD.  RHC showed normal filling pressures, mild pulmonary hypertension, and normal cardiac output.  Medical therapy was recommended for nonischemic cardiomyopathy felt to be tachycardia induced.  He was started on torsemide in 01/2023, by PCP, due to lower extremity swelling.  He was last seen in the office in 02/2023 and was without symptoms of angina or cardiac decompensation.  Lower extremity swelling resolved with torsemide.  He remained in atrial flutter with controlled ventricular response.  He comes in doing well from a cardiac perspective and is without symptoms of angina or cardiac decompensation.  No dyspnea, palpitations, dizziness, presyncope, or syncope.  His weight is up 7 pounds today when compared to last clinic visit, he attributes this to increased appetite and snacking.  He does not feel like he is volume overloaded.  Lower extremity swelling remains resolved.  No progressive orthopnea.  He has been snacking on potato chips recently.  Drinking less than 2 L of liquid daily.  Continues to smoke 1 pack to 1.5 packs of cigarettes daily.  No falls, hematochezia, or melena.  Adherent to cardiac medications.  Not checking blood pressure at home.  Overall feels well and does not have any acute cardiac concerns  at this time.   Labs independently reviewed: 02/2023 - A1c 7.5, potassium 4.3, BUN 13, serum creatinine 0.78, digoxin 0.8, Hgb 13.1, PLT 262, albumin 4.0 01/2023 - magnesium 2.2, AST/ALT normal, LP(a) 31.9, TSH normal 10/2022 - TC 23, TG 46, HDL 61, LDL 29  Past  Medical History:  Diagnosis Date   Arthritis    Asthma    Benign neoplasm of ascending colon    polyps   Constipation    COPD (chronic obstructive pulmonary disease) (HCC)    NO INHALER   Diabetes mellitus without complication (HCC)    Type 2   Diverticulosis    Dyspnea    RARE-WITH EXERTION DUE TO COPD   GERD (gastroesophageal reflux disease)    Hepatitis C    History of kidney stones    H/O   Hyperlipidemia    Hypertension 06/10/2018   currently not under control   Left inguinal hernia    Peripheral vascular disease (HCC)    Restless legs    Schizoaffective disorder (HCC)    Pt denies   Thrombocytopenia (HCC)    Vitamin D deficiency    Wears dentures    full upper and lower    Past Surgical History:  Procedure Laterality Date   BACK SURGERY     CARDIOVERSION N/A 05/20/2021   Procedure: CARDIOVERSION;  Surgeon: Iran Ouch, MD;  Location: [facility] ORS;  Service: Cardiovascular;  Laterality: N/A;   CATARACT EXTRACTION W/PHACO Right 10/31/2021   Procedure: CATARACT EXTRACTION PHACO AND INTRAOCULAR LENS PLACEMENT (IOC) RIGHT DIABETIC;  Surgeon: Nevada Crane, MD;  Location: [facility];  Service: Ophthalmology;  Laterality: Right;  Diabetic 4.34 00:42.8   COLONOSCOPY WITH PROPOFOL N/A 09/07/2017   Procedure: COLONOSCOPY WITH PROPOFOL;  Surgeon: Pasty Spillers, MD;  Location: [facility];  Service: Endoscopy;  Laterality: N/A;   ESOPHAGOGASTRODUODENOSCOPY (EGD) WITH PROPOFOL N/A 12/07/2017   Procedure: ESOPHAGOGASTRODUODENOSCOPY (EGD) WITH PROPOFOL;  Surgeon: Pasty Spillers, MD;  Location: [facility] END[facility];  Service: Endoscopy;  Laterality: N/A;   EYE SURGERY     FLEXIBLE SIGMOIDOSCOPY N/A 11/09/2017   Procedure: FLEXIBLE SIGMOIDOSCOPY;  Surgeon: Pasty Spillers, MD;  Location: [facility] END[facility];  Service: Endoscopy;  Laterality: N/A;   HERNIA REPAIR     inguinal   INGUINAL HERNIA REPAIR Bilateral 06/21/2018   Procedure: LAPAROSCOPIC  BILATERAL INGUINAL HERNIA REPAIR;  Surgeon: Ancil Linsey, MD;  Location: [facility];  Service: General;  Laterality: Bilateral;   L3 TO L5 LAMINECTOMY FOR DECOMPRESSION  07/17/2016   [facility]   REPAIR OF CEREBROSPINAL FLUID LEAK N/A 09/08/2016   Procedure: Lumbar wound exploration, repair of pseudomenigocele;  Surgeon: Loura Halt Ditty, MD;  Location: [facility] OR;  Service: Neurosurgery;  Laterality: N/A;  Lumbar wound exploration, repair of pseudomenigocele   RIGHT/LEFT HEART CATH AND CORONARY ANGIOGRAPHY N/A 01/29/2023   Procedure: RIGHT/LEFT HEART CATH AND CORONARY ANGIOGRAPHY;  Surgeon: Iran Ouch, MD;  Location: [facility];  Service: Cardiovascular;  Laterality: N/A;   SINUSOTOMY      Current Medications: Current Meds  Medication Sig   apixaban (ELIQUIS) 5 MG TABS tablet Take 1 tablet by mouth twice daily   blood glucose meter kit and supplies KIT Dispense based on patient and insurance preference. Use up to tid E11.65, LON:99   Budeson-Glycopyrrol-Formoterol (BREZTRI AEROSPHERE) 160-9-4.8 MCG/ACT AERO Inhale 2 puffs into the lungs 2 (two) times daily. Patient receives via [place] Patient Assistance.   dapagliflozin propanediol (FARXIGA) 10 MG TABS tablet Take  1 tablet (10 mg total) by mouth daily.   digoxin (LANOXIN) 0.25 MG tablet Take 0.5 tablets (0.125 mg total) by mouth daily.   gabapentin (NEURONTIN) 300 MG capsule Take 300-600 mg by mouth See admin instructions. Take 1 capsule (300 mg) by mouth in the morning, take 1 capsule (300 mg) by mouth at noon, and 2 capsules (600 mg) by mouth in the evening   Lancet Devices MISC 1 each by Does not apply route 3 (three) times daily as needed. What ins will cover dx:E11.65, LON:99 tid   LEVEMIR 100 UNIT/ML injection INJECT 8 UNITS SUBCUTANEOUSLY AT BEDTIME   losartan (COZAAR) 25 MG tablet Take 0.5 tablets (12.5 mg total) by mouth daily. Skip the dose if systolic BP less than 110 mmHg   metoprolol succinate  (TOPROL-XL) 200 MG 24 hr tablet Take 1 tablet (200 mg total) by mouth daily. Take with or immediately following a meal.   pravastatin (PRAVACHOL) 20 MG tablet Take 1 tablet (20 mg total) by mouth daily with supper.   tiZANidine (ZANAFLEX) 4 MG tablet Take 4 mg by mouth every 12 (twelve) hours.   traMADol (ULTRAM) 50 MG tablet Take 50 mg by mouth every 12 (twelve) hours.   triamcinolone cream (KENALOG) 0.1 % Apply topically 2 (two) times daily.   Vitamin D, Ergocalciferol, (DRISDOL) 1.25 MG (50000 UNIT) CAPS capsule TAKE 1 CAPSULE BY MOUTH EVERY TWO WEEKS    Allergies:   Penicillins, Aspirin, Ibuprofen, Lyrica [pregabalin], and Acetaminophen   Social History   Socioeconomic History   Marital status: Divorced    Spouse name: Not on file   Number of children: 2   Years of education: Not on file   Highest education level: 12th grade  Occupational History   Occupation: Disabled  Tobacco Use   Smoking status: Every Day    Current packs/day: 1.50    Average packs/day: 1.5 packs/day for 46.0 years (69.0 ttl pk-yrs)    Types: Cigarettes   Smokeless tobacco: Never  Vaping Use   Vaping status: Never Used  Substance and Sexual Activity   Alcohol use: No    Alcohol/week: 0.0 standard drinks of alcohol   Drug use: No   Sexual activity: Not Currently  Other Topics Concern   Not on file  Social History Narrative   Lives alone   Social Determinants of Health   Financial Resource Strain: Low Risk  (09/29/2022)   Overall Financial Resource Strain (CARDIA)    Difficulty of Paying Living Expenses: Not hard at all  Food Insecurity: No Food Insecurity (01/21/2023)   Hunger Vital Sign    Worried About Running Out of Food in the Last Year: Never true    Ran Out of Food in the Last Year: Never true  Transportation Needs: No Transportation Needs (01/21/2023)   PRAPARE - Administrator, Civil Service (Medical): No    Lack of Transportation (Non-Medical): No  Physical Activity:  Insufficiently Active (09/29/2022)   Exercise Vital Sign    Days of Exercise per Week: 1 day    Minutes of Exercise per Session: 20 min  Stress: No Stress Concern Present (09/29/2022)   [employer] of Occupational Health - Occupational Stress Questionnaire    Feeling of Stress : Not at all  Social Connections: Unknown (09/29/2022)   Social Connection and Isolation Panel [NHANES]    Frequency of Communication with Friends and Family: Once a week    Frequency of Social Gatherings with Friends and Family: Not on file  Attends Religious Services: Never    Active Member of Clubs or Organizations: No    Attends Banker Meetings: Never    Marital Status: Divorced     Family History:  The patient's family history includes Colitis in his mother; Diabetes in his father and sister; Diverticulitis in his brother and mother; Heart disease in his father; Heart failure in his brother and mother; Hypertension in his father and mother.  ROS:   12-point review of systems is negative unless otherwise noted in the HPI.   EKGs/Labs/Other Studies Reviewed:    Studies reviewed were summarized above. The additional studies were reviewed today:  R/LHC 01/29/2023: 1.  Mildly calcified coronary arteries with mild nonobstructive coronary artery disease. 2.  Left ventricular angiography was not performed.  EF was moderately reduced by echo. 3.  Right heart catheterization showed normal filling pressures, mild pulmonary hypertension and normal cardiac output.   Recommendations: The patient has nonischemic cardiomyopathy likely tachycardia induced.  Recommend medical therapy. Continue Toprol for anticoagulation. Resume heparin 2 hours after sheath pull and likely can transition to Eliquis later this evening. __________  Limited echo 01/26/2023: 1. Left ventricular ejection fraction, by estimation, is 30 to 35%. The  left ventricle has moderately decreased function. Left ventricular   endocardial border not optimally defined to evaluate regional wall motion.   2. Right ventricular systolic function is normal. The right ventricular  size is normal.   3. Left atrial size was moderately dilated.   4. The mitral valve is normal in structure. No evidence of mitral valve  regurgitation. No evidence of mitral stenosis.   5. The aortic valve is calcified. Aortic valve regurgitation is not  visualized. No aortic stenosis is present.   6. Aortic dilatation noted. There is mild dilatation of the aortic root,  measuring 41 mm.   7. Challenging image quality. Most valves not well seen.  __________  Eugenie Birks MPI 01/01/2023:   Sensitivity and specificity of the study are significant reduced due to artifact.  The resting images are uninterpretable due to severe extracardiac activity.   Abnormal, probably low risk pharmacologic myocardial.   There is a small in size, mild in severity, fixed apical anterior and apical defect most consistent with artifact but cannot rule out an element of scar.   There is no evidence of significant ischemia.   Left ventricular systolic function is moderately reduced (LVEF 35-45%).   Coronary artery calcification and aortic atherosclerosis are present.   No significant change from prior study on 05/12/2021 allowing for differences in artifact. __________  2D echo 09/11/2022: 1. Left ventricular ejection fraction, by estimation, is 45 to 50%. The  left ventricle has mildly decreased function. The left ventricle  demonstrates global hypokinesis. The left ventricular internal cavity size  was mildly dilated. Left ventricular  diastolic parameters are indeterminate.   2. Right ventricular systolic function is normal. The right ventricular  size is not well visualized.   3. Left atrial size was mildly dilated.   4. Right atrial size was mildly dilated.   5. The mitral valve is grossly normal. No evidence of mitral valve  regurgitation.   6. The aortic  valve is calcified. Aortic valve regurgitation is not  visualized.   7. The inferior vena cava is dilated in size with <50% respiratory  variability, suggesting right atrial pressure of 15 mmHg.   8. Rhythm strip during this exam demonstrates atrial flutter.   Comparison(s): 06/28/21 EF 55-60%.  __________  2D echo 06/28/2021:  1. Left ventricular ejection fraction, by estimation, is 55 to 60%. The  left ventricle has normal function. The left ventricle has no regional  wall motion abnormalities. Left ventricular diastolic parameters are  indeterminate.   2. Right ventricular systolic function is normal. The right ventricular  size is normal.   3. Left atrial size was mildly dilated.   4. The mitral valve was not well visualized. No evidence of mitral valve  regurgitation. No evidence of mitral stenosis.   5. The aortic valve was not well visualized. Aortic valve regurgitation  is not visualized. Mild to moderate aortic valve sclerosis/calcification  is present, without any evidence of aortic stenosis.   6. There is dilatation of the ascending aorta, measuring 37 mm. There is  dilatation of the aortic root, measuring 36 mm.   7. The inferior vena cava is normal in size with greater than 50%  respiratory variability, suggesting right atrial pressure of 3 mmHg.   Comparison(s): EF 55%, technically challenging due to limited acoustic  windows.  __________  Eugenie Birks MPI 05/12/2021: Pharmacological myocardial perfusion imaging study with no significant  ischemia Attenuation corrected images with small fixed defect in the apical region, possible artifact though unable to exclude small scar This small fixed defect not noted on nonattenuation corrected images.  Normal wall motion, EF estimated at 59% Resting EKG with atrial fibrillation No EKG changes concerning for ischemia at peak stress or in recovery. CT attenuation correction images with moderate three-vessel coronary  calcification Low risk scan __________  2D echo 04/20/2021 Mark Nash: 1. Normal LVF.   2. Left ventricular ejection fraction, by estimation, is 55 to 60%. Left  ventricular ejection fraction by PLAX is 55 %. The left ventricle has  normal function. The left ventricle has no regional wall motion  abnormalities. Left ventricular diastolic  function could not be evaluated.   3. Right ventricular systolic function is normal. The right ventricular  size is normal.   4. The mitral valve is grossly normal. Trivial mitral valve  regurgitation.   5. The aortic valve is normal in structure. Aortic valve regurgitation is  not visualized.     EKG:  EKG is ordered today.  The EKG ordered today demonstrates atrial flutter with variable AV block, 70 bpm, nonspecific ST-T changes  Recent Labs: 01/21/2023: B Natriuretic Peptide 19.7 01/27/2023: TSH 1.455 02/07/2023: ALT 34; Magnesium 2.2 02/27/2023: Hemoglobin 13.1; Platelets 262 03/12/2023: BUN 13; Creatinine, Ser 0.78; Potassium 4.3; Sodium 138  Recent Lipid Panel    Component Value Date/Time   CHOL 103 11/15/2022 1448   CHOL 112 07/13/2015 1018   TRIG 46 11/15/2022 1448   HDL 61 11/15/2022 1448   HDL 62 07/13/2015 1018   CHOLHDL 1.7 11/15/2022 1448   VLDL 12 12/15/2016 1405   LDLCALC 29 11/15/2022 1448    PHYSICAL EXAM:    VS:  BP 110/70   Pulse 70   Ht 5\' 11"  (1.803 m)   Wt 145 lb 9.6 oz (66 kg)   SpO2 95%   BMI 20.31 kg/m   BMI: Body mass index is 20.31 kg/m.  Physical Exam Constitutional:      Appearance: He is well-developed.  HENT:     Head: Normocephalic and atraumatic.  Eyes:     General:        Right eye: No discharge.        Left eye: No discharge.  Neck:     Vascular: No JVD.  Cardiovascular:     Rate and Rhythm:  Normal rate. Rhythm irregularly irregular.     Heart sounds: Normal heart sounds, S1 normal and S2 normal. Heart sounds not distant. No midsystolic click and no opening snap. No murmur heard.    No  friction rub.  Pulmonary:     Effort: Pulmonary effort is normal. No respiratory distress.     Breath sounds: Normal breath sounds. No decreased breath sounds, wheezing or rales.  Chest:     Chest wall: No tenderness.  Abdominal:     General: There is no distension.     Palpations: Abdomen is soft.     Tenderness: There is no abdominal tenderness.  Musculoskeletal:     Cervical back: Normal range of motion.     Right lower leg: No edema.     Left lower leg: No edema.     Comments: Chronic hyperpigmentation noted along the bilateral lower extremities.  Skin:    General: Skin is warm and dry.     Nails: There is no clubbing.  Neurological:     Mental Status: He is alert and oriented to person, place, and time.  Psychiatric:        Speech: Speech normal.        Behavior: Behavior normal.        Thought Content: Thought content normal.        Judgment: Judgment normal.     Wt Readings from Last 3 Encounters:  04/17/23 145 lb 9.6 oz (66 kg)  03/21/23 149 lb 9.6 oz (67.9 kg)  02/27/23 138 lb 3.2 oz (62.7 kg)     ASSESSMENT & PLAN:   Nonobstructive CAD: No symptoms of angina.  Continue aggressive risk factor modification and primary prevention including apixaban with of aspirin given underlying persistent A-fib/flutter, losartan, metoprolol, and pravastatin.  No indication for further ischemic testing at this time.  HFrEF secondary to NICM: Euvolemic and well compensated.  His weight is up 7 pounds when compared to last clinic visit, though he attributes this to increased snacking and appetite.  Relative hypotension precludes escalation of GDMT.  He is currently on maximally tolerated GDMT including losartan 12.5 mg daily, metoprolol succinate 200 mg daily (not on carvedilol secondary to difficult to control ventricular rates with atrial arrhythmia), Farxiga 10 mg daily, and digoxin 0.125 mg daily.  Not currently requiring a standing loop diuretic.  Obtain limited echo on maximally  tolerated GDMT.  Should his cardiomyopathy persist with an EF less than 35%, would recommend referral to EP and advanced heart failure.  Check BMP and digoxin.  Persistent A-fib/flutter: Has been in Afib/flutter since 2022.  Ventricular rates well-controlled on Toprol-XL 200 daily and digoxin 0.125 mg daily.  CHA2DS2-VASc at least 5 (CHF, HTN, age x 1, DM, vascular disease).  He remains on apixaban 5 mg twice daily and does not meet reduced dosing criteria.  Check BMP and digoxin level.  Should his cardiomyopathy persist, recommend referral to EP for further evaluation/management of atrial arrhythmia and consideration of ICD if cardiomyopathy remains less than 35%.  Patient has previously failed DCCV and is unlikely to hold sinus rhythm given duration of A-fib should DCCV be pursued at this time.  HTN: Blood pressure is well-controlled in the office today and precludes escalation of GDMT.  Continue medical therapy as outlined above.  HLD: LDL 29 in 10/2022.  Remains on pravastatin, followed by PCP.  Tobacco use: Continues to smoke 1 pack to 1.5 packs daily.  Working on tapering.  Complete cessation is recommended.   Disposition:  F/u with Dr. Mariah Milling or an APP in 2 months, and EP as directed.   Medication Adjustments/Labs and Tests Ordered: Current medicines are reviewed at length with the patient today.  Concerns regarding medicines are outlined above. Medication changes, Labs and Tests ordered today are summarized above and listed in the Patient Instructions accessible in Encounters.   Signed, Eula Listen, PA-C 04/17/2023 2:14 PM     [facility] [place] [address] [place], [place] [address] [phone]

## 2023-04-17 ENCOUNTER — Ambulatory Visit: Payer: PPO | Attending: Physician Assistant | Admitting: Physician Assistant

## 2023-04-17 ENCOUNTER — Encounter: Payer: Self-pay | Admitting: Physician Assistant

## 2023-04-17 VITALS — BP 110/70 | HR 70 | Ht 71.0 in | Wt 145.6 lb

## 2023-04-17 DIAGNOSIS — I2510 Atherosclerotic heart disease of native coronary artery without angina pectoris: Secondary | ICD-10-CM | POA: Diagnosis not present

## 2023-04-17 DIAGNOSIS — E785 Hyperlipidemia, unspecified: Secondary | ICD-10-CM

## 2023-04-17 DIAGNOSIS — Z79899 Other long term (current) drug therapy: Secondary | ICD-10-CM | POA: Diagnosis not present

## 2023-04-17 DIAGNOSIS — I5020 Unspecified systolic (congestive) heart failure: Secondary | ICD-10-CM | POA: Diagnosis not present

## 2023-04-17 DIAGNOSIS — Z720 Tobacco use: Secondary | ICD-10-CM | POA: Diagnosis not present

## 2023-04-17 DIAGNOSIS — I428 Other cardiomyopathies: Secondary | ICD-10-CM | POA: Diagnosis not present

## 2023-04-17 DIAGNOSIS — I10 Essential (primary) hypertension: Secondary | ICD-10-CM

## 2023-04-17 DIAGNOSIS — I4819 Other persistent atrial fibrillation: Secondary | ICD-10-CM

## 2023-04-17 NOTE — Patient Instructions (Signed)
Medication Instructions:  Your physician recommends that you continue on your current medications as directed. Please refer to the Current Medication list given to you today.  *If you need a refill on your cardiac medications before your next appointment, please call your pharmacy*   Lab Work: Your provider would like for you to have following labs drawn today BMP, digoxin level.   If you have labs (blood work) drawn today and your tests are completely normal, you will receive your results only by: MyChart Message (if you have MyChart) OR A paper copy in the mail If you have any lab test that is abnormal or we need to change your treatment, we will call you to review the results.   Testing/Procedures: Your physician has requested that you have an echocardiogram. Echocardiography is a painless test that uses sound waves to create images of your heart. It provides your doctor with information about the size and shape of your heart and how well your heart's chambers and valves are working.   You may receive an ultrasound enhancing agent through an IV if needed to better visualize your heart during the echo. This procedure takes approximately one hour.  There are no restrictions for this procedure.  This will take place at [address] ([facility] [address], [place] [address]    Follow-Up: At [facility], you and your health needs are our priority.  As part of our continuing mission to provide you with exceptional heart care, we have created designated Provider Care Teams.  These Care Teams include your primary Cardiologist (physician) and Advanced Practice Providers (APPs -  Physician Assistants and Nurse Practitioners) who all work together to provide you with the care you need, when you need it.  We recommend signing up for the patient portal called "MyChart".  Sign up information is provided on this After Visit Summary.  MyChart is used to connect with patients for  Virtual Visits (Telemedicine).  Patients are able to view lab/test results, encounter notes, upcoming appointments, etc.  Non-urgent messages can be sent to your provider as well.   To learn more about what you can do with MyChart, go to [email or web address].    Your next appointment:   2 month(s)  Provider:   Eula Listen, PA-C

## 2023-04-18 LAB — BASIC METABOLIC PANEL
BUN/Creatinine Ratio: 15 (ref 10–24)
BUN: 13 mg/dL (ref 8–27)
CO2: 26 mmol/L (ref 20–29)
Calcium: 9.3 mg/dL (ref 8.6–10.2)
Chloride: 100 mmol/L (ref 96–106)
Creatinine, Ser: 0.87 mg/dL (ref 0.76–1.27)
Glucose: 236 mg/dL — ABNORMAL HIGH (ref 70–99)
Potassium: 4.6 mmol/L (ref 3.5–5.2)
Sodium: 137 mmol/L (ref 134–144)
eGFR: 94 mL/min/{1.73_m2} (ref >59)

## 2023-04-18 LAB — DIGOXIN LEVEL: Digoxin, Serum: 0.9 ng/mL (ref 0.5–0.9)

## 2023-04-20 ENCOUNTER — Ambulatory Visit: Payer: Self-pay | Admitting: *Deleted

## 2023-04-20 NOTE — Patient Outreach (Signed)
  Care Coordination   Follow Up Visit Note   04/21/2023 Name: Mark Nash MRN: [id number] DOB: March 26, 1956  Mark Nash is a 67 y.o. year old male who sees Alba Cory, MD for primary care. I spoke with  Jeri Cos by phone today.  What matters to the patients health and wellness today?  Maintain current state of health    Goals Addressed             This Visit's Progress    Effective management of DM   On track    Care Coordination Interventions: Provided education to patient about basic DM disease process Reviewed medications with patient and discussed importance of medication adherence Discussed plans with patient for ongoing care management follow up and provided patient with direct contact information for care management team          SDOH assessments and interventions completed:  No     Care Coordination Interventions:  Yes, provided   Interventions Today    Flowsheet Row Most Recent Value  Chronic Disease   Chronic disease during today's visit Chronic Obstructive Pulmonary Disease (COPD), Diabetes  General Interventions   General Interventions Discussed/Reviewed Labs, General Interventions Reviewed, Doctor Visits, Annual Foot Exam, Durable Medical Equipment (DME)  Labs Hgb A1c every 3 months  [A1C is 7.5]  Doctor Visits Discussed/Reviewed Doctor Visits Reviewed, PCP, Specialist  Whitman Hero 9/11, Podiatry 10/16, Cardiology 10/28, PCP 11/26]  Durable Medical Equipment (DME) Glucomoter  [Reminded to call company to follow up on recall and see if he will need new meter]  PCP/Specialist Visits Compliance with follow-up visit  Exercise Interventions   Exercise Discussed/Reviewed Weight Managment  Weight Management Weight maintenance  [report daily weights]  Education Interventions   Education Provided Provided Education  Provided Verbal Education On Blood Sugar Monitoring, Medication, When to see the doctor, Foot Care  [Reminded to monitor BP and HR  daily]       Follow up plan: Follow up call scheduled for 11/27    Encounter Outcome:  Pt. Visit Completed   Kemper Durie, RN, MSN, [facility] Care Management Care Management Coordinator [phone]

## 2023-04-25 ENCOUNTER — Other Ambulatory Visit: Payer: Self-pay | Admitting: *Deleted

## 2023-04-25 DIAGNOSIS — I482 Chronic atrial fibrillation, unspecified: Secondary | ICD-10-CM

## 2023-04-25 DIAGNOSIS — I2510 Atherosclerotic heart disease of native coronary artery without angina pectoris: Secondary | ICD-10-CM

## 2023-04-25 DIAGNOSIS — Z79899 Other long term (current) drug therapy: Secondary | ICD-10-CM

## 2023-04-25 DIAGNOSIS — I5020 Unspecified systolic (congestive) heart failure: Secondary | ICD-10-CM

## 2023-04-25 MED ORDER — DIGOXIN 125 MCG PO TABS: 0.0625 mg | ORAL_TABLET | Freq: Every day | ORAL | 3 refills | Status: DC

## 2023-05-09 ENCOUNTER — Ambulatory Visit: Payer: PPO | Attending: Physician Assistant

## 2023-05-09 DIAGNOSIS — Z79899 Other long term (current) drug therapy: Secondary | ICD-10-CM | POA: Diagnosis not present

## 2023-05-09 DIAGNOSIS — I428 Other cardiomyopathies: Secondary | ICD-10-CM | POA: Diagnosis not present

## 2023-05-09 DIAGNOSIS — I482 Chronic atrial fibrillation, unspecified: Secondary | ICD-10-CM | POA: Diagnosis not present

## 2023-05-09 DIAGNOSIS — I2510 Atherosclerotic heart disease of native coronary artery without angina pectoris: Secondary | ICD-10-CM | POA: Diagnosis not present

## 2023-05-09 DIAGNOSIS — I5020 Unspecified systolic (congestive) heart failure: Secondary | ICD-10-CM | POA: Diagnosis not present

## 2023-05-09 LAB — ECHOCARDIOGRAM LIMITED

## 2023-05-10 LAB — DIGOXIN LEVEL: Digoxin, Serum: <0.4 ng/mL — ABNORMAL LOW (ref 0.5–0.9)

## 2023-05-14 DIAGNOSIS — G894 Chronic pain syndrome: Secondary | ICD-10-CM | POA: Diagnosis not present

## 2023-05-14 DIAGNOSIS — Z720 Tobacco use: Secondary | ICD-10-CM | POA: Diagnosis not present

## 2023-05-14 DIAGNOSIS — I10 Essential (primary) hypertension: Secondary | ICD-10-CM | POA: Diagnosis not present

## 2023-05-14 DIAGNOSIS — M5416 Radiculopathy, lumbar region: Secondary | ICD-10-CM | POA: Diagnosis not present

## 2023-05-14 DIAGNOSIS — Z716 Tobacco abuse counseling: Secondary | ICD-10-CM | POA: Diagnosis not present

## 2023-05-14 DIAGNOSIS — M5430 Sciatica, unspecified side: Secondary | ICD-10-CM | POA: Diagnosis not present

## 2023-05-14 DIAGNOSIS — F209 Schizophrenia, unspecified: Secondary | ICD-10-CM | POA: Diagnosis not present

## 2023-05-14 DIAGNOSIS — E119 Type 2 diabetes mellitus without complications: Secondary | ICD-10-CM | POA: Diagnosis not present

## 2023-05-14 DIAGNOSIS — F1120 Opioid dependence, uncomplicated: Secondary | ICD-10-CM | POA: Diagnosis not present

## 2023-05-14 DIAGNOSIS — Z79891 Long term (current) use of opiate analgesic: Secondary | ICD-10-CM | POA: Diagnosis not present

## 2023-05-14 DIAGNOSIS — M961 Postlaminectomy syndrome, not elsewhere classified: Secondary | ICD-10-CM | POA: Diagnosis not present

## 2023-05-14 DIAGNOSIS — M545 Low back pain, unspecified: Secondary | ICD-10-CM | POA: Diagnosis not present

## 2023-05-17 DIAGNOSIS — I872 Venous insufficiency (chronic) (peripheral): Secondary | ICD-10-CM | POA: Diagnosis not present

## 2023-05-28 ENCOUNTER — Other Ambulatory Visit: Payer: Self-pay | Admitting: Family Medicine

## 2023-05-28 ENCOUNTER — Other Ambulatory Visit: Payer: Self-pay | Admitting: Pharmacist

## 2023-05-28 ENCOUNTER — Telehealth: Payer: Self-pay | Admitting: Family Medicine

## 2023-05-28 NOTE — Telephone Encounter (Signed)
Pt is receiving text notifications that it is time to renew his patient assistance for Budeson-Glycopyrrol-Formoterol (BREZTRI AEROSPHERE) 160-9-4.8 MCG/ACT AERO [[id number]] & dapagliflozin propanediol (FARXIGA) 10 MG TABS tablet [[id number]] . Pt reports that in the past Mark Nash has helped him with patient assistance information. Please advise CB- [phone]

## 2023-05-28 NOTE — Progress Notes (Signed)
05/28/2023  Patient ID: Mark Nash, male   DOB: 15-Sep-1955, 67 y.o.   MRN: [id number]  Receive a message from office requesting call to patient regarding medication assistance renewal.  From review of chart, note patient previously engaged with Pharmacist Angelena Sole.  Outreach to patient by telephone today. Reports he received text messages from [employer] regarding re-enrollment in patient assistance for [name] and [name] for next calendar year. However, patient states that he deleted these messages as he does not text or email.   Believes that he has sufficient supply of both Breztri and [name] to last through the end of the current calendar year. Provide patient with phone number for [employer] Patient assistance program ([phone]) in case needed for refills.  Follow Up Plan: Clinical Pharmacist will outreach to patient by telephone on 06/29/2023 at 10 am to follow up regarding medication assistance re-enrollment  Estelle Grumbles, PharmD, [facility] [phone]

## 2023-06-04 ENCOUNTER — Other Ambulatory Visit: Payer: Self-pay | Admitting: Cardiovascular Disease

## 2023-06-04 DIAGNOSIS — I482 Chronic atrial fibrillation, unspecified: Secondary | ICD-10-CM

## 2023-06-04 NOTE — Telephone Encounter (Signed)
Please review

## 2023-06-04 NOTE — Telephone Encounter (Signed)
Prescription refill request for Eliquis received. Indication: Afib  Last office visit: 04/17/23 Shea Evans)  Scr: 0.87 (04/17/23)  Age: 67 Weight: 66kg  Appropriate dose. Refill sent.

## 2023-06-13 DIAGNOSIS — M216X2 Other acquired deformities of left foot: Secondary | ICD-10-CM | POA: Diagnosis not present

## 2023-06-13 DIAGNOSIS — M79674 Pain in right toe(s): Secondary | ICD-10-CM | POA: Diagnosis not present

## 2023-06-13 DIAGNOSIS — M722 Plantar fascial fibromatosis: Secondary | ICD-10-CM | POA: Diagnosis not present

## 2023-06-13 DIAGNOSIS — L851 Acquired keratosis [keratoderma] palmaris et plantaris: Secondary | ICD-10-CM | POA: Diagnosis not present

## 2023-06-13 DIAGNOSIS — E1142 Type 2 diabetes mellitus with diabetic polyneuropathy: Secondary | ICD-10-CM | POA: Diagnosis not present

## 2023-06-13 DIAGNOSIS — I739 Peripheral vascular disease, unspecified: Secondary | ICD-10-CM | POA: Diagnosis not present

## 2023-06-13 DIAGNOSIS — L909 Atrophic disorder of skin, unspecified: Secondary | ICD-10-CM | POA: Diagnosis not present

## 2023-06-13 DIAGNOSIS — M7741 Metatarsalgia, right foot: Secondary | ICD-10-CM | POA: Diagnosis not present

## 2023-06-13 DIAGNOSIS — L84 Corns and callosities: Secondary | ICD-10-CM | POA: Diagnosis not present

## 2023-06-13 DIAGNOSIS — B351 Tinea unguium: Secondary | ICD-10-CM | POA: Diagnosis not present

## 2023-06-13 DIAGNOSIS — M216X1 Other acquired deformities of right foot: Secondary | ICD-10-CM | POA: Diagnosis not present

## 2023-06-13 DIAGNOSIS — M7742 Metatarsalgia, left foot: Secondary | ICD-10-CM | POA: Diagnosis not present

## 2023-06-13 DIAGNOSIS — M79675 Pain in left toe(s): Secondary | ICD-10-CM | POA: Diagnosis not present

## 2023-06-21 ENCOUNTER — Ambulatory Visit (INDEPENDENT_AMBULATORY_CARE_PROVIDER_SITE_OTHER): Payer: PPO | Admitting: Family Medicine

## 2023-06-21 ENCOUNTER — Encounter: Payer: Self-pay | Admitting: Family Medicine

## 2023-06-21 VITALS — BP 122/68 | HR 64 | Temp 98.8°F | Resp 18 | Ht 71.0 in | Wt 150.5 lb

## 2023-06-21 DIAGNOSIS — J329 Chronic sinusitis, unspecified: Secondary | ICD-10-CM | POA: Diagnosis not present

## 2023-06-21 DIAGNOSIS — R051 Acute cough: Secondary | ICD-10-CM | POA: Diagnosis not present

## 2023-06-21 LAB — POCT INFLUENZA A/B
Influenza A, POC: NEGATIVE
Influenza B, POC: NEGATIVE

## 2023-06-21 MED ORDER — GUAIFENESIN ER 600 MG PO TB12
600.0000 mg | ORAL_TABLET | Freq: Two times a day (BID) | ORAL | 0 refills | Status: DC | PRN
Start: 2023-06-21 — End: 2023-07-20

## 2023-06-21 MED ORDER — DOXYCYCLINE HYCLATE 100 MG PO TABS: 100.0000 mg | ORAL_TABLET | Freq: Two times a day (BID) | ORAL | 0 refills | Status: AC

## 2023-06-21 MED ORDER — CETIRIZINE HCL 10 MG PO TABS
10.0000 mg | ORAL_TABLET | Freq: Every evening | ORAL | 1 refills | Status: DC | PRN
Start: 2023-06-21 — End: 2023-11-19

## 2023-06-21 NOTE — Progress Notes (Signed)
Patient ID: Mark Nash, male    DOB: Jul 03, 1956, 67 y.o.   MRN: [id number]  PCP: Alba Cory, MD  Chief Complaint  Patient presents with   URI    Since Friday, cough, headache nasal drainage    Subjective:   Mark Nash is a 67 y.o. male, presents to clinic with CC of the following:  HPI  Pt had onset of nasal congestion, drainage and sinus pressure last Friday (6 days ago) some days he woke up with a  lot of pressure, today its not as severe but he continues to have drainage and he's worried it may get worse.  He has hx of COPD, IDDM, afib/flutter, CHF He is compliant with his maintenance inhalers, doesn't feel like he is coughing a lot, denies chest tightness, congestion, wheeze, SOB.  He does not have nebs at home or rescue inhaler   Sugars have been a little higher lately - but he doesn't know how high states he hasn't checked sugars in a while On basal insulin 8 units daily Lab Results  Component Value Date   HGBA1C 7.5 (A) 03/21/2023    He denies HA, body aches, fever, sweats, chills, fatigue He has not tried any medications over the counter.  Patient Active Problem List   Diagnosis Date Noted   HFrEF (heart failure with reduced ejection fraction) (HCC) 01/30/2023   Chronic combined systolic and diastolic congestive heart failure (HCC) 01/28/2023   Pressure injury of skin 01/28/2023   Chronic atrial flutter (HCC) 01/26/2023   Acute systolic heart failure (HCC) 01/26/2023   Acute respiratory failure with hypoxia (HCC) 01/24/2023   Rhinovirus infection 01/23/2023   COPD exacerbation (HCC) 01/21/2023   Senile purpura (HCC) 11/15/2022   Chronic a-fib (HCC) 03/15/2022   Type 2 diabetes mellitus with diabetic polyneuropathy, with long-term current use of insulin (HCC) 03/16/2021   DDD (degenerative disc disease), lumbar 03/06/2019   Primary osteoarthritis involving multiple joints 03/06/2019   Lumbar radiculopathy 11/05/2018   Lumbar post-laminectomy  syndrome 11/05/2018   Diabetic polyneuropathy associated with type 2 diabetes mellitus (HCC) 11/05/2018   CLE (columnar lined esophagus)    Gastric irritation    Duodenum ulcer    History of colonic polyps    Polyp of sigmoid colon    Diverticulosis of large intestine without diverticulitis    Benign neoplasm of ascending colon    Osteoarthritis of both hands 09/04/2017   Chronic pain of left knee 09/04/2017   Lymphedema 07/07/2016   Chronic hepatitis C without hepatic coma (HCC) 06/01/2016   Lumbar herniated disc 11/24/2015   Thrombocytopenia (HCC) 11/11/2015   Hepatitis C antibody test positive 07/13/2015   Chronic constipation 07/13/2015   Constipation 07/13/2015   Diabetes mellitus with coincident hypertension (HCC) 03/08/2015   Dyslipidemia 03/08/2015   Paranoid schizophrenia (HCC) 03/08/2015   Restless leg 03/08/2015   Callus of foot 03/08/2015   Claudication (HCC) 03/08/2015   Chronic obstructive pulmonary disease (HCC) 03/08/2015   Corn or callus 03/08/2015   Essential (primary) hypertension 03/08/2015   HLD (hyperlipidemia) 03/08/2015   Peripheral vascular disease (HCC) 03/08/2015   Polyneuropathy 03/08/2015   Current tobacco use 03/08/2015   Vitamin D deficiency 04/26/2009   Avitaminosis D 04/26/2009      Current Outpatient Medications:    apixaban (ELIQUIS) 5 MG TABS tablet, Take 1 tablet by mouth twice daily, Disp: 180 tablet, Rfl: 1   blood glucose meter kit and supplies KIT, Dispense based on patient and insurance preference. Use up  to tid E11.65, LON:99, Disp: 1 each, Rfl: 0   Budeson-Glycopyrrol-Formoterol (BREZTRI AEROSPHERE) 160-9-4.8 MCG/ACT AERO, Inhale 2 puffs into the lungs 2 (two) times daily. Patient receives via [employer] Patient Assistance., Disp: 32.1 g, Rfl: 3   dapagliflozin propanediol (FARXIGA) 10 MG TABS tablet, Take 1 tablet (10 mg total) by mouth daily., Disp: 90 tablet, Rfl: 3   digoxin (LANOXIN) 0.125 MG tablet, Take 0.5 tablets (0.0625 mg  total) by mouth daily., Disp: 90 tablet, Rfl: 3   gabapentin (NEURONTIN) 300 MG capsule, Take 300-600 mg by mouth See admin instructions. Take 1 capsule (300 mg) by mouth in the morning, take 1 capsule (300 mg) by mouth at noon, and 2 capsules (600 mg) by mouth in the evening, Disp: , Rfl:    Lancet Devices MISC, 1 each by Does not apply route 3 (three) times daily as needed. What ins will cover dx:E11.65, LON:99 tid, Disp: 100 each, Rfl: 12   LEVEMIR 100 UNIT/ML injection, INJECT 8 UNITS SUBCUTANEOUSLY AT BEDTIME, Disp: 10 mL, Rfl: 0   losartan (COZAAR) 25 MG tablet, Take 0.5 tablets (12.5 mg total) by mouth daily. Skip the dose if systolic BP less than 110 mmHg, Disp: 30 tablet, Rfl: 5   metoprolol succinate (TOPROL-XL) 200 MG 24 hr tablet, Take 1 tablet (200 mg total) by mouth daily. Take with or immediately following a meal., Disp: 30 tablet, Rfl: 11   potassium chloride SA (KLOR-CON M) 20 MEQ tablet, Take 1 tablet (20 mEq total) by mouth daily., Disp: 30 tablet, Rfl: 0   pravastatin (PRAVACHOL) 20 MG tablet, Take 1 tablet (20 mg total) by mouth daily with supper., Disp: 90 tablet, Rfl: 3   tiZANidine (ZANAFLEX) 4 MG tablet, Take 4 mg by mouth every 12 (twelve) hours., Disp: , Rfl:    torsemide (DEMADEX) 20 MG tablet, Take 1 tablet (20 mg total) by mouth daily before breakfast. With potassium pill and hold losartan when you take this medication, Disp: 30 tablet, Rfl: 0   traMADol (ULTRAM) 50 MG tablet, Take 50 mg by mouth every 12 (twelve) hours., Disp: , Rfl:    triamcinolone cream (KENALOG) 0.1 %, Apply topically 2 (two) times daily., Disp: , Rfl:    Vitamin D, Ergocalciferol, (DRISDOL) 1.25 MG (50000 UNIT) CAPS capsule, TAKE 1 CAPSULE BY MOUTH EVERY TWO WEEKS, Disp: 6 capsule, Rfl: 0   Allergies  Allergen Reactions   Penicillins Hives and Swelling    As a child. Has patient had a PCN reaction causing immediate rash, facial/tongue/throat swelling, SOB or lightheadedness with hypotension: YES   Has patient had a PCN reaction causing severe rash involving mucus membranes or skin necrosis: UNKNOWN Has patient had a PCN reaction that required hospitalization: UNKNOWN Has patient had a PCN reaction occurring within the last 10 years: NO   Aspirin Nausea Only   Ibuprofen Nausea Only   Lyrica [Pregabalin] Nausea Only   Acetaminophen Nausea Only     Social History   Tobacco Use   Smoking status: Every Day    Current packs/day: 1.50    Average packs/day: 1.5 packs/day for 46.0 years (69.0 ttl pk-yrs)    Types: Cigarettes   Smokeless tobacco: Never  Vaping Use   Vaping status: Never Used  Substance Use Topics   Alcohol use: No    Alcohol/week: 0.0 standard drinks of alcohol   Drug use: No      Chart Review Today: I personally reviewed active problem list, medication list, allergies, family history, social history, health maintenance,  notes from last encounter, lab results, imaging with the patient/caregiver today.   Review of Systems  Constitutional: Negative.   HENT: Negative.    Eyes: Negative.   Respiratory: Negative.    Cardiovascular: Negative.   Gastrointestinal: Negative.   Endocrine: Negative.   Genitourinary: Negative.   Musculoskeletal: Negative.   Skin: Negative.   Allergic/Immunologic: Negative.   Neurological: Negative.   Hematological: Negative.   Psychiatric/Behavioral: Negative.    All other systems reviewed and are negative.      Objective:   Vitals:   06/21/23 1043  BP: 122/68  Pulse: 64  Resp: 18  Temp: 98.8 F (37.1 C)  SpO2: 97%  Weight: 150 lb 8 oz (68.3 kg)  Height: 5\' 11"  (1.803 m)    Body mass index is 20.99 kg/m.  Physical Exam Vitals and nursing note reviewed.  Constitutional:      General: He is not in acute distress.    Appearance: He is well-developed. He is not toxic-appearing or diaphoretic.     Comments: Elderly, alert, NAD  HENT:     Head: Normocephalic and atraumatic.     Right Ear: External ear normal.      Left Ear: External ear normal.     Nose: Congestion and rhinorrhea present.     Right Sinus: No maxillary sinus tenderness or frontal sinus tenderness.     Left Sinus: No maxillary sinus tenderness or frontal sinus tenderness.     Comments: Mild nasal mucosa edema and erythema with clear discharge Mild OP erythema    Mouth/Throat:     Mouth: Mucous membranes are moist.     Pharynx: Oropharynx is clear. Posterior oropharyngeal erythema and postnasal drip present. No oropharyngeal exudate or uvula swelling.     Tonsils: No tonsillar exudate. 0 on the right. 0 on the left.  Eyes:     General:        Right eye: No discharge.        Left eye: No discharge.     Conjunctiva/sclera: Conjunctivae normal.  Neck:     Trachea: No tracheal deviation.  Cardiovascular:     Rate and Rhythm: Normal rate.  Pulmonary:     Effort: Pulmonary effort is normal. No tachypnea, accessory muscle usage, respiratory distress or retractions.     Breath sounds: No stridor or transmitted upper airway sounds. Rhonchi present. No decreased breath sounds, wheezing or rales.  Lymphadenopathy:     Cervical: No cervical adenopathy.  Skin:    General: Skin is warm and dry.     Findings: No rash.  Neurological:     Mental Status: He is alert.     Motor: No abnormal muscle tone.     Coordination: Coordination normal.  Psychiatric:        Mood and Affect: Mood normal.        Behavior: Behavior normal.      Results for orders placed or performed in visit on 06/21/23  POCT Influenza A/B  Result Value Ref Range   Influenza A, POC Negative Negative   Influenza B, POC Negative Negative       Assessment & Plan:    Pt is an 67 y/o male presents with nasal sx and congestion x 6 days, somet morning he's noted more severe sinus pain/pressure, better today but he continues to have nasal drainage and was concerned about possibly getting worse.  He's tried nothing otc.  No fever, sweats chills.  He has some mild throat  clearing, and he  did deny coughing but stated he was worried about developing COPD exacerbation.  His biggest concern was nasal congestion and pressure  1. Rhinosinusitis Encouraged him to try saline nasal spray and 2nd generation antihistamine and watchful waiting He had no sinus ttp, no acute worsening of sx or HA/fever - and mostly feels better today than some days over the weekend.  With some supportive and symptomatic care I hope he will continue to improve We did discuss abx - current presentation abx are not indicated for acute bacterial sinusitis - if he has acute sudden and persistent worsening he can start doxycycline and encouraged him to continue other allergy/nasal supportive/sx care (all written out for him on AVS) - Novel Coronavirus, NAA ([facility]) - cetirizine (ZYRTEC) 10 MG tablet; Take 1 tablet (10 mg total) by mouth at bedtime as needed for allergies or rhinitis.  Dispense: 30 tablet; Refill: 1  2. Acute cough Lungs w/o wheeze or rales, some scattered rhonchi Encouraged him to tx sinuses/allergies/nasal drainage, start mucinex, and use his inhalers At this time he does not have increased cough or wheeze/SOB - hope he will not have COPD exacerbation No steroids today - but close f/up if any worsening and pt was encouraged to check his blood sugars in case he needs steroids we will be able to manage increasing CBGs   - POCT Influenza A/B - Novel Coronavirus, NAA ([facility]) - guaiFENesin (MUCINEX) 600 MG 12 hr tablet; Take 1 tablet (600 mg total) by mouth 2 (two) times daily as needed for cough or to loosen phlegm.  Dispense: 30 tablet; Refill: 0   Pt agrees with plan -  AVS typed up and reviewed with him F/up precautions also reviewed and pt verbalized understanding  AVS:  Try the antihistamine meds once a day at bedtime Use mucinex and your inhalers You can also try over the counter saline nasal spray to loosen up the mucous and drainage in your nose and sinuses  If you  have sudden severe worsening of sinus/facial pain with headache and it is consistent and associated with fever, body aches or just other generally feeling ill - that is when I want you to start taking the doxycycline   If you feel the same as today and get better over the next week then I do not think you need the antibiotics but recommend you do the other pills and meds to help with your symptoms  Please come back for [place] to recheck you if you are unsure or if you have any other new or worsening symptoms   Danelle Berry, PA-C 06/21/23 10:58 AM

## 2023-06-21 NOTE — Patient Instructions (Signed)
Try the antihistamine meds once a day at bedtime Use mucinex and your inhalers You can also try over the counter saline nasal spray to loosen up the mucous and drainage in your nose and sinuses  If you have sudden severe worsening of sinus/facial pain with headache and it is consistent and associated with fever, body aches or just other generally feeling ill - that is when I want you to start taking the doxycycline   If you feel the same as today and get better over the next week then I do not think you need the antibiotics but recommend you do the other pills and meds to help with your symptoms  Please come back for [place] to recheck you if you are unsure or if you have any other new or worsening symptoms  Sinus Infection, Adult A sinus infection is soreness and swelling (inflammation) of your sinuses. Sinuses are hollow spaces in the bones around your face. They are located: Around your eyes. In the middle of your forehead. Behind your nose. In your cheekbones. Your sinuses and nasal passages are lined with a fluid called mucus. Mucus drains out of your sinuses. Swelling can trap mucus in your sinuses. This lets germs (bacteria, virus, or fungus) grow, which leads to infection. Most of the time, this condition is caused by a virus. What are the causes? Allergies. Asthma. Germs. Things that block your nose or sinuses. Growths in the nose (nasal polyps). Chemicals or irritants in the air. A fungus. This is rare. What increases the risk? Having a weak body defense system (immune system). Doing a lot of swimming or diving. Using nasal sprays too much. Smoking. What are the signs or symptoms? The main symptoms of this condition are pain and a feeling of pressure around the sinuses. Other symptoms include: Stuffy nose (congestion). This may make it hard to breathe through your nose. Runny nose (drainage). Soreness, swelling, and warmth in the sinuses. A cough that may get worse at  night. Being unable to smell and taste. Mucus that collects in the throat or the back of the nose (postnasal drip). This may cause a sore throat or bad breath. Being very tired (fatigued). A fever. How is this diagnosed? Your symptoms. Your medical history. A physical exam. Tests to find out if your condition is short-term (acute) or long-term (chronic). Your doctor may: Check your nose for growths (polyps). Check your sinuses using a tool that has a light on one end (endoscope). Check for allergies or germs. Do imaging tests, such as an MRI or CT scan. How is this treated? Treatment for this condition depends on the cause and whether it is short-term or long-term. If caused by a virus, your symptoms should go away on their own within 10 days. You may be given medicines to relieve symptoms. They include: Medicines that shrink swollen tissue in the nose. A spray that treats swelling of the nostrils. Rinses that help get rid of thick mucus in your nose (nasal saline washes). Medicines that treat allergies (antihistamines). Over-the-counter pain relievers. If caused by bacteria, your doctor may wait to see if you will get better without treatment. You may be given antibiotic medicine if you have: A very bad infection. A weak body defense system. If caused by growths in the nose, surgery may be needed. Follow these instructions at home: Medicines Take, use, or apply over-the-counter and prescription medicines only as told by your doctor. These may include nasal sprays. If you were prescribed an antibiotic  medicine, take it as told by your doctor. Do not stop taking it even if you start to feel better. Hydrate and humidify  Drink enough water to keep your pee (urine) pale yellow. Use a cool mist humidifier to keep the humidity level in your home above 50%. Breathe in steam for 10-15 minutes, 3-4 times a day, or as told by your doctor. You can do this in the bathroom while a hot shower is  running. Try not to spend time in cool or dry air. Rest Rest as much as you can. Sleep with your head raised (elevated). Make sure you get enough sleep each night. General instructions  Put a warm, moist washcloth on your face 3-4 times a day, or as often as told by your doctor. Use nasal saline washes as often as told by your doctor. Wash your hands often with soap and water. If you cannot use soap and water, use hand sanitizer. Do not smoke. Avoid being around people who are smoking (secondhand smoke). Keep all follow-up visits. Contact a doctor if: You have a fever. Your symptoms get worse. Your symptoms do not get better within 10 days. Get help right away if: You have a very bad headache. You cannot stop vomiting. You have very bad pain or swelling around your face or eyes. You have trouble seeing. You feel confused. Your neck is stiff. You have trouble breathing. These symptoms may be an emergency. Get help right away. Call 911. Do not wait to see if the symptoms will go away. Do not drive yourself to the hospital. Summary A sinus infection is swelling of your sinuses. Sinuses are hollow spaces in the bones around your face. This condition is caused by tissues in your nose that become inflamed or swollen. This traps germs. These can lead to infection. If you were prescribed an antibiotic medicine, take it as told by your doctor. Do not stop taking it even if you start to feel better. Keep all follow-up visits. This information is not intended to replace advice given to you by your health care provider. Make sure you discuss any questions you have with your health care provider. Document Revised: 07/19/2021 Document Reviewed: 07/19/2021 Elsevier Patient Education  2024 [employer].

## 2023-06-23 LAB — NOVEL CORONAVIRUS, NAA: SARS-CoV-2, NAA: NOT DETECTED

## 2023-06-24 NOTE — Progress Notes (Unsigned)
Cardiology Office Note:    Date:  06/25/2023  ID:  AMRO DISCALA, DOB May 03, 1956, MRN [id number] PCP: Alba Cory, MD  [facility] Providers Cardiologist:  Julien Nordmann, MD Electrophysiologist:  Lanier Prude, MD       Patient Profile:      Heart failure with reduced ejection fraction Persistent A-fib/flutter Coronary artery disease Hypertension Hyperlipidemia Pulmonary hypertension PAD Type 2 diabetes COPD Schizoaffective/schizophrenia disorder Tobacco abuse      History of Present Illness:  Discussed the use of AI scribe software for clinical note transcription with the patient, who gave verbal consent to proceed.  Mark Nash is a 67 y.o. male who returns for follow-up CAD, heart failure/cardiomyopathy, A-fib/flutter.  He was originally diagnosed with A-fib/flutter in 03/16/2021, he underwent successful cardioversion in 9/22. Echo 04/16/2021 showed EF 55 to 60%, no RWMA, RV systolic function and ventricular size normal, trivial mitral regurgitation.  Lexiscan 05/06/2021 with no ischemia with EF of 59%, showing overall low risk.  He was evaluated by EP and scheduled for ablation though this was canceled due to lower extremity swelling and wounds, but later follow-up he was felt not to be a good candidate for ablation.  He did have a previously failed DCCV.  Echo 06/2021 with an EF 55 to 60%, no RWMA, normal RV systolic function, mildly dilated left atrium, dilated aortic root measuring 36 mm, dilated ascending aorta measuring 37 mm.  Echo 09/16/2022 EF 45 to 50%, global hypokinesis, mildly dilated LV internal cavity, RV systolic function normal, mild biatrial enlargement.  Lexiscan 12/2022 with no evidence of significant ischemia.    He was admitted into the hospital on 01/21/23 for COPD exacerbation.  Heart rate was difficult to control with rates in the 140s.  A-fib/flutter with RVR was baseline rhythm, limited echo at the time showed EF 30 to 35%.  Cardizem was  stopped in setting of decreased EF.  He had a R/LHC in 02/15/2023 showing mildly calcified coronary artery with mild nonobstructive CAD.  RHC showed normal filling pressures, mild pulmonary hypertension.  At this time medical therapy was recommended for nonischemic cardiomyopathy felt to be tachycardia induced.  While inpatient his ventricular rates remain suboptimally controlled and he was started on digoxin. His PCP on 02/21/2023 started him on torsemide 20 mg due to lower extremity swelling.Of note he is currently max tolerated GDMT.  Relative hypotension precludes escalation of GDMT from ARB to ARNI.  On 7-24 his losartan was decreased to 12.5 mg daily due to hypotension.  Last seen in clinic on 04/17/2023 his weight was up 7 pounds from his last clinic visit which was attributed to increased appetite and snacking.  He was not fluid overloaded at the time and continues to smoke 1 pack to 1.5 packs of cigarettes daily.  Echocardiogram was ordered at this time due to maximally tolerated GDMT.  Echo 05/09/2023 with LVEF 50 to 55%, no RWMA, RV systolic function normal, normal pulmonary artery systolic pressure at 21.6 mmHg, mild to moderate tricuspid valve regurgitation.    Today:  He notes that he is doing well overall.  He notes his only complaint is slight weakness and fatigue has been ongoing for many years.  He notes that his dyspnea has completely resolved since his admission earlier this year.  He denies chest pain and denies lower extremity edema.  He tells me that he continues to smoke 1 pack of cigarettes daily.  He notes he has not experienced any fast heart rates at home.  Review of Systems  Constitutional: Negative for weight gain and weight loss.  Cardiovascular:  Negative for chest pain, claudication, cyanosis, dyspnea on exertion, irregular heartbeat, leg swelling, near-syncope, orthopnea, palpitations, paroxysmal nocturnal dyspnea and syncope.  Respiratory:  Negative for cough,  hemoptysis and shortness of breath.   Gastrointestinal:  Negative for abdominal pain, hematochezia and melena.  Genitourinary:  Negative for hematuria.  Neurological:  Negative for dizziness and light-headedness.     See HPI     Studies Reviewed:   EKG Interpretation Date/Time:  Monday June 25 2023 15:56:08 EDT Ventricular Rate:  70 PR Interval:    QRS Duration:  94 QT Interval:  428 QTC Calculation: 462 R Axis:   51  Text Interpretation: Atrial flutter with variable A-V block Confirmed by Rise Paganini [phone]) on 06/25/2023 4:27:01 PM    Echo Limited 05/09/2023 1. Left ventricular ejection fraction, by estimation, is 50 to 55%. The  left ventricle has low normal function. The left ventricle has no regional  wall motion abnormalities. Left ventricular diastolic parameters are  indeterminate.   2. Right ventricular systolic function is normal. The right ventricular  size is normal. There is normal pulmonary artery systolic pressure. The  estimated right ventricular systolic pressure is 21.6 mmHg.   3. The mitral valve is normal in structure. No evidence of mitral valve  regurgitation. No evidence of mitral stenosis.   4. Tricuspid valve regurgitation is mild to moderate.   5. The aortic valve is normal in structure. Aortic valve regurgitation is  not visualized. No aortic stenosis is present.   6. The inferior vena cava is normal in size with greater than 50%  respiratory variability, suggesting right atrial pressure of 3 mmHg.   R/LHC 01/29/2023 1.  Mildly calcified coronary arteries with mild nonobstructive coronary artery disease. 2.  Left ventricular angiography was not performed.  EF was moderately reduced by echo. 3.  Right heart catheterization showed normal filling pressures, mild pulmonary hypertension and normal cardiac output.   Recommendations: The patient has nonischemic cardiomyopathy likely tachycardia induced.  Recommend medical  therapy. Diagnostic Dominance: Right  Risk Assessment/Calculations:    CHA2DS2-VASc Score = 5   This indicates a 7.2% annual risk of stroke. The patient's score is based upon: CHF History: 1 HTN History: 1 Diabetes History: 1 Stroke History: 0 Vascular Disease History: 1 Age Score: 1 Gender Score: 0            Physical Exam:   VS:  BP 135/80 (BP Location: Left Arm, Patient Position: Sitting, Cuff Size: Normal)   Pulse 70   Ht 5\' 11"  (1.803 m)   Wt 148 lb 9.6 oz (67.4 kg)   SpO2 96%   BMI 20.73 kg/m    Wt Readings from Last 3 Encounters:  06/25/23 148 lb 9.6 oz (67.4 kg)  06/21/23 150 lb 8 oz (68.3 kg)  04/17/23 145 lb 9.6 oz (66 kg)    Constitutional:      Appearance: Normal and healthy appearance.  HENT:     Head: Normocephalic.  Neck:     Vascular: JVD normal.  Pulmonary:     Effort: Pulmonary effort is normal.     Breath sounds: Normal breath sounds.  Chest:     Chest wall: Not tender to palpatation.  Cardiovascular:     PMI at left midclavicular line. Normal rate. Regular rhythm. Normal S1. Normal S2.      Murmurs: There is no murmur.     No gallop.  No click.  No rub.  Pulses:    Intact distal pulses.  Edema:    Peripheral edema absent.  Musculoskeletal: Normal range of motion.     Cervical back: Normal range of motion and neck supple. Skin:    General: Skin is warm and dry.  Neurological:     General: No focal deficit present.     Mental Status: Alert and oriented to person, place and time.  Psychiatric:        Behavior: Behavior is cooperative.        Assessment and Plan:  HFimpEF -Secondary to NICM, thought to be tachycardia induced, now improved -Echo 05/09/2023 EF 50-55%, no RWMA, RV systolic function normal, pulmonary systolic pressure at 21.6 mmHg. (Vastly improved per echo 01/26/2023 where EF was 30 to 35%) -Currently heart rate controlled 70 -He denies shortness of breath, lower extremity swelling -Discussed less than 2 g sodium  restriction, fluid restriction -Relative hypotension precludes escalation of GDMT from ARB to ARNI -Continue losartan 12.5 mg, Toprol succinate 200 mg daily, Farxiga 10 mg daily, digoxin 0.25 mg daily -Plan for dig level, CBC, BMP  Coronary artery disease -R/LHC shows mildly calcified coronary arteries with mild nonobstructive coronary artery disease, right heart catheterization showed normal filling pressures, mild pulmonary hypertension -No anginal symptoms  -Eliquis in place of ASA -Continue pravastatin, physical exercise  Persistent A-fib/flutter -CHA2DS2-VASc at least 5 (CHF, HTN, age x 1, DM, vascular disease). -Previously evaluated by EP for catheter ablation but was not felt to be a candidate -Today EKG a flutter heart rate 70 -Plan per EP is rate control, controlled at this current visit -Continue Eliquis 5 mg twice daily, digoxin 0.125 mg metoprolol 200 mg  Hypertension -BP 122/68, controlled -No dizziness, lightheadedness -Encouraged him to take his blood pressures at home -Continue current medication regimen  Hyperlipidemia -LDL 29 on 11/15/2022 -Continue pravastatin 20 mg -Encouraged heart healthy diet  Type 2 diabetes -POCT A1c 7.5 on 03/21/2023 -Managed by PCP  Tobacco abuse -He continues to smoke -Contemplation stage, encouraged cessation                  Dispo:  Return in about 6 months (around 12/24/2023).  Signed, Denyce Robert, AGNP-C

## 2023-06-25 ENCOUNTER — Encounter: Payer: Self-pay | Admitting: Physician Assistant

## 2023-06-25 ENCOUNTER — Ambulatory Visit: Payer: PPO | Attending: Physician Assistant | Admitting: Emergency Medicine

## 2023-06-25 VITALS — BP 135/80 | HR 70 | Ht 71.0 in | Wt 148.6 lb

## 2023-06-25 DIAGNOSIS — I10 Essential (primary) hypertension: Secondary | ICD-10-CM

## 2023-06-25 DIAGNOSIS — I5032 Chronic diastolic (congestive) heart failure: Secondary | ICD-10-CM | POA: Diagnosis not present

## 2023-06-25 DIAGNOSIS — Z720 Tobacco use: Secondary | ICD-10-CM | POA: Diagnosis not present

## 2023-06-25 DIAGNOSIS — I482 Chronic atrial fibrillation, unspecified: Secondary | ICD-10-CM

## 2023-06-25 DIAGNOSIS — I2510 Atherosclerotic heart disease of native coronary artery without angina pectoris: Secondary | ICD-10-CM

## 2023-06-25 DIAGNOSIS — E785 Hyperlipidemia, unspecified: Secondary | ICD-10-CM

## 2023-06-25 DIAGNOSIS — I4892 Unspecified atrial flutter: Secondary | ICD-10-CM | POA: Diagnosis not present

## 2023-06-25 NOTE — Patient Instructions (Signed)
Medication Instructions:  Your Physician recommend you continue on your current medication as directed.    *If you need a refill on your cardiac medications before your next appointment, please call your pharmacy*   Lab Work: Your provider would like for you to return in 1-2 days  to have the following labs drawn: CBC, BMP and digoxin level.   Please go to [facility] [address] [address], [place] [address] You do not need an appointment.  They are open from 7:30 am-4 pm.  Lunch from 1:00 pm- 2:00 pm You NOT need to be fasting.  If you have any lab test that is abnormal or we need to change your treatment, we will call you to review the results.   Follow-Up: At [facility], you and your health needs are our priority.  As part of our continuing mission to provide you with exceptional heart care, we have created designated Provider Care Teams.  These Care Teams include your primary Cardiologist (physician) and Advanced Practice Providers (APPs -  Physician Assistants and Nurse Practitioners) who all work together to provide you with the care you need, when you need it.  We recommend signing up for the patient portal called "MyChart".  Sign up information is provided on this After Visit Summary.  MyChart is used to connect with patients for Virtual Visits (Telemedicine).  Patients are able to view lab/test results, encounter notes, upcoming appointments, etc.  Non-urgent messages can be sent to your provider as well.   To learn more about what you can do with MyChart, go to [email or web address].    Your next appointment:   6 month(s)  Provider:   You may see Julien Nordmann, MD or one of the following Advanced Practice Providers on your designated Care Team:   Eula Listen, [place]

## 2023-06-29 ENCOUNTER — Other Ambulatory Visit: Payer: PPO | Admitting: Pharmacist

## 2023-06-29 NOTE — Progress Notes (Unsigned)
06/29/2023 Name: Mark Nash MRN: [id number] DOB: [birth year]  Chief Complaint  Patient presents with   Medication Management   Medication Assistance    Mark Nash is a 67 y.o. year old male who presented for a telephone visit.   They were referred to the pharmacist by their PCP for assistance in managing diabetes, medication access, and complex medication management.    Subjective:  Care Team: Primary Care Provider: Alba Cory, MD ; Next Scheduled Visit: 07/20/2023 Cardiologist: Julien Nordmann MD; Next Scheduled Visit: 12/24/2023 Pain Management: Ronita Hipps, MD  Medication Access/Adherence  Current Pharmacy:  [facility] [address] [place] - [address] [place] [address] Phone: [phone] Fax: [phone]  [facility] [phone] [place], [place] - [address] [place] [address] Phone: [phone] Fax: [phone]  [facility] - [place], [place] - [address] [place] [address] Phone: [phone] Fax: [phone]   Patient reports affordability concerns with their medications: Yes  Patient reports access/transportation concerns to their pharmacy: No  Patient reports adherence concerns with their medications:  No    Reports Eliquis more difficult to afford since has entered the coverage gap of his Medicare prescription coverage. Discuss option to apply for Eliquis patient assistance if has met out of pocket expenditure requirement for calendar year. However, patient declines as enrollment would only last through end of current calendar year  From review of chart, note patient seen by PA Danelle Berry on 10/24 related to cough/headache/nasal drainage - Today patient reports feeling significantly better since completed course of doxycycline  Diabetes:  Current medications:  - Farxiga 10 mg daily - Levemir - 8 units nightly at bedtime Note Levemir is being  discontinued by the manufacturer. Patient reports recently picked up a refill; currently as >1 month supply remaining  Medications tried in the past: Steglatro, metformin  Reports has a Accu-Chek Guide meter, but denies checking home blood sugar recently  Patient reports hypoglycemic s/sx including dizziness, shakiness, sweating.   Current meal patterns:  - Breakfast: piece of toast and piece of toast with pimento cheese of chicken salad - Lunch: 1/2 cheese sandwich and sometimes with can of green beans or potato chips - Supper: sweet potatoes and chicken sandwich or tuna sandwich - Snacks: potato chips or graham crackers and peanut butter or carb-smart ice cream - Drinks: water  Current physical activity: starting back walking again gradually  Current medication access support: enrolled in patient assistance for Farxiga through [employer] - Reports was unable to complete AZ[employer]Me Writer for re-enrollment as does not text or use email  Heart Failure/Hypertension:  Current medications:  ACEi/ARB/ARNI: losartan 25 mg -1/2 tablet daily. (Skips the dose if systolic BP less than 110 mmHg) SGLT2i: Farxiga 10 mg daily Beta blocker: metoprolol ER 200 mg daily Other: digoxin 0.125 mg - 1/2 tablet (0.0625 mg) daily  Reports has upper arm blood pressure monitor, but denies checking recently Denies checking home weight recently, but has home scale  Previous therapies tried: torsemide and Klor-Con - reports Cardiology aware that he is not taking these  Patient denies volume overload signs or symptoms including shortness of breath, lower extremity edema, increased use of pillows at night  Denies symptom of hypotension  Current physical activity: starting back walking again gradually  Current medication access support: enrolled in patient assistance for Farxiga through [employer]Me - Reports was unable to complete AZ[employer]Me Writer  for re-enrollment as does not text or use email  Atrial  Fibrillation:  Current medications: Rate Control: metoprolol ER 200 mg daily - digoxin 0.125 mg - 1/2 tablet (0.0625 mg) daily Anticoagulation Regimen: Eliquis 5 mg twice daily  CHA2DS2-VASc Score = 5  The patient's score is based upon: CHF History: 1 HTN History: 1 Diabetes History: 1 Stroke History: 0 Vascular Disease History: 1 Age Score: 1 Gender Score: 0  Denies monitoring home blood pressure recently  Patient denies hypotensive s/sx including dizziness, lightheadedness.  Current physical activity: starting back walking again gradually  Current medication access support: None.    COPD:  Current medications: - Breztri inhaler - 2 puffs twice daily  Confirms rinses and spits out after each use  Current medication access support: enrolled in patient assistance for Breztri through AZ[employer]Me - Reports was unable to complete AZ[employer]Me Digital Assistant for re-enrollment as does not text or use email  Objective:  Lab Results  Component Value Date   HGBA1C 7.5 (A) 03/21/2023    Lab Results  Component Value Date   CREATININE 0.87 04/17/2023   BUN 13 04/17/2023   NA 137 04/17/2023   K 4.6 04/17/2023   CL 100 04/17/2023   CO2 26 04/17/2023    Lab Results  Component Value Date   CHOL 103 11/15/2022   HDL 61 11/15/2022   LDLCALC 29 11/15/2022   TRIG 46 11/15/2022   CHOLHDL 1.7 11/15/2022   BP Readings from Last 3 Encounters:  06/25/23 135/80  06/21/23 122/68  04/17/23 110/70   Pulse Readings from Last 3 Encounters:  06/25/23 70  06/21/23 64  04/17/23 70     Medications Reviewed Today     Reviewed by [name], RPH-CPP (Pharmacist) on 06/29/23 at 1027  Med List Status: <None>   Medication Order Taking? Sig Documenting Provider Last Dose Status Informant  apixaban (ELIQUIS) 5 MG TABS tablet [id number] Yes Take 1 tablet by mouth twice daily [name], MD Taking Active   blood glucose meter kit and supplies KIT [id number]  Dispense based on  patient and insurance preference. Use up to tid E11.65, LON:99 Alba Cory, MD  Active Self  Budeson-Glycopyrrol-Formoterol (BREZTRI AEROSPHERE) 160-9-4.8 MCG/ACT Sandrea Matte [id number] Yes Inhale 2 puffs into the lungs 2 (two) times daily. Patient receives via AZ[employer]ME Patient Assistance. Alba Cory, MD Taking Active Self  cetirizine (ZYRTEC) 10 MG tablet [id number] Yes Take 1 tablet (10 mg total) by mouth at bedtime as needed for allergies or rhinitis. Danelle Berry, PA-C Taking Active   dapagliflozin propanediol (FARXIGA) 10 MG TABS tablet [id number] Yes Take 1 tablet (10 mg total) by mouth daily. Alba Cory, MD Taking Active Self  digoxin (LANOXIN) 0.125 MG tablet [id number] Yes Take 0.5 tablets (0.0625 mg total) by mouth daily. Sondra Barges, PA-C Taking Active   gabapentin (NEURONTIN) 300 MG capsule [id number] Yes Take 300-600 mg by mouth See admin instructions. Take 1 capsule (300 mg) by mouth in the morning, take 1 capsule (300 mg) by mouth at noon, and 2 capsules (600 mg) by mouth in the evening [provider] Taking Active Self  guaiFENesin (MUCINEX) 600 MG 12 hr tablet [id number] Yes Take 1 tablet (600 mg total) by mouth 2 (two) times daily as needed for cough or to loosen phlegm. Danelle Berry, PA-C Taking Active   Lancet Devices MISC [id number]  1 each by Does not apply route 3 (three) times daily as needed. What ins will cover dx:E11.65, LON:99 tid Margarita Mail, DO  Active Self  LEVEMIR 100 UNIT/ML injection [id number] Yes INJECT 8 UNITS SUBCUTANEOUSLY AT BEDTIME Berniece Salines, FNP Taking Active   losartan (COZAAR) 25 MG tablet [id number] Yes Take 0.5 tablets (12.5 mg total) by mouth daily. Skip the dose if systolic BP less than 110 mmHg Reather Littler D, NP Taking Active   metoprolol succinate (TOPROL-XL) 200 MG 24 hr tablet [id number] Yes Take 1 tablet (200 mg total) by mouth daily. Take with or immediately following a meal. Gillis Santa, MD Taking Active   potassium chloride SA  (KLOR-CON M) 20 MEQ tablet [id number] No Take 1 tablet (20 mEq total) by mouth daily.  Patient not taking: Reported on 06/25/2023   Alba Cory, MD Not Taking Active   pravastatin (PRAVACHOL) 20 MG tablet [id number] Yes Take 1 tablet (20 mg total) by mouth daily with supper. Alba Cory, MD Taking Active Self  tiZANidine (ZANAFLEX) 4 MG tablet [id number] Yes Take 4 mg by mouth every 12 (twelve) hours. Yevonne Pax, MD Taking Active Self           Med Note Dollene Primrose   Wed Feb 07, 2023  2:38 PM)    torsemide (DEMADEX) 20 MG tablet [id number] No Take 1 tablet (20 mg total) by mouth daily before breakfast. With potassium pill and hold losartan when you take this medication  Patient not taking: Reported on 06/25/2023   Alba Cory, MD Not Taking Active   traMADol (ULTRAM) 50 MG tablet [id number] Yes Take 50 mg by mouth every 12 (twelve) hours. Yevonne Pax, MD Taking Active Self  triamcinolone cream (KENALOG) 0.1 % [id number] Yes Apply topically 2 (two) times daily. [provider] Taking Active   Vitamin D, Ergocalciferol, (DRISDOL) 1.25 MG (50000 UNIT) CAPS capsule [id number] Yes TAKE 1 CAPSULE BY MOUTH EVERY TWO Liam Rogers, MD Taking Active Self  Med List Note Earlyne Iba, RN 10/03/18 1438): UDS 10/03/18              Assessment/Plan:   Comprehensive medication review performed; medication list updated in electronic medical record - Caution patient for risk of dizziness/sedation with gabapentin, tizanidine or tramadol particularly if taken in combination Patient verbalizes understanding; denies dizziness or lightheadedness   Patient to return to Cardiology for ordered lab work - Patient confirms plans to complete labs within the next week  Will collaborate with PCP and CPhT for support to patient with re-enrollment in patient assistance from AZ[employer]Me for both [name] and Farxiga for 2025  Diabetes: - Reviewed long term cardiovascular and renal  outcomes of uncontrolled blood sugar - Reviewed goal A1c, goal fasting, and goal 2 hour post prandial glucose - Reviewed dietary modifications including importance of having regular well-balanced meals and snacks throughout the day, while controlling carbohydrate portion sizes            Encourage to review nutrition labels for carbohydrate content of foods - Counsel patient on s/s of low blood sugar and how to treat lows - Note Levemir insulin is being discontinued by [employer]  this year. Discuss with patient alternative medication management options including switch to an alternative long-acting insulin, Januvia or Ozempic (as patient interested in a treatment alternative that he could receive through patient assistance Collaborate with PCP regarding medication management options. Dr. Carlynn Purl recommends switch from Levemir to [employer] no longer accepting applications for Ozempic patient assistance for 2024 calendar year - Will collaborate with PCP and CPhT for support to patient with re-enrollment in patient assistance  from [employer] for [employer] for 2025 Patient to review manufacturer "how to take" video for [employer] prior to our next telephone appointment and reach out to provider or pharmacist with any questions about this device/medication - Recommend to check glucose, keep log of results and have this record to review at upcoming medical appointments. Patient to contact provider office sooner if needed for readings outside of established parameters or symptoms  Heart Failure/Hypertension: - Reviewed appropriate blood pressure monitoring technique and reviewed goal blood pressure - Reviewed to weigh daily and when to contact cardiology with weight gain - Reviewed dietary modifications including: Encourage to review nutrition labels for sodium content of foods - Recommend to monitor home blood pressure, keep log of results and have this record to review at  upcoming medical appointments. Patient to contact provider office sooner if needed for readings outside of established parameters or symptoms  Atrial Fibrillation: - Recommend to monitor home blood pressure, keep log of results and have this record to review at upcoming medical appointments. Patient to contact provider office sooner if needed for readings outside of established parameters or symptoms - Patient plans to follow up with Cardiology to see if any samples of Eliquis available to help with affordability while in the coverage gap, but otherwise plans to pick up refills from pharmacy  COPD: - Discuss inhaler technique, including importance of rinsing out his mouth after each use of Breztri   Follow Up Plan: Clinical Pharmacist will follow up with patient by telephone on 09/05/2023 at 1:00 PM   Estelle Grumbles, PharmD, [facility] [phone]

## 2023-07-03 NOTE — Patient Instructions (Signed)
Goals Addressed             This Visit's Progress    Pharmacy Goals       The goal A1c is less than 7%. This is the best way to reduce the risk of the long term complications of diabetes, including heart disease, kidney disease, eye disease, strokes, and nerve damage. An A1c of less than 7% corresponds with fasting sugars less than 130 and 2 hour after meal sugars less than 180. Please check your blood sugar daily.   Please watch the mail for an envelope from [facility] containing the patient assistance program application. Please complete this application and bring to office to have it faxed back to Attention: Linward Foster at Fax # [phone] along with a copy of your Medicare Part D prescription card and a copy of your proof of income document.  If you need to call Joni Reining, you can reach her at [phone].  Thank you!  Estelle Grumbles, PharmD, [facility] [phone]

## 2023-07-04 ENCOUNTER — Telehealth: Payer: Self-pay | Admitting: Internal Medicine

## 2023-07-04 DIAGNOSIS — I482 Chronic atrial fibrillation, unspecified: Secondary | ICD-10-CM

## 2023-07-04 DIAGNOSIS — I4892 Unspecified atrial flutter: Secondary | ICD-10-CM | POA: Diagnosis not present

## 2023-07-04 NOTE — Telephone Encounter (Signed)
Patient calling the office for samples of medication:   1.  What medication and dosage are you requesting samples for? Eliquis 5 MG tabs  2.  Are you currently out of this medication?  Patient came by office for labs States that he is in the donut hole for Eliquis and could only afford the one month supply refill [facility] is helping him with assistance and asked him to check if we can help him with samples til the end of the year Please call to discuss

## 2023-07-05 LAB — CBC
Hematocrit: 49.3 % (ref 37.5–51.0)
Hemoglobin: 16.1 g/dL (ref 13.0–17.7)
MCH: 29.7 pg (ref 26.6–33.0)
MCHC: 32.7 g/dL (ref 31.5–35.7)
MCV: 91 fL (ref 79–97)
Platelets: 158 10*3/uL (ref 150–450)
RBC: 5.43 x10E6/uL (ref 4.14–5.80)
RDW: 15.1 % (ref 11.6–15.4)
WBC: 7.1 10*3/uL (ref 3.4–10.8)

## 2023-07-05 LAB — BASIC METABOLIC PANEL
BUN/Creatinine Ratio: 18 (ref 10–24)
BUN: 16 mg/dL (ref 8–27)
CO2: 24 mmol/L (ref 20–29)
Calcium: 9.5 mg/dL (ref 8.6–10.2)
Chloride: 100 mmol/L (ref 96–106)
Creatinine, Ser: 0.91 mg/dL (ref 0.76–1.27)
Glucose: 248 mg/dL — ABNORMAL HIGH (ref 70–99)
Potassium: 4.6 mmol/L (ref 3.5–5.2)
Sodium: 141 mmol/L (ref 134–144)
eGFR: 92 mL/min/{1.73_m2} (ref >59)

## 2023-07-05 LAB — DIGOXIN LEVEL: Digoxin, Serum: 0.5 ng/mL (ref 0.5–0.9)

## 2023-07-05 MED ORDER — APIXABAN 5 MG PO TABS
5.0000 mg | ORAL_TABLET | Freq: Two times a day (BID) | ORAL | Status: DC
Start: 2023-07-05 — End: 2023-12-19

## 2023-07-05 NOTE — Telephone Encounter (Signed)
Pt made aware samples available for pick up.

## 2023-07-09 DIAGNOSIS — M5416 Radiculopathy, lumbar region: Secondary | ICD-10-CM | POA: Diagnosis not present

## 2023-07-09 DIAGNOSIS — M545 Low back pain, unspecified: Secondary | ICD-10-CM | POA: Diagnosis not present

## 2023-07-09 DIAGNOSIS — M961 Postlaminectomy syndrome, not elsewhere classified: Secondary | ICD-10-CM | POA: Diagnosis not present

## 2023-07-09 DIAGNOSIS — E119 Type 2 diabetes mellitus without complications: Secondary | ICD-10-CM | POA: Diagnosis not present

## 2023-07-09 DIAGNOSIS — Z716 Tobacco abuse counseling: Secondary | ICD-10-CM | POA: Diagnosis not present

## 2023-07-09 DIAGNOSIS — M5430 Sciatica, unspecified side: Secondary | ICD-10-CM | POA: Diagnosis not present

## 2023-07-09 DIAGNOSIS — Z79891 Long term (current) use of opiate analgesic: Secondary | ICD-10-CM | POA: Diagnosis not present

## 2023-07-09 DIAGNOSIS — F1120 Opioid dependence, uncomplicated: Secondary | ICD-10-CM | POA: Diagnosis not present

## 2023-07-09 DIAGNOSIS — I10 Essential (primary) hypertension: Secondary | ICD-10-CM | POA: Diagnosis not present

## 2023-07-09 DIAGNOSIS — Z720 Tobacco use: Secondary | ICD-10-CM | POA: Diagnosis not present

## 2023-07-09 DIAGNOSIS — G894 Chronic pain syndrome: Secondary | ICD-10-CM | POA: Diagnosis not present

## 2023-07-09 DIAGNOSIS — F209 Schizophrenia, unspecified: Secondary | ICD-10-CM | POA: Diagnosis not present

## 2023-07-19 NOTE — Progress Notes (Signed)
Name: Mark Nash   MRN: [id number]    DOB: 05-27-1956   Date:07/20/2023       Progress Note  Subjective  Chief Complaint  Follow Up  HPI  Lumbar radiculitis: he had back surgery in 06/2016 and 08/2016. He is currently doing okay. Currently seeing  Dr. Welton Flakes and is taking , Tramadol, Tizanidine and higher dose gabapentin 300 mg morning, lunch and 2 at night, back pain is well controlled at this time 7/10 and stable - a little worse today. He states aggravated by the change in the weather    DMII: he is on Levemir  8 units and [place].  A1C today is high at 10.5 %, he is not sure if he has changed his diet,  but has not been checking his fsbs at home and has not been walking since Summer 2024 after he left [facility]. We will try adding Ozempic - but needs to make sure he does not lose a lot of weight.  [name] He is on ARB  , statins and gabapentin for neuropathy.  He also has PVD.   Hoarseness: going on since Summer 2024, he uses Breo and rinses his mouth afterwards every time, we will refer him to ENT  Malnutrition: baseline weight is 160 lbs, he went down to 130's while admitted in May, weight is gradually improving but still 16 lbs below normal.Weight is gradually improving, today is up to 151 lbs  PAD: he had multiple episodes of cellulitis in 2022 and surgeon decided to hold off on surgical procedure , he is on medical management. He continues to have claudication and states symptoms are getting worse so he has not been walking as often.  Advised him to resume regular physical activity    COPD Moderate : he always has daily morning  productive cough. He has SOB  with activity but doing better since taking maintenance medication,- Brezti  .  He is still smoking 1.5 - 2 packs daily, still not able to quit smoking    Hyperlipidemia: taking Pravastatin 20 mg daily and denies myalgias. Last LDL was 29. Sending refill to pharmacy    HTN: taking Metoprolol and low dose Losartan  , BP is at goal     Schizophrenia/paranoid type : denies any hallucinations, no depressed mood or suicidal thoughts or ideation, not on medication and refuses to see Psychiatrist. He states he helps his family.  Mother died in 2023-02-27 and brother died 04-11-23he still has one sister. Unchanged    Hepatitis C antibody positive: he has a remote history of drug use from age 67 till 43. Used injectable drug use at the time, he has positive hepatitis C , he went to see GI but did not discuss hepatitis C. He has seen GI, he has a large polyp removed 2019 and was supposed to go back for repeat colonoscopy but he keeps postponing , today he said he is not interested in going back    Chronic Constipation: he states bowel movements have improvement   He had EGD and colonoscopy in 2019 , past due for repeat testing secondary to large sigmoid polyp, he refuses to go    Senile purpura: both arms.  Reassurance given   Afib/CHF: CHADVasc score of 4, he is under the care of cardiologist  ,he is now on metoprolol XL, GL-2 agonist , digoxin and  Eliquis . He bruises easily .    Patient Active Problem List   Diagnosis Date Noted  HFrEF (heart failure with reduced ejection fraction) (HCC) 01/30/2023   Chronic combined systolic and diastolic congestive heart failure (HCC) 01/28/2023   Pressure injury of skin 01/28/2023   Chronic atrial flutter (HCC) 01/26/2023   Acute systolic heart failure (HCC) 01/26/2023   Acute respiratory failure with hypoxia (HCC) 01/24/2023   Rhinovirus infection 01/23/2023   COPD exacerbation (HCC) 01/21/2023   Senile purpura (HCC) 11/15/2022   Chronic a-fib (HCC) 03/15/2022   Type 2 diabetes mellitus with diabetic polyneuropathy, with long-term current use of insulin (HCC) 03/16/2021   DDD (degenerative disc disease), lumbar 03/06/2019   Primary osteoarthritis involving multiple joints 03/06/2019   Lumbar radiculopathy 11/05/2018   Lumbar post-laminectomy syndrome 11/05/2018   Diabetic  polyneuropathy associated with type 2 diabetes mellitus (HCC) 11/05/2018   CLE (columnar lined esophagus)    Gastric irritation    Duodenum ulcer    History of colonic polyps    Polyp of sigmoid colon    Diverticulosis of large intestine without diverticulitis    Benign neoplasm of ascending colon    Osteoarthritis of both hands 09/04/2017   Chronic pain of left knee 09/04/2017   Lymphedema 07/07/2016   Chronic hepatitis C without hepatic coma (HCC) 06/01/2016   Lumbar herniated disc 11/24/2015   Thrombocytopenia (HCC) 11/11/2015   Hepatitis C antibody test positive 07/13/2015   Chronic constipation 07/13/2015   Constipation 07/13/2015   Diabetes mellitus with coincident hypertension (HCC) 03/08/2015   Dyslipidemia 03/08/2015   Paranoid schizophrenia (HCC) 03/08/2015   Restless leg 03/08/2015   Callus of foot 03/08/2015   Claudication (HCC) 03/08/2015   Chronic obstructive pulmonary disease (HCC) 03/08/2015   Corn or callus 03/08/2015   Essential (primary) hypertension 03/08/2015   HLD (hyperlipidemia) 03/08/2015   Peripheral vascular disease (HCC) 03/08/2015   Polyneuropathy 03/08/2015   Current tobacco use 03/08/2015   Vitamin D deficiency 04/26/2009   Avitaminosis D 04/26/2009    Past Surgical History:  Procedure Laterality Date   BACK SURGERY     CARDIOVERSION N/A 05/20/2021   Procedure: CARDIOVERSION;  Surgeon: Iran Ouch, MD;  Location: [facility] ORS;  Service: Cardiovascular;  Laterality: N/A;   CATARACT EXTRACTION W/PHACO Right 10/31/2021   Procedure: CATARACT EXTRACTION PHACO AND INTRAOCULAR LENS PLACEMENT (IOC) RIGHT DIABETIC;  Surgeon: Nevada Crane, MD;  Location: [facility];  Service: Ophthalmology;  Laterality: Right;  Diabetic 4.34 00:42.8   COLONOSCOPY WITH PROPOFOL N/A 09/07/2017   Procedure: COLONOSCOPY WITH PROPOFOL;  Surgeon: Pasty Spillers, MD;  Location: [facility];  Service: Endoscopy;  Laterality: N/A;    ESOPHAGOGASTRODUODENOSCOPY (EGD) WITH PROPOFOL N/A 12/07/2017   Procedure: ESOPHAGOGASTRODUODENOSCOPY (EGD) WITH PROPOFOL;  Surgeon: Pasty Spillers, MD;  Location: [facility] END[facility];  Service: Endoscopy;  Laterality: N/A;   EYE SURGERY     FLEXIBLE SIGMOIDOSCOPY N/A 11/09/2017   Procedure: FLEXIBLE SIGMOIDOSCOPY;  Surgeon: Pasty Spillers, MD;  Location: [facility] END[facility];  Service: Endoscopy;  Laterality: N/A;   HERNIA REPAIR     inguinal   INGUINAL HERNIA REPAIR Bilateral 06/21/2018   Procedure: LAPAROSCOPIC BILATERAL INGUINAL HERNIA REPAIR;  Surgeon: Ancil Linsey, MD;  Location: [facility];  Service: General;  Laterality: Bilateral;   L3 TO L5 LAMINECTOMY FOR DECOMPRESSION  07/17/2016   [facility]   REPAIR OF CEREBROSPINAL FLUID LEAK N/A 09/08/2016   Procedure: Lumbar wound exploration, repair of pseudomenigocele;  Surgeon: Loura Halt Ditty, MD;  Location: [facility];  Service: Neurosurgery;  Laterality: N/A;  Lumbar wound exploration, repair of pseudomenigocele  RIGHT/LEFT HEART CATH AND CORONARY ANGIOGRAPHY N/A 01/29/2023   Procedure: RIGHT/LEFT HEART CATH AND CORONARY ANGIOGRAPHY;  Surgeon: Iran Ouch, MD;  Location: [facility];  Service: Cardiovascular;  Laterality: N/A;   SINUSOTOMY      Family History  Problem Relation Age of Onset   Heart failure Mother    Hypertension Mother    Diverticulitis Mother    Colitis Mother    Diabetes Father    Hypertension Father    Heart disease Father    Diabetes Sister    Heart failure Brother    Diverticulitis Brother     Social History   Tobacco Use   Smoking status: Every Day    Current packs/day: 1.50    Average packs/day: 1.5 packs/day for 46.0 years (69.0 ttl pk-yrs)    Types: Cigarettes   Smokeless tobacco: Never  Substance Use Topics   Alcohol use: No    Alcohol/week: 0.0 standard drinks of alcohol     Current Outpatient Medications:    apixaban (ELIQUIS) 5 MG TABS tablet,  Take 1 tablet (5 mg total) by mouth 2 (two) times daily., Disp: 28 tablet, Rfl:    blood glucose meter kit and supplies KIT, Dispense based on patient and insurance preference. Use up to tid E11.65, LON:99, Disp: 1 each, Rfl: 0   Budeson-Glycopyrrol-Formoterol (BREZTRI AEROSPHERE) 160-9-4.8 MCG/ACT AERO, Inhale 2 puffs into the lungs 2 (two) times daily. Patient receives via [employer] Patient Assistance., Disp: 32.1 g, Rfl: 3   cetirizine (ZYRTEC) 10 MG tablet, Take 1 tablet (10 mg total) by mouth at bedtime as needed for allergies or rhinitis., Disp: 30 tablet, Rfl: 1   dapagliflozin propanediol (FARXIGA) 10 MG TABS tablet, Take 1 tablet (10 mg total) by mouth daily., Disp: 90 tablet, Rfl: 3   digoxin (LANOXIN) 0.125 MG tablet, Take 0.5 tablets (0.0625 mg total) by mouth daily., Disp: 90 tablet, Rfl: 3   gabapentin (NEURONTIN) 300 MG capsule, Take 300-600 mg by mouth See admin instructions. Take 1 capsule (300 mg) by mouth in the morning, take 1 capsule (300 mg) by mouth at noon, and 2 capsules (600 mg) by mouth in the evening, Disp: , Rfl:    Lancet Devices MISC, 1 each by Does not apply route 3 (three) times daily as needed. What ins will cover dx:E11.65, LON:99 tid, Disp: 100 each, Rfl: 12   LEVEMIR 100 UNIT/ML injection, INJECT 8 UNITS SUBCUTANEOUSLY AT BEDTIME, Disp: 10 mL, Rfl: 0   losartan (COZAAR) 25 MG tablet, Take 0.5 tablets (12.5 mg total) by mouth daily. Skip the dose if systolic BP less than 110 mmHg, Disp: 30 tablet, Rfl: 5   metoprolol succinate (TOPROL-XL) 200 MG 24 hr tablet, Take 1 tablet (200 mg total) by mouth daily. Take with or immediately following a meal., Disp: 30 tablet, Rfl: 11   potassium chloride SA (KLOR-CON M) 20 MEQ tablet, Take 1 tablet (20 mEq total) by mouth daily., Disp: 30 tablet, Rfl: 0   tiZANidine (ZANAFLEX) 4 MG tablet, Take 4 mg by mouth every 12 (twelve) hours., Disp: , Rfl:    torsemide (DEMADEX) 20 MG tablet, Take 1 tablet (20 mg total) by mouth daily before  breakfast. With potassium pill and hold losartan when you take this medication, Disp: 30 tablet, Rfl: 0   traMADol (ULTRAM) 50 MG tablet, Take 50 mg by mouth every 12 (twelve) hours., Disp: , Rfl:    triamcinolone cream (KENALOG) 0.1 %, Apply topically 2 (two) times daily., Disp: , Rfl:  pravastatin (PRAVACHOL) 20 MG tablet, Take 1 tablet (20 mg total) by mouth daily with supper., Disp: 90 tablet, Rfl: 3   Vitamin D, Ergocalciferol, (DRISDOL) 1.25 MG (50000 UNIT) CAPS capsule, Take 1 capsule (50,000 Units total) by mouth every 7 (seven) days., Disp: 6 capsule, Rfl: 1  Allergies  Allergen Reactions   Penicillins Hives and Swelling    As a child. Has patient had a PCN reaction causing immediate rash, facial/tongue/throat swelling, SOB or lightheadedness with hypotension: YES  Has patient had a PCN reaction causing severe rash involving mucus membranes or skin necrosis: UNKNOWN Has patient had a PCN reaction that required hospitalization: UNKNOWN Has patient had a PCN reaction occurring within the last 10 years: NO   Aspirin Nausea Only   Ibuprofen Nausea Only   Lyrica [Pregabalin] Nausea Only   Acetaminophen Nausea Only    I personally reviewed active problem list, medication list, allergies, family history, social history, health maintenance with the patient/caregiver today.   ROS  Ten systems reviewed and is negative except as mentioned in HPI    Objective  Vitals:   07/20/23 1407  BP: 120/80  Pulse: 64  Resp: 18  Temp: 97.7 F (36.5 C)  TempSrc: Oral  SpO2: 98%  Weight: 151 lb 4.8 oz (68.6 kg)  Height: 5\' 11"  (1.803 m)    Body mass index is 21.1 kg/m.  Physical Exam  Constitutional: Patient appears well-developed  No distress.  HEENT: head atraumatic, normocephalic, pupils equal and reactive to light, neck supple Cardiovascular: Normal rate, regular rhythm and normal heart sounds.  No murmur heard. No BLE edema. Stasis dermatitis both legs Pulmonary/Chest: Effort  normal and breath sounds normal. No respiratory distress. Abdominal: Soft.  There is no tenderness. Psychiatric: Patient has a normal mood and affect. behavior is normal. Judgment and thought content normal.     PHQ2/9:    07/20/2023    2:08 PM 06/21/2023   10:45 AM 03/21/2023    1:33 PM 02/21/2023    3:31 PM 02/07/2023    2:38 PM  Depression screen PHQ 2/9  Decreased Interest 0 0 0 0 0  Down, Depressed, Hopeless 0 0 0 0 0  PHQ - 2 Score 0 0 0 0 0  Altered sleeping 0 0 0    Tired, decreased energy 0 0 0    Change in appetite 0 0 0    Feeling bad or failure about yourself  0 0 0    Trouble concentrating 0 0 0    Moving slowly or fidgety/restless 0 0 0    Suicidal thoughts 0 0 0    PHQ-9 Score 0 0 0    Difficult doing work/chores  Not difficult at all       phq 9 is negative   Fall Risk:    07/20/2023    2:08 PM 06/21/2023   10:45 AM 03/21/2023    1:33 PM 02/21/2023    3:31 PM 02/07/2023    2:38 PM  Fall Risk   Falls in the past year? 0 0 0 0 0  Number falls in past yr:  0  0 0  Injury with Fall?  0  0 0  Risk for fall due to : No Fall Risks  No Fall Risks  Impaired balance/gait  Follow up Falls prevention discussed  Falls prevention discussed Falls prevention discussed Falls prevention discussed      Functional Status Survey: Is the patient deaf or have difficulty hearing?: No Does the patient have  difficulty seeing, even when wearing glasses/contacts?: No Does the patient have difficulty concentrating, remembering, or making decisions?: No Does the patient have difficulty walking or climbing stairs?: No Does the patient have difficulty dressing or bathing?: No Does the patient have difficulty doing errands alone such as visiting a doctor's office or shopping?: No    Assessment & Plan  1. Type 2 diabetes mellitus with diabetic polyneuropathy, with long-term current use of insulin (HCC)  - POCT HgB A1C  2. Need for immunization against influenza  - Flu  Vaccine Trivalent High Dose (Fluad)  3. Claudication (HCC)  - pravastatin (PRAVACHOL) 20 MG tablet; Take 1 tablet (20 mg total) by mouth daily with supper.  Dispense: 90 tablet; Refill: 3  4. Peripheral vascular disease (HCC)  - pravastatin (PRAVACHOL) 20 MG tablet; Take 1 tablet (20 mg total) by mouth daily with supper.  Dispense: 90 tablet; Refill: 3  5. Dyslipidemia  - pravastatin (PRAVACHOL) 20 MG tablet; Take 1 tablet (20 mg total) by mouth daily with supper.  Dispense: 90 tablet; Refill: 3  6. Senile purpura (HCC)  Present and reassurance given   7. Chronic a-fib (HCC)  On eliquis   8. Paranoid schizophrenia (HCC)  stable  9. Chronic hepatitis C without hepatic coma (HCC)  Refuses GI visit   10. COPD, moderate (HCC)  Taking Breo  11. Vitamin D deficiency  - Vitamin D, Ergocalciferol, (DRISDOL) 1.25 MG (50000 UNIT) CAPS capsule; Take 1 capsule (50,000 Units total) by mouth every 7 (seven) days.  Dispense: 6 capsule; Refill: 1  12. Hoarseness  - Ambulatory referral to ENT

## 2023-07-20 ENCOUNTER — Ambulatory Visit (INDEPENDENT_AMBULATORY_CARE_PROVIDER_SITE_OTHER): Payer: PPO | Admitting: Family Medicine

## 2023-07-20 ENCOUNTER — Encounter: Payer: Self-pay | Admitting: Family Medicine

## 2023-07-20 VITALS — BP 120/80 | HR 64 | Temp 97.7°F | Resp 18 | Ht 71.0 in | Wt 151.3 lb

## 2023-07-20 DIAGNOSIS — Z23 Encounter for immunization: Secondary | ICD-10-CM | POA: Diagnosis not present

## 2023-07-20 DIAGNOSIS — R490 Dysphonia: Secondary | ICD-10-CM | POA: Diagnosis not present

## 2023-07-20 DIAGNOSIS — B182 Chronic viral hepatitis C: Secondary | ICD-10-CM | POA: Diagnosis not present

## 2023-07-20 DIAGNOSIS — E785 Hyperlipidemia, unspecified: Secondary | ICD-10-CM | POA: Diagnosis not present

## 2023-07-20 DIAGNOSIS — I482 Chronic atrial fibrillation, unspecified: Secondary | ICD-10-CM | POA: Diagnosis not present

## 2023-07-20 DIAGNOSIS — D692 Other nonthrombocytopenic purpura: Secondary | ICD-10-CM

## 2023-07-20 DIAGNOSIS — F200 Paranoid schizophrenia: Secondary | ICD-10-CM

## 2023-07-20 DIAGNOSIS — E1142 Type 2 diabetes mellitus with diabetic polyneuropathy: Secondary | ICD-10-CM

## 2023-07-20 DIAGNOSIS — I739 Peripheral vascular disease, unspecified: Secondary | ICD-10-CM | POA: Diagnosis not present

## 2023-07-20 DIAGNOSIS — J449 Chronic obstructive pulmonary disease, unspecified: Secondary | ICD-10-CM | POA: Diagnosis not present

## 2023-07-20 DIAGNOSIS — Z794 Long term (current) use of insulin: Secondary | ICD-10-CM | POA: Diagnosis not present

## 2023-07-20 DIAGNOSIS — E559 Vitamin D deficiency, unspecified: Secondary | ICD-10-CM | POA: Diagnosis not present

## 2023-07-20 LAB — POCT GLYCOSYLATED HEMOGLOBIN (HGB A1C): Hemoglobin A1C: 10.5 % — AB (ref 4.0–5.6)

## 2023-07-20 MED ORDER — VITAMIN D (ERGOCALCIFEROL) 1.25 MG (50000 UNIT) PO CAPS: 50000.0000 [IU] | ORAL_CAPSULE | ORAL | 1 refills | Status: DC

## 2023-07-20 MED ORDER — PRAVASTATIN SODIUM 20 MG PO TABS: 20.0000 mg | ORAL_TABLET | Freq: Every day | ORAL | 3 refills | Status: DC

## 2023-07-23 ENCOUNTER — Encounter: Payer: Self-pay | Admitting: Pharmacist

## 2023-07-24 ENCOUNTER — Ambulatory Visit: Payer: PPO | Admitting: Family Medicine

## 2023-07-25 ENCOUNTER — Encounter: Payer: PPO | Admitting: *Deleted

## 2023-07-27 ENCOUNTER — Ambulatory Visit: Payer: Self-pay | Admitting: *Deleted

## 2023-07-27 NOTE — Patient Outreach (Signed)
  Care Coordination   Follow Up Visit Note   07/27/2023 Name: Mark Nash MRN: [id number] DOB: 08-12-1956  Mark Nash is a 67 y.o. year old male who sees Alba Cory, MD for primary care. I spoke with  Mark Nash by phone today.  What matters to the patients health and wellness today?  Working to decrease A1C back to goal.     Goals Addressed             This Visit's Progress    COMPLETED: Effective management of COPD   On track    Care Coordination Interventions: Provided patient with basic written and verbal COPD education on self care/management/and exacerbation prevention Advised patient to track and manage COPD triggers Provided instruction about proper use of medications used for management of COPD including inhalers Advised patient to self assesses COPD action plan zone and make appointment with provider if in the yellow zone for 48 hours without improvement   11/29 - A1C increased     COMPLETED: Effective management of DM   Not on track    Care Coordination Interventions: Provided education to patient about basic DM disease process Reviewed medications with patient and discussed importance of medication adherence Discussed plans with patient for ongoing care management follow up and provided patient with direct contact information for care management team          SDOH assessments and interventions completed:  No     Care Coordination Interventions:  Yes, provided   Interventions Today    Flowsheet Row Most Recent Value  Chronic Disease   Chronic disease during today's visit Diabetes, Chronic Obstructive Pulmonary Disease (COPD)  General Interventions   General Interventions Discussed/Reviewed General Interventions Reviewed, Labs, Doctor Visits, Durable Medical Equipment (DME)  Mark Nash was sent to ENT due to chronic hoarseness]  Labs Hgb A1c every 3 months  [Now increased to 10.5]  Doctor Visits Discussed/Reviewed Doctor Visits  Reviewed, PCP, Specialist  [PCP done since last outreach, ENT on 12/13]  Durable Medical Equipment (DME) Glucomoter  [Encouraged to make sure he has supples in order to restart taking blood sugars]  PCP/Specialist Visits Compliance with follow-up visit  Exercise Interventions   Exercise Discussed/Reviewed Physical Activity  Physical Activity Discussed/Reviewed Physical Activity Reviewed  [Discussed the increased of physical activity to help keep blood sugars managed]  Education Interventions   Education Provided Provided Education  Provided Verbal Education On Nutrition, Blood Sugar Monitoring, Exercise, Medication, When to see the doctor, Labs  [Has not been walking since weather has changed, but will restart exercising. Currently on Levemir, but state they will stop manufacturing it next year and plan to change to Ozempic]  Labs Reviewed Hgb A1c  [Educated on goal of less than 7.5]  Nutrition Interventions   Nutrition Discussed/Reviewed Nutrition Reviewed, Portion sizes, Decreasing sugar intake, Adding fruits and vegetables  [Report he's been eating lots of cake, aware of foods to eat to keep blood sugars down]       Follow up plan: Follow up call scheduled for 12/20    Encounter Outcome:  Patient Visit Completed   Mark Langton, RN, MSN, CCM [facility], [facility] RN Care Coordinator Direct Dial: [phone] / Main [phone] Fax [phone] Email: [email or web address]: [email or web address]

## 2023-08-02 ENCOUNTER — Telehealth: Payer: Self-pay

## 2023-08-02 NOTE — Telephone Encounter (Signed)
A user error has taken place: orders placed in error, not carried out on this patient.

## 2023-08-02 NOTE — Telephone Encounter (Signed)
-----   Message from [name] sent at 07/03/2023 11:49 AM EST ----- Would you please assist patient with applying for (both from Dr. Carlynn Purl): - PAP re-enrollment for [name] and [name] from [employer] - PAP for Ozempic from [employer] for 2025 ##Note Ozempic will be a new start for patient, so Rx will be for Ozempic 0.5 mg strength pen  Thank you!  Gentry Fitz

## 2023-08-02 NOTE — Telephone Encounter (Signed)
PAP: Application for Mark Nash has been submitted to PAP Companies: [employer], via fax   PLEASE BE ADVISED I HAVE SUBMITTED CONSENT FOR 2025 [employer]

## 2023-08-02 NOTE — Telephone Encounter (Signed)
Apply on line and fax provider portion for [employer] (Ozempic)

## 2023-08-02 NOTE — Progress Notes (Signed)
Pharmacy Medication Assistance Program Note    08/02/2023  Patient ID: Mark Nash, male   DOB: 29-Mar-1956, 67 y.o.   MRN: [id number]     08/02/2023  Outreach Medication One  Initial Outreach Date (Medication One) 08/02/2023  Manufacturer Medication One [employer] Ozempic   Ozempic  Dose of Ozempic 0.5 mg   0.5 mg  Type of Sport and exercise psychologist     Multiple values from one day are sorted in reverse-chronological order     [employer]

## 2023-08-02 NOTE — Telephone Encounter (Signed)
PAP: Application for Ozempic has been submitted to PAP Companies: [employer], online  Please be advised that I have submmited electronically  and has fax the provider portion to the provider office.

## 2023-08-03 NOTE — Telephone Encounter (Signed)
Received provider portion, indexing to pt's media tab, provider also faxed directly to [employer]

## 2023-08-08 NOTE — Telephone Encounter (Signed)
Received provider portion, indexing to  pt's media tab, provider also faxed directly  to [employer].    Please be advised

## 2023-08-09 NOTE — Telephone Encounter (Signed)
PAP: Patient assistance application for Ozempic has been approved by PAP Companies: [employer] from 08/29/2023 to 08/27/2024. Medication should be delivered to PAP Delivery: Provider's office For further shipping updates, please contact [employer] at [phone] Pt ID is: NO ID   PLEASE BE ADVISED I HAVE INFORMED PT OF APPROVAL   Melanee Spry CPhT Rx [facility] [phone]

## 2023-08-09 NOTE — Telephone Encounter (Signed)
PAP: Patient assistance application for [name] and [name] has been approved by PAP Companies: [employer] from 08/09/2023 to 08/27/2024. Medication should be delivered to PAP Delivery: Home For further shipping updates, please contact [employer] ([employer]) at [phone] Pt ID is: NO ID   PLEASE BE ADVISED PT HAS ACTIVE RENEWAL AND REFILLS FOR ONE YEAR

## 2023-08-10 DIAGNOSIS — F17200 Nicotine dependence, unspecified, uncomplicated: Secondary | ICD-10-CM | POA: Diagnosis not present

## 2023-08-10 DIAGNOSIS — R490 Dysphonia: Secondary | ICD-10-CM | POA: Diagnosis not present

## 2023-08-10 DIAGNOSIS — K219 Gastro-esophageal reflux disease without esophagitis: Secondary | ICD-10-CM | POA: Diagnosis not present

## 2023-08-16 NOTE — Telephone Encounter (Signed)
Pt PAP [employer] Ozempic Approve thru 08/27/24

## 2023-08-17 ENCOUNTER — Ambulatory Visit: Payer: Self-pay | Admitting: *Deleted

## 2023-08-17 NOTE — Patient Outreach (Signed)
  Care Coordination   Follow Up Visit Note   08/17/2023 Name: Mark Nash MRN: [id number] DOB: 18-Apr-1956  Mark Nash is a 67 y.o. year old male who sees Alba Cory, MD for primary care. I spoke with  Jeri Cos by phone today.  What matters to the patients health and wellness today?  Aware that his DM is in need of better management, will restart home monitoring (BP, HR, weight, and blood sugar).    Goals Addressed             This Visit's Progress    Management of chronic medical conditions       Interventions Today    Flowsheet Row Most Recent Value  Chronic Disease   Chronic disease during today's visit Chronic Obstructive Pulmonary Disease (COPD), Diabetes, Hypertension (HTN), Atrial Fibrillation (AFib), Congestive Heart Failure (CHF)  General Interventions   General Interventions Discussed/Reviewed General Interventions Reviewed, Doctor Visits, Labs  Labs Hgb A1c every 3 months  [10.5]  Doctor Visits Discussed/Reviewed Doctor Visits Reviewed, Annual Wellness Visits, Specialist, PCP  [upcoming with pharmacy team 1/8, podiatry 1/27, AWV 2/6, and PCP 3/24]  Durable Medical Equipment (DME) Glucomoter, BP Cuff, Other  [scale]  PCP/Specialist Visits Compliance with follow-up visit  Exercise Interventions   Exercise Discussed/Reviewed Weight Managment  Weight Management Weight maintenance  [Reminded to monitor weights daily]  Education Interventions   Education Provided Provided Education  Provided Verbal Education On Blood Sugar Monitoring, Medication, When to see the doctor, Nutrition  [Encouraged to restart monitoring blood sugar daily.]  Nutrition Interventions   Nutrition Discussed/Reviewed Nutrition Reviewed, Carbohydrate meal planning, Adding fruits and vegetables, Decreasing sugar intake  [State he has adjusted his diet, educated on importance of monitoring blood sugar for better day to day management]              SDOH assessments and  interventions completed:  No     Care Coordination Interventions:  Yes, provided   Follow up plan: Follow up call scheduled for 1/15    Encounter Outcome:  Patient Visit Completed   Rodney Langton, RN, MSN, CCM [place]  [facility], [facility] RN Care Coordinator Direct Dial: [phone] / Main [phone] Fax [phone] Email: [email or web address]: [email or web address]

## 2023-09-05 ENCOUNTER — Telehealth: Payer: Self-pay | Admitting: Pharmacist

## 2023-09-05 ENCOUNTER — Other Ambulatory Visit: Payer: Self-pay | Admitting: Pharmacist

## 2023-09-05 NOTE — Progress Notes (Signed)
   Outreach Note  09/05/2023 Name: JAFARI MCKILLOP MRN: [id number] DOB: Nov 11, 1955  Referred by: Sowles, Krichna, MD  Was unable to reach patient via telephone today and have left HIPAA compliant voicemail asking patient to return my call.   Follow Up Plan: Will collaborate with Care Guide to outreach to schedule follow up with me  Sharyle Sia, PharmD, [facility] [phone]

## 2023-09-12 ENCOUNTER — Ambulatory Visit: Payer: Self-pay | Admitting: *Deleted

## 2023-09-12 NOTE — Patient Instructions (Signed)
 Visit Information  Thank you for taking time to visit with me today. Please don't hesitate to contact me if I can be of assistance to you before our next scheduled telephone appointment.  Following are the goals we discussed today:  Check blood sugar and blood pressure daily Take medications at the same time daily Decrease pies and ice cream  Our next appointment is by telephone on 2/12  Please call the care guide team at [phone] if you need to cancel or reschedule your appointment.   Please call the Suicide and Crisis Lifeline: 988 call the [place]  National Suicide Prevention Lifeline: 240-502-3307 or TTY: 530-390-9491 TTY 8055507792) to talk to a trained counselor call 1-800-273-TALK (toll free, 24 hour hotline) call 911 if you are experiencing a Mental Health or Behavioral Health Crisis or need someone to talk to.  The patient verbalized understanding of instructions, educational materials, and care plan provided today and agreed to receive a mailed copy of patient instructions, educational materials, and care plan.   The patient has been provided with contact information for the care management team and has been advised to call with any health related questions or concerns.   Holland Lundborg, RN, MSN, CCM [facility], [facility] Health RN Care Coordinator Direct Dial: [phone] / Main [phone] Fax [phone] Email: [email or web address]: [email or web address]

## 2023-09-12 NOTE — Patient Outreach (Signed)
  Care Coordination   Follow Up Visit Note   09/12/2023 Name: Mark Nash MRN: [id number] DOB: 06/06/1956  Mark Nash is a 68 y.o. year old male who sees Sowles, Krichna, MD for primary care. I spoke with  Mark Nash by phone today.  What matters to the patients health and wellness today?  Patient report he is "ok" but does admit that he has not been self monitoring or maintaining his diet.  Verbalizes understanding of proper health maintenance, will adjust diet. He has been having a productive cough, white/yellow mucus, denies fever or shortness of breath. Was going to cancel podiatry appointment as he received notice that his insurance would no longer cover [facility], advised to call first as [name] may still be within his network. Denies any urgent concerns, encouraged to contact this care manager with questions.      Goals Addressed             This Visit's Progress    Management of chronic medical conditions   On track    Interventions Today    Flowsheet Row Most Recent Value  Chronic Disease   Chronic disease during today's visit Hypertension (HTN), Diabetes  General Interventions   General Interventions Discussed/Reviewed General Interventions Reviewed, Annual Foot Exam, Doctor Visits, Labs, Durable Medical Equipment (DME), Communication with  Labs Hgb A1c every 3 months  [Will have new A1C with next PCP visit]  Doctor Visits Discussed/Reviewed Doctor Visits Reviewed, Annual Wellness Visits, PCP, Specialist  [Reviewed upcoming: podiatry 1/27, AWV 2/6, PCP 3/24]  Durable Medical Equipment (DME) Glucomoter, BP Cuff  [Has glucose meter, does not have test strips]  PCP/Specialist Visits Compliance with follow-up visit  Communication with PCP/Specialists  [Message to PCP to order test strips for glucose meter]  Education Interventions   Education Provided Provided Education, Provided Printed Education  Provided Verbal Education On Foot Care, Nutrition, Blood Sugar  Monitoring, Medication, When to see the doctor, Insurance Plans  [Advised to need to continue monitoring BP and glucose daily, BP elevated today at 160/95 prior to meds. reminded to take meds as ordered. Advised of importance of foot care, advised to keep podiatry appt]  Nutrition Interventions   Nutrition Discussed/Reviewed Nutrition Reviewed, Decreasing sugar intake, Adding fruits and vegetables  [State he started eating lots of pies and ice cream, advised against this and to eat more fruits and veggies]              SDOH assessments and interventions completed:  No     Care Coordination Interventions:  Yes, provided   Follow up plan: Follow up call scheduled for 2/12    Encounter Outcome:  Patient Visit Completed   Holland Lundborg, RN, MSN, CCM [place]  [facility], [facility] Health RN Care Coordinator Direct Dial: [phone] / Main [phone] Fax [phone] Email: [email or web address] Website: [email or web address]

## 2023-09-14 ENCOUNTER — Other Ambulatory Visit: Payer: Self-pay | Admitting: Family Medicine

## 2023-09-24 ENCOUNTER — Other Ambulatory Visit: Payer: Self-pay | Admitting: Nurse Practitioner

## 2023-09-24 DIAGNOSIS — M79674 Pain in right toe(s): Secondary | ICD-10-CM | POA: Diagnosis not present

## 2023-09-24 DIAGNOSIS — I872 Venous insufficiency (chronic) (peripheral): Secondary | ICD-10-CM | POA: Diagnosis not present

## 2023-09-24 DIAGNOSIS — L03115 Cellulitis of right lower limb: Secondary | ICD-10-CM | POA: Diagnosis not present

## 2023-09-24 DIAGNOSIS — L851 Acquired keratosis [keratoderma] palmaris et plantaris: Secondary | ICD-10-CM | POA: Diagnosis not present

## 2023-09-24 DIAGNOSIS — B351 Tinea unguium: Secondary | ICD-10-CM | POA: Diagnosis not present

## 2023-09-24 DIAGNOSIS — E1142 Type 2 diabetes mellitus with diabetic polyneuropathy: Secondary | ICD-10-CM | POA: Diagnosis not present

## 2023-09-24 DIAGNOSIS — M79675 Pain in left toe(s): Secondary | ICD-10-CM | POA: Diagnosis not present

## 2023-09-24 DIAGNOSIS — I739 Peripheral vascular disease, unspecified: Secondary | ICD-10-CM | POA: Diagnosis not present

## 2023-09-24 DIAGNOSIS — L84 Corns and callosities: Secondary | ICD-10-CM | POA: Diagnosis not present

## 2023-09-26 ENCOUNTER — Other Ambulatory Visit: Payer: Self-pay | Admitting: Nurse Practitioner

## 2023-09-26 NOTE — Telephone Encounter (Unsigned)
Copied from CRM [id number]. Topic: General - Other >> Sep 26, 2023  3:09 PM [name] wrote: Reason for CRM: Pt called reporting that his insurance will not cover levemir so he either needs an alternative option or an appeal with the insurance to have them cover this. It may just need a prior authorization, pt is unsure of what he was told to communicate.

## 2023-09-26 NOTE — Telephone Encounter (Signed)
Requested Prescriptions  Pending Prescriptions Disp Refills   LEVEMIR 100 UNIT/ML injection [Pharmacy Med Name: Levemir 100 UNIT/ML Subcutaneous Solution] 10 mL 0    Sig: INJECT 8 UNITS SUBCUTANEOUSLY AT BEDTIME. DISCARD UNUSED PORTION 42 DAYS AFTER FIRST USE     Endocrinology:  Diabetes - Insulins Failed - 09/26/2023  2:08 PM      Failed - HBA1C is between 0 and 7.9 and within 180 days    Hemoglobin A1C  Date Value Ref Range Status  07/20/2023 10.5 (A) 4.0 - 5.6 % Final   HbA1c, POC (controlled diabetic range)  Date Value Ref Range Status  07/11/2019 6.6 0.0 - 7.0 % Final   Hgb A1c MFr Bld  Date Value Ref Range Status  11/11/2021 8.8 (H) <5.7 % of total Hgb Final    Comment:    For someone without known diabetes, a hemoglobin A1c value of 6.5% or greater indicates that they may have  diabetes and this should be confirmed with a follow-up  test. . For someone with known diabetes, a value <7% indicates  that their diabetes is well controlled and a value  greater than or equal to 7% indicates suboptimal  control. A1c targets should be individualized based on  duration of diabetes, age, comorbid conditions, and  other considerations. . Currently, no consensus exists regarding use of hemoglobin A1c for diagnosis of diabetes for children. Verna Czech - Valid encounter within last 6 months    Recent Outpatient Visits           2 months ago Type 2 diabetes mellitus with diabetic polyneuropathy, with long-term current use of insulin [facility])   [place] [facility] Alba Cory, MD   3 months ago Rhinosinusitis   [facility] Danelle Berry, PA-C   6 months ago Type 2 diabetes mellitus with diabetic polyneuropathy, with long-term current use of insulin [facility])   [place] [facility] Alba Cory, MD   7 months ago Leg edema   [facility] Alba Cory, MD   7 months ago Hospital  discharge follow-up   [facility] Alba Cory, MD       Future Appointments             In 1 month Alba Cory, MD [facility], [facility]   In 2 months Dunn, Raymon Mutton, PA-C [facility]

## 2023-09-27 ENCOUNTER — Other Ambulatory Visit (INDEPENDENT_AMBULATORY_CARE_PROVIDER_SITE_OTHER): Payer: Self-pay | Admitting: Podiatry

## 2023-09-27 DIAGNOSIS — I739 Peripheral vascular disease, unspecified: Secondary | ICD-10-CM

## 2023-09-27 NOTE — Telephone Encounter (Signed)
I believe its been d/c world wide?

## 2023-09-28 ENCOUNTER — Ambulatory Visit (INDEPENDENT_AMBULATORY_CARE_PROVIDER_SITE_OTHER): Payer: PPO

## 2023-09-28 DIAGNOSIS — I739 Peripheral vascular disease, unspecified: Secondary | ICD-10-CM | POA: Diagnosis not present

## 2023-09-28 NOTE — Telephone Encounter (Signed)
Pt has been APPROVED FOR 2025

## 2023-10-01 ENCOUNTER — Telehealth: Payer: Self-pay | Admitting: Family Medicine

## 2023-10-01 ENCOUNTER — Other Ambulatory Visit: Payer: Self-pay | Admitting: Family Medicine

## 2023-10-01 DIAGNOSIS — M961 Postlaminectomy syndrome, not elsewhere classified: Secondary | ICD-10-CM | POA: Diagnosis not present

## 2023-10-01 DIAGNOSIS — G894 Chronic pain syndrome: Secondary | ICD-10-CM | POA: Diagnosis not present

## 2023-10-01 DIAGNOSIS — Z716 Tobacco abuse counseling: Secondary | ICD-10-CM | POA: Diagnosis not present

## 2023-10-01 DIAGNOSIS — M545 Low back pain, unspecified: Secondary | ICD-10-CM | POA: Diagnosis not present

## 2023-10-01 DIAGNOSIS — Z794 Long term (current) use of insulin: Secondary | ICD-10-CM

## 2023-10-01 DIAGNOSIS — F209 Schizophrenia, unspecified: Secondary | ICD-10-CM | POA: Diagnosis not present

## 2023-10-01 DIAGNOSIS — Z720 Tobacco use: Secondary | ICD-10-CM | POA: Diagnosis not present

## 2023-10-01 DIAGNOSIS — Z79891 Long term (current) use of opiate analgesic: Secondary | ICD-10-CM | POA: Diagnosis not present

## 2023-10-01 DIAGNOSIS — I10 Essential (primary) hypertension: Secondary | ICD-10-CM | POA: Diagnosis not present

## 2023-10-01 DIAGNOSIS — F1120 Opioid dependence, uncomplicated: Secondary | ICD-10-CM | POA: Diagnosis not present

## 2023-10-01 DIAGNOSIS — M5416 Radiculopathy, lumbar region: Secondary | ICD-10-CM | POA: Diagnosis not present

## 2023-10-01 DIAGNOSIS — E119 Type 2 diabetes mellitus without complications: Secondary | ICD-10-CM | POA: Diagnosis not present

## 2023-10-01 DIAGNOSIS — M5430 Sciatica, unspecified side: Secondary | ICD-10-CM | POA: Diagnosis not present

## 2023-10-01 MED ORDER — INSULIN DEGLUDEC 100 UNIT/ML ~~LOC~~ SOLN
8.0000 [IU] | Freq: Every day | SUBCUTANEOUS | 1 refills | Status: DC
Start: 2023-10-01 — End: 2024-03-25

## 2023-10-01 NOTE — Telephone Encounter (Signed)
Copied from CRM [id number]. Topic: General - Other >> Oct 01, 2023  2:21 PM Everette C wrote: Reason for CRM: The patient has called to request prior authorization for their LEVEMIR 100 UNIT/ML injection [[id number]] prescription   Please contact the patient further when possible

## 2023-10-01 NOTE — Telephone Encounter (Signed)
No answer from pt left detailed vm. °

## 2023-10-01 NOTE — Telephone Encounter (Signed)
No longer available as we discussed maybe tresiba?

## 2023-10-03 ENCOUNTER — Other Ambulatory Visit: Payer: Self-pay | Admitting: Family Medicine

## 2023-10-03 DIAGNOSIS — E559 Vitamin D deficiency, unspecified: Secondary | ICD-10-CM

## 2023-10-04 ENCOUNTER — Ambulatory Visit: Payer: PPO

## 2023-10-04 DIAGNOSIS — Z0000 Encounter for general adult medical examination without abnormal findings: Secondary | ICD-10-CM | POA: Diagnosis not present

## 2023-10-04 NOTE — Telephone Encounter (Signed)
 Requested medications are due for refill today.  yes  Requested medications are on the active medications list.  yes  Last refill. 07/20/2023 #6 1 rf  Future visit scheduled.   yes  Notes to clinic.  Provider to review at this dosage.    Requested Prescriptions  Pending Prescriptions Disp Refills   Vitamin D , Ergocalciferol , (DRISDOL ) 1.25 MG (50000 UNIT) CAPS capsule [Pharmacy Med Name: Vitamin D  (Ergocalciferol ) 1.25 MG (50000 UT) Oral Capsule] 6 capsule 0    Sig: TAKE 1 CAPSULE BY MOUTH EVERY 14 DAYS     Endocrinology:  Vitamins - Vitamin D  Supplementation 2 Failed - 10/04/2023  2:22 PM      Failed - Manual Review: Route requests for 50,000 IU strength to the provider      Passed - Ca in normal range and within 360 days    Calcium  Date Value Ref Range Status  07/04/2023 9.5 8.6 - 10.2 mg/dL Final   Calcium, Ion  Date Value Ref Range Status  01/29/2023 1.23 1.15 - 1.40 mmol/L Final         Passed - Vitamin D  in normal range and within 360 days    Vit D, 25-Hydroxy  Date Value Ref Range Status  11/15/2022 66 30 - 100 ng/mL Final    Comment:    Vitamin D  Status         25-OH Vitamin D : . Deficiency:                    <20 ng/mL Insufficiency:             20 - 29 ng/mL Optimal:                 > or = 30 ng/mL . For 25-OH Vitamin D  testing on patients on  D2-supplementation and patients for whom quantitation  of D2 and D3 fractions is required, the QuestAssureD(TM) 25-OH VIT D, (D2,D3), LC/MS/MS is recommended: order  code 07111 (patients >45yrs). . See Note 1 . Note 1 . For additional information, please refer to  http://education.Quest[employer]ostics.com/faq/FAQ199  (This link is being provided for informational/ educational purposes only.)          Passed - Valid encounter within last 12 months    Recent Outpatient Visits           2 months ago Type 2 diabetes mellitus with diabetic polyneuropathy, with long-term current use of insulin  [facility])   [place]  [facility] Glenard Mire, MD   3 months ago Rhinosinusitis   [facility] Leavy Mole, PA-C   6 months ago Type 2 diabetes mellitus with diabetic polyneuropathy, with long-term current use of insulin  [facility])   [facility] Glenard Mire, MD   7 months ago Leg edema   [facility] Glenard Mire, MD   7 months ago Hospital discharge follow-up   [facility] Sowles, Krichna, MD       Future Appointments             In 1 month Glenard, Krichna, MD [facility], [facility]   In 2 months Dunn, Bernardino HERO, PA-C [facility]

## 2023-10-04 NOTE — Patient Instructions (Addendum)
 [name] , Thank you for taking time to come for your Medicare Wellness Visit. I appreciate your ongoing commitment to your health goals. Please review the following plan we discussed and let me know if I can assist you in the future.   Referrals/Orders/Follow-Ups/Clinician Recommendations: NONE  This is a list of the screening recommended for you and due dates:  Health Maintenance  Topic Date Due   Screening for Lung Cancer  Never done   Complete foot exam   04/20/2023   COVID-19 Vaccine (4 - 2024-25 season) 04/29/2023   Colon Cancer Screening  07/19/2024*   Yearly kidney health urinalysis for diabetes  11/15/2023   Eye exam for diabetics  11/30/2023   Hemoglobin A1C  01/17/2024   Yearly kidney function blood test for diabetes  07/03/2024   Medicare Annual Wellness Visit  10/03/2024   DTaP/Tdap/Td vaccine (3 - Td or Tdap) 03/29/2030   Pneumonia Vaccine  Completed   Flu Shot  Completed   Hepatitis C Screening  Completed   Zoster (Shingles) Vaccine  Completed   HPV Vaccine  Aged Out  *Topic was postponed. The date shown is not the original due date.    Advanced directives: (ACP Link)Information on Advanced Care Planning can be found at [place]  Secretary of [facility] Advance Health Care Directives Advance Health Care Directives ([email or web address]   Next Medicare Annual Wellness Visit scheduled for next year: Yes    10/09/24 @ 11:30 AM BY PHONE

## 2023-10-04 NOTE — Progress Notes (Signed)
 Subjective:   Mark Nash is a 68 y.o. male who presents for Medicare Annual/Subsequent preventive examination.  Visit Complete: Virtual I connected with  Mark Nash on 10/04/23 by a audio enabled telemedicine application and verified that I am speaking with the correct person using two identifiers.  This patient declined Interactive audio and acupuncturist. Therefore the visit was completed with audio only.   Patient Location: Home  Provider Location: Office/Clinic  I discussed the limitations of evaluation and management by telemedicine. The patient expressed understanding and agreed to proceed.  Vital Signs: Because this visit was a virtual/telehealth visit, some criteria may be missing or patient reported. Any vitals not documented were not able to be obtained and vitals that have been documented are patient reported.  Cardiac Risk Factors include: advanced age (>12men, >66 women);dyslipidemia;male gender;hypertension;diabetes mellitus;sedentary lifestyle     Objective:    There were no vitals filed for this visit. There is no height or weight on file to calculate BMI.     10/04/2023    1:58 PM 02/01/2023    4:41 AM 01/29/2023    7:20 AM 01/21/2023    5:18 PM 09/29/2022    1:12 PM 06/09/2021    8:31 AM 05/20/2021    6:48 AM  Advanced Directives  Does Patient Have a Medical Advance Directive? No No No No No No No  Would patient like information on creating a medical advance directive? No - Patient declined   No - Patient declined Yes (ED - Information included in AVS) No - Patient declined No - Patient declined    Current Medications (verified) Outpatient Encounter Medications as of 10/04/2023  Medication Sig   apixaban  (ELIQUIS ) 5 MG TABS tablet Take 1 tablet (5 mg total) by mouth 2 (two) times daily.   blood glucose meter kit and supplies KIT Dispense based on patient and insurance preference. Use up to tid E11.65, LON:99   Budeson-Glycopyrrol-Formoterol   (BREZTRI  AEROSPHERE) 160-9-4.8 MCG/ACT AERO Inhale 2 puffs into the lungs 2 (two) times daily. Patient receives via [place] Patient Assistance.   dapagliflozin  propanediol (FARXIGA ) 10 MG TABS tablet Take 1 tablet (10 mg total) by mouth daily.   digoxin  (LANOXIN ) 0.125 MG tablet Take 0.5 tablets (0.0625 mg total) by mouth daily.   gabapentin  (NEURONTIN ) 300 MG capsule Take 300-600 mg by mouth See admin instructions. Take 1 capsule (300 mg) by mouth in the morning, take 1 capsule (300 mg) by mouth at noon, and 2 capsules (600 mg) by mouth in the evening   Insulin  Degludec (TRESIBA ) 100 UNIT/ML SOLN Inject 8 Units into the skin daily at 12 noon.   Lancet Devices MISC 1 each by Does not apply route 3 (three) times daily as needed. What ins will cover dx:E11.65, LON:99 tid   metoprolol  succinate (TOPROL -XL) 200 MG 24 hr tablet Take 1 tablet (200 mg total) by mouth daily. Take with or immediately following a meal.   pravastatin  (PRAVACHOL ) 20 MG tablet Take 1 tablet (20 mg total) by mouth daily with supper.   tiZANidine  (ZANAFLEX ) 4 MG tablet Take 4 mg by mouth every 12 (twelve) hours.   traMADol  (ULTRAM ) 50 MG tablet Take 50 mg by mouth every 12 (twelve) hours.   triamcinolone  cream (KENALOG ) 0.1 % Apply topically 2 (two) times daily.   Vitamin D , Ergocalciferol , (DRISDOL ) 1.25 MG (50000 UNIT) CAPS capsule Take 1 capsule (50,000 Units total) by mouth every 7 (seven) days.   cetirizine  (ZYRTEC ) 10 MG tablet Take 1 tablet (10  mg total) by mouth at bedtime as needed for allergies or rhinitis. (Patient not taking: Reported on 10/04/2023)   losartan  (COZAAR ) 25 MG tablet Take 0.5 tablets (12.5 mg total) by mouth daily. Skip the dose if systolic BP less than 110 mmHg   potassium chloride  SA (KLOR-CON  M) 20 MEQ tablet Take 1 tablet (20 mEq total) by mouth daily. (Patient not taking: Reported on 10/04/2023)   torsemide  (DEMADEX ) 20 MG tablet Take 1 tablet (20 mg total) by mouth daily before breakfast. With potassium  pill and hold losartan  when you take this medication (Patient not taking: Reported on 10/04/2023)   No facility-administered encounter medications on file as of 10/04/2023.    Allergies (verified) Penicillins, Aspirin , Ibuprofen, Lyrica  [pregabalin ], and Acetaminophen    History: Past Medical History:  Diagnosis Date   Arthritis    Asthma    Benign neoplasm of ascending colon    polyps   Constipation    COPD (chronic obstructive pulmonary disease) (HCC)    NO INHALER   Diabetes mellitus without complication (HCC)    Type 2   Diverticulosis    Dyspnea    RARE-WITH EXERTION DUE TO COPD   GERD (gastroesophageal reflux disease)    Hepatitis C    History of kidney stones    H/O   Hyperlipidemia    Hypertension 06/10/2018   currently not under control   Left inguinal hernia    Peripheral vascular disease (HCC)    Restless legs    Schizoaffective disorder (HCC)    Pt denies   Thrombocytopenia (HCC)    Vitamin D  deficiency    Wears dentures    full upper and lower   Past Surgical History:  Procedure Laterality Date   BACK SURGERY     CARDIOVERSION N/A 05/20/2021   Procedure: CARDIOVERSION;  Surgeon: Darron Deatrice LABOR, MD;  Location: [facility] OR[facility];  Service: Cardiovascular;  Laterality: N/A;   CATARACT EXTRACTION W/PHACO Right 10/31/2021   Procedure: CATARACT EXTRACTION PHACO AND INTRAOCULAR LENS PLACEMENT (IOC) RIGHT DIABETIC;  Surgeon: Myrna Adine Anes, MD;  Location: [facility];  Service: Ophthalmology;  Laterality: Right;  Diabetic 4.34 00:42.8   COLONOSCOPY WITH PROPOFOL  N/A 09/07/2017   Procedure: COLONOSCOPY WITH PROPOFOL ;  Surgeon: Janalyn Keene NOVAK, MD;  Location: [facility];  Service: Endoscopy;  Laterality: N/A;   ESOPHAGOGASTRODUODENOSCOPY (EGD) WITH PROPOFOL  N/A 12/07/2017   Procedure: ESOPHAGOGASTRODUODENOSCOPY (EGD) WITH PROPOFOL ;  Surgeon: Janalyn Keene NOVAK, MD;  Location: [facility];  Service: Endoscopy;  Laterality: N/A;   EYE SURGERY      FLEXIBLE SIGMOIDOSCOPY N/A 11/09/2017   Procedure: FLEXIBLE SIGMOIDOSCOPY;  Surgeon: Janalyn Keene NOVAK, MD;  Location: [facility];  Service: Endoscopy;  Laterality: N/A;   HERNIA REPAIR     inguinal   INGUINAL HERNIA REPAIR Bilateral 06/21/2018   Procedure: LAPAROSCOPIC BILATERAL INGUINAL HERNIA REPAIR;  Surgeon: Nicholaus Selinda Birmingham, MD;  Location: [facility];  Service: General;  Laterality: Bilateral;   L3 TO L5 LAMINECTOMY FOR DECOMPRESSION  07/17/2016   [facility]   REPAIR OF CEREBROSPINAL FLUID LEAK N/A 09/08/2016   Procedure: Lumbar wound exploration, repair of pseudomenigocele;  Surgeon: Morene Hicks Ditty, MD;  Location: [facility];  Service: Neurosurgery;  Laterality: N/A;  Lumbar wound exploration, repair of pseudomenigocele   RIGHT/LEFT HEART CATH AND CORONARY ANGIOGRAPHY N/A 01/29/2023   Procedure: RIGHT/LEFT HEART CATH AND CORONARY ANGIOGRAPHY;  Surgeon: Darron Deatrice LABOR, MD;  Location: [facility];  Service: Cardiovascular;  Laterality: N/A;   SINUSOTOMY  Family History  Problem Relation Age of Onset   Heart failure Mother    Hypertension Mother    Diverticulitis Mother    Colitis Mother    Diabetes Father    Hypertension Father    Heart disease Father    Diabetes Sister    Heart failure Brother    Diverticulitis Brother    Social History   Socioeconomic History   Marital status: Divorced    Spouse name: Not on file   Number of children: 2   Years of education: Not on file   Highest education level: 12th grade  Occupational History   Occupation: Disabled  Tobacco Use   Smoking status: Every Day    Current packs/day: 1.50    Average packs/day: 1.5 packs/day for 46.0 years (69.0 ttl pk-yrs)    Types: Cigarettes   Smokeless tobacco: Never  Vaping Use   Vaping status: Never Used  Substance and Sexual Activity   Alcohol use: No    Alcohol/week: 0.0 standard drinks of alcohol   Drug use: No   Sexual activity: Not Currently   Other Topics Concern   Not on file  Social History Narrative   Lives alone   Social Drivers of Health   Financial Resource Strain: Low Risk  (10/04/2023)   Overall Financial Resource Strain (CARDIA)    Difficulty of Paying Living Expenses: Not hard at all  Food Insecurity: No Food Insecurity (10/04/2023)   Hunger Vital Sign    Worried About Running Out of Food in the Last Year: Never true    Ran Out of Food in the Last Year: Never true  Transportation Needs: No Transportation Needs (10/04/2023)   PRAPARE - Administrator, Civil Service (Medical): No    Lack of Transportation (Non-Medical): No  Physical Activity: Insufficiently Active (10/04/2023)   Exercise Vital Sign    Days of Exercise per Week: 1 day    Minutes of Exercise per Session: 10 min  Stress: No Stress Concern Present (10/04/2023)   [employer] of Occupational Health - Occupational Stress Questionnaire    Feeling of Stress : Not at all  Social Connections: Socially Isolated (10/04/2023)   Social Connection and Isolation Panel [NHANES]    Frequency of Communication with Friends and Family: Once a week    Frequency of Social Gatherings with Friends and Family: Never    Attends Religious Services: Never    Database Administrator or Organizations: No    Attends Engineer, Structural: Never    Marital Status: Divorced    Tobacco Counseling Ready to quit: Not Answered Counseling given: Not Answered   Clinical Intake:  Pre-visit preparation completed: Yes  Pain : No/denies pain     BMI - recorded: 21.1 Nutritional Status: BMI of 19-24  Normal Nutritional Risks: None Diabetes: Yes CBG done?: No Did pt. bring in CBG monitor from home?: No  How often do you need to have someone help you when you read instructions, pamphlets, or other written materials from your doctor or pharmacy?: 1 - Never  Interpreter Needed?: No  Information entered by :: JHONNIE DAS, LPN   Activities of Daily  Living    10/04/2023    1:59 PM 07/20/2023    2:09 PM  In your present state of health, do you have any difficulty performing the following activities:  Hearing? 0 0  Vision? 0 0  Difficulty concentrating or making decisions? 0 0  Walking or climbing stairs? 1 0  Dressing  or bathing? 0 0  Doing errands, shopping? 0 0  Preparing Food and eating ? N   Using the Toilet? N   In the past six months, have you accidently leaked urine? N   Do you have problems with loss of bowel control? N   Managing your Medications? N   Managing your Finances? N   Housekeeping or managing your Housekeeping? N     Patient Care Team: Sowles, Krichna, MD as PCP - General (Family Medicine) Cindie Ole DASEN, MD as PCP - Electrophysiology (Cardiology) Perla Evalene PARAS, MD as PCP - Cardiology (Cardiology) Janalyn Keene NOVAK, MD (Inactive) as Consulting Physician (Gastroenterology) Tobie Priest, MD as Consulting Physician (Orthopedic Surgery) Lennie Barter, DPM as Consulting Physician (Podiatry) Fernand Grumbles, MD as Referring Physician (Pain Medicine) End, Lonni, MD as Consulting Physician (Cardiology) Rosina Hough, RN as [facility] Care Management Delles, Sharyle LABOR, RPH-CPP as Pharmacist Myrna, Adine Anes, MD as Consulting Physician (Ophthalmology)  Indicate any recent Medical Services you may have received from other than [facility] providers in the past year (date may be approximate).     Assessment:   This is a routine wellness examination for [place].  Hearing/Vision screen Hearing Screening - Comments:: NO AIDS Vision Screening - Comments:: NO GLASSES- DR.KING   Goals Addressed             This Visit's Progress    DIET - EAT MORE FRUITS AND VEGETABLES         Depression Screen    10/04/2023    1:57 PM 07/20/2023    2:08 PM 06/21/2023   10:45 AM 03/21/2023    1:33 PM 02/21/2023    3:31 PM 02/07/2023    2:38 PM 12/27/2022    3:18 PM  PHQ 2/9 Scores  PHQ - 2  Score 0 0 0 0 0 0 0  PHQ- 9 Score 0 0 0 0       Fall Risk    10/04/2023    1:59 PM 07/20/2023    2:08 PM 06/21/2023   10:45 AM 03/21/2023    1:33 PM 02/21/2023    3:31 PM  Fall Risk   Falls in the past year? 0 0 0 0 0  Number falls in past yr: 0  0  0  Injury with Fall? 0  0  0  Risk for fall due to : No Fall Risks No Fall Risks  No Fall Risks   Follow up Falls prevention discussed;Falls evaluation completed Falls prevention discussed  Falls prevention discussed Falls prevention discussed    MEDICARE RISK AT HOME: Medicare Risk at Home Any stairs in or around the home?: Yes If so, are there any without handrails?: No Home free of loose throw rugs in walkways, pet beds, electrical cords, etc?: Yes Adequate lighting in your home to reduce risk of falls?: Yes Life alert?: No Use of a cane, walker or w/c?: No Grab bars in the bathroom?: No Shower chair or bench in shower?: Yes Elevated toilet seat or a handicapped toilet?: No  TIMED UP AND GO:  Was the test performed?  No    Cognitive Function:        10/04/2023    2:00 PM 09/29/2022    1:16 PM 11/30/2017    3:14 PM  6CIT Screen  What Year? 0 points 0 points 0 points  What month? 0 points 0 points 0 points  What time? 3 points 0 points 0 points  Count back from 20 0 points  0 points 0 points  Months in reverse 0 points 0 points 0 points  Repeat phrase 0 points 0 points 0 points  Total Score 3 points 0 points 0 points    Immunizations Immunization History  Administered Date(s) Administered   Fluad Quad(high Dose 65+) 04/28/2021, 06/06/2022   Fluad Trivalent(High Dose 65+) 07/20/2023   Influenza Inj Mdck Quad Pf 07/11/2019   Influenza, High Dose Seasonal PF 06/30/2016   Influenza,inj,Quad PF,6+ Mos 07/17/2017, 05/29/2018   Influenza-Unspecified 06/21/2020   PFIZER(Purple Top)SARS-COV-2 Vaccination 11/13/2019, 12/09/2019, 08/04/2020   PNEUMOCOCCAL CONJUGATE-20 09/19/2021   Pneumococcal Conjugate-13 07/13/2015    Pneumococcal Polysaccharide-23 12/23/2009, 03/29/2020   Tdap 12/23/2009, 03/29/2020   Zoster Recombinant(Shingrix ) 03/16/2021, 07/23/2021    TDAP status: Up to date  Flu Vaccine status: Up to date  Pneumococcal vaccine status: Up to date  Covid-19 vaccine status: Completed vaccines  Qualifies for Shingles Vaccine? Yes   Zostavax completed No   Shingrix  Completed?: Yes  Screening Tests Health Maintenance  Topic Date Due   Lung Cancer Screening  Never done   FOOT EXAM  04/20/2023   COVID-19 Vaccine (4 - 2024-25 season) 04/29/2023   Colonoscopy  07/19/2024 (Originally 09/07/2018)   Diabetic kidney evaluation - Urine ACR  11/15/2023   OPHTHALMOLOGY EXAM  11/30/2023   HEMOGLOBIN A1C  01/17/2024   Diabetic kidney evaluation - eGFR measurement  07/03/2024   Medicare Annual Wellness (AWV)  10/03/2024   DTaP/Tdap/Td (3 - Td or Tdap) 03/29/2030   Pneumonia Vaccine 32+ Years old  Completed   INFLUENZA VACCINE  Completed   Hepatitis C Screening  Completed   Zoster Vaccines- Shingrix   Completed   HPV VACCINES  Aged Out    Health Maintenance  Health Maintenance Due  Topic Date Due   Lung Cancer Screening  Never done   FOOT EXAM  04/20/2023   COVID-19 Vaccine (4 - 2024-25 season) 04/29/2023    DECLINED REFERRAL FOR COLONOSCOPY  Lung Cancer Screening: (Low Dose CT Chest recommended if Age 30-80 years, 20 pack-year currently smoking OR have quit w/in 15years.) does qualify.   Lung Cancer Screening Referral: DECLINED CT REFERRAL  Additional Screening:  Hepatitis C Screening: does qualify; Completed 07/20/23  Vision Screening: Recommended annual ophthalmology exams for early detection of glaucoma and other disorders of the eye. Is the patient up to date with their annual eye exam?  No  Who is the provider or what is the name of the office in which the patient attends annual eye exams? NO ONE If pt is not established with a provider, would they like to be referred to a provider  to establish care? No .   Dental Screening: Recommended annual dental exams for proper oral hygiene  Diabetic Foot Exam: Diabetic Foot Exam: Completed 04/19/22  Community Resource Referral / Chronic Care Management: CRR required this visit?  No   CCM required this visit?  No     Plan:     I have personally reviewed and noted the following in the patient's chart:   Medical and social history Use of alcohol, tobacco or illicit drugs  Current medications and supplements including opioid prescriptions. Patient is not currently taking opioid prescriptions. Functional ability and status Nutritional status Physical activity Advanced directives List of other physicians Hospitalizations, surgeries, and ER visits in previous 12 months Vitals Screenings to include cognitive, depression, and falls Referrals and appointments  In addition, I have reviewed and discussed with patient certain preventive protocols, quality metrics, and best practice recommendations. A written personalized  care plan for preventive services as well as general preventive health recommendations were provided to patient.     Jhonnie GORMAN Das, LPN   02/27/[id number]   After Visit Summary: (MyChart) Due to this being a telephonic visit, the after visit summary with patients personalized plan was offered to patient via MyChart   Nurse Notes: NONE

## 2023-10-05 LAB — VAS US ABI WITH/WO TBI
Left ABI: 0.99
Right ABI: 0.79

## 2023-10-08 DIAGNOSIS — I872 Venous insufficiency (chronic) (peripheral): Secondary | ICD-10-CM | POA: Diagnosis not present

## 2023-10-08 DIAGNOSIS — L03115 Cellulitis of right lower limb: Secondary | ICD-10-CM | POA: Diagnosis not present

## 2023-10-08 DIAGNOSIS — I739 Peripheral vascular disease, unspecified: Secondary | ICD-10-CM | POA: Diagnosis not present

## 2023-10-08 LAB — HM DIABETES FOOT EXAM: HM Diabetic Foot Exam: NORMAL

## 2023-10-09 ENCOUNTER — Ambulatory Visit (INDEPENDENT_AMBULATORY_CARE_PROVIDER_SITE_OTHER): Payer: PPO | Admitting: Vascular Surgery

## 2023-10-09 ENCOUNTER — Encounter (INDEPENDENT_AMBULATORY_CARE_PROVIDER_SITE_OTHER): Payer: Self-pay | Admitting: Vascular Surgery

## 2023-10-09 ENCOUNTER — Telehealth (INDEPENDENT_AMBULATORY_CARE_PROVIDER_SITE_OTHER): Payer: Self-pay

## 2023-10-09 VITALS — BP 133/82 | HR 66 | Resp 16 | Ht 71.0 in | Wt 157.2 lb

## 2023-10-09 DIAGNOSIS — Z794 Long term (current) use of insulin: Secondary | ICD-10-CM | POA: Diagnosis not present

## 2023-10-09 DIAGNOSIS — E785 Hyperlipidemia, unspecified: Secondary | ICD-10-CM | POA: Diagnosis not present

## 2023-10-09 DIAGNOSIS — I10 Essential (primary) hypertension: Secondary | ICD-10-CM | POA: Diagnosis not present

## 2023-10-09 DIAGNOSIS — E1142 Type 2 diabetes mellitus with diabetic polyneuropathy: Secondary | ICD-10-CM

## 2023-10-09 DIAGNOSIS — I70211 Atherosclerosis of native arteries of extremities with intermittent claudication, right leg: Secondary | ICD-10-CM

## 2023-10-09 DIAGNOSIS — I70219 Atherosclerosis of native arteries of extremities with intermittent claudication, unspecified extremity: Secondary | ICD-10-CM | POA: Insufficient documentation

## 2023-10-09 NOTE — Assessment & Plan Note (Signed)
blood glucose control important in reducing the progression of atherosclerotic disease. Also, involved in wound healing. On appropriate medications. Last Hgb A1c was over 10.

## 2023-10-09 NOTE — H&P (View-Only) (Signed)
 Patient ID: Mark Nash, male   DOB: [birth year], 68 y.o.   MRN: [id number]  Chief Complaint  Patient presents with   New Patient (Initial Visit)    Ref Excell Seltzer consult with ABI    HPI Mark Nash is a 68 y.o. male.  I am asked to see the patient by Dr. Carlynn Purl for evaluation of PAD.  We had checked him about a year and a half ago and his perfusion was found to be intact at that time.  He continues to smoke heavily.  Over the past couple of months, he has began developing more pain in his right leg with short distances of walking.  He has known neurogenic issues and he related it largely to that problem.  No real left leg symptoms.  His primary care physician astutely reordered noninvasive arterial studies and these were performed in our office about 2 weeks ago.  His right ABI had dropped down to 0.79.  His left ABI remains normal at 1.01.     Past Medical History:  Diagnosis Date   Arthritis    Asthma    Benign neoplasm of ascending colon    polyps   Constipation    COPD (chronic obstructive pulmonary disease) (HCC)    NO INHALER   Diabetes mellitus without complication (HCC)    Type 2   Diverticulosis    Dyspnea    RARE-WITH EXERTION DUE TO COPD   GERD (gastroesophageal reflux disease)    Hepatitis C    History of kidney stones    H/O   Hyperlipidemia    Hypertension 06/10/2018   currently not under control   Left inguinal hernia    Peripheral vascular disease (HCC)    Restless legs    Schizoaffective disorder (HCC)    Pt denies   Thrombocytopenia (HCC)    Vitamin D deficiency    Wears dentures    full upper and lower    Past Surgical History:  Procedure Laterality Date   BACK SURGERY     CARDIOVERSION N/A 05/20/2021   Procedure: CARDIOVERSION;  Surgeon: Iran Ouch, MD;  Location: [facility];  Service: Cardiovascular;  Laterality: N/A;   CATARACT EXTRACTION W/PHACO Right 10/31/2021   Procedure: CATARACT EXTRACTION PHACO AND INTRAOCULAR LENS PLACEMENT  (IOC) RIGHT DIABETIC;  Surgeon: Nevada Crane, MD;  Location: [facility];  Service: Ophthalmology;  Laterality: Right;  Diabetic 4.34 00:42.8   COLONOSCOPY WITH PROPOFOL N/A 09/07/2017   Procedure: COLONOSCOPY WITH PROPOFOL;  Surgeon: Pasty Spillers, MD;  Location: [facility];  Service: Endoscopy;  Laterality: N/A;   ESOPHAGOGASTRODUODENOSCOPY (EGD) WITH PROPOFOL N/A 12/07/2017   Procedure: ESOPHAGOGASTRODUODENOSCOPY (EGD) WITH PROPOFOL;  Surgeon: Pasty Spillers, MD;  Location: [facility];  Service: Endoscopy;  Laterality: N/A;   EYE SURGERY     FLEXIBLE SIGMOIDOSCOPY N/A 11/09/2017   Procedure: FLEXIBLE SIGMOIDOSCOPY;  Surgeon: Pasty Spillers, MD;  Location: [facility];  Service: Endoscopy;  Laterality: N/A;   HERNIA REPAIR     inguinal   INGUINAL HERNIA REPAIR Bilateral 06/21/2018   Procedure: LAPAROSCOPIC BILATERAL INGUINAL HERNIA REPAIR;  Surgeon: Ancil Linsey, MD;  Location: [facility];  Service: General;  Laterality: Bilateral;   L3 TO L5 LAMINECTOMY FOR DECOMPRESSION  07/17/2016   [facility]   REPAIR OF CEREBROSPINAL FLUID LEAK N/A 09/08/2016   Procedure: Lumbar wound exploration, repair of pseudomenigocele;  Surgeon: Loura Halt Ditty, MD;  Location: [facility];  Service: Neurosurgery;  Laterality: N/A;  Lumbar wound exploration, repair of pseudomenigocele   RIGHT/LEFT HEART CATH AND CORONARY ANGIOGRAPHY N/A 01/29/2023   Procedure: RIGHT/LEFT HEART CATH AND CORONARY ANGIOGRAPHY;  Surgeon: Iran Ouch, MD;  Location: [facility];  Service: Cardiovascular;  Laterality: N/A;   SINUSOTOMY       Family History  Problem Relation Age of Onset   Heart failure Mother    Hypertension Mother    Diverticulitis Mother    Colitis Mother    Diabetes Father    Hypertension Father    Heart disease Father    Diabetes Sister    Heart failure Brother    Diverticulitis Brother       Social History   Tobacco  Use   Smoking status: Every Day    Current packs/day: 1.50    Average packs/day: 1.5 packs/day for 46.0 years (69.0 ttl pk-yrs)    Types: Cigarettes   Smokeless tobacco: Never  Vaping Use   Vaping status: Never Used  Substance Use Topics   Alcohol use: No    Alcohol/week: 0.0 standard drinks of alcohol   Drug use: No    Allergies  Allergen Reactions   Penicillins Hives and Swelling    As a child. Has patient had a PCN reaction causing immediate rash, facial/tongue/throat swelling, SOB or lightheadedness with hypotension: YES  Has patient had a PCN reaction causing severe rash involving mucus membranes or skin necrosis: UNKNOWN Has patient had a PCN reaction that required hospitalization: UNKNOWN Has patient had a PCN reaction occurring within the last 10 years: NO   Aspirin Nausea Only   Ibuprofen Nausea Only   Lyrica [Pregabalin] Nausea Only   Acetaminophen Nausea Only    Current Outpatient Medications  Medication Sig Dispense Refill   apixaban (ELIQUIS) 5 MG TABS tablet Take 1 tablet (5 mg total) by mouth 2 (two) times daily. 28 tablet    blood glucose meter kit and supplies KIT Dispense based on patient and insurance preference. Use up to tid E11.65, LON:99 1 each 0   Budeson-Glycopyrrol-Formoterol (BREZTRI AEROSPHERE) 160-9-4.8 MCG/ACT AERO Inhale 2 puffs into the lungs 2 (two) times daily. Patient receives via [employer] Patient Assistance. 32.1 g 3   dapagliflozin propanediol (FARXIGA) 10 MG TABS tablet Take 1 tablet (10 mg total) by mouth daily. 90 tablet 3   digoxin (LANOXIN) 0.125 MG tablet Take 0.5 tablets (0.0625 mg total) by mouth daily. 90 tablet 3   gabapentin (NEURONTIN) 300 MG capsule Take 300-600 mg by mouth See admin instructions. Take 1 capsule (300 mg) by mouth in the morning, take 1 capsule (300 mg) by mouth at noon, and 2 capsules (600 mg) by mouth in the evening     Insulin Degludec (TRESIBA) 100 UNIT/ML SOLN Inject 8 Units into the skin daily at 12 noon. 15 mL 1    Lancet Devices MISC 1 each by Does not apply route 3 (three) times daily as needed. What ins will cover dx:E11.65, LON:99 tid 100 each 12   metoprolol succinate (TOPROL-XL) 200 MG 24 hr tablet Take 1 tablet (200 mg total) by mouth daily. Take with or immediately following a meal. 30 tablet 11   pravastatin (PRAVACHOL) 20 MG tablet Take 1 tablet (20 mg total) by mouth daily with supper. 90 tablet 3   tiZANidine (ZANAFLEX) 4 MG tablet Take 4 mg by mouth every 12 (twelve) hours.     traMADol (ULTRAM) 50 MG tablet Take 50 mg by mouth every 12 (twelve) hours.     triamcinolone  cream (KENALOG) 0.1 % Apply topically 2 (two) times daily.     Vitamin D, Ergocalciferol, (DRISDOL) 1.25 MG (50000 UNIT) CAPS capsule TAKE 1 CAPSULE BY MOUTH EVERY 14 DAYS 6 capsule 0   cetirizine (ZYRTEC) 10 MG tablet Take 1 tablet (10 mg total) by mouth at bedtime as needed for allergies or rhinitis. (Patient not taking: Reported on 10/09/2023) 30 tablet 1   losartan (COZAAR) 25 MG tablet Take 0.5 tablets (12.5 mg total) by mouth daily. Skip the dose if systolic BP less than 110 mmHg 30 tablet 5   potassium chloride SA (KLOR-CON M) 20 MEQ tablet Take 1 tablet (20 mEq total) by mouth daily. (Patient not taking: Reported on 10/09/2023) 30 tablet 0   torsemide (DEMADEX) 20 MG tablet Take 1 tablet (20 mg total) by mouth daily before breakfast. With potassium pill and hold losartan when you take this medication (Patient not taking: Reported on 10/09/2023) 30 tablet 0   No current facility-administered medications for this visit.      REVIEW OF SYSTEMS (Negative unless checked)  Constitutional: [] Weight loss  [] Fever  [] Chills Cardiac: [] Chest pain   [] Chest pressure   [] Palpitations   [] Shortness of breath when laying flat   [] Shortness of breath at rest   [x] Shortness of breath with exertion. Vascular:  [x] Pain in legs with walking   [] Pain in legs at rest   [] Pain in legs when laying flat   [x] Claudication   [] Pain in feet when  walking  [x] Pain in feet at rest  [] Pain in feet when laying flat   [] History of DVT   [] Phlebitis   [] Swelling in legs   [] Varicose veins   [] Non-healing ulcers Pulmonary:   [] Uses home oxygen   [] Productive cough   [] Hemoptysis   [] Wheeze  [x] COPD   [] Asthma Neurologic:  [] Dizziness  [] Blackouts   [] Seizures   [] History of stroke   [] History of TIA  [] Aphasia   [] Temporary blindness   [] Dysphagia   [] Weakness or numbness in arms   [] Weakness or numbness in legs Musculoskeletal:  [x] Arthritis   [] Joint swelling   [] Joint pain   [] Low back pain Hematologic:  [] Easy bruising  [] Easy bleeding   [] Hypercoagulable state   [] Anemic  [] Hepatitis Gastrointestinal:  [] Blood in stool   [] Vomiting blood  [x] Gastroesophageal reflux/heartburn   [] Abdominal pain Genitourinary:  [] Chronic kidney disease   [] Difficult urination  [] Frequent urination  [] Burning with urination   [] Hematuria Skin:  [] Rashes   [] Ulcers   [] Wounds Psychological:  [] History of anxiety   []  History of major depression.    Physical Exam BP 133/82   Pulse 66   Resp 16   Ht 5\' 11"  (1.803 m)   Wt 157 lb 3.2 oz (71.3 kg)   BMI 21.92 kg/m  Gen:  WD/WN, NAD Head: [place]/AT, No temporalis wasting.  Ear/Nose/Throat: Hearing grossly intact, nares w/o erythema or drainage, oropharynx w/o Erythema/Exudate Eyes: Conjunctiva clear, sclera non-icteric  Neck: trachea midline.  No JVD.  Pulmonary:  Good air movement, respirations not labored, no use of accessory muscles  Cardiac: irregular Vascular:  Vessel Right Left  Radial Palpable Palpable                          DP 1+ 2+  PT 1+ 1+   Gastrointestinal:. No masses, surgical incisions, or scars. Musculoskeletal: M/S 5/5 throughout.  Extremities without ischemic changes.  No deformity or atrophy. No edema. Neurologic: Sensation grossly intact in extremities.  Symmetrical.  Speech is fluent. Motor exam as listed above. Psychiatric: Judgment intact, Mood & affect appropriate for pt's  clinical situation. Dermatologic: No rashes or ulcers noted.  No cellulitis or open wounds.    Radiology VAS [place] ABI WITH/WO TBI Result Date: 10/05/2023  LOWER EXTREMITY DOPPLER STUDY Patient Name:  TOLLIE CANADA  Date of Exam:   09/28/2023 Medical Rec #: [id number]        Accession #:    [id number] Date of Birth: Apr 15, 1956        Patient Gender: M Patient Age:   60 years Exam Location:  [facility] Procedure:      VAS [place] ABI WITH/WO TBI Referring Phys: Rosetta Posner --------------------------------------------------------------------------------  Indications: Peripheral artery disease. rt leg pain  Comparison Study: 03/2022 Performing Technologist: Salvadore Farber RVT  Examination Guidelines: A complete evaluation includes at minimum, Doppler waveform signals and systolic blood pressure reading at the level of bilateral brachial, anterior tibial, and posterior tibial arteries, when vessel segments are accessible. Bilateral testing is considered an integral part of a complete examination. Photoelectric Plethysmograph (PPG) waveforms and toe systolic pressure readings are included as required and additional duplex testing as needed. Limited examinations for reoccurring indications may be performed as noted.  ABI Findings: +---------+------------------+-----+---------+--------+ Right    Rt Pressure (mmHg)IndexWaveform Comment  +---------+------------------+-----+---------+--------+ Brachial 153                                      +---------+------------------+-----+---------+--------+ PTA      119               0.77 triphasic         +---------+------------------+-----+---------+--------+ DP       123               0.79 biphasic          +---------+------------------+-----+---------+--------+ Great Toe96                0.62 Abnormal          +---------+------------------+-----+---------+--------+ +---------+------------------+-----+--------+-------+ Left     Lt Pressure  (mmHg)IndexWaveformComment +---------+------------------+-----+--------+-------+ Brachial 155                                    +---------+------------------+-----+--------+-------+ PTA      153               0.99 biphasic        +---------+------------------+-----+--------+-------+ DP       148               0.95 biphasic        +---------+------------------+-----+--------+-------+ Great Toe139               0.90 Normal          +---------+------------------+-----+--------+-------+ +-------+-----------+-----------+------------+------------+ ABI/TBIToday's ABIToday's TBIPrevious ABIPrevious TBI +-------+-----------+-----------+------------+------------+ Right  .79        .62        1.00        1.03         +-------+-----------+-----------+------------+------------+ Left   .99        .90        1.16        1.01         +-------+-----------+-----------+------------+------------+ Right ABIs and TBIs appear decreased compared to prior study on 03/2022.  Summary: Right: Resting  right ankle-brachial index indicates moderate right lower extremity arterial disease. The right toe-brachial index is abnormal. Left: Resting left ankle-brachial index is within normal range. The left toe-brachial index is normal. *See table(s) above for measurements and observations.  Electronically signed by [name] MD on 10/05/2023 at 7:19:46 AM.    Final     Labs Recent Results (from the past 2160 hours)  POCT HgB A1C     Status: Abnormal   Collection Time: 07/20/23  2:10 PM  Result Value Ref Range   Hemoglobin A1C 10.5 (A) 4.0 - 5.6 %   HbA1c POC (<> result, manual entry)     HbA1c, POC (prediabetic range)     HbA1c, POC (controlled diabetic range)    VAS [place] ABI WITH/WO TBI     Status: None   Collection Time: 09/28/23  3:16 PM  Result Value Ref Range   Right ABI .79    Left ABI .99     Assessment/Plan:  Atherosclerosis of native arteries of extremity with intermittent  claudication (HCC) His right ABI had dropped down to 0.79.  His left ABI remains normal at 1.01.   We discussed there may still be some sort of neurogenic component as well, but with the significant drop in the ABIs and the deterioration in his symptoms and a heavy smoker it is likely he now has a significant arterial insufficiency component as well.  This has become disabling to him, and he is very desirous to have intervention.  He is already on Eliquis and a statin agent.  I have discussed the risks and benefits of angiography with possible revascularization.  Patient voices understanding and is agreeable to proceed.  Essential (primary) hypertension blood pressure control important in reducing the progression of atherosclerotic disease. On appropriate oral medications.   Type 2 diabetes mellitus with diabetic polyneuropathy, with long-term current use of insulin (HCC) blood glucose control important in reducing the progression of atherosclerotic disease. Also, involved in wound healing. On appropriate medications. Last Hgb A1c was over 10.  HLD (hyperlipidemia) lipid control important in reducing the progression of atherosclerotic disease. Continue statin therapy      [name] 10/09/2023, 11:21 AM   This note was created with Dragon medical transcription system.  Any errors from dictation are unintentional.

## 2023-10-09 NOTE — Assessment & Plan Note (Signed)
His right ABI had dropped down to 0.79.  His left ABI remains normal at 1.01.   We discussed there may still be some sort of neurogenic component as well, but with the significant drop in the ABIs and the deterioration in his symptoms and a heavy smoker it is likely he now has a significant arterial insufficiency component as well.  This has become disabling to him, and he is very desirous to have intervention.  He is already on Eliquis and a statin agent.  I have discussed the risks and benefits of angiography with possible revascularization.  Patient voices understanding and is agreeable to proceed.

## 2023-10-09 NOTE — Telephone Encounter (Signed)
I attempted to contact the patient to schedule him for a right leg angio with Dr. Wyn Quaker. A message was left for a return call.

## 2023-10-09 NOTE — Assessment & Plan Note (Signed)
blood pressure control important in reducing the progression of atherosclerotic disease. On appropriate oral medications.

## 2023-10-09 NOTE — Assessment & Plan Note (Signed)
lipid control important in reducing the progression of atherosclerotic disease. Continue statin therapy

## 2023-10-09 NOTE — Progress Notes (Signed)
Patient ID: Mark Nash, male   DOB: [birth year], 68 y.o.   MRN: [id number]  Chief Complaint  Patient presents with   New Patient (Initial Visit)    Ref Excell Seltzer consult with ABI    HPI Mark Nash is a 68 y.o. male.  I am asked to see the patient by Dr. Carlynn Purl for evaluation of PAD.  We had checked him about a year and a half ago and his perfusion was found to be intact at that time.  He continues to smoke heavily.  Over the past couple of months, he has began developing more pain in his right leg with short distances of walking.  He has known neurogenic issues and he related it largely to that problem.  No real left leg symptoms.  His primary care physician astutely reordered noninvasive arterial studies and these were performed in our office about 2 weeks ago.  His right ABI had dropped down to 0.79.  His left ABI remains normal at 1.01.     Past Medical History:  Diagnosis Date   Arthritis    Asthma    Benign neoplasm of ascending colon    polyps   Constipation    COPD (chronic obstructive pulmonary disease) (HCC)    NO INHALER   Diabetes mellitus without complication (HCC)    Type 2   Diverticulosis    Dyspnea    RARE-WITH EXERTION DUE TO COPD   GERD (gastroesophageal reflux disease)    Hepatitis C    History of kidney stones    H/O   Hyperlipidemia    Hypertension 06/10/2018   currently not under control   Left inguinal hernia    Peripheral vascular disease (HCC)    Restless legs    Schizoaffective disorder (HCC)    Pt denies   Thrombocytopenia (HCC)    Vitamin D deficiency    Wears dentures    full upper and lower    Past Surgical History:  Procedure Laterality Date   BACK SURGERY     CARDIOVERSION N/A 05/20/2021   Procedure: CARDIOVERSION;  Surgeon: Iran Ouch, MD;  Location: [facility];  Service: Cardiovascular;  Laterality: N/A;   CATARACT EXTRACTION W/PHACO Right 10/31/2021   Procedure: CATARACT EXTRACTION PHACO AND INTRAOCULAR LENS PLACEMENT  (IOC) RIGHT DIABETIC;  Surgeon: Nevada Crane, MD;  Location: [facility];  Service: Ophthalmology;  Laterality: Right;  Diabetic 4.34 00:42.8   COLONOSCOPY WITH PROPOFOL N/A 09/07/2017   Procedure: COLONOSCOPY WITH PROPOFOL;  Surgeon: Pasty Spillers, MD;  Location: [facility];  Service: Endoscopy;  Laterality: N/A;   ESOPHAGOGASTRODUODENOSCOPY (EGD) WITH PROPOFOL N/A 12/07/2017   Procedure: ESOPHAGOGASTRODUODENOSCOPY (EGD) WITH PROPOFOL;  Surgeon: Pasty Spillers, MD;  Location: [facility];  Service: Endoscopy;  Laterality: N/A;   EYE SURGERY     FLEXIBLE SIGMOIDOSCOPY N/A 11/09/2017   Procedure: FLEXIBLE SIGMOIDOSCOPY;  Surgeon: Pasty Spillers, MD;  Location: [facility];  Service: Endoscopy;  Laterality: N/A;   HERNIA REPAIR     inguinal   INGUINAL HERNIA REPAIR Bilateral 06/21/2018   Procedure: LAPAROSCOPIC BILATERAL INGUINAL HERNIA REPAIR;  Surgeon: Ancil Linsey, MD;  Location: [facility];  Service: General;  Laterality: Bilateral;   L3 TO L5 LAMINECTOMY FOR DECOMPRESSION  07/17/2016   [facility]   REPAIR OF CEREBROSPINAL FLUID LEAK N/A 09/08/2016   Procedure: Lumbar wound exploration, repair of pseudomenigocele;  Surgeon: Loura Halt Ditty, MD;  Location: [facility];  Service: Neurosurgery;  Laterality: N/A;  Lumbar wound exploration, repair of pseudomenigocele   RIGHT/LEFT HEART CATH AND CORONARY ANGIOGRAPHY N/A 01/29/2023   Procedure: RIGHT/LEFT HEART CATH AND CORONARY ANGIOGRAPHY;  Surgeon: Iran Ouch, MD;  Location: [facility];  Service: Cardiovascular;  Laterality: N/A;   SINUSOTOMY       Family History  Problem Relation Age of Onset   Heart failure Mother    Hypertension Mother    Diverticulitis Mother    Colitis Mother    Diabetes Father    Hypertension Father    Heart disease Father    Diabetes Sister    Heart failure Brother    Diverticulitis Brother       Social History   Tobacco  Use   Smoking status: Every Day    Current packs/day: 1.50    Average packs/day: 1.5 packs/day for 46.0 years (69.0 ttl pk-yrs)    Types: Cigarettes   Smokeless tobacco: Never  Vaping Use   Vaping status: Never Used  Substance Use Topics   Alcohol use: No    Alcohol/week: 0.0 standard drinks of alcohol   Drug use: No    Allergies  Allergen Reactions   Penicillins Hives and Swelling    As a child. Has patient had a PCN reaction causing immediate rash, facial/tongue/throat swelling, SOB or lightheadedness with hypotension: YES  Has patient had a PCN reaction causing severe rash involving mucus membranes or skin necrosis: UNKNOWN Has patient had a PCN reaction that required hospitalization: UNKNOWN Has patient had a PCN reaction occurring within the last 10 years: NO   Aspirin Nausea Only   Ibuprofen Nausea Only   Lyrica [Pregabalin] Nausea Only   Acetaminophen Nausea Only    Current Outpatient Medications  Medication Sig Dispense Refill   apixaban (ELIQUIS) 5 MG TABS tablet Take 1 tablet (5 mg total) by mouth 2 (two) times daily. 28 tablet    blood glucose meter kit and supplies KIT Dispense based on patient and insurance preference. Use up to tid E11.65, LON:99 1 each 0   Budeson-Glycopyrrol-Formoterol (BREZTRI AEROSPHERE) 160-9-4.8 MCG/ACT AERO Inhale 2 puffs into the lungs 2 (two) times daily. Patient receives via [employer] Patient Assistance. 32.1 g 3   dapagliflozin propanediol (FARXIGA) 10 MG TABS tablet Take 1 tablet (10 mg total) by mouth daily. 90 tablet 3   digoxin (LANOXIN) 0.125 MG tablet Take 0.5 tablets (0.0625 mg total) by mouth daily. 90 tablet 3   gabapentin (NEURONTIN) 300 MG capsule Take 300-600 mg by mouth See admin instructions. Take 1 capsule (300 mg) by mouth in the morning, take 1 capsule (300 mg) by mouth at noon, and 2 capsules (600 mg) by mouth in the evening     Insulin Degludec (TRESIBA) 100 UNIT/ML SOLN Inject 8 Units into the skin daily at 12 noon. 15 mL 1    Lancet Devices MISC 1 each by Does not apply route 3 (three) times daily as needed. What ins will cover dx:E11.65, LON:99 tid 100 each 12   metoprolol succinate (TOPROL-XL) 200 MG 24 hr tablet Take 1 tablet (200 mg total) by mouth daily. Take with or immediately following a meal. 30 tablet 11   pravastatin (PRAVACHOL) 20 MG tablet Take 1 tablet (20 mg total) by mouth daily with supper. 90 tablet 3   tiZANidine (ZANAFLEX) 4 MG tablet Take 4 mg by mouth every 12 (twelve) hours.     traMADol (ULTRAM) 50 MG tablet Take 50 mg by mouth every 12 (twelve) hours.     triamcinolone  cream (KENALOG) 0.1 % Apply topically 2 (two) times daily.     Vitamin D, Ergocalciferol, (DRISDOL) 1.25 MG (50000 UNIT) CAPS capsule TAKE 1 CAPSULE BY MOUTH EVERY 14 DAYS 6 capsule 0   cetirizine (ZYRTEC) 10 MG tablet Take 1 tablet (10 mg total) by mouth at bedtime as needed for allergies or rhinitis. (Patient not taking: Reported on 10/09/2023) 30 tablet 1   losartan (COZAAR) 25 MG tablet Take 0.5 tablets (12.5 mg total) by mouth daily. Skip the dose if systolic BP less than 110 mmHg 30 tablet 5   potassium chloride SA (KLOR-CON M) 20 MEQ tablet Take 1 tablet (20 mEq total) by mouth daily. (Patient not taking: Reported on 10/09/2023) 30 tablet 0   torsemide (DEMADEX) 20 MG tablet Take 1 tablet (20 mg total) by mouth daily before breakfast. With potassium pill and hold losartan when you take this medication (Patient not taking: Reported on 10/09/2023) 30 tablet 0   No current facility-administered medications for this visit.      REVIEW OF SYSTEMS (Negative unless checked)  Constitutional: [] Weight loss  [] Fever  [] Chills Cardiac: [] Chest pain   [] Chest pressure   [] Palpitations   [] Shortness of breath when laying flat   [] Shortness of breath at rest   [x] Shortness of breath with exertion. Vascular:  [x] Pain in legs with walking   [] Pain in legs at rest   [] Pain in legs when laying flat   [x] Claudication   [] Pain in feet when  walking  [x] Pain in feet at rest  [] Pain in feet when laying flat   [] History of DVT   [] Phlebitis   [] Swelling in legs   [] Varicose veins   [] Non-healing ulcers Pulmonary:   [] Uses home oxygen   [] Productive cough   [] Hemoptysis   [] Wheeze  [x] COPD   [] Asthma Neurologic:  [] Dizziness  [] Blackouts   [] Seizures   [] History of stroke   [] History of TIA  [] Aphasia   [] Temporary blindness   [] Dysphagia   [] Weakness or numbness in arms   [] Weakness or numbness in legs Musculoskeletal:  [x] Arthritis   [] Joint swelling   [] Joint pain   [] Low back pain Hematologic:  [] Easy bruising  [] Easy bleeding   [] Hypercoagulable state   [] Anemic  [] Hepatitis Gastrointestinal:  [] Blood in stool   [] Vomiting blood  [x] Gastroesophageal reflux/heartburn   [] Abdominal pain Genitourinary:  [] Chronic kidney disease   [] Difficult urination  [] Frequent urination  [] Burning with urination   [] Hematuria Skin:  [] Rashes   [] Ulcers   [] Wounds Psychological:  [] History of anxiety   []  History of major depression.    Physical Exam BP 133/82   Pulse 66   Resp 16   Ht 5\' 11"  (1.803 m)   Wt 157 lb 3.2 oz (71.3 kg)   BMI 21.92 kg/m  Gen:  WD/WN, NAD Head: [place]/AT, No temporalis wasting.  Ear/Nose/Throat: Hearing grossly intact, nares w/o erythema or drainage, oropharynx w/o Erythema/Exudate Eyes: Conjunctiva clear, sclera non-icteric  Neck: trachea midline.  No JVD.  Pulmonary:  Good air movement, respirations not labored, no use of accessory muscles  Cardiac: irregular Vascular:  Vessel Right Left  Radial Palpable Palpable                          DP 1+ 2+  PT 1+ 1+   Gastrointestinal:. No masses, surgical incisions, or scars. Musculoskeletal: M/S 5/5 throughout.  Extremities without ischemic changes.  No deformity or atrophy. No edema. Neurologic: Sensation grossly intact in extremities.  Symmetrical.  Speech is fluent. Motor exam as listed above. Psychiatric: Judgment intact, Mood & affect appropriate for pt's  clinical situation. Dermatologic: No rashes or ulcers noted.  No cellulitis or open wounds.    Radiology VAS [place] ABI WITH/WO TBI Result Date: 10/05/2023  LOWER EXTREMITY DOPPLER STUDY Patient Name:  TOLLIE CANADA  Date of Exam:   09/28/2023 Medical Rec #: [id number]        Accession #:    [id number] Date of Birth: Apr 15, 1956        Patient Gender: M Patient Age:   60 years Exam Location:  [facility] Procedure:      VAS [place] ABI WITH/WO TBI Referring Phys: Rosetta Posner --------------------------------------------------------------------------------  Indications: Peripheral artery disease. rt leg pain  Comparison Study: 03/2022 Performing Technologist: Salvadore Farber RVT  Examination Guidelines: A complete evaluation includes at minimum, Doppler waveform signals and systolic blood pressure reading at the level of bilateral brachial, anterior tibial, and posterior tibial arteries, when vessel segments are accessible. Bilateral testing is considered an integral part of a complete examination. Photoelectric Plethysmograph (PPG) waveforms and toe systolic pressure readings are included as required and additional duplex testing as needed. Limited examinations for reoccurring indications may be performed as noted.  ABI Findings: +---------+------------------+-----+---------+--------+ Right    Rt Pressure (mmHg)IndexWaveform Comment  +---------+------------------+-----+---------+--------+ Brachial 153                                      +---------+------------------+-----+---------+--------+ PTA      119               0.77 triphasic         +---------+------------------+-----+---------+--------+ DP       123               0.79 biphasic          +---------+------------------+-----+---------+--------+ Great Toe96                0.62 Abnormal          +---------+------------------+-----+---------+--------+ +---------+------------------+-----+--------+-------+ Left     Lt Pressure  (mmHg)IndexWaveformComment +---------+------------------+-----+--------+-------+ Brachial 155                                    +---------+------------------+-----+--------+-------+ PTA      153               0.99 biphasic        +---------+------------------+-----+--------+-------+ DP       148               0.95 biphasic        +---------+------------------+-----+--------+-------+ Great Toe139               0.90 Normal          +---------+------------------+-----+--------+-------+ +-------+-----------+-----------+------------+------------+ ABI/TBIToday's ABIToday's TBIPrevious ABIPrevious TBI +-------+-----------+-----------+------------+------------+ Right  .79        .62        1.00        1.03         +-------+-----------+-----------+------------+------------+ Left   .99        .90        1.16        1.01         +-------+-----------+-----------+------------+------------+ Right ABIs and TBIs appear decreased compared to prior study on 03/2022.  Summary: Right: Resting  right ankle-brachial index indicates moderate right lower extremity arterial disease. The right toe-brachial index is abnormal. Left: Resting left ankle-brachial index is within normal range. The left toe-brachial index is normal. *See table(s) above for measurements and observations.  Electronically signed by [name] MD on 10/05/2023 at 7:19:46 AM.    Final     Labs Recent Results (from the past 2160 hours)  POCT HgB A1C     Status: Abnormal   Collection Time: 07/20/23  2:10 PM  Result Value Ref Range   Hemoglobin A1C 10.5 (A) 4.0 - 5.6 %   HbA1c POC (<> result, manual entry)     HbA1c, POC (prediabetic range)     HbA1c, POC (controlled diabetic range)    VAS [place] ABI WITH/WO TBI     Status: None   Collection Time: 09/28/23  3:16 PM  Result Value Ref Range   Right ABI .79    Left ABI .99     Assessment/Plan:  Atherosclerosis of native arteries of extremity with intermittent  claudication (HCC) His right ABI had dropped down to 0.79.  His left ABI remains normal at 1.01.   We discussed there may still be some sort of neurogenic component as well, but with the significant drop in the ABIs and the deterioration in his symptoms and a heavy smoker it is likely he now has a significant arterial insufficiency component as well.  This has become disabling to him, and he is very desirous to have intervention.  He is already on Eliquis and a statin agent.  I have discussed the risks and benefits of angiography with possible revascularization.  Patient voices understanding and is agreeable to proceed.  Essential (primary) hypertension blood pressure control important in reducing the progression of atherosclerotic disease. On appropriate oral medications.   Type 2 diabetes mellitus with diabetic polyneuropathy, with long-term current use of insulin (HCC) blood glucose control important in reducing the progression of atherosclerotic disease. Also, involved in wound healing. On appropriate medications. Last Hgb A1c was over 10.  HLD (hyperlipidemia) lipid control important in reducing the progression of atherosclerotic disease. Continue statin therapy      [name] 10/09/2023, 11:21 AM   This note was created with Dragon medical transcription system.  Any errors from dictation are unintentional.

## 2023-10-10 ENCOUNTER — Other Ambulatory Visit: Payer: Self-pay | Admitting: Pharmacist

## 2023-10-10 ENCOUNTER — Ambulatory Visit: Payer: Self-pay | Admitting: *Deleted

## 2023-10-10 DIAGNOSIS — E1142 Type 2 diabetes mellitus with diabetic polyneuropathy: Secondary | ICD-10-CM

## 2023-10-10 MED ORDER — ONETOUCH DELICA PLUS LANCET30G MISC
3 refills | Status: AC
Start: 2023-10-10 — End: ?

## 2023-10-10 MED ORDER — ONETOUCH VERIO VI STRP
ORAL_STRIP | 3 refills | Status: AC
Start: 2023-10-10 — End: ?

## 2023-10-10 MED ORDER — ONETOUCH VERIO FLEX SYSTEM W/DEVICE KIT
PACK | 0 refills | Status: AC
Start: 2023-10-10 — End: ?

## 2023-10-10 NOTE — Progress Notes (Unsigned)
 10/10/2023 Name: Mark Nash MRN: [id number] DOB: 02-Aug-1956  Chief Complaint  Patient presents with   Medication Assistance   Medication Management    WING SCHOCH is a 68 y.o. year old male who was referred to the pharmacist by their PCP for assistance in managing diabetes, medication access, and complex medication management.    Received a message from [facility] today requesting call to patient as he as out of test strips/unable to check his blood sugar.   Subjective:   Care Team: Primary Care Provider: Alba Cory, MD ; Next Scheduled Visit: 11/19/2023 Cardiologist: Julien Nordmann MD; Next Scheduled Visit: 12/24/2023 Pain Management: Ronita Hipps, MD Nurse Care Manager: Rodney Langton, RN; Next Scheduled Visit: 11/08/2023  Medication Access/Adherence  Current Pharmacy:  [facility] [address], [place] - [address] [place] [address] Phone: [phone] Fax: [phone]  [facility] - [place], [place] - [address] [place] [address] Phone: [phone] Fax: [phone]   Patient reports affordability concerns with their medications: Yes  Patient reports access/transportation concerns to their pharmacy: No  Patient reports adherence concerns with their medications:  No      Diabetes:   Current medications:  - Farxiga 10 mg daily - Tresiba FlexTouch 8 units daily   Medications tried in the past: Steglatro, metformin, Levemir    Denies checking home blood sugar recently as needs a new blood sugar meter   Patient denies hypoglycemic s/sx including dizziness, shakiness, sweating.    Statin therapy: pravastatin 20 mg daily   Current medication access support: enrolled in patient assistance for Farxiga through [employer] and Ozempic through [employer] through 08/27/2024 - Reports has not yet received Ozempic from the assistance program   Heart Failure/Hypertension:   Current medications:   ACEi/ARB/ARNI: losartan 25 mg -1/2 tablet daily. (Skips the dose if systolic BP less than 110 mmHg) SGLT2i: Farxiga 10 mg daily Beta blocker: metoprolol ER 200 mg daily Other: digoxin 0.125 mg - 1/2 tablet (0.0625 mg) daily   Reports has upper arm blood pressure monitor, but denies checking recently Denies checking home weight recently, but has home scale   Previous therapies tried: torsemide and Klor-Con - reports Cardiology aware that he is not taking these   Patient denies volume overload signs or symptoms including shortness of breath, lower extremity edema, increased use of pillows at night   Denies symptom of hypotension    Current medication access support: enrolled in patient assistance for Farxiga through [employer] through 08/27/2024   Atrial Fibrillation:   Current medications: Rate Control: metoprolol ER 200 mg daily - digoxin 0.125 mg - 1/2 tablet (0.0625 mg) daily Anticoagulation Regimen: Eliquis 5 mg twice daily    CHA2DS2-VASc Score = 5  The patient's score is based upon: CHF History: 1 HTN History: 1 Diabetes History: 1 Stroke History: 0 Vascular Disease History: 1 Age Score: 1 Gender Score: 0   Denies monitoring home blood pressure recently   Patient denies hypotensive s/sx including dizziness, lightheadedness.   Current physical activity: starting back walking again gradually   Current medication access support: None.      COPD:   Current medications: - Breztri inhaler - 2 puffs twice daily             Confirms rinses and spits out after each use   Patient interested in having rescue inhaler to use if needed for wheezing or shortness of breath  Current  medication access support: enrolled in patient assistance for Breztri through [employer] - Reports was unable to complete [employer] Digital Assistant for re-enrollment as does not text or use email   Objective:  Lab Results  Component Value Date   HGBA1C 10.5 (A) 07/20/2023    Lab Results  Component  Value Date   CREATININE 0.91 07/04/2023   BUN 16 07/04/2023   NA 141 07/04/2023   K 4.6 07/04/2023   CL 100 07/04/2023   CO2 24 07/04/2023    Lab Results  Component Value Date   CHOL 103 11/15/2022   HDL 61 11/15/2022   LDLCALC 29 11/15/2022   TRIG 46 11/15/2022   CHOLHDL 1.7 11/15/2022    Medications Reviewed Today     Reviewed by [name], RPH-CPP (Pharmacist) on 10/10/23 at 1430  Med List Status: <None>   Medication Order Taking? Sig Documenting Provider Last Dose Status Informant  apixaban (ELIQUIS) 5 MG TABS tablet [id number] Yes Take 1 tablet (5 mg total) by mouth 2 (two) times daily. Reather Littler D, NP Taking Active   blood glucose meter kit and supplies KIT [id number]  Dispense based on patient and insurance preference. Use up to tid E11.65, LON:99 Alba Cory, MD  Active Self  Blood Glucose Monitoring Suppl (ONETOUCH VERIO FLEX SYSTEM) w/Device KIT [id number] Yes Use to check blood sugar up to four times daily as directed Alba Cory, MD  Active   Budeson-Glycopyrrol-Formoterol (BREZTRI AEROSPHERE) 160-9-4.8 MCG/ACT Sandrea Matte [id number] Yes Inhale 2 puffs into the lungs 2 (two) times daily. Patient receives via [employer] Patient Assistance. Alba Cory, MD Taking Active Self  cetirizine (ZYRTEC) 10 MG tablet [id number]  Take 1 tablet (10 mg total) by mouth at bedtime as needed for allergies or rhinitis.  Patient not taking: Reported on 10/09/2023   Danelle Berry, PA-C  Active   dapagliflozin propanediol (FARXIGA) 10 MG TABS tablet [id number] Yes Take 1 tablet (10 mg total) by mouth daily. Alba Cory, MD Taking Active Self  digoxin (LANOXIN) 0.125 MG tablet [id number] Yes Take 0.5 tablets (0.0625 mg total) by mouth daily. Sondra Barges, PA-C Taking Active   gabapentin (NEURONTIN) 300 MG capsule [id number]  Take 300-600 mg by mouth See admin instructions. Take 1 capsule (300 mg) by mouth in the morning, take 1 capsule (300 mg) by mouth at noon, and 2 capsules (600 mg)  by mouth in the evening [provider]  Active Self  glucose blood (ONETOUCH VERIO) test strip [id number] Yes Use to check blood sugar up to four times daily as directed Alba Cory, MD  Active   Insulin Degludec (TRESIBA) 100 UNIT/ML SOLN [id number] Yes Inject 8 Units into the skin daily at 12 noon. Alba Cory, MD Taking Active   Lancet Devices MISC [id number]  1 each by Does not apply route 3 (three) times daily as needed. What ins will cover dx:E11.65, LON:99 tid Margarita Mail, DO  Active Self  Lancets [facility] Larose Kells PLUS [place]) MISC [id number] Yes Use to check blood sugar up to four times daily as directed Alba Cory, MD  Active   losartan (COZAAR) 25 MG tablet [id number] Yes Take 0.5 tablets (12.5 mg total) by mouth daily. Skip the dose if systolic BP less than 110 mmHg Reather Littler D, NP Taking Active   metoprolol succinate (TOPROL-XL) 200 MG 24 hr tablet [id number] Yes Take 1 tablet (200 mg total) by mouth daily. Take with or immediately following a meal. Gillis Santa, MD Taking Active   potassium chloride SA (KLOR-CON M)  20 MEQ tablet [id number] No Take 1 tablet (20 mEq total) by mouth daily.  Patient not taking: Reported on 10/04/2023   Alba Cory, MD Not Taking Active   pravastatin (PRAVACHOL) 20 MG tablet [id number]  Take 1 tablet (20 mg total) by mouth daily with supper. Alba Cory, MD  Active   tiZANidine (ZANAFLEX) 4 MG tablet [id number]  Take 4 mg by mouth every 12 (twelve) hours. Yevonne Pax, MD  Active Self           Med Note Dollene Primrose   Wed Feb 07, 2023  2:38 PM)    torsemide (DEMADEX) 20 MG tablet [id number] No Take 1 tablet (20 mg total) by mouth daily before breakfast. With potassium pill and hold losartan when you take this medication  Patient not taking: Reported on 10/04/2023   Alba Cory, MD Not Taking Active   traMADol (ULTRAM) 50 MG tablet [id number]  Take 50 mg by mouth every 12 (twelve) hours. Yevonne Pax, MD  Active Self   triamcinolone cream (KENALOG) 0.1 % [id number]  Apply topically 2 (two) times daily. [provider]  Active   Vitamin D, Ergocalciferol, (DRISDOL) 1.25 MG (50000 UNIT) CAPS capsule [id number]  TAKE 1 CAPSULE BY MOUTH EVERY 14 DAYS Alba Cory, MD  Active   Med List Note Earlyne Iba, RN 10/03/18 1438): UDS 10/03/18              Assessment/Plan:   Assessment/Plan:    Comprehensive medication review performed; medication list updated in electronic medical record - Caution patient for risk of dizziness/sedation with gabapentin, tizanidine or tramadol particularly if taken in combination Patient verbalizes understanding; denies dizziness or lightheadedness    Patient to return to Cardiology for ordered lab work - Patient confirms plans to complete labs within the next week   Will collaborate with PCP and CPhT for support to patient with re-enrollment in patient assistance from [employer]Me for both [name] and Farxiga for 2025   Diabetes: - Reviewed long term cardiovascular and renal outcomes of uncontrolled blood sugar - Reviewed goal A1c, goal fasting, and goal 2 hour post prandial glucose - Reviewed dietary modifications including importance of having regular well-balanced meals and snacks throughout the day, while controlling carbohydrate portion sizes            Encourage to review nutrition labels for carbohydrate content of foods - Have counseled patient on s/s of low blood sugar and how to treat lows - Send prescriptions for One Touch (preferred brand with patient's health plan) Verio meter and testing supplies to pharmacy for patient today - Patient to pick up new prescriptions blood sugar testing supplies and restart checking glucose, keep log of results and have this record to review at upcoming medical appointments. Patient to contact provider office sooner if needed for readings outside of established parameters or symptoms   Heart Failure/Hypertension: - Reviewed  appropriate blood pressure monitoring technique and reviewed goal blood pressure - Reviewed to weigh daily and when to contact cardiology with weight gain - Reviewed dietary modifications including: Encourage to review nutrition labels for sodium content of foods - Recommend to monitor home blood pressure, keep log of results and have this record to review at upcoming medical appointments. Patient to contact provider office sooner if needed for readings outside of established parameters or symptoms   Atrial Fibrillation: - Recommend to monitor home blood pressure, keep log of results and have this record to review at upcoming medical appointments. Patient to contact provider  office sooner if needed for readings outside of established parameters or symptoms - Patient plans to follow up with Cardiology to see if any samples of Eliquis available to help with affordability while in the coverage gap, but otherwise plans to pick up refills from pharmacy   COPD: - Discuss inhaler technique, including importance of rinsing out his mouth after each use of Breztri     Follow Up Plan: Clinical Pharmacist will follow up with patient by telephone on 11/07/2023 at 3:30 PM    Estelle Grumbles, PharmD, [facility] [phone]

## 2023-10-10 NOTE — Patient Instructions (Signed)
Visit Information  Thank you for taking time to visit with me today. Please don't hesitate to contact me if I can be of assistance to you before our next scheduled telephone appointment.  Following are the goals we discussed today:  Listen for call from pharmacist, make sure you answer call or call her back.  Check weights and blood pressure daily. Take all medications as instructed  Try to decrease smoking.  Our next appointment is by telephone on 3/13  Please call the care guide team at [phone] if you need to cancel or reschedule your appointment.   Please call the Suicide and Crisis Lifeline: 988 call the [place] National Suicide Prevention Lifeline: 214-072-6417 or TTY: 986-237-1268 TTY (928)480-1849) to talk to a trained counselor call 1-800-273-TALK (toll free, 24 hour hotline) call 911 if you are experiencing a Mental Health or Behavioral Health Crisis or need someone to talk to.  The patient verbalized understanding of instructions, educational materials, and care plan provided today and agreed to receive a mailed copy of patient instructions, educational materials, and care plan.   The patient has been provided with contact information for the care management team and has been advised to call with any health related questions or concerns.   Rodney Langton, RN, MSN, CCM [facility], [facility] RN Care Coordinator Direct Dial: [phone] / Main [phone] Fax [phone] Email: [email or web address]: [email or web address]

## 2023-10-10 NOTE — Patient Outreach (Signed)
  Care Coordination   Follow Up Visit Note   10/10/2023 Name: Mark Nash MRN: [id number] DOB: [birth year]  Mark Nash is a 68 y.o. year old male who sees Alba Cory, MD for primary care. I spoke with  Mark Nash by phone today.  What matters to the patients health and wellness today?  Report need for vascular intervention for PAD.  Still receptive to better management of DM and CHF once he has all equipment, collaborating with pharmacist for glucose meter. Denies any urgent concerns, encouraged to contact this care manager with questions.      Goals Addressed             This Visit's Progress    Management of chronic medical conditions   On track    Interventions Today    Flowsheet Row Most Recent Value  Chronic Disease   Chronic disease during today's visit Diabetes, Congestive Heart Failure (CHF), Hypertension (HTN), Chronic Obstructive Pulmonary Disease (COPD), Other  [Recently diagnosed with PAD]  General Interventions   General Interventions Discussed/Reviewed General Interventions Reviewed, Doctor Visits, Communication with, Durable Medical Equipment (DME)  Doctor Visits Discussed/Reviewed Doctor Visits Reviewed, PCP, Specialist  [Reviewed upcoming: ENT on 2/14 for recurent cough and throat ulcer, 2/24 with pharmacist, and PCP on 3/24]  Durable Medical Equipment (DME) Other, Glucomoter  [Report he received letter saying his meter was defective, think he needs a new one. Confirmed he does have working scale]  PCP/Specialist Visits Compliance with follow-up visit  [vascular appointment yesterday, patient state they discussed angiography for PAD, waiting for insurance approval]  Communication with Pharmacists  [collaborated with pharmacist to get patient new meter for better DM management]  Exercise Interventions   Exercise Discussed/Reviewed Weight Managment  Weight Management Weight maintenance  [Encouraged to monitor weights daily]  Education Interventions    Education Provided Provided Education  Provided Verbal Education On Nutrition, Foot Care, Labs, Blood Sugar Monitoring, When to see the doctor, Medication  [Meds reviewed, confirmed he is taking all, especially diabetic meds. Reminded of need to check blood sugars daily, he will do so once he has new meter. Active with podiatry for routine foot care.]  Labs Reviewed Hgb A1c  [Aware of need to decrease A1C from 10.5]  Nutrition Interventions   Nutrition Discussed/Reviewed Nutrition Reviewed, Adding fruits and vegetables, Decreasing sugar intake              SDOH assessments and interventions completed:  No     Care Coordination Interventions:  Yes, provided   Follow up plan: Follow up call scheduled for 3/13    Encounter Outcome:  Patient Visit Completed   Rodney Langton, RN, MSN, CCM [place]  [facility], [facility] RN Care Coordinator Direct Dial: [phone] / Main [phone] Fax [phone] Email: [email or web address] Website: [email or web address]

## 2023-10-11 ENCOUNTER — Other Ambulatory Visit: Payer: Self-pay | Admitting: Family Medicine

## 2023-10-11 ENCOUNTER — Other Ambulatory Visit: Payer: Self-pay | Admitting: Pharmacist

## 2023-10-11 DIAGNOSIS — Z794 Long term (current) use of insulin: Secondary | ICD-10-CM

## 2023-10-11 MED ORDER — OZEMPIC (0.25 OR 0.5 MG/DOSE) 2 MG/1.5ML ~~LOC~~ SOPN: 0.2500 mg | PEN_INJECTOR | SUBCUTANEOUS | 0 refills | Status: DC

## 2023-10-11 NOTE — Patient Instructions (Signed)
Goals Addressed             This Visit's Progress    Pharmacy Goals       The goal A1c is less than 7%. This is the best way to reduce the risk of the long term complications of diabetes, including heart disease, kidney disease, eye disease, strokes, and nerve damage. An A1c of less than 7% corresponds with fasting sugars less than 130 and 2 hour after meal sugars less than 180. Please check your blood sugar daily.   If you need to reach out to patient assistance programs regarding refills or to find out the status of your application, you can do so by calling:  [employer] at [phone] [employer] at [phone]  Start Ozempic 0.25 mg once weekly. This medication may cause stomach upset, queasiness, or constipation, especially when first starting. This generally improves over time. Call our office if these symptoms occur and worsen, or if you have severe symptoms such as vomiting, diarrhea, or stomach pain.    Check your blood pressure twice weekly, and any time you have concerning symptoms like headache, chest pain, dizziness, shortness of breath, or vision changes.   Our goal is less than 130/80.  To appropriately check your blood pressure, make sure you do the following:  1) Avoid caffeine, exercise, or tobacco products for 30 minutes before checking. Empty your bladder. 2) Sit with your back supported in a flat-backed chair. Rest your arm on something flat (arm of the chair, table, etc). 3) Sit still with your feet flat on the floor, resting, for at least 5 minutes.  4) Check your blood pressure. Take 1-2 readings.  5) Write down these readings and bring with you to any provider appointments.  Bring your home blood pressure machine with you to a provider's office for accuracy comparison at least once a year.   Make sure you take your blood pressure medications before you come to any office visit, even if you were asked to fast for labs.  Thank you!  Estelle Grumbles,  PharmD, [facility] [phone]

## 2023-10-11 NOTE — Progress Notes (Signed)
   10/11/2023  Patient ID: Mark Nash, male   DOB: [birth year], 68 y.o.   MRN: [id number]  Outreach to [employer] patient assistance program on behalf of patient. Speak with representative Shelana to request 1 month supply voucher for patient for Ozempic.   Collaborated with PCP to request provider send a prescription for 1 month supply for Ozempic to pharmacy for him to use with this voucher  - Provider sent prescription  Outreach to [facility] to provide and provide voucher information - provide update to patient.  Patient states has reviewed "how to use" video from manufacturer and denies further questions about starting Ozempic 0.25 mg weekly injection or about device.  Follow Up Plan: Clinical Pharmacist will follow up with patient by telephone on 11/07/2023 at 3:30 PM    Estelle Grumbles, PharmD, [facility] [phone]

## 2023-10-12 DIAGNOSIS — K219 Gastro-esophageal reflux disease without esophagitis: Secondary | ICD-10-CM | POA: Diagnosis not present

## 2023-10-12 DIAGNOSIS — R053 Chronic cough: Secondary | ICD-10-CM | POA: Diagnosis not present

## 2023-10-15 ENCOUNTER — Telehealth (INDEPENDENT_AMBULATORY_CARE_PROVIDER_SITE_OTHER): Payer: Self-pay

## 2023-10-15 NOTE — Telephone Encounter (Signed)
 Spoke with the patient to schedule him for a right leg angio with Dr. Wyn Quaker. Patient was offered 10/17/23 and 10/22/23. Patient is scheduled on 10/22/23 with a 7:45 am arrival time to the [facility]. Pre- procedure instructions were discussed and will be mailed.

## 2023-10-22 ENCOUNTER — Other Ambulatory Visit: Payer: Self-pay | Admitting: Pharmacist

## 2023-10-22 ENCOUNTER — Encounter: Admission: RE | Payer: Self-pay | Source: Home / Self Care

## 2023-10-22 ENCOUNTER — Ambulatory Visit: Admission: RE | Admit: 2023-10-22 | Payer: PPO | Source: Home / Self Care | Admitting: Vascular Surgery

## 2023-10-22 DIAGNOSIS — I70219 Atherosclerosis of native arteries of extremities with intermittent claudication, unspecified extremity: Secondary | ICD-10-CM

## 2023-10-22 SURGERY — LOWER EXTREMITY ANGIOGRAPHY
Anesthesia: Moderate Sedation | Site: Leg Lower | Laterality: Right

## 2023-10-23 ENCOUNTER — Telehealth (INDEPENDENT_AMBULATORY_CARE_PROVIDER_SITE_OTHER): Payer: Self-pay

## 2023-10-23 NOTE — Telephone Encounter (Signed)
 Spoke with the patient and he has been rescheduled for his right leg angio with Dr. Wyn Quaker. Patient scheduled on 11/05/23 with a 6:45 am arrival time to the [facility]. Patient was offered 10/29/23 and declined. Pre-procedure instructions were discussed and will be mailed.

## 2023-10-24 DIAGNOSIS — Z961 Presence of intraocular lens: Secondary | ICD-10-CM | POA: Diagnosis not present

## 2023-10-24 DIAGNOSIS — H2512 Age-related nuclear cataract, left eye: Secondary | ICD-10-CM | POA: Diagnosis not present

## 2023-10-24 DIAGNOSIS — H34832 Tributary (branch) retinal vein occlusion, left eye, with macular edema: Secondary | ICD-10-CM | POA: Diagnosis not present

## 2023-10-24 LAB — HM DIABETES EYE EXAM

## 2023-10-30 ENCOUNTER — Telehealth: Payer: Self-pay

## 2023-10-30 DIAGNOSIS — E1142 Type 2 diabetes mellitus with diabetic polyneuropathy: Secondary | ICD-10-CM

## 2023-10-30 NOTE — Telephone Encounter (Signed)
 Patient was identified as falling into the [place] Measure - Diabetes.   Patient was: Referred to pharmacy for chronic disease management.

## 2023-11-05 ENCOUNTER — Encounter
Admission: RE | Disposition: A | Discharge status: Home / Self Care | Payer: Self-pay | Source: Home / Self Care | Attending: Vascular Surgery

## 2023-11-05 ENCOUNTER — Encounter: Payer: Self-pay | Admitting: Vascular Surgery

## 2023-11-05 ENCOUNTER — Ambulatory Visit
Admission: RE | Admit: 2023-11-05 | Discharge: 2023-11-05 | Disposition: A | Discharge status: Home / Self Care | Payer: PPO | Attending: Vascular Surgery | Admitting: Vascular Surgery

## 2023-11-05 DIAGNOSIS — I70211 Atherosclerosis of native arteries of extremities with intermittent claudication, right leg: Secondary | ICD-10-CM | POA: Insufficient documentation

## 2023-11-05 DIAGNOSIS — Z7901 Long term (current) use of anticoagulants: Secondary | ICD-10-CM | POA: Insufficient documentation

## 2023-11-05 DIAGNOSIS — Z79899 Other long term (current) drug therapy: Secondary | ICD-10-CM | POA: Diagnosis not present

## 2023-11-05 DIAGNOSIS — I70219 Atherosclerosis of native arteries of extremities with intermittent claudication, unspecified extremity: Secondary | ICD-10-CM

## 2023-11-05 DIAGNOSIS — E1142 Type 2 diabetes mellitus with diabetic polyneuropathy: Secondary | ICD-10-CM | POA: Insufficient documentation

## 2023-11-05 DIAGNOSIS — I10 Essential (primary) hypertension: Secondary | ICD-10-CM | POA: Insufficient documentation

## 2023-11-05 DIAGNOSIS — I70213 Atherosclerosis of native arteries of extremities with intermittent claudication, bilateral legs: Secondary | ICD-10-CM | POA: Diagnosis not present

## 2023-11-05 DIAGNOSIS — Z7984 Long term (current) use of oral hypoglycemic drugs: Secondary | ICD-10-CM | POA: Insufficient documentation

## 2023-11-05 DIAGNOSIS — Z794 Long term (current) use of insulin: Secondary | ICD-10-CM | POA: Insufficient documentation

## 2023-11-05 DIAGNOSIS — F17210 Nicotine dependence, cigarettes, uncomplicated: Secondary | ICD-10-CM | POA: Insufficient documentation

## 2023-11-05 DIAGNOSIS — E785 Hyperlipidemia, unspecified: Secondary | ICD-10-CM | POA: Insufficient documentation

## 2023-11-05 HISTORY — PX: LOWER EXTREMITY ANGIOGRAPHY: CATH118251

## 2023-11-05 LAB — GLUCOSE, CAPILLARY
Glucose-Capillary: 175 mg/dL — ABNORMAL HIGH (ref 70–99)
Glucose-Capillary: 180 mg/dL — ABNORMAL HIGH (ref 70–99)

## 2023-11-05 LAB — CREATININE, SERUM
Creatinine, Ser: 0.88 mg/dL (ref 0.61–1.24)
GFR, Estimated: >60 mL/min (ref >60)

## 2023-11-05 LAB — BUN: BUN: 18 mg/dL (ref 8–23)

## 2023-11-05 SURGERY — LOWER EXTREMITY ANGIOGRAPHY
Anesthesia: Moderate Sedation | Site: Leg Lower | Laterality: Right

## 2023-11-05 MED ORDER — ONDANSETRON HCL 4 MG/2ML IJ SOLN: 4.0000 mg | Freq: Four times a day (QID) | INTRAMUSCULAR | Status: DC | PRN

## 2023-11-05 MED ORDER — FENTANYL CITRATE PF 50 MCG/ML IJ SOSY
PREFILLED_SYRINGE | INTRAMUSCULAR | Status: AC
  Filled 2023-11-05: qty 1

## 2023-11-05 MED ORDER — MIDAZOLAM HCL 2 MG/2ML IJ SOLN
INTRAMUSCULAR | Status: DC | PRN
  Administered 2023-11-05: 1 mg via INTRAVENOUS

## 2023-11-05 MED ORDER — MIDAZOLAM HCL 5 MG/5ML IJ SOLN
INTRAMUSCULAR | Status: AC
Start: 2023-11-05 — End: ?
  Filled 2023-11-05: qty 5

## 2023-11-05 MED ORDER — DIPHENHYDRAMINE HCL 50 MG/ML IJ SOLN: 50.0000 mg | Freq: Once | INTRAMUSCULAR | Status: DC | PRN

## 2023-11-05 MED ORDER — HEPARIN (PORCINE) IN NACL 1000-0.9 UT/500ML-% IV SOLN
INTRAVENOUS | Status: DC | PRN
  Administered 2023-11-05: 1000 mL

## 2023-11-05 MED ORDER — HYDROMORPHONE HCL 1 MG/ML IJ SOLN: 1.0000 mg | Freq: Once | INTRAMUSCULAR | Status: DC | PRN

## 2023-11-05 MED ORDER — VANCOMYCIN HCL IN DEXTROSE 1-5 GM/200ML-% IV SOLN
1000.0000 mg | Freq: Once | INTRAVENOUS | Status: AC
Start: 2023-11-05 — End: 2023-11-05
  Administered 2023-11-05: 1000 mg via INTRAVENOUS

## 2023-11-05 MED ORDER — FENTANYL CITRATE (PF) 100 MCG/2ML IJ SOLN
INTRAMUSCULAR | Status: DC | PRN
  Administered 2023-11-05: 50 ug via INTRAVENOUS

## 2023-11-05 MED ORDER — LIDOCAINE-EPINEPHRINE (PF) 1 %-1:200000 IJ SOLN
INTRAMUSCULAR | Status: DC | PRN
  Administered 2023-11-05: 10 mL

## 2023-11-05 MED ORDER — MIDAZOLAM HCL 2 MG/ML PO SYRP: 8.0000 mg | ORAL_SOLUTION | Freq: Once | ORAL | Status: DC | PRN

## 2023-11-05 MED ORDER — CEFAZOLIN SODIUM-DEXTROSE 2-4 GM/100ML-% IV SOLN: 2.0000 g | INTRAVENOUS | Status: DC

## 2023-11-05 MED ORDER — SODIUM CHLORIDE 0.9 % IV SOLN
INTRAVENOUS | Status: DC
Start: 2023-11-05 — End: 2023-11-05
  Administered 2023-11-05: 500 mL via INTRAVENOUS

## 2023-11-05 MED ORDER — IODIXANOL 320 MG/ML IV SOLN
INTRAVENOUS | Status: DC | PRN
  Administered 2023-11-05: 35 mL

## 2023-11-05 MED ORDER — HEPARIN SODIUM (PORCINE) 1000 UNIT/ML IJ SOLN
INTRAMUSCULAR | Status: AC
  Filled 2023-11-05: qty 10

## 2023-11-05 MED ORDER — METHYLPREDNISOLONE SODIUM SUCC 125 MG IJ SOLR: 125.0000 mg | Freq: Once | INTRAMUSCULAR | Status: DC | PRN

## 2023-11-05 MED ORDER — FAMOTIDINE 20 MG PO TABS: 40.0000 mg | ORAL_TABLET | Freq: Once | ORAL | Status: DC | PRN

## 2023-11-05 SURGICAL SUPPLY — 9 items
CATH ANGIO 5F PIGTAIL 65CM (CATHETERS) IMPLANT
COVER PROBE ULTRASOUND 5X96 (MISCELLANEOUS) IMPLANT
DEVICE STARCLOSE SE CLOSURE (Vascular Products) IMPLANT
GLIDEWIRE ADV .035X260CM (WIRE) IMPLANT
PACK ANGIOGRAPHY (CUSTOM PROCEDURE TRAY) ×1 IMPLANT
SHEATH BRITE TIP 5FRX11 (SHEATH) IMPLANT
SYR MEDRAD MARK 7 150ML (SYRINGE) IMPLANT
TUBING CONTRAST HIGH PRESS 72 (TUBING) IMPLANT
WIRE GUIDERIGHT .035X150 (WIRE) IMPLANT

## 2023-11-05 NOTE — Op Note (Signed)
 [facility]  Percutaneous Study/Intervention Procedural Note   Date of Surgery: 11/05/2023  Surgeon(s):Ellajane Stong    Assistants:none  Pre-operative Diagnosis: PAD with claudication bilateral lower extremities right greater than left  Post-operative diagnosis:  Same  Procedure(s) Performed:             1.  Ultrasound guidance for vascular access left femoral artery             2.  Catheter placement into right common femoral artery from left femoral approach             3.  Aortogram and selective right lower extremity angiogram             4.  StarClose closure device left femoral artery  EBL: 5 cc  Contrast: 35 cc  Fluoro Time: 1 minute  Moderate Conscious Sedation Time: approximately 14 minutes using 1 mg of Versed and 50 mcg of Fentanyl              Indications:  Patient is a 68 y.o.male with disabling claudication symptoms particularly in the right lower extremity. The patient has noninvasive study showing a drop in his right ABI after previously being stable. The patient is brought in for angiography for further evaluation and potential treatment.  Risks and benefits are discussed and informed consent is obtained.   Procedure:  The patient was identified and appropriate procedural time out was performed.  The patient was then placed supine on the table and prepped and draped in the usual sterile fashion. Moderate conscious sedation was administered during a face to face encounter with the patient throughout the procedure with my supervision of the RN administering medicines and monitoring the patient's vital signs, pulse oximetry, telemetry and mental status throughout from the start of the procedure until the patient was taken to the recovery room. Ultrasound was used to evaluate the left common femoral artery.  It was patent but heavily diseased.  A digital ultrasound image was acquired.  A Seldinger needle was used to access the left common femoral artery  under direct ultrasound guidance and a permanent image was performed.  A 0.035 J wire was advanced without resistance and a 5Fr sheath was placed.  Pigtail catheter was placed into the aorta and an AP aortogram was performed. This demonstrated normal renal arteries and normal aorta and iliac segments without significant stenosis. I then crossed the aortic bifurcation and advanced to the right femoral head. Selective right lower extremity angiogram was then performed. This demonstrated high-grade, near occlusive stenosis of greater than 90% in the right common femoral artery in the mid and distal segments with some spillover disease in the origins of the profundus and superficial femoral arteries.  The superficial femoral artery then normalized.  Distal flow was somewhat sluggish and difficult to opacify, but there appeared to be a greater than 75% stenosis in the mid popliteal artery and then a greater than 75% stenosis in the tibioperoneal trunk.  The anterior tibial artery was small but I did not identify focal stenoses but again imaging was limited.  The peroneal and posterior tibial arteries were continuous after the stenosis in the tibioperoneal trunk. It was felt that the patient would clearly need a hybrid procedure with a right femoral endarterectomy as well as right popliteal and tibial intervention.  Oblique imaging was also performed of the left femoral artery and this was found to have a greater than 75% stenosis as well spilling into the proximal SFA and  profunda femoris artery.  I elected to terminate the procedure. The sheath was removed and StarClose closure device was deployed in the left femoral artery with excellent hemostatic result. The patient was taken to the recovery room in stable condition having tolerated the procedure well.  Findings:               Aortogram:  This demonstrated normal renal arteries and normal aorta and iliac segments without significant stenosis.             Right  lower Extremity:  This demonstrated high-grade, near occlusive stenosis of greater than 90% in the right common femoral artery in the mid and distal segments with some spillover disease in the origins of the profundus and superficial femoral arteries.  The superficial femoral artery then normalized.  Distal flow was somewhat sluggish and difficult to opacify, but there appeared to be a greater than 75% stenosis in the mid popliteal artery and then a greater than 75% stenosis in the tibioperoneal trunk.  The anterior tibial artery was small but I did not identify focal stenoses but again imaging was limited.  The peroneal and posterior tibial arteries were continuous after the stenosis in the tibioperoneal trunk. It was felt that the patient would clearly need a hybrid procedure with a right femoral endarterectomy as well as right popliteal and tibial intervention.  Left Lower Extremity: The left common femoral artery had greater than 75% stenosis that was highly calcific and spilling into the origins of the SFA and profunda femoris arteries.   Disposition: Patient was taken to the recovery room in stable condition having tolerated the procedure well.  Complications: None  [name] 11/05/2023 9:03 AM   This note was created with Dragon Medical transcription system. Any errors in dictation are purely unintentional.

## 2023-11-05 NOTE — Interval H&P Note (Signed)
 History and Physical Interval Note:  11/05/2023 8:03 AM  Mark Nash  has presented today for surgery, with the diagnosis of RLE Angio    ASO w claudication.  The various methods of treatment have been discussed with the patient and family. After consideration of risks, benefits and other options for treatment, the patient has consented to  Procedure(s): Lower Extremity Angiography (Right) as a surgical intervention.  The patient's history has been reviewed, patient examined, no change in status, stable for surgery.  I have reviewed the patient's chart and labs.  Questions were answered to the patient's satisfaction.     [name]

## 2023-11-07 ENCOUNTER — Other Ambulatory Visit: Payer: Self-pay | Admitting: Pharmacist

## 2023-11-07 DIAGNOSIS — E1142 Type 2 diabetes mellitus with diabetic polyneuropathy: Secondary | ICD-10-CM

## 2023-11-07 DIAGNOSIS — I5020 Unspecified systolic (congestive) heart failure: Secondary | ICD-10-CM

## 2023-11-07 NOTE — Patient Instructions (Signed)
 Goals Addressed             This Visit's Progress    Pharmacy Goals       The goal A1c is less than 7%. This is the best way to reduce the risk of the long term complications of diabetes, including heart disease, kidney disease, eye disease, strokes, and nerve damage. An A1c of less than 7% corresponds with fasting sugars less than 130 and 2 hour after meal sugars less than 180. Please check your blood sugar daily.   If you need to reach out to patient assistance programs regarding refills or to find out the status of your application, you can do so by calling:  [employer] at [phone] [employer] at [phone]  Check your blood pressure twice weekly, and any time you have concerning symptoms like headache, chest pain, dizziness, shortness of breath, or vision changes.   Our goal is less than 130/80.  To appropriately check your blood pressure, make sure you do the following:  1) Avoid caffeine, exercise, or tobacco products for 30 minutes before checking. Empty your bladder. 2) Sit with your back supported in a flat-backed chair. Rest your arm on something flat (arm of the chair, table, etc). 3) Sit still with your feet flat on the floor, resting, for at least 5 minutes.  4) Check your blood pressure. Take 1-2 readings.  5) Write down these readings and bring with you to any provider appointments.  Bring your home blood pressure machine with you to a provider's office for accuracy comparison at least once a year.   Make sure you take your blood pressure medications before you come to any office visit, even if you were asked to fast for labs.  Thank you!  Estelle Grumbles, PharmD, [facility] [phone]

## 2023-11-07 NOTE — Progress Notes (Signed)
 11/07/2023 Name: Mark Nash MRN: [id number] DOB: 1955/10/04  Chief Complaint  Patient presents with   Medication Management   Medication Assistance    Mark Nash is a 68 y.o. year old male who presented for a telephone visit.   They were referred to the pharmacist by their PCP for assistance in managing diabetes and medication access.    Subjective:  Care Team: Primary Care Provider: Alba Cory, MD ; Next Scheduled Visit: 11/19/2023 Cardiologist: Julien Nordmann MD; Next Scheduled Visit: 12/24/2023 Pain Management: Ronita Hipps, MD Nurse Care Manager: Rodney Langton, RN; Next Scheduled Visit: 11/08/2023 Vein & Vascular Specialist: Annice Needy, MD   Medication Access/Adherence  Current Pharmacy:  [facility] [address] [place] - [address] [place] [address] Phone: [phone] Fax: [phone]  [facility] - [place], [place] - [address] [place] [address] Phone: [phone] Fax: [phone]   Patient reports affordability concerns with their medications: No  Patient reports access/transportation concerns to their pharmacy: No  Patient reports adherence concerns with their medications:  No     Reports currently awaiting call from Vein and Vascular Specialist regarding scheduling for surgery on both legs   Diabetes:   Current medications:  - Farxiga 10 mg daily - Tresiba FlexTouch 8 units daily - Reports has not yet started Ozempic as was advised to wait until after completes procedures/surgery with Vein and Vascular Specialist before starting   Medications tried in the past: Steglatro, metformin, Levemir    Reports picked up OneTouch Verio meter and testing supplies, but has not yet restarted home monitoring   Patient denies hypoglycemic s/sx including dizziness, shakiness, sweating.    Statin therapy: pravastatin 20 mg daily   Current medication access support: enrolled in patient  assistance for Farxiga through [employer] and Ozempic through [employer] through 08/27/2024 - Reports received a call from office that supply of Ozempic has arrived to office from assistance program; plans to pick up   Heart Failure/Hypertension:   Current medications:  ACEi/ARB/ARNI: losartan 25 mg -1/2 tablet daily. (Skips the dose if systolic BP less than 110 mmHg) SGLT2i: Farxiga 10 mg daily Beta blocker: metoprolol ER 200 mg daily Other: digoxin 0.125 mg - 1/2 tablet (0.0625 mg) daily   Reports has upper arm blood pressure monitor, but denies checking recently Denies checking home weight recently, but has home scale   Previous therapies tried: torsemide and Klor-Con - reports Cardiology aware that he is not taking these   Patient denies volume overload signs or symptoms including shortness of breath, lower extremity edema, increased use of pillows at night   Denies symptom of hypotension    Current medication access support: enrolled in patient assistance for Farxiga through [employer] through 08/27/2024    Atrial Fibrillation:   Current medications: Rate Control: metoprolol ER 200 mg daily - digoxin 0.125 mg - 1/2 tablet (0.0625 mg) daily Anticoagulation Regimen: Eliquis 5 mg twice daily    CHA2DS2-VASc Score = 5  This indicates a 7.2% annual risk of stroke. The patient's score is based upon: CHF History: 1 HTN History: 1 Diabetes History: 1 Stroke History: 0 Vascular Disease History: 1 Age Score: 1 Gender Score: 0   Denies monitoring home blood pressure recently   Patient denies hypotensive s/sx including dizziness, lightheadedness.    Current medication access support: None.      COPD:   Current medications: - Breztri inhaler - 2 puffs twice  daily             Confirms rinses and spits out after each use    Current medication access support: enrolled in patient assistance for Breztri through [employer] through 08/27/2024   Objective:  Lab Results  Component  Value Date   HGBA1C 10.5 (A) 07/20/2023    Lab Results  Component Value Date   CREATININE 0.88 11/05/2023   BUN 18 11/05/2023   NA 141 07/04/2023   K 4.6 07/04/2023   CL 100 07/04/2023   CO2 24 07/04/2023    Lab Results  Component Value Date   CHOL 103 11/15/2022   HDL 61 11/15/2022   LDLCALC 29 11/15/2022   TRIG 46 11/15/2022   CHOLHDL 1.7 11/15/2022    Current Outpatient Medications on File Prior to Visit  Medication Sig Dispense Refill   apixaban (ELIQUIS) 5 MG TABS tablet Take 1 tablet (5 mg total) by mouth 2 (two) times daily. 28 tablet    blood glucose meter kit and supplies KIT Dispense based on patient and insurance preference. Use up to tid E11.65, LON:99 1 each 0   Blood Glucose Monitoring Suppl (ONETOUCH VERIO FLEX SYSTEM) w/Device KIT Use to check blood sugar up to four times daily as directed 1 kit 0   Budeson-Glycopyrrol-Formoterol (BREZTRI AEROSPHERE) 160-9-4.8 MCG/ACT AERO Inhale 2 puffs into the lungs 2 (two) times daily. Patient receives via [employer] Patient Assistance. 32.1 g 3   cetirizine (ZYRTEC) 10 MG tablet Take 1 tablet (10 mg total) by mouth at bedtime as needed for allergies or rhinitis. (Patient not taking: Reported on 10/09/2023) 30 tablet 1   dapagliflozin propanediol (FARXIGA) 10 MG TABS tablet Take 1 tablet (10 mg total) by mouth daily. 90 tablet 3   digoxin (LANOXIN) 0.125 MG tablet Take 0.5 tablets (0.0625 mg total) by mouth daily. 90 tablet 3   gabapentin (NEURONTIN) 300 MG capsule Take 300-600 mg by mouth See admin instructions. Take 1 capsule (300 mg) by mouth in the morning, take 1 capsule (300 mg) by mouth at noon, and 2 capsules (600 mg) by mouth in the evening     glucose blood (ONETOUCH VERIO) test strip Use to check blood sugar up to four times daily as directed 400 each 3   Insulin Degludec (TRESIBA) 100 UNIT/ML SOLN Inject 8 Units into the skin daily at 12 noon. 15 mL 1   Lancet Devices MISC 1 each by Does not apply route 3 (three) times  daily as needed. What ins will cover dx:E11.65, LON:99 tid 100 each 12   Lancets (ONETOUCH DELICA PLUS LANCET30G) MISC Use to check blood sugar up to four times daily as directed 400 each 3   losartan (COZAAR) 25 MG tablet Take 0.5 tablets (12.5 mg total) by mouth daily. Skip the dose if systolic BP less than 110 mmHg 30 tablet 5   metoprolol succinate (TOPROL-XL) 200 MG 24 hr tablet Take 1 tablet (200 mg total) by mouth daily. Take with or immediately following a meal. 30 tablet 11   potassium chloride SA (KLOR-CON M) 20 MEQ tablet Take 1 tablet (20 mEq total) by mouth daily. (Patient not taking: Reported on 10/04/2023) 30 tablet 0   pravastatin (PRAVACHOL) 20 MG tablet Take 1 tablet (20 mg total) by mouth daily with supper. 90 tablet 3   Semaglutide,0.25 or 0.5MG /DOS, (OZEMPIC, 0.25 OR 0.5 MG/DOSE,) 2 MG/1.5ML SOPN Inject 0.25 mg into the skin once a week. 2 mL 0   tiZANidine (ZANAFLEX) 4 MG tablet Take  4 mg by mouth every 12 (twelve) hours.     torsemide (DEMADEX) 20 MG tablet Take 1 tablet (20 mg total) by mouth daily before breakfast. With potassium pill and hold losartan when you take this medication (Patient not taking: Reported on 10/04/2023) 30 tablet 0   traMADol (ULTRAM) 50 MG tablet Take 50 mg by mouth every 12 (twelve) hours.     triamcinolone cream (KENALOG) 0.1 % Apply topically 2 (two) times daily.     Vitamin D, Ergocalciferol, (DRISDOL) 1.25 MG (50000 UNIT) CAPS capsule TAKE 1 CAPSULE BY MOUTH EVERY 14 DAYS 6 capsule 0   No current facility-administered medications on file prior to visit.       Assessment/Plan:   Diabetes: - Reviewed long term cardiovascular and renal outcomes of uncontrolled blood sugar - Reviewed goal A1c, goal fasting, and goal 2 hour post prandial glucose - Reviewed dietary modifications including importance of having regular well-balanced meals and snacks throughout the day, while controlling carbohydrate portion sizes - Have counseled patient on s/s of  low blood sugar and how to treat lows - Encourage patient to restart checking glucose, keep log of results and have this record to review at upcoming medical appointments. Patient to contact provider office sooner if needed for readings outside of established parameters or symptoms - Patient to follow up with [employer] patient assistance program to as needed for refills of Ozempic and [employer] as needed for refills of Farxiga  Heart Failure/Hypertension: - Reviewed appropriate blood pressure monitoring technique and reviewed goal blood pressure - Recommend to restart monitoring home blood pressure, keep log of results and have this record to review at upcoming medical appointments. Patient to contact provider office sooner if needed for readings outside of established parameters or symptoms   Atrial Fibrillation: - Recommend to monitor home blood pressure, keep log of results and have this record to review at upcoming medical appointments. Patient to contact provider office sooner if needed for readings outside of established parameters or symptoms     COPD: - Have discussed inhaler technique, including importance of rinsing out his mouth after each use of Breztri   Follow Up Plan: Clinical Pharmacist will follow up with patient by telephone on 12/19/2023 at 3:30 PM    Estelle Grumbles, PharmD, [facility] [phone]

## 2023-11-08 ENCOUNTER — Ambulatory Visit: Payer: Self-pay | Admitting: *Deleted

## 2023-11-08 NOTE — Patient Outreach (Signed)
 Care Coordination   Follow Up Visit Note   11/08/2023 Name: RASHIDI LOH MRN: [id number] DOB: 01-19-1956  GRIFFIN GERRARD is a 68 y.o. year old male who sees Alba Cory, MD for primary care. I spoke with  Jeri Cos by phone today.  What matters to the patients health and wellness today?  Patient report he went to hospital  on Monday for angiogram for PAD but MD was unable to complete intervention due to extent of disease.  He state he is now needing surgery on both legs, waiting for call to schedule.  Denies any urgent concerns, encouraged to contact this care manager with questions.     Goals Addressed             This Visit's Progress    Management of chronic medical conditions   On track    Interventions Today    Flowsheet Row Most Recent Value  Chronic Disease   Chronic disease during today's visit Other, Diabetes, Hypertension (HTN)  [PAD]  General Interventions   General Interventions Discussed/Reviewed General Interventions Reviewed, Doctor Visits, Durable Medical Equipment (DME), Labs  Labs Hgb A1c every 3 months  [A1C remains elevated at 10.5, aware of goal less that 7]  Doctor Visits Discussed/Reviewed Doctor Visits Reviewed, Specialist  [Upcoming reviewed: PCP 3/24, Vascular 3/31, Pharmacist 4/23, and cardiology 4/28]  Durable Medical Equipment (DME) Glucomoter, BP Cuff  [Confirms he picked up new glucometer, advised to start monitoring blood sugars and blood pressure daily]  Exercise Interventions   Exercise Discussed/Reviewed Physical Activity  Physical Activity Discussed/Reviewed Physical Activity Reviewed  [Encouraged to increase activity as much as possible to help stay in best shape before surgery]  Education Interventions   Education Provided Provided Education  Provided Verbal Education On Blood Sugar Monitoring, Labs, Medication, Nutrition, When to see the doctor, Exercise  [Meds reviewed, denies any changes. Educated on the importance of smoking  cessation in relation to management of PAD. Advised on how controlled DM will help with surgery recovery]  Labs Reviewed Hgb A1c  Nutrition Interventions   Nutrition Discussed/Reviewed Nutrition Reviewed, Adding fruits and vegetables, Carbohydrate meal planning, Decreasing sugar intake  [Discussed appropriate foods to eat in order to keep blood sugar under control]  Pharmacy Interventions   Pharmacy Dicussed/Reviewed Pharmacy Topics Reviewed, Affording Medications, Medication Adherence  Medication Adherence Not taking medication  [Working with pharmacy team on medication management and chronic disease management. Some meds (example Ozempic) were ordered but unable to take until after surgery for his PAD.]              SDOH assessments and interventions completed:  No     Care Coordination Interventions:  Yes, provided   Follow up plan: Follow up call scheduled for 4/4 with D. Chilton Si, RNCM, patient aware.     Encounter Outcome:  Patient Visit Completed   Rodney Langton, RN, MSN, CCM [place]  [facility], [facility] RN Care Coordinator Direct Dial: [phone] / Main [phone] Fax [phone] Email: [email or web address]

## 2023-11-14 DIAGNOSIS — I872 Venous insufficiency (chronic) (peripheral): Secondary | ICD-10-CM | POA: Diagnosis not present

## 2023-11-19 ENCOUNTER — Encounter: Payer: Self-pay | Admitting: Family Medicine

## 2023-11-19 ENCOUNTER — Ambulatory Visit: Payer: Self-pay | Admitting: Family Medicine

## 2023-11-19 ENCOUNTER — Ambulatory Visit (INDEPENDENT_AMBULATORY_CARE_PROVIDER_SITE_OTHER): Payer: PPO | Admitting: Family Medicine

## 2023-11-19 VITALS — BP 126/74 | HR 88 | Resp 16 | Ht 71.0 in | Wt 154.7 lb

## 2023-11-19 DIAGNOSIS — I5020 Unspecified systolic (congestive) heart failure: Secondary | ICD-10-CM

## 2023-11-19 DIAGNOSIS — B182 Chronic viral hepatitis C: Secondary | ICD-10-CM | POA: Diagnosis not present

## 2023-11-19 DIAGNOSIS — J449 Chronic obstructive pulmonary disease, unspecified: Secondary | ICD-10-CM | POA: Diagnosis not present

## 2023-11-19 DIAGNOSIS — F200 Paranoid schizophrenia: Secondary | ICD-10-CM

## 2023-11-19 DIAGNOSIS — E538 Deficiency of other specified B group vitamins: Secondary | ICD-10-CM | POA: Diagnosis not present

## 2023-11-19 DIAGNOSIS — I739 Peripheral vascular disease, unspecified: Secondary | ICD-10-CM

## 2023-11-19 DIAGNOSIS — E785 Hyperlipidemia, unspecified: Secondary | ICD-10-CM | POA: Diagnosis not present

## 2023-11-19 DIAGNOSIS — Z794 Long term (current) use of insulin: Secondary | ICD-10-CM | POA: Diagnosis not present

## 2023-11-19 DIAGNOSIS — I482 Chronic atrial fibrillation, unspecified: Secondary | ICD-10-CM | POA: Diagnosis not present

## 2023-11-19 DIAGNOSIS — E1142 Type 2 diabetes mellitus with diabetic polyneuropathy: Secondary | ICD-10-CM

## 2023-11-19 LAB — POCT GLYCOSYLATED HEMOGLOBIN (HGB A1C): Hemoglobin A1C: 10.4 % — AB (ref 4.0–5.6)

## 2023-11-19 NOTE — Progress Notes (Signed)
 Name: Mark Nash   MRN: [id number]    DOB: September 21, 1955   Date:11/19/2023       Progress Note  Subjective  Chief Complaint  Chief Complaint  Patient presents with   Medical Management of Chronic Issues   HPI   Lumbar radiculitis: he had back surgery in 06/2016 and 08/2016. He is currently doing okay. Currently seeing  Dr. Welton Flakes and is taking , Tramadol, Tizanidine and higher dose gabapentin 300 mg morning, lunch and 2 at night, back pain is well controlled at this time 5-6/10 and stable    DMII: he is now on [place] 8 units and Farxiga 10 mg , he has not started Ozempic yet. His A1C is out of control at 10.4 % today He states his diet has not been good, he has been eating more sweets than usual. Explained importance of cutting down on carbohydrates He is also not checking his glucose lately. Explained if glucose fasting is above 140 needs to go up on insulin by 2 units every 3 days. Return in 1 month for fructosamine level . Neuropathy is stable with gabapentin    PAD/Atherosclerosis of aorta: he had multiple episodes of cellulitis in 2022 and surgeon decided to hold off on surgical procedure , he is on medical management. He recently had angiogram that showed severe stenosis on right lower extremity and 75 % on left tibioperoneal trunk. He continues to have claudication and needs to have surgery done soon. He continues to have claudication after walking for 5 minutes    COPD Moderate : he always has daily morning  productive cough. He has SOB  with activity but doing better since taking maintenance medication, Brezti  .  He is still smoking 1.5 - 2 packs daily.    Hyperlipidemia: taking Pravastatin 20 mg daily and denies myalgias. Last LDL was 29. She is due for labs today    HTN: taking Metoprolol and low dose Losartan  , BP is at goal , continue current dose    Schizophrenia/paranoid type : denies any hallucinations, no depressed mood or suicidal thoughts or ideation, not on medication  and refuses to see Psychiatrist. He states he helps his family.  Mother died in March 12, 2024 and brother died 05-24-2023he still has one sister. Unchanged    Hepatitis C antibody positive: he has a remote history of drug use from age 78 till 18. Used injectable drug use at the time, he has positive hepatitis C , he went to see GI but did not discuss hepatitis C. He has seen GI, he has a large polyp removed 2019 and was supposed to go back for repeat colonoscopy but he keeps postponing. He does not want to have it done , he states not going due to transportation. He states trying to have surgery first    Chronic Constipation: he states bowel movements have improvement   He had EGD and colonoscopy in 2019 , past due for repeat testing secondary to large sigmoid polyp, he states he will have it done after vascular surgery    Afib/Heart failure with reduced EF: CHADVasc score of 4, he is under the care of cardiologist  ,he is now on metoprolol XL, SGL-2 agonist , digoxin and  Eliquis . He bruises easily , denies palpitation or increase in sob . Denies orthopena    Patient Active Problem List   Diagnosis Date Noted   Atherosclerosis of native arteries of extremity with intermittent claudication (HCC) 10/09/2023  HFrEF (heart failure with reduced ejection fraction) (HCC) 01/30/2023   Chronic combined systolic and diastolic congestive heart failure (HCC) 01/28/2023   Pressure injury of skin 01/28/2023   Chronic atrial flutter (HCC) 01/26/2023   Acute systolic heart failure (HCC) 01/26/2023   Acute respiratory failure with hypoxia (HCC) 01/24/2023   Rhinovirus infection 01/23/2023   COPD exacerbation (HCC) 01/21/2023   Senile purpura (HCC) 11/15/2022   Chronic a-fib (HCC) 03/15/2022   Type 2 diabetes mellitus with diabetic polyneuropathy, with long-term current use of insulin (HCC) 03/16/2021   DDD (degenerative disc disease), lumbar 03/06/2019   Primary osteoarthritis involving multiple joints  03/06/2019   Lumbar radiculopathy 11/05/2018   Lumbar post-laminectomy syndrome 11/05/2018   Diabetic polyneuropathy associated with type 2 diabetes mellitus (HCC) 11/05/2018   CLE (columnar lined esophagus)    Gastric irritation    Duodenum ulcer    History of colonic polyps    Polyp of sigmoid colon    Diverticulosis of large intestine without diverticulitis    Benign neoplasm of ascending colon    Osteoarthritis of both hands 09/04/2017   Chronic pain of left knee 09/04/2017   Lymphedema 07/07/2016   Chronic hepatitis C without hepatic coma (HCC) 06/01/2016   Lumbar herniated disc 11/24/2015   Thrombocytopenia (HCC) 11/11/2015   Hepatitis C antibody test positive 07/13/2015   Chronic constipation 07/13/2015   Constipation 07/13/2015   Diabetes mellitus with coincident hypertension (HCC) 03/08/2015   Dyslipidemia 03/08/2015   Paranoid schizophrenia (HCC) 03/08/2015   Restless leg 03/08/2015   Callus of foot 03/08/2015   Claudication (HCC) 03/08/2015   Chronic obstructive pulmonary disease (HCC) 03/08/2015   Corn or callus 03/08/2015   Essential (primary) hypertension 03/08/2015   HLD (hyperlipidemia) 03/08/2015   Peripheral vascular disease (HCC) 03/08/2015   Polyneuropathy 03/08/2015   Current tobacco use 03/08/2015   Vitamin D deficiency 04/26/2009   Avitaminosis D 04/26/2009    Past Surgical History:  Procedure Laterality Date   BACK SURGERY     CARDIOVERSION N/A 05/20/2021   Procedure: CARDIOVERSION;  Surgeon: Iran Ouch, MD;  Location: [facility];  Service: Cardiovascular;  Laterality: N/A;   CATARACT EXTRACTION W/PHACO Right 10/31/2021   Procedure: CATARACT EXTRACTION PHACO AND INTRAOCULAR LENS PLACEMENT (IOC) RIGHT DIABETIC;  Surgeon: Nevada Crane, MD;  Location: [facility];  Service: Ophthalmology;  Laterality: Right;  Diabetic 4.34 00:42.8   COLONOSCOPY WITH PROPOFOL N/A 09/07/2017   Procedure: COLONOSCOPY WITH PROPOFOL;  Surgeon:  Pasty Spillers, MD;  Location: [facility];  Service: Endoscopy;  Laterality: N/A;   ESOPHAGOGASTRODUODENOSCOPY (EGD) WITH PROPOFOL N/A 12/07/2017   Procedure: ESOPHAGOGASTRODUODENOSCOPY (EGD) WITH PROPOFOL;  Surgeon: Pasty Spillers, MD;  Location: [facility];  Service: Endoscopy;  Laterality: N/A;   EYE SURGERY     FLEXIBLE SIGMOIDOSCOPY N/A 11/09/2017   Procedure: FLEXIBLE SIGMOIDOSCOPY;  Surgeon: Pasty Spillers, MD;  Location: [facility];  Service: Endoscopy;  Laterality: N/A;   HERNIA REPAIR     inguinal   INGUINAL HERNIA REPAIR Bilateral 06/21/2018   Procedure: LAPAROSCOPIC BILATERAL INGUINAL HERNIA REPAIR;  Surgeon: Ancil Linsey, MD;  Location: [facility];  Service: General;  Laterality: Bilateral;   L3 TO L5 LAMINECTOMY FOR DECOMPRESSION  07/17/2016   [facility]   LOWER EXTREMITY ANGIOGRAPHY Right 11/05/2023   Procedure: Lower Extremity Angiography;  Surgeon: Annice Needy, MD;  Location: [facility];  Service: Cardiovascular;  Laterality: Right;   REPAIR OF CEREBROSPINAL FLUID LEAK N/A 09/08/2016   Procedure:  Lumbar wound exploration, repair of pseudomenigocele;  Surgeon: Loura Halt Ditty, MD;  Location: [facility];  Service: Neurosurgery;  Laterality: N/A;  Lumbar wound exploration, repair of pseudomenigocele   RIGHT/LEFT HEART CATH AND CORONARY ANGIOGRAPHY N/A 01/29/2023   Procedure: RIGHT/LEFT HEART CATH AND CORONARY ANGIOGRAPHY;  Surgeon: Iran Ouch, MD;  Location: [facility];  Service: Cardiovascular;  Laterality: N/A;   SINUSOTOMY      Family History  Problem Relation Age of Onset   Heart failure Mother    Hypertension Mother    Diverticulitis Mother    Colitis Mother    Diabetes Father    Hypertension Father    Heart disease Father    Diabetes Sister    Heart failure Brother    Diverticulitis Brother     Social History   Tobacco Use   Smoking status: Every Day    Current packs/day: 1.50     Average packs/day: 1.5 packs/day for 46.0 years (69.0 ttl pk-yrs)    Types: Cigarettes   Smokeless tobacco: Never  Substance Use Topics   Alcohol use: No    Alcohol/week: 0.0 standard drinks of alcohol     Current Outpatient Medications:    apixaban (ELIQUIS) 5 MG TABS tablet, Take 1 tablet (5 mg total) by mouth 2 (two) times daily., Disp: 28 tablet, Rfl:    blood glucose meter kit and supplies KIT, Dispense based on patient and insurance preference. Use up to tid E11.65, LON:99, Disp: 1 each, Rfl: 0   Blood Glucose Monitoring Suppl (ONETOUCH VERIO FLEX SYSTEM) w/Device KIT, Use to check blood sugar up to four times daily as directed, Disp: 1 kit, Rfl: 0   Budeson-Glycopyrrol-Formoterol (BREZTRI AEROSPHERE) 160-9-4.8 MCG/ACT AERO, Inhale 2 puffs into the lungs 2 (two) times daily. Patient receives via [place] Patient Assistance., Disp: 32.1 g, Rfl: 3   dapagliflozin propanediol (FARXIGA) 10 MG TABS tablet, Take 1 tablet (10 mg total) by mouth daily., Disp: 90 tablet, Rfl: 3   digoxin (LANOXIN) 0.125 MG tablet, Take 0.5 tablets (0.0625 mg total) by mouth daily., Disp: 90 tablet, Rfl: 3   gabapentin (NEURONTIN) 300 MG capsule, Take 300-600 mg by mouth See admin instructions. Take 1 capsule (300 mg) by mouth in the morning, take 1 capsule (300 mg) by mouth at noon, and 2 capsules (600 mg) by mouth in the evening, Disp: , Rfl:    glucose blood (ONETOUCH VERIO) test strip, Use to check blood sugar up to four times daily as directed, Disp: 400 each, Rfl: 3   Insulin Degludec (TRESIBA) 100 UNIT/ML SOLN, Inject 8 Units into the skin daily at 12 noon., Disp: 15 mL, Rfl: 1   Lancet Devices MISC, 1 each by Does not apply route 3 (three) times daily as needed. What ins will cover dx:E11.65, LON:99 tid, Disp: 100 each, Rfl: 12   Lancets (ONETOUCH DELICA PLUS LANCET30G) MISC, Use to check blood sugar up to four times daily as directed, Disp: 400 each, Rfl: 3   losartan (COZAAR) 25 MG tablet, Take 0.5 tablets  (12.5 mg total) by mouth daily. Skip the dose if systolic BP less than 110 mmHg, Disp: 30 tablet, Rfl: 5   metoprolol succinate (TOPROL-XL) 200 MG 24 hr tablet, Take 1 tablet (200 mg total) by mouth daily. Take with or immediately following a meal., Disp: 30 tablet, Rfl: 11   pravastatin (PRAVACHOL) 20 MG tablet, Take 1 tablet (20 mg total) by mouth daily with supper., Disp: 90 tablet, Rfl: 3   Semaglutide,0.25  or 0.5MG /DOS, (OZEMPIC, 0.25 OR 0.5 MG/DOSE,) 2 MG/1.5ML SOPN, Inject 0.25 mg into the skin once a week., Disp: 2 mL, Rfl: 0   tiZANidine (ZANAFLEX) 4 MG tablet, Take 4 mg by mouth every 12 (twelve) hours., Disp: , Rfl:    traMADol (ULTRAM) 50 MG tablet, Take 50 mg by mouth every 12 (twelve) hours., Disp: , Rfl:    triamcinolone cream (KENALOG) 0.1 %, Apply topically 2 (two) times daily., Disp: , Rfl:    Vitamin D, Ergocalciferol, (DRISDOL) 1.25 MG (50000 UNIT) CAPS capsule, TAKE 1 CAPSULE BY MOUTH EVERY 14 DAYS, Disp: 6 capsule, Rfl: 0   cetirizine (ZYRTEC) 10 MG tablet, Take 1 tablet (10 mg total) by mouth at bedtime as needed for allergies or rhinitis. (Patient not taking: Reported on 10/04/2023), Disp: 30 tablet, Rfl: 1   potassium chloride SA (KLOR-CON M) 20 MEQ tablet, Take 1 tablet (20 mEq total) by mouth daily. (Patient not taking: Reported on 11/19/2023), Disp: 30 tablet, Rfl: 0   torsemide (DEMADEX) 20 MG tablet, Take 1 tablet (20 mg total) by mouth daily before breakfast. With potassium pill and hold losartan when you take this medication (Patient not taking: Reported on 11/19/2023), Disp: 30 tablet, Rfl: 0  Allergies  Allergen Reactions   Penicillins Hives and Swelling    As a child. Has patient had a PCN reaction causing immediate rash, facial/tongue/throat swelling, SOB or lightheadedness with hypotension: YES  Has patient had a PCN reaction causing severe rash involving mucus membranes or skin necrosis: UNKNOWN Has patient had a PCN reaction that required hospitalization:  UNKNOWN Has patient had a PCN reaction occurring within the last 10 years: NO   Aspirin Nausea Only   Ibuprofen Nausea Only   Lyrica [Pregabalin] Nausea Only   Acetaminophen Nausea Only    I personally reviewed active problem list, medication list, allergies with the patient/caregiver today.   ROS  Ten systems reviewed and is negative except as mentioned in HPI    Objective  Vitals:   11/19/23 1418  BP: 126/74  Pulse: 88  Resp: 16  SpO2: 97%  Weight: 154 lb 11.2 oz (70.2 kg)  Height: 5\' 11"  (1.803 m)    Body mass index is 21.58 kg/m.  Physical Exam  Constitutional: Patient appears well-developed and well-nourished.  No distress.  HEENT: head atraumatic, normocephalic, pupils equal and reactive to light, neck supple Cardiovascular: Normal rate, regular rhythm and normal heart sounds.  No murmur heard. No BLE edema. Pulmonary/Chest: Effort normal, scattered rhonchi. No respiratory distress. Abdominal: Soft.  There is no tenderness. Psychiatric: Patient has a normal mood and affect. behavior is normal. Judgment and thought content normal.   Recent Results (from the past 2160 hours)  VAS [place] ABI WITH/WO TBI     Status: None   Collection Time: 09/28/23  3:16 PM  Result Value Ref Range   Right ABI .79    Left ABI .99   Glucose, capillary     Status: Abnormal   Collection Time: 11/05/23  7:16 AM  Result Value Ref Range   Glucose-Capillary 175 (H) 70 - 99 mg/dL    Comment: Glucose reference range applies only to samples taken after fasting for at least 8 hours.  BUN     Status: None   Collection Time: 11/05/23  7:20 AM  Result Value Ref Range   BUN 18 8 - 23 mg/dL    Comment: Performed at [facility], [address] [place], [place] [address]  Creatinine, serum  Status: None   Collection Time: 11/05/23  7:20 AM  Result Value Ref Range   Creatinine, Ser 0.88 0.61 - 1.24 mg/dL   GFR, Estimated >40 >98 mL/min    Comment: (NOTE) Calculated using the  CKD-EPI Creatinine Equation (2021) Performed at [facility], [address] [place], [place] [address]   Glucose, capillary     Status: Abnormal   Collection Time: 11/05/23  8:57 AM  Result Value Ref Range   Glucose-Capillary 180 (H) 70 - 99 mg/dL    Comment: Glucose reference range applies only to samples taken after fasting for at least 8 hours.  POCT glycosylated hemoglobin (Hb A1C)     Status: Abnormal   Collection Time: 11/19/23  2:21 PM  Result Value Ref Range   Hemoglobin A1C 10.4 (A) 4.0 - 5.6 %   HbA1c POC (<> result, manual entry)     HbA1c, POC (prediabetic range)     HbA1c, POC (controlled diabetic range)      Diabetic Foot Exam:     PHQ2/9:    10/04/2023    1:57 PM 07/20/2023    2:08 PM 06/21/2023   10:45 AM 03/21/2023    1:33 PM 02/21/2023    3:31 PM  Depression screen PHQ 2/9  Decreased Interest 0 0 0 0 0  Down, Depressed, Hopeless 0 0 0 0 0  PHQ - 2 Score 0 0 0 0 0  Altered sleeping 0 0 0 0   Tired, decreased energy 0 0 0 0   Change in appetite 0 0 0 0   Feeling bad or failure about yourself  0 0 0 0   Trouble concentrating 0 0 0 0   Moving slowly or fidgety/restless 0 0 0 0   Suicidal thoughts 0 0 0 0   PHQ-9 Score 0 0 0 0   Difficult doing work/chores Not difficult at all  Not difficult at all      phq 9 is negative  Fall Risk:    10/04/2023    1:59 PM 07/20/2023    2:08 PM 06/21/2023   10:45 AM 03/21/2023    1:33 PM 02/21/2023    3:31 PM  Fall Risk   Falls in the past year? 0 0 0 0 0  Number falls in past yr: 0  0  0  Injury with Fall? 0  0  0  Risk for fall due to : No Fall Risks No Fall Risks  No Fall Risks   Follow up Falls prevention discussed;Falls evaluation completed Falls prevention discussed  Falls prevention discussed Falls prevention discussed     Assessment & Plan  1. Type 2 diabetes mellitus with diabetic polyneuropathy, with long-term current use of insulin (HCC) (Primary)  - POCT glycosylated hemoglobin (Hb  A1C) - HM Diabetes Foot Exam - Lipid panel - COMPLETE METABOLIC PANEL WITH GFR  2. Claudication (HCC)  stable  3. Chronic a-fib (HCC)  Rate controlled   4. Peripheral vascular disease (HCC)  - Urine Microalbumin w/creat. ratio - Lipid panel - COMPLETE METABOLIC PANEL WITH GFR  5. Paranoid schizophrenia (HCC)  stable  6. COPD, moderate (HCC)  Stable, doing better on Brezti  7. Chronic hepatitis C without hepatic coma (HCC)  Due to see GI but going to have vascular surgery done first  8. B12 deficiency  Continue B12   9. Dyslipidemia  On statin therapy

## 2023-11-19 NOTE — Patient Instructions (Addendum)
 Go up by 2 units of Tresiba every 3 days if glucose fasting is above 140. Goal is to keep it beween 100-140 .  Continue farxiga Start ozempic

## 2023-11-20 LAB — COMPLETE METABOLIC PANEL WITH GFR
AG Ratio: 2 (calc) (ref 1.0–2.5)
ALT: 17 U/L (ref 9–46)
AST: 17 U/L (ref 10–35)
Albumin: 3.9 g/dL (ref 3.6–5.1)
Alkaline phosphatase (APISO): 88 U/L (ref 35–144)
BUN: 16 mg/dL (ref 7–25)
CO2: 30 mmol/L (ref 20–32)
Calcium: 9.6 mg/dL (ref 8.6–10.3)
Chloride: 99 mmol/L (ref 98–110)
Creat: 0.95 mg/dL (ref 0.70–1.35)
Globulin: 2 g/dL (ref 1.9–3.7)
Glucose, Bld: 237 mg/dL — ABNORMAL HIGH (ref 65–99)
Potassium: 4.4 mmol/L (ref 3.5–5.3)
Sodium: 137 mmol/L (ref 135–146)
Total Bilirubin: 1.3 mg/dL — ABNORMAL HIGH (ref 0.2–1.2)
Total Protein: 5.9 g/dL — ABNORMAL LOW (ref 6.1–8.1)

## 2023-11-20 LAB — LIPID PANEL
Cholesterol: 116 mg/dL (ref <200)
HDL: 58 mg/dL (ref >=40)
LDL Cholesterol (Calc): 42 mg/dL
Non-HDL Cholesterol (Calc): 58 mg/dL (ref <130)
Total CHOL/HDL Ratio: 2 (calc) (ref <5.0)
Triglycerides: 84 mg/dL (ref <150)

## 2023-11-20 LAB — MICROALBUMIN / CREATININE URINE RATIO
Creatinine, Urine: 65 mg/dL (ref 20–320)
Microalb Creat Ratio: 22 mg/g{creat} (ref <30)
Microalb, Ur: 1.4 mg/dL

## 2023-11-20 NOTE — Addendum Note (Signed)
 Addended by: Alba Cory F on: 11/20/2023 12:45 PM   Modules accepted: Level of Service

## 2023-11-26 ENCOUNTER — Ambulatory Visit (INDEPENDENT_AMBULATORY_CARE_PROVIDER_SITE_OTHER): Admitting: Nurse Practitioner

## 2023-11-26 ENCOUNTER — Ambulatory Visit: Admit: 2023-11-26 | Payer: PPO | Admitting: Ophthalmology

## 2023-11-26 SURGERY — PHACOEMULSIFICATION, CATARACT, WITH IOL INSERTION
Anesthesia: Topical | Laterality: Left

## 2023-11-27 ENCOUNTER — Encounter: Payer: Self-pay | Admitting: Family Medicine

## 2023-11-30 ENCOUNTER — Ambulatory Visit: Payer: Self-pay

## 2023-11-30 NOTE — Patient Instructions (Signed)
 Visit Information  Thank you for taking time to visit with me today. Please don't hesitate to contact me if I can be of assistance to you.   Following are the goals we discussed today:   Goals Addressed             This Visit's Progress    management and education of health conditions       Interventions Today    Flowsheet Row Most Recent Value  Chronic Disease   Chronic disease during today's visit Diabetes, Other  [PAD/atherosclerosis]  General Interventions   General Interventions Discussed/Reviewed General Interventions Reviewed, Doctor Visits  [evaluation of current treatment plan for listed heatlh conditions and patients adherence to plan as established by provider. Assessed for HF symptoms, blood sugar readings. +]  Labs Hgb A1c every 3 months  [discussed most recent Hgb A1c results and goal.]  Doctor Visits Discussed/Reviewed Doctor Visits Reviewed  Exercise Interventions   Exercise Discussed/Reviewed Physical Activity  Education Interventions   Education Provided Provided Education  Provided Verbal Education On Blood Sugar Monitoring  Nutrition Interventions   Nutrition Discussed/Reviewed Nutrition Reviewed  [advised to adhere decrease sugar intake and high carbohydrate foods. Advised to adhere to a heart healthy, low carbohydrate diet.]  Pharmacy Interventions   Pharmacy Dicussed/Reviewed Pharmacy Topics Reviewed  [facility] upcoming provider visit.  Advised to keep follow up appointments with providers]              Our next appointment is by telephone on 12/25/23 at 2:30 pm  Please call the care guide team at [phone] if you need to cancel or reschedule your appointment.   If you are experiencing a Mental Health or Behavioral Health Crisis or need someone to talk to, please call the Suicide and Crisis Lifeline: 988 call 1-800-273-TALK (toll free, 24 hour hotline)  The patient verbalized understanding of instructions, educational materials, and care plan  provided today and agreed to receive a mailed copy of patient instructions, educational materials, and care plan.   George Ina RN, BSN, CCM [employer], Population Health Case Manager Phone: [phone]

## 2023-11-30 NOTE — Patient Outreach (Signed)
 Care Coordination   Follow Up Visit Note   11/30/2023 Name: Mark Nash MRN: [id number] DOB: 04-Apr-1956  Mark Nash is a 68 y.o. year old male who sees Alba Cory, MD for primary care. I spoke with  Jeri Cos by phone today.  What matters to the patients health and wellness today?  Patient transferred by RN case manager, Rodney Langton due to practice reassignment.  Patient reports fasting blood sugar was 171 this morning. He states over the last 3 mornings his blood sugar has ranged from 171 to 180. He states he was instructed by his provider to take an extra 2 units of his insulin if blood sugar is >140 for 3 consecutive days. Patient states he uses a regular glucometer for BS monitoring.  Patient reports right leg pain is a 5 today. He states he is only able to tolerate walking for approximately 5 min and then his leg freezes up on him.  He reports having numbing and burning at times in his right leg.  He states the doctor is considering surgery for the leg pain however he has to be cleared by the cardiologist. Per chart review patient has a cardiology visit scheduled for 12/25/23. Patient states he receives follow up calls with the practice pharmacist. He states he is receiving medication assistance for some of his medications.    Goals Addressed             This Visit's Progress    management and education of health conditions       Interventions Today    Flowsheet Row Most Recent Value  Chronic Disease   Chronic disease during today's visit Diabetes, Other  [PAD/atherosclerosis]  General Interventions   General Interventions Discussed/Reviewed General Interventions Reviewed, Doctor Visits  [evaluation of current treatment plan for listed heatlh conditions and patients adherence to plan as established by provider. Assessed for HF symptoms, blood sugar readings. +]  Labs Hgb A1c every 3 months  [discussed most recent Hgb A1c results and goal.]  Doctor Visits  Discussed/Reviewed Doctor Visits Reviewed  Exercise Interventions   Exercise Discussed/Reviewed Physical Activity  Education Interventions   Education Provided Provided Education  Provided Verbal Education On Blood Sugar Monitoring  Nutrition Interventions   Nutrition Discussed/Reviewed Nutrition Reviewed  [advised to adhere decrease sugar intake and high carbohydrate foods. Advised to adhere to a heart healthy, low carbohydrate diet.]  Pharmacy Interventions   Pharmacy Dicussed/Reviewed Pharmacy Topics Reviewed  [facility] upcoming provider visit.  Advised to keep follow up appointments with providers]              SDOH assessments and interventions completed:  Yes  SDOH Interventions Today    Flowsheet Row Most Recent Value  SDOH Interventions   Food Insecurity Interventions Intervention Not Indicated  Housing Interventions Intervention Not Indicated  Transportation Interventions Intervention Not Indicated  Utilities Interventions Intervention Not Indicated        Care Coordination Interventions:  Yes, provided   Follow up plan: Follow up call scheduled for 12/25/23 at 2:30 p m    Encounter Outcome:  Patient Visit Completed   George Ina RN, BSN, CCM [place]  [facility], Population Health Case Manager Phone: [phone]

## 2023-12-18 ENCOUNTER — Other Ambulatory Visit: Payer: Self-pay | Admitting: Cardiovascular Disease

## 2023-12-18 DIAGNOSIS — I482 Chronic atrial fibrillation, unspecified: Secondary | ICD-10-CM

## 2023-12-19 ENCOUNTER — Other Ambulatory Visit: Payer: Self-pay | Admitting: Pharmacist

## 2023-12-19 ENCOUNTER — Telehealth: Payer: Self-pay | Admitting: Pharmacist

## 2023-12-19 DIAGNOSIS — G894 Chronic pain syndrome: Secondary | ICD-10-CM | POA: Diagnosis not present

## 2023-12-19 DIAGNOSIS — E119 Type 2 diabetes mellitus without complications: Secondary | ICD-10-CM | POA: Diagnosis not present

## 2023-12-19 DIAGNOSIS — F1120 Opioid dependence, uncomplicated: Secondary | ICD-10-CM | POA: Diagnosis not present

## 2023-12-19 DIAGNOSIS — I10 Essential (primary) hypertension: Secondary | ICD-10-CM | POA: Diagnosis not present

## 2023-12-19 DIAGNOSIS — F209 Schizophrenia, unspecified: Secondary | ICD-10-CM | POA: Diagnosis not present

## 2023-12-19 DIAGNOSIS — Z720 Tobacco use: Secondary | ICD-10-CM | POA: Diagnosis not present

## 2023-12-19 DIAGNOSIS — Z79891 Long term (current) use of opiate analgesic: Secondary | ICD-10-CM | POA: Diagnosis not present

## 2023-12-19 DIAGNOSIS — M545 Low back pain, unspecified: Secondary | ICD-10-CM | POA: Diagnosis not present

## 2023-12-19 DIAGNOSIS — M5416 Radiculopathy, lumbar region: Secondary | ICD-10-CM | POA: Diagnosis not present

## 2023-12-19 DIAGNOSIS — M5430 Sciatica, unspecified side: Secondary | ICD-10-CM | POA: Diagnosis not present

## 2023-12-19 DIAGNOSIS — Z716 Tobacco abuse counseling: Secondary | ICD-10-CM | POA: Diagnosis not present

## 2023-12-19 DIAGNOSIS — M961 Postlaminectomy syndrome, not elsewhere classified: Secondary | ICD-10-CM | POA: Diagnosis not present

## 2023-12-19 NOTE — Telephone Encounter (Signed)
 Prescription refill request for Eliquis  received. Indication: Afib Last office visit: 06/25/23 Renford Cartwright)  Scr: 0.95 (11/19/23)  Age: 68 Weight: 70.2kg  Appropriate dose. Refill sent.

## 2023-12-19 NOTE — Progress Notes (Signed)
   Outreach Note  12/19/2023 Name: Mark Nash MRN: [id number] DOB: [birth year]  Referred by: Sowles, Krichna, MD  Was unable to reach patient via telephone today and have left HIPAA compliant voicemail asking patient to return my call.   Follow Up Plan: Will collaborate with Care Guide to outreach to schedule follow up with me  Arthur Lash, PharmD, [facility] [phone]

## 2023-12-21 ENCOUNTER — Ambulatory Visit: Admitting: Family Medicine

## 2023-12-22 NOTE — Progress Notes (Unsigned)
 Cardiology Office Note    Date:  12/24/2023   ID:  Mark, Nash 1956/08/25, MRN [id number]  PCP:  Arleen Lacer, MD  Cardiologist:  Belva Boyden, MD  Electrophysiologist:  Boyce Byes, MD   Chief Complaint: Follow up  History of Present Illness:   Mark Nash is a 68 y.o. male with history of nonobstructive CAD by LHC in 01/2023, HFrEF secondary to NICM possibly tachycardia induced, persistent A-fib/flutter diagnosed in 2022, schizoaffective/schizophrenia disorder, PAD, HTN, HLD, DM2, hepatitis C, COPD, and tobacco use who presents for follow-up of his CAD, cardiomyopathy, and A-fib/flutter.   He was diagnosed with A-fib/flutter in 02/2021.  Echo in 03/2021 showed an EF of 55 to 60%, no regional wall motion abnormalities, normal RV systolic function and ventricular cavity size, and trivial mitral regurgitation.  Lexiscan  MPI in 04/2021 showed no significant ischemia with an EF of 59% and was overall low risk.  CT attenuation corrected images showed three-vessel coronary artery calcification.  He was evaluated by EP and initially scheduled for A-fib ablation, though this was canceled due to lower extremity wounds and swelling, and has not been revisited.  Previously failed DCCV.  Echo in 06/2021 showed an EF of 55 to 60%, no regional wall motion abnormalities, normal RV systolic function and ventricular cavity size, mildly dilated left atrium, aortic valve sclerosis without evidence of stenosis, dilated aortic root measuring 36 mm, dilated ascending aorta measuring 37 mm, and an estimated right atrial pressure of 3 mmHg.  Echo in 08/2022 showed an EF of 45 to 50%, global hypokinesis, mildly dilated LV internal cavity size, normal RV systolic function, mild biatrial enlargement, and an estimated right atrial pressure of 15 mmHg.  Rhythm during study was atrial flutter.  Lexiscan  MPI in 12/2022 showed no evidence of significant ischemia and was felt to overall be a low risk MPI.      He was admitted to the hospital in 12/2022 with COPD exacerbation and diagnosed with rhinovirus and possibly pneumonia.  A-fib/flutter with RVR was difficult to control.  Limited echo from 12/2022 showed an EF of 30 to 35% normal RV systolic function and ventricular cavity size, moderately dilated left atrium, and mildly dilated aortic root measuring 41 mm.  R/LHC in 01/2023 showed mildly calcified coronary artery with mild nonobstructive CAD.  RHC showed normal filling pressures, mild pulmonary hypertension, and normal cardiac output.  Medical therapy was recommended for nonischemic cardiomyopathy felt to be tachycardia induced.  Limited echo from 04/2023 showed an EF of 50 to 55%, no regional wall motion abnormalities, normal RV systolic function, ventricular cavity size, and RVSP, mild to moderate tricuspid regurgitation, and an estimated right atrial pressure of 3 mmHg.  He was most recently seen in the office in 05/2023 and was doing well from a cardiac perspective with no changes in form of therapy.  In the setting of underlying PAD with progressive claudication he underwent lower extremity angiography by vascular surgery in 10/2023 with significant bilateral lower extremity PAD with mentation for hybrid procedure including right femoral endarterectomy as well as right popliteal intervention.  He comes in doing well from a cardiac perspective and is without symptoms of angina or cardiac decompensation.  He indicates his breathing continues to improve and he is breathing better than he is now in several years.  No dizziness, presyncope, or syncope.  No palpitations, falls, or symptoms concerning for bleeding.  He does continue to smoke 1 pack daily, this is down from 2 packs daily.  He indicates vascular surgery is planning for further intervention as outlined above, date not yet scheduled.  [facility] Activity Status Index: He is largely sedentary at baseline though can achieve 4 METs throughout the  daily/household activities Revised Cardiac Risk Index: 15% estimated rate of adverse cardiac event in the periprocedural timeframe   Labs independently reviewed: 10/2023 - BUN 16, serum creatinine 0.95, potassium 4.4, albumin 3.9, AST/ALT normal, TC 116, TG 84, HDL 58, LDL 42, A1c 10.4 06/2023 - digoxin  0.5, Hgb 16.1, PLT 158 01/2023 - TSH normal  Past Medical History:  Diagnosis Date   Arthritis    Asthma    Benign neoplasm of ascending colon    polyps   Constipation    COPD (chronic obstructive pulmonary disease) (HCC)    NO INHALER   Diabetes mellitus without complication (HCC)    Type 2   Diverticulosis    Dyspnea    RARE-WITH EXERTION DUE TO COPD   GERD (gastroesophageal reflux disease)    Hepatitis C    History of kidney stones    H/O   Hyperlipidemia    Hypertension 06/10/2018   currently not under control   Left inguinal hernia    Peripheral vascular disease (HCC)    Restless legs    Schizoaffective disorder (HCC)    Pt denies   Thrombocytopenia (HCC)    Vitamin D  deficiency    Wears dentures    full upper and lower    Past Surgical History:  Procedure Laterality Date   BACK SURGERY     CARDIOVERSION N/A 05/20/2021   Procedure: CARDIOVERSION;  Surgeon: Wenona Hamilton, MD;  Location: [facility];  Service: Cardiovascular;  Laterality: N/A;   CATARACT EXTRACTION W/PHACO Right 10/31/2021   Procedure: CATARACT EXTRACTION PHACO AND INTRAOCULAR LENS PLACEMENT (IOC) RIGHT DIABETIC;  Surgeon: Rosa College, MD;  Location: [facility];  Service: Ophthalmology;  Laterality: Right;  Diabetic 4.34 00:42.8   COLONOSCOPY WITH PROPOFOL  N/A 09/07/2017   Procedure: COLONOSCOPY WITH PROPOFOL ;  Surgeon: Irby Mannan, MD;  Location: [facility];  Service: Endoscopy;  Laterality: N/A;   ESOPHAGOGASTRODUODENOSCOPY (EGD) WITH PROPOFOL  N/A 12/07/2017   Procedure: ESOPHAGOGASTRODUODENOSCOPY (EGD) WITH PROPOFOL ;  Surgeon: Irby Mannan, MD;  Location:  [facility];  Service: Endoscopy;  Laterality: N/A;   EYE SURGERY     FLEXIBLE SIGMOIDOSCOPY N/A 11/09/2017   Procedure: FLEXIBLE SIGMOIDOSCOPY;  Surgeon: Irby Mannan, MD;  Location: [facility];  Service: Endoscopy;  Laterality: N/A;   HERNIA REPAIR     inguinal   INGUINAL HERNIA REPAIR Bilateral 06/21/2018   Procedure: LAPAROSCOPIC BILATERAL INGUINAL HERNIA REPAIR;  Surgeon: Franki Isles, MD;  Location: [facility];  Service: General;  Laterality: Bilateral;   L3 TO L5 LAMINECTOMY FOR DECOMPRESSION  07/17/2016   [facility]   LOWER EXTREMITY ANGIOGRAPHY Right 11/05/2023   Procedure: Lower Extremity Angiography;  Surgeon: Celso College, MD;  Location: [facility];  Service: Cardiovascular;  Laterality: Right;   REPAIR OF CEREBROSPINAL FLUID LEAK N/A 09/08/2016   Procedure: Lumbar wound exploration, repair of pseudomenigocele;  Surgeon: Raelene Bullocks Ditty, MD;  Location: [facility];  Service: Neurosurgery;  Laterality: N/A;  Lumbar wound exploration, repair of pseudomenigocele   RIGHT/LEFT HEART CATH AND CORONARY ANGIOGRAPHY N/A 01/29/2023   Procedure: RIGHT/LEFT HEART CATH AND CORONARY ANGIOGRAPHY;  Surgeon: Wenona Hamilton, MD;  Location: [facility];  Service: Cardiovascular;  Laterality: N/A;   SINUSOTOMY      Current Medications: Current  Meds  Medication Sig   apixaban  (ELIQUIS ) 5 MG TABS tablet Take 1 tablet by mouth twice daily   blood glucose meter kit and supplies KIT Dispense based on patient and insurance preference. Use up to tid E11.65, LON:99   Blood Glucose Monitoring Suppl (ONETOUCH VERIO FLEX SYSTEM) w/Device KIT Use to check blood sugar up to four times daily as directed   Budeson-Glycopyrrol-Formoterol  (BREZTRI  AEROSPHERE) 160-9-4.8 MCG/ACT AERO Inhale 2 puffs into the lungs 2 (two) times daily. Patient receives via [employer] Patient Assistance.   dapagliflozin  propanediol (FARXIGA ) 10 MG TABS tablet Take 1 tablet (10 mg  total) by mouth daily.   digoxin  (LANOXIN ) 0.125 MG tablet Take 0.5 tablets (0.0625 mg total) by mouth daily.   gabapentin  (NEURONTIN ) 300 MG capsule Take 300-600 mg by mouth See admin instructions. Take 1 capsule (300 mg) by mouth in the morning, take 1 capsule (300 mg) by mouth at noon, and 2 capsules (600 mg) by mouth in the evening   glucose blood (ONETOUCH VERIO) test strip Use to check blood sugar up to four times daily as directed   Insulin  Degludec (TRESIBA ) 100 UNIT/ML SOLN Inject 8 Units into the skin daily at 12 noon.   Lancet Devices MISC 1 each by Does not apply route 3 (three) times daily as needed. What ins will cover dx:E11.65, LON:99 tid   Lancets (ONETOUCH DELICA PLUS LANCET30G) MISC Use to check blood sugar up to four times daily as directed   losartan  (COZAAR ) 25 MG tablet Take 0.5 tablets (12.5 mg total) by mouth daily. Skip the dose if systolic BP less than 110 mmHg   metoprolol  succinate (TOPROL -XL) 200 MG 24 hr tablet Take 1 tablet (200 mg total) by mouth daily. Take with or immediately following a meal.   omeprazole  (PRILOSEC) 20 MG capsule Take 20 mg by mouth 3 (three) times daily.   potassium chloride  SA (KLOR-CON  M) 20 MEQ tablet Take 1 tablet (20 mEq total) by mouth daily.   pravastatin  (PRAVACHOL ) 20 MG tablet Take 1 tablet (20 mg total) by mouth daily with supper.   Semaglutide ,0.25 or 0.5MG /DOS, (OZEMPIC , 0.25 OR 0.5 MG/DOSE,) 2 MG/1.5ML SOPN Inject 0.25 mg into the skin once a week.   tiZANidine (ZANAFLEX) 4 MG tablet Take 4 mg by mouth every 12 (twelve) hours.   torsemide  (DEMADEX ) 20 MG tablet Take 1 tablet (20 mg total) by mouth daily before breakfast. With potassium pill and hold losartan  when you take this medication   traMADol  (ULTRAM ) 50 MG tablet Take 50 mg by mouth every 12 (twelve) hours.   triamcinolone  cream (KENALOG ) 0.1 % Apply topically 2 (two) times daily.   Vitamin D , Ergocalciferol , (DRISDOL ) 1.25 MG (50000 UNIT) CAPS capsule TAKE 1 CAPSULE BY MOUTH  EVERY 14 DAYS    Allergies:   Penicillins, Aspirin , Ibuprofen, Lyrica  [pregabalin ], and Acetaminophen    Social History   Socioeconomic History   Marital status: Divorced    Spouse name: Not on file   Number of children: 2   Years of education: Not on file   Highest education level: 12th grade  Occupational History   Occupation: Disabled  Tobacco Use   Smoking status: Every Day    Current packs/day: 1.50    Average packs/day: 1.5 packs/day for 46.0 years (69.0 ttl pk-yrs)    Types: Cigarettes   Smokeless tobacco: Never  Vaping Use   Vaping status: Never Used  Substance and Sexual Activity   Alcohol use: No    Alcohol/week: 0.0 standard drinks of  alcohol   Drug use: No   Sexual activity: Not Currently  Other Topics Concern   Not on file  Social History Narrative   Lives alone   Social Drivers of Health   Financial Resource Strain: Low Risk  (10/08/2023)   Received from [facility]   Overall Financial Resource Strain (CARDIA)    Difficulty of Paying Living Expenses: Not very hard  Food Insecurity: No Food Insecurity (11/30/2023)   Hunger Vital Sign    Worried About Running Out of Food in the Last Year: Never true    Ran Out of Food in the Last Year: Never true  Transportation Needs: No Transportation Needs (11/30/2023)   PRAPARE - Administrator, Civil Service (Medical): No    Lack of Transportation (Non-Medical): No  Physical Activity: Insufficiently Active (10/04/2023)   Exercise Vital Sign    Days of Exercise per Week: 1 day    Minutes of Exercise per Session: 10 min  Stress: No Stress Concern Present (10/04/2023)   [employer] of Occupational Health - Occupational Stress Questionnaire    Feeling of Stress : Not at all  Social Connections: Socially Isolated (10/04/2023)   Social Connection and Isolation Panel [NHANES]    Frequency of Communication with Friends and Family: Once a week    Frequency of Social Gatherings with Friends and  Family: Never    Attends Religious Services: Never    Database administrator or Organizations: No    Attends Engineer, structural: Never    Marital Status: Divorced     Family History:  The patient's family history includes Colitis in his mother; Diabetes in his father and sister; Diverticulitis in his brother and mother; Heart disease in his father; Heart failure in his brother and mother; Hypertension in his father and mother.  ROS:   12-point review of systems is negative unless otherwise noted in the HPI.   EKGs/Labs/Other Studies Reviewed:    Studies reviewed were summarized above. The additional studies were reviewed today:  Limited echo 05/09/2023: 1. Left ventricular ejection fraction, by estimation, is 50 to 55%. The  left ventricle has low normal function. The left ventricle has no regional  wall motion abnormalities. Left ventricular diastolic parameters are  indeterminate.   2. Right ventricular systolic function is normal. The right ventricular  size is normal. There is normal pulmonary artery systolic pressure. The  estimated right ventricular systolic pressure is 21.6 mmHg.   3. The mitral valve is normal in structure. No evidence of mitral valve  regurgitation. No evidence of mitral stenosis.   4. Tricuspid valve regurgitation is mild to moderate.   5. The aortic valve is normal in structure. Aortic valve regurgitation is  not visualized. No aortic stenosis is present.   6. The inferior vena cava is normal in size with greater than 50%  respiratory variability, suggesting right atrial pressure of 3 mmHg.  __________  [facility] 01/29/2023: 1.  Mildly calcified coronary arteries with mild nonobstructive coronary artery disease. 2.  Left ventricular angiography was not performed.  EF was moderately reduced by echo. 3.  Right heart catheterization showed normal filling pressures, mild pulmonary hypertension and normal cardiac output.   Recommendations: The  patient has nonischemic cardiomyopathy likely tachycardia induced.  Recommend medical therapy. Continue Toprol  for anticoagulation. Resume heparin  2 hours after sheath pull and likely can transition to Eliquis  later this evening. __________   Limited echo 01/26/2023: 1. Left ventricular ejection fraction, by estimation, is  30 to 35%. The  left ventricle has moderately decreased function. Left ventricular  endocardial border not optimally defined to evaluate regional wall motion.   2. Right ventricular systolic function is normal. The right ventricular  size is normal.   3. Left atrial size was moderately dilated.   4. The mitral valve is normal in structure. No evidence of mitral valve  regurgitation. No evidence of mitral stenosis.   5. The aortic valve is calcified. Aortic valve regurgitation is not  visualized. No aortic stenosis is present.   6. Aortic dilatation noted. There is mild dilatation of the aortic root,  measuring 41 mm.   7. Challenging image quality. Most valves not well seen.  __________   Lexiscan  MPI 01/01/2023:   Sensitivity and specificity of the study are significant reduced due to artifact.  The resting images are uninterpretable due to severe extracardiac activity.   Abnormal, probably low risk pharmacologic myocardial.   There is a small in size, mild in severity, fixed apical anterior and apical defect most consistent with artifact but cannot rule out an element of scar.   There is no evidence of significant ischemia.   Left ventricular systolic function is moderately reduced (LVEF 35-45%).   Coronary artery calcification and aortic atherosclerosis are present.   No significant change from prior study on 05/12/2021 allowing for differences in artifact. __________   2D echo 09/11/2022: 1. Left ventricular ejection fraction, by estimation, is 45 to 50%. The  left ventricle has mildly decreased function. The left ventricle  demonstrates global hypokinesis. The  left ventricular internal cavity size  was mildly dilated. Left ventricular  diastolic parameters are indeterminate.   2. Right ventricular systolic function is normal. The right ventricular  size is not well visualized.   3. Left atrial size was mildly dilated.   4. Right atrial size was mildly dilated.   5. The mitral valve is grossly normal. No evidence of mitral valve  regurgitation.   6. The aortic valve is calcified. Aortic valve regurgitation is not  visualized.   7. The inferior vena cava is dilated in size with <50% respiratory  variability, suggesting right atrial pressure of 15 mmHg.   8. Rhythm strip during this exam demonstrates atrial flutter.   Comparison(s): 06/28/21 EF 55-60%.  __________   2D echo 06/28/2021: 1. Left ventricular ejection fraction, by estimation, is 55 to 60%. The  left ventricle has normal function. The left ventricle has no regional  wall motion abnormalities. Left ventricular diastolic parameters are  indeterminate.   2. Right ventricular systolic function is normal. The right ventricular  size is normal.   3. Left atrial size was mildly dilated.   4. The mitral valve was not well visualized. No evidence of mitral valve  regurgitation. No evidence of mitral stenosis.   5. The aortic valve was not well visualized. Aortic valve regurgitation  is not visualized. Mild to moderate aortic valve sclerosis/calcification  is present, without any evidence of aortic stenosis.   6. There is dilatation of the ascending aorta, measuring 37 mm. There is  dilatation of the aortic root, measuring 36 mm.   7. The inferior vena cava is normal in size with greater than 50%  respiratory variability, suggesting right atrial pressure of 3 mmHg.   Comparison(s): EF 55%, technically challenging due to limited acoustic  windows.  __________   Lexiscan  MPI 05/12/2021: Pharmacological myocardial perfusion imaging study with no significant  ischemia Attenuation  corrected images with small fixed defect in  the apical region, possible artifact though unable to exclude small scar This small fixed defect not noted on nonattenuation corrected images.  Normal wall motion, EF estimated at 59% Resting EKG with atrial fibrillation No EKG changes concerning for ischemia at peak stress or in recovery. CT attenuation correction images with moderate three-vessel coronary calcification Low risk scan __________   2D echo 04/20/2021 Ivette Marks): 1. Normal LVF.   2. Left ventricular ejection fraction, by estimation, is 55 to 60%. Left  ventricular ejection fraction by PLAX is 55 %. The left ventricle has  normal function. The left ventricle has no regional wall motion  abnormalities. Left ventricular diastolic  function could not be evaluated.   3. Right ventricular systolic function is normal. The right ventricular  size is normal.   4. The mitral valve is grossly normal. Trivial mitral valve  regurgitation.   5. The aortic valve is normal in structure. Aortic valve regurgitation is  not visualized.    EKG:  EKG is ordered today.  The EKG ordered today demonstrates atrial flutter with variable AV block, 88 bpm, nonspecific ST-T changes  Recent Labs: 01/21/2023: B Natriuretic Peptide 19.7 01/27/2023: TSH 1.455 02/07/2023: Magnesium  2.2 07/04/2023: Hemoglobin 16.1; Platelets 158 11/19/2023: ALT 17; BUN 16; Creat 0.95; Potassium 4.4; Sodium 137  Recent Lipid Panel    Component Value Date/Time   CHOL 116 11/19/2023 1453   CHOL 112 07/13/2015 1018   TRIG 84 11/19/2023 1453   HDL 58 11/19/2023 1453   HDL 62 07/13/2015 1018   CHOLHDL 2.0 11/19/2023 1453   VLDL 12 12/15/2016 1405   LDLCALC 42 11/19/2023 1453    PHYSICAL EXAM:    VS:  BP (!) 140/76   Pulse 88   Ht 5\' 11"  (1.803 m)   Wt 149 lb 9.6 oz (67.9 kg)   SpO2 97%   BMI 20.86 kg/m   BMI: Body mass index is 20.86 kg/m.  Physical Exam  Wt Readings from Last 3 Encounters:  12/24/23 149 lb 9.6 oz  (67.9 kg)  11/19/23 154 lb 11.2 oz (70.2 kg)  11/05/23 154 lb (69.9 kg)     ASSESSMENT & PLAN:   Nonobstructive CAD: He is without symptoms of angina or cardiac decompensation.  Continue aggressive risk factor modification and current pharmacotherapy including apixaban  and lieu of aspirin  given underlying A-fib/flutter, Toprol -XL, and pravastatin .  No indication for further ischemic testing at this time.  HFimpEF secondary to NICM: He is euvolemic and well compensated.  Continue current pharmacotherapy including Toprol -XL 200 mg daily, losartan  12.5 mg, Farxiga  10 mg, digoxin  0.0625 mg, and torsemide  20 mg.  Defer transition from ARB to ARNI in the setting of prior relative hypotension and normalization of LV systolic function.  However, should he redevelop cardiomyopathy would look to further escalate GDMT with his transition at that time.  Persistent Afib/flutter: Currently in rate controlled atrial flutter with continuation of Toprol -XL and digoxin  as outlined above.  Check BMP, CBC, and digoxin  level.  CHA2DS2-VASc at least 5 (CHF, HTN, age x 1, DM, vascular disease).  Continue apixaban  5 mg twice daily.  No falls or symptoms concerning for bleeding.  HTN: Blood pressure is mildly elevated in the office, though typically well-controlled.  Continue pharmacotherapy as outlined above.  HLD: LDL 42 in 10/2023.  He remains on pravastatin  20 mg.  Followed by PCP.  PAD: Management per vascular surgery.  Should further intervention be pursued there is an estimated rate of 15% for adverse cardiac event in the periprocedural timeframe.  Given he is without symptoms of angina or cardiac decompensation, and in the context of cardiac cath 10 months ago showing nonobstructive disease and most recent echo showing improvement in LV systolic function, no further cardiac testing will further mitigate this risk.  Apixaban  should be held for 2 days prior to noncardiac procedure with resumption as soon as safely  possible in the postoperative timeframe at the discretion of his surgeon.  Tobacco use: Complete cessation is recommended.     Disposition: F/u with Dr. Goillan or an APP in 6 months, and EP as directed.    Medication Adjustments/Labs and Tests Ordered: Current medicines are reviewed at length with the patient today.  Concerns regarding medicines are outlined above. Medication changes, Labs and Tests ordered today are summarized above and listed in the Patient Instructions accessible in Encounters.   Signed, Varney Gentleman, PA-C 12/24/2023 4:35 PM     [facility] [place] [address] [place], [place] [address] [phone]

## 2023-12-24 ENCOUNTER — Encounter: Payer: Self-pay | Admitting: Physician Assistant

## 2023-12-24 ENCOUNTER — Ambulatory Visit: Payer: PPO | Attending: Physician Assistant | Admitting: Physician Assistant

## 2023-12-24 VITALS — BP 140/76 | HR 88 | Ht 71.0 in | Wt 149.6 lb

## 2023-12-24 DIAGNOSIS — I739 Peripheral vascular disease, unspecified: Secondary | ICD-10-CM | POA: Diagnosis not present

## 2023-12-24 DIAGNOSIS — I4819 Other persistent atrial fibrillation: Secondary | ICD-10-CM

## 2023-12-24 DIAGNOSIS — I10 Essential (primary) hypertension: Secondary | ICD-10-CM | POA: Diagnosis not present

## 2023-12-24 DIAGNOSIS — I5032 Chronic diastolic (congestive) heart failure: Secondary | ICD-10-CM

## 2023-12-24 DIAGNOSIS — I2510 Atherosclerotic heart disease of native coronary artery without angina pectoris: Secondary | ICD-10-CM | POA: Diagnosis not present

## 2023-12-24 DIAGNOSIS — Z720 Tobacco use: Secondary | ICD-10-CM

## 2023-12-24 DIAGNOSIS — E785 Hyperlipidemia, unspecified: Secondary | ICD-10-CM

## 2023-12-24 DIAGNOSIS — Z79899 Other long term (current) drug therapy: Secondary | ICD-10-CM

## 2023-12-24 DIAGNOSIS — I428 Other cardiomyopathies: Secondary | ICD-10-CM

## 2023-12-24 NOTE — Patient Instructions (Signed)
 Medication Instructions:  Your Physician recommend you continue on your current medication as directed.    *If you need a refill on your cardiac medications before your next appointment, please call your pharmacy*  Lab Work: Your provider would like for you to have following labs drawn today BMeT, CBC, and Digoxin  level.   If you have labs (blood work) drawn today and your tests are completely normal, you will receive your results only by: MyChart Message (if you have MyChart) OR A paper copy in the mail If you have any lab test that is abnormal or we need to change your treatment, we will call you to review the results.  Follow-Up: At [facility], you and your health needs are our priority.  As part of our continuing mission to provide you with exceptional heart care, our providers are all part of one team.  This team includes your primary Cardiologist (physician) and Advanced Practice Providers or APPs (Physician Assistants and Nurse Practitioners) who all work together to provide you with the care you need, when you need it.  Your next appointment:   6 month(s)  Provider:   You may see Timothy Gollan, MD or one of the following Advanced Practice Providers on your designated Care Team:   Laneta Pintos, NP Gildardo Labrador, PA-C Varney Gentleman, PA-C Cadence Beverly, PA-C Ronald Cockayne, NP Morey Ar, NP   We recommend signing up for the patient portal called "MyChart".  Sign up information is provided on this After Visit Summary.  MyChart is used to connect with patients for Virtual Visits (Telemedicine).  Patients are able to view lab/test results, encounter notes, upcoming appointments, etc.  Non-urgent messages can be sent to your provider as well.   To learn more about what you can do with MyChart, go to [email or web address].   Other Instructions: PLEASE hold your Eliquis  2 days prior your procedure with Dr Vonna Guardian, then [facility] when he says to resume taking medicine.   Also ask him about holding your Ozempic .

## 2023-12-25 ENCOUNTER — Other Ambulatory Visit: Payer: Self-pay

## 2023-12-25 ENCOUNTER — Ambulatory Visit: Payer: Self-pay

## 2023-12-25 LAB — BASIC METABOLIC PANEL WITH GFR
BUN/Creatinine Ratio: 14 (ref 10–24)
BUN: 14 mg/dL (ref 8–27)
CO2: 27 mmol/L (ref 20–29)
Calcium: 9.7 mg/dL (ref 8.6–10.2)
Chloride: 101 mmol/L (ref 96–106)
Creatinine, Ser: 1 mg/dL (ref 0.76–1.27)
Glucose: 94 mg/dL (ref 70–99)
Potassium: 4.9 mmol/L (ref 3.5–5.2)
Sodium: 139 mmol/L (ref 134–144)
eGFR: 82 mL/min/{1.73_m2} (ref >59)

## 2023-12-25 LAB — CBC
Hematocrit: 50.8 % (ref 37.5–51.0)
Hemoglobin: 16.7 g/dL (ref 13.0–17.7)
MCH: 29.9 pg (ref 26.6–33.0)
MCHC: 32.9 g/dL (ref 31.5–35.7)
MCV: 91 fL (ref 79–97)
Platelets: 171 10*3/uL (ref 150–450)
RBC: 5.58 x10E6/uL (ref 4.14–5.80)
RDW: 14 % (ref 11.6–15.4)
WBC: 6.6 10*3/uL (ref 3.4–10.8)

## 2023-12-25 LAB — DIGOXIN LEVEL: Digoxin, Serum: 0.7 ng/mL (ref 0.5–0.9)

## 2023-12-25 NOTE — Patient Outreach (Signed)
 Complex Care Management   Visit Note  12/25/2023  Name:  Mark Nash MRN: [id number] DOB: Jan 18, 1956  Situation: Referral received for Complex Care Management related to  diabetes/ atheriosclerosis  I obtained verbal consent from Patient.  Visit completed with patient  on the phone  Background:   Past Medical History:  Diagnosis Date   Arthritis    Asthma    Benign neoplasm of ascending colon    polyps   Constipation    COPD (chronic obstructive pulmonary disease) (HCC)    NO INHALER   Diabetes mellitus without complication (HCC)    Type 2   Diverticulosis    Dyspnea    RARE-WITH EXERTION DUE TO COPD   GERD (gastroesophageal reflux disease)    Hepatitis C    History of kidney stones    H/O   Hyperlipidemia    Hypertension 06/10/2018   currently not under control   Left inguinal hernia    Peripheral vascular disease (HCC)    Restless legs    Schizoaffective disorder (HCC)    Pt denies   Thrombocytopenia (HCC)    Vitamin D  deficiency    Wears dentures    full upper and lower    Assessment: Patient Reported Symptoms:  Cognitive Cognitive Status: Alert and oriented to person, place, and time, Insightful and able to interpret abstract concepts, Normal speech and language skills Cognitive/Intellectual Conditions Management [RPT]: None reported or documented in medical history or problem list   Health Maintenance Behaviors: Annual physical exam, Immunizations Healing Pattern: Average Health Facilitated by: Pain control, Rest  Neurological Neurological Review of Symptoms: No symptoms reported    HEENT HEENT Symptoms Reported: No symptoms reported HEENT Conditions: Vision problem(s) Vision Problems: glaucoma HEENT Management Strategies: Routine screening HEENT Self-Management Outcome: 4 (good) HEENT Comment: patient reports having glaucoma in left eye.  He states he was scheduled to have surgery due to the glaucoma however had to cancel due to possible surgery on  right leg. Vision problem(s)  Cardiovascular Cardiovascular Symptoms Reported: No symptoms reported, Other: Other Cardiovascular Symptoms: right leg pain Does patient have uncontrolled Hypertension?: No Cardiovascular Conditions: Heart failure, Hypertension (atrial fibrillation, atheriosclerosis with intermittent claudication  of right lower extremity) Cardiovascular Management Strategies: Routine screening, Medication therapy, Diet modification Weight: 149 lb (67.6 kg) Cardiovascular Self-Management Outcome: 4 (good) Cardiovascular Comment: Per chart review patient with atheriosclerosis with intermittent claudication  of right lower extremity.  Patient states he has been recently cleared by his cardiologist to have an angiography with possible revascularization as proposed by the vascular surgeon.  Patient states he is waiting to hear back from the vascular office regarding possible surgery date.  Respiratory Respiratory Symptoms Reported: No symptoms reported Respiratory Conditions: COPD Respiratory Self-Management Outcome: 4 (good) Respiratory Comment: patient states COPD is managed by routine follow up with provider and medication.  Endocrine Patient reports the following symptoms related to hypoglycemia or hyperglycemia : No symptoms reported Is patient diabetic?: Yes Is patient checking blood sugars at home?: Yes Endocrine Conditions: Diabetes Endocrine Management Strategies: Diet modification, Routine screening, Medication therapy, Medical device Endocrine Self-Management Outcome: 4 (good)  Gastrointestinal Gastrointestinal Symptoms Reported: Nausea Gastrointestinal Conditions: Nausea, Reflux/heartburn Gastrointestinal Management Strategies: Medication therapy (Patient denies any management strategies for the nausea.  He states nausea usually goes away in a short time.) Gastrointestinal Self-Management Outcome: 4 (good) Gastrointestinal Comment: patient states he feels the occasional  nausea is from taking the Ozempic . Nutrition Risk Screen (CP): No indicators present  Genitourinary Genitourinary Symptoms Reported: No  symptoms reported    Integumentary Integumentary Symptoms Reported: No symptoms reported    Musculoskeletal Musculoskelatal Symptoms Reviewed: No symptoms reported        Psychosocial Psychosocial Symptoms Reported: No symptoms reported     Quality of Family Relationships: supportive Do you feel physically threatened by others?: No      12/25/2023    4:31 PM  Depression screen PHQ 2/9  Decreased Interest 0  Down, Depressed, Hopeless 0  PHQ - 2 Score 0    Vitals:   12/25/23 1505 12/25/23 1538  BP: 130/70 130/70    Medications Reviewed Today     Reviewed by Consetta Cosner E, RN (Registered Nurse) on 12/25/23 at 1541  Med List Status: <None>   Medication Order Taking? Sig Documenting Provider Last Dose Status Informant  apixaban  (ELIQUIS ) 5 MG TABS tablet [id number] Yes Take 1 tablet by mouth twice daily Gollan, Timothy J, MD Taking Active   blood glucose meter kit and supplies KIT [id number]  Dispense based on patient and insurance preference. Use up to tid E11.65, LON:99 Sowles, Krichna, MD  Active Self  Blood Glucose Monitoring Suppl (ONETOUCH VERIO FLEX SYSTEM) w/Device KIT [id number]  Use to check blood sugar up to four times daily as directed Sowles, Krichna, MD  Active   Budeson-Glycopyrrol-Formoterol  (BREZTRI  AEROSPHERE) 160-9-4.8 MCG/ACT AERO [id number] Yes Inhale 2 puffs into the lungs 2 (two) times daily. Patient receives via [employer] Patient Assistance. Sowles, Krichna, MD Taking Active Self  dapagliflozin  propanediol (FARXIGA ) 10 MG TABS tablet [id number] Yes Take 1 tablet (10 mg total) by mouth daily. Sowles, Krichna, MD Taking Active Self  digoxin  (LANOXIN ) 0.125 MG tablet [id number] Yes Take 0.5 tablets (0.0625 mg total) by mouth daily. Roark Chick, PA-C Taking Active   gabapentin  (NEURONTIN ) 300 MG capsule [id number] Yes Take 300-600 mg by  mouth See admin instructions. Take 1 capsule (300 mg) by mouth in the morning, take 1 capsule (300 mg) by mouth at noon, and 2 capsules (600 mg) by mouth in the evening [provider] Taking Active Self  glucose blood (ONETOUCH VERIO) test strip [id number]  Use to check blood sugar up to four times daily as directed Sowles, Krichna, MD  Active   Insulin  Degludec (TRESIBA ) 100 UNIT/ML SOLN [id number] Yes Inject 8 Units into the skin daily at 12 noon. Sowles, Krichna, MD Taking Active            Med Note Marrie Sizer, Adalei Novell E   Tue Dec 25, 2023  3:41 PM) Patient states provider request he also Go up by 2 units of Tresiba  every 3 days if glucose fasting is above 140  Lancet Devices MISC [id number]  1 each by Does not apply route 3 (three) times daily as needed. What ins will cover dx:E11.65, LON:99 tid Rockney Cid, DO  Active Self  Lancets [facility] PL[facility] [place]) MISC [id number]  Use to check blood sugar up to four times daily as directed Sowles, Krichna, MD  Active   losartan  (COZAAR ) 25 MG tablet [id number] Yes Take 0.5 tablets (12.5 mg total) by mouth daily. Skip the dose if systolic BP less than 110 mmHg West, Katlyn D, NP Taking Active   metoprolol  succinate (TOPROL -XL) 200 MG 24 hr tablet [id number] Yes Take 1 tablet (200 mg total) by mouth daily. Take with or immediately following a meal. Althia Atlas, MD Taking Active   omeprazole  (PRILOSEC) 20 MG capsule [id number] Yes Take 20 mg by mouth 3 (three) times daily. [provider] Taking Active  potassium chloride  SA (KLOR-CON  M) 20 MEQ tablet [id number] Yes Take 1 tablet (20 mEq total) by mouth daily. Sowles, Krichna, MD Taking Active   pravastatin  (PRAVACHOL ) 20 MG tablet [id number] Yes Take 1 tablet (20 mg total) by mouth daily with supper. Sowles, Krichna, MD Taking Active   Semaglutide ,0.25 or 0.5MG /DOS, (OZEMPIC , 0.25 OR 0.5 MG/DOSE,) 2 MG/1.5ML SOPN [id number] Yes Inject 0.25 mg into the skin once a week. Sowles, Krichna,  MD Taking Active   tiZANidine (ZANAFLEX) 4 MG tablet [id number] Yes Take 4 mg by mouth every 12 (twelve) hours. Cordie Deters, MD Taking Active Self           Med Note Elissa Guise   Wed Feb 07, 2023  2:38 PM)    torsemide  (DEMADEX ) 20 MG tablet [id number] Yes Take 1 tablet (20 mg total) by mouth daily before breakfast. With potassium pill and hold losartan  when you take this medication Sowles, Krichna, MD Taking Active   traMADol  (ULTRAM ) 50 MG tablet [id number] Yes Take 50 mg by mouth every 12 (twelve) hours. Cordie Deters, MD Taking Active Self  triamcinolone  cream (KENALOG ) 0.1 % [id number] Yes Apply topically 2 (two) times daily. [provider] Taking Active   Vitamin D , Ergocalciferol , (DRISDOL ) 1.25 MG (50000 UNIT) CAPS capsule [id number] Yes TAKE 1 CAPSULE BY MOUTH EVERY 14 DAYS Sowles, Krichna, MD Taking Active   Med List Note Huston Maiers, RN 10/03/18 1438): UDS 10/03/18            Recommendation:   PCP Follow-up  Follow Up Plan:   Telephone follow-up in 1 month with RN case manager  Verba Girt RN, BSN, CCM [place]  [facility], Population Health Case Manager Phone: [phone]

## 2023-12-25 NOTE — Patient Instructions (Signed)
 Visit Information  Thank you for taking time to visit with me today. Please don't hesitate to contact me if I can be of assistance to you before our next scheduled appointment.  Our next appointment is by telephone on 01/24/24 at 2:45 pm Please call the care guide team at [phone] if you need to cancel or reschedule your appointment.   Following is a copy of your care plan:   Goals Addressed             This Visit's Progress    COMPLETED: Management of chronic medical conditions       Interventions Today    Flowsheet Row Most Recent Value  Chronic Disease   Chronic disease during today's visit Other, Diabetes, Hypertension (HTN)  [PAD]  General Interventions   General Interventions Discussed/Reviewed General Interventions Reviewed, Doctor Visits, Durable Medical Equipment (DME), Labs  Labs Hgb A1c every 3 months  [A1C remains elevated at 10.5, aware of goal less that 7]  Doctor Visits Discussed/Reviewed Doctor Visits Reviewed, Specialist  [Upcoming reviewed: PCP 3/24, Vascular 3/31, Pharmacist 4/23, and cardiology 4/28]  Durable Medical Equipment (DME) Glucomoter, BP Cuff  [Confirms he picked up new glucometer, advised to start monitoring blood sugars and blood pressure daily]  Exercise Interventions   Exercise Discussed/Reviewed Physical Activity  Physical Activity Discussed/Reviewed Physical Activity Reviewed  [Encouraged to increase activity as much as possible to help stay in best shape before surgery]  Education Interventions   Education Provided Provided Education  Provided Verbal Education On Blood Sugar Monitoring, Labs, Medication, Nutrition, When to see the doctor, Exercise  [Meds reviewed, denies any changes. Educated on the importance of smoking cessation in relation to management of PAD. Advised on how controlled DM will help with surgery recovery]  Labs Reviewed Hgb A1c  Nutrition Interventions   Nutrition Discussed/Reviewed Nutrition Reviewed, Adding fruits and  vegetables, Carbohydrate meal planning, Decreasing sugar intake  [Discussed appropriate foods to eat in order to keep blood sugar under control]  Pharmacy Interventions   Pharmacy Dicussed/Reviewed Pharmacy Topics Reviewed, Affording Medications, Medication Adherence  Medication Adherence Not taking medication  [Working with pharmacy team on medication management and chronic disease management. Some meds (example Ozempic ) were ordered but unable to take until after surgery for his PAD.]           VBCI RN Care Plan- atheriosclerosis LE       Problems:  Chronic Disease Management support and education needs related to atherosclerosis of  right lower extremity  Goal: Over the next 3 months the Patient will attend all scheduled medical appointments: with providers as evidenced by patient report/ chart review.         continue to work with Medical illustrator and/or Social Worker to address care management and care coordination needs related to atheriosclerosis as evidenced by adherence to care management team scheduled appointments     demonstrate Ongoing adherence to prescribed treatment plan for atheriosclerosis as evidenced by patient report /chart review.  take all medications exactly as prescribed and will call provider for medication related questions as evidenced by patient report /chart review.      Interventions:   Evaluation of current treatment plan related to mentioned health condition self-management and patient's adherence to plan as established by provider. Discussed plans with patient for ongoing care management follow up and provided patient with direct contact information for care management team Reviewed medications with patient and discussed importance of compliance Reviewed scheduled/upcoming provider appointments including   Assessed social determinant  of health barriers Discussed smoking cessation  Advised ongoing monitoring of blood pressure and blood glucose levels.    Patient Self-Care Activities:  Attend all scheduled provider appointments Call pharmacy for medication refills 3-7 days in advance of running out of medications Call provider office for new concerns or questions  Take medications as prescribed   Continue to monitor blood pressure and blood glucose level and notify provider if readings outside of established parameters Continue to work on smoking cessation.   Plan:  Telephone follow up appointment with care management team member scheduled for:  01/24/24 at 2:45 pm          VBCI RN Care Plan- diabetes       Problems:  Chronic Disease Management support and education needs related to DMII  Goal: Over the next 3 months the Patient will attend all scheduled medical appointments: with providers as evidenced by patient report/ chart review.         continue to work with Medical illustrator and/or Social Worker to address care management and care coordination needs related to DMII as evidenced by adherence to care management team scheduled appointments     demonstrate Ongoing adherence to prescribed treatment plan for DMII as evidenced by patient report /chart review take all medications exactly as prescribed and will call provider for medication related questions as evidenced by patient report / chart review.      Interventions:   Diabetes Interventions: Assessed patient's understanding of A1c goal: <7% Reviewed medications with patient and discussed importance of medication adherence Discussed plans with patient for ongoing care management follow up and provided patient with direct contact information for care management team Reviewed scheduled/upcoming provider appointments including:   Assessed social determinant of health barriers Assessed blood sugar readings.  Reviewed Rule of 15 for hypoglycemic management Lab Results  Component Value Date   HGBA1C 10.4 (A) 11/19/2023    Patient Self-Care Activities:  Attend all scheduled  provider appointments Call pharmacy for medication refills 3-7 days in advance of running out of medications Call provider office for new concerns or questions  Take medications as prescribed   check blood sugar at prescribed times: when you have symptoms of low or high blood sugar check feet daily for cuts, sores or redness take the blood sugar log to all doctor visits Check blood sugars at least 1-3 x per day or as recommended by provider. Notify provider for frequent blood sugars <70 or >250.   Plan:  Telephone follow up appointment with care management team member scheduled for:  01/24/24 at 2:45             Please call the Suicide and Crisis Lifeline: 988 call 1-800-273-TALK (toll free, 24 hour hotline) if you are experiencing a Mental Health or Behavioral Health Crisis or need someone to talk to.  The patient verbalized understanding of instructions, educational materials, and care plan provided today and DECLINED offer to receive copy of patient instructions, educational materials, and care plan.   Verba Girt RN, BSN, CCM [employer], Population Health Case Manager Phone: [phone]

## 2023-12-31 ENCOUNTER — Other Ambulatory Visit: Payer: Self-pay | Admitting: Family Medicine

## 2023-12-31 DIAGNOSIS — E559 Vitamin D deficiency, unspecified: Secondary | ICD-10-CM

## 2024-01-01 ENCOUNTER — Ambulatory Visit (INDEPENDENT_AMBULATORY_CARE_PROVIDER_SITE_OTHER): Admitting: Family Medicine

## 2024-01-01 ENCOUNTER — Encounter: Payer: Self-pay | Admitting: Family Medicine

## 2024-01-01 ENCOUNTER — Other Ambulatory Visit: Payer: Self-pay | Admitting: Family Medicine

## 2024-01-01 VITALS — BP 114/74 | HR 98 | Resp 16 | Ht 71.0 in | Wt 149.1 lb

## 2024-01-01 DIAGNOSIS — Z794 Long term (current) use of insulin: Secondary | ICD-10-CM | POA: Diagnosis not present

## 2024-01-01 DIAGNOSIS — E1142 Type 2 diabetes mellitus with diabetic polyneuropathy: Secondary | ICD-10-CM

## 2024-01-01 DIAGNOSIS — E559 Vitamin D deficiency, unspecified: Secondary | ICD-10-CM

## 2024-01-01 DIAGNOSIS — J449 Chronic obstructive pulmonary disease, unspecified: Secondary | ICD-10-CM

## 2024-01-01 DIAGNOSIS — I739 Peripheral vascular disease, unspecified: Secondary | ICD-10-CM | POA: Diagnosis not present

## 2024-01-01 NOTE — Progress Notes (Signed)
 Name: Mark Nash   MRN: [id number]    DOB: Nov 22, 1955   Date:01/01/2024       Progress Note  Subjective  Chief Complaint  Chief Complaint  Patient presents with   Medical Management of Chronic Issues    Started ozempic , pt states it messes up his stomach but doing ok on it.    Discussed the use of AI scribe software for clinical note transcription with the patient, who gave verbal consent to proceed.  History of Present Illness Mark Nash is a 68 year old male with diabetes who presents for a one month follow-up.  In March, his A1c was significantly elevated at 10.4. He is on Ozempic , initially at 0.25 mg, and experiences nausea after administration, lasting into the night. He is also on Tresiba , adjusting his dose between 8 to 10 units based on blood sugar readings, which have been mostly within 100 to 140 mg/dL. He takes Farxiga  daily.  He reports respiratory symptoms, including wheezing and coughing up phlegm, which started 2-3 days ago. No fever, but he mentions a headache and nasal congestion. He has COPD and uses Breztri  daily, which helps with his symptoms.  He is awaiting vascular surgery for his leg veins and has been cleared by his cardiologist. He experiences leg swelling and fatigue, impacting his ability to walk. He is monitoring his symptoms and is in contact with his vascular surgeon regarding the surgery schedule.    Patient Active Problem List   Diagnosis Date Noted   Atherosclerosis of native arteries of extremity with intermittent claudication (HCC) 10/09/2023   HFrEF (heart failure with reduced ejection fraction) (HCC) 01/30/2023   Chronic combined systolic and diastolic congestive heart failure (HCC) 01/28/2023   Pressure injury of skin 01/28/2023   CHF (congestive heart failure) (HCC) 01/26/2023   Senile purpura (HCC) 11/15/2022   Chronic a-fib (HCC) 03/15/2022   Type 2 diabetes mellitus with diabetic polyneuropathy, with long-term current use of insulin   (HCC) 03/16/2021   DDD (degenerative disc disease), lumbar 03/06/2019   Primary osteoarthritis involving multiple joints 03/06/2019   Lumbar radiculopathy 11/05/2018   Lumbar post-laminectomy syndrome 11/05/2018   Diabetic polyneuropathy associated with type 2 diabetes mellitus (HCC) 11/05/2018   CLE (columnar lined esophagus)    Duodenum ulcer    History of colonic polyps    Polyp of sigmoid colon    Diverticulosis of large intestine without diverticulitis    Benign neoplasm of ascending colon    Osteoarthritis of both hands 09/04/2017   Chronic pain of left knee 09/04/2017   Chronic hepatitis C without hepatic coma (HCC) 06/01/2016   Lumbar herniated disc 11/24/2015   Thrombocytopenia (HCC) 11/11/2015   Hepatitis C antibody test positive 07/13/2015   Chronic constipation 07/13/2015   Constipation 07/13/2015   Diabetes mellitus with coincident hypertension (HCC) 03/08/2015   Dyslipidemia 03/08/2015   Paranoid schizophrenia (HCC) 03/08/2015   Restless leg 03/08/2015   Callus of foot 03/08/2015   Claudication (HCC) 03/08/2015   Chronic obstructive pulmonary disease (HCC) 03/08/2015   Corn or callus 03/08/2015   Essential (primary) hypertension 03/08/2015   HLD (hyperlipidemia) 03/08/2015   Peripheral vascular disease (HCC) 03/08/2015   Polyneuropathy 03/08/2015   Current tobacco use 03/08/2015   Vitamin D  deficiency 04/26/2009    Social History   Tobacco Use   Smoking status: Every Day    Current packs/day: 1.50    Average packs/day: 1.5 packs/day for 46.0 years (69.0 ttl pk-yrs)    Types: Cigarettes   Smokeless  tobacco: Never  Substance Use Topics   Alcohol use: No    Alcohol/week: 0.0 standard drinks of alcohol     Current Outpatient Medications:    apixaban  (ELIQUIS ) 5 MG TABS tablet, Take 1 tablet by mouth twice daily, Disp: 180 tablet, Rfl: 1   blood glucose meter kit and supplies KIT, Dispense based on patient and insurance preference. Use up to tid E11.65,  LON:99, Disp: 1 each, Rfl: 0   Blood Glucose Monitoring Suppl (ONETOUCH VERIO FLEX SYSTEM) w/Device KIT, Use to check blood sugar up to four times daily as directed, Disp: 1 kit, Rfl: 0   Budeson-Glycopyrrol-Formoterol  (BREZTRI  AEROSPHERE) 160-9-4.8 MCG/ACT AERO, Inhale 2 puffs into the lungs 2 (two) times daily. Patient receives via [employer] Patient Assistance., Disp: 32.1 g, Rfl: 3   dapagliflozin  propanediol (FARXIGA ) 10 MG TABS tablet, Take 1 tablet (10 mg total) by mouth daily., Disp: 90 tablet, Rfl: 3   digoxin  (LANOXIN ) 0.125 MG tablet, Take 0.5 tablets (0.0625 mg total) by mouth daily., Disp: 90 tablet, Rfl: 3   gabapentin  (NEURONTIN ) 300 MG capsule, Take 300-600 mg by mouth See admin instructions. Take 1 capsule (300 mg) by mouth in the morning, take 1 capsule (300 mg) by mouth at noon, and 2 capsules (600 mg) by mouth in the evening, Disp: , Rfl:    glucose blood (ONETOUCH VERIO) test strip, Use to check blood sugar up to four times daily as directed, Disp: 400 each, Rfl: 3   Insulin  Degludec (TRESIBA ) 100 UNIT/ML SOLN, Inject 8 Units into the skin daily at 12 noon., Disp: 15 mL, Rfl: 1   Lancet Devices MISC, 1 each by Does not apply route 3 (three) times daily as needed. What ins will cover dx:E11.65, LON:99 tid, Disp: 100 each, Rfl: 12   Lancets (ONETOUCH DELICA PLUS LANCET30G) MISC, Use to check blood sugar up to four times daily as directed, Disp: 400 each, Rfl: 3   losartan  (COZAAR ) 25 MG tablet, Take 0.5 tablets (12.5 mg total) by mouth daily. Skip the dose if systolic BP less than 110 mmHg, Disp: 30 tablet, Rfl: 5   metoprolol  succinate (TOPROL -XL) 200 MG 24 hr tablet, Take 1 tablet (200 mg total) by mouth daily. Take with or immediately following a meal., Disp: 30 tablet, Rfl: 11   omeprazole  (PRILOSEC) 20 MG capsule, Take 20 mg by mouth 3 (three) times daily., Disp: , Rfl:    potassium chloride  SA (KLOR-CON  M) 20 MEQ tablet, Take 1 tablet (20 mEq total) by mouth daily., Disp: 30 tablet,  Rfl: 0   pravastatin  (PRAVACHOL ) 20 MG tablet, Take 1 tablet (20 mg total) by mouth daily with supper., Disp: 90 tablet, Rfl: 3   Semaglutide ,0.25 or 0.5MG /DOS, (OZEMPIC , 0.25 OR 0.5 MG/DOSE,) 2 MG/1.5ML SOPN, Inject 0.25 mg into the skin once a week., Disp: 2 mL, Rfl: 0   tiZANidine (ZANAFLEX) 4 MG tablet, Take 4 mg by mouth every 12 (twelve) hours., Disp: , Rfl:    torsemide  (DEMADEX ) 20 MG tablet, Take 1 tablet (20 mg total) by mouth daily before breakfast. With potassium pill and hold losartan  when you take this medication, Disp: 30 tablet, Rfl: 0   traMADol  (ULTRAM ) 50 MG tablet, Take 50 mg by mouth every 12 (twelve) hours., Disp: , Rfl:    triamcinolone  cream (KENALOG ) 0.1 %, Apply topically 2 (two) times daily., Disp: , Rfl:    Vitamin D , Ergocalciferol , (DRISDOL ) 1.25 MG (50000 UNIT) CAPS capsule, TAKE 1 CAPSULE BY MOUTH EVERY 14 DAYS, Disp: 6 capsule,  Rfl: 0  Allergies  Allergen Reactions   Penicillins Hives and Swelling    As a child. Has patient had a PCN reaction causing immediate rash, facial/tongue/throat swelling, SOB or lightheadedness with hypotension: YES  Has patient had a PCN reaction causing severe rash involving mucus membranes or skin necrosis: UNKNOWN Has patient had a PCN reaction that required hospitalization: UNKNOWN Has patient had a PCN reaction occurring within the last 10 years: NO   Aspirin  Nausea Only   Ibuprofen Nausea Only   Lyrica  [Pregabalin ] Nausea Only   Acetaminophen  Nausea Only    ROS  Ten systems reviewed and is negative except as mentioned in HPI    Objective  Vitals:   01/01/24 1526  BP: 114/74  Pulse: 98  Resp: 16  SpO2: 93%  Weight: 149 lb 1.6 oz (67.6 kg)  Height: 5\' 11"  (1.803 m)    Body mass index is 20.8 kg/m.  Physical Exam CONSTITUTIONAL: Patient appears well-developed and well-nourished. No distress. HEENT: Head atraumatic, normocephalic, neck supple. CARDIOVASCULAR: Normal rate, regular rhythm and normal heart sounds.  No murmur heard. No BLE edema. PULMONARY: Effort normal. Breath sounds normal with scattered  wheezing present, no crackles. No respiratory distress. ABDOMINAL: There is no tenderness or distention. MUSCULOSKELETAL: Normal gait. Without gross motor or sensory deficit. PSYCHIATRIC: Patient has a normal mood and affect. Behavior is normal. Judgment and thought content normal.  Recent Results (from the past 2160 hours)  HM DIABETES FOOT EXAM     Status: None   Collection Time: 10/08/23 12:00 AM  Result Value Ref Range   HM Diabetic Foot Exam normal     Comment: [facility]  HM DIABETES EYE EXAM     Status: None   Collection Time: 10/24/23 10:49 AM  Result Value Ref Range   HM Diabetic Eye Exam No Retinopathy No Retinopathy    Comment: ABS BY HIM  Glucose, capillary     Status: Abnormal   Collection Time: 11/05/23  7:16 AM  Result Value Ref Range   Glucose-Capillary 175 (H) 70 - 99 mg/dL    Comment: Glucose reference range applies only to samples taken after fasting for at least 8 hours.  BUN     Status: None   Collection Time: 11/05/23  7:20 AM  Result Value Ref Range   BUN 18 8 - 23 mg/dL    Comment: Performed at [facility], [address] [place], [place] [address]  Creatinine, serum     Status: None   Collection Time: 11/05/23  7:20 AM  Result Value Ref Range   Creatinine, Ser 0.88 0.61 - 1.24 mg/dL   GFR, Estimated >41 >32 mL/min    Comment: (NOTE) Calculated using the CKD-EPI Creatinine Equation (2021) Performed at [facility], [address] [place], [place] [address]   Glucose, capillary     Status: Abnormal   Collection Time: 11/05/23  8:57 AM  Result Value Ref Range   Glucose-Capillary 180 (H) 70 - 99 mg/dL    Comment: Glucose reference range applies only to samples taken after fasting for at least 8 hours.  POCT glycosylated hemoglobin (Hb A1C)     Status: Abnormal   Collection Time: 11/19/23  2:21 PM  Result Value Ref Range    Hemoglobin A1C 10.4 (A) 4.0 - 5.6 %   HbA1c POC (<> result, manual entry)     HbA1c, POC (prediabetic range)     HbA1c, POC (controlled diabetic range)    Urine Microalbumin w/creat.  ratio     Status: None   Collection Time: 11/19/23  2:53 PM  Result Value Ref Range   Creatinine, Urine 65 20 - 320 mg/dL   Microalb, Ur 1.4 mg/dL    Comment: Reference Range Not established    Microalb Creat Ratio 22 <30 mg/g creat    Comment: . The ADA defines abnormalities in albumin excretion as follows: Aaron Aas Albuminuria Category        Result (mg/g creatinine) . Normal to Mildly increased   <30 Moderately increased         30-299  Severely increased           > OR = 300 . The ADA recommends that at least two of three specimens collected within a 3-6 month period be abnormal before considering a patient to be within a diagnostic category.   Lipid panel     Status: None   Collection Time: 11/19/23  2:53 PM  Result Value Ref Range   Cholesterol 116 <200 mg/dL   HDL 58 > OR = 40 mg/dL   Triglycerides 84 <657 mg/dL   LDL Cholesterol (Calc) 42 mg/dL (calc)    Comment: Reference range: <100 . Desirable range <100 mg/dL for primary prevention;   <70 mg/dL for patients with CHD or diabetic patients  with > or = 2 CHD risk factors. Aaron Aas LDL-C is now calculated using the Martin-Hopkins  calculation, which is a validated novel method providing  better accuracy than the Friedewald equation in the  estimation of LDL-C.  Melinda Sprawls et al. Erroll Heard. 8469;629(52): 2061-2068  (http://education.[facility].com/faq/FAQ164)    Total CHOL/HDL Ratio 2.0 <5.0 (calc)   Non-HDL Cholesterol (Calc) 58 <841 mg/dL (calc)    Comment: For patients with diabetes plus 1 major ASCVD risk  factor, treating to a non-HDL-C goal of <100 mg/dL  (LDL-C of <32 mg/dL) is considered a therapeutic  option.   COMPLETE METABOLIC PANEL WITH GFR     Status: Abnormal   Collection Time: 11/19/23  2:53 PM  Result Value Ref Range    Glucose, Bld 237 (H) 65 - 99 mg/dL    Comment: .            Fasting reference interval . For someone without known diabetes, a glucose value >125 mg/dL indicates that they may have diabetes and this should be confirmed with a follow-up test. .    BUN 16 7 - 25 mg/dL   Creat 4.40 1.02 - 7.25 mg/dL   BUN/Creatinine Ratio SEE NOTE: 6 - 22 (calc)    Comment:    Not Reported: BUN and Creatinine are within    reference range. .    Sodium 137 135 - 146 mmol/L   Potassium 4.4 3.5 - 5.3 mmol/L   Chloride 99 98 - 110 mmol/L   CO2 30 20 - 32 mmol/L   Calcium 9.6 8.6 - 10.3 mg/dL   Total Protein 5.9 (L) 6.1 - 8.1 g/dL   Albumin 3.9 3.6 - 5.1 g/dL   Globulin 2.0 1.9 - 3.7 g/dL (calc)   AG Ratio 2.0 1.0 - 2.5 (calc)   Total Bilirubin 1.3 (H) 0.2 - 1.2 mg/dL   Alkaline phosphatase (APISO) 88 35 - 144 U/L   AST 17 10 - 35 U/L   ALT 17 9 - 46 U/L  Digoxin  level     Status: None   Collection Time: 12/24/23  3:59 PM  Result Value Ref Range   Digoxin , Serum 0.7 0.5 - 0.9 ng/mL  Comment: Concentrations above 2.0 ng/mL are generally considered toxic. Some overlap of toxic and non-toxic values have been reported.                             Detection Limit = 0.4 ng/mL Therapeutic range is derived from 2013 ACCF/AHA Guidelines for the Management of Heart Failure.   Basic metabolic panel with GFR     Status: None   Collection Time: 12/24/23  3:59 PM  Result Value Ref Range   Glucose 94 70 - 99 mg/dL   BUN 14 8 - 27 mg/dL   Creatinine, Ser 7.82 0.76 - 1.27 mg/dL   eGFR 82 >95 AO/ZHY/8.65   BUN/Creatinine Ratio 14 10 - 24   Sodium 139 134 - 144 mmol/L   Potassium 4.9 3.5 - 5.2 mmol/L   Chloride 101 96 - 106 mmol/L   CO2 27 20 - 29 mmol/L   Calcium 9.7 8.6 - 10.2 mg/dL  CBC     Status: None   Collection Time: 12/24/23  3:59 PM  Result Value Ref Range   WBC 6.6 3.4 - 10.8 x10E3/uL   RBC 5.58 4.14 - 5.80 x10E6/uL   Hemoglobin 16.7 13.0 - 17.7 g/dL   Hematocrit 78.4 69.6 - 51.0 %    MCV 91 79 - 97 fL   MCH 29.9 26.6 - 33.0 pg   MCHC 32.9 31.5 - 35.7 g/dL   RDW 29.5 28.4 - 13.2 %   Platelets 171 150 - 450 x10E3/uL    Assessment & Plan Type 2 diabetes mellitus with hyperglycemia/diabetic neuropathy and PAD Type 2 diabetes with A1c of 10.4%. Experiencing nausea with Ozempic . Plans to increase Ozempic  to improve glycemic control. - Increase Ozempic  to 0.5 mg once weekly. - Advise taking Ozempic  before bed to mitigate nausea. - Instruct to eat smaller portions and avoid eating immediately after Ozempic  administration. - Maintain Tresiba  at 8 units daily, adjust only if blood sugars exceed 140 mg/dL for three consecutive days. - Continue Farxiga  for diabetes, renal, and cardiac health.  Chronic obstructive pulmonary disease (COPD) COPD with recent wheezing and productive cough, likely due to mild cold. - Continue Breztri  daily. - Use Mucinex  for congestion. - Monitor for worsening symptoms or fever.  Peripheral vascular disease Peripheral vascular disease with planned vascular surgery. Cleared by cardiologist. - Await scheduling of vascular surgery. - Consider fructosamine test if required by vascular surgeon for preoperative clearance.

## 2024-01-14 DIAGNOSIS — B351 Tinea unguium: Secondary | ICD-10-CM | POA: Diagnosis not present

## 2024-01-14 DIAGNOSIS — E1142 Type 2 diabetes mellitus with diabetic polyneuropathy: Secondary | ICD-10-CM | POA: Diagnosis not present

## 2024-01-14 DIAGNOSIS — L84 Corns and callosities: Secondary | ICD-10-CM | POA: Diagnosis not present

## 2024-01-14 DIAGNOSIS — I739 Peripheral vascular disease, unspecified: Secondary | ICD-10-CM | POA: Diagnosis not present

## 2024-01-18 ENCOUNTER — Telehealth: Payer: Self-pay | Admitting: Physician Assistant

## 2024-01-18 MED ORDER — METOPROLOL SUCCINATE ER 200 MG PO TB24: 200.0000 mg | ORAL_TABLET | Freq: Every day | ORAL | 3 refills | Status: AC

## 2024-01-18 NOTE — Telephone Encounter (Signed)
 Pt's medication was sent to pt's pharmacy as requested. Confirmation received.

## 2024-01-18 NOTE — Telephone Encounter (Signed)
*  STAT* If patient is at the pharmacy, call can be transferred to refill team.   1. Which medications need to be refilled? (please list name of each medication and dose if known)   metoprolol  succinate (TOPROL -XL) 200 MG 24 hr tablet      4. Which pharmacy/location (including street and city if local pharmacy) is medication to be sent to?  [id number] - , [place] - [address]     5. Do they need a 30 day or 90 day supply? 90

## 2024-01-22 ENCOUNTER — Telehealth (INDEPENDENT_AMBULATORY_CARE_PROVIDER_SITE_OTHER): Payer: Self-pay

## 2024-01-22 NOTE — Telephone Encounter (Signed)
 Spoke with the patient and he is scheduled with Dr. Vonna Guardian for a bilateral femoral endarterectomies and right popliteal and tibial intervention at [facility]. Pre-admit will call patient to schedule pre-op at the [facility]. Pre-surgical instructions were discussed and will be mailed.

## 2024-01-24 ENCOUNTER — Other Ambulatory Visit: Payer: Self-pay

## 2024-01-24 NOTE — Patient Instructions (Signed)
 Visit Information  Thank you for taking time to visit with me today. Please don't hesitate to contact me if I can be of assistance to you before our next scheduled appointment.  Your next care management appointment is by telephone on 02/22/24 at 3 pm  Telephone follow-up in 1 month with Michele Ahle, RN Case manager  Please call the care guide team at [phone] if you need to cancel, schedule, or reschedule an appointment.   Please call the Suicide and Crisis Lifeline: 988 call 1-800-273-TALK (toll free, 24 hour hotline) if you are experiencing a Mental Health or Behavioral Health Crisis or need someone to talk to.  Verba Girt RN, BSN, CCM [employer], Population Health Case Manager Phone: [phone]

## 2024-01-24 NOTE — Patient Outreach (Signed)
 Complex Care Management   Visit Note  01/24/2024  Name:  Mark Nash MRN: [id number] DOB: October 06, 1955  Situation: Referral received for Complex Care Management related to diabetes I obtained verbal consent from Patient.  Visit completed with patient  on the phone  Background:   Past Medical History:  Diagnosis Date   Arthritis    Asthma    Benign neoplasm of ascending colon    polyps   Constipation    COPD (chronic obstructive pulmonary disease) (HCC)    NO INHALER   Diabetes mellitus without complication (HCC)    Type 2   Diverticulosis    Dyspnea    RARE-WITH EXERTION DUE TO COPD   GERD (gastroesophageal reflux disease)    Hepatitis C    History of kidney stones    H/O   Hyperlipidemia    Hypertension 06/10/2018   currently not under control   Left inguinal hernia    Peripheral vascular disease (HCC)    Restless legs    Schizoaffective disorder (HCC)    Pt denies   Thrombocytopenia (HCC)    Vitamin D  deficiency    Wears dentures    full upper and lower    Assessment: Patient Reported Symptoms:  Cognitive Cognitive Status: Alert and oriented to person, place, and time, Insightful and able to interpret abstract concepts, Normal speech and language skills      Neurological Neurological Review of Symptoms: No symptoms reported    HEENT HEENT Symptoms Reported: No symptoms reported      Cardiovascular Cardiovascular Symptoms Reported: No symptoms reported Other Cardiovascular Symptoms: right leg pain Does patient have uncontrolled Hypertension?: No Cardiovascular Conditions: Hypertension, Heart failure (peripheral vascular disease.) Cardiovascular Management Strategies: Routine screening, Medication therapy, Diet modification Cardiovascular Comment: Patient states he is scheduled for leg surgery 02/20/24. he denies any increase in leg pain.  States discomfort is tolerable.  Respiratory Respiratory Symptoms Reported: No symptoms reported    Endocrine Patient  reports the following symptoms related to hypoglycemia or hyperglycemia : No symptoms reported Is patient diabetic?: Yes Is patient checking blood sugars at home?: Yes Endocrine Conditions: Diabetes Endocrine Management Strategies: Routine screening, Medication therapy, Diet modification Endocrine Comment: Patient reports his Ozempic  and tresiba  dosage was adjusted.  Denies any increase in nausea or constipation/ diarrhea. Patient reports decrease in appetite.  Gastrointestinal Gastrointestinal Symptoms Reported: Nausea Gastrointestinal Conditions: Nausea Gastrointestinal Management Strategies:  (routine follow up) Nutrition Risk Screen (CP): No indicators present  Genitourinary Genitourinary Symptoms Reported: No symptoms reported    Integumentary Integumentary Symptoms Reported: No symptoms reported    Musculoskeletal Musculoskelatal Symptoms Reviewed: No symptoms reported        Psychosocial Psychosocial Symptoms Reported: No symptoms reported            01/01/2024    3:23 PM  Depression screen PHQ 2/9  Decreased Interest 0  Down, Depressed, Hopeless 0  PHQ - 2 Score 0    There were no vitals filed for this visit.  Medications Reviewed Today     Reviewed by Othal Kubitz E, RN (Registered Nurse) on 01/24/24 at 1532  Med List Status: <None>   Medication Order Taking? Sig Documenting Provider Last Dose Status Informant  apixaban  (ELIQUIS ) 5 MG TABS tablet [id number] No Take 1 tablet by mouth twice daily Gollan, Timothy J, MD Taking Active   blood glucose meter kit and supplies KIT [id number] No Dispense based on patient and insurance preference. Use up to tid E11.65, LON:99 Sowles, Krichna, MD Taking Active Self  Blood Glucose Monitoring Suppl (ONETOUCH VERIO FLEX SYSTEM) w/Device KIT [id number] No Use to check blood sugar up to four times daily as directed Sowles, Krichna, MD Taking Active   Budeson-Glycopyrrol-Formoterol  (BREZTRI  AEROSPHERE) 160-9-4.8 MCG/ACT AERO [id number] No  Inhale 2 puffs into the lungs 2 (two) times daily. Patient receives via [employer] Patient Assistance. Sowles, Krichna, MD Taking Active Self  dapagliflozin  propanediol (FARXIGA ) 10 MG TABS tablet [id number] No Take 1 tablet (10 mg total) by mouth daily. Sowles, Krichna, MD Taking Active Self  digoxin  (LANOXIN ) 0.125 MG tablet [id number] No Take 0.5 tablets (0.0625 mg total) by mouth daily. Roark Chick, PA-C Taking Active   gabapentin  (NEURONTIN ) 300 MG capsule [id number] No Take 300-600 mg by mouth See admin instructions. Take 1 capsule (300 mg) by mouth in the morning, take 1 capsule (300 mg) by mouth at noon, and 2 capsules (600 mg) by mouth in the evening [provider] Taking Active Self  glucose blood (ONETOUCH VERIO) test strip [id number] No Use to check blood sugar up to four times daily as directed Sowles, Krichna, MD Taking Active   Insulin  Degludec (TRESIBA ) 100 UNIT/ML SOLN [id number] No Inject 8 Units into the skin daily at 12 noon. Sowles, Krichna, MD Taking Active            Med Note Marrie Sizer, Cain Fitzhenry E   Tue Dec 25, 2023  3:41 PM) Patient states provider request he also Go up by 2 units of Tresiba  every 3 days if glucose fasting is above 140  Lancet Devices MISC [id number] No 1 each by Does not apply route 3 (three) times daily as needed. What ins will cover dx:E11.65, LON:99 tid Rockney Cid, DO Taking Active Self  Lancets [facility] Jewelene Morton PLUS [place]) MISC [id number] No Use to check blood sugar up to four times daily as directed Sowles, Krichna, MD Taking Active   losartan  (COZAAR ) 25 MG tablet [id number] No Take 0.5 tablets (12.5 mg total) by mouth daily. Skip the dose if systolic BP less than 110 mmHg West, Katlyn D, NP Taking Active   metoprolol  (TOPROL -XL) 200 MG 24 hr tablet [id number]  Take 1 tablet (200 mg total) by mouth daily. Take with or immediately following a meal. Dunn, Elvia Hammans, PA-C  Active   omeprazole  (PRILOSEC) 20 MG capsule [id number] No Take 20 mg by mouth 3 (three)  times daily. [provider] Taking Active   potassium chloride  SA (KLOR-CON  M) 20 MEQ tablet [id number] No Take 1 tablet (20 mEq total) by mouth daily. Sowles, Krichna, MD Taking Active   pravastatin  (PRAVACHOL ) 20 MG tablet [id number] No Take 1 tablet (20 mg total) by mouth daily with supper. Sowles, Krichna, MD Taking Active   Semaglutide ,0.25 or 0.5MG /DOS, (OZEMPIC , 0.25 OR 0.5 MG/DOSE,) 2 MG/1.5ML SOPN [id number] No Inject 0.25 mg into the skin once a week. Sowles, Krichna, MD Taking Active   tiZANidine (ZANAFLEX) 4 MG tablet [id number] No Take 4 mg by mouth every 12 (twelve) hours. Cordie Deters, MD Taking Active Self           Med Note Elissa Guise   Wed Feb 07, 2023  2:38 PM)    torsemide  (DEMADEX ) 20 MG tablet [id number] No Take 1 tablet (20 mg total) by mouth daily before breakfast. With potassium pill and hold losartan  when you take this medication Sowles, Krichna, MD Taking Active   traMADol  (ULTRAM ) 50 MG tablet [id number] No Take 50 mg by mouth every 12 (twelve) hours. Cordie Deters, MD Taking Active Self  triamcinolone  cream (KENALOG ) 0.1 % [id number] No Apply topically 2 (two) times daily. [provider] Taking Active   Vitamin D , Ergocalciferol , (DRISDOL ) 1.25 MG (50000 UNIT) CAPS capsule [id number]  TAKE ONE CAPSULE BY MOUTH EVERY 14 DAYS Sowles, Krichna, MD  Active   Med List Note Huston Maiers, RN 10/03/18 1438): UDS 10/03/18            Recommendation:   PCP Follow-up  Follow Up Plan:   Telephone follow-up in 1 month. Patient will be transferring to Michele Ahle, RN care management for ongoing case management. Patient agreeable to transfer and ongoing follow up.   Verba Girt RN, BSN, CCM [employer], Population Health Case Manager Phone: [phone]

## 2024-01-30 ENCOUNTER — Other Ambulatory Visit: Payer: Self-pay | Admitting: Pharmacist

## 2024-01-30 DIAGNOSIS — I5020 Unspecified systolic (congestive) heart failure: Secondary | ICD-10-CM

## 2024-01-30 DIAGNOSIS — I4892 Unspecified atrial flutter: Secondary | ICD-10-CM

## 2024-01-30 DIAGNOSIS — E1142 Type 2 diabetes mellitus with diabetic polyneuropathy: Secondary | ICD-10-CM

## 2024-01-30 NOTE — Progress Notes (Signed)
 01/30/2024 Name: Mark Nash MRN: [id number] DOB: Sep 14, 1955  Chief Complaint  Patient presents with   Medication Management   Medication Assistance    Mark Nash is a 68 y.o. year old male who presented for a telephone visit.   They were referred to the pharmacist by their PCP for assistance in managing diabetes and medication access.      Subjective:   Care Team: Primary Care Provider: Sowles, Krichna, MD ; Next Scheduled Visit: 03/25/2024 Cardiologist: Belva Boyden MD Pain Management: Nancyann Aye, MD Nurse Care Manager: Ethelene Herald, RN; Next Scheduled Visit: 02/22/2024 Vein & Vascular Specialist: Celso College, MD   Medication Access/Adherence  Current Pharmacy:  [facility] [address] [place] - [address] [place] [address] Phone: [phone] Fax: [phone]  [facility] - [place], [place] - [address] [place] [address] Phone: [phone] Fax: [phone]    Patient reports affordability concerns with their medications: No  Patient reports access/transportation concerns to their pharmacy: No  Patient reports adherence concerns with their medications:  No      Diabetes:   Current medications:  - Farxiga  10 mg daily - Tresiba  FlexTouch 8-10 units daily - Ozempic  0.5 mg weekly on Sundays (increased to current dose on 5/25)  Reports has mild nausea, but is improving tolerating well   Medications tried in the past: Steglatro, metformin, Levemir     Reports morning fasting blood sugar readings ranging 106-120  Notes tried [employer] 2 sensor in the past, but bothered him and he stopped using it    Patient denies hypoglycemic s/sx including dizziness, shakiness, sweating.    Statin therapy: pravastatin  20 mg daily   Current medication access support: enrolled in patient assistance for Farxiga  through AZ&Me and Ozempic  through Novo Nordisk through 08/27/2024    Heart  Failure/Hypertension:   Current medications:  ACEi/ARB/ARNI: losartan  25 mg -1/2 tablet daily. (Skips the dose if systolic BP less than 110 mmHg) SGLT2i: Farxiga  10 mg daily Beta blocker: metoprolol  ER 200 mg daily Other: digoxin  0.125 mg - 1/2 tablet (0.0625 mg) daily   Reports has upper arm blood pressure monitor, but denies checking recently Denies checking home weight recently, but has home scale   Previous therapies tried: torsemide  and Klor-Con  - reports Cardiology aware that he is not taking these   Patient denies volume overload signs or symptoms including shortness of breath, lower extremity edema, increased use of pillows at night   Denies symptom of hypotension    Current medication access support: enrolled in patient assistance for Farxiga  through AZ&Me through 08/27/2024     Atrial Fibrillation:   Current medications: Rate Control: metoprolol  ER 200 mg daily - digoxin  0.125 mg - 1/2 tablet (0.0625 mg) daily Anticoagulation Regimen: Eliquis  5 mg twice daily   CHA2DS2-VASc Score = 5  The patient's score is based upon: CHF History: 1 HTN History: 1 Diabetes History: 1 Stroke History: 0 Vascular Disease History: 1 Age Score: 1 Gender Score: 0  Denies monitoring home blood pressure recently   Patient denies hypotensive s/sx including dizziness, lightheadedness.    Current medication access support: None.    COPD:   Current medications: - Breztri  inhaler - 2 puffs twice daily             Confirms rinses and spits out after each use     Current medication access support: enrolled in patient assistance for Breztri  through AZ&Me  through 08/27/2024   Tobacco Abuse:  Smokes 1.5-2 packs/day Denies interest in quitting at this time    Objective:  Lab Results  Component Value Date   HGBA1C 10.4 (A) 11/19/2023    Lab Results  Component Value Date   CREATININE 1.00 12/24/2023   BUN 14 12/24/2023   NA 139 12/24/2023   K 4.9 12/24/2023   CL 101  12/24/2023   CO2 27 12/24/2023    Lab Results  Component Value Date   CHOL 116 11/19/2023   HDL 58 11/19/2023   LDLCALC 42 11/19/2023   TRIG 84 11/19/2023   CHOLHDL 2.0 11/19/2023   BP Readings from Last 3 Encounters:  01/01/24 114/74  12/25/23 130/70  12/24/23 (!) 140/76   Pulse Readings from Last 3 Encounters:  01/01/24 98  12/24/23 88  11/19/23 88     Medications Reviewed Today   Medications were not reviewed in this encounter       Assessment/Plan:   Diabetes: - Reviewed long term cardiovascular and renal outcomes of uncontrolled blood sugar - Have reviewed goal A1c, goal fasting, and goal 2 hour post prandial glucose - Reviewed dietary modifications including importance of having regular well-balanced meals and snacks throughout the day, while controlling carbohydrate portion sizes  Advise against skipping meals - Have counseled patient on s/s of low blood sugar and how to treat lows - Denies interest in retrying continuous glucose monitor at this time, but will consider for the future - Encourage patient to check glucose, keep log of results and have this record to review at upcoming medical appointments. Patient to contact provider office sooner if needed for readings outside of established parameters or symptoms - Patient to follow up with Novo Nordisk patient assistance program to as needed for refills of Ozempic  and AZ&Me as needed for refills of Farxiga    Heart Failure/Hypertension: - Reviewed appropriate blood pressure monitoring technique and reviewed goal blood pressure - Recommend to restart monitoring home blood pressure, keep log of results and have this record to review at upcoming medical appointments. Patient to contact provider office sooner if needed for readings outside of established parameters or symptoms   Atrial Fibrillation: - Recommend to monitor home blood pressure, keep log of results and have this record to review at upcoming medical  appointments. Patient to contact provider office sooner if needed for readings outside of established parameters or symptoms     COPD: - Have discussed inhaler technique, including importance of rinsing out his mouth after each use of Breztri    Follow Up Plan: Clinical Pharmacist will follow up with patient by telephone on 05/14/2024 at 9:30 AM     Arthur Lash, PharmD, [facility] [phone]

## 2024-01-30 NOTE — Patient Instructions (Signed)
 Goals Addressed             This Visit's Progress    Pharmacy Goals       The goal A1c is less than 7%. This is the best way to reduce the risk of the long term complications of diabetes, including heart disease, kidney disease, eye disease, strokes, and nerve damage. An A1c of less than 7% corresponds with fasting sugars less than 130 and 2 hour after meal sugars less than 180. Please check your blood sugar daily.   If you need to reach out to patient assistance programs regarding refills or to find out the status of your application, you can do so by calling:  [employer] at [phone] [employer] at [phone]  Check your blood pressure twice weekly, and any time you have concerning symptoms like headache, chest pain, dizziness, shortness of breath, or vision changes.   Our goal is less than 130/80.  To appropriately check your blood pressure, make sure you do the following:  1) Avoid caffeine, exercise, or tobacco products for 30 minutes before checking. Empty your bladder. 2) Sit with your back supported in a flat-backed chair. Rest your arm on something flat (arm of the chair, table, etc). 3) Sit still with your feet flat on the floor, resting, for at least 5 minutes.  4) Check your blood pressure. Take 1-2 readings.  5) Write down these readings and bring with you to any provider appointments.  Bring your home blood pressure machine with you to a provider's office for accuracy comparison at least once a year.   Make sure you take your blood pressure medications before you come to any office visit, even if you were asked to fast for labs.  Thank you!  Estelle Grumbles, PharmD, [facility] [phone]

## 2024-02-06 ENCOUNTER — Other Ambulatory Visit (INDEPENDENT_AMBULATORY_CARE_PROVIDER_SITE_OTHER): Payer: Self-pay | Admitting: Nurse Practitioner

## 2024-02-06 DIAGNOSIS — I70211 Atherosclerosis of native arteries of extremities with intermittent claudication, right leg: Secondary | ICD-10-CM

## 2024-02-07 ENCOUNTER — Other Ambulatory Visit: Payer: Self-pay

## 2024-02-07 ENCOUNTER — Encounter
Admission: RE | Admit: 2024-02-07 | Discharge: 2024-02-07 | Disposition: A | Discharge status: Home / Self Care | Source: Ambulatory Visit | Attending: Vascular Surgery | Admitting: Vascular Surgery

## 2024-02-07 VITALS — BP 148/79 | HR 44 | Temp 97.8°F | Resp 20 | Ht 71.0 in | Wt 149.7 lb

## 2024-02-07 DIAGNOSIS — Z794 Long term (current) use of insulin: Secondary | ICD-10-CM | POA: Insufficient documentation

## 2024-02-07 DIAGNOSIS — I70211 Atherosclerosis of native arteries of extremities with intermittent claudication, right leg: Secondary | ICD-10-CM | POA: Diagnosis not present

## 2024-02-07 DIAGNOSIS — Z01812 Encounter for preprocedural laboratory examination: Secondary | ICD-10-CM | POA: Diagnosis not present

## 2024-02-07 DIAGNOSIS — E1142 Type 2 diabetes mellitus with diabetic polyneuropathy: Secondary | ICD-10-CM | POA: Diagnosis not present

## 2024-02-07 DIAGNOSIS — I10 Essential (primary) hypertension: Secondary | ICD-10-CM | POA: Insufficient documentation

## 2024-02-07 DIAGNOSIS — E119 Type 2 diabetes mellitus without complications: Secondary | ICD-10-CM

## 2024-02-07 HISTORY — DX: Cardiac arrhythmia, unspecified: I49.9

## 2024-02-07 HISTORY — DX: Atherosclerotic heart disease of native coronary artery without angina pectoris: I25.10

## 2024-02-07 LAB — TYPE AND SCREEN
ABO/RH(D): A POS
Antibody Screen: NEGATIVE

## 2024-02-07 LAB — SURGICAL PCR SCREEN
MRSA, PCR: NEGATIVE
Staphylococcus aureus: NEGATIVE

## 2024-02-07 NOTE — Patient Instructions (Addendum)
 Your procedure is scheduled on: Wednesday 02/20/24  Report to the Registration Desk on the 1st floor of the [facility]. To find out your arrival time, please call [phone] between 1PM - 3PM on: Tuesday 02/19/24 If your arrival time is 6:00 am, do not arrive before that time as the [facility] entrance doors do not open until 6:00 am.  REMEMBER: Instructions that are not followed completely may result in serious medical risk, up to and including death; or upon the discretion of your surgeon and anesthesiologist your surgery may need to be rescheduled.  Do not eat food or drink fluids after midnight the night before surgery.  No gum chewing or hard candies.   One week prior to surgery: Stop Anti-inflammatories (NSAIDS) such as Advil, Aleve, Ibuprofen, Motrin, Naproxen, Naprosyn and Aspirin  based products such as Excedrin, Goody's Powder, BC Powder. Stop ANY OVER THE COUNTER supplements until after surgery.  You may however, continue to take Tylenol  if needed for pain up until the day of surgery.  Stop dapagliflozin  propanediol (FARXIGA ) 10 MG 3 days prior to surgery (take last dose Saturday 02/16/24) Stop Semaglutide ,0.25 or 0.5MG /DOS, (OZEMPIC , 0.25 OR 0.5 MG/DOSE,) 2 MG/1.5ML SOPN (7 days prior to surgery Do not take dose after 02/10/24) Do not take any Insulin  Degludec (TRESIBA ) 100 UNIT/ML SOLN the morning of your procedure. Stop apixaban  (ELIQUIS ) 5 MG 2 days prior to surgery, (Take last dose Sunday 02/17/24)  Continue taking all of your other prescription medications up until the day of surgery.  ON THE DAY OF SURGERY ONLY TAKE THESE MEDICATIONS WITH SIPS OF WATER:  digoxin  (LANOXIN ) 0.125 MG  gabapentin  (NEURONTIN ) 300 MG  omeprazole  (PRILOSEC) 20 MG   Use inhalers on the day of surgery and bring to the hospital. Budeson-Glycopyrrol-Formoterol  (BREZTRI  AEROSPHERE) 160-9-4.8 MCG/ACT AERO   No Alcohol for 24 hours before or after surgery.  No Smoking including e-cigarettes  for 24 hours before surgery.  No chewable tobacco products for at least 6 hours before surgery.  No nicotine patches on the day of surgery.  Do not use any recreational drugs for at least a week (preferably 2 weeks) before your surgery.  Please be advised that the combination of cocaine and anesthesia may have negative outcomes, up to and including death. If you test positive for cocaine, your surgery will be cancelled.  On the morning of surgery brush your teeth with toothpaste and water, you may rinse your mouth with mouthwash if you wish. Do not swallow any toothpaste or mouthwash.  Use CHG Soap or wipes as directed on instruction sheet.  Do not wear jewelry, make-up, hairpins, clips or nail polish.  For welded (permanent) jewelry: bracelets, anklets, waist bands, etc.  Please have this removed prior to surgery.  If it is not removed, there is a chance that hospital personnel will need to cut it off on the day of surgery.  Do not wear lotions, powders, or perfumes.   Do not shave body hair from the neck down 48 hours before surgery.  Contact lenses, hearing aids and dentures may not be worn into surgery.  Do not bring valuables to the hospital. [facility] is not responsible for any missing/lost belongings or valuables.   Notify your doctor if there is any change in your medical condition (cold, fever, infection).  Wear comfortable clothing (specific to your surgery type) to the hospital.  After surgery, you can help prevent lung complications by doing breathing exercises.  Take deep breaths and cough every 1-2  hours. Your doctor may order a device called an Incentive Spirometer to help you take deep breaths. When coughing or sneezing, hold a pillow firmly against your incision with both hands. This is called "splinting." Doing this helps protect your incision. It also decreases belly discomfort.  If you are being admitted to the hospital overnight, leave your suitcase in the  car. After surgery it may be brought to your room.  In case of increased patient census, it may be necessary for you, the patient, to continue your postoperative care in the Same Day Surgery department.  If you are being discharged the day of surgery, you will not be allowed to drive home. You will need a responsible individual to drive you home and stay with you for 24 hours after surgery.   If you are taking public transportation, you will need to have a responsible individual with you.  Please call the Pre-admissions Testing Dept. at [phone] if you have any questions about these instructions.  Surgery Visitation Policy:  Patients having surgery or a procedure may have two visitors.  Children under the age of 46 must have an adult with them who is not the patient.  Inpatient Visitation:    Visiting hours are 7 a.m. to 8 p.m. Up to four visitors are allowed at one time in a patient room. The visitors may rotate out with other people during the day.  One visitor age 22 or older may stay with the patient overnight and must be in the room by 8 p.m.    Preparing for Surgery with CHLORHEXIDINE  GLUCONATE (CHG) Soap  Chlorhexidine  Gluconate (CHG) Soap  o An antiseptic cleaner that kills germs and bonds with the skin to continue killing germs even after washing  o Used for showering the night before surgery and morning of surgery  Before surgery, you can play an important role by reducing the number of germs on your skin.  CHG (Chlorhexidine  gluconate) soap is an antiseptic cleanser which kills germs and bonds with the skin to continue killing germs even after washing.  Please do not use if you have an allergy to CHG or antibacterial soaps. If your skin becomes reddened/irritated stop using the CHG.  1. Shower the NIGHT BEFORE SURGERY and the MORNING OF SURGERY with CHG soap.  2. If you choose to wash your hair, wash your hair first as usual with your normal shampoo.  3.  After shampooing, rinse your hair and body thoroughly to remove the shampoo.  4. Use CHG as you would any other liquid soap. You can apply CHG directly to the skin and wash gently with a scrungie or a clean washcloth.  5. Apply the CHG soap to your body only from the neck down. Do not use on open wounds or open sores. Avoid contact with your eyes, ears, mouth, and genitals (private parts). Wash face and genitals (private parts) with your normal soap.  6. Wash thoroughly, paying special attention to the area where your surgery will be performed.  7. Thoroughly rinse your body with warm water.  8. Do not shower/wash with your normal soap after using and rinsing off the CHG soap.  9. Pat yourself dry with a clean towel.  10. Wear clean pajamas to bed the night before surgery.  12. Place clean sheets on your bed the night of your first shower and do not sleep with pets.  13. Shower again with the CHG soap on the day of surgery prior to arriving at  the hospital.  14. Do not apply any deodorants/lotions/powders.  15. Please wear clean clothes to the hospital.

## 2024-02-19 ENCOUNTER — Other Ambulatory Visit: Payer: Self-pay | Admitting: Cardiology

## 2024-02-19 ENCOUNTER — Encounter: Payer: Self-pay | Admitting: Vascular Surgery

## 2024-02-19 DIAGNOSIS — I4819 Other persistent atrial fibrillation: Secondary | ICD-10-CM

## 2024-02-19 DIAGNOSIS — I5020 Unspecified systolic (congestive) heart failure: Secondary | ICD-10-CM

## 2024-02-19 DIAGNOSIS — I2089 Other forms of angina pectoris: Secondary | ICD-10-CM

## 2024-02-19 DIAGNOSIS — R079 Chest pain, unspecified: Secondary | ICD-10-CM

## 2024-02-19 DIAGNOSIS — R0602 Shortness of breath: Secondary | ICD-10-CM

## 2024-02-19 DIAGNOSIS — I10 Essential (primary) hypertension: Secondary | ICD-10-CM

## 2024-02-19 NOTE — Progress Notes (Signed)
 Perioperative Services Pre-Admission/Anesthesia Testing   Date: 02/19/24 Name: Mark Nash MRN:   [id number]  Re: Consideration of preoperative prophylactic antibiotic change   Request sent to: Marea Selinda RAMAN, MD (routed and/or faxed via [facility])   Delores Pickles, NP-C (routed and/or faxed via [facility])  Planned Surgical Procedure(s):     Case: [id number] Date/Time: 02/20/24 0715   Procedures:      ENDARTERECTOMY, FEMORAL (Bilateral)     CREATION, BYPASS, ARTERIAL, POPLITEAL TO TIBIAL, USING GRAFT (Right)     APPLICATION OF CELL SAVER   Anesthesia type: General   Diagnosis: Atherosclerosis of lower extremity with claudication (HCC) [I70.219]   Pre-op diagnosis: ASO WITH CLAUDICATION   Location: [facility] OR ROOM 08 / [facility]   Surgeons: Marea Selinda RAMAN, MD        Clinical Notes:  Patient has a documented allergy/intolerance to PCN  Advising that PCN has caused him to experience urticarial rash and swelling as a child  EMR review indicated that patient received PCN and/or cephalosporin in the past as follows: CEFTRIAXONE  received with no documented ADRs. Allergy section in EMR updated to reflect tolerance.    Screened as appropriate for cephalosporin use during medication reconciliation No immediate angioedema, dysphagia, SOB, anaphylaxis symptoms. No severe rash involving mucous membranes or skin necrosis. No hospital admissions related to side effects of PCN/cephalosporin use.  No documented reaction to PCN or cephalosporin in the last 10 years.  Request:  As an evidence based approach to reducing the rate of incidence for post-operative SSI and the development of MDROs, could an agent that allows for narrower antimicrobial coverage for preoperative prophylaxis in this patient's upcoming surgical course be considered?   Currently ordered preoperative prophylactic ABX: vancomycin .   Specifically requesting change to cephalosporin (CEFAZOLIN ).   Drug of choice for  many procedures; it is the most widely studied antimicrobial agent with proven efficacy for antimicrobial prophylaxis.   Desirable duration of action, spectrum of activity against organisms commonly encountered in surgery, and it has an excellent safety profile and low cost.   Active against streptococci, methicillin-susceptible staphylococci, and many gram-negative organisms.  Despite being a first-generation cephalosporin, cefazolin  is structurally different than other cephalosporins. Cefazolin  has two side chains (R1 and R2) that have structures that do not match those of any other FDA-approved ?-lactams or PCN.  True allergies to cefazolin  are extremely rare (<1%) and are usually specific for its side chains, not the shared core ring.  Please communicate decision with me and I will change the orders in Epic as per your direction.    Things to consider: Many patients report that they were allergic to PCN earlier in life, however this does not translate into a true lifelong allergy. Patients can lose sensitivity to specific IgE antibodies over time if PCN is avoided (Kleris & Lugar, 2019).  Penicillin allergies are reported by 8% to 15% of the [place]  population, but up to 95% of these allergies do not correspond to a true allergy when tested Mathias et al., 2022).   Cross-sensitivity between PCN and cephalosporins has been documented as being as high as 10%, however this estimation included data believed to have been collected in a setting where there was contamination. Newer data suggests that the prevalence of cross-sensitivity between PCN and cephalosporins is actually estimated to be closer to 1% (Hermanides et al., 2018).   Patients labeled as PCN allergic, whether they are truly allergic or not, have been found to have inferior outcomes in  terms of rates of serious infection, and these patients tend to have longer hospital stays [facility] & Lugar, 2019).  Treatment related secondary  infections, such as Clostridioides difficile, have been linked to the improper use of broad spectrum antibiotics in patients improperly labeled as PCN allergic (Kleris & Lugar, 2019).  Anaphylaxis from cephalosporins is rare and the evidence suggests that there is no increased risk of an anaphylactic type reaction when cephalosporins are used in a PCN allergic patient (Pichichero, 2006).  Citations: Hermanides J, Lemkes BA, Prins ONA Dose MW, Terreehorst I. Presumed ?-Lactam Allergy and Cross-reactivity in the Operating Theater: A Practical Approach. Anesthesiology. 2018 Aug;129(2):335-342. doi: 10.1097/ALN.0000000000002252. PMID: 70237819.  Kleris, R. S., & Lugar, P. L. (2019). Things We Do For No Reason: Failing to Question a Penicillin Allergy History. Journal of hospital medicine, 14(10), [phone]. Advance online publication. airportbarriers.com  Pichichero, M. E. (2006). Cephalosporins can be prescribed safely for penicillin-allergic patients. Journal of family medicine, 55(2), 106-112. Accessed: https://cdn.mdedge.com/files/s34fs-public/Document/September-2017/5502JFP_AppliedEvidence1.pdf  Mikki CANDIE Bethena KYM RONAL Mickey Beverley JUDITHANN DELENA., & Estelle, D. R. (2022). Understanding Penicillin Allergy, Cross-reactivity, and Antibiotic Selection in the Preoperative Setting. The Journal of the American Academy of Orthopaedic Surgeons, 30(1), e1-e5. CallRank.tn   Dorise Pereyra, MSN, APRN, FNP-C, CEN [facility]  Perioperative Services Nurse Practitioner Phone: [phone] Fax: [phone] 02/19/24 9:25 AM  NOTE: This note has been prepared using Dragon dictation software. Despite my best ability to proofread, there is always the potential that unintentional transcriptional errors may still occur from this process.

## 2024-02-19 NOTE — Progress Notes (Signed)
 Perioperative / Anesthesia Services  Pre-Admission Testing Clinical Review / Pre-Operative Anesthesia Consult  Date: 02/19/24  PATIENT DEMOGRAPHICS: Name: Mark Nash DOB: [birth year] MRN:   [id number]  Note: Available PAT nursing documentation and vital signs have been reviewed. Clinical nursing staff has updated patient's PMH/PSHx, current medication list, and drug allergies/intolerances to ensure complete and comprehensive history available to assist care teams in MDM as it pertains to the aforementioned surgical procedure and anticipated anesthetic course. Extensive review of available clinical information personally performed. [place] [facility]H and PSHx updated with any diagnoses/procedures that  may have been inadvertently omitted during his intake with the pre-admission testing department's nursing staff.  PLANNED SURGICAL PROCEDURE(S):   Case: [id number] Date/Time: 02/20/24 0715   Procedures:      ENDARTERECTOMY, FEMORAL (Bilateral)     CREATION, BYPASS, ARTERIAL, POPLITEAL TO TIBIAL, USING GRAFT (Right)     APPLICATION OF CELL SAVER   Anesthesia type: General   Diagnosis: Atherosclerosis of lower extremity with claudication (HCC) [I70.219]   Pre-op diagnosis: ASO WITH CLAUDICATION   Location: [facility] OR ROOM 08 / [facility]   Surgeons: Marea Selinda RAMAN, MD        CLINICAL DISCUSSION: Mark Nash is a 68 y.o. male who is submitted for pre-surgical anesthesia review and clearance prior to him undergoing the above procedure. Patient is a Current Smoker (69 pack years). Pertinent PMH includes: CAD, NICM, HFrEF, atrial fibrillation/flutter, pulmonary hypertension, PVD, aortic atherosclerosis, aortic root dilatation, HTN, HLD, T2DM, asthma, COPD (continues to smoke), GERD (on daily PPI), thrombocytopenia, untreated HCV (remote IVDU), nephrolithiasis, RLS, OA, lumbar DDD (s/p L3-L5 laminectomy/decompression), schizoaffective disorder (refuses to consult with  psychiatry).  Patient is followed by cardiology (Gollan, MD). He was last seen in the cardiology clinic on 12/24/2023; notes reviewed. At the time of his clinic visit, patient doing well overall from a cardiovascular perspective. Patient denied any chest pain, shortness of breath, PND, orthopnea, palpitations, significant peripheral edema, weakness, fatigue, vertiginous symptoms, or presyncope/syncope. Patient with a past medical history significant for cardiovascular diagnoses. Documented physical exam was grossly benign, providing no evidence of acute exacerbation and/or decompensation of the patient's known cardiovascular conditions.  Most recent myocardial perfusion imaging study was performed on 01/01/2023 revealing a moderately reduced left ventricular systolic function with an EF of 35-45%.  There were no regional wall motion abnormalities.  No artifact or left ventricular cavity size enlargement appreciated on review of imaging. SPECT images demonstrated a small in size, mild in severity, fixed apical anterior and apical defect most consistent with artifact but cannot rule out an element of scar. Sensitivity and specificity of the study are significant reduced due to artifact. The resting images are uninterpretable due to severe extracardiac activity. TID ratio = 0.88. Study determined to be probably low risk.  Patient underwent diagnostic RIGHT/LEFT heart catheterization on 01/29/2023.  Study revealed mild diffuse disease throughout the LAD, LCx, and RCA.  There was no evidence of any obstructive lesions.  Hemodynamics: mean RA = 1 mmHg, mean PA = 20 mmHg, mean PCWP = 8 mmHg, LVEDP = 8 mmHg, CO = 4.75 L/min, and CI 2.61 L/min/m.  Findings consistent with patient's known nonischemic cardiomyopathy.  Etiology felt to be potentially tachycardia induced.  Medical management was recommended.  Most recent TTE performed on 05/09/2023 revealed a normal left ventricular systolic function with an EF of  50-55%. There were no regional wall motion abnormalities.  Left ventricular diastolic Doppler parameters were normal. Right ventricular size and  function normal. RVSP = 21.6 mmHg.  There was mild to moderate tricuspid valve regurgitation.  All transvalvular gradients were noted to be normal providing no evidence of hemodynamically significant valvular stenosis. Aorta normal in size with no evidence of ectasia or aneurysmal dilatation.  Patient with an atrial fibrillation/flutter diagnosis; CHA2DS2-VASc Score = 5 (age, HFrEF, HTN, vascular disease, T2DM).patient underwent DCCV procedure on 05/20/2021, at which time he received a total of 4 synchronized cardioversions (150 J x 1 and 200 J x 3).  Of note, following the initial 200 J cardioversion, patient briefly converted to NSR, however there was ERAF after less than 1 minute.  Rate and rhythm currently being maintained on oral digoxin  + metoprolol  succinate.  Patient is chronically anticoagulated using DOAC (apixaban ).  Patient is reportedly compliant with therapy with no evidence reports of GI/GU related bleeding.  Blood pressure reasonably controlled at 140/76 mmHg on currently prescribed ARB (losartan ), beta-blocker (metoprolol  succinate), and diuretic (torsemide ) therapies. He is on atorvastatin for his HLD diagnosis and further ASCVD prevention. T2DM poorly controlled on currently prescribed regimen.  His most recent hemoglobin A1c was 10.1% when checked on 11/19/2023. Patient is able to complete all of his  ADL/IADLs without cardiovascular limitation.  Per the DASI, patient is able to achieve at least 4 METS of physical activity without experiencing any significant degree of angina/anginal equivalent symptoms. No changes were made to his medication regimen during his visit with cardiology.  Patient scheduled to follow-up with outpatient cardiology in 6 months or sooner if needed.  Mark Nash is scheduled for an elective ENDARTERECTOMY, FEMORAL  (Bilateral); CREATION, BYPASS, ARTERIAL, POPLITEAL TO TIBIAL, USING GRAFT (Right); APPLICATION OF CELL SAVER on 02/20/2024 with Dr. Selinda Gu, MD.  Given patient's past medical history significant for cardiovascular diagnoses, presurgical cardiac clearance was sought by the PAT team.  Per cardiology, he is largely sedentary at baseline though can achieve 4 METs throughout the daily/household activities. There is an estimated rate of 15% for adverse cardiac event in the periprocedural timeframe. Given he is without symptoms of angina or cardiac decompensation, and in the context of cardiac cath 10 months ago showing nonobstructive disease and most recent echo showing improvement in LV systolic function, no further cardiac testing will further mitigate this risk.  Again, this patient is on daily oral anticoagulation therapy using a DOAC medication.  He has been instructed on recommendations for holding his apixaban  for 2 days prior to his procedure with plans to restart as soon as postoperative bleeding risk felt to be minimized by his primary attending surgeon. The patient has been instructed that his last dose of should be on 02/17/2024.  Patient denies previous perioperative complications with anesthesia in the past. In review his EMR, it is noted that patient underwent a MAC anesthetic course at [facility] ([facility]) in 10/2021 without documented complications.   MOST RECENT VITAL SIGNS:    02/07/2024    9:21 AM 01/01/2024    3:26 PM 12/25/2023    3:38 PM  Vitals with BMI  Height 5' 11 5' 11   Weight 149 lbs 11 oz 149 lbs 2 oz   BMI 20.89 20.8   Systolic 148 114 869  Diastolic 79 74 70  Pulse 44 98    PROVIDERS/SPECIALISTS: NOTE: Primary physician provider listed below. Patient may have been seen by APP or partner within same practice.   PROVIDER ROLE / SPECIALTY LAST Mark Gu Selinda GORMAN, MD Vascular Surgery (Surgeon) 10/09/2023  Sowles, Krichna,  MD Primary Care Provider 01/01/2024   Perla Lye, MD Cardiology 12/24/2023   ALLERGIES: Allergies  Allergen Reactions   Penicillins Hives and Swelling    Childhood allergy.  Has tolerated 3rd generation cephalosporin (CEFTRIAXONE ) without documented ADRs   Aspirin  Nausea Only   Ibuprofen Nausea Only   Lyrica  [Pregabalin ] Nausea Only   Acetaminophen  Nausea Only    CURRENT HOME MEDICATIONS: No current facility-administered medications for this encounter.    apixaban  (ELIQUIS ) 5 MG TABS tablet   blood glucose meter kit and supplies KIT   Blood Glucose Monitoring Suppl (ONETOUCH VERIO FLEX SYSTEM) w/Device KIT   Budeson-Glycopyrrol-Formoterol  (BREZTRI  AEROSPHERE) 160-9-4.8 MCG/ACT AERO   dapagliflozin  propanediol (FARXIGA ) 10 MG TABS tablet   digoxin  (LANOXIN ) 0.125 MG tablet   gabapentin  (NEURONTIN ) 300 MG capsule   glucose blood (ONETOUCH VERIO) test strip   Insulin  Degludec (TRESIBA ) 100 UNIT/ML SOLN   Lancet Devices MISC   Lancets (ONETOUCH DELICA PLUS LANCET30G) MISC   losartan  (COZAAR ) 25 MG tablet   metoprolol  (TOPROL -XL) 200 MG 24 hr tablet   omeprazole  (PRILOSEC) 20 MG capsule   potassium chloride  SA (KLOR-CON  M) 20 MEQ tablet   pravastatin  (PRAVACHOL ) 20 MG tablet   Semaglutide ,0.25 or 0.5MG /DOS, (OZEMPIC , 0.25 OR 0.5 MG/DOSE,) 2 MG/1.5ML SOPN   tiZANidine (ZANAFLEX) 4 MG tablet   torsemide  (DEMADEX ) 20 MG tablet   traMADol  (ULTRAM ) 50 MG tablet   triamcinolone  cream (KENALOG ) 0.1 %   Vitamin D , Ergocalciferol , (DRISDOL ) 1.25 MG (50000 UNIT) CAPS capsule   HISTORY: Past Medical History:  Diagnosis Date   Aortic atherosclerosis (HCC)    Aortic root dilatation (HCC)    Arthritis    Asthma    Atrial fibrillation and flutter (HCC)    a.) CHA2DS2VASc = 5 (age, HFrEF, HTN, vascular disease, T2DM) as of 02/19/2024; b.) s/p DCCV 05/20/2021 --> 150 J x 1 and 200 J x 3 (converted to NSR after 1st 200 J x 1 min with ERAF); c.) rate/rhythm maintained on oral digoxin  + metoprolol  succinate; chronically  anticoagulated with apixaban    Calcaneal spur    Colon polyps    Constipation    COPD (chronic obstructive pulmonary disease) (HCC)    Coronary artery disease    DDD (degenerative disc disease), lumbar    a.) s/p L3-L5 laminectomy/decompression in 2017   Diverticulosis    Dyspnea    GERD (gastroesophageal reflux disease)    Hepatitis C    a.) has not been Tx'd; b.) endorses IVDU ages [age]   HFrEF (heart failure with reduced ejection fraction) (HCC)    History of intravenous drug abuse    a.) age [age]   History of kidney stones    History of right cataract extraction    Hyperlipidemia    Hypertension 06/10/2018   Left inguinal hernia    NICM (nonischemic cardiomyopathy) (HCC)    On apixaban  therapy    Peripheral vascular disease (HCC)    Pulmonary hypertension (HCC)    Restless legs    Schizoaffective disorder (HCC)    a.) refuses to consult with psychiatry per PCP   Status post bilateral inguinal hernia repair    T2DM (type 2 diabetes mellitus) (HCC)    Thrombocytopenia (HCC)    Vitamin D  deficiency    Wears full dentures    Past Surgical History:  Procedure Laterality Date   CARDIOVERSION N/A 05/20/2021   Procedure: CARDIOVERSION;  Surgeon: Darron Deatrice LABOR, MD;  Location: [facility] ORS;  Service: Cardiovascular;  Laterality: N/A;   CATARACT EXTRACTION  W/PHACO Right 10/31/2021   Procedure: CATARACT EXTRACTION PHACO AND INTRAOCULAR LENS PLACEMENT (IOC) RIGHT DIABETIC;  Surgeon: Myrna Adine Anes, MD;  Location[facility];  Service: Ophthalmology;  Laterality: Right;  Diabetic 4.34 00:42.8   COLONOSCOPY WITH PROPOFOL  N/A 09/07/2017   Procedure: COLONOSCOPY WITH PROPOFOL ;  Surgeon: Janalyn Keene NOVAK, MD;  Location: [facility] ENDOSCOPY;  Service: Endoscopy;  Laterality: N/A;   ESOPHAGOGASTRODUODENOSCOPY (EGD) WITH PROPOFOL  N/A 12/07/2017   Procedure: ESOPHAGOGASTRODUODENOSCOPY (EGD) WITH PROPOFOL ;  Surgeon: Janalyn Keene NOVAK, MD;  Location: [facility] ENDOSCOPY;   Service: Endoscopy;  Laterality: N/A;   FLEXIBLE SIGMOIDOSCOPY N/A 11/09/2017   Procedure: FLEXIBLE SIGMOIDOSCOPY;  Surgeon: Janalyn Keene NOVAK, MD;  Location: [facility] ENDOSCOPY;  Service: Endoscopy;  Laterality: N/A;   INGUINAL HERNIA REPAIR Bilateral 06/21/2018   Procedure: LAPAROSCOPIC BILATERAL INGUINAL HERNIA REPAIR;  Surgeon: Nicholaus Selinda Birmingham, MD;  Location: [facility] ORS;  Service: General;  Laterality: Bilateral;   L3 TO L5 LAMINECTOMY FOR DECOMPRESSION  07/17/2016   [facility]   LOWER EXTREMITY ANGIOGRAPHY Right 11/05/2023   Procedure: Lower Extremity Angiography;  Surgeon: Marea Selinda RAMAN, MD;  Location: [facility];  Service: Cardiovascular;  Laterality: Right;   REPAIR OF CEREBROSPINAL FLUID LEAK N/A 09/08/2016   Procedure: Lumbar wound exploration, repair of pseudomenigocele;  Surgeon: Morene Hicks Ditty, MD;  Location[facility] OR;  Service: Neurosurgery;  Laterality: N/A;  Lumbar wound exploration, repair of pseudomenigocele   RIGHT/LEFT HEART CATH AND CORONARY ANGIOGRAPHY N/A 01/29/2023   Procedure: RIGHT/LEFT HEART CATH AND CORONARY ANGIOGRAPHY;  Surgeon: Darron Deatrice LABOR, MD;  Location: [facility] INVAS[facility] CV LAB;  Service: Cardiovascular;  Laterality: N/A;   SINUSOTOMY     as a child   Family History  Problem Relation Age of Onset   Heart failure Mother    Hypertension Mother    Diverticulitis Mother    Colitis Mother    Diabetes Father    Hypertension Father    Heart disease Father    Diabetes Sister    Heart failure Brother    Diverticulitis Brother    Social History   Tobacco Use   Smoking status: Every Day    Current packs/day: 1.50    Average packs/day: 1.5 packs/day for 46.0 years (69.0 ttl pk-yrs)    Types: Cigarettes   Smokeless tobacco: Never  Substance Use Topics   Alcohol use: No    Alcohol/week: 0.0 standard drinks of alcohol   LABS:  Lab Results  Component Value Date   WBC 6.6 12/24/2023   HGB 16.7 12/24/2023   HCT 50.8  12/24/2023   MCV 91 12/24/2023   PLT 171 12/24/2023   Lab Results  Component Value Date   NA 139 12/24/2023   CL 101 12/24/2023   K 4.9 12/24/2023   CO2 27 12/24/2023   BUN 14 12/24/2023   CREATININE 1.00 12/24/2023   EGFR 82 12/24/2023   CALCIUM 9.7 12/24/2023   PHOS 4.5 01/31/2023   ALBUMIN 4.0 02/27/2023   GLUCOSE 94 12/24/2023   Hospital Outpatient Visit on 02/07/2024  Component Date Value Ref Range Status   MRSA, PCR 02/07/2024 NEGATIVE  NEGATIVE Final   Staphylococcus aureus 02/07/2024 NEGATIVE  NEGATIVE Final   Comment: (NOTE) The Xpert SA Assay (FDA approved for NASAL specimens in patients 23 years of age and older), is one component of a comprehensive surveillance program. It is not intended to diagnose infection nor to guide or monitor treatment. Performed at [facility], [address], [place], [place] [address]  ABO/RH(D) 02/07/2024 A POS   Final   Antibody Screen 02/07/2024 NEG   Final   Sample Expiration 02/07/2024 02/21/2024,2359   Final   Extend sample reason 02/07/2024    Final                   Value:NO TRANSFUSIONS OR PREGNANCY IN THE PAST 3 MONTHS Performed at [facility], [address] [place], [place] [address]     ECG: Date: 12/24/2023  Time ECG obtained: 1506 PM Rate: 88 bpm Rhythm: Atrial flutter with variable AV block Axis (leads I and aVF): normal Intervals: QRS 84 ms. QTc 392 ms. ST segment and T wave changes: Nonspecific ST abnormality Evidence of a possible, age undetermined, prior infarct:  No Comparison: Similar to previous tracing obtained on 06/25/2023   IMAGING / PROCEDURES: VAS US  ABI WITH/WO TBI performed on 09/28/2023 Resting right ankle-brachial index indicates moderate right lower extremity arterial disease. The right toe-brachial index is abnormal.  Resting left ankle-brachial index is within normal range. The left toe-brachial index is normal.    ECHOCARDIOGRAM LIMITED performed on  05/09/2023 Left ventricular ejection fraction, by estimation, is 50 to 55%. The left ventricle has low normal function. The left ventricle has no regional wall motion abnormalities. Left ventricular diastolic parameters are indeterminate.  Right ventricular systolic function is normal. The right ventricular size is normal. There is normal pulmonary artery systolic pressure. The estimated right ventricular systolic pressure is 21.6 mmHg.  The mitral valve is normal in structure. No evidence of mitral valve regurgitation. No evidence of mitral stenosis.  Tricuspid valve regurgitation is mild to moderate.  The aortic valve is normal in structure. Aortic valve regurgitation is not visualized. No aortic stenosis is present.  The inferior vena cava is normal in size with greater than 50% respiratory variability, suggesting right atrial pressure of 3 mmHg.   RIGHT/LEFT HEART CATHETERIZATION performed on 01/29/2023 Mildly calcified coronary arteries with mild nonobstructive coronary artery disease. Mild diffuse disease throughout the LAD, LCx, and RCA Left ventricular angiography was not performed.  EF was moderately reduced by echo. Right heart catheterization showed normal filling pressures, mild pulmonary hypertension and normal cardiac output. Mean RA = 1 mmHg Mean PA = 20 mmHg Mean PCWP = 8 mmHg LVEDP = 6 mmHg CO = 4.75 L/min CI = 2.61 L/min/m Recommendations: The patient has nonischemic cardiomyopathy likely tachycardia induced.   Recommend medical therapy. Continue Toprol  for anticoagulation. Resume heparin  2 hours after sheath pull and likely can transition to Eliquis  later this evening.  MYOCARDIAL PERFUSION IMAGING STUDY (LEXISCAN ) performed on 01/01/2023 Moderately reduced ventricular systolic function with EF 35-25% Sensitivity and specificity of the study are significantly reduced due to artifact.  The resting images are uninterpretable due to severe extracardiac activity. Small in  size, mild in severity, mixed apical anterior and apical defect most consistent with artifact, however cannot rule out an element of scar. No evidence of significant ischemia Coronary artery calcification and aortic atherosclerosis present Abnormal, probably low risk pharmacological myocardial scan   IMPRESSION AND PLAN: Mark Nash has been referred for pre-anesthesia review and clearance prior to him undergoing the planned anesthetic and procedural courses. Available labs, pertinent testing, and imaging results were personally reviewed by me in preparation for upcoming operative/procedural course. [facility] medical record has been updated following extensive record review and patient interview with PAT staff.   This patient has been appropriately cleared by cardiology with an overall ACCEPTABLE  risk of patient experiencing significant  perioperative cardiovascular complications. Based on clinical review performed today (02/19/24), barring any significant acute changes in the patient's overall condition, it is anticipated that he will be able to proceed with the planned surgical intervention. Any acute changes in clinical condition may necessitate his procedure being postponed and/or cancelled. Patient will meet with anesthesia team (MD and/or CRNA) on the day of his procedure for preoperative evaluation/assessment. Questions regarding anesthetic course will be fielded at that time.   Pre-surgical instructions were reviewed with the patient during his PAT appointment, and questions were fielded to satisfaction by PAT clinical staff. He has been instructed on which medications that he will need to hold prior to surgery, as well as the ones that have been deemed safe/appropriate to take on the day of his procedure. As part of the general education provided by PAT, patient made aware both verbally and in writing, that he would need to abstain from the use of any illegal substances during his  perioperative course. He was advised that failure to follow the provided instructions could necessitate case cancellation or result in serious perioperative complications up to and including death. Patient encouraged to contact PAT and/or his surgeon's office to discuss any questions or concerns that may arise prior to surgery; verbalized understanding.   Dorise Pereyra, MSN, APRN, FNP-C, CEN [facility]  Perioperative Services Nurse Practitioner Phone: [phone] Fax: [phone] 02/19/24 1:28 PM  NOTE: This note has been prepared using Dragon dictation software. Despite my best ability to proofread, there is always the potential that unintentional transcriptional errors may still occur from this process.

## 2024-02-19 NOTE — Progress Notes (Addendum)
  Perioperative Services Pre-Admission/Anesthesia Testing     Date: 02/19/24  Name: Mark Nash MRN:   [id number]  Re: Change in ABX for upcoming surgery    Case: [id number] Date/Time: 02/20/24 0715   Procedures:      ENDARTERECTOMY, FEMORAL (Bilateral)     CREATION, BYPASS, ARTERIAL, POPLITEAL TO TIBIAL, USING GRAFT (Right)     APPLICATION OF CELL SAVER   Anesthesia type: General   Diagnosis: Atherosclerosis of lower extremity with claudication (HCC) [I70.219]   Pre-op diagnosis: ASO WITH CLAUDICATION   Location: [facility] OR ROOM 08 / [facility]   Surgeons: Marea Selinda RAMAN, MD        Primary attending surgeon was consulted regarding consideration of therapeutic change in antimicrobial agent being used for preoperative prophylaxis in this patient's upcoming surgical case. Following analysis of the risk versus benefits, the patient's primary attending surgeon advised that it would be acceptable to discontinue the ordered vancomycin  and place an order for cefazolin  2 gm IV to be given on call to the OR. Orders for this patient were amended by me following collaborative conversation with attending surgeon taking into consideration of risk versus benefits associated with the change in therapy.  Dorise Pereyra, MSN, APRN, FNP-C, CEN [facility]  Perioperative Services Nurse Practitioner Phone: [phone] Fax: [phone] 02/19/24 1:12 PM  NOTE: This note has been prepared using Dragon dictation software. Despite my best ability to proofread, there is always the potential that unintentional transcriptional errors may still occur from this process.

## 2024-02-20 ENCOUNTER — Encounter: Payer: Self-pay | Admitting: Vascular Surgery

## 2024-02-20 ENCOUNTER — Inpatient Hospital Stay: Payer: Self-pay | Admitting: Urgent Care

## 2024-02-20 ENCOUNTER — Inpatient Hospital Stay

## 2024-02-20 ENCOUNTER — Inpatient Hospital Stay
Admission: RE | Admit: 2024-02-20 | Discharge: 2024-02-24 | DRG: 253 | Disposition: A | Discharge status: Home Health | Attending: Vascular Surgery | Admitting: Vascular Surgery

## 2024-02-20 ENCOUNTER — Other Ambulatory Visit: Payer: Self-pay

## 2024-02-20 ENCOUNTER — Encounter
Admission: RE | Disposition: A | Discharge status: Home Health | Payer: Self-pay | Source: Home / Self Care | Attending: Vascular Surgery

## 2024-02-20 DIAGNOSIS — I420 Dilated cardiomyopathy: Secondary | ICD-10-CM | POA: Diagnosis present

## 2024-02-20 DIAGNOSIS — F17210 Nicotine dependence, cigarettes, uncomplicated: Secondary | ICD-10-CM | POA: Diagnosis present

## 2024-02-20 DIAGNOSIS — Z794 Long term (current) use of insulin: Secondary | ICD-10-CM | POA: Diagnosis not present

## 2024-02-20 DIAGNOSIS — I10 Essential (primary) hypertension: Secondary | ICD-10-CM | POA: Diagnosis not present

## 2024-02-20 DIAGNOSIS — I70248 Atherosclerosis of native arteries of left leg with ulceration of other part of lower left leg: Secondary | ICD-10-CM

## 2024-02-20 DIAGNOSIS — Z880 Allergy status to penicillin: Secondary | ICD-10-CM

## 2024-02-20 DIAGNOSIS — Z833 Family history of diabetes mellitus: Secondary | ICD-10-CM

## 2024-02-20 DIAGNOSIS — Z604 Social exclusion and rejection: Secondary | ICD-10-CM | POA: Diagnosis present

## 2024-02-20 DIAGNOSIS — M1990 Unspecified osteoarthritis, unspecified site: Secondary | ICD-10-CM | POA: Diagnosis not present

## 2024-02-20 DIAGNOSIS — E1142 Type 2 diabetes mellitus with diabetic polyneuropathy: Secondary | ICD-10-CM | POA: Diagnosis present

## 2024-02-20 DIAGNOSIS — Z886 Allergy status to analgesic agent status: Secondary | ICD-10-CM

## 2024-02-20 DIAGNOSIS — Z7985 Long-term (current) use of injectable non-insulin antidiabetic drugs: Secondary | ICD-10-CM

## 2024-02-20 DIAGNOSIS — E785 Hyperlipidemia, unspecified: Secondary | ICD-10-CM | POA: Diagnosis not present

## 2024-02-20 DIAGNOSIS — I70219 Atherosclerosis of native arteries of extremities with intermittent claudication, unspecified extremity: Secondary | ICD-10-CM | POA: Diagnosis present

## 2024-02-20 DIAGNOSIS — L97909 Non-pressure chronic ulcer of unspecified part of unspecified lower leg with unspecified severity: Principal | ICD-10-CM | POA: Diagnosis present

## 2024-02-20 DIAGNOSIS — Z8249 Family history of ischemic heart disease and other diseases of the circulatory system: Secondary | ICD-10-CM

## 2024-02-20 DIAGNOSIS — Z7984 Long term (current) use of oral hypoglycemic drugs: Secondary | ICD-10-CM

## 2024-02-20 DIAGNOSIS — I110 Hypertensive heart disease with heart failure: Secondary | ICD-10-CM | POA: Diagnosis not present

## 2024-02-20 DIAGNOSIS — Z8719 Personal history of other diseases of the digestive system: Secondary | ICD-10-CM

## 2024-02-20 DIAGNOSIS — J4489 Other specified chronic obstructive pulmonary disease: Secondary | ICD-10-CM | POA: Diagnosis not present

## 2024-02-20 DIAGNOSIS — E1151 Type 2 diabetes mellitus with diabetic peripheral angiopathy without gangrene: Secondary | ICD-10-CM | POA: Diagnosis not present

## 2024-02-20 DIAGNOSIS — L97811 Non-pressure chronic ulcer of other part of right lower leg limited to breakdown of skin: Secondary | ICD-10-CM | POA: Diagnosis present

## 2024-02-20 DIAGNOSIS — L97821 Non-pressure chronic ulcer of other part of left lower leg limited to breakdown of skin: Secondary | ICD-10-CM | POA: Diagnosis not present

## 2024-02-20 DIAGNOSIS — F259 Schizoaffective disorder, unspecified: Secondary | ICD-10-CM | POA: Diagnosis not present

## 2024-02-20 DIAGNOSIS — I2510 Atherosclerotic heart disease of native coronary artery without angina pectoris: Secondary | ICD-10-CM | POA: Diagnosis present

## 2024-02-20 DIAGNOSIS — Z9889 Other specified postprocedural states: Secondary | ICD-10-CM

## 2024-02-20 DIAGNOSIS — I70201 Unspecified atherosclerosis of native arteries of extremities, right leg: Secondary | ICD-10-CM | POA: Diagnosis not present

## 2024-02-20 DIAGNOSIS — L97919 Non-pressure chronic ulcer of unspecified part of right lower leg with unspecified severity: Secondary | ICD-10-CM | POA: Diagnosis not present

## 2024-02-20 DIAGNOSIS — I70238 Atherosclerosis of native arteries of right leg with ulceration of other part of lower right leg: Secondary | ICD-10-CM

## 2024-02-20 DIAGNOSIS — Z8711 Personal history of peptic ulcer disease: Secondary | ICD-10-CM

## 2024-02-20 DIAGNOSIS — I483 Typical atrial flutter: Secondary | ICD-10-CM | POA: Diagnosis present

## 2024-02-20 DIAGNOSIS — I70211 Atherosclerosis of native arteries of extremities with intermittent claudication, right leg: Secondary | ICD-10-CM | POA: Diagnosis not present

## 2024-02-20 DIAGNOSIS — E559 Vitamin D deficiency, unspecified: Secondary | ICD-10-CM | POA: Diagnosis present

## 2024-02-20 DIAGNOSIS — I5022 Chronic systolic (congestive) heart failure: Secondary | ICD-10-CM | POA: Diagnosis present

## 2024-02-20 DIAGNOSIS — I082 Rheumatic disorders of both aortic and tricuspid valves: Secondary | ICD-10-CM | POA: Diagnosis not present

## 2024-02-20 DIAGNOSIS — I25118 Atherosclerotic heart disease of native coronary artery with other forms of angina pectoris: Secondary | ICD-10-CM | POA: Diagnosis not present

## 2024-02-20 DIAGNOSIS — Z7951 Long term (current) use of inhaled steroids: Secondary | ICD-10-CM

## 2024-02-20 DIAGNOSIS — G2581 Restless legs syndrome: Secondary | ICD-10-CM | POA: Diagnosis present

## 2024-02-20 DIAGNOSIS — Z8701 Personal history of pneumonia (recurrent): Secondary | ICD-10-CM

## 2024-02-20 DIAGNOSIS — K219 Gastro-esophageal reflux disease without esophagitis: Secondary | ICD-10-CM | POA: Diagnosis present

## 2024-02-20 DIAGNOSIS — I272 Pulmonary hypertension, unspecified: Secondary | ICD-10-CM | POA: Diagnosis not present

## 2024-02-20 DIAGNOSIS — Z961 Presence of intraocular lens: Secondary | ICD-10-CM | POA: Diagnosis present

## 2024-02-20 DIAGNOSIS — E119 Type 2 diabetes mellitus without complications: Secondary | ICD-10-CM

## 2024-02-20 DIAGNOSIS — I77810 Thoracic aortic ectasia: Secondary | ICD-10-CM | POA: Diagnosis present

## 2024-02-20 DIAGNOSIS — I700 Atherosclerosis of aorta: Secondary | ICD-10-CM | POA: Diagnosis present

## 2024-02-20 DIAGNOSIS — Z881 Allergy status to other antibiotic agents status: Secondary | ICD-10-CM

## 2024-02-20 DIAGNOSIS — Z79899 Other long term (current) drug therapy: Secondary | ICD-10-CM

## 2024-02-20 DIAGNOSIS — I482 Chronic atrial fibrillation, unspecified: Secondary | ICD-10-CM | POA: Diagnosis not present

## 2024-02-20 DIAGNOSIS — Z860100 Personal history of colon polyps, unspecified: Secondary | ICD-10-CM

## 2024-02-20 DIAGNOSIS — I70202 Unspecified atherosclerosis of native arteries of extremities, left leg: Secondary | ICD-10-CM | POA: Diagnosis not present

## 2024-02-20 DIAGNOSIS — I4821 Permanent atrial fibrillation: Secondary | ICD-10-CM | POA: Diagnosis not present

## 2024-02-20 DIAGNOSIS — Z9841 Cataract extraction status, right eye: Secondary | ICD-10-CM

## 2024-02-20 DIAGNOSIS — Z888 Allergy status to other drugs, medicaments and biological substances status: Secondary | ICD-10-CM

## 2024-02-20 DIAGNOSIS — Z87442 Personal history of urinary calculi: Secondary | ICD-10-CM

## 2024-02-20 DIAGNOSIS — M51369 Other intervertebral disc degeneration, lumbar region without mention of lumbar back pain or lower extremity pain: Secondary | ICD-10-CM | POA: Diagnosis present

## 2024-02-20 DIAGNOSIS — Z8619 Personal history of other infectious and parasitic diseases: Secondary | ICD-10-CM

## 2024-02-20 DIAGNOSIS — I70299 Other atherosclerosis of native arteries of extremities, unspecified extremity: Principal | ICD-10-CM | POA: Diagnosis present

## 2024-02-20 DIAGNOSIS — Z8379 Family history of other diseases of the digestive system: Secondary | ICD-10-CM

## 2024-02-20 DIAGNOSIS — L97929 Non-pressure chronic ulcer of unspecified part of left lower leg with unspecified severity: Secondary | ICD-10-CM

## 2024-02-20 DIAGNOSIS — Z7901 Long term (current) use of anticoagulants: Secondary | ICD-10-CM

## 2024-02-20 HISTORY — PX: INTRAVASCULAR STENT INCLUDING PTA AND IMAGING: SHX6671

## 2024-02-20 HISTORY — DX: Unspecified systolic (congestive) heart failure: I50.20

## 2024-02-20 HISTORY — DX: Personal history of other diseases of the digestive system: Z87.19

## 2024-02-20 HISTORY — DX: Cataract extraction status, right eye: Z98.41

## 2024-02-20 HISTORY — DX: Personal history of other specified conditions: Z87.898

## 2024-02-20 HISTORY — DX: Unspecified atrial fibrillation: I48.91

## 2024-02-20 HISTORY — DX: Other intervertebral disc degeneration, lumbar region without mention of lumbar back pain or lower extremity pain: M51.369

## 2024-02-20 HISTORY — DX: Polyp of colon: K63.5

## 2024-02-20 HISTORY — DX: Pulmonary hypertension, unspecified: I27.20

## 2024-02-20 HISTORY — DX: Calcaneal spur, unspecified foot: M77.30

## 2024-02-20 HISTORY — DX: Type 2 diabetes mellitus without complications: E11.9

## 2024-02-20 HISTORY — DX: Other cardiomyopathies: I42.8

## 2024-02-20 HISTORY — PX: LOWER EXTREMITY ANGIOGRAM: SHX5508

## 2024-02-20 HISTORY — DX: Thoracic aortic ectasia: I77.810

## 2024-02-20 HISTORY — PX: ENDARTERECTOMY FEMORAL: SHX5804

## 2024-02-20 HISTORY — PX: APPLICATION OF CELL SAVER: SHX7529

## 2024-02-20 HISTORY — DX: Long term (current) use of anticoagulants: Z79.01

## 2024-02-20 HISTORY — DX: Atherosclerosis of aorta: I70.0

## 2024-02-20 LAB — GLUCOSE, CAPILLARY
Glucose-Capillary: 103 mg/dL — ABNORMAL HIGH (ref 70–99)
Glucose-Capillary: 133 mg/dL — ABNORMAL HIGH (ref 70–99)
Glucose-Capillary: 151 mg/dL — ABNORMAL HIGH (ref 70–99)
Glucose-Capillary: 185 mg/dL — ABNORMAL HIGH (ref 70–99)

## 2024-02-20 LAB — ABO/RH: ABO/RH(D): A POS

## 2024-02-20 SURGERY — ENDARTERECTOMY, FEMORAL
Anesthesia: General | Laterality: Right

## 2024-02-20 MED ORDER — NITROGLYCERIN IN D5W 200-5 MCG/ML-% IV SOLN
INTRAVENOUS | Status: AC
  Filled 2024-02-20: qty 250

## 2024-02-20 MED ORDER — PHENYLEPHRINE 80 MCG/ML (10ML) SYRINGE FOR IV PUSH (FOR BLOOD PRESSURE SUPPORT)
PREFILLED_SYRINGE | INTRAVENOUS | Status: AC
  Filled 2024-02-20: qty 10

## 2024-02-20 MED ORDER — CHLORHEXIDINE GLUCONATE CLOTH 2 % EX PADS
6.0000 | MEDICATED_PAD | Freq: Every day | CUTANEOUS | Status: DC
  Administered 2024-02-21 – 2024-02-24 (×4): 6 via TOPICAL

## 2024-02-20 MED ORDER — PHENOL 1.4 % MT LIQD
1.0000 | OROMUCOSAL | Status: DC | PRN
  Filled 2024-02-20: qty 177

## 2024-02-20 MED ORDER — DOCUSATE SODIUM 100 MG PO CAPS
100.0000 mg | ORAL_CAPSULE | Freq: Every day | ORAL | Status: DC
  Administered 2024-02-21 – 2024-02-24 (×4): 100 mg via ORAL
  Filled 2024-02-20 (×4): qty 1

## 2024-02-20 MED ORDER — ROCURONIUM BROMIDE 100 MG/10ML IV SOLN
INTRAVENOUS | Status: DC | PRN
  Administered 2024-02-20: 60 mg via INTRAVENOUS
  Administered 2024-02-20 (×2): 20 mg via INTRAVENOUS
  Administered 2024-02-20: 10 mg via INTRAVENOUS

## 2024-02-20 MED ORDER — FENTANYL CITRATE (PF) 100 MCG/2ML IJ SOLN
INTRAMUSCULAR | Status: AC
  Filled 2024-02-20: qty 2

## 2024-02-20 MED ORDER — SENNOSIDES-DOCUSATE SODIUM 8.6-50 MG PO TABS
1.0000 | ORAL_TABLET | Freq: Every evening | ORAL | Status: DC | PRN
  Administered 2024-02-23: 1 via ORAL
  Filled 2024-02-20: qty 1

## 2024-02-20 MED ORDER — ORAL CARE MOUTH RINSE: 15.0000 mL | Freq: Once | OROMUCOSAL | Status: AC

## 2024-02-20 MED ORDER — ROCURONIUM BROMIDE 10 MG/ML (PF) SYRINGE
PREFILLED_SYRINGE | INTRAVENOUS | Status: AC
  Filled 2024-02-20: qty 10

## 2024-02-20 MED ORDER — VANCOMYCIN HCL 1 G IV SOLR
INTRAVENOUS | Status: DC | PRN
  Administered 2024-02-20: 1000 mg

## 2024-02-20 MED ORDER — DIGOXIN 125 MCG PO TABS
0.0625 mg | ORAL_TABLET | Freq: Every day | ORAL | Status: DC
  Administered 2024-02-21 – 2024-02-24 (×4): 0.0625 mg via ORAL
  Filled 2024-02-20 (×4): qty 0.5

## 2024-02-20 MED ORDER — GABAPENTIN 300 MG PO CAPS: 300.0000 mg | ORAL_CAPSULE | ORAL | Status: DC

## 2024-02-20 MED ORDER — LOSARTAN POTASSIUM 25 MG PO TABS
12.5000 mg | ORAL_TABLET | Freq: Every day | ORAL | Status: DC
  Administered 2024-02-20 – 2024-02-24 (×5): 12.5 mg via ORAL
  Filled 2024-02-20 (×2): qty 1
  Filled 2024-02-20: qty 0.5
  Filled 2024-02-20: qty 1
  Filled 2024-02-20: qty 0.5

## 2024-02-20 MED ORDER — FENTANYL CITRATE (PF) 100 MCG/2ML IJ SOLN
25.0000 ug | INTRAMUSCULAR | Status: DC | PRN
  Administered 2024-02-20 (×4): 25 ug via INTRAVENOUS
  Administered 2024-02-20: 50 ug via INTRAVENOUS

## 2024-02-20 MED ORDER — CHLORHEXIDINE GLUCONATE 0.12 % MT SOLN
OROMUCOSAL | Status: AC
  Filled 2024-02-20: qty 15

## 2024-02-20 MED ORDER — INSULIN GLARGINE-YFGN 100 UNIT/ML ~~LOC~~ SOLN
8.0000 [IU] | Freq: Every day | SUBCUTANEOUS | Status: DC
  Administered 2024-02-20 – 2024-02-23 (×4): 8 [IU] via SUBCUTANEOUS
  Filled 2024-02-20 (×5): qty 0.08

## 2024-02-20 MED ORDER — SODIUM CHLORIDE 0.9 % IV SOLN: INTRAVENOUS | Status: DC

## 2024-02-20 MED ORDER — POTASSIUM CHLORIDE CRYS ER 20 MEQ PO TBCR: 20.0000 meq | EXTENDED_RELEASE_TABLET | Freq: Every day | ORAL | Status: DC | PRN

## 2024-02-20 MED ORDER — CEFAZOLIN SODIUM-DEXTROSE 2-4 GM/100ML-% IV SOLN
2.0000 g | Freq: Once | INTRAVENOUS | Status: AC
  Administered 2024-02-20 (×2): 2 g via INTRAVENOUS

## 2024-02-20 MED ORDER — ACETAMINOPHEN 10 MG/ML IV SOLN
INTRAVENOUS | Status: DC | PRN
  Administered 2024-02-20: 1000 mg via INTRAVENOUS

## 2024-02-20 MED ORDER — LIDOCAINE HCL (CARDIAC) PF 100 MG/5ML IV SOSY
PREFILLED_SYRINGE | INTRAVENOUS | Status: DC | PRN
  Administered 2024-02-20: 60 mg via INTRAVENOUS

## 2024-02-20 MED ORDER — GENTAMICIN SULFATE 40 MG/ML IJ SOLN
INTRAMUSCULAR | Status: DC | PRN
  Administered 2024-02-20: 160 mg

## 2024-02-20 MED ORDER — MIDAZOLAM HCL 2 MG/2ML IJ SOLN
INTRAMUSCULAR | Status: AC
  Filled 2024-02-20: qty 2

## 2024-02-20 MED ORDER — ONDANSETRON HCL 4 MG/2ML IJ SOLN: 4.0000 mg | Freq: Four times a day (QID) | INTRAMUSCULAR | Status: DC | PRN

## 2024-02-20 MED ORDER — LABETALOL HCL 5 MG/ML IV SOLN
INTRAVENOUS | Status: AC
  Filled 2024-02-20: qty 4

## 2024-02-20 MED ORDER — DOPAMINE-DEXTROSE 3.2-5 MG/ML-% IV SOLN: 3.0000 ug/kg/min | INTRAVENOUS | Status: DC

## 2024-02-20 MED ORDER — DEXAMETHASONE SODIUM PHOSPHATE 10 MG/ML IJ SOLN
INTRAMUSCULAR | Status: AC
  Filled 2024-02-20: qty 1

## 2024-02-20 MED ORDER — NITROGLYCERIN 1 MG/10 ML FOR IR/CATH LAB
INTRA_ARTERIAL | Status: DC | PRN
  Administered 2024-02-20: 400 ug via INTRA_ARTERIAL

## 2024-02-20 MED ORDER — METOPROLOL SUCCINATE ER 50 MG PO TB24
200.0000 mg | ORAL_TABLET | Freq: Every day | ORAL | Status: DC
  Administered 2024-02-20 – 2024-02-24 (×5): 200 mg via ORAL
  Filled 2024-02-20 (×5): qty 4

## 2024-02-20 MED ORDER — VANCOMYCIN HCL 1000 MG IV SOLR
INTRAVENOUS | Status: AC
  Filled 2024-02-20: qty 20

## 2024-02-20 MED ORDER — PHENYLEPHRINE HCL-NACL 20-0.9 MG/250ML-% IV SOLN
INTRAVENOUS | Status: DC | PRN
Start: 2024-02-20 — End: 2024-02-20
  Administered 2024-02-20: 40 ug/min via INTRAVENOUS

## 2024-02-20 MED ORDER — CHLORHEXIDINE GLUCONATE 0.12 % MT SOLN
15.0000 mL | Freq: Once | OROMUCOSAL | Status: AC
  Administered 2024-02-20: 15 mL via OROMUCOSAL

## 2024-02-20 MED ORDER — VASHE WOUND IRRIGATION OPTIME
TOPICAL | Status: DC | PRN
  Administered 2024-02-20: 34 [oz_av]

## 2024-02-20 MED ORDER — PHENYLEPHRINE 80 MCG/ML (10ML) SYRINGE FOR IV PUSH (FOR BLOOD PRESSURE SUPPORT)
PREFILLED_SYRINGE | INTRAVENOUS | Status: DC | PRN
  Administered 2024-02-20 (×2): 80 ug via INTRAVENOUS

## 2024-02-20 MED ORDER — DEXAMETHASONE SODIUM PHOSPHATE 10 MG/ML IJ SOLN
INTRAMUSCULAR | Status: DC | PRN
  Administered 2024-02-20: 10 mg via INTRAVENOUS

## 2024-02-20 MED ORDER — HYDRALAZINE HCL 20 MG/ML IJ SOLN
5.0000 mg | INTRAMUSCULAR | Status: DC | PRN
  Administered 2024-02-20: 5 mg via INTRAVENOUS
  Filled 2024-02-20: qty 1

## 2024-02-20 MED ORDER — MIDAZOLAM HCL 2 MG/2ML IJ SOLN
INTRAMUSCULAR | Status: DC | PRN
  Administered 2024-02-20: 2 mg via INTRAVENOUS

## 2024-02-20 MED ORDER — APIXABAN 5 MG PO TABS
5.0000 mg | ORAL_TABLET | Freq: Two times a day (BID) | ORAL | Status: DC
  Administered 2024-02-21 – 2024-02-24 (×7): 5 mg via ORAL
  Filled 2024-02-20 (×7): qty 1

## 2024-02-20 MED ORDER — FENTANYL CITRATE (PF) 100 MCG/2ML IJ SOLN
INTRAMUSCULAR | Status: DC | PRN
  Administered 2024-02-20 (×4): 25 ug via INTRAVENOUS

## 2024-02-20 MED ORDER — GENTAMICIN SULFATE 40 MG/ML IJ SOLN
INTRAMUSCULAR | Status: AC
  Filled 2024-02-20: qty 4

## 2024-02-20 MED ORDER — HEPARIN 30,000 UNITS/1000 ML (OHS) CELLSAVER SOLUTION
Status: AC
Start: 2024-02-20 — End: 2024-02-20
  Filled 2024-02-20: qty 1000

## 2024-02-20 MED ORDER — SODIUM CHLORIDE 0.9 % IV SOLN
INTRAVENOUS | Status: DC | PRN
  Administered 2024-02-20: 500 mL

## 2024-02-20 MED ORDER — PRAVASTATIN SODIUM 20 MG PO TABS
20.0000 mg | ORAL_TABLET | Freq: Every day | ORAL | Status: DC
  Administered 2024-02-20 – 2024-02-23 (×4): 20 mg via ORAL
  Filled 2024-02-20 (×4): qty 1

## 2024-02-20 MED ORDER — SODIUM CHLORIDE 0.9 % IV SOLN: 500.0000 mL | Freq: Once | INTRAVENOUS | Status: DC | PRN

## 2024-02-20 MED ORDER — DAPAGLIFLOZIN PROPANEDIOL 10 MG PO TABS
10.0000 mg | ORAL_TABLET | Freq: Every day | ORAL | Status: DC
  Administered 2024-02-21 – 2024-02-24 (×4): 10 mg via ORAL
  Filled 2024-02-20 (×4): qty 1

## 2024-02-20 MED ORDER — HEPARIN 30,000 UNITS/1000 ML (OHS) CELLSAVER SOLUTION
Status: AC | PRN
  Administered 2024-02-20: 1

## 2024-02-20 MED ORDER — CHLORHEXIDINE GLUCONATE CLOTH 2 % EX PADS
6.0000 | MEDICATED_PAD | Freq: Once | CUTANEOUS | Status: AC
  Administered 2024-02-20: 6 via TOPICAL

## 2024-02-20 MED ORDER — TIZANIDINE HCL 4 MG PO TABS
4.0000 mg | ORAL_TABLET | Freq: Two times a day (BID) | ORAL | Status: DC
  Administered 2024-02-20 – 2024-02-24 (×8): 4 mg via ORAL
  Filled 2024-02-20 (×8): qty 1

## 2024-02-20 MED ORDER — HYDROMORPHONE HCL 1 MG/ML IJ SOLN
1.0000 mg | Freq: Once | INTRAMUSCULAR | Status: AC | PRN
  Administered 2024-02-20: 1 mg via INTRAVENOUS
  Filled 2024-02-20: qty 1

## 2024-02-20 MED ORDER — SUGAMMADEX SODIUM 200 MG/2ML IV SOLN
INTRAVENOUS | Status: DC | PRN
  Administered 2024-02-20: 140 mg via INTRAVENOUS

## 2024-02-20 MED ORDER — ESMOLOL HCL 100 MG/10ML IV SOLN
INTRAVENOUS | Status: AC
  Filled 2024-02-20: qty 10

## 2024-02-20 MED ORDER — LIDOCAINE HCL (PF) 2 % IJ SOLN
INTRAMUSCULAR | Status: AC
  Filled 2024-02-20: qty 5

## 2024-02-20 MED ORDER — ALUM & MAG HYDROXIDE-SIMETH 200-200-20 MG/5ML PO SUSP: 15.0000 mL | ORAL | Status: DC | PRN

## 2024-02-20 MED ORDER — GUAIFENESIN-DM 100-10 MG/5ML PO SYRP
15.0000 mL | ORAL_SOLUTION | ORAL | Status: DC | PRN
  Administered 2024-02-20 – 2024-02-21 (×4): 15 mL via ORAL
  Filled 2024-02-20 (×4): qty 20

## 2024-02-20 MED ORDER — DROPERIDOL 2.5 MG/ML IJ SOLN
0.6250 mg | Freq: Once | INTRAMUSCULAR | Status: DC | PRN
Start: 2024-02-20 — End: 2024-02-20

## 2024-02-20 MED ORDER — HEPARIN SODIUM (PORCINE) 1000 UNIT/ML IJ SOLN
INTRAMUSCULAR | Status: AC
  Filled 2024-02-20: qty 10

## 2024-02-20 MED ORDER — CEFAZOLIN SODIUM-DEXTROSE 2-4 GM/100ML-% IV SOLN
INTRAVENOUS | Status: AC
  Filled 2024-02-20: qty 100

## 2024-02-20 MED ORDER — LABETALOL HCL 5 MG/ML IV SOLN
10.0000 mg | INTRAVENOUS | Status: AC | PRN
  Administered 2024-02-20 (×4): 10 mg via INTRAVENOUS
  Filled 2024-02-20 (×3): qty 4

## 2024-02-20 MED ORDER — METOPROLOL TARTRATE 5 MG/5ML IV SOLN: 2.0000 mg | INTRAVENOUS | Status: DC | PRN

## 2024-02-20 MED ORDER — FAMOTIDINE IN NACL 20-0.9 MG/50ML-% IV SOLN
20.0000 mg | Freq: Two times a day (BID) | INTRAVENOUS | Status: DC
  Administered 2024-02-20: 20 mg via INTRAVENOUS
  Filled 2024-02-20 (×2): qty 50

## 2024-02-20 MED ORDER — PHENYLEPHRINE HCL-NACL 20-0.9 MG/250ML-% IV SOLN
INTRAVENOUS | Status: AC
  Filled 2024-02-20: qty 250

## 2024-02-20 MED ORDER — HEPARIN SODIUM (PORCINE) 1000 UNIT/ML IJ SOLN
INTRAMUSCULAR | Status: DC | PRN
  Administered 2024-02-20: 2000 [IU] via INTRAVENOUS
  Administered 2024-02-20: 7000 [IU] via INTRAVENOUS

## 2024-02-20 MED ORDER — HEPARIN SODIUM (PORCINE) 5000 UNIT/ML IJ SOLN
INTRAMUSCULAR | Status: AC
  Filled 2024-02-20: qty 1

## 2024-02-20 MED ORDER — ACETAMINOPHEN 10 MG/ML IV SOLN
INTRAVENOUS | Status: AC
  Filled 2024-02-20: qty 100

## 2024-02-20 MED ORDER — ESMOLOL HCL 100 MG/10ML IV SOLN
INTRAVENOUS | Status: DC | PRN
  Administered 2024-02-20 (×3): 10 mg via INTRAVENOUS

## 2024-02-20 MED ORDER — HEMOSTATIC AGENTS (NO CHARGE) OPTIME
TOPICAL | Status: DC | PRN
  Administered 2024-02-20 (×2): 1 via TOPICAL

## 2024-02-20 MED ORDER — GABAPENTIN 300 MG PO CAPS
300.0000 mg | ORAL_CAPSULE | Freq: Two times a day (BID) | ORAL | Status: DC
  Administered 2024-02-20 – 2024-02-24 (×8): 300 mg via ORAL
  Filled 2024-02-20 (×8): qty 1

## 2024-02-20 MED ORDER — ONDANSETRON HCL 4 MG/2ML IJ SOLN
INTRAMUSCULAR | Status: DC | PRN
  Administered 2024-02-20: 4 mg via INTRAVENOUS

## 2024-02-20 MED ORDER — BUDESON-GLYCOPYRROL-FORMOTEROL 160-9-4.8 MCG/ACT IN AERO
2.0000 | INHALATION_SPRAY | Freq: Two times a day (BID) | RESPIRATORY_TRACT | Status: DC
  Administered 2024-02-20 – 2024-02-24 (×8): 2 via RESPIRATORY_TRACT
  Filled 2024-02-20 (×2): qty 5.9

## 2024-02-20 MED ORDER — CEFAZOLIN SODIUM-DEXTROSE 2-4 GM/100ML-% IV SOLN
2.0000 g | Freq: Three times a day (TID) | INTRAVENOUS | Status: AC
  Administered 2024-02-20 – 2024-02-21 (×2): 2 g via INTRAVENOUS
  Filled 2024-02-20 (×2): qty 100

## 2024-02-20 MED ORDER — MORPHINE SULFATE (PF) 2 MG/ML IV SOLN
2.0000 mg | INTRAVENOUS | Status: DC | PRN
  Administered 2024-02-20 – 2024-02-23 (×8): 2 mg via INTRAVENOUS
  Filled 2024-02-20 (×8): qty 1

## 2024-02-20 MED ORDER — SORBITOL 70 % SOLN
30.0000 mL | Freq: Every day | Status: DC | PRN
Start: 2024-02-20 — End: 2024-02-24
  Filled 2024-02-20: qty 30

## 2024-02-20 MED ORDER — CEFAZOLIN SODIUM 1 G IJ SOLR
INTRAMUSCULAR | Status: AC
  Filled 2024-02-20: qty 20

## 2024-02-20 MED ORDER — OXYCODONE-ACETAMINOPHEN 5-325 MG PO TABS
1.0000 | ORAL_TABLET | ORAL | Status: DC | PRN
  Administered 2024-02-20: 2 via ORAL
  Administered 2024-02-20: 1 via ORAL
  Administered 2024-02-21 – 2024-02-22 (×7): 2 via ORAL
  Administered 2024-02-23: 1 via ORAL
  Administered 2024-02-23: 2 via ORAL
  Administered 2024-02-23 (×2): 1 via ORAL
  Administered 2024-02-24: 2 via ORAL
  Filled 2024-02-20: qty 2
  Filled 2024-02-20: qty 1
  Filled 2024-02-20 (×4): qty 2
  Filled 2024-02-20: qty 1
  Filled 2024-02-20 (×5): qty 2
  Filled 2024-02-20: qty 1
  Filled 2024-02-20: qty 2

## 2024-02-20 MED ORDER — NITROGLYCERIN IN D5W 200-5 MCG/ML-% IV SOLN: 5.0000 ug/min | INTRAVENOUS | Status: DC

## 2024-02-20 MED ORDER — PANTOPRAZOLE SODIUM 40 MG PO TBEC
40.0000 mg | DELAYED_RELEASE_TABLET | Freq: Every day | ORAL | Status: DC
  Administered 2024-02-21 – 2024-02-24 (×4): 40 mg via ORAL
  Filled 2024-02-20 (×4): qty 1

## 2024-02-20 MED ORDER — PROPOFOL 10 MG/ML IV BOLUS
INTRAVENOUS | Status: AC
  Filled 2024-02-20: qty 20

## 2024-02-20 MED ORDER — MAGNESIUM SULFATE 2 GM/50ML IV SOLN: 2.0000 g | Freq: Every day | INTRAVENOUS | Status: DC | PRN

## 2024-02-20 MED ORDER — LABETALOL HCL 5 MG/ML IV SOLN
INTRAVENOUS | Status: DC | PRN
  Administered 2024-02-20 (×2): 5 mg via INTRAVENOUS

## 2024-02-20 MED ORDER — PROPOFOL 10 MG/ML IV BOLUS
INTRAVENOUS | Status: DC | PRN
  Administered 2024-02-20: 70 mg via INTRAVENOUS

## 2024-02-20 MED ORDER — GABAPENTIN 300 MG PO CAPS
600.0000 mg | ORAL_CAPSULE | Freq: Every day | ORAL | Status: DC
  Administered 2024-02-20 – 2024-02-23 (×4): 600 mg via ORAL
  Filled 2024-02-20 (×4): qty 2

## 2024-02-20 SURGICAL SUPPLY — 75 items
BAG DECANTER FOR FLEXI CONT (MISCELLANEOUS) ×3 IMPLANT
BAG ISOLATATION DRAPE 20X20 ST (DRAPES) ×3 IMPLANT
BALLOON DORADO 6X40X80 (BALLOONS) IMPLANT
BALLOON LUTONIX DCB 5X100X130 (BALLOONS) IMPLANT
BLADE SURG 15 STRL LF DISP TIS (BLADE) ×3 IMPLANT
BLADE SURG SZ11 CARB STEEL (BLADE) ×3 IMPLANT
BRUSH SCRUB EZ 4% CHG (MISCELLANEOUS) ×3 IMPLANT
CATH BEACON 5 .035 65 KMP TIP (CATHETERS) IMPLANT
CHLORAPREP W/TINT 26 (MISCELLANEOUS) ×6 IMPLANT
CLAMP SUTURE YELLOW 5 PAIRS (MISCELLANEOUS) ×6 IMPLANT
CLEANSER WND VASHE 34 (WOUND CARE) ×3 IMPLANT
CLIP APPLIE 11 MED OPEN (CLIP) IMPLANT
CLIP APPLIE 13 LRG OPEN (CLIP) IMPLANT
CLIP APPLIE 9.375 SM OPEN (CLIP) IMPLANT
DEVICE PRESTO INFLATION (MISCELLANEOUS) IMPLANT
DRAPE C-ARM XRAY 36X54 (DRAPES) IMPLANT
DRAPE INCISE IOBAN 66X45 STRL (DRAPES) ×6 IMPLANT
DRAPE SHEET LG 3/4 BI-LAMINATE (DRAPES) ×3 IMPLANT
DRESSING SURGICEL FIBRLLR 1X2 (HEMOSTASIS) ×3 IMPLANT
DRSG OPSITE POSTOP 4X6 (GAUZE/BANDAGES/DRESSINGS) IMPLANT
DRSG OPSITE POSTOP 4X8 (GAUZE/BANDAGES/DRESSINGS) IMPLANT
ELECT CAUTERY BLADE 6.4 (BLADE) ×3 IMPLANT
ELECTRODE REM PT RTRN 9FT ADLT (ELECTROSURGICAL) ×3 IMPLANT
GLIDEWIRE ADV .035X260CM (WIRE) IMPLANT
GLOVE BIO SURGEON STRL SZ7 (GLOVE) ×9 IMPLANT
GLOVE SURG SYN 8.0 (GLOVE) ×9 IMPLANT
GLOVE SURG SYN 8.0 PF PI (GLOVE) ×3 IMPLANT
GOWN STRL REUS W/ TWL LRG LVL3 (GOWN DISPOSABLE) ×9 IMPLANT
GOWN STRL REUS W/ TWL XL LVL3 (GOWN DISPOSABLE) ×3 IMPLANT
GOWN STRL REUS W/TWL 2XL LVL3 (GOWN DISPOSABLE) ×3 IMPLANT
GRAFT VASC PATCH XENOSURE 1X14 (Vascular Products) IMPLANT
HEAD CUTTING 'VALVULOTOME URSL (MISCELLANEOUS) IMPLANT
HEMOSTAT HEMOBLAST BELLOWS (HEMOSTASIS) IMPLANT
IV NS 500ML BAXH (IV SOLUTION) ×3 IMPLANT
KIT STIMULAN RAPID CURE 5CC (Orthopedic Implant) IMPLANT
KIT TURNOVER KIT A (KITS) ×3 IMPLANT
LABEL OR SOLS (LABEL) ×3 IMPLANT
LOOP VESSEL MAXI 1X406 RED (MISCELLANEOUS) ×9 IMPLANT
LOOP VESSEL MINI 0.8X406 BLUE (MISCELLANEOUS) ×12 IMPLANT
MANIFOLD NEPTUNE II (INSTRUMENTS) ×3 IMPLANT
NDL FILTER BLUNT 18X1 1/2 (NEEDLE) ×3 IMPLANT
NDL SAFETY ECLIPSE 18X1.5 (NEEDLE) ×3 IMPLANT
NEEDLE FILTER BLUNT 18X1 1/2 (NEEDLE) ×3 IMPLANT
PACK ANGIOGRAPHY (CUSTOM PROCEDURE TRAY) IMPLANT
PACK BASIN MAJOR ARMC (MISCELLANEOUS) ×3 IMPLANT
PACK UNIVERSAL (MISCELLANEOUS) ×3 IMPLANT
PAD PREP OB/GYN DISP 24X41 (PERSONAL CARE ITEMS) ×3 IMPLANT
PENCIL SMOKE EVACUATOR (MISCELLANEOUS) ×3 IMPLANT
RETRACTOR TRAXI PANNICULUS (MISCELLANEOUS) IMPLANT
SET WALTER ACTIVATION W/DRAPE (SET/KITS/TRAYS/PACK) ×3 IMPLANT
SHEATH BRITE TIP 8FRX11 (SHEATH) IMPLANT
STAPLER SKIN PROX 35W (STAPLE) ×3 IMPLANT
STENT VIABAHN 6X7.5X120 (Permanent Stent) IMPLANT
SUT PROLENE 5 0 RB 1 DA (SUTURE) ×12 IMPLANT
SUT PROLENE 6 0 BV (SUTURE) ×18 IMPLANT
SUT PROLENE 7 0 BV 1 (SUTURE) ×6 IMPLANT
SUT SILK 2 0 SH (SUTURE) IMPLANT
SUT SILK 2-0 18XBRD TIE 12 (SUTURE) ×3 IMPLANT
SUT SILK 3-0 18XBRD TIE 12 (SUTURE) ×3 IMPLANT
SUT SILK 4-0 18XBRD TIE 12 (SUTURE) ×3 IMPLANT
SUT VIC AB 2-0 CT1 TAPERPNT 27 (SUTURE) ×6 IMPLANT
SUT VIC AB 3-0 SH 27X BRD (SUTURE) ×3 IMPLANT
SUT VICRYL+ 3-0 36IN CT-1 (SUTURE) ×6 IMPLANT
SUTURE EHLN 3-0 FS-10 30 BLK (SUTURE) ×6 IMPLANT
SUTURE GTX CV-5 36 TTC13 3/8CR (SUTURE) IMPLANT
SUTURE GTX CV-6 30 TTC13 3/8CR (SUTURE) IMPLANT
SUTURE MNCRL 4-0 27XMF (SUTURE) IMPLANT
SYR 20ML LL LF (SYRINGE) ×3 IMPLANT
SYR 3ML LL SCALE MARK (SYRINGE) ×3 IMPLANT
TOWEL OR 17X26 4PK STRL BLUE (TOWEL DISPOSABLE) IMPLANT
TRAP FLUID SMOKE EVACUATOR (MISCELLANEOUS) ×3 IMPLANT
TRAY FOLEY MTR SLVR 16FR STAT (SET/KITS/TRAYS/PACK) ×3 IMPLANT
VALVULOTOME HEAD CUTTING URSL (MISCELLANEOUS) IMPLANT
WATER STERILE IRR 1000ML POUR (IV SOLUTION) IMPLANT
WIRE G V18X300CM (WIRE) IMPLANT

## 2024-02-20 NOTE — Plan of Care (Signed)

## 2024-02-20 NOTE — Anesthesia Procedure Notes (Addendum)
 Arterial Line Insertion Start/End6/25/2025 7:55 AM, 02/20/2024 8:10 AM Performed by: Dario Barter, MD, anesthesiologist  Patient location: OR. Preanesthetic checklist: patient identified, IV checked, site marked, risks and benefits discussed, surgical consent, monitors and equipment checked, pre-op evaluation, timeout performed and anesthesia consent Lidocaine  1% used for infiltration Left, radial was placed Catheter size: 20 G Hand hygiene performed  and maximum sterile barriers used   Attempts: 1 Procedure performed using ultrasound guided technique. Ultrasound Notes:anatomy identified, needle tip was noted to be adjacent to the nerve/plexus identified and no ultrasound evidence of intravascular and/or intraneural injection Following insertion, dressing applied and Biopatch. Post procedure assessment: normal and unchanged  Patient tolerated the procedure well with no immediate complications.

## 2024-02-20 NOTE — Progress Notes (Signed)
 Patient had episode of intermittent forceful coughing for 1-2 minutes. Dr Marea notified. Patient maintained oxygen saturation at 95% throughout. The patient was given 2mg  of morphine , labetolol for blood pressure x2. See MAR. Pedal and Tibial pulses present with doppler. Incision sites assessed, no hematoma or new drainage noted. Patient states pain is minimal at this time. Patient's urine blood tinged after episode. MD made aware. Patient resting at this time.

## 2024-02-20 NOTE — Op Note (Signed)
 OPERATIVE NOTE   PROCEDURE: 1.   Left common femoral, profunda femoris, and superficial femoral artery endarterectomies 2.   Right common femoral, profunda femoris, and superficial femoral artery endarterectomies 3.   Right lower extremity angiogram 4.   Stent placement to the right popliteal artery with 6 mm diameter by 7.5 cm length Viabahn stent   PRE-OPERATIVE DIAGNOSIS: 1.Atherosclerotic occlusive disease bilateral lower extremities with ulceration   POST-OPERATIVE DIAGNOSIS: Same  SURGEON: Selinda Gu, MD  CO-surgeon: Cordella Shawl, MD  ANESTHESIA:  general  ESTIMATED BLOOD LOSS: 200 cc  FINDING(S): 1.  significant plaque in bilateral common femoral, profunda femoris, and superficial femoral arteries 2.   Nearly occlusive stenosis in the right mid popliteal artery of greater than 90% with no significant SFA or tibial disease identified although flow was quite sluggish and difficult to opacify.  SPECIMEN(S):  Bilateral common femoral, profunda femoris, and superficial femoral artery plaque.  INDICATIONS:    Patient presents with worsening pain in the lower extremities and now with superficial ulcerations on both lower legs.  Bilateral femoral endarterectomies as well as right infrainguinal revascularization are planned to try to improve perfusion.  The risks and benefits as well as alternative therapies including intervention were reviewed in detail all questions were answered the patient agrees to proceed with surgery.  DESCRIPTION: After obtaining full informed written consent, the patient was brought back to the operating room and placed supine upon the operating table.  The patient received IV antibiotics prior to induction.  After obtaining adequate anesthesia, the patient was prepped and draped in the standard fashion appropriate time out is called.    With myself working on the left and Dr. Shawl working on the right we began by dissecting out the femoral arteries on  each side. Vertical incisions were created overlying both femoral arteries. The common femoral artery proximally, and superficial femoral artery, and primary profunda femoris artery branches were encircled with vessel loops and prepared for control. Both femoral arteries were found to have significant plaque from the common femoral artery into the profunda and superficial femoral arteries.   7000 units of heparin  was given and allowed circulate for 5 minutes.   Attention is then turned to the left femoral artery.  An arteriotomy is made with 11 blade and extended with Potts scissors in the common femoral artery and carried down onto the first 2-3 cm of the left superficial femoral artery. An endarterectomy was then performed. The [facility] was used to create a plane. The proximal endpoint was cut flush with tenotomy scissors. This was in the proximal common femoral artery. An eversion endarterectomy was then performed for the first 2-3 cm of the profunda femoris artery. Good backbleeding was then seen.  The profunda femoris plaque was tacked down with a total of three 7-0 Prolene sutures.  The distal endpoint of the superficial femoral endarterectomy was created with gentle traction and the distal endpoint was tacked down with two 7-0 Prolene sutures.  The bovine pericardial patcth is then selected and prepared for a patch angioplasty.  It is cut and beveled and started at the proximal endpoint with a 6-0 Prolene suture.  Approximately one half of the suture line is run medially and laterally and the distal end point was cut and bevelled to match the arteriotomy.  A second 6-0 Prolene was started at the distal end point and run to the mid portion to complete the arteriotomy.  The vessel was flushed prior to release of control and completion  of the anastomosis.  At this point, flow was established first to the profunda femoris artery and then to the superficial femoral artery. Easily palpable pulses are  noted well beyond the anastomosis and both arteries.  The right femoral artery is then addressed. Arteriotomy is made in the common femoral artery and extended down into the first 3 to 4 cm of the right superficial femoral artery. Similarly, an endarterectomy was performed with the [facility]. The proximal endpoint was cut flush with tenotomy scissors in the proximal common femoral artery.  The proximal profunda femoris artery was addressed and treated with an eversion endarterectomy and this was performed with a hemostat and gentle traction.  Three 7-0 Prolene tacking sutures were used in the right profunda femoris artery.  The arteriotomy was carried down onto the superficial femoral artery and the endarterectomy was continued to this point. The distal endpoint was clean and feathered and the superficial femoral artery. The bovine pericardial patch was then brought onto the field.  It is cut and beveled and started at the proximal endpoint with a 6-0 Prolene suture.  Approximately one half of the suture line is run medially and laterally and the distal end point was cut and bevelled to match the arteriotomy.  A second 6-0 Prolene was started at the distal end point and run to the mid portion to complete the arteriotomy, leaving a small gap for the 8 French sheath to treat distally and placing interrupted 6-0 Prolene sutures on either side of the gap. Attention was then turned to the endovascular portion of the procedure.  Imaging was performed through the right femoral sheath demonstrating the SFA and profunda femoris endarterectomies to now be widely patent as was the common femoral artery.  The SFA had mild disease but nothing that was flow-limiting.  There was a near occlusive stenosis of greater than 90% in the mid popliteal artery.  The tibial vessels did not had any obvious stenosis that we could identify but the flow was extremely sluggish distally.  I then used a V18 wire to easily cross the  lesion in the popliteal artery.  I elected to primarily stent this lesion with a 6 mm diameter by 7.5 cm length Viabahn stent.  This was postdilated with 5 and a 6 mm diameter Lutonix drug-coated balloon with excellent angiographic completion result and less than 15% residual stenosis.  We then concluded the endovascular portion of the procedure and remove the 8 French sheath Attention was turned to closure of the arteriotomy.   Flushing maneuvers were performed and flow was reestablished to the femoral vessels after completing the suture line with the interrupted 6-0 Prolene sutures that had been placed in the gap left for the sheath. Excellent pulses noted in the right superficial femoral and profunda femoris artery below the femoral anastomosis.  We then proceeded with closure.  Vashe irrigation was used and gentamicin and vancomycin  impregnated antibiotic beads were then placed in the wound.  Fibrillar and Hemoblast topical hemostatic agents were placed in the femoral incisions and hemostasis was complete. The femoral incisions were then closed in a layered fashion with 2 layers of 2-0 Vicryl, 2 layers of 3-0 Vicryl, and staples for the skin closure. Sterile dressing were then placed over all incisions.  The patient was then awakened from anesthesia and taken to the recovery room in stable condition having tolerated the procedure well.  COMPLICATIONS: None  CONDITION: Stable     Selinda Gu 02/20/2024 12:23 PM  This note was  created with Dragon Medical transcription system. Any errors in dictation are purely unintentional.

## 2024-02-20 NOTE — Plan of Care (Signed)
   Problem: Education: Goal: Knowledge of General Education information will improve Description: Including pain rating scale, medication(s)/side effects and non-pharmacologic comfort measures Outcome: Progressing   Problem: Pain Managment: Goal: General experience of comfort will improve and/or be controlled Outcome: Progressing   Problem: Safety: Goal: Ability to remain free from injury will improve Outcome: Progressing

## 2024-02-20 NOTE — Anesthesia Procedure Notes (Signed)
 Procedure Name: Intubation Date/Time: 02/20/2024 7:51 AM  Performed by: Jackye Spanner, CRNAPre-anesthesia Checklist: Patient identified, Patient being monitored, Timeout performed, Emergency Drugs available and Suction available Patient Re-evaluated:Patient Re-evaluated prior to induction Oxygen Delivery Method: Circle system utilized Preoxygenation: Pre-oxygenation with 100% oxygen Induction Type: IV induction Ventilation: Mask ventilation without difficulty Laryngoscope Size: 3 and McGrath Grade View: Grade I Tube type: Oral Tube size: 7.0 mm Number of attempts: 1 Airway Equipment and Method: Stylet Placement Confirmation: ETT inserted through vocal cords under direct vision, positive ETCO2 and breath sounds checked- equal and bilateral Secured at: 22 cm Tube secured with: Tape Dental Injury: Teeth and Oropharynx as per pre-operative assessment  Comments: Smooth atraumatic intubation, no complications noted.

## 2024-02-20 NOTE — H&P (Addendum)
 [facility] Admission History & Physical  MRN : [id number]  Mark Nash is a 68 y.o. ([birth year]) male who presents with chief complaint of No chief complaint on file. [name]  History of Present Illness: patient presents for his surgical revascularization of the lower extremities. Right leg symptoms have progressed from his last visit over three months ago.  Now with some rest pain symptoms on the right. Claudication on the left side.  Now has small ulcerations just above the medial ankles bilaterally.   Current Facility-Administered Medications  Medication Dose Route Frequency Provider Last Rate Last Admin   0.9 %  sodium chloride  infusion   Intravenous Continuous Mazzoni, Andrea, MD 10 mL/hr at 02/20/24 0702 New Bag at 02/20/24 0702   ceFAZolin  (ANCEF ) IVPB 2g/100 mL premix  2 g Intravenous Once Elnor Dorise BRAVO, NP        Past Medical History:  Diagnosis Date   Aortic atherosclerosis (HCC)    Aortic root dilatation (HCC)    Arthritis    Asthma    Atrial fibrillation and flutter (HCC)    a.) CHA2DS2VASc = 5 (age, HFrEF, HTN, vascular disease, T2DM) as of 02/19/2024; b.) s/p DCCV 05/20/2021 --> 150 J x 1 and 200 J x 3 (converted to NSR after 1st 200 J x 1 min with ERAF); c.) rate/rhythm maintained on oral digoxin  + metoprolol  succinate; chronically anticoagulated with apixaban    Calcaneal spur    Colon polyps    Constipation    COPD (chronic obstructive pulmonary disease) (HCC)    Coronary artery disease    DDD (degenerative disc disease), lumbar    a.) s/p L3-L5 laminectomy/decompression in 2017   Diverticulosis    Dyspnea    GERD (gastroesophageal reflux disease)    Hepatitis C    a.) has not been Tx'd; b.) endorses IVDU ages [age]   HFrEF (heart failure with reduced ejection fraction) (HCC)    History of intravenous drug abuse    a.) age [age]   History of kidney stones    History of right cataract extraction    Hyperlipidemia    Hypertension  06/10/2018   Left inguinal hernia    NICM (nonischemic cardiomyopathy) (HCC)    On apixaban  therapy    Peripheral vascular disease (HCC)    Pulmonary hypertension (HCC)    Restless legs    Schizoaffective disorder (HCC)    a.) refuses to consult with psychiatry per PCP   Status post bilateral inguinal hernia repair    T2DM (type 2 diabetes mellitus) (HCC)    Thrombocytopenia (HCC)    Vitamin D  deficiency    Wears full dentures     Past Surgical History:  Procedure Laterality Date   CARDIOVERSION N/A 05/20/2021   Procedure: CARDIOVERSION;  Surgeon: Darron Deatrice LABOR, MD;  Location: [facility];  Service: Cardiovascular;  Laterality: N/A;   CATARACT EXTRACTION W/PHACO Right 10/31/2021   Procedure: CATARACT EXTRACTION PHACO AND INTRAOCULAR LENS PLACEMENT (IOC) RIGHT DIABETIC;  Surgeon: Myrna Adine Anes, MD;  Location: [facility];  Service: Ophthalmology;  Laterality: Right;  Diabetic 4.34 00:42.8   COLONOSCOPY WITH PROPOFOL  N/A 09/07/2017   Procedure: COLONOSCOPY WITH PROPOFOL ;  Surgeon: Janalyn Keene NOVAK, MD;  Location: [facility];  Service: Endoscopy;  Laterality: N/A;   ESOPHAGOGASTRODUODENOSCOPY (EGD) WITH PROPOFOL  N/A 12/07/2017   Procedure: ESOPHAGOGASTRODUODENOSCOPY (EGD) WITH PROPOFOL ;  Surgeon: Janalyn Keene NOVAK, MD;  Location: [facility];  Service: Endoscopy;  Laterality: N/A;   FLEXIBLE SIGMOIDOSCOPY N/A 11/09/2017   Procedure:  FLEXIBLE SIGMOIDOSCOPY;  Surgeon: Janalyn Keene NOVAK, MD;  Location: [facility];  Service: Endoscopy;  Laterality: N/A;   INGUINAL HERNIA REPAIR Bilateral 06/21/2018   Procedure: LAPAROSCOPIC BILATERAL INGUINAL HERNIA REPAIR;  Surgeon: Nicholaus Selinda Birmingham, MD;  Location: [facility];  Service: General;  Laterality: Bilateral;   L3 TO L5 LAMINECTOMY FOR DECOMPRESSION  07/17/2016   [facility]   LOWER EXTREMITY ANGIOGRAPHY Right 11/05/2023   Procedure: Lower Extremity Angiography;  Surgeon: Marea Selinda RAMAN, MD;   Location: [facility];  Service: Cardiovascular;  Laterality: Right;   REPAIR OF CEREBROSPINAL FLUID LEAK N/A 09/08/2016   Procedure: Lumbar wound exploration, repair of pseudomenigocele;  Surgeon: Morene Hicks Ditty, MD;  Location: [facility];  Service: Neurosurgery;  Laterality: N/A;  Lumbar wound exploration, repair of pseudomenigocele   RIGHT/LEFT HEART CATH AND CORONARY ANGIOGRAPHY N/A 01/29/2023   Procedure: RIGHT/LEFT HEART CATH AND CORONARY ANGIOGRAPHY;  Surgeon: Darron Deatrice LABOR, MD;  Location: [facility];  Service: Cardiovascular;  Laterality: N/A;   SINUSOTOMY     as a child     Social History   Tobacco Use   Smoking status: Every Day    Current packs/day: 1.50    Average packs/day: 1.5 packs/day for 46.0 years (69.0 ttl pk-yrs)    Types: Cigarettes   Smokeless tobacco: Never  Vaping Use   Vaping status: Never Used  Substance Use Topics   Alcohol use: No    Alcohol/week: 0.0 standard drinks of alcohol   Drug use: No     Family History  Problem Relation Age of Onset   Heart failure Mother    Hypertension Mother    Diverticulitis Mother    Colitis Mother    Diabetes Father    Hypertension Father    Heart disease Father    Diabetes Sister    Heart failure Brother    Diverticulitis Brother     Allergies  Allergen Reactions   Penicillins Hives and Swelling    Childhood allergy.  Has tolerated 3rd generation cephalosporin (CEFTRIAXONE ) without documented ADRs   Aspirin  Nausea Only   Ibuprofen Nausea Only   Lyrica  [Pregabalin ] Nausea Only   Acetaminophen  Nausea Only     REVIEW OF SYSTEMS (Negative unless checked)   Constitutional: [] Weight loss  [] Fever  [] Chills Cardiac: [] Chest pain   [] Chest pressure   [] Palpitations   [] Shortness of breath when laying flat   [] Shortness of breath at rest   [x] Shortness of breath with exertion. Vascular:  [x] Pain in legs with walking   [] Pain in legs at rest   [] Pain in legs when laying flat    [x] Claudication   [] Pain in feet when walking  [x] Pain in feet at rest  [] Pain in feet when laying flat   [] History of DVT   [] Phlebitis   [] Swelling in legs   [] Varicose veins   [] Non-healing ulcers Pulmonary:   [] Uses home oxygen   [] Productive cough   [] Hemoptysis   [] Wheeze  [x] COPD   [] Asthma Neurologic:  [] Dizziness  [] Blackouts   [] Seizures   [] History of stroke   [] History of TIA  [] Aphasia   [] Temporary blindness   [] Dysphagia   [] Weakness or numbness in arms   [] Weakness or numbness in legs Musculoskeletal:  [x] Arthritis   [] Joint swelling   [] Joint pain   [] Low back pain Hematologic:  [] Easy bruising  [] Easy bleeding   [] Hypercoagulable state   [] Anemic  [] Hepatitis Gastrointestinal:  [] Blood in stool   [] Vomiting blood  [x] Gastroesophageal reflux/heartburn   []   Abdominal pain Genitourinary:  [] Chronic kidney disease   [] Difficult urination  [] Frequent urination  [] Burning with urination   [] Hematuria Skin:  [] Rashes   [] Ulcers   [] Wounds Psychological:  [] History of anxiety   []  History of major depression.    Physical Examination  Vitals:   02/20/24 0630  BP: (!) 165/99  Pulse: 98  Resp: 18  Temp: 97.7 F (36.5 C)  TempSrc: Temporal  SpO2: 95%  Weight: 67.9 kg  Height: 5' 11 (1.803 m)   Body mass index is 20.88 kg/m. Gen: WD/WN, NAD Head: [place]/AT, No temporalis wasting. Ear/Nose/Throat: Hearing grossly intact, nares w/o erythema or drainage, oropharynx w/o Erythema/Exudate,  Eyes: Conjunctiva clear, sclera non-icteric Neck: Trachea midline.  No JVD.  Pulmonary:  Good air movement, respirations not labored, no use of accessory muscles.  Cardiac: RRR, normal S1, S2. Vascular:  Vessel Right Left  Radial Palpable Palpable                          PT Not Palpable 1+ Palpable  DP Not Palpable 1+ Palpable   Gastrointestinal: soft, non-tender/non-distended. No guarding/reflex.  Musculoskeletal: M/S 5/5 throughout.  Extremities without ischemic changes.  No deformity  or atrophy.  Neurologic: Sensation grossly intact in extremities.  Symmetrical.  Speech is fluent. Motor exam as listed above. Psychiatric: Judgment intact, Mood & affect appropriate for pt's clinical situation. Dermatologic: small superficial ulcerations are present just above the medial ankle bilaterally.      CBC Lab Results  Component Value Date   WBC 6.6 12/24/2023   HGB 16.7 12/24/2023   HCT 50.8 12/24/2023   MCV 91 12/24/2023   PLT 171 12/24/2023    BMET    Component Value Date/Time   NA 139 12/24/2023 1559   K 4.9 12/24/2023 1559   CL 101 12/24/2023 1559   CO2 27 12/24/2023 1559   GLUCOSE 94 12/24/2023 1559   GLUCOSE 237 (H) 11/19/2023 1453   BUN 14 12/24/2023 1559   CREATININE 1.00 12/24/2023 1559   CREATININE 0.95 11/19/2023 1453   CALCIUM 9.7 12/24/2023 1559   GFRNONAA >60 11/05/2023 0720   GFRNONAA 100 09/15/2020 1500   GFRAA 116 09/15/2020 1500   CrCl cannot be calculated (Patient's most recent lab result is older than the maximum 21 days allowed.).  COAG Lab Results  Component Value Date   INR 1.0 04/20/2021    Radiology No results found.   Assessment/Plan Atherosclerosis with bilateral ulcerations bilaterally  For bilateral femoral endarterectomies today and right infrainguinal intervention versus bypass.  Risks and benefits are discussed and he is agreeable to proceed  Essential (primary) hypertension blood pressure control important in reducing the progression of atherosclerotic disease. On appropriate oral medications.     Type 2 diabetes mellitus with diabetic polyneuropathy, with long-term current use of insulin  (HCC) blood glucose control important in reducing the progression of atherosclerotic disease. Also, involved in wound healing. On appropriate medications. Last Hgb A1c was over 10.   HLD (hyperlipidemia) lipid control important in reducing the progression of atherosclerotic disease. Continue statin therapy  Selinda Gu,  MD  02/20/2024 7:28 AM

## 2024-02-20 NOTE — Op Note (Signed)
 OPERATIVE NOTE   PROCEDURE: Bilateral common femoral, superficial femoral and profunda femoris endarterectomy with bovine pericardial patch angioplasty. Right lower extremity angiogram Open angioplasty and stent placement right popliteal artery with a 6 mm x 75mm Viabahn stent  PRE-OPERATIVE DIAGNOSIS: Atherosclerotic occlusive disease bilateral lower extremities with severe rest pain symptoms and ulceration  POST-OPERATIVE DIAGNOSIS: Same  CO-SURGEON: Cordella JUDITHANN Shawl, MD and Selinda CANDIE Gu, M.D.  ASSISTANT(S): None  ANESTHESIA: general  ESTIMATED BLOOD LOSS: 200 cc  FINDING(S): Profound calcific plaque noted bilaterally extending past the initial bifurcation of the profunda femoris arteries as well as down the extensive length of the SFA  SPECIMEN(S):  Calcific plaque from the common femoral, superficial femoral and the profunda femoris arteries bilaterally  INDICATIONS:   Mark Nash 68 y.o. y.o.male who presents with complaints of lifestyle limiting claudication and pain continuously in the feet bilaterally. The patient has documented severe atherosclerotic occlusive disease and has undergone multiple minimally invasive treatments in the past. However, at this point his primary area of stricture stenosis resides in the common femoral and origins of the superficial femoral and profunda femoris extending into these arteries and therefore this is not amenable to intervention and he is now undergoing open endarterectomy. The risks and benefits of been reviewed with the patient, all questions have answered; alternative therapies have been reviewed as well and the patient has agreed to proceed with surgical open repair.  DESCRIPTION: After obtaining full informed written consent, the patient was brought back to the operating room and placed supine upon the operating table.  The patient received IV antibiotics prior to induction.  After obtaining adequate anesthesia, the patient  was prepped and draped in the standard fashion for: bilateral femoral exposure.  Co-surgeons are required because this is a very complex bilateral procedure with work being performed simultaneously from both the right femoral and left femoral approach.  This also expedites the procedure making a shorter operative time reducing complications and improving patient safety.  Attention was turned to the bilateral groin with Dr. Gu working on the patient's left  and myself working on the right of the patient.  Vertical  incisions were made over the common femoral artery and dissected down to the common femoral artery with electrocautery.  I dissected out the common femoral artery from the distal external iliac artery (identified by the superficial circumflex vessels) down to the femoral bifurcation.  On initial inspection, the common femoral artery was: Densely calcified and there was no palpable pulse noted bilaterally.    Subsequently the dissection was continued to include all circumflex branches and the profunda femoral artery and superficial femoral artery. The superficial femoral artery was dissected circumferentially for a distance of approximately approximately 2 cm and the profunda femoris was dissected circumferentially out to the fourth order branches individual vessel loops were placed around each branch. Both of the groins were treated simultaneously as described above. Control of all branches was obtained with vessel loops.  A softer area in the distal external iliac artery amendable to clamping was identified.    The patient was given suture units of Heparin  intravenously, which was a therapeutic bolus.   After waiting 3 minutes, the left distal external iliac artery was clamped and we placed all the Silastic Vesseloops on the circumflex branches, the profunda and superficial femoral arteries under tension.  Arteriotomy was made in the left common femoral artery with a 11-blade and extended it with a  Potts scissor proximally and distally extending  the distal end down the SFA for approximately 3 cm.   Endarterectomy was then performed under direct visualization using a freer elevator and a right angle from the mid common femoral extending up both proximally and distally. Proximally the endarterectomy was brought up to the level of the clamp where a clean edge was obtained. Distally the endarterectomy was carried down to a soft spot in the SFA where a feathered edge would was obtained.    The left profunda femoris was treated with an eversion technique extending the endarterectomy approximately 2 cm distally.  7-0 Prolene interrupted tacking sutures were placed to secure the leading edge of the plaque in the profunda.  At this point, we fashioned a bovine pericardial patch for the geometry of the arteriotomy.  The patch was sewn to the artery with 2 running stitches of 6-0 Prolene, running from each end.  Prior to completing the patch angioplasty, the profunda femoral artery was flushed as was the superficial femoral artery. The system was then forward flushed. The endarterectomy site was then irrigated copiously with heparinized saline.   At this point attention was turned to the opposite groin.  The right distal external iliac artery was clamped and we placed all the Silastic Vesseloops on the circumflex branches, the profunda and superficial femoral arteries under tension.  Arteriotomy was made in the right common femoral artery with a 11-blade and extended it with a Potts scissor proximally and distally extending the distal end down the SFA for approximately 3 cm.   Endarterectomy was then performed under direct visualization using a freer elevator and a right angle from the mid common femoral extending up both proximally and distally. Proximally the endarterectomy was brought up to the level of the clamp where a clean edge was obtained. Distally the endarterectomy was carried down to a soft spot in  the SFA where a feathered edge would was obtained.    The right was then regular after the leg angio profunda femoris was treated with an eversion technique extending the endarterectomy approximately 2 cm distally7-0 Prolene interrupted tacking sutures were placed to secure the leading edge of the plaque in the SFA.   At this point, we fashioned a bovine pericardial patch for the geometry of the arteriotomy.  The patch was sewn to the artery with 2 running stitches of 6-0 Prolene, running from each end.  A small gap was left in the midportion and 2 interrupted 6-0 chromic sutures were used to secure the suture line.  We then began the interventional portion of the case.  An 8 [place] was then placed under direct visualization and a antegrade direction.  Hand-injection of contrast was then used to create imaging of the right lower extremity arterial system.  Using combination of advantage wire and Kumpe catheter the popliteal artery occlusion was crossed.  Injection of contrast confirmed intraluminal position and a V18 wire was inserted.  A 6 mm x 75 mm Viabahn stent was then deployed and postdilated first with a 6 mm balloon inflated to 10 atm for approximately 1 minute and then a 6 mm high-pressure [place] balloon.  Follow-up imaging demonstrated less than 10% residual stenosis through the popliteal SFA as patent and free of hemodynamically significant change.  There is three-vessel runoff to the foot although flow is very sluggish.  Intra-arterial nitroglycerin  was administered with slight improvement in flow.  The sheath was subsequently removed and the suture line completed.  The right patch angioplasty was completed in the usual fashion.  Flow was then reestablished first to the profunda femoris and then the superficial femoral artery. Any gaps or bleeding sites in the suture line were easily controlled with a 6-0 Prolene suture.   Both right and left groins were then irrigated copiously with s Vashe.   Subsequently Hemoblast and fibrillar were placed in the wound.  Antibiotic beads were reconstituted with vancomycin  and gentamicin  were placed in the bed of both wounds.  The incision was repaired with a double layer of 2-0 Vicryl, a double layer of 3-0 Vicryl, and the skin was closed with staples.  Prevena disposable vacs were then applied to both groin incisions.  COMPLICATIONS: None  CONDITION: Metta Cordella JUDITHANN Jama, M.D. [facility] Office: [phone]  02/20/2024, 12:21 PM

## 2024-02-20 NOTE — Progress Notes (Signed)
 Patient heart rate 124-128 in a-fib (hx of a-fib and baseline is a-fib). Notified Dr. Dario of elevated heart rate, no new orders given at this time.

## 2024-02-20 NOTE — Anesthesia Preprocedure Evaluation (Signed)
 Anesthesia Evaluation  Patient identified by MRN, date of birth, ID band Patient awake    Reviewed: Allergy & Precautions, NPO status , Patient's Chart, lab work & pertinent test results  History of Anesthesia Complications Negative for: history of anesthetic complications  Airway Mallampati: II  TM Distance: >3 FB Neck ROM: Full    Dental  (+) Edentulous Upper, Edentulous Lower, Dental Advidsory Given,    Pulmonary shortness of breath and with exertion, asthma , neg sleep apnea, COPD,  COPD inhaler, neg recent URI, Current Smoker   breath sounds clear to auscultation- rhonchi (-) wheezing      Cardiovascular hypertension, Pt. on medications (-) angina + Peripheral Vascular Disease and +CHF  (-) CAD, (-) Past MI, (-) Cardiac Stents and (-) CABG + dysrhythmias Atrial Fibrillation (-) Valvular Problems/Murmurs Rhythm:Regular Rate:Normal - Systolic murmurs and - Diastolic murmurs    Neuro/Psych neg Seizures PSYCHIATRIC DISORDERS    Schizophrenia  negative neurological ROS     GI/Hepatic PUD,GERD  ,,(+) Hepatitis -, C  Endo/Other  diabetes, Insulin  Dependent    Renal/GU negative Renal ROS     Musculoskeletal  (+) Arthritis ,    Abdominal  (+) - obese  Peds  Hematology negative hematology ROS (+)   Anesthesia Other Findings Past Medical History: No date: Arthritis No date: Asthma No date: Benign neoplasm of ascending colon     Comment:  polyps No date: Constipation No date: COPD (chronic obstructive pulmonary disease) (HCC)     Comment:  NO INHALER No date: Diabetes mellitus without complication (HCC)     Comment:  Type 2 No date: Diverticulosis No date: Dyspnea     Comment:  RARE-WITH EXERTION DUE TO COPD No date: GERD (gastroesophageal reflux disease) No date: Hepatitis C No date: History of kidney stones     Comment:  H/O No date: Hyperlipidemia 06/10/2018: Hypertension     Comment:  currently not under  control No date: Peripheral vascular disease (HCC) No date: Restless legs No date: Schizoaffective disorder (HCC)     Comment:  Pt denies No date: Thrombocytopenia (HCC) No date: Vitamin D  deficiency   Reproductive/Obstetrics                             Anesthesia Physical Anesthesia Plan  ASA: 3  Anesthesia Plan: General   Post-op Pain Management:    Induction: Intravenous  PONV Risk Score and Plan: 0 and Ondansetron , Dexamethasone  and Treatment may vary due to age or medical condition  Airway Management Planned: Oral ETT  Additional Equipment:   Intra-op Plan:   Post-operative Plan: Extubation in OR  Informed Consent: I have reviewed the patients History and Physical, chart, labs and discussed the procedure including the risks, benefits and alternatives for the proposed anesthesia with the patient or authorized representative who has indicated his/her understanding and acceptance.     Dental advisory given  Plan Discussed with: CRNA and Anesthesiologist  Anesthesia Plan Comments:         Anesthesia Quick Evaluation

## 2024-02-20 NOTE — Transfer of Care (Signed)
 Immediate Anesthesia Transfer of Care Note  Patient: Mark Nash  Procedure(s) Performed: ENDARTERECTOMY, FEMORAL (Bilateral) APPLICATION OF CELL SAVER ANGIOGRAM, LOWER EXTREMITY (Right) INSERTION, STENT, INTRAVASCULAR, PERCUTANEOUS, WITH IMAGING GUIDANCE (Right)  Patient Location: PACU  Anesthesia Type:General  Level of Consciousness: awake, alert , and drowsy  Airway & Oxygen Therapy: Patient Spontanous Breathing and Patient connected to face mask oxygen  Post-op Assessment: Report given to RN and Post -op Vital signs reviewed and stable  Post vital signs: Reviewed and stable  Last Vitals:  Vitals Value Taken Time  BP 140/98 02/20/24 12:30  Temp 36.1 1230  Pulse 119 02/20/24 12:31  Resp 18 1230  SpO2 100 % 02/20/24 12:33  Vitals shown include unfiled device data.  Last Pain:  Vitals:   02/20/24 0630  TempSrc: Temporal  PainSc: 0-No pain         Complications: No notable events documented.

## 2024-02-21 ENCOUNTER — Encounter: Payer: Self-pay | Admitting: Vascular Surgery

## 2024-02-21 DIAGNOSIS — E785 Hyperlipidemia, unspecified: Secondary | ICD-10-CM

## 2024-02-21 DIAGNOSIS — Z7901 Long term (current) use of anticoagulants: Secondary | ICD-10-CM

## 2024-02-21 DIAGNOSIS — I4892 Unspecified atrial flutter: Secondary | ICD-10-CM

## 2024-02-21 DIAGNOSIS — I25118 Atherosclerotic heart disease of native coronary artery with other forms of angina pectoris: Secondary | ICD-10-CM

## 2024-02-21 DIAGNOSIS — I10 Essential (primary) hypertension: Secondary | ICD-10-CM

## 2024-02-21 DIAGNOSIS — I420 Dilated cardiomyopathy: Secondary | ICD-10-CM

## 2024-02-21 DIAGNOSIS — I483 Typical atrial flutter: Secondary | ICD-10-CM

## 2024-02-21 DIAGNOSIS — I4891 Unspecified atrial fibrillation: Secondary | ICD-10-CM

## 2024-02-21 DIAGNOSIS — Z95828 Presence of other vascular implants and grafts: Secondary | ICD-10-CM

## 2024-02-21 DIAGNOSIS — Z9889 Other specified postprocedural states: Secondary | ICD-10-CM

## 2024-02-21 LAB — SURGICAL PATHOLOGY

## 2024-02-21 LAB — CBC
HCT: 45.1 % (ref 39.0–52.0)
Hemoglobin: 14.9 g/dL (ref 13.0–17.0)
MCH: 30.2 pg (ref 26.0–34.0)
MCHC: 33 g/dL (ref 30.0–36.0)
MCV: 91.5 fL (ref 80.0–100.0)
Platelets: 136 10*3/uL — ABNORMAL LOW (ref 150–400)
RBC: 4.93 MIL/uL (ref 4.22–5.81)
RDW: 13.8 % (ref 11.5–15.5)
WBC: 8.3 10*3/uL (ref 4.0–10.5)
nRBC: 0 % (ref 0.0–0.2)

## 2024-02-21 LAB — BASIC METABOLIC PANEL WITH GFR
Anion gap: 8 (ref 5–15)
BUN: 19 mg/dL (ref 8–23)
CO2: 24 mmol/L (ref 22–32)
Calcium: 8.5 mg/dL — ABNORMAL LOW (ref 8.9–10.3)
Chloride: 104 mmol/L (ref 98–111)
Creatinine, Ser: 0.75 mg/dL (ref 0.61–1.24)
GFR, Estimated: >60 mL/min (ref >60)
Glucose, Bld: 184 mg/dL — ABNORMAL HIGH (ref 70–99)
Potassium: 4 mmol/L (ref 3.5–5.1)
Sodium: 136 mmol/L (ref 135–145)

## 2024-02-21 LAB — GLUCOSE, CAPILLARY
Glucose-Capillary: 150 mg/dL — ABNORMAL HIGH (ref 70–99)
Glucose-Capillary: 100 mg/dL — ABNORMAL HIGH (ref 70–99)
Glucose-Capillary: 142 mg/dL — ABNORMAL HIGH (ref 70–99)
Glucose-Capillary: 130 mg/dL — ABNORMAL HIGH (ref 70–99)

## 2024-02-21 LAB — DIGOXIN LEVEL: Digoxin Level: 0.9 ng/mL (ref 0.8–2.0)

## 2024-02-21 MED ORDER — FAMOTIDINE 20 MG PO TABS
20.0000 mg | ORAL_TABLET | Freq: Two times a day (BID) | ORAL | Status: DC
  Administered 2024-02-21 – 2024-02-24 (×7): 20 mg via ORAL
  Filled 2024-02-21 (×7): qty 1

## 2024-02-21 MED ORDER — INSULIN ASPART 100 UNIT/ML IJ SOLN
0.0000 [IU] | Freq: Three times a day (TID) | INTRAMUSCULAR | Status: DC
  Administered 2024-02-21 – 2024-02-22 (×2): 2 [IU] via SUBCUTANEOUS
  Administered 2024-02-22 – 2024-02-23 (×2): 3 [IU] via SUBCUTANEOUS
  Administered 2024-02-23: 2 [IU] via SUBCUTANEOUS
  Filled 2024-02-21 (×5): qty 1

## 2024-02-21 MED ORDER — INSULIN ASPART 100 UNIT/ML IJ SOLN: 0.0000 [IU] | Freq: Every day | INTRAMUSCULAR | Status: DC

## 2024-02-21 NOTE — Evaluation (Signed)
 Physical Therapy Evaluation Patient Details Name: Mark Nash MRN: [id number] DOB: 02-13-1956 Today's Date: 02/21/2024  History of Present Illness  Pt is a 68 y.o male presenting for surgical revascularization of the BLE on 02/20/2024. RLE symptoms have progressed in the last three months with pain at rest. Claudication is present on LLE and now presents with B medial ankle ulcerations. Bilateral femoral endarterectomies performed 02/20/24. PMH: Aortic atherosclerosis, asthma, arthritis, A-fib, COPD, DDD (lumbar), dypsnea, GERD, HFrEF, PVD, pulmonary HTN, T2DM, and schizoaffective disorder.  Clinical Impression  Prior to recent medical concerns, pt reports being mod Independent with ADLs; lives alone in an apartment with 5 STE.  Currently pt is SBA for bed mobility, is able to perform a STS from bed to RW with min A (L hand kept NWB, 2nd PT assisted for L elbow to bear weight for transfer initiation), and ambulated 44 ft with RW and CGA +2 (to assist with maintaining L hand NWB and to help maneuver RW.). Pt required constant vc's for sequencing, tactile cues to assist with RW management, and increased cues to limit activity due to pt's increase grimmacing and reports of pain with ambulation despite pt's motivation to continue activity. Highest noted HR with ambulation was 111 bpm. Following ambulation PT inspected surgical dressing sites and noted: L groin incision: small sanguinous excretions at center of bandage, borders remain dry,clean and intact. R groin incision: no change from start of session. RN notified and made aware. Pt would currently benefit from skilled PT to address noted impairments and functional limitations (see below for any additional details).  Upon hospital discharge, pt would benefit from ongoing therapy.         If plan is discharge home, recommend the following: A little help with walking and/or transfers;A little help with bathing/dressing/bathroom;Assistance with  cooking/housework;Assist for transportation;Help with stairs or ramp for entrance   Can travel by private vehicle        Equipment Recommendations Rolling walker (2 wheels);BSC/3in1  Recommendations for Other Services       Functional Status Assessment Patient has had a recent decline in their functional status and demonstrates the ability to make significant improvements in function in a reasonable and predictable amount of time.     Precautions / Restrictions Precautions Precautions: Fall Recall of Precautions/Restrictions: Intact Restrictions Weight Bearing Restrictions Per Provider Order: No      Mobility  Bed Mobility Overal bed mobility: Needs Assistance Bed Mobility: Supine to Sit     Supine to sit: HOB elevated, Supervision     General bed mobility comments: Pt quick to get going, requires reminders to slow down and adhere to precautions (removal of A line site)    Transfers Overall transfer level: Needs assistance Equipment used: Rolling walker (2 wheels) Transfers: Sit to/from Stand Sit to Stand: Min assist, +2 safety/equipment (2nd PT to assist with maintaining A line removal precautions and to allow for elbow to bear weight to assist with STS)           General transfer comment: Vc's for hand placement and sequencing    Ambulation/Gait Ambulation/Gait assistance: Contact guard assist, +2 safety/equipment (2nd PT to assist with maintaining A line removal precautions and to help maneuver RW.) Gait Distance (Feet): 44 Feet Assistive device: Rolling walker (2 wheels) Gait Pattern/deviations: Step-to pattern, Decreased step length - right, Decreased step length - left, Decreased stance time - right, Decreased stance time - left, Knee flexed in stance - left, Knee flexed in stance - right, Narrow  base of support Gait velocity: decreased     General Gait Details: PT provided vc's for sequencing, tactile cues to assist with RW management, and increased cues  to limit activity due to pt's increase grimmacing with ambulation despite motivation to continue activity.  Stairs            Wheelchair Mobility     Tilt Bed    Modified Rankin (Stroke Patients Only)       Balance Overall balance assessment: Needs assistance Sitting-balance support: Feet supported, No upper extremity supported Sitting balance-Leahy Scale: Good Sitting balance - Comments: Steady static seated balance reaching within BOS   Standing balance support: Bilateral upper extremity supported, Reliant on assistive device for balance, During functional activity Standing balance-Leahy Scale: Fair Standing balance comment: Steady static and dynamic standing balance with increased assistance due to inability to bear significant weight through LUE secondary to precautions                             Pertinent Vitals/Pain Pain Assessment Pain Assessment: 0-10 Pain Score: 8  Pain Location: BLE, with emphasis on L knee Pain Descriptors / Indicators: Grimacing, Burning, Guarding Pain Intervention(s): Limited activity within patient's tolerance, Monitored during session, Pt premedicated with pain meds prior to session, Other (comment) (Notified RN)    Home Living Family/patient expects to be discharged to:: Private residence Living Arrangements: Alone Available Help at Discharge: Family;Available PRN/intermittently (Niece lives in [place], [place] be able to provide assistance) Type of Home: Apartment Home Access: Stairs to enter Entrance Stairs-Rails: Left Entrance Stairs-Number of Steps: 5   Home Layout: One level Home Equipment: Grab bars - tub/shower;Shower seat;Cane - single point      Prior Function Prior Level of Function : Independent/Modified Independent;Driving             Mobility Comments: Stated having SPCs but did not regularly use them for most of his mobility.       Extremity/Trunk Assessment   Upper Extremity Assessment Upper  Extremity Assessment: Defer to OT evaluation    Lower Extremity Assessment Lower Extremity Assessment: LLE deficits/detail;RLE deficits/detail;Generalized weakness RLE Deficits / Details: Grossly 3/5 LLE Deficits / Details: Grossly 3/5       Communication   Communication Communication: No apparent difficulties    Cognition Arousal: Alert Behavior During Therapy: WFL for tasks assessed/performed   PT - Cognitive impairments: Safety/Judgement                       PT - Cognition Comments: Pt is eager and motivated to get better, slightly impulsive with less regard to safety and or pain tolerance. Following commands: Intact       Cueing Cueing Techniques: Verbal cues, Tactile cues     General Comments General comments (skin integrity, edema, etc.): Pre session: HR: 75 bpm, SpO2: 97+% on room air, During ambulation: highest recorded HR: 86-111 bpm, SpO2: 96+% on room air, Post session: HR: 71 bpm, SpO2: 99+% on room air. Pre session: L groin incision: dry sanguinous saturations throughout bandaging, borders are dry,clean and intact. R groin incision: small, dry sanguinous saturations on top of incision, borders are dry,clean and intact. Post session: L groin incision: small sanguinous excretions at center of bandage borders remain dry,clean and intact. R groin incision: no change from start of session.    Exercises     Assessment/Plan    PT Assessment Patient needs continued PT services  PT  Problem List Decreased strength;Decreased balance;Decreased knowledge of precautions;Pain;Decreased skin integrity;Decreased safety awareness;Decreased activity tolerance;Decreased range of motion;Decreased mobility       PT Treatment Interventions DME instruction;Gait training;Stair training;Functional mobility training;Therapeutic exercise;Therapeutic activities;Balance training;Patient/family education    PT Goals (Current goals can be found in the Care Plan section)  Acute  Rehab PT Goals Patient Stated Goal: To get out of here PT Goal Formulation: With patient Time For Goal Achievement: 03/06/24 Potential to Achieve Goals: Good    Frequency Min 2X/week     Co-evaluation               AM-PAC PT 6 Clicks Mobility  Outcome Measure Help needed turning from your back to your side while in a flat bed without using bedrails?: None Help needed moving from lying on your back to sitting on the side of a flat bed without using bedrails?: None Help needed moving to and from a bed to a chair (including a wheelchair)?: A Little Help needed standing up from a chair using your arms (e.g., wheelchair or bedside chair)?: A Little Help needed to walk in hospital room?: A Little Help needed climbing 3-5 steps with a railing? : A Lot 6 Click Score: 19    End of Session Equipment Utilized During Treatment: Gait belt Activity Tolerance: Patient limited by pain Patient left: in bed;with bed alarm set;with call bell/phone within reach Nurse Communication: Mobility status;Precautions;Other (comment) (sanguinous secertions from R groin incision) PT Visit Diagnosis: Unsteadiness on feet (R26.81);Muscle weakness (generalized) (M62.81);Pain Pain - Right/Left:  (both) Pain - part of body: Knee;Hip;Leg    Time: 1113-1130 PT Time Calculation (min) (ACUTE ONLY): 17 min   Charges:                 Hagen Tidd, SPT 02/21/24, 1:12 PM

## 2024-02-21 NOTE — Progress Notes (Signed)
 Patient was transferred with no distress after report given to [facility] on 1C. He was transferred to room 128 with all belongings (clothes) and medications. Mallissa, Niece, made aware. Will transfer care at this time.

## 2024-02-21 NOTE — Plan of Care (Signed)
  Problem: Education: Goal: Knowledge of General Education information will improve Description: Including pain rating scale, medication(s)/side effects and non-pharmacologic comfort measures Outcome: Progressing   Problem: Health Behavior/Discharge Planning: Goal: Ability to manage health-related needs will improve Outcome: Progressing   Problem: Clinical Measurements: Goal: Ability to maintain clinical measurements within normal limits will improve Outcome: Progressing Goal: Will remain free from infection Outcome: Progressing Goal: Diagnostic test results will improve Outcome: Progressing Goal: Respiratory complications will improve Outcome: Progressing Goal: Cardiovascular complication will be avoided Outcome: Progressing   Problem: Activity: Goal: Risk for activity intolerance will decrease Outcome: Progressing   Problem: Nutrition: Goal: Adequate nutrition will be maintained Outcome: Progressing   Problem: Coping: Goal: Level of anxiety will decrease Outcome: Progressing   Problem: Elimination: Goal: Will not experience complications related to bowel motility Outcome: Progressing Goal: Will not experience complications related to urinary retention Outcome: Progressing   Problem: Pain Managment: Goal: General experience of comfort will improve and/or be controlled Outcome: Progressing   Problem: Safety: Goal: Ability to remain free from injury will improve Outcome: Progressing   Problem: Skin Integrity: Goal: Risk for impaired skin integrity will decrease Outcome: Progressing   Problem: Education: Goal: Knowledge of prescribed regimen will improve Outcome: Progressing   Problem: Activity: Goal: Ability to tolerate increased activity will improve Outcome: Progressing   Problem: Bowel/Gastric: Goal: Gastrointestinal status for postoperative course will improve Outcome: Progressing   Problem: Clinical Measurements: Goal: Postoperative complications  will be avoided or minimized Outcome: Progressing Goal: Signs and symptoms of graft occlusion will improve Outcome: Progressing   Problem: Skin Integrity: Goal: Demonstration of wound healing without infection will improve Outcome: Progressing   Problem: Education: Goal: Ability to describe self-care measures that may prevent or decrease complications (Diabetes Survival Skills Education) will improve Outcome: Progressing Goal: Individualized Educational Video(s) Outcome: Progressing   Problem: Coping: Goal: Ability to adjust to condition or change in health will improve Outcome: Progressing   Problem: Fluid Volume: Goal: Ability to maintain a balanced intake and output will improve Outcome: Progressing   Problem: Health Behavior/Discharge Planning: Goal: Ability to identify and utilize available resources and services will improve Outcome: Progressing Goal: Ability to manage health-related needs will improve Outcome: Progressing   Problem: Metabolic: Goal: Ability to maintain appropriate glucose levels will improve Outcome: Progressing   Problem: Nutritional: Goal: Maintenance of adequate nutrition will improve Outcome: Progressing Goal: Progress toward achieving an optimal weight will improve Outcome: Progressing   Problem: Skin Integrity: Goal: Risk for impaired skin integrity will decrease Outcome: Progressing   Problem: Tissue Perfusion: Goal: Adequacy of tissue perfusion will improve Outcome: Progressing

## 2024-02-21 NOTE — Anesthesia Postprocedure Evaluation (Signed)
 Anesthesia Post Note  Patient: Mark Nash  Procedure(s) Performed: ENDARTERECTOMY, FEMORAL (Bilateral) APPLICATION OF CELL SAVER ANGIOGRAM, LOWER EXTREMITY (Right) INSERTION, STENT, INTRAVASCULAR, PERCUTANEOUS, WITH IMAGING GUIDANCE (Right)  Anesthesia Type: General Level of consciousness: awake, oriented and awake and alert Pain management: satisfactory to patient Vital Signs Assessment: post-procedure vital signs reviewed and stable Respiratory status: spontaneous breathing Cardiovascular status: stable Postop Assessment: no apparent nausea or vomiting, adequate PO intake and able to ambulate Anesthetic complications: no  No notable events documented.   Last Vitals:  Vitals:   02/21/24 0600 02/21/24 0630  BP: 110/66 (!) 116/58  Pulse: 65 66  Resp: 14 (!) 21  Temp:    SpO2: 98% 99%    Last Pain:  Vitals:   02/21/24 0453  TempSrc:   PainSc: 0-No pain                 [name] Lan

## 2024-02-21 NOTE — Consult Note (Signed)
 Cardiology Consultation:   Patient ID: Mark Nash; [id number]; 07-Oct-1955   Admit date: 02/20/2024 Date of Consult: 02/21/2024  Primary Care Provider: Sowles, Krichna, MD Primary Cardiologist: Perla Primary Electrophysiologist:  None   Patient Profile:   Mark Nash is a 68 y.o. male with a hx of nonobstructive CAD by LHC in 01/2023, HFrEF secondary to NICM possibly tachycardia induced, permanent A-fib/flutter diagnosed in 2022, schizoaffective/schizophrenia disorder, PAD, HTN, HLD, DM2, hepatitis C, COPD, and tobacco use  who is being seen today for the evaluation of Afib at the request of Gwendlyn Shank, NP.  History of Present Illness:   [name] was diagnosed with A-fib/flutter in 02/2021.  Echo in 03/2021 showed an EF of 55 to 60%, no regional wall motion abnormalities, normal RV systolic function and ventricular cavity size, and trivial mitral regurgitation.  Lexiscan  MPI in 04/2021 showed no significant ischemia with an EF of 59% and was overall low risk.  CT attenuation corrected images showed three-vessel coronary artery calcification.  He was evaluated by EP and initially scheduled for A-fib ablation, though this was canceled due to lower extremity wounds and swelling, and has not been revisited.  Previously failed DCCV.  He has been rate controlled since and has been asymptomatic.  Echo in 06/2021 showed an EF of 55 to 60%, no regional wall motion abnormalities, normal RV systolic function and ventricular cavity size, mildly dilated left atrium, aortic valve sclerosis without evidence of stenosis, dilated aortic root measuring 36 mm, dilated ascending aorta measuring 37 mm, and an estimated right atrial pressure of 3 mmHg.  Echo in 08/2022 showed an EF of 45 to 50%, global hypokinesis, mildly dilated LV internal cavity size, normal RV systolic function, mild biatrial enlargement, and an estimated right atrial pressure of 15 mmHg.  Rhythm during study was atrial flutter.  Lexiscan  MPI  in 12/2022 showed no evidence of significant ischemia and was felt to overall be a low risk MPI.     He was admitted to the hospital in 12/2022 with COPD exacerbation and diagnosed with rhinovirus and possibly pneumonia.  A-fib/flutter with RVR was difficult to control.  Limited echo from 12/2022 showed an EF of 30 to 35% normal RV systolic function and ventricular cavity size, moderately dilated left atrium, and mildly dilated aortic root measuring 41 mm.  R/LHC in 01/2023 showed mildly calcified coronary artery with mild nonobstructive CAD.  RHC showed normal filling pressures, mild pulmonary hypertension, and normal cardiac output.  Medical therapy was recommended for nonischemic cardiomyopathy felt to be tachycardia induced.  Limited echo from 04/2023 showed an EF of 50 to 55%, no regional wall motion abnormalities, normal RV systolic function, ventricular cavity size, and RVSP, mild to moderate tricuspid regurgitation, and an estimated right atrial pressure of 3 mmHg.     In the setting of underlying PAD with progressive claudication he underwent lower extremity angiography by vascular surgery in 10/2023 with significant bilateral lower extremity PAD with mentation for hybrid procedure including right femoral endarterectomy as well as right popliteal intervention.  He was last seen in the office in 11/2023 and was doing well from a cardiac perspective, without symptoms of angina or cardiac decompensation.  No further cardiac testing was indicated at this time.  He presented for planned lower extremity angiography on 02/20/2024 by vascular surgery and underwent bilateral femoral endarterectomy and stenting to the right popliteal artery without cardiac complication.  Postprocedure, patient remained in A-fib (per minute) with ventricular rates in the low 100s bpm  initially, subsequently trending to the 70s to 90s bpm.  He is completely asymptomatic.  No symptoms of angina, cardiac decompensation, palpitations,  dizziness, presyncope, or syncope.  He has been maintained on PTA Toprol  and digoxin .  Vascular surgery is resuming apixaban  this morning.    Past Medical History:  Diagnosis Date   Aortic atherosclerosis (HCC)    Aortic root dilatation (HCC)    Arthritis    Asthma    Atrial fibrillation and flutter (HCC)    a.) CHA2DS2VASc = 5 (age, HFrEF, HTN, vascular disease, T2DM) as of 02/19/2024; b.) s/p DCCV 05/20/2021 --> 150 J x 1 and 200 J x 3 (converted to NSR after 1st 200 J x 1 min with ERAF); c.) rate/rhythm maintained on oral digoxin  + metoprolol  succinate; chronically anticoagulated with apixaban    Calcaneal spur    Colon polyps    Constipation    COPD (chronic obstructive pulmonary disease) (HCC)    Coronary artery disease    DDD (degenerative disc disease), lumbar    a.) s/p L3-L5 laminectomy/decompression in 2017   Diverticulosis    Dyspnea    GERD (gastroesophageal reflux disease)    Hepatitis C    a.) has not been Tx'd; b.) endorses IVDU ages [age]   HFrEF (heart failure with reduced ejection fraction) (HCC)    History of intravenous drug abuse    a.) age [age]   History of kidney stones    History of right cataract extraction    Hyperlipidemia    Hypertension 06/10/2018   Left inguinal hernia    NICM (nonischemic cardiomyopathy) (HCC)    On apixaban  therapy    Peripheral vascular disease (HCC)    Pulmonary hypertension (HCC)    Restless legs    Schizoaffective disorder (HCC)    a.) refuses to consult with psychiatry per PCP   Status post bilateral inguinal hernia repair    T2DM (type 2 diabetes mellitus) (HCC)    Thrombocytopenia (HCC)    Vitamin D  deficiency    Wears full dentures     Past Surgical History:  Procedure Laterality Date   CARDIOVERSION N/A 05/20/2021   Procedure: CARDIOVERSION;  Surgeon: Darron Deatrice LABOR, MD;  Location: [facility];  Service: Cardiovascular;  Laterality: N/A;   CATARACT EXTRACTION W/PHACO Right 10/31/2021   Procedure: CATARACT  EXTRACTION PHACO AND INTRAOCULAR LENS PLACEMENT (IOC) RIGHT DIABETIC;  Surgeon: Myrna Adine Anes, MD;  Location: [facility];  Service: Ophthalmology;  Laterality: Right;  Diabetic 4.34 00:42.8   COLONOSCOPY WITH PROPOFOL  N/A 09/07/2017   Procedure: COLONOSCOPY WITH PROPOFOL ;  Surgeon: Janalyn Keene NOVAK, MD;  Location: [facility];  Service: Endoscopy;  Laterality: N/A;   ESOPHAGOGASTRODUODENOSCOPY (EGD) WITH PROPOFOL  N/A 12/07/2017   Procedure: ESOPHAGOGASTRODUODENOSCOPY (EGD) WITH PROPOFOL ;  Surgeon: Janalyn Keene NOVAK, MD;  Location: [facility];  Service: Endoscopy;  Laterality: N/A;   FLEXIBLE SIGMOIDOSCOPY N/A 11/09/2017   Procedure: FLEXIBLE SIGMOIDOSCOPY;  Surgeon: Janalyn Keene NOVAK, MD;  Location: [facility];  Service: Endoscopy;  Laterality: N/A;   INGUINAL HERNIA REPAIR Bilateral 06/21/2018   Procedure: LAPAROSCOPIC BILATERAL INGUINAL HERNIA REPAIR;  Surgeon: Nicholaus Selinda Birmingham, MD;  Location: [facility];  Service: General;  Laterality: Bilateral;   L3 TO L5 LAMINECTOMY FOR DECOMPRESSION  07/17/2016   [facility]   LOWER EXTREMITY ANGIOGRAPHY Right 11/05/2023   Procedure: Lower Extremity Angiography;  Surgeon: Marea Selinda RAMAN, MD;  Location: [facility];  Service: Cardiovascular;  Laterality: Right;   REPAIR OF CEREBROSPINAL FLUID LEAK N/A 09/08/2016  Procedure: Lumbar wound exploration, repair of pseudomenigocele;  Surgeon: Morene Hicks Ditty, MD;  Location: [facility];  Service: Neurosurgery;  Laterality: N/A;  Lumbar wound exploration, repair of pseudomenigocele   RIGHT/LEFT HEART CATH AND CORONARY ANGIOGRAPHY N/A 01/29/2023   Procedure: RIGHT/LEFT HEART CATH AND CORONARY ANGIOGRAPHY;  Surgeon: Darron Deatrice LABOR, MD;  Location: [facility];  Service: Cardiovascular;  Laterality: N/A;   SINUSOTOMY     as a child     Home Meds: Prior to Admission medications   Medication Sig Start Date End Date Taking? Authorizing Provider   blood glucose meter kit and supplies KIT Dispense based on patient and insurance preference. Use up to tid E11.65, LON:99 05/10/22  Yes Sowles, Krichna, MD  Blood Glucose Monitoring Suppl (ONETOUCH VERIO FLEX SYSTEM) w/Device KIT Use to check blood sugar up to four times daily as directed 10/10/23  Yes Sowles, Krichna, MD  Budeson-Glycopyrrol-Formoterol  (BREZTRI  AEROSPHERE) 160-9-4.8 MCG/ACT AERO Inhale 2 puffs into the lungs 2 (two) times daily. Patient receives via [employer] Patient Assistance. 09/19/22  Yes Sowles, Krichna, MD  digoxin  (LANOXIN ) 0.125 MG tablet Take 0.5 tablets (0.0625 mg total) by mouth daily. 04/25/23  Yes Quadarius Henton, Bernardino HERO, PA-C  gabapentin  (NEURONTIN ) 300 MG capsule Take 300-600 mg by mouth See admin instructions. Take 1 capsule (300 mg) by mouth in the morning, take 1 capsule (300 mg) by mouth at noon, and 2 capsules (600 mg) by mouth in the evening   Yes [provider]  glucose blood (ONETOUCH VERIO) test strip Use to check blood sugar up to four times daily as directed 10/10/23  Yes Sowles, Krichna, MD  Insulin  Degludec (TRESIBA ) 100 UNIT/ML SOLN Inject 8 Units into the skin daily at 12 noon. 10/01/23  Yes Sowles, Krichna, MD  Lancet Devices MISC 1 each by Does not apply route 3 (three) times daily as needed. What ins will cover dx:E11.65, LON:99 tid 09/19/21  Yes Bernardo Fend, DO  Lancets [facility] DELICA PLUS [place]) MISC Use to check blood sugar up to four times daily as directed 10/10/23  Yes Sowles, Krichna, MD  losartan  (COZAAR ) 25 MG tablet Take 0.5 tablets (12.5 mg total) by mouth daily. Skip the dose if systolic BP less than 110 mmHg 02/27/23 10/09/24 Yes West, Katlyn D, NP  metoprolol  (TOPROL -XL) 200 MG 24 hr tablet Take 1 tablet (200 mg total) by mouth daily. Take with or immediately following a meal. 01/18/24  Yes Grayden Burley M, PA-C  omeprazole  (PRILOSEC) 20 MG capsule Take 20 mg by mouth 3 (three) times daily. 12/15/23  Yes [provider]  pravastatin   (PRAVACHOL ) 20 MG tablet Take 1 tablet (20 mg total) by mouth daily with supper. 07/20/23  Yes Sowles, Krichna, MD  tiZANidine (ZANAFLEX) 4 MG tablet Take 4 mg by mouth every 12 (twelve) hours.   Yes Fernand Elfreda LABOR, MD  traMADol  (ULTRAM ) 50 MG tablet Take 50 mg by mouth every 12 (twelve) hours.   Yes Fernand Elfreda A, MD  triamcinolone  cream (KENALOG ) 0.1 % Apply topically 2 (two) times daily. 04/09/23  Yes [provider]  Vitamin D , Ergocalciferol , (DRISDOL ) 1.25 MG (50000 UNIT) CAPS capsule TAKE ONE CAPSULE BY MOUTH EVERY 14 DAYS 01/02/24  Yes Sowles, Krichna, MD  apixaban  (ELIQUIS ) 5 MG TABS tablet Take 1 tablet by mouth twice daily 12/19/23   Gollan, Timothy J, MD  dapagliflozin  propanediol (FARXIGA ) 10 MG TABS tablet Take 1 tablet (10 mg total) by mouth daily. 11/30/22   Sowles, Krichna, MD  Semaglutide ,0.25 or  0.5MG /DOS, (OZEMPIC , 0.25 OR 0.5 MG/DOSE,) 2 MG/1.5ML SOPN Inject 0.25 mg into the skin once a week. Patient taking differently: Inject 0.5 mg into the skin once a week. 10/11/23   Sowles, Krichna, MD    Inpatient Medications: Scheduled Meds:  apixaban   5 mg Oral BID   budesonide -glycopyrrolate-formoterol   2 puff Inhalation BID   Chlorhexidine  Gluconate Cloth  6 each Topical Daily   dapagliflozin  propanediol  10 mg Oral Daily   digoxin   0.0625 mg Oral Daily   docusate sodium   100 mg Oral Daily   gabapentin   300 mg Oral BID   And   gabapentin   600 mg Oral QHS   insulin  aspart  0-15 Units Subcutaneous TID WC   insulin  aspart  0-5 Units Subcutaneous QHS   insulin  glargine-yfgn  8 Units Subcutaneous QHS   losartan   12.5 mg Oral Daily   metoprolol   200 mg Oral Daily   pantoprazole   40 mg Oral Daily   pravastatin   20 mg Oral QHS   tiZANidine  4 mg Oral Q12H   Continuous Infusions:  sodium chloride  100 mL/hr at 02/21/24 0600   sodium chloride      DOPamine     famotidine  (PEPCID ) IV Stopped (02/20/24 2231)   magnesium  sulfate bolus IVPB     nitroGLYCERIN     PRN  Meds: sodium chloride , alum & mag hydroxide-simeth, guaiFENesin -dextromethorphan , hydrALAZINE, magnesium  sulfate bolus IVPB, metoprolol  tartrate, morphine  injection, ondansetron , oxyCODONE -acetaminophen , phenol, potassium chloride , senna-docusate, sorbitol  Allergies:   Allergies  Allergen Reactions   Penicillins Hives and Swelling    Childhood allergy.  Has tolerated 3rd generation cephalosporin (CEFTRIAXONE ) without documented ADRs   Aspirin  Nausea Only   Ibuprofen Nausea Only   Lyrica  [Pregabalin ] Nausea Only   Acetaminophen  Nausea Only    Social History:   Social History   Socioeconomic History   Marital status: Divorced    Spouse name: Not on file   Number of children: 2   Years of education: Not on file   Highest education level: 12th grade  Occupational History   Occupation: Disabled  Tobacco Use   Smoking status: Every Day    Current packs/day: 1.50    Average packs/day: 1.5 packs/day for 46.0 years (69.0 ttl pk-yrs)    Types: Cigarettes   Smokeless tobacco: Never  Vaping Use   Vaping status: Never Used  Substance and Sexual Activity   Alcohol use: No    Alcohol/week: 0.0 standard drinks of alcohol   Drug use: No   Sexual activity: Not Currently  Other Topics Concern   Not on file  Social History Narrative   Lives alone   Social Drivers of Health   Financial Resource Strain: Low Risk  (10/08/2023)   Received from [facility]   Overall Financial Resource Strain (CARDIA)    Difficulty of Paying Living Expenses: Not very hard  Food Insecurity: No Food Insecurity (02/20/2024)   Hunger Vital Sign    Worried About Running Out of Food in the Last Year: Never true    Ran Out of Food in the Last Year: Never true  Transportation Needs: No Transportation Needs (02/20/2024)   PRAPARE - Administrator, Civil Service (Medical): No    Lack of Transportation (Non-Medical): No  Physical Activity: Insufficiently Active (10/04/2023)   Exercise  Vital Sign    Days of Exercise per Week: 1 day    Minutes of Exercise per Session: 10 min  Stress: No Stress Concern Present (10/04/2023)  [employer] of Occupational Health - Occupational Stress Questionnaire    Feeling of Stress : Not at all  Social Connections: Socially Isolated (02/20/2024)   Social Connection and Isolation Panel    Frequency of Communication with Friends and Family: Once a week    Frequency of Social Gatherings with Friends and Family: Never    Attends Religious Services: Never    Database administrator or Organizations: No    Attends Banker Meetings: Never    Marital Status: Divorced  Catering manager Violence: Not At Risk (02/20/2024)   Humiliation, Afraid, Rape, and Kick questionnaire    Fear of Current or Ex-Partner: No    Emotionally Abused: No    Physically Abused: No    Sexually Abused: No     Family History:   Family History  Problem Relation Age of Onset   Heart failure Mother    Hypertension Mother    Diverticulitis Mother    Colitis Mother    Diabetes Father    Hypertension Father    Heart disease Father    Diabetes Sister    Heart failure Brother    Diverticulitis Brother     ROS:  Review of Systems  Constitutional:  Positive for malaise/fatigue. Negative for chills, diaphoresis, fever and weight loss.  HENT:  Negative for congestion.   Eyes:  Negative for discharge and redness.  Respiratory:  Negative for cough, sputum production, shortness of breath and wheezing.   Cardiovascular:  Positive for claudication. Negative for chest pain, palpitations, orthopnea, leg swelling and PND.  Gastrointestinal:  Negative for abdominal pain, blood in stool, heartburn, melena, nausea and vomiting.  Musculoskeletal:  Negative for falls and myalgias.  Skin:  Negative for rash.  Neurological:  Negative for dizziness, tingling, tremors, sensory change, speech change, focal weakness, loss of consciousness and weakness.   Endo/Heme/Allergies:  Does not bruise/bleed easily.  Psychiatric/Behavioral:  Negative for substance abuse. The patient is not nervous/anxious.   All other systems reviewed and are negative.     Physical Exam/Data:   Vitals:   02/21/24 0500 02/21/24 0530 02/21/24 0600 02/21/24 0630  BP: 111/65 115/60 110/66 (!) 116/58  Pulse: 68 77 65 66  Resp: 13 (!) 22 14 (!) 21  Temp:      TempSrc:      SpO2: 100% 98% 98% 99%  Weight:      Height:        Intake/Output Summary (Last 24 hours) at 02/21/2024 0816 Last data filed at 02/21/2024 0600 Gross per 24 hour  Intake 2903.88 ml  Output 1840 ml  Net 1063.88 ml   Filed Weights   02/20/24 0630  Weight: 67.9 kg   Body mass index is 20.88 kg/m.   Physical Exam: General: Well developed, well nourished, in no acute distress. Head: Normocephalic, atraumatic, sclera non-icteric, no xanthomas, nares without discharge.  Neck: Negative for carotid bruits. JVD not elevated. Lungs: Clear bilaterally to auscultation without wheezes, rales, or rhonchi. Breathing is unlabored. Heart: IRIR with S1 S2. No murmurs, rubs, or gallops appreciated. Abdomen: Soft, non-tender, non-distended with normoactive bowel sounds. No hepatomegaly. No rebound/guarding. No obvious abdominal masses. Msk:  Strength and tone appear normal for age. Extremities: No clubbing or cyanosis. No edema.  Neuro: Alert and oriented X 3. No facial asymmetry. No focal deficit. Moves all extremities spontaneously. Psych:  Responds to questions appropriately with a normal affect.   EKG:  The EKG was personally reviewed and demonstrates: Not performed preprocedure Telemetry:  Telemetry  was personally reviewed and demonstrates: A-fib with ventricular rates largely in the 70s to 90s bpm  Weights: Filed Weights   02/20/24 0630  Weight: 67.9 kg    Relevant CV Studies:  Limited echo 05/09/2023: 1. Left ventricular ejection fraction, by estimation, is 50 to 55%. The  left ventricle  has low normal function. The left ventricle has no regional  wall motion abnormalities. Left ventricular diastolic parameters are  indeterminate.   2. Right ventricular systolic function is normal. The right ventricular  size is normal. There is normal pulmonary artery systolic pressure. The  estimated right ventricular systolic pressure is 21.6 mmHg.   3. The mitral valve is normal in structure. No evidence of mitral valve  regurgitation. No evidence of mitral stenosis.   4. Tricuspid valve regurgitation is mild to moderate.   5. The aortic valve is normal in structure. Aortic valve regurgitation is  not visualized. No aortic stenosis is present.   6. The inferior vena cava is normal in size with greater than 50%  respiratory variability, suggesting right atrial pressure of 3 mmHg.  __________   [facility] 01/29/2023: 1.  Mildly calcified coronary arteries with mild nonobstructive coronary artery disease. 2.  Left ventricular angiography was not performed.  EF was moderately reduced by echo. 3.  Right heart catheterization showed normal filling pressures, mild pulmonary hypertension and normal cardiac output.   Recommendations: The patient has nonischemic cardiomyopathy likely tachycardia induced.  Recommend medical therapy. Continue Toprol  for anticoagulation. Resume heparin  2 hours after sheath pull and likely can transition to Eliquis  later this evening. __________   Limited echo 01/26/2023: 1. Left ventricular ejection fraction, by estimation, is 30 to 35%. The  left ventricle has moderately decreased function. Left ventricular  endocardial border not optimally defined to evaluate regional wall motion.   2. Right ventricular systolic function is normal. The right ventricular  size is normal.   3. Left atrial size was moderately dilated.   4. The mitral valve is normal in structure. No evidence of mitral valve  regurgitation. No evidence of mitral stenosis.   5. The aortic valve is  calcified. Aortic valve regurgitation is not  visualized. No aortic stenosis is present.   6. Aortic dilatation noted. There is mild dilatation of the aortic root,  measuring 41 mm.   7. Challenging image quality. Most valves not well seen.  __________   Lexiscan  MPI 01/01/2023:   Sensitivity and specificity of the study are significant reduced due to artifact.  The resting images are uninterpretable due to severe extracardiac activity.   Abnormal, probably low risk pharmacologic myocardial.   There is a small in size, mild in severity, fixed apical anterior and apical defect most consistent with artifact but cannot rule out an element of scar.   There is no evidence of significant ischemia.   Left ventricular systolic function is moderately reduced (LVEF 35-45%).   Coronary artery calcification and aortic atherosclerosis are present.   No significant change from prior study on 05/12/2021 allowing for differences in artifact. __________   2D echo 09/11/2022: 1. Left ventricular ejection fraction, by estimation, is 45 to 50%. The  left ventricle has mildly decreased function. The left ventricle  demonstrates global hypokinesis. The left ventricular internal cavity size  was mildly dilated. Left ventricular  diastolic parameters are indeterminate.   2. Right ventricular systolic function is normal. The right ventricular  size is not well visualized.   3. Left atrial size was mildly dilated.   4. Right  atrial size was mildly dilated.   5. The mitral valve is grossly normal. No evidence of mitral valve  regurgitation.   6. The aortic valve is calcified. Aortic valve regurgitation is not  visualized.   7. The inferior vena cava is dilated in size with <50% respiratory  variability, suggesting right atrial pressure of 15 mmHg.   8. Rhythm strip during this exam demonstrates atrial flutter.   Comparison(s): 06/28/21 EF 55-60%.  __________   2D echo 06/28/2021: 1. Left ventricular ejection  fraction, by estimation, is 55 to 60%. The  left ventricle has normal function. The left ventricle has no regional  wall motion abnormalities. Left ventricular diastolic parameters are  indeterminate.   2. Right ventricular systolic function is normal. The right ventricular  size is normal.   3. Left atrial size was mildly dilated.   4. The mitral valve was not well visualized. No evidence of mitral valve  regurgitation. No evidence of mitral stenosis.   5. The aortic valve was not well visualized. Aortic valve regurgitation  is not visualized. Mild to moderate aortic valve sclerosis/calcification  is present, without any evidence of aortic stenosis.   6. There is dilatation of the ascending aorta, measuring 37 mm. There is  dilatation of the aortic root, measuring 36 mm.   7. The inferior vena cava is normal in size with greater than 50%  respiratory variability, suggesting right atrial pressure of 3 mmHg.   Comparison(s): EF 55%, technically challenging due to limited acoustic  windows.  __________   Lexiscan  MPI 05/12/2021: Pharmacological myocardial perfusion imaging study with no significant  ischemia Attenuation corrected images with small fixed defect in the apical region, possible artifact though unable to exclude small scar This small fixed defect not noted on nonattenuation corrected images.  Normal wall motion, EF estimated at 59% Resting EKG with atrial fibrillation No EKG changes concerning for ischemia at peak stress or in recovery. CT attenuation correction images with moderate three-vessel coronary calcification Low risk scan __________   2D echo 04/20/2021 [place]): 1. Normal LVF.   2. Left ventricular ejection fraction, by estimation, is 55 to 60%. Left  ventricular ejection fraction by PLAX is 55 %. The left ventricle has  normal function. The left ventricle has no regional wall motion  abnormalities. Left ventricular diastolic  function could not be  evaluated.   3. Right ventricular systolic function is normal. The right ventricular  size is normal.   4. The mitral valve is grossly normal. Trivial mitral valve  regurgitation.   5. The aortic valve is normal in structure. Aortic valve regurgitation is  not visualized.   Laboratory Data:  Chemistry Recent Labs  Lab 02/21/24 0010  NA 136  K 4.0  CL 104  CO2 24  GLUCOSE 184*  BUN 19  CREATININE 0.75  CALCIUM 8.5*  GFRNONAA >60  ANIONGAP 8    No results for input(s): PROT, ALBUMIN, AST, ALT, ALKPHOS, BILITOT in the last 168 hours. Hematology Recent Labs  Lab 02/21/24 0010  WBC 8.3  RBC 4.93  HGB 14.9  HCT 45.1  MCV 91.5  MCH 30.2  MCHC 33.0  RDW 13.8  PLT 136*   Cardiac EnzymesNo results for input(s): TROPONINI in the last 168 hours. No results for input(s): TROPIPOC in the last 168 hours.  BNPNo results for input(s): BNP, PROBNP in the last 168 hours.  DDimer No results for input(s): DDIMER in the last 168 hours.  Radiology/Studies:  DG C-Arm 1-60 Min-No Report Result  Date: 02/20/2024 Fluoroscopy was utilized by the requesting physician.  No radiographic interpretation.    Assessment and Plan:   1. Permanent Afib: -Patient failed DCCV in 2022 and has been maintained in Afib since -Asymptomatic -Initially post procedure, ventricular rates were mildly elevated in the 1 teens -Well controlled ventricular rates in the 70s to 90s bpm currently -Continue current outpatient regimen of Toprol  XL 200 mg and digoxin  -Digoxin  level 0.7 in 11/2023, update -Resuming Eliquis  per vascular surgery in the post-procedure setting - CHA2DS2-VASc is 5  2.  Nonobstructive CAD: - Without symptoms concerning for angina or cardiac decompensation - Remains on apixaban  and lieu of aspirin  given underlying A-fib/flutter - Continue Toprol  and pravastatin  - No plans for inpatient ischemic testing  3.  HTN: - Blood pressure is well-controlled - PTA losartan   and metoprolol   4.  HLD: - LDL 42 in 10/2023 - PTA pravastatin   5.  PAD: - Status post bilateral femoral endarterectomies and stenting to the right popliteal artery on 6/25 -Per vascular surgery       For questions or updates, please contact [facility] Please consult [email or web address] for contact info under Cardiology/STEMI.   Signed, Bernardino Bring, PA-C [facility] Pager: [phone] 02/21/2024, 8:16 AM

## 2024-02-21 NOTE — Progress Notes (Signed)
 Occupational Therapy Evaluation Patient Details Name: Mark Nash MRN: [id number] DOB: 1956/01/27 Today's Date: 02/21/2024   History of Present Illness   Pt is a 68 y.o. male who presents for L and R femoral artery endarterectomies and stent placement. PMH: hypertension,HLD, type 2 diabetes, PAD, COPD/asthma not O2 dependent, atrial fibrillation on chronic anticoagulation with apixaban , CHFef 40-45%, hepc C, schizoaffective/schizophrenia disorder, tobacco use     Clinical Impressions Mark Nash was seen for OT evaluation this date. Prior to hospital admission, pt was IND in ADLs. Pt lives alone. Pt presents to acute OT demonstrating impaired ADL performance and functional mobility 2/2 generalized weakness and decreased activity tolerance (See OT problem list for additional functional deficits). Pt currently requires SUPERVISION for bed mobility; MIN A + RW for STS. Pt ambulated w/ MIN A + RW for ~15 ft before deferring further mobility due to pain. Pt would benefit from skilled OT services to address noted impairments and functional limitations (see below for any additional details) in order to maximize safety and independence while minimizing falls risk and caregiver burden. Anticipate the need for follow up OT services upon acute hospital DC.      If plan is discharge home, recommend the following:   A little help with walking and/or transfers;A little help with bathing/dressing/bathroom;Assistance with cooking/housework;Assist for transportation;Help with stairs or ramp for entrance     Functional Status Assessment   Patient has had a recent decline in their functional status and demonstrates the ability to make significant improvements in function in a reasonable and predictable amount of time.     Equipment Recommendations   BSC/3in1     Recommendations for Other Services         Precautions/Restrictions   Precautions Precautions: Fall Recall of  Precautions/Restrictions: Intact Restrictions Weight Bearing Restrictions Per Provider Order: No     Mobility Bed Mobility Overal bed mobility: Needs Assistance Bed Mobility: Supine to Sit     Supine to sit: Contact guard, HOB elevated          Transfers Overall transfer level: Needs assistance Equipment used: Rolling walker (2 wheels) Transfers: Sit to/from Stand Sit to Stand: Min assist                  Balance Overall balance assessment: Needs assistance Sitting-balance support: Feet supported, No upper extremity supported Sitting balance-Leahy Scale: Good     Standing balance support: Bilateral upper extremity supported, Reliant on assistive device for balance Standing balance-Leahy Scale: Fair                             ADL either performed or assessed with clinical judgement   ADL Overall ADL's : Needs assistance/impaired                                       General ADL Comments: Anticipate MIN A for toileting, MAX A for LB dressing     Vision         Perception         Praxis         Pertinent Vitals/Pain Pain Assessment Pain Assessment: 0-10 Pain Score: 8  Pain Location: L knee/thigh Pain Descriptors / Indicators: Discomfort, Grimacing, Shooting Pain Intervention(s): Limited activity within patient's tolerance, Monitored during session, Repositioned, RN gave pain meds during session     Extremity/Trunk Assessment Upper  Extremity Assessment Upper Extremity Assessment: Overall WFL for tasks assessed   Lower Extremity Assessment Lower Extremity Assessment: Defer to PT evaluation       Communication Communication Communication: No apparent difficulties   Cognition Arousal: Alert Behavior During Therapy: WFL for tasks assessed/performed Cognition: No apparent impairments                               Following commands: Intact       Cueing  General Comments   Cueing Techniques:  Verbal cues;Tactile cues      Exercises     Shoulder Instructions      Home Living Family/patient expects to be discharged to:: Private residence Living Arrangements: Alone Available Help at Discharge: Family;Available PRN/intermittently Type of Home: Apartment Home Access: Stairs to enter Entrance Stairs-Number of Steps: 5 Entrance Stairs-Rails: Left Home Layout: One level     Bathroom Shower/Tub: Chief Strategy Officer: Standard     Home Equipment: Grab bars - tub/shower;Shower seat          Prior Functioning/Environment Prior Level of Function : Independent/Modified Independent;Driving               ADLs Comments: IND in ADLs    OT Problem List: Decreased strength;Decreased activity tolerance;Impaired balance (sitting and/or standing);Pain   OT Treatment/Interventions: Self-care/ADL training;Patient/family education      OT Goals(Current goals can be found in the care plan section)   Acute Rehab OT Goals Patient Stated Goal: to go home OT Goal Formulation: With patient Time For Goal Achievement: 03/06/24 Potential to Achieve Goals: Good ADL Goals Pt Will Perform Grooming: with modified independence;standing Pt Will Perform Lower Body Dressing: with min assist;sitting/lateral leans;sit to/from stand Pt Will Transfer to Toilet: with modified independence;ambulating;regular height toilet   OT Frequency:  Min 2X/week    Co-evaluation              AM-PAC OT 6 Clicks Daily Activity     Outcome Measure Help from another person eating meals?: None Help from another person taking care of personal grooming?: None Help from another person toileting, which includes using toliet, bedpan, or urinal?: A Little Help from another person bathing (including washing, rinsing, drying)?: A Lot Help from another person to put on and taking off regular upper body clothing?: None Help from another person to put on and taking off regular lower body  clothing?: A Lot 6 Click Score: 19   End of Session Equipment Utilized During Treatment: Gait belt;Rolling walker (2 wheels)  Activity Tolerance: Patient tolerated treatment well;Patient limited by pain Patient left: in chair;with call bell/phone within reach;with nursing/sitter in room  OT Visit Diagnosis: Unsteadiness on feet (R26.81);Other abnormalities of gait and mobility (R26.89);Muscle weakness (generalized) (M62.81);Pain Pain - Right/Left: Left Pain - part of body: Knee;Leg                Time: 9151-9092 OT Time Calculation (min): 19 min Charges:  OT General Charges $OT Visit: 1 Visit OT Evaluation $OT Eval Low Complexity: 1 Low OT Treatments $Self Care/Home Management : 8-22 mins  [place], Student OT   [employer] 02/21/2024, 10:11 AM

## 2024-02-21 NOTE — Progress Notes (Signed)
 Progress Note    02/21/2024 7:08 AM 1 Day Post-Op  Subjective:  Mark Nash is a 68 yo male who is now POD #1 from:  PROCEDURE: 1.   Left common femoral, profunda femoris, and superficial femoral artery endarterectomies 2.   Right common femoral, profunda femoris, and superficial femoral artery endarterectomies 3.   Right lower extremity angiogram 4.   Stent placement to the right popliteal artery with 6 mm diameter by 7.5 cm length Viabahn stent  Patient is resting comfortably in the ICU this morning.  He does endorse some groin pain at the site of incisions as expected.  Patient's left lower extremity is warm to touch remains somewhat mottled in color but has bounding pulses via Doppler.  Patient's right lower extremity remains cold at the foot but has bounding pulses via Doppler.  Patient is noted to be in A-fib/a flutter this morning on the monitor.  He has a history of this since 2022.  Denies any chest pain or shortness of breath this morning.  Vitals all remained stable.   Vitals:   02/21/24 0600 02/21/24 0630  BP: 110/66 (!) 116/58  Pulse: 65 66  Resp: 14 (!) 21  Temp:    SpO2: 98% 99%   Physical Exam: Cardiac: Irregular rate and rhythm due to A-fib/a flutter.  Rate is 90s to 100 this morning.  No rubs clicks gallops or murmurs noted. Lungs: Lungs are clear on auscultation but diminished in the bases.  No rales rhonchi or wheezing noted.  Nonlabored breathing. Incisions: Incision to his right groin Extremities: Bilateral lower extremity with strong Doppler pulses.  Left lower extremity is warm while right lower extremity remains cold to touch at the ankle and foot. Abdomen: Positive bowel sounds throughout, soft, nontender nondistended. Neurologic: Alert and oriented x 3 answers all questions and follows commands appropriately.  CBC    Component Value Date/Time   WBC 8.3 02/21/2024 0010   RBC 4.93 02/21/2024 0010   HGB 14.9 02/21/2024 0010   HGB 16.7 12/24/2023  1559   HCT 45.1 02/21/2024 0010   HCT 50.8 12/24/2023 1559   PLT 136 (L) 02/21/2024 0010   PLT 171 12/24/2023 1559   MCV 91.5 02/21/2024 0010   MCV 91 12/24/2023 1559   MCH 30.2 02/21/2024 0010   MCHC 33.0 02/21/2024 0010   RDW 13.8 02/21/2024 0010   RDW 14.0 12/24/2023 1559   LYMPHSABS 1,981 02/07/2023 1506   LYMPHSABS 1.7 05/25/2021 1216   MONOABS 1.1 (H) 02/01/2023 0744   EOSABS 156 02/07/2023 1506   EOSABS 0.2 05/25/2021 1216   BASOSABS 21 02/07/2023 1506   BASOSABS 0.0 05/25/2021 1216    BMET    Component Value Date/Time   NA 136 02/21/2024 0010   NA 139 12/24/2023 1559   K 4.0 02/21/2024 0010   CL 104 02/21/2024 0010   CO2 24 02/21/2024 0010   GLUCOSE 184 (H) 02/21/2024 0010   BUN 19 02/21/2024 0010   BUN 14 12/24/2023 1559   CREATININE 0.75 02/21/2024 0010   CREATININE 0.95 11/19/2023 1453   CALCIUM 8.5 (L) 02/21/2024 0010   GFRNONAA >60 02/21/2024 0010   GFRNONAA 100 09/15/2020 1500   GFRAA 116 09/15/2020 1500    INR    Component Value Date/Time   INR 1.0 04/20/2021 0920     Intake/Output Summary (Last 24 hours) at 02/21/2024 0708 Last data filed at 02/21/2024 0600 Gross per 24 hour  Intake 3003.88 ml  Output 1840 ml  Net 1163.88 ml  Assessment/Plan:  68 y.o. male is s/p See Above 1 Day Post-Op   PLAN Start Eliquis  5 mg twice daily this morning. Consulted cardiology, Dr. Gollan for patient's A-fib/a flutter. Discontinue art line. Discontinue Foley catheter. OOB to chair today. Every 4 hour pulse checks of bilateral lower extremities. Advance diet as tolerated. Added sliding scale insulin  with coverage with meals today.  DVT prophylaxis: Eliquis  5 mg twice daily.   [facility] 02/21/2024 7:08 AM

## 2024-02-21 NOTE — Progress Notes (Signed)
 PHARMACIST - PHYSICIAN COMMUNICATION  DR:   [place]  CONCERNING: IV to Oral Route Change Policy  RECOMMENDATION: This patient is receiving famotidine  by the intravenous route.  Based on criteria approved by the Pharmacy and Therapeutics Committee, the intravenous medication(s) is/are being converted to the equivalent oral dose form(s).   DESCRIPTION: These criteria include: The patient is eating (either orally or via tube) and/or has been taking other orally administered medications for a least 24 hours The patient has no evidence of active gastrointestinal bleeding or impaired GI absorption (gastrectomy, short bowel, patient on TNA or NPO).  If you have questions about this conversion, please contact the Pharmacy Department  []   [phone] )  [facility] [x]   [phone] )  [facility] []   [phone] )  [facility] []   [phone] )  [facility] []   [phone] )  [facility]   Adriana JONETTA Bolster, [facility] 02/21/2024 9:13 AM

## 2024-02-22 ENCOUNTER — Telehealth: Payer: Self-pay

## 2024-02-22 LAB — GLUCOSE, CAPILLARY
Glucose-Capillary: 74 mg/dL (ref 70–99)
Glucose-Capillary: 199 mg/dL — ABNORMAL HIGH (ref 70–99)
Glucose-Capillary: 142 mg/dL — ABNORMAL HIGH (ref 70–99)
Glucose-Capillary: 157 mg/dL — ABNORMAL HIGH (ref 70–99)

## 2024-02-22 MED ORDER — ENSURE PLUS HIGH PROTEIN PO LIQD
237.0000 mL | Freq: Two times a day (BID) | ORAL | Status: DC
  Administered 2024-02-22 – 2024-02-23 (×4): 237 mL via ORAL

## 2024-02-22 NOTE — Progress Notes (Signed)
 Physical Therapy Treatment Patient Details Name: Mark Nash MRN: [id number] DOB: 1956-03-11 Today's Date: 02/22/2024   History of Present Illness Pt is a 68 y.o male presenting for surgical revascularization of the BLE on 02/20/2024. RLE symptoms have progressed in the last three months with pain at rest. Claudication is present on LLE and now presents with B medial ankle ulcerations. Bilateral femoral endarterectomies performed 02/20/24. PMH: Aortic atherosclerosis, asthma, arthritis, A-fib, COPD, DDD (lumbar), dypsnea, GERD, HFrEF, PVD, pulmonary HTN, T2DM, and schizoaffective disorder.    PT Comments  Pt is supine in bed with HOB elevated, and agreeable to therapy today. PT focused today's session on increasing activity tolerance within pain tolerance. Due to increased reported pain with small bout of  ambulation, PT deferred further mobility and focused on BLE strengthening . During session pt was able to perform bed mobility (supine to sit) with SBA , performed STS with CGA, vc's for hand placement and was able to perform general BLE exercises while seated in recliner. Pt reported 10/10 pain while ambulating which reduced to 8/10 pain at end of session (primarily on LLE and L knee), RN notified and made aware (pt premedicated with pain meds prior to session).    If plan is discharge home, recommend the following: A little help with walking and/or transfers;A little help with bathing/dressing/bathroom;Assistance with cooking/housework;Assist for transportation;Help with stairs or ramp for entrance   Can travel by private vehicle        Equipment Recommendations  Rolling walker (2 wheels);BSC/3in1    Recommendations for Other Services       Precautions / Restrictions Precautions Precautions: Fall Recall of Precautions/Restrictions: Intact Restrictions Weight Bearing Restrictions Per Provider Order: No     Mobility  Bed Mobility Overal bed mobility: Needs Assistance Bed Mobility:  Supine to Sit     Supine to sit: HOB elevated, Supervision     General bed mobility comments: Vc's provided for hand placement and sequencing    Transfers Overall transfer level: Needs assistance Equipment used: Rolling walker (2 wheels) Transfers: Sit to/from Stand Sit to Stand: Contact guard assist           General transfer comment: Vc's for hand placement on bed and sequencing to RW.    Ambulation/Gait Ambulation/Gait assistance: Contact guard assist Gait Distance (Feet): 2 Feet Assistive device: Rolling walker (2 wheels) Gait Pattern/deviations: Step-to pattern, Decreased step length - right, Decreased step length - left, Decreased stance time - right, Decreased stance time - left, Knee flexed in stance - right, Narrow base of support, Knee flexed in stance - left Gait velocity: decreased     General Gait Details: Despite increased pt motivation, PT limited activity due to reports of increased 10/10 pain in L knee with continued ambulation. Prior to PT session, pt was able to ambulate with RW from bedside to bathroom (~30 ft) in the presences of NT, pt reported 6/10 pain with aforementioned activity.   Stairs             Wheelchair Mobility     Tilt Bed    Modified Rankin (Stroke Patients Only)       Balance Overall balance assessment: Needs assistance Sitting-balance support: Feet supported, No upper extremity supported Sitting balance-Leahy Scale: Good Sitting balance - Comments: Steady static seated balance reaching within BOS   Standing balance support: Bilateral upper extremity supported, Reliant on assistive device for balance, During functional activity Standing balance-Leahy Scale: Fair Standing balance comment: Steady static and dynamic standing balance, increased  pain noted with weightbearing on BLE.                            Communication Communication Communication: No apparent difficulties  Cognition Arousal:  Alert Behavior During Therapy: WFL for tasks assessed/performed   PT - Cognitive impairments: Safety/Judgement                       PT - Cognition Comments: Pt is eager and motivated to get better, slightly impulsive, attempting to push through pain.  Following commands: Intact      Cueing Cueing Techniques: Verbal cues, Tactile cues  Exercises General Exercises - Lower Extremity Ankle Circles/Pumps: AROM, Strengthening, Both, 10 reps Long Arc Quad: AROM, Strengthening, Both, 20 reps    General Comments General comments (skin integrity, edema, etc.): Pre session: HR: 107 bpm, SpO2: 95+% on room air, During STS transfer: highest recorded HR: 114-132 bpm, Post session: HR: 106-114 bpm, SpO2: 96+% on room air. Pre session: L groin incision: dry sanguinous saturations throughout bandaging, small wet sanguinous exudate at center of bandage. R groin incision: small, dry sanguinous saturations on top of incision, borders are dry,clean and intact. Post session: L groin incision: no change from start of session. R groin incision: no change from start of session.      Pertinent Vitals/Pain Pain Assessment Pain Assessment: 0-10 Pain Score: 10-Worst pain ever (with movement, does state he want to push through, reduces to a 7-8/10 with rest.) Pain Location: BLE, with emphasis on L knee Pain Descriptors / Indicators: Grimacing, Burning, Guarding Pain Intervention(s): Limited activity within patient's tolerance, Monitored during session, Premedicated before session, Other (comment) (Nurse notified)    Home Living                          Prior Function            PT Goals (current goals can now be found in the care plan section) Acute Rehab PT Goals Patient Stated Goal: To get out of here PT Goal Formulation: With patient Time For Goal Achievement: 03/06/24 Potential to Achieve Goals: Good Additional Goals Additional Goal #1: Pt will be able to tolerate PT session with  only 2 point increase in pain or remain at or below 3/10 on the NPS, in order to increase activity tolerance. Progress towards PT goals: Progressing toward goals    Frequency    Min 2X/week      PT Plan      Co-evaluation              AM-PAC PT 6 Clicks Mobility   Outcome Measure  Help needed turning from your back to your side while in a flat bed without using bedrails?: None Help needed moving from lying on your back to sitting on the side of a flat bed without using bedrails?: None Help needed moving to and from a bed to a chair (including a wheelchair)?: A Little Help needed standing up from a chair using your arms (e.g., wheelchair or bedside chair)?: A Little Help needed to walk in hospital room?: A Little Help needed climbing 3-5 steps with a railing? : A Lot 6 Click Score: 19    End of Session Equipment Utilized During Treatment: Gait belt Activity Tolerance: Patient limited by pain Patient left: with call bell/phone within reach;in chair;with chair alarm set Nurse Communication: Mobility status;Precautions PT Visit Diagnosis: Unsteadiness on feet (  R26.81);Muscle weakness (generalized) (M62.81);Pain Pain - Right/Left:  (Both) Pain - part of body: Knee;Hip;Leg     Time: 8896-8871 PT Time Calculation (min) (ACUTE ONLY): 25 min  Charges:                           Avea Mcgowen, SPT 02/22/24, 11:57 AM

## 2024-02-22 NOTE — Progress Notes (Signed)
 Progress Note    02/22/2024 1:19 PM 2 Days Post-Op  Subjective:  Mark Nash is a 68 yo male who is now POD #1 from:   PROCEDURE: 1.   Left common femoral, profunda femoris, and superficial femoral artery endarterectomies 2.   Right common femoral, profunda femoris, and superficial femoral artery endarterectomies 3.   Right lower extremity angiogram 4.   Stent placement to the right popliteal artery with 6 mm diameter by 7.5 cm length Viabahn stent   Patient is resting comfortably in bed on the floor this morning.  He does endorse some groin pain at the site of incisions as expected.  Patient's left lower extremity is warm to touch remains somewhat mottled in color but has bounding pulses via Doppler.  Patient's right lower extremity remains cold at the foot but has bounding pulses via Doppler.  Patient continues to be in A-fib/a flutter this morning on telemetry  He has a history of this since 2022.  Denies any chest pain or shortness of breath this morning.  Vitals all remained stable.   Vitals:   02/22/24 0935 02/22/24 1146  BP:  111/70  Pulse: 66   Resp:  15  Temp:  98 F (36.7 C)  SpO2:  98%   Physical Exam: Cardiac: Irregular rate and rhythm due to A-fib/a flutter.  Rate is 90s to 100 this morning.  No rubs clicks gallops or murmurs noted. Lungs: Lungs are clear on auscultation but diminished in the bases.  No rales rhonchi or wheezing noted.  Nonlabored breathing. Incisions: Incision to his right groin Extremities: Bilateral lower extremity with strong Doppler pulses.  Left lower extremity is warm while right lower extremity remains cold to touch at the ankle and foot. Abdomen: Positive bowel sounds throughout, soft, nontender nondistended. Neurologic: Alert and oriented x 3 answers all questions and follows commands appropriately.  CBC    Component Value Date/Time   WBC 8.3 02/21/2024 0010   RBC 4.93 02/21/2024 0010   HGB 14.9 02/21/2024 0010   HGB 16.7 12/24/2023  1559   HCT 45.1 02/21/2024 0010   HCT 50.8 12/24/2023 1559   PLT 136 (L) 02/21/2024 0010   PLT 171 12/24/2023 1559   MCV 91.5 02/21/2024 0010   MCV 91 12/24/2023 1559   MCH 30.2 02/21/2024 0010   MCHC 33.0 02/21/2024 0010   RDW 13.8 02/21/2024 0010   RDW 14.0 12/24/2023 1559   LYMPHSABS 1,981 02/07/2023 1506   LYMPHSABS 1.7 05/25/2021 1216   MONOABS 1.1 (H) 02/01/2023 0744   EOSABS 156 02/07/2023 1506   EOSABS 0.2 05/25/2021 1216   BASOSABS 21 02/07/2023 1506   BASOSABS 0.0 05/25/2021 1216    BMET    Component Value Date/Time   NA 136 02/21/2024 0010   NA 139 12/24/2023 1559   K 4.0 02/21/2024 0010   CL 104 02/21/2024 0010   CO2 24 02/21/2024 0010   GLUCOSE 184 (H) 02/21/2024 0010   BUN 19 02/21/2024 0010   BUN 14 12/24/2023 1559   CREATININE 0.75 02/21/2024 0010   CREATININE 0.95 11/19/2023 1453   CALCIUM 8.5 (L) 02/21/2024 0010   GFRNONAA >60 02/21/2024 0010   GFRNONAA 100 09/15/2020 1500   GFRAA 116 09/15/2020 1500    INR    Component Value Date/Time   INR 1.0 04/20/2021 0920     Intake/Output Summary (Last 24 hours) at 02/22/2024 1319 Last data filed at 02/22/2024 1006 Gross per 24 hour  Intake 1002.78 ml  Output 1950 ml  Net -  947.22 ml     Assessment/Plan:  68 y.o. male is s/p See Above 2 Days Post-Op   PLAN Continue Eliquis  5 mg BID  Continue Rate Control medications.  Continue to work with PT/OT  Q8 hour pulse checks.  Carb Control Diet   DVT prophylaxis:  Eliquis  5 mg BID    Mark Nash [facility] 02/22/2024 1:19 PM

## 2024-02-22 NOTE — Plan of Care (Signed)
  Problem: Education: Goal: Knowledge of General Education information will improve Description: Including pain rating scale, medication(s)/side effects and non-pharmacologic comfort measures Outcome: Progressing   Problem: Health Behavior/Discharge Planning: Goal: Ability to manage health-related needs will improve Outcome: Progressing   Problem: Clinical Measurements: Goal: Ability to maintain clinical measurements within normal limits will improve Outcome: Progressing Goal: Will remain free from infection Outcome: Progressing Goal: Diagnostic test results will improve Outcome: Progressing Goal: Respiratory complications will improve Outcome: Progressing Goal: Cardiovascular complication will be avoided Outcome: Progressing   Problem: Activity: Goal: Risk for activity intolerance will decrease Outcome: Progressing   Problem: Nutrition: Goal: Adequate nutrition will be maintained Outcome: Progressing   Problem: Coping: Goal: Level of anxiety will decrease Outcome: Progressing   Problem: Elimination: Goal: Will not experience complications related to bowel motility Outcome: Progressing Goal: Will not experience complications related to urinary retention Outcome: Progressing   Problem: Pain Managment: Goal: General experience of comfort will improve and/or be controlled Outcome: Progressing   Problem: Safety: Goal: Ability to remain free from injury will improve Outcome: Progressing   Problem: Skin Integrity: Goal: Risk for impaired skin integrity will decrease Outcome: Progressing   Problem: Education: Goal: Knowledge of prescribed regimen will improve Outcome: Progressing   Problem: Activity: Goal: Ability to tolerate increased activity will improve Outcome: Progressing   Problem: Bowel/Gastric: Goal: Gastrointestinal status for postoperative course will improve Outcome: Progressing   Problem: Clinical Measurements: Goal: Postoperative complications  will be avoided or minimized Outcome: Progressing Goal: Signs and symptoms of graft occlusion will improve Outcome: Progressing   Problem: Skin Integrity: Goal: Demonstration of wound healing without infection will improve Outcome: Progressing   Problem: Education: Goal: Ability to describe self-care measures that may prevent or decrease complications (Diabetes Survival Skills Education) will improve Outcome: Progressing Goal: Individualized Educational Video(s) Outcome: Progressing   Problem: Coping: Goal: Ability to adjust to condition or change in health will improve Outcome: Progressing   Problem: Fluid Volume: Goal: Ability to maintain a balanced intake and output will improve Outcome: Progressing   Problem: Health Behavior/Discharge Planning: Goal: Ability to identify and utilize available resources and services will improve Outcome: Progressing Goal: Ability to manage health-related needs will improve Outcome: Progressing   Problem: Metabolic: Goal: Ability to maintain appropriate glucose levels will improve Outcome: Progressing   Problem: Nutritional: Goal: Maintenance of adequate nutrition will improve Outcome: Progressing Goal: Progress toward achieving an optimal weight will improve Outcome: Progressing   Problem: Skin Integrity: Goal: Risk for impaired skin integrity will decrease Outcome: Progressing   Problem: Tissue Perfusion: Goal: Adequacy of tissue perfusion will improve Outcome: Progressing

## 2024-02-22 NOTE — Progress Notes (Signed)
 Gwendlyn Shank NP and primary RN made aware that per CCMD pt currently running afib HR 100 but with new frequent PVCs- trigeminy, acknowledged- continue meds as ordered

## 2024-02-22 NOTE — Progress Notes (Signed)
 Occupational Therapy Treatment Patient Details Name: Mark Nash MRN: [id number] DOB: August 10, 1956 Today's Date: 02/22/2024   History of present illness Pt is a 68 y.o male presenting for surgical revascularization of the BLE on 02/20/2024. RLE symptoms have progressed in the last three months with pain at rest. Claudication is present on LLE and now presents with B medial ankle ulcerations. Bilateral femoral endarterectomies performed 02/20/24. PMH: Aortic atherosclerosis, asthma, arthritis, A-fib, COPD, DDD (lumbar), dypsnea, GERD, HFrEF, PVD, pulmonary HTN, T2DM, and schizoaffective disorder.   OT comments  Chart reviewed to date, pt greeted in bed, agreeable to OT tx session targeting improving functional activity tolerance in prep for ADL tasks. Pt continues to make progress towards goals as evidenced by performing bed mobility with supervision, STS with supervision-CGA, amb to bathroom with RW with supervision-CGA, toilet transfer with supervision-CGA with RW, standing grooming tasks with supervision-CGA. Frequent vcs for RW technique/placement. Pt them amb approx 100' with RW. Pt is left in bed, all needs med. Educated pt on safe ADL completion with DME/AE as he reports he lives alone. Pt will continue to benefit from acute OT to address functional deficits and to facilitate optimal ADL/functional mobility performance.       If plan is discharge home, recommend the following:  A little help with walking and/or transfers;A little help with bathing/dressing/bathroom;Assistance with cooking/housework;Assist for transportation;Help with stairs or ramp for entrance   Equipment Recommendations  BSC/3in1;Other (comment) (2WW)    Recommendations for Other Services      Precautions / Restrictions Precautions Precautions: Fall Recall of Precautions/Restrictions: Intact Restrictions Weight Bearing Restrictions Per Provider Order: No       Mobility Bed Mobility Overal bed mobility: Needs  Assistance Bed Mobility: Supine to Sit, Sit to Supine     Supine to sit: Supervision, HOB elevated, Used rails Sit to supine: Supervision        Transfers Overall transfer level: Needs assistance Equipment used: Rolling walker (2 wheels) Transfers: Sit to/from Stand Sit to Stand: Contact guard assist                 Balance Overall balance assessment: Needs assistance Sitting-balance support: Feet supported, No upper extremity supported Sitting balance-Leahy Scale: Good     Standing balance support: Bilateral upper extremity supported, Reliant on assistive device for balance, During functional activity Standing balance-Leahy Scale: Fair                             ADL either performed or assessed with clinical judgement   ADL Overall ADL's : Needs assistance/impaired     Grooming: Wash/dry hands;Standing;Supervision/safety;Contact guard assist Grooming Details (indicate cue type and reason): at sink level, frequent vcs for RW technique/placement             Lower Body Dressing: Maximal assistance   Toilet Transfer: Supervision/safety;Contact guard assist;Rolling walker (2 wheels);Regular Toilet;Ambulation;Cueing for sequencing;Grab bars   Toileting- Clothing Manipulation and Hygiene: Supervision/safety;Sitting/lateral lean       Functional mobility during ADLs: Supervision/safety;Contact guard assist;Rolling walker (2 wheels) (approx 100' with RW, intermittent vcs for RW technique)      Extremity/Trunk Assessment              Vision       Perception     Praxis     Communication Communication Communication: No apparent difficulties   Cognition Arousal: Alert Behavior During Therapy: WFL for tasks assessed/performed Cognition: No apparent impairments  Following commands: Intact        Cueing   Cueing Techniques: Verbal cues, Tactile cues  Exercises Other Exercises Other Exercises:  edu re: role of OT, role of rehab, use of DME/AE for safe ADL completion    Shoulder Instructions       General Comments vss throughout; incisions appear grossly the same as documented in PT note from treatment session earlier    Pertinent Vitals/ Pain       Pain Assessment Pain Assessment: 0-10 Pain Score: 2  Pain Location: LLE Pain Descriptors / Indicators: Grimacing, Burning, Guarding Pain Intervention(s): Monitored during session, Repositioned  Home Living                                          Prior Functioning/Environment              Frequency  Min 2X/week        Progress Toward Goals  OT Goals(current goals can now be found in the care plan section)  Progress towards OT goals: Progressing toward goals  Acute Rehab OT Goals Time For Goal Achievement: 03/06/24  Plan      Co-evaluation                 AM-PAC OT 6 Clicks Daily Activity     Outcome Measure   Help from another person eating meals?: None Help from another person taking care of personal grooming?: None Help from another person toileting, which includes using toliet, bedpan, or urinal?: A Little Help from another person bathing (including washing, rinsing, drying)?: A Lot Help from another person to put on and taking off regular upper body clothing?: A Little Help from another person to put on and taking off regular lower body clothing?: A Lot 6 Click Score: 18    End of Session Equipment Utilized During Treatment: Gait belt;Rolling walker (2 wheels)  OT Visit Diagnosis: Unsteadiness on feet (R26.81);Other abnormalities of gait and mobility (R26.89);Muscle weakness (generalized) (M62.81);Pain Pain - Right/Left: Left Pain - part of body: Knee;Leg   Activity Tolerance Patient tolerated treatment well   Patient Left with call bell/phone within reach;in bed;with bed alarm set   Nurse Communication          Time: 8657-8640 OT Time Calculation (min): 17  min  Charges: OT General Charges $OT Visit: 1 Visit OT Treatments $Therapeutic Activity: 8-22 mins  Therisa Sheffield, OTD OTR/L  02/22/24, 3:25 PM

## 2024-02-23 LAB — GLUCOSE, CAPILLARY
Glucose-Capillary: 113 mg/dL — ABNORMAL HIGH (ref 70–99)
Glucose-Capillary: 191 mg/dL — ABNORMAL HIGH (ref 70–99)
Glucose-Capillary: 129 mg/dL — ABNORMAL HIGH (ref 70–99)
Glucose-Capillary: 119 mg/dL — ABNORMAL HIGH (ref 70–99)

## 2024-02-23 MED ORDER — POLYETHYLENE GLYCOL 3350 17 G PO PACK
17.0000 g | PACK | Freq: Every day | ORAL | Status: DC
  Administered 2024-02-23 – 2024-02-24 (×2): 17 g via ORAL
  Filled 2024-02-23 (×2): qty 1

## 2024-02-23 NOTE — Progress Notes (Addendum)
 Physical Therapy Treatment Patient Details Name: Mark Nash MRN: [id number] DOB: 04-Sep-1955 Today's Date: 02/23/2024   History of Present Illness Pt is a 68 y.o male presenting for surgical revascularization of the BLE on 02/20/2024. RLE symptoms have progressed in the last three months with pain at rest. Claudication is present on LLE and now presents with B medial ankle ulcerations. Bilateral femoral endarterectomies performed 02/20/24. PMH: Aortic atherosclerosis, asthma, arthritis, A-fib, COPD, DDD (lumbar), dypsnea, GERD, HFrEF, PVD, pulmonary HTN, T2DM, and schizoaffective disorder.    PT Comments  Pt tolerated treatment well today despite 8/10 pain in LLE. Treatment focused on improved safety awareness, insight into deficits, and activity tolerance. Today, he demonstrates improved standing balance, assist levels, and insight into deficits. He is supervision for bed mobility, supervision for transfers, and CGA-SBA to ambulate 14ft in RW. Demonstrates improved quality of gait, cadence, and safety awareness in RW. HR elevated to 109bpm max after ambulation; RPE of 2-3/10 indicates light activity.  Pt encouraged to ambulate with nursing in RW for continued progress towards goals and return to baseline. Pt will continue to benefit from skilled acute PT services to address deficits for return to baseline function.    If plan is discharge home, recommend the following: A little help with walking and/or transfers;A little help with bathing/dressing/bathroom;Assistance with cooking/housework;Assist for transportation;Help with stairs or ramp for entrance   Can travel by private vehicle        Equipment Recommendations  Rolling walker (2 wheels);BSC/3in1    Recommendations for Other Services       Precautions / Restrictions Precautions Precautions: Fall Recall of Precautions/Restrictions: Intact Restrictions Weight Bearing Restrictions Per Provider Order: No     Mobility  Bed  Mobility Overal bed mobility: Needs Assistance Bed Mobility: Supine to Sit, Sit to Supine     Supine to sit: Supervision, HOB elevated, Used rails Sit to supine: Supervision   General bed mobility comments: Vc's provided for hand placement and sequencing    Transfers Overall transfer level: Needs assistance Equipment used: Rolling walker (2 wheels) Transfers: Sit to/from Stand Sit to Stand: Supervision           General transfer comment: Vc's for hand placement and sequencing to RW.    Ambulation/Gait Ambulation/Gait assistance: Contact guard assist, Supervision Gait Distance (Feet): 90 Feet Assistive device: Rolling walker (2 wheels)         General Gait Details: Initially CGA for safety progressing to close supervision for safety to ambulate in hallway with RW. Despite 8/10 LLE pain pt ambulates with early reciprocal gait pattern, good cadence, and good safety awareness with RW. Declining further ambulation secondary to increased LLE pain and onset of nausea.     Balance Overall balance assessment: Needs assistance Sitting-balance support: Feet supported, No upper extremity supported Sitting balance-Leahy Scale: Good     Standing balance support: Bilateral upper extremity supported, Reliant on assistive device for balance, During functional activity Standing balance-Leahy Scale: Good Standing balance comment: initially fair standing balance progressing to good with BUE support on RW                            Communication Communication Communication: No apparent difficulties  Cognition Arousal: Alert Behavior During Therapy: WFL for tasks assessed/performed                           PT - Cognition Comments: receptive to activity pacing  and pain tolerance education Following commands: Intact      Cueing Cueing Techniques: Verbal cues, Tactile cues     General Comments General comments (skin integrity, edema, etc.): Pt receptive to  education regarding activity pacing/tolerance and pain management with functional mobility      Pertinent Vitals/Pain Pain Assessment Pain Assessment: 0-10 Pain Score: 7  Pain Location: LLE Pain Descriptors / Indicators: Grimacing, Burning, Guarding Pain Intervention(s): Monitored during session, Repositioned     PT Goals (current goals can now be found in the care plan section) Acute Rehab PT Goals Patient Stated Goal: To get out of here PT Goal Formulation: With patient Time For Goal Achievement: 03/06/24 Potential to Achieve Goals: Good Additional Goals Additional Goal #1: Pt will be able to tolerate PT session with only 2 point increase in pain or remain at or below 3/10 on the NPS, in order to increase activity tolerance. Progress towards PT goals: Progressing toward goals    Frequency    Min 2X/week       AM-PAC PT 6 Clicks Mobility   Outcome Measure  Help needed turning from your back to your side while in a flat bed without using bedrails?: None Help needed moving from lying on your back to sitting on the side of a flat bed without using bedrails?: None Help needed moving to and from a bed to a chair (including a wheelchair)?: A Little Help needed standing up from a chair using your arms (e.g., wheelchair or bedside chair)?: A Little Help needed to walk in hospital room?: A Little Help needed climbing 3-5 steps with a railing? : A Lot 6 Click Score: 19    End of Session Equipment Utilized During Treatment: Gait belt Activity Tolerance: Patient limited by pain Patient left: with call bell/phone within reach;in chair;with chair alarm set Nurse Communication: Mobility status;Precautions PT Visit Diagnosis: Unsteadiness on feet (R26.81);Muscle weakness (generalized) (M62.81);Pain Pain - Right/Left: Left Pain - part of body: Knee;Hip;Leg     Time: 1002-1014 PT Time Calculation (min) (ACUTE ONLY): 12 min  Charges:    $Therapeutic Exercise: 8-22 mins PT  General Charges $$ ACUTE PT VISIT: 1 Visit                       Camie CHARLENA Kluver, PT, DPT 12:52 PM,02/23/24 Physical Therapist -  [facility]

## 2024-02-23 NOTE — Plan of Care (Signed)
  Problem: Education: Goal: Knowledge of General Education information will improve Description: Including pain rating scale, medication(s)/side effects and non-pharmacologic comfort measures Outcome: Progressing   Problem: Health Behavior/Discharge Planning: Goal: Ability to manage health-related needs will improve Outcome: Progressing   Problem: Clinical Measurements: Goal: Ability to maintain clinical measurements within normal limits will improve Outcome: Progressing Goal: Will remain free from infection Outcome: Progressing Goal: Diagnostic test results will improve Outcome: Progressing Goal: Respiratory complications will improve Outcome: Progressing Goal: Cardiovascular complication will be avoided Outcome: Progressing   Problem: Activity: Goal: Risk for activity intolerance will decrease Outcome: Progressing   Problem: Nutrition: Goal: Adequate nutrition will be maintained Outcome: Progressing   Problem: Coping: Goal: Level of anxiety will decrease Outcome: Progressing   Problem: Elimination: Goal: Will not experience complications related to bowel motility Outcome: Progressing Goal: Will not experience complications related to urinary retention Outcome: Progressing   Problem: Pain Managment: Goal: General experience of comfort will improve and/or be controlled Outcome: Progressing   Problem: Safety: Goal: Ability to remain free from injury will improve Outcome: Progressing   Problem: Skin Integrity: Goal: Risk for impaired skin integrity will decrease Outcome: Progressing   Problem: Education: Goal: Knowledge of prescribed regimen will improve Outcome: Progressing   Problem: Activity: Goal: Ability to tolerate increased activity will improve Outcome: Progressing   Problem: Bowel/Gastric: Goal: Gastrointestinal status for postoperative course will improve Outcome: Progressing   Problem: Clinical Measurements: Goal: Postoperative complications  will be avoided or minimized Outcome: Progressing Goal: Signs and symptoms of graft occlusion will improve Outcome: Progressing   Problem: Skin Integrity: Goal: Demonstration of wound healing without infection will improve Outcome: Progressing   Problem: Education: Goal: Ability to describe self-care measures that may prevent or decrease complications (Diabetes Survival Skills Education) will improve Outcome: Progressing Goal: Individualized Educational Video(s) Outcome: Progressing   Problem: Coping: Goal: Ability to adjust to condition or change in health will improve Outcome: Progressing   Problem: Fluid Volume: Goal: Ability to maintain a balanced intake and output will improve Outcome: Progressing   Problem: Health Behavior/Discharge Planning: Goal: Ability to identify and utilize available resources and services will improve Outcome: Progressing Goal: Ability to manage health-related needs will improve Outcome: Progressing   Problem: Metabolic: Goal: Ability to maintain appropriate glucose levels will improve Outcome: Progressing   Problem: Nutritional: Goal: Maintenance of adequate nutrition will improve Outcome: Progressing Goal: Progress toward achieving an optimal weight will improve Outcome: Progressing   Problem: Skin Integrity: Goal: Risk for impaired skin integrity will decrease Outcome: Progressing   Problem: Tissue Perfusion: Goal: Adequacy of tissue perfusion will improve Outcome: Progressing

## 2024-02-23 NOTE — Care Management Important Message (Signed)
 Important Message  Patient Details  Name: Mark Nash MRN: [id number] Date of Birth: October 06, 1955   Important Message Given:  Yes - Medicare IM     Tauriel Scronce W, CMA 02/23/2024, 11:40 AM

## 2024-02-23 NOTE — Progress Notes (Signed)
    Subjective  - POD #3  Has been ambulating Wants another day before going home   Physical Exam:  Groins are soft without hematoma Palpable pedal pulses       Assessment/Plan:  POD #3  Plan for discharge tomorrow morning  Mark Nash 02/23/2024 1:12 PM --  Vitals:   02/23/24 0839 02/23/24 1059  BP:  131/86  Pulse: 69 99  Resp:  16  Temp:  98.1 F (36.7 C)  SpO2: 99% 99%    Intake/Output Summary (Last 24 hours) at 02/23/2024 1312 Last data filed at 02/23/2024 1035 Gross per 24 hour  Intake 460 ml  Output 2375 ml  Net -1915 ml     Laboratory CBC    Component Value Date/Time   WBC 8.3 02/21/2024 0010   HGB 14.9 02/21/2024 0010   HGB 16.7 12/24/2023 1559   HCT 45.1 02/21/2024 0010   HCT 50.8 12/24/2023 1559   PLT 136 (L) 02/21/2024 0010   PLT 171 12/24/2023 1559    BMET    Component Value Date/Time   NA 136 02/21/2024 0010   NA 139 12/24/2023 1559   K 4.0 02/21/2024 0010   CL 104 02/21/2024 0010   CO2 24 02/21/2024 0010   GLUCOSE 184 (H) 02/21/2024 0010   BUN 19 02/21/2024 0010   BUN 14 12/24/2023 1559   CREATININE 0.75 02/21/2024 0010   CREATININE 0.95 11/19/2023 1453   CALCIUM 8.5 (L) 02/21/2024 0010   GFRNONAA >60 02/21/2024 0010   GFRNONAA 100 09/15/2020 1500   GFRAA 116 09/15/2020 1500    COAG Lab Results  Component Value Date   INR 1.0 04/20/2021   No results found for: PTT  Antibiotics Anti-infectives (From admission, onward)    Start     Dose/Rate Route Frequency Ordered Stop   02/20/24 2000  ceFAZolin  (ANCEF ) IVPB 2g/100 mL premix        2 g 200 mL/hr over 30 Minutes Intravenous Every 8 hours 02/20/24 1409 02/21/24 0446   02/20/24 0926  gentamicin  (GARAMYCIN ) injection  Status:  Discontinued          As needed 02/20/24 0926 02/20/24 1222   02/20/24 0926  vancomycin  (VANCOCIN ) powder  Status:  Discontinued          As needed 02/20/24 0926 02/20/24 1222   02/20/24 0630  ceFAZolin  (ANCEF ) IVPB 2g/100 mL premix         2 g 200 mL/hr over 30 Minutes Intravenous  Once 02/20/24 0622 02/20/24 1220        V. Malvina Serene CLORE, M.D., [facility] [place] Office: [phone] Pager:  [phone]

## 2024-02-24 LAB — GLUCOSE, CAPILLARY: Glucose-Capillary: 119 mg/dL — ABNORMAL HIGH (ref 70–99)

## 2024-02-24 MED ORDER — OXYCODONE HCL 5 MG PO TABS: 5.0000 mg | ORAL_TABLET | Freq: Three times a day (TID) | ORAL | 0 refills | Status: DC | PRN

## 2024-02-24 NOTE — Progress Notes (Signed)
 Plan d/c today Groin dressings removed Palpable pedal pulses   Mark Nash

## 2024-02-24 NOTE — Plan of Care (Signed)
  Problem: Education: Goal: Knowledge of General Education information will improve Description: Including pain rating scale, medication(s)/side effects and non-pharmacologic comfort measures Outcome: Progressing   Problem: Health Behavior/Discharge Planning: Goal: Ability to manage health-related needs will improve Outcome: Progressing   Problem: Clinical Measurements: Goal: Ability to maintain clinical measurements within normal limits will improve Outcome: Progressing Goal: Will remain free from infection Outcome: Progressing Goal: Diagnostic test results will improve Outcome: Progressing Goal: Respiratory complications will improve Outcome: Progressing Goal: Cardiovascular complication will be avoided Outcome: Progressing   Problem: Activity: Goal: Risk for activity intolerance will decrease Outcome: Progressing   Problem: Nutrition: Goal: Adequate nutrition will be maintained Outcome: Progressing   Problem: Coping: Goal: Level of anxiety will decrease Outcome: Progressing   Problem: Elimination: Goal: Will not experience complications related to bowel motility Outcome: Progressing Goal: Will not experience complications related to urinary retention Outcome: Progressing   Problem: Pain Managment: Goal: General experience of comfort will improve and/or be controlled Outcome: Progressing   Problem: Safety: Goal: Ability to remain free from injury will improve Outcome: Progressing   Problem: Skin Integrity: Goal: Risk for impaired skin integrity will decrease Outcome: Progressing   Problem: Education: Goal: Knowledge of prescribed regimen will improve Outcome: Progressing   Problem: Activity: Goal: Ability to tolerate increased activity will improve Outcome: Progressing   Problem: Bowel/Gastric: Goal: Gastrointestinal status for postoperative course will improve Outcome: Progressing   Problem: Clinical Measurements: Goal: Postoperative complications  will be avoided or minimized Outcome: Progressing Goal: Signs and symptoms of graft occlusion will improve Outcome: Progressing   Problem: Skin Integrity: Goal: Demonstration of wound healing without infection will improve Outcome: Progressing   Problem: Education: Goal: Ability to describe self-care measures that may prevent or decrease complications (Diabetes Survival Skills Education) will improve Outcome: Progressing Goal: Individualized Educational Video(s) Outcome: Progressing   Problem: Coping: Goal: Ability to adjust to condition or change in health will improve Outcome: Progressing   Problem: Fluid Volume: Goal: Ability to maintain a balanced intake and output will improve Outcome: Progressing   Problem: Health Behavior/Discharge Planning: Goal: Ability to identify and utilize available resources and services will improve Outcome: Progressing Goal: Ability to manage health-related needs will improve Outcome: Progressing   Problem: Metabolic: Goal: Ability to maintain appropriate glucose levels will improve Outcome: Progressing   Problem: Nutritional: Goal: Maintenance of adequate nutrition will improve Outcome: Progressing Goal: Progress toward achieving an optimal weight will improve Outcome: Progressing   Problem: Skin Integrity: Goal: Risk for impaired skin integrity will decrease Outcome: Progressing   Problem: Tissue Perfusion: Goal: Adequacy of tissue perfusion will improve Outcome: Progressing

## 2024-02-24 NOTE — TOC Transition Note (Signed)
 Transition of Care [facility]) - Discharge Note   Patient Details  Name: Mark Nash MRN: [id number] Date of Birth: 1956/08/26  [facility] CM/SW Contact:  Marinda Cooks, RN Phone Number: 02/24/2024, 4:34 PM   Clinical Narrative:    This CM updated by covering MD pt medically cleared to dc today and has active DC order . This CM arranged HH / DME per PT recommendations with [employer].   DC transportation confirmed for pt with family,Medical team updated . No additional DC needs requested by medical team or identified by CM at this time .     Final next level of care: Home w Home Health Services Barriers to Discharge: No Barriers Identified   Name of family member notified: Patient Patient and family notified of of transfer: 02/24/24  Discharge Plan and Services Additional resources added to the After Visit Summary for                  DME Arranged: 3-N-1, Walker rolling   Date DME Agency Contacted: 02/24/24 Time DME Agency Contacted: 1625 Representative spoke with at DME Agency: [facility] HH Arranged: PT, OT HH Agency: [facility] Date [facility] Agency Contacted: 02/24/24   Representative spoke with at [facility] Agency: [facility]  Social Drivers of Health (SDOH) Interventions SDOH Screenings   Food Insecurity: No Food Insecurity (02/20/2024)  Housing: Low Risk  (02/20/2024)  Transportation Needs: No Transportation Needs (02/20/2024)  Utilities: Not At Risk (02/20/2024)  Alcohol Screen: Low Risk  (10/04/2023)  Depression (PHQ2-9): Low Risk  (01/01/2024)  Financial Resource Strain: Low Risk  (10/08/2023)   Received from [facility]  Physical Activity: Insufficiently Active (10/04/2023)  Social Connections: Socially Isolated (02/20/2024)  Stress: No Stress Concern Present (10/04/2023)  Tobacco Use: High Risk (02/20/2024)  Health Literacy: Adequate Health Literacy (10/04/2023)     Readmission Risk Interventions     No data to display

## 2024-02-24 NOTE — Progress Notes (Signed)
 Discharge papers given and education done. Instructed to call vascular tomorrow to make follow-up appt in 2 weeks. Told he need to wait for case management to get  his walker. Patient is in a hurry to leave states his niece have a baby and she need to get back home. Case management called in to the room and spoke to them. Patient called out that they need a wheelchair to go. I message Case management. Went to the room to see patient but they were gone. Case management informed of this. Provider was attached to message for other patient needs orders.

## 2024-02-24 NOTE — Discharge Summary (Signed)
 Physician Discharge Summary  Patient ID: Mark Nash MRN: [id number] DOB/AGE: 1955-10-31 68 y.o.  Admit date: 02/20/2024 Discharge date: 02/24/2024  Admission Diagnoses:  Discharge Diagnoses:  Principal Problem:   Atherosclerosis of artery of extremity with ulceration (HCC) Active Problems:   Coronary artery disease of native artery of native heart with stable angina pectoris (HCC)   Typical atrial flutter (HCC)   Dilated cardiomyopathy [facility])   Discharged Condition: good  Hospital Course: This is a 68 year old gentleman who is status post bilateral femoral endarterectomy and right popliteal stent on 02/20/2024 for rest pain with ulcerations.  He had a relatively uncomplicated hospital course was able to ambulate.  His pain was well-controlled.  He is ready for discharge   Discharge Exam: Blood pressure (!) 150/95, pulse 98, temperature 97.6 F (36.4 C), resp. rate 18, height 5' 11 (1.803 m), weight 62.2 kg, SpO2 100%. Palpable bilateral pedal pulses. Both groin incisions have healed nicely  Disposition:    Allergies as of 02/24/2024       Reactions   Penicillins Hives, Swelling   Childhood allergy.  Has tolerated 3rd generation cephalosporin (CEFTRIAXONE ) without documented ADRs   Aspirin  Nausea Only   Ibuprofen Nausea Only   Lyrica  [pregabalin ] Nausea Only   Acetaminophen  Nausea Only        Medication List     TAKE these medications    blood glucose meter kit and supplies Kit Dispense based on patient and insurance preference. Use up to tid E11.65, LON:99   Breztri  Aerosphere 160-9-4.8 MCG/ACT Aero inhaler Generic drug: budesonide -glycopyrrolate -formoterol  Inhale 2 puffs into the lungs 2 (two) times daily. Patient receives via [employer] Patient Assistance.   dapagliflozin  propanediol 10 MG Tabs tablet Commonly known as: Farxiga  Take 1 tablet (10 mg total) by mouth daily.   digoxin  0.125 MG tablet Commonly known as: LANOXIN  Take 0.5 tablets (0.0625 mg  total) by mouth daily.   Eliquis  5 MG Tabs tablet Generic drug: apixaban  Take 1 tablet by mouth twice daily   gabapentin  300 MG capsule Commonly known as: NEURONTIN  Take 300-600 mg by mouth See admin instructions. Take 1 capsule (300 mg) by mouth in the morning, take 1 capsule (300 mg) by mouth at noon, and 2 capsules (600 mg) by mouth in the evening   Insulin  Degludec 100 UNIT/ML Soln Commonly known as: Tresiba  Inject 8 Units into the skin daily at 12 noon.   Lancet Devices Misc 1 each by Does not apply route 3 (three) times daily as needed. What ins will cover dx:E11.65, LON:99 tid   losartan  25 MG tablet Commonly known as: COZAAR  TAKE 1/2 (ONE-HALF) TABLET BY MOUTH ONCE DAILY SKIP  THE  DOSE  IF  SYSTOLIC  BP  LESS  THAN  110  MMHG What changed: See the new instructions.   metoprolol  200 MG 24 hr tablet Commonly known as: TOPROL -XL Take 1 tablet (200 mg total) by mouth daily. Take with or immediately following a meal.   omeprazole  20 MG capsule Commonly known as: PRILOSEC Take 20 mg by mouth 3 (three) times daily.   OneTouch Delica Plus Lancet30G Misc Use to check blood sugar up to four times daily as directed   OneTouch Verio Flex System w/Device Kit Use to check blood sugar up to four times daily as directed   OneTouch Verio test strip Generic drug: glucose blood Use to check blood sugar up to four times daily as directed   oxyCODONE  5 MG immediate release tablet Commonly known as: Roxicodone  Take 1  tablet (5 mg total) by mouth every 8 (eight) hours as needed.   Ozempic  (0.25 or 0.5 MG/DOSE) 2 MG/1.5ML Sopn Generic drug: Semaglutide (0.25 or 0.5MG /DOS) Inject 0.25 mg into the skin once a week. What changed: how much to take   pravastatin  20 MG tablet Commonly known as: PRAVACHOL  Take 1 tablet (20 mg total) by mouth daily with supper.   tiZANidine  4 MG tablet Commonly known as: ZANAFLEX  Take 4 mg by mouth every 12 (twelve) hours.   traMADol  50 MG  tablet Commonly known as: ULTRAM  Take 50 mg by mouth every 12 (twelve) hours.   triamcinolone  cream 0.1 % Commonly known as: KENALOG  Apply topically 2 (two) times daily.   Vitamin D  (Ergocalciferol ) 1.25 MG (50000 UNIT) Caps capsule Commonly known as: DRISDOL  TAKE ONE CAPSULE BY MOUTH EVERY 14 DAYS        Follow-up Information     Sowles, Krichna, MD Follow up.   Specialty: Family Medicine Why: Hospital follow up Contact information: [address] [place] [address] [phone]                 Signed: Wells Dakiya Puopolo 02/24/2024, 10:19 AM

## 2024-02-26 ENCOUNTER — Other Ambulatory Visit (HOSPITAL_COMMUNITY): Payer: Self-pay

## 2024-03-05 ENCOUNTER — Other Ambulatory Visit (INDEPENDENT_AMBULATORY_CARE_PROVIDER_SITE_OTHER): Payer: Self-pay | Admitting: Nurse Practitioner

## 2024-03-05 ENCOUNTER — Telehealth (INDEPENDENT_AMBULATORY_CARE_PROVIDER_SITE_OTHER): Payer: Self-pay

## 2024-03-05 MED ORDER — OXYCODONE HCL 5 MG PO TABS
5.0000 mg | ORAL_TABLET | Freq: Four times a day (QID) | ORAL | 0 refills | Status: DC | PRN
Start: 2024-03-05 — End: 2024-05-26

## 2024-03-05 NOTE — Telephone Encounter (Signed)
 Patient notified with medical recommendations and verbalized understanding

## 2024-03-05 NOTE — Telephone Encounter (Signed)
 Refill sent.

## 2024-03-05 NOTE — Telephone Encounter (Signed)
 The pain is likely because he has done a bit to much activity after surgery.  He was likely doing far too much at 3-4 days out from surgery.  Does he have pain medication? I would advise he limit his activity and rest

## 2024-03-05 NOTE — Telephone Encounter (Signed)
Patient is out of pain medication

## 2024-03-05 NOTE — Telephone Encounter (Signed)
 Patient left a message stating he experiencing pain with left leg. The patient pain level runs between a 8 to 10. Patient started experiencing pain after he did some cleaning about 3 or 4 days after surgery.Patient had femoral endarterectomy 02/20/24. Please Advise

## 2024-03-07 ENCOUNTER — Other Ambulatory Visit (INDEPENDENT_AMBULATORY_CARE_PROVIDER_SITE_OTHER): Payer: Self-pay | Admitting: Vascular Surgery

## 2024-03-07 DIAGNOSIS — Z9889 Other specified postprocedural states: Secondary | ICD-10-CM

## 2024-03-10 ENCOUNTER — Ambulatory Visit (INDEPENDENT_AMBULATORY_CARE_PROVIDER_SITE_OTHER)

## 2024-03-10 DIAGNOSIS — I739 Peripheral vascular disease, unspecified: Secondary | ICD-10-CM | POA: Diagnosis not present

## 2024-03-10 DIAGNOSIS — Z9889 Other specified postprocedural states: Secondary | ICD-10-CM

## 2024-03-12 LAB — VAS US ABI WITH/WO TBI
Left ABI: 1.18
Right ABI: 1.05

## 2024-03-13 ENCOUNTER — Ambulatory Visit (INDEPENDENT_AMBULATORY_CARE_PROVIDER_SITE_OTHER): Admitting: Vascular Surgery

## 2024-03-13 ENCOUNTER — Encounter (INDEPENDENT_AMBULATORY_CARE_PROVIDER_SITE_OTHER): Payer: Self-pay | Admitting: Vascular Surgery

## 2024-03-13 VITALS — BP 115/69 | HR 62 | Ht 71.0 in | Wt 154.0 lb

## 2024-03-13 DIAGNOSIS — E1142 Type 2 diabetes mellitus with diabetic polyneuropathy: Secondary | ICD-10-CM

## 2024-03-13 DIAGNOSIS — I10 Essential (primary) hypertension: Secondary | ICD-10-CM

## 2024-03-13 DIAGNOSIS — Z794 Long term (current) use of insulin: Secondary | ICD-10-CM

## 2024-03-13 DIAGNOSIS — Z9889 Other specified postprocedural states: Secondary | ICD-10-CM

## 2024-03-13 DIAGNOSIS — I739 Peripheral vascular disease, unspecified: Secondary | ICD-10-CM

## 2024-03-13 NOTE — Progress Notes (Signed)
 Subjective:    Patient ID: Mark Nash, male    DOB: December 15, 1955, 68 y.o.   MRN: [id number] Chief Complaint  Patient presents with   fu 1 week with Abis and staple removal see jd/fb POST OP. p    Mark Nash is a 68 year old male now 3 weeks postop from:  PROCEDURE: 1. Bilateral common femoral, superficial femoral and profunda femoris endarterectomy with bovine pericardial patch angioplasty. 2. Right lower extremity angiogram 3. Open angioplasty and stent placement right popliteal artery with a 6 mm x 75mm Viabahn stent   Patient has no complaints today.  He endorses claudication symptoms better.  He does however endorse swelling to his lower extremities is tender and is not walking as much as he would like to but overall feels better.  Bilateral groins with staples in place.  No wound dehiscence or infection today.  Patient is recovering as expected.  Vitals are remained stable.  Review of Systems  Constitutional: Negative.   Cardiovascular:  Positive for leg swelling.  Skin:  Positive for color change and wound.  All other systems reviewed and are negative.      Objective:   Physical Exam Vitals reviewed.  Constitutional:      Appearance: Normal appearance. He is normal weight.  HENT:     Head: Normocephalic.  Eyes:     Pupils: Pupils are equal, round, and reactive to light.  Cardiovascular:     Rate and Rhythm: Normal rate. Rhythm irregular.     Pulses: Normal pulses.     Heart sounds: Normal heart sounds.  Pulmonary:     Effort: Pulmonary effort is normal.     Breath sounds: Normal breath sounds.  Abdominal:     General: Abdomen is flat. Bowel sounds are normal.     Palpations: Abdomen is soft.  Musculoskeletal:        General: Swelling and tenderness present.     Right lower leg: Edema present.     Left lower leg: Edema present.  Skin:    General: Skin is warm and dry.     Capillary Refill: Capillary refill takes 2 to 3 seconds.  Neurological:      General: No focal deficit present.     Mental Status: He is alert and oriented to person, place, and time. Mental status is at baseline.  Psychiatric:        Mood and Affect: Mood normal.        Behavior: Behavior normal.        Thought Content: Thought content normal.        Judgment: Judgment normal.     BP 115/69   Pulse 62   Ht 5' 11 (1.803 m)   Wt 154 lb (69.9 kg)   BMI 21.48 kg/m   Past Medical History:  Diagnosis Date   Aortic atherosclerosis (HCC)    Aortic root dilatation (HCC)    Arthritis    Asthma    Atrial fibrillation and flutter (HCC)    a.) CHA2DS2VASc = 5 (age, HFrEF, HTN, vascular disease, T2DM) as of 02/19/2024; b.) s/p DCCV 05/20/2021 --> 150 J x 1 and 200 J x 3 (converted to NSR after 1st 200 J x 1 min with ERAF); c.) rate/rhythm maintained on oral digoxin  + metoprolol  succinate; chronically anticoagulated with apixaban    Calcaneal spur    Colon polyps    Constipation    COPD (chronic obstructive pulmonary disease) (HCC)    Coronary artery disease    DDD (  degenerative disc disease), lumbar    a.) s/p L3-L5 laminectomy/decompression in 2017   Diverticulosis    Dyspnea    GERD (gastroesophageal reflux disease)    Hepatitis C    a.) has not been Tx'd; b.) endorses IVDU ages [age]   HFrEF (heart failure with reduced ejection fraction) (HCC)    History of intravenous drug abuse    a.) age [age]   History of kidney stones    History of right cataract extraction    Hyperlipidemia    Hypertension 06/10/2018   Left inguinal hernia    NICM (nonischemic cardiomyopathy) (HCC)    On apixaban  therapy    Peripheral vascular disease (HCC)    Pulmonary hypertension (HCC)    Restless legs    Schizoaffective disorder (HCC)    a.) refuses to consult with psychiatry per PCP   Status post bilateral inguinal hernia repair    T2DM (type 2 diabetes mellitus) (HCC)    Thrombocytopenia (HCC)    Vitamin D  deficiency    Wears full dentures     Social History    Socioeconomic History   Marital status: Divorced    Spouse name: Not on file   Number of children: 2   Years of education: Not on file   Highest education level: 12th grade  Occupational History   Occupation: Disabled  Tobacco Use   Smoking status: Every Day    Current packs/day: 1.50    Average packs/day: 1.5 packs/day for 46.0 years (69.0 ttl pk-yrs)    Types: Cigarettes   Smokeless tobacco: Never  Vaping Use   Vaping status: Never Used  Substance and Sexual Activity   Alcohol use: No    Alcohol/week: 0.0 standard drinks of alcohol   Drug use: No   Sexual activity: Not Currently  Other Topics Concern   Not on file  Social History Narrative   Lives alone   Social Drivers of Health   Financial Resource Strain: Low Risk  (10/08/2023)   Received from [facility]   Overall Financial Resource Strain (CARDIA)    Difficulty of Paying Living Expenses: Not very hard  Food Insecurity: No Food Insecurity (02/20/2024)   Hunger Vital Sign    Worried About Running Out of Food in the Last Year: Never true    Ran Out of Food in the Last Year: Never true  Transportation Needs: No Transportation Needs (02/20/2024)   PRAPARE - Administrator, Civil Service (Medical): No    Lack of Transportation (Non-Medical): No  Physical Activity: Insufficiently Active (10/04/2023)   Exercise Vital Sign    Days of Exercise per Week: 1 day    Minutes of Exercise per Session: 10 min  Stress: No Stress Concern Present (10/04/2023)   [employer] of Occupational Health - Occupational Stress Questionnaire    Feeling of Stress : Not at all  Social Connections: Socially Isolated (02/20/2024)   Social Connection and Isolation Panel    Frequency of Communication with Friends and Family: Once a week    Frequency of Social Gatherings with Friends and Family: Never    Attends Religious Services: Never    Database administrator or Organizations: No    Attends Banker  Meetings: Never    Marital Status: Divorced  Catering manager Violence: Not At Risk (02/20/2024)   Humiliation, Afraid, Rape, and Kick questionnaire    Fear of Current or Ex-Partner: No    Emotionally Abused: No    Physically Abused:  No    Sexually Abused: No    Past Surgical History:  Procedure Laterality Date   APPLICATION OF CELL SAVER N/A 02/20/2024   Procedure: APPLICATION OF CELL SAVER;  Surgeon: Marea Selinda RAMAN, MD;  Location: [facility];  Service: Vascular;  Laterality: N/A;   CARDIOVERSION N/A 05/20/2021   Procedure: CARDIOVERSION;  Surgeon: Darron Deatrice LABOR, MD;  Location: [facility];  Service: Cardiovascular;  Laterality: N/A;   CATARACT EXTRACTION W/PHACO Right 10/31/2021   Procedure: CATARACT EXTRACTION PHACO AND INTRAOCULAR LENS PLACEMENT (IOC) RIGHT DIABETIC;  Surgeon: Myrna Adine Anes, MD;  Location: [facility];  Service: Ophthalmology;  Laterality: Right;  Diabetic 4.34 00:42.8   COLONOSCOPY WITH PROPOFOL  N/A 09/07/2017   Procedure: COLONOSCOPY WITH PROPOFOL ;  Surgeon: Janalyn Keene NOVAK, MD;  Location: [facility];  Service: Endoscopy;  Laterality: N/A;   ENDARTERECTOMY FEMORAL Bilateral 02/20/2024   Procedure: ENDARTERECTOMY, FEMORAL;  Surgeon: Marea Selinda RAMAN, MD;  Location: [facility];  Service: Vascular;  Laterality: Bilateral;   ESOPHAGOGASTRODUODENOSCOPY (EGD) WITH PROPOFOL  N/A 12/07/2017   Procedure: ESOPHAGOGASTRODUODENOSCOPY (EGD) WITH PROPOFOL ;  Surgeon: Janalyn Keene NOVAK, MD;  Location: [facility];  Service: Endoscopy;  Laterality: N/A;   FLEXIBLE SIGMOIDOSCOPY N/A 11/09/2017   Procedure: FLEXIBLE SIGMOIDOSCOPY;  Surgeon: Janalyn Keene NOVAK, MD;  Location: [facility];  Service: Endoscopy;  Laterality: N/A;   INGUINAL HERNIA REPAIR Bilateral 06/21/2018   Procedure: LAPAROSCOPIC BILATERAL INGUINAL HERNIA REPAIR;  Surgeon: Nicholaus Selinda Birmingham, MD;  Location: [facility];  Service: General;  Laterality: Bilateral;   INTRAVASCULAR STENT INCLUDING PTA  AND IMAGING Right 02/20/2024   Procedure: INSERTION, STENT, INTRAVASCULAR, PERCUTANEOUS, WITH IMAGING GUIDANCE;  Surgeon: Marea Selinda RAMAN, MD;  Location: [facility];  Service: Vascular;  Laterality: Right;  Popliteal stent   L3 TO L5 LAMINECTOMY FOR DECOMPRESSION  07/17/2016   [facility]   LOWER EXTREMITY ANGIOGRAM Right 02/20/2024   Procedure: GERALYN, LOWER EXTREMITY;  Surgeon: Marea Selinda RAMAN, MD;  Location: [facility];  Service: Vascular;  Laterality: Right;  Right leg   LOWER EXTREMITY ANGIOGRAPHY Right 11/05/2023   Procedure: Lower Extremity Angiography;  Surgeon: Marea Selinda RAMAN, MD;  Location: [facility];  Service: Cardiovascular;  Laterality: Right;   REPAIR OF CEREBROSPINAL FLUID LEAK N/A 09/08/2016   Procedure: Lumbar wound exploration, repair of pseudomenigocele;  Surgeon: Morene Hicks Ditty, MD;  Location: [facility];  Service: Neurosurgery;  Laterality: N/A;  Lumbar wound exploration, repair of pseudomenigocele   RIGHT/LEFT HEART CATH AND CORONARY ANGIOGRAPHY N/A 01/29/2023   Procedure: RIGHT/LEFT HEART CATH AND CORONARY ANGIOGRAPHY;  Surgeon: Darron Deatrice LABOR, MD;  Location: [facility];  Service: Cardiovascular;  Laterality: N/A;   SINUSOTOMY     as a child    Family History  Problem Relation Age of Onset   Heart failure Mother    Hypertension Mother    Diverticulitis Mother    Colitis Mother    Diabetes Father    Hypertension Father    Heart disease Father    Diabetes Sister    Heart failure Brother    Diverticulitis Brother     Allergies  Allergen Reactions   Penicillins Hives and Swelling    Childhood allergy.  Has tolerated 3rd generation cephalosporin (CEFTRIAXONE ) without documented ADRs   Aspirin  Nausea Only   Ibuprofen Nausea Only   Lyrica  [Pregabalin ] Nausea Only   Acetaminophen  Nausea Only       Latest Ref Rng & Units 02/21/2024   12:10 AM 12/24/2023    3:59  PM 07/04/2023    2:06 PM  CBC  WBC 4.0 - 10.5 K/uL 8.3  6.6  7.1    Hemoglobin 13.0 - 17.0 g/dL 85.0  83.2  83.8   Hematocrit 39.0 - 52.0 % 45.1  50.8  49.3   Platelets 150 - 400 K/uL 136  171  158       CMP     Component Value Date/Time   NA 136 02/21/2024 0010   NA 139 12/24/2023 1559   K 4.0 02/21/2024 0010   CL 104 02/21/2024 0010   CO2 24 02/21/2024 0010   GLUCOSE 184 (H) 02/21/2024 0010   BUN 19 02/21/2024 0010   BUN 14 12/24/2023 1559   CREATININE 0.75 02/21/2024 0010   CREATININE 0.95 11/19/2023 1453   CALCIUM 8.5 (L) 02/21/2024 0010   PROT 5.9 (L) 11/19/2023 1453   PROT 5.9 (L) 11/10/2015 1045   ALBUMIN 4.0 02/27/2023 1517   ALBUMIN 4.3 11/10/2015 1045   AST 17 11/19/2023 1453   ALT 17 11/19/2023 1453   ALKPHOS 87 01/22/2023 0615   BILITOT 1.3 (H) 11/19/2023 1453   BILITOT 1.0 11/10/2015 1045   EGFR 82 12/24/2023 1559   GFRNONAA >60 02/21/2024 0010   GFRNONAA 100 09/15/2020 1500     VAS US  ABI WITH/WO TBI Result Date: 03/12/2024  LOWER EXTREMITY DOPPLER STUDY Patient Name:  Mark Nash  Date of Exam:   03/10/2024 Medical Rec #: [id number]        Accession #:    [id number] Date of Birth: [birth year]        Patient Gender: M Patient Age:   22 years Exam Location:  [facility] Procedure:      VAS US  ABI WITH/WO TBI Referring Phys: --------------------------------------------------------------------------------  Indications: Peripheral artery disease.  Vascular Interventions: 02/20/2024 1. Left common femoral, profunda femoris, and                         superficial femoral artery endarterectomies                         2. Right common femoral, profunda femoris, and                         superficial femoral artery endarterectomies                         3. Right lower extremity angiogram                         4. Stent placement to the right popliteal artery with 6                         mm diameter by 7.5 cm length Viabahn stent. Comparison Study: 09/2023 Performing Technologist: Jerel Croak RVT  Examination Guidelines:  A complete evaluation includes at minimum, Doppler waveform signals and systolic blood pressure reading at the level of bilateral brachial, anterior tibial, and posterior tibial arteries, when vessel segments are accessible. Bilateral testing is considered an integral part of a complete examination. Photoelectric Plethysmograph (PPG) waveforms and toe systolic pressure readings are included as required and additional duplex testing as needed. Limited examinations for reoccurring indications may be performed as noted.  ABI Findings: +---------+------------------+-----+---------+--------+ Right    Rt Pressure (mmHg)IndexWaveform Comment  +---------+------------------+-----+---------+--------+ Brachial 120                                      +---------+------------------+-----+---------+--------+  PTA      137               1.10 triphasic         +---------+------------------+-----+---------+--------+ DP       131               1.05 triphasic         +---------+------------------+-----+---------+--------+ Great Toe115               0.92 Normal            +---------+------------------+-----+---------+--------+ +---------+------------------+-----+---------+-------+ Left     Lt Pressure (mmHg)IndexWaveform Comment +---------+------------------+-----+---------+-------+ Brachial 125                                     +---------+------------------+-----+---------+-------+ PTA      142               1.14 triphasic        +---------+------------------+-----+---------+-------+ DP       147               1.18 triphasic        +---------+------------------+-----+---------+-------+ Great Toe122               0.98 Normal           +---------+------------------+-----+---------+-------+ +-------+-----------+-----------+------------+------------+ ABI/TBIToday's ABIToday's TBIPrevious ABIPrevious TBI +-------+-----------+-----------+------------+------------+ Right   1.05       .92        .79         .62          +-------+-----------+-----------+------------+------------+ Left   1.18       .98        .99         .90          +-------+-----------+-----------+------------+------------+ Bilateral ABIs appear increased. Right TBIs appear increased.  Summary: Right: Resting right ankle-brachial index is within normal range. The right toe-brachial index is normal. Left: Resting left ankle-brachial index is within normal range. The left toe-brachial index is normal. *See table(s) above for measurements and observations.  Electronically signed by Selinda Gu MD on 03/12/2024 at 2:40:34 PM.    Final        Assessment & Plan:   1. Peripheral arterial disease with history of revascularization (HCC) (Primary) Patient now 3 weeks postop from bilateral femoral endarterectomies.  Patient is recovering as expected.  I remove the patient's staples and bilateral groins today.  No hematoma seroma or wound dehiscence noted.  Patient is healing well.  Patient's bilateral lower extremities were extremely erythemic with some swelling prior surgery.  Today presents with +3 edema and reperfusion syndrome.  He is erythemic from his toes to mid calf on bilateral lower extremities.  Dr. Cordella Shawl reports helpful enough to come and look at the patient's lower extremities.  We both agree the patient needs bilateral Unna boot wraps today.  Patient will return to clinic next Tuesday for bilateral lower extremity Unna boot wrap changes and to be seen by Dr. Selinda Gu as he is in clinic same day.  Patient did undergo bilateral lower extremity arterial duplex ultrasounds with ABIs.  Patient's ABIs are as follows. Right ABI today 1.05 previous right ABI .79 Left ABI today 1.18 previous left ABI .99  Patient denies any claudication symptoms at this time.  However he does have difficulty walking due to the vascular insufficiency and lymphedema to his lower extremities.  2. Essential  (primary) hypertension Continue antihypertensive medications as already ordered, these medications have been reviewed and there are no changes at this time.  3. Type 2 diabetes mellitus with diabetic polyneuropathy, with long-term current use of insulin  (HCC) Continue hypoglycemic medications as already ordered, these medications have been reviewed and there are no changes at this time.  Hgb A1C to be monitored as already arranged by primary service   Current Outpatient Medications on File Prior to Visit  Medication Sig Dispense Refill   apixaban  (ELIQUIS ) 5 MG TABS tablet Take 1 tablet by mouth twice daily 180 tablet 1   blood glucose meter kit and supplies KIT Dispense based on patient and insurance preference. Use up to tid E11.65, LON:99 1 each 0   Blood Glucose Monitoring Suppl (ONETOUCH VERIO FLEX SYSTEM) w/Device KIT Use to check blood sugar up to four times daily as directed 1 kit 0   Budeson-Glycopyrrol-Formoterol  (BREZTRI  AEROSPHERE) 160-9-4.8 MCG/ACT AERO Inhale 2 puffs into the lungs 2 (two) times daily. Patient receives via [place] Patient Assistance. 32.1 g 3   dapagliflozin  propanediol (FARXIGA ) 10 MG TABS tablet Take 1 tablet (10 mg total) by mouth daily. 90 tablet 3   digoxin  (LANOXIN ) 0.125 MG tablet Take 0.5 tablets (0.0625 mg total) by mouth daily. 90 tablet 3   gabapentin  (NEURONTIN ) 300 MG capsule Take 300-600 mg by mouth See admin instructions. Take 1 capsule (300 mg) by mouth in the morning, take 1 capsule (300 mg) by mouth at noon, and 2 capsules (600 mg) by mouth in the evening     glucose blood (ONETOUCH VERIO) test strip Use to check blood sugar up to four times daily as directed 400 each 3   Insulin  Degludec (TRESIBA ) 100 UNIT/ML SOLN Inject 8 Units into the skin daily at 12 noon. 15 mL 1   Lancet Devices MISC 1 each by Does not apply route 3 (three) times daily as needed. What ins will cover dx:E11.65, LON:99 tid 100 each 12   Lancets (ONETOUCH DELICA PLUS LANCET30G)  MISC Use to check blood sugar up to four times daily as directed 400 each 3   losartan  (COZAAR ) 25 MG tablet TAKE 1/2 (ONE-HALF) TABLET BY MOUTH ONCE DAILY SKIP  THE  DOSE  IF  SYSTOLIC  BP  LESS  THAN  110  MMHG 45 tablet 0   metoprolol  (TOPROL -XL) 200 MG 24 hr tablet Take 1 tablet (200 mg total) by mouth daily. Take with or immediately following a meal. 90 tablet 3   omeprazole  (PRILOSEC) 20 MG capsule Take 20 mg by mouth 3 (three) times daily.     oxyCODONE  (ROXICODONE ) 5 MG immediate release tablet Take 1 tablet (5 mg total) by mouth every 6 (six) hours as needed. 25 tablet 0   pravastatin  (PRAVACHOL ) 20 MG tablet Take 1 tablet (20 mg total) by mouth daily with supper. 90 tablet 3   Semaglutide ,0.25 or 0.5MG /DOS, (OZEMPIC , 0.25 OR 0.5 MG/DOSE,) 2 MG/1.5ML SOPN Inject 0.25 mg into the skin once a week. (Patient taking differently: Inject 0.5 mg into the skin once a week.) 2 mL 0   tiZANidine  (ZANAFLEX ) 4 MG tablet Take 4 mg by mouth every 12 (twelve) hours.     traMADol  (ULTRAM ) 50 MG tablet Take 50 mg by mouth every 12 (twelve) hours.     triamcinolone  cream (KENALOG ) 0.1 % Apply topically 2 (two) times daily.     Vitamin D , Ergocalciferol , (DRISDOL ) 1.25 MG (50000 UNIT) CAPS capsule TAKE ONE CAPSULE BY MOUTH  EVERY 14 DAYS 6 capsule 1   No current facility-administered medications on file prior to visit.    There are no Patient Instructions on file for this visit. No follow-ups on file.   Gwendlyn JONELLE Shank, NP

## 2024-03-18 ENCOUNTER — Encounter (INDEPENDENT_AMBULATORY_CARE_PROVIDER_SITE_OTHER): Payer: Self-pay | Admitting: Vascular Surgery

## 2024-03-18 ENCOUNTER — Ambulatory Visit (INDEPENDENT_AMBULATORY_CARE_PROVIDER_SITE_OTHER): Admitting: Vascular Surgery

## 2024-03-18 VITALS — BP 117/69 | HR 53 | Ht 71.0 in | Wt 154.0 lb

## 2024-03-18 DIAGNOSIS — I420 Dilated cardiomyopathy: Secondary | ICD-10-CM

## 2024-03-18 DIAGNOSIS — I10 Essential (primary) hypertension: Secondary | ICD-10-CM

## 2024-03-18 DIAGNOSIS — E119 Type 2 diabetes mellitus without complications: Secondary | ICD-10-CM

## 2024-03-18 DIAGNOSIS — I70299 Other atherosclerosis of native arteries of extremities, unspecified extremity: Secondary | ICD-10-CM

## 2024-03-18 DIAGNOSIS — L97909 Non-pressure chronic ulcer of unspecified part of unspecified lower leg with unspecified severity: Secondary | ICD-10-CM

## 2024-03-18 NOTE — Assessment & Plan Note (Signed)
 Patient is recovering as expected from extensive revascularization including bilateral femoral endarterectomies as well as endoluminal intervention.  His reperfusion swelling is better with the Unna boots for 1 week.  Will going to come for Unna boots and go to compression socks at this time.  If his marked swelling returns or if he has skin weeping he needs to come to our office to be wrapped again.  Plan follow-up in 2 to 3 months with ABIs as.  Continue current medical regimen including Pravachol  and Eliquis .

## 2024-03-18 NOTE — Assessment & Plan Note (Signed)
 blood glucose control important in reducing the progression of atherosclerotic disease. Also, involved in wound healing. On appropriate medications.

## 2024-03-18 NOTE — Progress Notes (Signed)
 Patient ID: [name], male   DOB: [birth year], 68 y.o.   MRN: [id number]  Chief Complaint  Patient presents with   Mark Nash f/u with rewrap unna and wound check. see jd    HPI Mark Nash is a 68 y.o. male.  Patient returns after wrapping his legs and into boots last week due to severe swelling with reperfusion syndrome.  His ABIs were normal.  His pain is much better and his swelling is much better.  He does still have some swelling in the upper part of his legs.  His incisions remain clean, dry, and intact.   Past Medical History:  Diagnosis Date   Aortic atherosclerosis (HCC)    Aortic root dilatation (HCC)    Arthritis    Asthma    Atrial fibrillation and flutter (HCC)    a.) CHA2DS2VASc = 5 (age, HFrEF, HTN, vascular disease, T2DM) as of 02/19/2024; b.) s/p DCCV 05/20/2021 --> 150 J x 1 and 200 J x 3 (converted to NSR after 1st 200 J x 1 min with ERAF); c.) rate/rhythm maintained on oral digoxin  + metoprolol  succinate; chronically anticoagulated with apixaban    Calcaneal spur    Colon polyps    Constipation    COPD (chronic obstructive pulmonary disease) (HCC)    Coronary artery disease    DDD (degenerative disc disease), lumbar    a.) s/p L3-L5 laminectomy/decompression in 2017   Diverticulosis    Dyspnea    GERD (gastroesophageal reflux disease)    Hepatitis C    a.) has not been Tx'd; b.) endorses IVDU ages 35-25   HFrEF (heart failure with reduced ejection fraction) (HCC)    History of intravenous drug abuse    a.) age [age]   History of kidney stones    History of right cataract extraction    Hyperlipidemia    Hypertension 06/10/2018   Left inguinal hernia    NICM (nonischemic cardiomyopathy) (HCC)    On apixaban  therapy    Peripheral vascular disease (HCC)    Pulmonary hypertension (HCC)    Restless legs    Schizoaffective disorder (HCC)    a.) refuses to consult with psychiatry per PCP   Status post bilateral inguinal hernia repair    T2DM (type  2 diabetes mellitus) (HCC)    Thrombocytopenia (HCC)    Vitamin D  deficiency    Wears full dentures     Past Surgical History:  Procedure Laterality Date   APPLICATION OF CELL SAVER N/A 02/20/2024   Procedure: APPLICATION OF CELL SAVER;  Surgeon: Marea Selinda RAMAN, MD;  Location: [facility];  Service: Vascular;  Laterality: N/A;   CARDIOVERSION N/A 05/20/2021   Procedure: CARDIOVERSION;  Surgeon: Darron Deatrice LABOR, MD;  Location: [facility];  Service: Cardiovascular;  Laterality: N/A;   CATARACT EXTRACTION W/PHACO Right 10/31/2021   Procedure: CATARACT EXTRACTION PHACO AND INTRAOCULAR LENS PLACEMENT (IOC) RIGHT DIABETIC;  Surgeon: Myrna Adine Anes, MD;  Location: [facility];  Service: Ophthalmology;  Laterality: Right;  Diabetic 4.34 00:42.8   COLONOSCOPY WITH PROPOFOL  N/A 09/07/2017   Procedure: COLONOSCOPY WITH PROPOFOL ;  Surgeon: Janalyn Keene NOVAK, MD;  Location: [facility];  Service: Endoscopy;  Laterality: N/A;   ENDARTERECTOMY FEMORAL Bilateral 02/20/2024   Procedure: ENDARTERECTOMY, FEMORAL;  Surgeon: Marea Selinda RAMAN, MD;  Location: [facility];  Service: Vascular;  Laterality: Bilateral;   ESOPHAGOGASTRODUODENOSCOPY (EGD) WITH PROPOFOL  N/A 12/07/2017   Procedure: ESOPHAGOGASTRODUODENOSCOPY (EGD) WITH PROPOFOL ;  Surgeon: Janalyn Keene NOVAK, MD;  Location: [facility];  Service: Endoscopy;  Laterality: N/A;   FLEXIBLE SIGMOIDOSCOPY N/A 11/09/2017   Procedure: FLEXIBLE SIGMOIDOSCOPY;  Surgeon: Janalyn Keene NOVAK, MD;  Location: [facility];  Service: Endoscopy;  Laterality: N/A;   INGUINAL HERNIA REPAIR Bilateral 06/21/2018   Procedure: LAPAROSCOPIC BILATERAL INGUINAL HERNIA REPAIR;  Surgeon: Nicholaus Selinda Birmingham, MD;  Location: [facility];  Service: General;  Laterality: Bilateral;   INTRAVASCULAR STENT INCLUDING PTA AND IMAGING Right 02/20/2024   Procedure: INSERTION, STENT, INTRAVASCULAR, PERCUTANEOUS, WITH IMAGING GUIDANCE;  Surgeon: Marea Selinda RAMAN, MD;  Location: [facility];   Service: Vascular;  Laterality: Right;  Popliteal stent   L3 TO L5 LAMINECTOMY FOR DECOMPRESSION  07/17/2016   [facility]   LOWER EXTREMITY ANGIOGRAM Right 02/20/2024   Procedure: GERALYN, LOWER EXTREMITY;  Surgeon: Marea Selinda RAMAN, MD;  Location: [facility];  Service: Vascular;  Laterality: Right;  Right leg   LOWER EXTREMITY ANGIOGRAPHY Right 11/05/2023   Procedure: Lower Extremity Angiography;  Surgeon: Marea Selinda RAMAN, MD;  Location: [facility];  Service: Cardiovascular;  Laterality: Right;   REPAIR OF CEREBROSPINAL FLUID LEAK N/A 09/08/2016   Procedure: Lumbar wound exploration, repair of pseudomenigocele;  Surgeon: Morene Hicks Ditty, MD;  Location: [facility];  Service: Neurosurgery;  Laterality: N/A;  Lumbar wound exploration, repair of pseudomenigocele   RIGHT/LEFT HEART CATH AND CORONARY ANGIOGRAPHY N/A 01/29/2023   Procedure: RIGHT/LEFT HEART CATH AND CORONARY ANGIOGRAPHY;  Surgeon: Darron Deatrice LABOR, MD;  Location: [facility];  Service: Cardiovascular;  Laterality: N/A;   SINUSOTOMY     as a child      Allergies  Allergen Reactions   Penicillins Hives and Swelling    Childhood allergy.  Has tolerated 3rd generation cephalosporin (CEFTRIAXONE ) without documented ADRs   Aspirin  Nausea Only   Ibuprofen Nausea Only   Lyrica  [Pregabalin ] Nausea Only   Acetaminophen  Nausea Only    Current Outpatient Medications  Medication Sig Dispense Refill   apixaban  (ELIQUIS ) 5 MG TABS tablet Take 1 tablet by mouth twice daily 180 tablet 1   blood glucose meter kit and supplies KIT Dispense based on patient and insurance preference. Use up to tid E11.65, LON:99 1 each 0   Blood Glucose Monitoring Suppl (ONETOUCH VERIO FLEX SYSTEM) w/Device KIT Use to check blood sugar up to four times daily as directed 1 kit 0   Budeson-Glycopyrrol-Formoterol  (BREZTRI  AEROSPHERE) 160-9-4.8 MCG/ACT AERO Inhale 2 puffs into the lungs 2 (two) times daily. Patient receives via  [employer] Patient Assistance. 32.1 g 3   dapagliflozin  propanediol (FARXIGA ) 10 MG TABS tablet Take 1 tablet (10 mg total) by mouth daily. 90 tablet 3   digoxin  (LANOXIN ) 0.125 MG tablet Take 0.5 tablets (0.0625 mg total) by mouth daily. 90 tablet 3   gabapentin  (NEURONTIN ) 300 MG capsule Take 300-600 mg by mouth See admin instructions. Take 1 capsule (300 mg) by mouth in the morning, take 1 capsule (300 mg) by mouth at noon, and 2 capsules (600 mg) by mouth in the evening     glucose blood (ONETOUCH VERIO) test strip Use to check blood sugar up to four times daily as directed 400 each 3   Insulin  Degludec (TRESIBA ) 100 UNIT/ML SOLN Inject 8 Units into the skin daily at 12 noon. 15 mL 1   Lancet Devices MISC 1 each by Does not apply route 3 (three) times daily as needed. What ins will cover dx:E11.65, LON:99 tid 100 each 12   Lancets (ONETOUCH DELICA PLUS LANCET30G) MISC Use to check blood  sugar up to four times daily as directed 400 each 3   losartan  (COZAAR ) 25 MG tablet TAKE 1/2 (ONE-HALF) TABLET BY MOUTH ONCE DAILY SKIP  THE  DOSE  IF  SYSTOLIC  BP  LESS  THAN  110  MMHG 45 tablet 0   metoprolol  (TOPROL -XL) 200 MG 24 hr tablet Take 1 tablet (200 mg total) by mouth daily. Take with or immediately following a meal. 90 tablet 3   omeprazole  (PRILOSEC) 20 MG capsule Take 20 mg by mouth 3 (three) times daily.     oxyCODONE  (ROXICODONE ) 5 MG immediate release tablet Take 1 tablet (5 mg total) by mouth every 6 (six) hours as needed. 25 tablet 0   pravastatin  (PRAVACHOL ) 20 MG tablet Take 1 tablet (20 mg total) by mouth daily with supper. 90 tablet 3   Semaglutide ,0.25 or 0.5MG /DOS, (OZEMPIC , 0.25 OR 0.5 MG/DOSE,) 2 MG/1.5ML SOPN Inject 0.25 mg into the skin once a week. (Patient taking differently: Inject 0.5 mg into the skin once a week.) 2 mL 0   tiZANidine  (ZANAFLEX ) 4 MG tablet Take 4 mg by mouth every 12 (twelve) hours.     traMADol  (ULTRAM ) 50 MG tablet Take 50 mg by mouth every 12 (twelve) hours.      triamcinolone  cream (KENALOG ) 0.1 % Apply topically 2 (two) times daily.     Vitamin D , Ergocalciferol , (DRISDOL ) 1.25 MG (50000 UNIT) CAPS capsule TAKE ONE CAPSULE BY MOUTH EVERY 14 DAYS 6 capsule 1   No current facility-administered medications for this visit.        Physical Exam BP 117/69   Pulse (!) 53   Ht 5' 11 (1.803 m)   Wt 154 lb (69.9 kg)   BMI 21.48 kg/m  Gen:  WD/WN, NAD Skin: incision C/D/I Ext: Leg swelling is markedly improved after 1 week in Unna boots.  Fairly mild bilaterally.  Perfusion remains normal.     Assessment/Plan:  Atherosclerosis of artery of extremity with ulceration (HCC) Patient is recovering as expected from extensive revascularization including bilateral femoral endarterectomies as well as endoluminal intervention.  His reperfusion swelling is better with the Unna boots for 1 week.  Will going to come for Unna boots and go to compression socks at this time.  If his marked swelling returns or if he has skin weeping he needs to come to our office to be wrapped again.  Plan follow-up in 2 to 3 months with ABIs as.  Continue current medical regimen including Pravachol  and Eliquis .  Essential (primary) hypertension blood pressure control important in reducing the progression of atherosclerotic disease. On appropriate oral medications.   Dilated cardiomyopathy (HCC) Could certainly worsen lower extremity swelling.  Diabetes mellitus with coincident hypertension (HCC) blood glucose control important in reducing the progression of atherosclerotic disease. Also, involved in wound healing. On appropriate medications.      Selinda Gu 03/18/2024, 3:06 PM   This note was created with Dragon medical transcription system.  Any errors from dictation are unintentional.

## 2024-03-18 NOTE — Assessment & Plan Note (Deleted)
 Patient is recovering as expected from extensive revascularization including bilateral femoral endarterectomies as well as endoluminal intervention.  His reperfusion swelling is better with the Unna boots for 1 week.  Will going to come for Unna boots and go to compression socks at this time.  If his marked swelling returns or if he has skin weeping he needs to come to our office to be wrapped again.  Plan follow-up in 2 to 3 months with ABIs as.  Continue current medical regimen including Pravachol  and Eliquis .

## 2024-03-18 NOTE — Assessment & Plan Note (Signed)
Could certainly worsen lower extremity swelling

## 2024-03-18 NOTE — Assessment & Plan Note (Signed)
 blood pressure control important in reducing the progression of atherosclerotic disease. On appropriate oral medications.

## 2024-03-21 ENCOUNTER — Other Ambulatory Visit: Payer: Self-pay | Admitting: Pharmacist

## 2024-03-21 DIAGNOSIS — E1142 Type 2 diabetes mellitus with diabetic polyneuropathy: Secondary | ICD-10-CM

## 2024-03-21 NOTE — Patient Instructions (Signed)
 As of 7/25, Ozempic  will no longer be automatically refilled from [employer] patient assistance program. To request refills after this date, program must receive a refill request form from office up to 30 days prior to refill being due.  Please monitor your supply of Ozempic  at home and when you have only a 1 month supply remaining, contact Shasta Spear at [phone] to request she start refill process for you.  Thank you!  Sharyle Sia, PharmD, [facility] Clinical Pharmacist [facility] [phone]

## 2024-03-21 NOTE — Progress Notes (Signed)
   03/21/2024  Patient ID: Mark Nash, male   DOB: [birth year], 68 y.o.   MRN: [id number]  Patient enrolled in Ozempic  patient assistance program from [employer].  As of 7/25, Ozempic  will no longer be eligible for automatic refill from [employer]. To request refills after this date, program must receive a refill request form from office up to 30 days prior to refill being due.  Outreach to patient today and provide this update. Advise patient to monitor supply of Ozempic  at home and when has only a 1 month supply remaining, to contact Shasta Spear: Phone: [phone] to request CPhT start refill form with PCP to be sent to program.  Sharyle Sia, PharmD, [facility] Clinical Pharmacist [facility] [phone]

## 2024-03-25 ENCOUNTER — Encounter: Payer: Self-pay | Admitting: Family Medicine

## 2024-03-25 ENCOUNTER — Other Ambulatory Visit: Payer: Self-pay

## 2024-03-25 ENCOUNTER — Ambulatory Visit (INDEPENDENT_AMBULATORY_CARE_PROVIDER_SITE_OTHER): Admitting: Family Medicine

## 2024-03-25 VITALS — BP 116/62 | HR 67 | Temp 98.2°F | Resp 18 | Ht 71.0 in | Wt 156.1 lb

## 2024-03-25 DIAGNOSIS — I25118 Atherosclerotic heart disease of native coronary artery with other forms of angina pectoris: Secondary | ICD-10-CM | POA: Diagnosis not present

## 2024-03-25 DIAGNOSIS — Z794 Long term (current) use of insulin: Secondary | ICD-10-CM | POA: Diagnosis not present

## 2024-03-25 DIAGNOSIS — I420 Dilated cardiomyopathy: Secondary | ICD-10-CM | POA: Diagnosis not present

## 2024-03-25 DIAGNOSIS — B182 Chronic viral hepatitis C: Secondary | ICD-10-CM

## 2024-03-25 DIAGNOSIS — F258 Other schizoaffective disorders: Secondary | ICD-10-CM

## 2024-03-25 DIAGNOSIS — I5020 Unspecified systolic (congestive) heart failure: Secondary | ICD-10-CM

## 2024-03-25 DIAGNOSIS — E1142 Type 2 diabetes mellitus with diabetic polyneuropathy: Secondary | ICD-10-CM | POA: Diagnosis not present

## 2024-03-25 DIAGNOSIS — F259 Schizoaffective disorder, unspecified: Secondary | ICD-10-CM | POA: Insufficient documentation

## 2024-03-25 DIAGNOSIS — J449 Chronic obstructive pulmonary disease, unspecified: Secondary | ICD-10-CM

## 2024-03-25 DIAGNOSIS — I482 Chronic atrial fibrillation, unspecified: Secondary | ICD-10-CM | POA: Diagnosis not present

## 2024-03-25 DIAGNOSIS — I739 Peripheral vascular disease, unspecified: Secondary | ICD-10-CM | POA: Diagnosis not present

## 2024-03-25 LAB — POCT GLYCOSYLATED HEMOGLOBIN (HGB A1C): Hemoglobin A1C: 7.2 % — AB (ref 4.0–5.6)

## 2024-03-25 MED ORDER — INSULIN DEGLUDEC 100 UNIT/ML ~~LOC~~ SOPN
10.0000 [IU] | PEN_INJECTOR | Freq: Every day | SUBCUTANEOUS | 0 refills | Status: DC
  Filled 2024-03-25: qty 15, 30d supply, fill #0

## 2024-03-25 NOTE — Progress Notes (Signed)
 Name: Mark Nash   MRN: [id number]    DOB: 1956-06-17   Date:03/25/2024       Progress Note  Subjective  Chief Complaint  Chief Complaint  Patient presents with   Diabetes   COPD   PAD   Discussed the use of AI scribe software for clinical note transcription with the patient, who gave verbal consent to proceed.  History of Present Illness Mark Nash is a 68 year old male with peripheral arterial disease and diabetes who presents for a regular follow-up visit.  He recently underwent stent placement in both legs due to peripheral arterial disease causing claudication. Post-surgery, he experiences persistent pain and swelling from the surgical sites down to his calves.  The pain is described as aching, with redness and swelling in the legs. Compression wraps have temporarily reduced the swelling. He was recently seen by vascular surgeon who gave him reassurance   His diabetes type 2  management has improved, with a recent A1c reduction from 10.4% in March to 7.2%. He monitors blood sugar levels and adjusts insulin  dosage, currently using Tresiba  at 10 units. He has run out of Tresiba  due to pharmacy delays. He also takes Farxiga  and Ozempic , contributing to improved glucose control. Occasional nausea and hunger are noted, but overall diabetes management is stable.  He has chronic atrial fibrillation and coronary artery disease, managed with Eliquis , digoxin , and metoprolol . Occasional chest discomfort occurs, particularly when coughing, but not during physical activity. No significant chest pain has been experienced recently.  He has COPD and continues to smoke, experiencing a persistent cough throughout the day. He uses Breo for COPD management but is not ready to quit smoking.  He has a history of schizoaffective disorder but reports no current symptoms or need for psychiatric follow-up. He has not been on psychiatric medications for a long time and does not wish to pursue  treatment.  He has chronic hepatitis C and is considering vaccination for hepatitis A and B to prevent further liver complications.    Patient Active Problem List   Diagnosis Date Noted   Coronary artery disease of native artery of native heart with stable angina pectoris (HCC) 02/21/2024   Typical atrial flutter (HCC) 02/21/2024   Dilated cardiomyopathy (HCC) 02/21/2024   Atherosclerosis of artery of extremity with ulceration (HCC) 02/20/2024   Atherosclerosis of native arteries of extremity with intermittent claudication (HCC) 10/09/2023   HFrEF (heart failure with reduced ejection fraction) (HCC) 01/30/2023   Chronic combined systolic and diastolic congestive heart failure (HCC) 01/28/2023   Pressure injury of skin 01/28/2023   CHF (congestive heart failure) (HCC) 01/26/2023   Senile purpura (HCC) 11/15/2022   Chronic a-fib (HCC) 03/15/2022   Type 2 diabetes mellitus with diabetic polyneuropathy, with long-term current use of insulin  (HCC) 03/16/2021   DDD (degenerative disc disease), lumbar 03/06/2019   Primary osteoarthritis involving multiple joints 03/06/2019   Lumbar radiculopathy 11/05/2018   Lumbar post-laminectomy syndrome 11/05/2018   Diabetic polyneuropathy associated with type 2 diabetes mellitus (HCC) 11/05/2018   CLE (columnar lined esophagus)    Duodenum ulcer    History of colonic polyps    Polyp of sigmoid colon    Diverticulosis of large intestine without diverticulitis    Benign neoplasm of ascending colon    Osteoarthritis of both hands 09/04/2017   Chronic pain of left knee 09/04/2017   Chronic hepatitis C without hepatic coma (HCC) 06/01/2016   Lumbar herniated disc 11/24/2015   Thrombocytopenia (HCC) 11/11/2015  Hepatitis C antibody test positive 07/13/2015   Chronic constipation 07/13/2015   Constipation 07/13/2015   Diabetes mellitus with coincident hypertension (HCC) 03/08/2015   Dyslipidemia 03/08/2015   Paranoid schizophrenia (HCC) 03/08/2015    Restless leg 03/08/2015   Callus of foot 03/08/2015   Claudication (HCC) 03/08/2015   Chronic obstructive pulmonary disease (HCC) 03/08/2015   Corn or callus 03/08/2015   Essential (primary) hypertension 03/08/2015   HLD (hyperlipidemia) 03/08/2015   Peripheral vascular disease (HCC) 03/08/2015   Polyneuropathy 03/08/2015   Current tobacco use 03/08/2015   Vitamin D  deficiency 04/26/2009    Past Surgical History:  Procedure Laterality Date   APPLICATION OF CELL SAVER N/A 02/20/2024   Procedure: APPLICATION OF CELL SAVER;  Surgeon: Marea Selinda RAMAN, MD;  Location: [facility];  Service: Vascular;  Laterality: N/A;   CARDIOVERSION N/A 05/20/2021   Procedure: CARDIOVERSION;  Surgeon: Darron Deatrice LABOR, MD;  Location: [facility];  Service: Cardiovascular;  Laterality: N/A;   CATARACT EXTRACTION W/PHACO Right 10/31/2021   Procedure: CATARACT EXTRACTION PHACO AND INTRAOCULAR LENS PLACEMENT (IOC) RIGHT DIABETIC;  Surgeon: Myrna Adine Anes, MD;  Location: [facility];  Service: Ophthalmology;  Laterality: Right;  Diabetic 4.34 00:42.8   COLONOSCOPY WITH PROPOFOL  N/A 09/07/2017   Procedure: COLONOSCOPY WITH PROPOFOL ;  Surgeon: Janalyn Keene NOVAK, MD;  Location: [facility];  Service: Endoscopy;  Laterality: N/A;   ENDARTERECTOMY FEMORAL Bilateral 02/20/2024   Procedure: ENDARTERECTOMY, FEMORAL;  Surgeon: Marea Selinda RAMAN, MD;  Location: [facility];  Service: Vascular;  Laterality: Bilateral;   ESOPHAGOGASTRODUODENOSCOPY (EGD) WITH PROPOFOL  N/A 12/07/2017   Procedure: ESOPHAGOGASTRODUODENOSCOPY (EGD) WITH PROPOFOL ;  Surgeon: Janalyn Keene NOVAK, MD;  Location: [facility];  Service: Endoscopy;  Laterality: N/A;   FLEXIBLE SIGMOIDOSCOPY N/A 11/09/2017   Procedure: FLEXIBLE SIGMOIDOSCOPY;  Surgeon: Janalyn Keene NOVAK, MD;  Location: [facility];  Service: Endoscopy;  Laterality: N/A;   INGUINAL HERNIA REPAIR Bilateral 06/21/2018   Procedure: LAPAROSCOPIC BILATERAL INGUINAL HERNIA REPAIR;   Surgeon: Nicholaus Selinda Birmingham, MD;  Location: [facility];  Service: General;  Laterality: Bilateral;   INTRAVASCULAR STENT INCLUDING PTA AND IMAGING Right 02/20/2024   Procedure: INSERTION, STENT, INTRAVASCULAR, PERCUTANEOUS, WITH IMAGING GUIDANCE;  Surgeon: Marea Selinda RAMAN, MD;  Location: [facility];  Service: Vascular;  Laterality: Right;  Popliteal stent   L3 TO L5 LAMINECTOMY FOR DECOMPRESSION  07/17/2016   [facility]   LOWER EXTREMITY ANGIOGRAM Right 02/20/2024   Procedure: GERALYN, LOWER EXTREMITY;  Surgeon: Marea Selinda RAMAN, MD;  Location: [facility];  Service: Vascular;  Laterality: Right;  Right leg   LOWER EXTREMITY ANGIOGRAPHY Right 11/05/2023   Procedure: Lower Extremity Angiography;  Surgeon: Marea Selinda RAMAN, MD;  Location: [facility];  Service: Cardiovascular;  Laterality: Right;   REPAIR OF CEREBROSPINAL FLUID LEAK N/A 09/08/2016   Procedure: Lumbar wound exploration, repair of pseudomenigocele;  Surgeon: Morene Hicks Ditty, MD;  Location: [facility];  Service: Neurosurgery;  Laterality: N/A;  Lumbar wound exploration, repair of pseudomenigocele   RIGHT/LEFT HEART CATH AND CORONARY ANGIOGRAPHY N/A 01/29/2023   Procedure: RIGHT/LEFT HEART CATH AND CORONARY ANGIOGRAPHY;  Surgeon: Darron Deatrice LABOR, MD;  Location: [facility];  Service: Cardiovascular;  Laterality: N/A;   SINUSOTOMY     as a child    Family History  Problem Relation Age of Onset   Heart failure Mother    Hypertension Mother    Diverticulitis Mother    Colitis Mother    Diabetes Father    Hypertension Father  Heart disease Father    Diabetes Sister    Heart failure Brother    Diverticulitis Brother     Social History   Tobacco Use   Smoking status: Every Day    Current packs/day: 1.50    Average packs/day: 1.5 packs/day for 46.0 years (69.0 ttl pk-yrs)    Types: Cigarettes   Smokeless tobacco: Never  Substance Use Topics   Alcohol use: No    Alcohol/week: 0.0 standard drinks of  alcohol     Current Outpatient Medications:    apixaban  (ELIQUIS ) 5 MG TABS tablet, Take 1 tablet by mouth twice daily, Disp: 180 tablet, Rfl: 1   blood glucose meter kit and supplies KIT, Dispense based on patient and insurance preference. Use up to tid E11.65, LON:99, Disp: 1 each, Rfl: 0   Blood Glucose Monitoring Suppl (ONETOUCH VERIO FLEX SYSTEM) w/Device KIT, Use to check blood sugar up to four times daily as directed, Disp: 1 kit, Rfl: 0   Budeson-Glycopyrrol-Formoterol  (BREZTRI  AEROSPHERE) 160-9-4.8 MCG/ACT AERO, Inhale 2 puffs into the lungs 2 (two) times daily. Patient receives via [employer] Patient Assistance., Disp: 32.1 g, Rfl: 3   dapagliflozin  propanediol (FARXIGA ) 10 MG TABS tablet, Take 1 tablet (10 mg total) by mouth daily., Disp: 90 tablet, Rfl: 3   digoxin  (LANOXIN ) 0.125 MG tablet, Take 0.5 tablets (0.0625 mg total) by mouth daily., Disp: 90 tablet, Rfl: 3   gabapentin  (NEURONTIN ) 300 MG capsule, Take 300-600 mg by mouth See admin instructions. Take 1 capsule (300 mg) by mouth in the morning, take 1 capsule (300 mg) by mouth at noon, and 2 capsules (600 mg) by mouth in the evening, Disp: , Rfl:    glucose blood (ONETOUCH VERIO) test strip, Use to check blood sugar up to four times daily as directed, Disp: 400 each, Rfl: 3   Insulin  Degludec (TRESIBA ) 100 UNIT/ML SOLN, Inject 8 Units into the skin daily at 12 noon., Disp: 15 mL, Rfl: 1   Lancet Devices MISC, 1 each by Does not apply route 3 (three) times daily as needed. What ins will cover dx:E11.65, LON:99 tid, Disp: 100 each, Rfl: 12   Lancets (ONETOUCH DELICA PLUS LANCET30G) MISC, Use to check blood sugar up to four times daily as directed, Disp: 400 each, Rfl: 3   losartan  (COZAAR ) 25 MG tablet, TAKE 1/2 (ONE-HALF) TABLET BY MOUTH ONCE DAILY SKIP  THE  DOSE  IF  SYSTOLIC  BP  LESS  THAN  110  MMHG, Disp: 45 tablet, Rfl: 0   metoprolol  (TOPROL -XL) 200 MG 24 hr tablet, Take 1 tablet (200 mg total) by mouth daily. Take with or  immediately following a meal., Disp: 90 tablet, Rfl: 3   omeprazole  (PRILOSEC) 20 MG capsule, Take 20 mg by mouth 3 (three) times daily., Disp: , Rfl:    oxyCODONE  (ROXICODONE ) 5 MG immediate release tablet, Take 1 tablet (5 mg total) by mouth every 6 (six) hours as needed., Disp: 25 tablet, Rfl: 0   pravastatin  (PRAVACHOL ) 20 MG tablet, Take 1 tablet (20 mg total) by mouth daily with supper., Disp: 90 tablet, Rfl: 3   Semaglutide ,0.25 or 0.5MG /DOS, (OZEMPIC , 0.25 OR 0.5 MG/DOSE,) 2 MG/1.5ML SOPN, Inject 0.25 mg into the skin once a week. (Patient taking differently: Inject 0.5 mg into the skin once a week.), Disp: 2 mL, Rfl: 0   tiZANidine  (ZANAFLEX ) 4 MG tablet, Take 4 mg by mouth every 12 (twelve) hours., Disp: , Rfl:    traMADol  (ULTRAM ) 50 MG tablet, Take 50  mg by mouth every 12 (twelve) hours., Disp: , Rfl:    triamcinolone  cream (KENALOG ) 0.1 %, Apply topically 2 (two) times daily., Disp: , Rfl:    Vitamin D , Ergocalciferol , (DRISDOL ) 1.25 MG (50000 UNIT) CAPS capsule, TAKE ONE CAPSULE BY MOUTH EVERY 14 DAYS, Disp: 6 capsule, Rfl: 1  Allergies  Allergen Reactions   Penicillins Hives and Swelling    Childhood allergy.  Has tolerated 3rd generation cephalosporin (CEFTRIAXONE ) without documented ADRs   Aspirin  Nausea Only   Ibuprofen Nausea Only   Lyrica  [Pregabalin ] Nausea Only   Acetaminophen  Nausea Only    I personally reviewed active problem list, medication list, allergies with the patient/caregiver today.   ROS  Ten systems reviewed and is negative except as mentioned in HPI    Objective Physical Exam  CONSTITUTIONAL: Patient appears well-developed and well-nourished. No distress. HEENT: Head atraumatic, normocephalic, neck supple. CARDIOVASCULAR: Normal rate, regular rhythm and normal heart sounds. No murmur heard. Non pitting edema  BLE edema. Legs are erythematous. PULMONARY: Effort normal and breath sounds normal. No respiratory distress. PSYCHIATRIC: Patient has a  normal mood and affect. Behavior is normal. Judgment and thought content normal.  Vitals:   03/25/24 1422  BP: 116/62  Pulse: 67  Resp: 18  Temp: 98.2 F (36.8 C)  SpO2: 95%  Weight: 156 lb 1.6 oz (70.8 kg)  Height: 5' 11 (1.803 m)    Body mass index is 21.77 kg/m.  Recent Results (from the past 2160 hours)  Type and screen     Status: None   Collection Time: 02/07/24  9:58 AM  Result Value Ref Range   ABO/RH(D) A POS    Antibody Screen NEG    Sample Expiration 02/21/2024,2359    Extend sample reason      NO TRANSFUSIONS OR PREGNANCY IN THE PAST 3 MONTHS Performed at [facility], [address] [place], [place] [address]   Surgical pcr screen     Status: None   Collection Time: 02/07/24  9:59 AM   Specimen: Nasal Mucosa; Nasal Swab  Result Value Ref Range   MRSA, PCR NEGATIVE NEGATIVE   Staphylococcus aureus NEGATIVE NEGATIVE    Comment: (NOTE) The Xpert SA Assay (FDA approved for NASAL specimens in patients 10 years of age and older), is one component of a comprehensive surveillance program. It is not intended to diagnose infection nor to guide or monitor treatment. Performed at [facility], [address] [place], [place] [address]   Surgical pathology     Status: None   Collection Time: 02/20/24 12:00 AM  Result Value Ref Range   SURGICAL PATHOLOGY      SURGICAL PATHOLOGY [facility] [address], [address] [place], [place] [address] Telephone [phone] or [phone] Fax [phone]  REPORT OF SURGICAL PATHOLOGY   Accession #: [id number] Patient Name: AMERIGO, MCGLORY Visit # : [id number]  MRN: [id number] Physician: Marea Selinda CANDIE Carolyne 01-Apr-1956 (Age: 46) Gender: M Collected Date: 02/20/2024 Received Date: 02/20/2024  FINAL DIAGNOSIS       1. Plaque, left common femoral, superficial femoral, profundus femoral artery :       - PORTION OF VESSEL WALL WITH CALCIFIED PLAQUE       2. Plaque, right  common femoral, superficial femoral, profundus femoral artery :       - PORTION OF VESSEL WALL WITH CALCIFIED PLAQUE       ELECTRONIC SIGNATURE : Kashikar Md, Retail buyer, Sports administrator, International aid/development worker  MICROSCOPIC DESCRIPTION  CASE COMMENTS STAINS USED IN DIAGNOSIS: H&E H&E    CLINICAL HISTORY  SPECIMEN(S) OBTAINED 1. Plaque, Left Common Femoral, Superficial Femoral, Profun dus Femoral Artery 2. Plaque, Right Common Femoral, Superficial Femoral, Profundus Femoral Artery  SPECIMEN COMMENTS: SPECIMEN CLINICAL INFORMATION: 1. ASO with claudication    Gross Description 1. Left common femoral, superficial, received fresh and placed in formalin is a 3.8 x 2.5 x 1.1 cm aggregate of partially tubular, pink-red, partially calcified (40%) tissue fragments, consistent with plaque like material.Representative sections are submitted in 1 block (1A). 2. Right common femoral, superficial, received fresh and placed in formalin is a 4.2 x 2.5 x 1.4 cm aggregate of partially tubular, pink-red, partially calcified (60%) tissue fragments, consistent with plaque like material.Representative sections are submitted in 1 block (2A).      AMG 02/20/2024        Report signed out from the following location(s) [facility] [address] [place], [place] [address] CLIA #: [id number]  [facility] [address] [place], [place] [address] CLIA #: [id number]   Glucose, capillary     Status: Abnormal   Collection Time: 02/20/24  6:27 AM  Result Value Ref Range   Glucose-Capillary 103 (H) 70 - 99 mg/dL    Comment: Glucose reference range applies only to samples taken after fasting for at least 8 hours.  ABO/Rh     Status: None   Collection Time: 02/20/24  6:39 AM  Result Value Ref Range   ABO/RH(D)      A POS Performed at [facility], [address] [place], [place] [address]   Glucose, capillary     Status: Abnormal   Collection  Time: 02/20/24 12:34 PM  Result Value Ref Range   Glucose-Capillary 133 (H) 70 - 99 mg/dL    Comment: Glucose reference range applies only to samples taken after fasting for at least 8 hours.  Glucose, capillary     Status: Abnormal   Collection Time: 02/20/24  3:04 PM  Result Value Ref Range   Glucose-Capillary 151 (H) 70 - 99 mg/dL    Comment: Glucose reference range applies only to samples taken after fasting for at least 8 hours.  Glucose, capillary     Status: Abnormal   Collection Time: 02/20/24 10:07 PM  Result Value Ref Range   Glucose-Capillary 185 (H) 70 - 99 mg/dL    Comment: Glucose reference range applies only to samples taken after fasting for at least 8 hours.  CBC     Status: Abnormal   Collection Time: 02/21/24 12:10 AM  Result Value Ref Range   WBC 8.3 4.0 - 10.5 K/uL   RBC 4.93 4.22 - 5.81 MIL/uL   Hemoglobin 14.9 13.0 - 17.0 g/dL   HCT 54.8 60.9 - 47.9 %   MCV 91.5 80.0 - 100.0 fL   MCH 30.2 26.0 - 34.0 pg   MCHC 33.0 30.0 - 36.0 g/dL   RDW 86.1 88.4 - 84.4 %   Platelets 136 (L) 150 - 400 K/uL   nRBC 0.0 0.0 - 0.2 %    Comment: Performed at [facility], [address], [place], [place] [address]  Basic metabolic panel     Status: Abnormal   Collection Time: 02/21/24 12:10 AM  Result Value Ref Range   Sodium 136 135 - 145 mmol/L   Potassium 4.0 3.5 - 5.1 mmol/L   Chloride 104 98 - 111 mmol/L   CO2 24 22 - 32  mmol/L   Glucose, Bld 184 (H) 70 - 99 mg/dL    Comment: Glucose reference range applies only to samples taken after fasting for at least 8 hours.   BUN 19 8 - 23 mg/dL   Creatinine, Ser 9.24 0.61 - 1.24 mg/dL   Calcium 8.5 (L) 8.9 - 10.3 mg/dL   GFR, Estimated >39 >39 mL/min    Comment: (NOTE) Calculated using the CKD-EPI Creatinine Equation (2021)    Anion gap 8 5 - 15    Comment: Performed at [facility], [address] [place], [place] [address]  Glucose, capillary     Status: Abnormal   Collection Time: 02/21/24 11:17 AM   Result Value Ref Range   Glucose-Capillary 150 (H) 70 - 99 mg/dL    Comment: Glucose reference range applies only to samples taken after fasting for at least 8 hours.  Digoxin  level     Status: None   Collection Time: 02/21/24  1:08 PM  Result Value Ref Range   Digoxin  Level 0.9 0.8 - 2.0 ng/mL    Comment: Performed at [facility], [address] [place], [place] [address]  Glucose, capillary     Status: Abnormal   Collection Time: 02/21/24  4:45 PM  Result Value Ref Range   Glucose-Capillary 100 (H) 70 - 99 mg/dL    Comment: Glucose reference range applies only to samples taken after fasting for at least 8 hours.  Glucose, capillary     Status: Abnormal   Collection Time: 02/21/24  7:55 PM  Result Value Ref Range   Glucose-Capillary 142 (H) 70 - 99 mg/dL    Comment: Glucose reference range applies only to samples taken after fasting for at least 8 hours.  Glucose, capillary     Status: Abnormal   Collection Time: 02/21/24  9:25 PM  Result Value Ref Range   Glucose-Capillary 130 (H) 70 - 99 mg/dL    Comment: Glucose reference range applies only to samples taken after fasting for at least 8 hours.  Glucose, capillary     Status: None   Collection Time: 02/22/24  8:12 AM  Result Value Ref Range   Glucose-Capillary 74 70 - 99 mg/dL    Comment: Glucose reference range applies only to samples taken after fasting for at least 8 hours.  Glucose, capillary     Status: Abnormal   Collection Time: 02/22/24 11:43 AM  Result Value Ref Range   Glucose-Capillary 199 (H) 70 - 99 mg/dL    Comment: Glucose reference range applies only to samples taken after fasting for at least 8 hours.  Glucose, capillary     Status: Abnormal   Collection Time: 02/22/24  4:12 PM  Result Value Ref Range   Glucose-Capillary 142 (H) 70 - 99 mg/dL    Comment: Glucose reference range applies only to samples taken after fasting for at least 8 hours.  Glucose, capillary     Status: Abnormal   Collection  Time: 02/22/24  8:54 PM  Result Value Ref Range   Glucose-Capillary 157 (H) 70 - 99 mg/dL    Comment: Glucose reference range applies only to samples taken after fasting for at least 8 hours.  Glucose, capillary     Status: Abnormal   Collection Time: 02/23/24  8:19 AM  Result Value Ref Range   Glucose-Capillary 113 (H) 70 - 99 mg/dL    Comment: Glucose reference range applies only to samples taken after fasting for at least 8 hours.  Glucose, capillary  Status: Abnormal   Collection Time: 02/23/24 11:54 AM  Result Value Ref Range   Glucose-Capillary 191 (H) 70 - 99 mg/dL    Comment: Glucose reference range applies only to samples taken after fasting for at least 8 hours.  Glucose, capillary     Status: Abnormal   Collection Time: 02/23/24  4:37 PM  Result Value Ref Range   Glucose-Capillary 129 (H) 70 - 99 mg/dL    Comment: Glucose reference range applies only to samples taken after fasting for at least 8 hours.  Glucose, capillary     Status: Abnormal   Collection Time: 02/23/24  8:17 PM  Result Value Ref Range   Glucose-Capillary 119 (H) 70 - 99 mg/dL    Comment: Glucose reference range applies only to samples taken after fasting for at least 8 hours.  Glucose, capillary     Status: Abnormal   Collection Time: 02/24/24  7:57 AM  Result Value Ref Range   Glucose-Capillary 119 (H) 70 - 99 mg/dL    Comment: Glucose reference range applies only to samples taken after fasting for at least 8 hours.  VAS US  ABI WITH/WO TBI     Status: None   Collection Time: 03/10/24 11:46 AM  Result Value Ref Range   Right ABI 1.05    Left ABI 1.18     Diabetic Foot Exam:     PHQ2/9:    03/25/2024    2:33 PM 01/01/2024    3:23 PM 12/25/2023    4:31 PM 10/04/2023    1:57 PM 07/20/2023    2:08 PM  Depression screen PHQ 2/9  Decreased Interest 0 0 0 0 0  Down, Depressed, Hopeless 0 0 0 0 0  PHQ - 2 Score 0 0 0 0 0  Altered sleeping 0   0 0  Tired, decreased energy 0   0 0  Change in  appetite 0   0 0  Feeling bad or failure about yourself  0   0 0  Trouble concentrating 0   0 0  Moving slowly or fidgety/restless 0   0 0  Suicidal thoughts 0   0 0  PHQ-9 Score 0   0 0  Difficult doing work/chores Not difficult at all   Not difficult at all     phq 9 is negative  Fall Risk:    03/25/2024    2:33 PM 01/01/2024    3:23 PM 10/04/2023    1:59 PM 07/20/2023    2:08 PM 06/21/2023   10:45 AM  Fall Risk   Falls in the past year? 0 0 0 0 0  Number falls in past yr: 0 0 0  0  Injury with Fall? 0 0 0  0  Risk for fall due to :  No Fall Risks No Fall Risks No Fall Risks   Follow up Falls evaluation completed Falls prevention discussed;Education provided;Falls evaluation completed Falls prevention discussed;Falls evaluation completed Falls prevention discussed       Assessment & Plan Peripheral arterial disease of lower extremities, post-stenting, with resolved left foot ulcer and intermittent claudication Post-stenting with improved blood flow. Pain and swelling due to refusion, managed with compression therapy. - Continue Eliquis  5 mg twice daily. - Continue pravastatin  20 mg daily. - Continue metoprolol  200 mg daily. - Use compression wraps or stockings as advised. - Elevate legs to reduce swelling. - Encourage walking to improve circulation.  Chronic atrial fibrillation and atrial flutter with dilated cardiomyopathy and heart failure with  reduced ejection fraction Chronic AFib and atrial flutter managed with medications. Occasional jerking possibly related to metoprolol . - Continue Eliquis  for anticoagulation. - Continue digoxin  0.125 mg daily. - Continue metoprolol  200 mg daily, consider discussing dosing schedule with cardiologist. - Continue losartan  as prescribed. - Continue Farxiga  for cardiomyopathy.  Type 2 diabetes mellitus with improved glycemic control and associated dyslipidemia and hypertension Improved glycemic control with A1c at 7.2%. Managed with  Tresiba , Farxiga , and Ozempic . Tresiba  availability issue addressed. - Continue Tresiba  10 units daily, adjust based on fasting glucose levels. - Send Tresiba  prescription to [facility] pharmacy for immediate pickup. - Continue Farxiga  as prescribed. - Continue Ozempic  0.5 mg weekly. - Monitor blood glucose levels regularly. - Send Tresiba  prescription to [facility] for future refills.  Chronic obstructive pulmonary disease (COPD) with ongoing tobacco use disorder Moderate COPD with ongoing tobacco use. Daily coughing present. - Discuss smoking cessation options if interested.  Chronic hepatitis C infection Chronic hepatitis C infection without current treatment.  Schizoaffective disorder Well-managed without current treatment. No symptoms or interest in psychiatric follow-up.  General Health Maintenance Pending eye exam due to leg issues. Shingles vaccination record needs confirmation. - Encourage scheduling an eye exam. - Confirm shingles vaccination record with [facility].

## 2024-03-31 DIAGNOSIS — M545 Low back pain, unspecified: Secondary | ICD-10-CM | POA: Diagnosis not present

## 2024-03-31 DIAGNOSIS — M961 Postlaminectomy syndrome, not elsewhere classified: Secondary | ICD-10-CM | POA: Diagnosis not present

## 2024-03-31 DIAGNOSIS — M5416 Radiculopathy, lumbar region: Secondary | ICD-10-CM | POA: Diagnosis not present

## 2024-03-31 DIAGNOSIS — Z716 Tobacco abuse counseling: Secondary | ICD-10-CM | POA: Diagnosis not present

## 2024-03-31 DIAGNOSIS — Z720 Tobacco use: Secondary | ICD-10-CM | POA: Diagnosis not present

## 2024-03-31 DIAGNOSIS — M5430 Sciatica, unspecified side: Secondary | ICD-10-CM | POA: Diagnosis not present

## 2024-03-31 DIAGNOSIS — F1120 Opioid dependence, uncomplicated: Secondary | ICD-10-CM | POA: Diagnosis not present

## 2024-03-31 DIAGNOSIS — F209 Schizophrenia, unspecified: Secondary | ICD-10-CM | POA: Diagnosis not present

## 2024-03-31 DIAGNOSIS — I10 Essential (primary) hypertension: Secondary | ICD-10-CM | POA: Diagnosis not present

## 2024-03-31 DIAGNOSIS — Z79891 Long term (current) use of opiate analgesic: Secondary | ICD-10-CM | POA: Diagnosis not present

## 2024-03-31 DIAGNOSIS — E119 Type 2 diabetes mellitus without complications: Secondary | ICD-10-CM | POA: Diagnosis not present

## 2024-03-31 DIAGNOSIS — G894 Chronic pain syndrome: Secondary | ICD-10-CM | POA: Diagnosis not present

## 2024-04-02 DIAGNOSIS — Z79891 Long term (current) use of opiate analgesic: Secondary | ICD-10-CM | POA: Diagnosis not present

## 2024-04-02 DIAGNOSIS — F1120 Opioid dependence, uncomplicated: Secondary | ICD-10-CM | POA: Diagnosis not present

## 2024-04-08 NOTE — Progress Notes (Addendum)
 Pharmacy Quality Measure Review  This patient is appearing on a report for being at risk of failing the adherence measure for cholesterol (statin) medications this calendar year.   Medication: pravastatin  20 mg Last fill date: 03/03/24 for a 90 day supply  Insurance report was not up to date. No action needed at this time.   Waymond Meador E. Marsh, PharmD Clinical Pharmacist [facility] [phone]

## 2024-04-23 ENCOUNTER — Other Ambulatory Visit: Payer: Self-pay

## 2024-04-23 NOTE — Patient Instructions (Signed)
 Visit Information  Thank you for taking time to visit with me today. Please don't hesitate to contact me if I can be of assistance to you before our next scheduled appointment.  Your next care management appointment is by telephone on 05/21/2024  at 2:30 pm  Telephone follow-up in 1 month  Please call the care guide team at [phone] if you need to cancel, schedule, or reschedule an appointment.   Please call the Suicide and Crisis Lifeline: 988 call the [place]  National Suicide Prevention Lifeline: 872-701-5584 or TTY: 9404027240 TTY 819-242-4654) to talk to a trained counselor call 1-800-273-TALK (toll free, 24 hour hotline) go to [facility] [address] [place] [phone]) call 911 if you are experiencing a Mental Health or Behavioral Health Crisis or need someone to talk to.  Nestora Duos, MSN, RN [place]  [facility], [facility] RN Care Manager Direct Dial: [phone] Fax: [phone]    Chronic Obstructive Pulmonary Disease Exacerbation  Chronic obstructive pulmonary disease (COPD) is a long-term (chronic) lung problem. When you have COPD, it can feel harder to breathe in or out. COPD exacerbation is a flare-up of symptoms when breathing gets worse and more treatment may be needed. Without treatment, flare-ups can be life-threatening. If they happen often, your lungs can become more damaged. What are the causes? Not taking your usual COPD medicines as told by your health care provider. A cold or the flu, which can cause infection in your lungs. Being exposed to things that make your breathing worse, such as: Smoke. Air pollution. Fumes. Dust. Allergies. Weather changes. What are the signs or symptoms? Symptoms do not get better or get worse even if you take your medicines as told by your provider. Symptoms may include: More shortness of breath. You may only be able to speak one or two  words at a time. More coughing or mucus from your lungs. More wheezing or chest tightness. Being more tired and having less energy. Confusion. How is this diagnosed? This condition is diagnosed based on: Symptoms that get worse. Your medical history. A physical exam. You may also have tests, including: A chest X-ray. Blood or mucus tests. How is this treated? You may be able to stay home or you may need to go to the hospital. Treatment may include: Taking medicines. These may include: Inhalers. These have medicines in them that you breathe in. These may be more of what you already take or they may be new. Steroids. These reduce inflammation in the airways. These may be inhaled, taken by mouth, or given in an IV. Antibiotics. These treat infection. Using oxygen. Using a device to help you clear mucus. Follow these instructions at home: Medicines Take your medicines only as told by your provider. If you were given antibiotics or steroids, take them as told by your provider. Do not stop taking them even if you start to feel better. Lifestyle Several times a day, wash your hands with soap and water for at least 20 seconds. If you cannot use soap and water, use hand sanitizer. This may help keep you from getting an infection. Avoid being around crowds or people who are sick. Do not smoke or use any products that contain nicotine or tobacco. If you need help quitting, ask your provider. Return to your normal activities when your provider says that it's safe. Use breathing methods to control your stress and catch your breath. How is this prevented? Follow your COPD action plan. The action  plan tells you what to do if you're feeling good and what to do when you start feeling worse. Discuss the plan often with your provider. Make sure you get all the shots, also called vaccines, that your provider recommends. Ask your provider about a flu shot and a pneumonia shot. Use oxygen therapy if  told by your provider. If you need home oxygen therapy, ask your provider how often to check your oxygen level with a device called an oximeter. Keep all follow-up visits to review your COPD action plan. Your provider will want to check on your condition often to keep you healthy and out of the hospital. Contact a health care provider if: Your COPD symptoms get worse. You have a fever or chills. You have trouble doing daily activities. You have trouble breathing even when you are resting. Get help right away if: You are short of breath and cannot: Talk in full sentences. Do normal activities. You have chest pain. You feel confused. These symptoms may be an emergency. Call 911 right away. Do not wait to see if the symptoms will go away. Do not drive yourself to the hospital. This information is not intended to replace advice given to you by your health care provider. Make sure you discuss any questions you have with your health care provider. Document Revised: 05/17/2023 Document Reviewed: 10/30/2022 Elsevier Patient Education  2024 Elsevier Inc.Type 2 Diabetes Mellitus, Self-Care, Adult When you have type 2 diabetes (type 2 diabetes mellitus), you must make sure your blood sugar (glucose) stays in a healthy range. You can do this with: Nutrition. Exercise. Lifestyle changes. Medicines or insulin , if needed. Support from your doctors and others. What are the risks? Having type 2 diabetes can raise your risk for other long-term (chronic) health problems. You may get medicines to help prevent these problems. How to stay aware of your blood sugar  Check your blood sugar level every day, as often as told. Have your A1C (hemoglobin A1C) level checked two or more times a year. Have it checked more often if told. Your doctor will set personal treatment goals for you. In general, you should have these blood sugar levels: Before meals: 80-130 mg/dL (4.4-7.2 mmol/L). After meals: below 180  mg/dL (10 mmol/L). A1C: less than 7%. How to manage high and low blood sugar Symptoms of high blood sugar High blood sugar is also called hyperglycemia. Know the symptoms of high blood sugar. These may include: More thirst. Hunger. Feeling very tired. Needing to pee (urinate) more often than normal. Seeing things blurry. Symptoms of low blood sugar Low blood sugar is also called hypoglycemia. This is when blood sugar is at or below 70 mg/dL (3.9 mmol/L). Symptoms may include: Hunger. Feeling worried or nervous (anxious). Feeling sweaty and cold to the touch (clammy). Being dizzy or light-headed. Feeling sleepy. A fast heartbeat. Feeling grouchy (irritable). Tingling or loss of feeling (numbness) around your mouth, lips, or tongue. Restless sleep. Diabetes medicines can cause low blood sugar. You are more at risk: While you exercise. After exercise. During sleep. When you are sick. When you skip meals or do not eat for a long time. Treating low blood sugar If you think you have low blood sugar, eat or drink something sugary right away. Keep 15 grams of a fast-acting carb (carbohydrate) with you all the time. Make sure your family and friends know how to treat you if you cannot treat yourself. Treating very low blood sugar Severe hypoglycemia is when your blood sugar  is at or below 54 mg/dL (3 mmol/L). Severe hypoglycemia is an emergency. Get medical help right away. Call your local emergency services (911 in the [place] Do not wait to see if the symptoms will go away. Do not drive yourself to the hospital. You may need a glucagon shot if you have very low blood sugar and you cannot eat or drink. Have a family member or friend learn how to check your blood sugar and how to give you a glucagon shot. Ask your doctor if you should have a kit for glucagon shots. Follow these instructions at home: Medicines Take prescribed insulin  or diabetes medicines as told by your health care  provider. Do not run out of insulin  or other medicines. Plan ahead. If you use insulin , change the amount you take based on how active you are and what foods you eat. Your doctor will tell you how to do this. Take over-the-counter and prescription medicines only as told by your doctor. Eating and drinking  Eat healthy foods. These include: Low-fat (lean) proteins. Complex carbs, such as whole grains. Fresh fruits and vegetables. Low-fat dairy products. Healthy fats. Meet with a food expert (dietitian) to make an eating plan. Follow instructions from your doctor about what you cannot eat or drink. Drink enough fluid to keep your pee (urine) pale yellow. Keep track of carbs that you eat. Read food labels and learn serving sizes of foods. Follow your sick-day plan when you cannot eat or drink as normal. Make this plan with your doctor so it is ready to use. Activity Exercise as told by your doctor. You may need to: Do stretching and strength exercises two or more times a week. Do 150 minutes or more of exercise each week that makes your heart beat faster and makes you sweat. Spread out your exercise over 3 or more days a week. Do not go more than 2 days in a row without exercise. Talk with your doctor before you start a new exercise. Your doctor may tell you to change: How much insulin  or medicines you take. How much food you eat. Lifestyle Do not smoke or use any products that contain nicotine or tobacco. If you need help quitting, ask your doctor. If you drink alcohol and your doctor says that it is safe for you: Limit how much you have to: 0-1 drink a day for women who are not pregnant. 0-2 drinks a day for men. Know how much alcohol is in your drink. In the [place] one drink equals one 12 oz bottle of beer (355 mL), one 5 oz glass of wine (148 mL), or one 1 oz glass of hard liquor (44 mL). Learn to deal with stress. If you need help, ask your doctor. Body care  Stay up to date  with your shots (immunizations). Have your eyes and feet checked by a doctor as often as told. Check your skin and feet every day. Check for cuts, bruises, redness, blisters, or sores. Brush your teeth and gums two times a day. Floss one or more times a day. Go to the dentist one or more times every 6 months. Stay at a healthy weight. General instructions Share your diabetes care plan with: Your work or school. People you live with. Carry a card or wear jewelry that says you have diabetes. Keep all follow-up visits. Questions to ask your doctor Do I need to meet with a certified expert in diabetes education and care? Where can I find a support group? Where  to find more information For help and guidance and more information about diabetes, please go to: American Diabetes Association: www.diabetes.org American Association of Diabetes Care and Education Specialists: www.diabeteseducator.org International Diabetes Federation: DCOnly.dk Summary When you have type 2 diabetes, you must make sure your blood sugar (glucose) stays in a healthy range. You can do this with nutrition, exercise, medicines and insulin , and support from doctors and others. Check your blood sugar every day, or as often as told. Having diabetes can raise your risk for other long-term health problems. You may get medicines to help prevent these problems. Share your diabetes management plan with people at work, school, and home. Keep all follow-up visits. This information is not intended to replace advice given to you by your health care provider. Make sure you discuss any questions you have with your health care provider. Document Revised: 11/08/2020 Document Reviewed: 11/08/2020 Elsevier Patient Education  2024 Elsevier Inc.  Atherosclerosis  Atherosclerosis is when plaque builds up in the arteries. This causes narrowing and hardening of the arteries. Arteries are blood vessels that carry blood from the heart to all  parts of the body. This blood contains oxygen. Plaque occurs due to inflammation or from a buildup of fat, cholesterol, calcium, waste products of cells, and a clotting material in the blood (fibrin). Plaque decreases the amount of blood that can flow through the artery. Atherosclerosis can affect any artery in your body, including: Heart arteries. Damage to these arteries may lead to coronary artery disease, which can cause a heart attack. Brain arteries. Damage to these arteries may cause a stroke. Leg, arm, and pelvis arteries. Peripheral artery disease (PAD) may result from damage to these arteries. Kidney arteries. Kidney (renal) failure may result from damage to kidney arteries. Treatment may slow the disease and prevent further damage to your heart, brain, peripheral arteries, and kidneys. What are the causes? This condition develops slowly over many years. The inner layers of your arteries become damaged and allow the gradual buildup of plaque. The exact cause of atherosclerosis is not fully understood. Symptoms of atherosclerosis do not occur until an artery becomes narrow or blocked. What increases the risk? The following factors may make you more likely to develop this condition: Being middle-aged or older. Certain medical conditions, including: High blood pressure. High cholesterol. High blood fats (triglycerides). Diabetes. Sleep apnea. Obesity. Certain lab levels, including: Elevated C-reactive protein (CRP). This is a sign of increased inflammation in your body. Elevated homocysteine levels. This is an amino acid that is associated with heart and blood vessel disease. Using tobacco or nicotine products. A family history of atherosclerosis. Not exercising enough (sedentary lifestyle). Being stressed. Drinking too much alcohol or using drugs, such as cocaine or methamphetamine. What are the signs or symptoms? Symptoms of atherosclerosis do not occur until the plaque severely  narrows or blocks the artery, which decreases blood flow. Sometimes, atherosclerosis does not cause symptoms. Symptoms of this condition include: Coronary artery disease. This may cause chest pain and shortness of breath. Decreased blood supply to your brain, which may cause a stroke. Signs of a stroke may include sudden: Weakness or numbness in your face, arm, or leg, especially on one side of your body. Trouble walking or difficulty moving your arms or legs. Loss of balance or coordination. Confusion. Slurred speech. Trouble speaking, or trouble understanding speech, or both (aphasia). Vision changes in one or both eyes. This may be double vision, blurred vision, or loss of vision. Severe headache with no known cause.  The headache is often described as the worst headache ever experienced. PAD, which may cause pain, numbness, or nonhealing wounds, often in your legs and hips. Renal failure. This may cause tiredness, problems with urination, swelling, and itchy skin. How is this diagnosed? This condition is diagnosed based on your medical history and a physical exam. During the exam, your health care provider will: Check your pulse in different places. Listen for a whooshing sound over your arteries (bruit). You may also have tests, such as: Blood tests to check your levels of cholesterol, triglycerides, blood sugar, and CRP. Ankle-brachial index to compare blood pressure in your arms to blood pressure in your ankles to see how your blood is flowing. Heart (cardiac) tests. Electrocardiogram (ECG) to check for heart damage. Stress test to see how your heart reacts to exercise. Ultrasound tests. Ultrasound of your peripheral arteries to check blood flow. Echocardiogram to get images of your heart's chambers and valves. X-ray tests. Chest X-ray to see if you have an enlarged heart, which is a sign of heart failure. CT scan to check for damage to your heart, brain, or arteries. Angiogram.  This is a test where dye is injected and X-rays are used to see the blood flow in the arteries. How is this treated? This condition is treated with lifestyle changes as the first step. These may include: Changing your diet. Losing weight. Reducing stress. Exercising and being physically active more regularly. Quitting smoking. You may also need medicine to: Lower triglycerides and cholesterol. Control blood pressure. Prevent blood clots. Lower inflammation in your body. Control your blood sugar. Sometimes, surgery is needed to: Remove plaque from an artery (endarterectomy). Open or widen a narrowed heart artery or peripheral artery (angioplasty). Create a new path for your blood with one of these procedures: Heart (coronary) artery bypass graft surgery. Peripheral artery bypass graft surgery. Place a small mesh tube (stent) in an artery to open or widen a narrowed artery. Follow these instructions at home: Eating and drinking  Eat a heart-healthy diet. Talk with your health care provider or a dietitian if you need help. A heart-healthy diet involves: Limiting unhealthy fats and increasing healthy fats. Some examples of healthy fats are avocados and olive oil. Eating plant-based foods, such as fruits, vegetables, nuts, whole grains, and legumes (such as peas and lentils). If you drink alcohol: Limit how much you have to: 0-1 drink a day for women who are not pregnant. 0-2 drinks a day for men. Know how much alcohol is in a drink. In the [place] one drink equals one 12 oz bottle of beer (355 mL), one 5 oz glass of wine (148 mL), or one 1 oz glass of hard liquor (44 mL). Lifestyle  Maintain a healthy weight. Lose weight if your health care provider says that you need to do that. Follow an exercise program as told by your health care provider. Do not use any products that contain nicotine or tobacco. These products include cigarettes, chewing tobacco, and vaping devices, such as  e-cigarettes. If you need help quitting, ask your health care provider. Do not use drugs. General instructions Take over-the-counter and prescription medicines only as told by your health care provider. Manage other health conditions as told. Keep all follow-up visits. This is important. Contact a health care provider if you have: An irregular heartbeat. Unexplained tiredness (fatigue). Trouble urinating, or you are producing less urine or foamy urine. Swelling of your hands or feet, or itchy skin. Unexplained pain or numbness in  your legs or hips. A wound that is slow to heal or is not healing. Get help right away if: You have any symptoms of a heart attack. These may be: Chest pain. This includes squeezing chest pain that may feel like indigestion (angina). Shortness of breath. Pain in your neck, jaw, arms, back, or stomach. Cold sweat. Nausea. Light-headedness. Sudden pain, numbness, or coldness in a limb. You have any symptoms of a stroke. BE FAST is an easy way to remember the main warning signs of a stroke: B - Balance. Signs are dizziness, sudden trouble walking, or loss of balance. E - Eyes. Signs are trouble seeing or a sudden change in vision. F - Face. Signs are sudden weakness or numbness of the face, or the face or eyelid drooping on one side. A - Arms. Signs are weakness or numbness in an arm. This happens suddenly and usually on one side of the body. S - Speech. Signs are sudden trouble speaking, slurred speech, or trouble understanding what people say. T - Time. Time to call emergency services. Write down what time symptoms started. You have other signs of a stroke, such as: A sudden, severe headache with no known cause. Nausea or vomiting. Seizure. These symptoms may represent a serious problem that is an emergency. Do not wait to see if the symptoms will go away. Get medical help right away. Call your local emergency services (911 in the [place] Do not drive  yourself to the hospital. Summary Atherosclerosis is when plaque builds up in the arteries and causes narrowing and hardening of the arteries. Plaque occurs due to inflammation or from a buildup of fat, cholesterol, calcium, cellular waste products, and fibrin. This condition may not cause any symptoms. Symptoms of atherosclerosis do not occur until the plaque severely narrows or blocks the artery. Treatment starts with lifestyle changes and may include medicines. In some cases, surgery is needed. Get help right away if you have any symptoms of a heart attack or stroke. This information is not intended to replace advice given to you by your health care provider. Make sure you discuss any questions you have with your health care provider. Document Revised: 11/17/2020 Document Reviewed: 11/17/2020 Elsevier Patient Education  2024 [employer].

## 2024-04-23 NOTE — Patient Outreach (Signed)
 Complex Care Management   Visit Note  04/23/2024  Name:  Mark Nash MRN: [id number] DOB: Oct 25, 1955  Situation: Referral received for Complex Care Management related to COPD, Diabetes with Complications, and PAD I obtained verbal consent from Patient.  Visit completed with Patient  on the phone  Background:   Past Medical History:  Diagnosis Date   Aortic atherosclerosis (HCC)    Aortic root dilatation (HCC)    Arthritis    Asthma    Atrial fibrillation and flutter (HCC)    a.) CHA2DS2VASc = 5 (age, HFrEF, HTN, vascular disease, T2DM) as of 02/19/2024; b.) s/p DCCV 05/20/2021 --> 150 J x 1 and 200 J x 3 (converted to NSR after 1st 200 J x 1 min with ERAF); c.) rate/rhythm maintained on oral digoxin  + metoprolol  succinate; chronically anticoagulated with apixaban    Calcaneal spur    Colon polyps    Constipation    COPD (chronic obstructive pulmonary disease) (HCC)    Coronary artery disease    DDD (degenerative disc disease), lumbar    a.) s/p L3-L5 laminectomy/decompression in 2017   Diverticulosis    Dyspnea    GERD (gastroesophageal reflux disease)    Hepatitis C    a.) has not been Tx'd; b.) endorses IVDU ages 18-26   HFrEF (heart failure with reduced ejection fraction) (HCC)    History of intravenous drug abuse    a.) age 51-25   History of kidney stones    History of right cataract extraction    Hyperlipidemia    Hypertension 06/10/2018   Left inguinal hernia    NICM (nonischemic cardiomyopathy) (HCC)    On apixaban  therapy    Peripheral vascular disease (HCC)    Pulmonary hypertension (HCC)    Restless legs    Schizoaffective disorder (HCC)    a.) refuses to consult with psychiatry per PCP   Status post bilateral inguinal hernia repair    T2DM (type 2 diabetes mellitus) (HCC)    Thrombocytopenia (HCC)    Vitamin D  deficiency    Wears full dentures     Assessment: Patient Reported Symptoms:  Cognitive Cognitive Status: No symptoms  reported Cognitive/Intellectual Conditions Management [RPT]: None reported or documented in medical history or problem list      Neurological Neurological Review of Symptoms: No symptoms reported    HEENT HEENT Symptoms Reported: No symptoms reported HEENT Management Strategies: Routine screening HEENT Comment: needs to reschedule cataract surgery    Cardiovascular Other Cardiovascular Symptoms: stents in both legs 02/20/24 - no longer with swelling, incision healed, pain in legs 6/10, before surgery was about same, goes to pain clinic, able to ambulate without assitive devices Does patient have uncontrolled Hypertension?: No Cardiovascular Management Strategies: Routine screening, Medication therapy, Diet modification Cardiovascular Comment: no symptoms of heart failure, elevated legs prn,  Respiratory Respiratory Symptoms Reported: Dry cough, Shortness of breath Other Respiratory Symptoms: SOB not as bad as before - occasional cough, productive for clear mucous - smokes 1pk - 1.5 pk day Additional Respiratory Details: discussed sx to call PCP to avoid exacerbation Respiratory Management Strategies: Routine screening, Medication therapy  Endocrine List most recent blood sugar readings, include date and time of day: denies sx of low or high BG, has not been checking BG since before surgery agreed to start checking daily and recording to bring to PCP Endocrine Self-Management Outcome: 3 (uncertain) Endocrine Comment: no issues with nausea related to ozempic   Gastrointestinal Gastrointestinal Symptoms Reported: Nausea Additional Gastrointestinal Details: nausea resolving Gastrointestinal Management  Strategies: Medication therapy    Genitourinary Genitourinary Symptoms Reported: No symptoms reported    Integumentary Integumentary Symptoms Reported: No symptoms reported Skin Management Strategies: Routine screening  Musculoskeletal Musculoskelatal Symptoms Reviewed: Muscle pain, Difficulty  walking Additional Musculoskeletal Details: vascular leg pain - affects gait at times Musculoskeletal Management Strategies: Routine screening, Medication therapy Falls in the past year?: No Number of falls in past year: 1 or less Was there an injury with Fall?: No Fall Risk Category Calculator: 0 Patient Fall Risk Level: Low Fall Risk Patient at Risk for Falls Due to: Impaired balance/gait Fall risk Follow up: Falls evaluation completed, Falls prevention discussed (Anticoagulent - ED for head bumps)  Psychosocial Psychosocial Symptoms Reported: No symptoms reported     Quality of Family Relationships: supportive Do you feel physically threatened by others?: No    04/23/2024    PHQ2-9 Depression Screening   Little interest or pleasure in doing things Not at all  Feeling down, depressed, or hopeless Not at all  PHQ-2 - Total Score 0  Trouble falling or staying asleep, or sleeping too much    Feeling tired or having little energy    Poor appetite or overeating     Feeling bad about yourself - or that you are a failure or have let yourself or your family down    Trouble concentrating on things, such as reading the newspaper or watching television    Moving or speaking so slowly that other people could have noticed.  Or the opposite - being so fidgety or restless that you have been moving around a lot more than usual    Thoughts that you would be better off dead, or hurting yourself in some way    PHQ2-9 Total Score    If you checked off any problems, how difficult have these problems made it for you to do your work, take care of things at home, or get along with other people    Depression Interventions/Treatment      There were no vitals filed for this visit.  Medications Reviewed Today     Reviewed by Devra Lands, RN (Registered Nurse) on 04/23/24 at 1517  Med List Status: <None>   Medication Order Taking? Sig Documenting Provider Last Dose Status Informant  apixaban   (ELIQUIS ) 5 MG TABS tablet [id number] Yes Take 1 tablet by mouth twice daily Gollan, Timothy J, MD  Active   blood glucose meter kit and supplies KIT [id number] Yes Dispense based on patient and insurance preference. Use up to tid E11.65, LON:99 Sowles, Krichna, MD  Active Self  Blood Glucose Monitoring Suppl (ONETOUCH VERIO FLEX SYSTEM) w/Device KIT [id number] Yes Use to check blood sugar up to four times daily as directed Sowles, Krichna, MD  Active   Budeson-Glycopyrrol-Formoterol  (BREZTRI  AEROSPHERE) 160-9-4.8 MCG/ACT AERO [id number] Yes Inhale 2 puffs into the lungs 2 (two) times daily. Patient receives via [employer] Patient Assistance. Sowles, Krichna, MD  Active Self  dapagliflozin  propanediol (FARXIGA ) 10 MG TABS tablet [id number] Yes Take 1 tablet (10 mg total) by mouth daily. Sowles, Krichna, MD  Active Self  digoxin  (LANOXIN ) 0.125 MG tablet [id number] Yes Take 0.5 tablets (0.0625 mg total) by mouth daily. Abigail Bernardino HERO, PA-C  Active   gabapentin  (NEURONTIN ) 300 MG capsule [id number] Yes Take 300-600 mg by mouth See admin instructions. Take 1 capsule (300 mg) by mouth in the morning, take 1 capsule (300 mg) by mouth at noon, and 2 capsules (600 mg) by mouth in the evening [provider]  Active Self  glucose blood (ONETOUCH VERIO) test strip [id number] Yes Use to check blood sugar up to four times daily as directed Sowles, Krichna, MD  Active   insulin  degludec (TRESIBA ) 100 UNIT/ML FlexTouch Pen [id number] Yes Inject 10-50 Units into the skin daily.  Patient taking differently: Inject 10-50 Units into the skin daily. Patient taking 10 units daily   Sowles, Krichna, MD  Active   Lancet Devices MISC [id number]  1 each by Does not apply route 3 (three) times daily as needed. What ins will cover dx:E11.65, LON:99 tid Bernardo Fend, DO  Active Self  Lancets [facility] CATHRYNE PLUS [place]orf) MISC [id number] Yes Use to check blood sugar up to four times daily as directed Sowles, Krichna, MD  Active    losartan  (COZAAR ) 25 MG tablet [id number] Yes TAKE 1/2 (ONE-HALF) TABLET BY MOUTH ONCE DAILY SKIP  THE  DOSE  IF  SYSTOLIC  BP  LESS  THAN  110  MMHG Chad, Katlyn D, NP  Active   metoprolol  (TOPROL -XL) 200 MG 24 hr tablet [id number] Yes Take 1 tablet (200 mg total) by mouth daily. Take with or immediately following a meal. Dunn, Bernardino HERO, PA-C  Active   omeprazole  (PRILOSEC) 20 MG capsule [id number] Yes Take 20 mg by mouth 3 (three) times daily. [provider]  Active   oxyCODONE  (ROXICODONE ) 5 MG immediate release tablet [id number]  Take 1 tablet (5 mg total) by mouth every 6 (six) hours as needed.  Patient not taking: Reported on 04/23/2024   Brown, Fallon E, NP  Active   pravastatin  (PRAVACHOL ) 20 MG tablet [id number] Yes Take 1 tablet (20 mg total) by mouth daily with supper. Sowles, Krichna, MD  Active   Semaglutide ,0.25 or 0.5MG /DOS, (OZEMPIC , 0.25 OR 0.5 MG/DOSE,) 2 MG/1.5ML SOPN [id number] Yes Inject 0.25 mg into the skin once a week.  Patient taking differently: Inject 0.5 mg into the skin once a week.   Sowles, Krichna, MD  Active   tiZANidine  (ZANAFLEX ) 4 MG tablet [id number] Yes Take 4 mg by mouth every 12 (twelve) hours. Fernand Elfreda LABOR, MD  Active Self           Med Note NORVEL KIRSCH   Wed Feb 07, 2023  2:38 PM)    traMADol  (ULTRAM ) 50 MG tablet [id number] Yes Take 50 mg by mouth every 12 (twelve) hours. Fernand Elfreda LABOR, MD  Active Self  triamcinolone  cream (KENALOG ) 0.1 % [id number]  Apply topically 2 (two) times daily.  Patient not taking: Reported on 04/23/2024   [provider]  Active   Vitamin D , Ergocalciferol , (DRISDOL ) 1.25 MG (50000 UNIT) CAPS capsule [id number] Yes TAKE ONE CAPSULE BY MOUTH EVERY 14 DAYS Sowles, Krichna, MD  Active   Med List Note Scott Norris, RN 10/03/18 1438): UDS 10/03/18            Recommendation:   PCP Follow-up Continue Current Plan of Care  Follow Up Plan:   Telephone follow-up in 1 month  Nestora Duos, MSN, RN [facility]  [facility]  [facility] RN Care Manager Direct Dial: [phone] Fax: [phone]

## 2024-04-29 ENCOUNTER — Telehealth: Payer: Self-pay

## 2024-04-29 NOTE — Telephone Encounter (Signed)
 Received reorder form from [employer] request on Ozempic , fill and faxed to provider office to sign and date can be fax to [employer] or fax back to [phone].

## 2024-05-01 NOTE — Telephone Encounter (Signed)
 Received provider portion for [employer] Ozempic  back from provider today faxed it to [employer] today.

## 2024-05-13 ENCOUNTER — Other Ambulatory Visit: Payer: Self-pay | Admitting: Physician Assistant

## 2024-05-13 ENCOUNTER — Other Ambulatory Visit: Payer: Self-pay | Admitting: Family Medicine

## 2024-05-13 NOTE — Telephone Encounter (Signed)
 Copied from CRM [id number]. Topic: Clinical - Medication Refill >> May 13, 2024  9:40 AM Rosaria BRAVO wrote: Medication: omeprazole  (PRILOSEC) 20 MG capsule  Has the patient contacted their pharmacy? Yes (Agent: If no, request that the patient contact the pharmacy for the refill. If patient does not wish to contact the pharmacy document the reason why and proceed with request.) (Agent: If yes, when and what did the pharmacy advise?)  This is the patient's preferred pharmacy:  [facility] [address] [place] - [address] [place] [address] Phone: [phone] Fax: [phone]  Is this the correct pharmacy for this prescription? Yes If no, delete pharmacy and type the correct one.   Has the prescription been filled recently? Yes  Is the patient out of the medication? Yes  Has the patient been seen for an appointment in the last year OR does the patient have an upcoming appointment? Yes  Can we respond through MyChart? Yes  Agent: Please be advised that Rx refills may take up to 3 business days. We ask that you follow-up with your pharmacy.

## 2024-05-14 ENCOUNTER — Other Ambulatory Visit: Admitting: Pharmacist

## 2024-05-14 MED ORDER — OMEPRAZOLE 20 MG PO CPDR: 20.0000 mg | DELAYED_RELEASE_CAPSULE | Freq: Three times a day (TID) | ORAL | 0 refills | Status: DC

## 2024-05-14 NOTE — Telephone Encounter (Signed)
 Requested medication (s) are due for refill today - unknown  Requested medication (s) are on the active medication list -yes  Future visit scheduled -no  Last refill: unknown  Notes to clinic: listed as historical provider - sent for review   Requested Prescriptions  Pending Prescriptions Disp Refills   omeprazole  (PRILOSEC) 20 MG capsule      Sig: Take 1 capsule (20 mg total) by mouth 3 (three) times daily.     Gastroenterology: Proton Pump Inhibitors Passed - 05/14/2024  1:59 PM      Passed - Valid encounter within last 12 months    Recent Outpatient Visits           1 month ago Type 2 diabetes mellitus with diabetic polyneuropathy, with long-term current use of insulin  [facility])   [place] [facility] Ramos, Dorette, MD   4 months ago Type 2 diabetes mellitus with diabetic polyneuropathy, with long-term current use of insulin  [facility])   [place] [facility] [facility] Winnie, Dorette, MD   5 months ago Type 2 diabetes mellitus with diabetic polyneuropathy, with long-term current use of insulin  [facility])   [place] [facility] Glenard Dorette, MD       Future Appointments             In 1 month Dunn, Bernardino HERO, PA-C [facility]               Requested Prescriptions  Pending Prescriptions Disp Refills   omeprazole  (PRILOSEC) 20 MG capsule      Sig: Take 1 capsule (20 mg total) by mouth 3 (three) times daily.     Gastroenterology: Proton Pump Inhibitors Passed - 05/14/2024  1:59 PM      Passed - Valid encounter within last 12 months    Recent Outpatient Visits           1 month ago Type 2 diabetes mellitus with diabetic polyneuropathy, with long-term current use of insulin  [facility])   [place] [facility] Glenard Dorette, MD   4 months ago Type 2 diabetes mellitus with diabetic polyneuropathy, with long-term current use of insulin  [facility])   [facility] Glenard Dorette,  MD   5 months ago Type 2 diabetes mellitus with diabetic polyneuropathy, with long-term current use of insulin  [facility])   [place] [facility] Glenard Dorette, MD       Future Appointments             In 1 month Dunn, Bernardino HERO, PA-C [facility]

## 2024-05-16 ENCOUNTER — Other Ambulatory Visit: Payer: Self-pay

## 2024-05-16 NOTE — Progress Notes (Unsigned)
 S:     No chief complaint on file.   Reason for visit: ?  Mark Nash is a 68 y.o. male with a history of diabetes (type 2), who presents today for a follow up diabetes pharmacotherapy visit.? Pertinent PMH also includes HTN, tobacco use disorder, asthma, COPD, Afib, HFimpEF, CAD, PAD, and HLD.  Known DM Complications: {DM complications:33329}   Care Team: Primary Care Provider: Sowles, Krichna, MD  At last visit with clinical pharmacist on 01/30/24, patient was instructed to begin monitoring BP at home and bringing a log to follow up appointments.  At last visit, ***.   Patient reports Diabetes was diagnosed in ***.   Current diabetes medications include: Farxiga  10 mg daily, Ozempic  0.5 mg weekly, Tresiba  10 units daily Previous diabetes medications include: pioglitazone  (CHF), Levemir  Current hyperlipidemia medications include: pravastatin  20 mg daily Current hypertension medications include: losartan  12.5 mg daily, Toprol  XL 200 mg daily  Other: digoxin  0.0625 mg daily   Patient reports adherence to taking all medications as prescribed.  *** Patient denies adherence with medications, reports missing *** medications *** times per week, on average.  Have you been experiencing any side effects to the medications prescribed? {YES NO:22349} Do you have any problems obtaining medications due to transportation or finances? {YES [id number] Insurance coverage: ***  Patient {Actions; denies-reports:120008} hypoglycemic events.  Reported home fasting blood sugars: ***  Reported 2 hour post-meal/random blood sugars: ***.  Patient {Actions; denies-reports:120008} nocturia (nighttime urination).  Patient {Actions; denies-reports:120008} neuropathy (nerve pain). Patient {Actions; denies-reports:120008} visual changes. Patient {Actions; denies-reports:120008} self foot exams.   Patient reported dietary habits: Eats *** meals/day Breakfast: *** Lunch: *** Dinner: *** Snacks:  *** Drinks: ***  Patient-reported exercise habits: ***  HypertensionHFimpEF:  Current medications: *** Medications previously tried:   Patient {HAS/DOES NOT YJCZ:65250} a validated, automated, upper arm home BP cuff Current blood pressure readings readings: ***  Patient {Actions; denies-reports:120008} hypotensive s/sx including ***dizziness, lightheadedness.  Patient {Actions; denies-reports:120008} hypertensive symptoms including ***headache, chest pain, shortness of breath  Of note, difficulty with titration of GDMT d/t relative hypotension.   DM Prevention:  Statin: Taking; low intensity.?  History of albuminuria? yes, last UACR on 11/19/23 = 22 mg/g Last eye exam: ***; {lzdmeyeexam:31121} Lab Results  Component Value Date   HMDIABEYEEXA No Retinopathy 10/24/2023   Tobacco Use:  Tobacco Use: High Risk (03/25/2024)   Patient History    Smoking Tobacco Use: Every Day    Smokeless Tobacco Use: Never    Passive Exposure: Not on file   O:   Vitals:  Wt Readings from Last 3 Encounters:  03/25/24 156 lb 1.6 oz (70.8 kg)  03/18/24 154 lb (69.9 kg)  03/13/24 154 lb (69.9 kg)   BP Readings from Last 3 Encounters:  03/25/24 116/62  03/18/24 117/69  03/13/24 115/69   Pulse Readings from Last 3 Encounters:  03/25/24 67  03/18/24 (!) 53  03/13/24 62     Labs:?  Lab Results  Component Value Date   HGBA1C 7.2 (A) 03/25/2024   HGBA1C 10.4 (A) 11/19/2023   HGBA1C 10.5 (A) 07/20/2023   GLUCOSE 184 (H) 02/21/2024   MICRALBCREAT 22 11/19/2023   MICRALBCREAT 84 (H) 11/15/2022   MICRALBCREAT 16 11/11/2021   CREATININE 0.75 02/21/2024   CREATININE 1.00 12/24/2023   CREATININE 0.95 11/19/2023    Lab Results  Component Value Date   CHOL 116 11/19/2023   LDLCALC 42 11/19/2023   LDLCALC 29 11/15/2022   LDLCALC 43 11/11/2021  HDL 58 11/19/2023   TRIG 84 11/19/2023   TRIG 46 11/15/2022   TRIG 46 11/11/2021   ALT 17 11/19/2023   ALT 34 02/07/2023   AST 17  11/19/2023   AST 26 02/07/2023      Chemistry      Component Value Date/Time   NA 136 02/21/2024 0010   NA 139 12/24/2023 1559   K 4.0 02/21/2024 0010   CL 104 02/21/2024 0010   CO2 24 02/21/2024 0010   BUN 19 02/21/2024 0010   BUN 14 12/24/2023 1559   CREATININE 0.75 02/21/2024 0010   CREATININE 0.95 11/19/2023 1453      Component Value Date/Time   CALCIUM 8.5 (L) 02/21/2024 0010   ALKPHOS 87 01/22/2023 0615   AST 17 11/19/2023 1453   ALT 17 11/19/2023 1453   BILITOT 1.3 (H) 11/19/2023 1453   BILITOT 1.0 11/10/2015 1045       The ASCVD Risk score (Arnett DK, et al., 2019) failed to calculate for the following reasons:   The valid total cholesterol range is 130 to 320 mg/dL  Lab Results  Component Value Date   MICRALBCREAT 22 11/19/2023   MICRALBCREAT 84 (H) 11/15/2022   MICRALBCREAT 16 11/11/2021   MICRALBCREAT 4 09/15/2020   MICRALBCREAT 4 03/10/2019    A/P: Diabetes currently controlled with a most recent A1c of 7.2% on 03/25/24. Goal <8% given age and comorbidities. Patient is *** able to verbalize appropriate hypoglycemia management plan. Medication adherence appears ***. Control is suboptimal due to ***. -{Meds adjust:18428} basal insulin  {basal insulins:33573}  *** units daily.  -{Meds adjust:18428} rapid insulin  {bolus insulin :33574} ***.  -{Meds adjust:18428} GLP-1 {GLP1 options:33572} *** mg .  -{Meds adjust:18428} SGLT2-I {SGLT2i options:33571}*** mg daily.  -{Meds adjust:18428} metformin ***.  -Patient educated on purpose, proper use, and potential adverse effects of ***.  -Extensively discussed pathophysiology of diabetes, recommended lifestyle interventions, dietary effects on blood sugar control.  -Counseled on s/sx of and management of hypoglycemia.  -Next A1c anticipated ***.   ASCVD risk - {primary/secondary:33575} prevention in patient with diabetes. Last LDL is *** mg/dL, not at goal of <29 *** mg/dL. ASCVD risk factors include *** and 10-year  ASCVD risk score of ***. {Desc; low/moderate/high:110033} intensity statin indicated.  -{Meds adjust:18428} {statin therapies:33576} *** mg daily.   Hypertension longstanding *** currently ***. Blood pressure goal of <130/80 *** mmHg. Medication adherence ***. Blood pressure control is suboptimal due to ***. -{Meds adjust:18428} *** mg ***.  {pharmacisttime:33368}  Follow-up:  Pharmacist on *** PCP clinic visit on ***  [name], PharmD Clinical Pharmacist [facility] [phone]

## 2024-05-20 ENCOUNTER — Ambulatory Visit (INDEPENDENT_AMBULATORY_CARE_PROVIDER_SITE_OTHER): Admitting: Vascular Surgery

## 2024-05-20 ENCOUNTER — Other Ambulatory Visit (INDEPENDENT_AMBULATORY_CARE_PROVIDER_SITE_OTHER)

## 2024-05-20 ENCOUNTER — Encounter (INDEPENDENT_AMBULATORY_CARE_PROVIDER_SITE_OTHER): Payer: Self-pay | Admitting: Vascular Surgery

## 2024-05-20 VITALS — BP 120/87 | HR 80 | Ht 71.0 in | Wt 150.0 lb

## 2024-05-20 DIAGNOSIS — L97909 Non-pressure chronic ulcer of unspecified part of unspecified lower leg with unspecified severity: Secondary | ICD-10-CM | POA: Diagnosis not present

## 2024-05-20 DIAGNOSIS — I70299 Other atherosclerosis of native arteries of extremities, unspecified extremity: Secondary | ICD-10-CM

## 2024-05-20 DIAGNOSIS — I420 Dilated cardiomyopathy: Secondary | ICD-10-CM

## 2024-05-20 DIAGNOSIS — I10 Essential (primary) hypertension: Secondary | ICD-10-CM

## 2024-05-20 DIAGNOSIS — E1142 Type 2 diabetes mellitus with diabetic polyneuropathy: Secondary | ICD-10-CM

## 2024-05-20 DIAGNOSIS — E785 Hyperlipidemia, unspecified: Secondary | ICD-10-CM

## 2024-05-20 DIAGNOSIS — Z794 Long term (current) use of insulin: Secondary | ICD-10-CM

## 2024-05-20 NOTE — Assessment & Plan Note (Signed)
 ABI's today are 1.06 on the right and 1.07 on the left with good triphasic waveforms status post revascularization.  He has apparently burned his foot or had some sort of other issue create an open wound with blistering on the top of his great toe and the forefoot.  I am going to prescribe him some Silvadene cream today and recommend he get into see his podiatrist.  His wound does have adequate blood flow for healing.  I will plan to see him back in a month to recheck the foot and plan ABIs in 6 months.

## 2024-05-20 NOTE — Assessment & Plan Note (Signed)
 lipid control important in reducing the progression of atherosclerotic disease. Continue statin therapy

## 2024-05-20 NOTE — Progress Notes (Signed)
 MRN : [id number]  Mark Nash is a 68 y.o. (10-04-1955) male who presents with chief complaint of  Chief Complaint  Patient presents with   Follow-up  .  History of Present Illness: Patient returns today in follow up of his PAD.  A little over 3 months ago, he underwent extensive bilateral lower extremity revascularization including femoral endarterectomies bilaterally as well as some infrainguinal revascularization.  His initial wounds have healed, but a few weeks ago he developed some sort of wound on the top of the left great toe and forefoot.  It looks like a burn but he does not recall any burn injury.  It is not painful.  He has had swelling in his leg.  No fever or chills. ABI's today are 1.06 on the right and 1.07 on the left with good triphasic waveforms status post revascularization.  Current Outpatient Medications  Medication Sig Dispense Refill   apixaban  (ELIQUIS ) 5 MG TABS tablet Take 1 tablet by mouth twice daily 180 tablet 1   blood glucose meter kit and supplies KIT Dispense based on patient and insurance preference. Use up to tid E11.65, LON:99 1 each 0   Blood Glucose Monitoring Suppl (ONETOUCH VERIO FLEX SYSTEM) w/Device KIT Use to check blood sugar up to four times daily as directed 1 kit 0   Budeson-Glycopyrrol-Formoterol  (BREZTRI  AEROSPHERE) 160-9-4.8 MCG/ACT AERO Inhale 2 puffs into the lungs 2 (two) times daily. Patient receives via [employer] Patient [employer]. 32.1 g 3   dapagliflozin  propanediol (FARXIGA ) 10 MG TABS tablet Take 1 tablet (10 mg total) by mouth daily. 90 tablet 3   digoxin  (LANOXIN ) 0.125 MG tablet Take 1/2 (one-half) tablet by mouth once daily 50 tablet 0   gabapentin  (NEURONTIN ) 300 MG capsule Take 300-600 mg by mouth See admin instructions. Take 1 capsule (300 mg) by mouth in the morning, take 1 capsule (300 mg) by mouth at noon, and 2 capsules (600 mg) by mouth in the evening     glucose blood (ONETOUCH VERIO) test strip Use to check blood sugar  up to four times daily as directed 400 each 3   insulin  degludec (TRESIBA ) 100 UNIT/ML FlexTouch Pen Inject 10-50 Units into the skin daily. (Patient taking differently: Inject 10-50 Units into the skin daily. Patient taking 10 units daily) 15 mL 0   Lancet Devices MISC 1 each by Does not apply route 3 (three) times daily as needed. What ins will cover dx:E11.65, LON:99 tid 100 each 12   Lancets (ONETOUCH DELICA PLUS LANCET30G) MISC Use to check blood sugar up to four times daily as directed 400 each 3   losartan  (COZAAR ) 25 MG tablet TAKE 1/2 (ONE-HALF) TABLET BY MOUTH ONCE DAILY SKIP  THE  DOSE  IF  SYSTOLIC  BP  LESS  THAN  110  MMHG 45 tablet 0   metoprolol  (TOPROL -XL) 200 MG 24 hr tablet Take 1 tablet (200 mg total) by mouth daily. Take with or immediately following a meal. 90 tablet 3   omeprazole  (PRILOSEC) 20 MG capsule Take 1 capsule (20 mg total) by mouth 3 (three) times daily. 90 capsule 0   oxyCODONE  (ROXICODONE ) 5 MG immediate release tablet Take 1 tablet (5 mg total) by mouth every 6 (six) hours as needed. (Patient not taking: Reported on 04/23/2024) 25 tablet 0   pravastatin  (PRAVACHOL ) 20 MG tablet Take 1 tablet (20 mg total) by mouth daily with supper. 90 tablet 3   Semaglutide ,0.25 or 0.5MG /DOS, (OZEMPIC , 0.25 OR 0.5 MG/DOSE,) 2  MG/1.5ML SOPN Inject 0.25 mg into the skin once a week. (Patient taking differently: Inject 0.5 mg into the skin once a week.) 2 mL 0   tiZANidine  (ZANAFLEX ) 4 MG tablet Take 4 mg by mouth every 12 (twelve) hours.     traMADol  (ULTRAM ) 50 MG tablet Take 50 mg by mouth every 12 (twelve) hours.     triamcinolone  cream (KENALOG ) 0.1 % Apply topically 2 (two) times daily. (Patient not taking: Reported on 04/23/2024)     Vitamin D , Ergocalciferol , (DRISDOL ) 1.25 MG (50000 UNIT) CAPS capsule TAKE ONE CAPSULE BY MOUTH EVERY 14 DAYS 6 capsule 1   No current facility-administered medications for this visit.    Past Medical History:  Diagnosis Date   Aortic  atherosclerosis    Aortic root dilatation    Arthritis    Asthma    Atrial fibrillation and flutter (HCC)    a.) CHA2DS2VASc = 5 (age, HFrEF, HTN, vascular disease, T2DM) as of 02/19/2024; b.) s/p DCCV 05/20/2021 --> 150 J x 1 and 200 J x 3 (converted to NSR after 1st 200 J x 1 min with ERAF); c.) rate/rhythm maintained on oral digoxin  + metoprolol  succinate; chronically anticoagulated with apixaban    Calcaneal spur    Colon polyps    Constipation    COPD (chronic obstructive pulmonary disease) (HCC)    Coronary artery disease    DDD (degenerative disc disease), lumbar    a.) s/p L3-L5 laminectomy/decompression in 2017   Diverticulosis    Dyspnea    GERD (gastroesophageal reflux disease)    Hepatitis C    a.) has not been Tx'd; b.) endorses IVDU ages [age]   HFrEF (heart failure with reduced ejection fraction) (HCC)    History of intravenous drug abuse    a.) age 80-25   History of kidney stones    History of right cataract extraction    Hyperlipidemia    Hypertension 06/10/2018   Left inguinal hernia    NICM (nonischemic cardiomyopathy) (HCC)    On apixaban  therapy    Peripheral vascular disease    Pulmonary hypertension (HCC)    Restless legs    Schizoaffective disorder (HCC)    a.) refuses to consult with psychiatry per PCP   Status post bilateral inguinal hernia repair    T2DM (type 2 diabetes mellitus) (HCC)    Thrombocytopenia    Vitamin D  deficiency    Wears full dentures     Past Surgical History:  Procedure Laterality Date   APPLICATION OF CELL SAVER N/A 02/20/2024   Procedure: APPLICATION OF CELL SAVER;  Surgeon: Marea Selinda RAMAN, MD;  Location: [facility];  Service: Vascular;  Laterality: N/A;   CARDIOVERSION N/A 05/20/2021   Procedure: CARDIOVERSION;  Surgeon: Darron Deatrice LABOR, MD;  Location: [facility];  Service: Cardiovascular;  Laterality: N/A;   CATARACT EXTRACTION W/PHACO Right 10/31/2021   Procedure: CATARACT EXTRACTION PHACO AND INTRAOCULAR LENS PLACEMENT  (IOC) RIGHT DIABETIC;  Surgeon: Myrna Adine Anes, MD;  Location: [facility];  Service: Ophthalmology;  Laterality: Right;  Diabetic 4.34 00:42.8   COLONOSCOPY WITH PROPOFOL  N/A 09/07/2017   Procedure: COLONOSCOPY WITH PROPOFOL ;  Surgeon: Janalyn Keene NOVAK, MD;  Location: [facility];  Service: Endoscopy;  Laterality: N/A;   ENDARTERECTOMY FEMORAL Bilateral 02/20/2024   Procedure: ENDARTERECTOMY, FEMORAL;  Surgeon: Marea Selinda RAMAN, MD;  Location: [facility];  Service: Vascular;  Laterality: Bilateral;   ESOPHAGOGASTRODUODENOSCOPY (EGD) WITH PROPOFOL  N/A 12/07/2017   Procedure: ESOPHAGOGASTRODUODENOSCOPY (EGD) WITH PROPOFOL ;  Surgeon: Janalyn Keene NOVAK,  MD;  Location: [facility];  Service: Endoscopy;  Laterality: N/A;   FLEXIBLE SIGMOIDOSCOPY N/A 11/09/2017   Procedure: FLEXIBLE SIGMOIDOSCOPY;  Surgeon: Janalyn Keene NOVAK, MD;  Location: [facility];  Service: Endoscopy;  Laterality: N/A;   INGUINAL HERNIA REPAIR Bilateral 06/21/2018   Procedure: LAPAROSCOPIC BILATERAL INGUINAL HERNIA REPAIR;  Surgeon: Nicholaus Selinda Birmingham, MD;  Location: [facility];  Service: General;  Laterality: Bilateral;   INTRAVASCULAR STENT INCLUDING PTA AND IMAGING Right 02/20/2024   Procedure: INSERTION, STENT, INTRAVASCULAR, PERCUTANEOUS, WITH IMAGING GUIDANCE;  Surgeon: Marea Selinda RAMAN, MD;  Location: [facility];  Service: Vascular;  Laterality: Right;  Popliteal stent   L3 TO L5 LAMINECTOMY FOR DECOMPRESSION  07/17/2016   [facility]   LOWER EXTREMITY ANGIOGRAM Right 02/20/2024   Procedure: GERALYN, LOWER EXTREMITY;  Surgeon: Marea Selinda RAMAN, MD;  Location: [facility];  Service: Vascular;  Laterality: Right;  Right leg   LOWER EXTREMITY ANGIOGRAPHY Right 11/05/2023   Procedure: Lower Extremity Angiography;  Surgeon: Marea Selinda RAMAN, MD;  Location: [facility];  Service: Cardiovascular;  Laterality: Right;   REPAIR OF CEREBROSPINAL FLUID LEAK N/A 09/08/2016   Procedure: Lumbar wound  exploration, repair of pseudomenigocele;  Surgeon: Morene Hicks Ditty, MD;  Location: [facility];  Service: Neurosurgery;  Laterality: N/A;  Lumbar wound exploration, repair of pseudomenigocele   RIGHT/LEFT HEART CATH AND CORONARY ANGIOGRAPHY N/A 01/29/2023   Procedure: RIGHT/LEFT HEART CATH AND CORONARY ANGIOGRAPHY;  Surgeon: Darron Deatrice LABOR, MD;  Location: [facility];  Service: Cardiovascular;  Laterality: N/A;   SINUSOTOMY     as a child     Social History   Tobacco Use   Smoking status: Every Day    Current packs/day: 1.50    Average packs/day: 1.5 packs/day for 46.0 years (69.0 ttl pk-yrs)    Types: Cigarettes   Smokeless tobacco: Never  Vaping Use   Vaping status: Never Used  Substance Use Topics   Alcohol use: No    Alcohol/week: 0.0 standard drinks of alcohol   Drug use: No      Family History  Problem Relation Age of Onset   Heart failure Mother    Hypertension Mother    Diverticulitis Mother    Colitis Mother    Diabetes Father    Hypertension Father    Heart disease Father    Diabetes Sister    Heart failure Brother    Diverticulitis Brother     Allergies  Allergen Reactions   Penicillins Hives and Swelling    Childhood allergy.  Has tolerated 3rd generation cephalosporin (CEFTRIAXONE ) without documented ADRs   Aspirin  Nausea Only   Ibuprofen Nausea Only   Lyrica  [Pregabalin ] Nausea Only   Acetaminophen  Nausea Only     REVIEW OF SYSTEMS (Negative unless checked)  Constitutional: [] Weight loss  [] Fever  [] Chills Cardiac: [] Chest pain   [] Chest pressure   [] Palpitations   [] Shortness of breath when laying flat   [] Shortness of breath at rest   [] Shortness of breath with exertion. Vascular:  [x] Pain in legs with walking   [] Pain in legs at rest   [] Pain in legs when laying flat   [x] Claudication   [] Pain in feet when walking  [] Pain in feet at rest  [] Pain in feet when laying flat   [] History of DVT   [] Phlebitis   [x] Swelling in legs    [] Varicose veins   [x] Non-healing ulcers Pulmonary:   [] Uses home oxygen   [] Productive cough   [] Hemoptysis   []   Wheeze  [] COPD   [] Asthma Neurologic:  [] Dizziness  [] Blackouts   [] Seizures   [] History of stroke   [] History of TIA  [] Aphasia   [] Temporary blindness   [] Dysphagia   [] Weakness or numbness in arms   [] Weakness or numbness in legs Musculoskeletal:  [] Arthritis   [] Joint swelling   [x] Joint pain   [] Low back pain Hematologic:  [] Easy bruising  [] Easy bleeding   [] Hypercoagulable state   [] Anemic   Gastrointestinal:  [] Blood in stool   [] Vomiting blood  [] Gastroesophageal reflux/heartburn   [] Abdominal pain Genitourinary:  [] Chronic kidney disease   [] Difficult urination  [] Frequent urination  [] Burning with urination   [] Hematuria Skin:  [] Rashes   [x] Ulcers   [x] Wounds Psychological:  [] History of anxiety   []  History of major depression.  Physical Examination  BP 120/87   Pulse 80   Ht 5' 11 (1.803 m)   Wt 150 lb (68 kg)   BMI 20.92 kg/m  Gen:  WD/WN, NAD. Appears older than stated age. Head: [place]/AT, No temporalis wasting. Ear/Nose/Throat: Hearing grossly intact, nares w/o erythema or drainage Eyes: Conjunctiva clear. Sclera non-icteric Neck: Supple.  Trachea midline Pulmonary:  Good air movement, no use of accessory muscles.  Cardiac: RRR, no JVD Vascular:  Vessel Right Left  Radial Palpable Palpable                          PT Palpable Palpable  DP Palpable Palpable   Gastrointestinal: soft, non-tender/non-distended. No guarding/reflex.  Musculoskeletal: M/S 5/5 throughout.  Blistering with the appearance of a burn under the blister on the top of the left great toe and forefoot area.  Mild surrounding erythema and swelling.  No purulent drainage. Neurologic: Sensation grossly intact in extremities.  Symmetrical.  Speech is fluent.  Psychiatric: Judgment intact, Mood & affect appropriate for pt's clinical situation. Dermatologic: Left foot wound as  above      Labs Recent Results (from the past 2160 hours)  Glucose, capillary     Status: Abnormal   Collection Time: 02/20/24  3:04 PM  Result Value Ref Range   Glucose-Capillary 151 (H) 70 - 99 mg/dL    Comment: Glucose reference range applies only to samples taken after fasting for at least 8 hours.  Glucose, capillary     Status: Abnormal   Collection Time: 02/20/24 10:07 PM  Result Value Ref Range   Glucose-Capillary 185 (H) 70 - 99 mg/dL    Comment: Glucose reference range applies only to samples taken after fasting for at least 8 hours.  CBC     Status: Abnormal   Collection Time: 02/21/24 12:10 AM  Result Value Ref Range   WBC 8.3 4.0 - 10.5 K/uL   RBC 4.93 4.22 - 5.81 MIL/uL   Hemoglobin 14.9 13.0 - 17.0 g/dL   HCT 54.8 60.9 - 47.9 %   MCV 91.5 80.0 - 100.0 fL   MCH 30.2 26.0 - 34.0 pg   MCHC 33.0 30.0 - 36.0 g/dL   RDW 86.1 88.4 - 84.4 %   Platelets 136 (L) 150 - 400 K/uL   nRBC 0.0 0.0 - 0.2 %    Comment: Performed at [facility], [address], [place], [place] [address]  Basic metabolic panel     Status: Abnormal   Collection Time: 02/21/24 12:10 AM  Result Value Ref Range   Sodium 136 135 - 145 mmol/L   Potassium 4.0 3.5 - 5.1 mmol/L   Chloride 104  98 - 111 mmol/L   CO2 24 22 - 32 mmol/L   Glucose, Bld 184 (H) 70 - 99 mg/dL    Comment: Glucose reference range applies only to samples taken after fasting for at least 8 hours.   BUN 19 8 - 23 mg/dL   Creatinine, Ser 9.24 0.61 - 1.24 mg/dL   Calcium 8.5 (L) 8.9 - 10.3 mg/dL   GFR, Estimated >39 >39 mL/min    Comment: (NOTE) Calculated using the CKD-EPI Creatinine Equation (2021)    Anion gap 8 5 - 15    Comment: Performed at [facility], [address] [place], [place] [address]  Glucose, capillary     Status: Abnormal   Collection Time: 02/21/24 11:17 AM  Result Value Ref Range   Glucose-Capillary 150 (H) 70 - 99 mg/dL    Comment: Glucose reference range applies only to samples  taken after fasting for at least 8 hours.  Digoxin  level     Status: None   Collection Time: 02/21/24  1:08 PM  Result Value Ref Range   Digoxin  Level 0.9 0.8 - 2.0 ng/mL    Comment: Performed at [facility], [address] [place], [place] [address]  Glucose, capillary     Status: Abnormal   Collection Time: 02/21/24  4:45 PM  Result Value Ref Range   Glucose-Capillary 100 (H) 70 - 99 mg/dL    Comment: Glucose reference range applies only to samples taken after fasting for at least 8 hours.  Glucose, capillary     Status: Abnormal   Collection Time: 02/21/24  7:55 PM  Result Value Ref Range   Glucose-Capillary 142 (H) 70 - 99 mg/dL    Comment: Glucose reference range applies only to samples taken after fasting for at least 8 hours.  Glucose, capillary     Status: Abnormal   Collection Time: 02/21/24  9:25 PM  Result Value Ref Range   Glucose-Capillary 130 (H) 70 - 99 mg/dL    Comment: Glucose reference range applies only to samples taken after fasting for at least 8 hours.  Glucose, capillary     Status: None   Collection Time: 02/22/24  8:12 AM  Result Value Ref Range   Glucose-Capillary 74 70 - 99 mg/dL    Comment: Glucose reference range applies only to samples taken after fasting for at least 8 hours.  Glucose, capillary     Status: Abnormal   Collection Time: 02/22/24 11:43 AM  Result Value Ref Range   Glucose-Capillary 199 (H) 70 - 99 mg/dL    Comment: Glucose reference range applies only to samples taken after fasting for at least 8 hours.  Glucose, capillary     Status: Abnormal   Collection Time: 02/22/24  4:12 PM  Result Value Ref Range   Glucose-Capillary 142 (H) 70 - 99 mg/dL    Comment: Glucose reference range applies only to samples taken after fasting for at least 8 hours.  Glucose, capillary     Status: Abnormal   Collection Time: 02/22/24  8:54 PM  Result Value Ref Range   Glucose-Capillary 157 (H) 70 - 99 mg/dL    Comment: Glucose reference range  applies only to samples taken after fasting for at least 8 hours.  Glucose, capillary     Status: Abnormal   Collection Time: 02/23/24  8:19 AM  Result Value Ref Range   Glucose-Capillary 113 (H) 70 - 99 mg/dL    Comment: Glucose reference range applies only to samples taken  after fasting for at least 8 hours.  Glucose, capillary     Status: Abnormal   Collection Time: 02/23/24 11:54 AM  Result Value Ref Range   Glucose-Capillary 191 (H) 70 - 99 mg/dL    Comment: Glucose reference range applies only to samples taken after fasting for at least 8 hours.  Glucose, capillary     Status: Abnormal   Collection Time: 02/23/24  4:37 PM  Result Value Ref Range   Glucose-Capillary 129 (H) 70 - 99 mg/dL    Comment: Glucose reference range applies only to samples taken after fasting for at least 8 hours.  Glucose, capillary     Status: Abnormal   Collection Time: 02/23/24  8:17 PM  Result Value Ref Range   Glucose-Capillary 119 (H) 70 - 99 mg/dL    Comment: Glucose reference range applies only to samples taken after fasting for at least 8 hours.  Glucose, capillary     Status: Abnormal   Collection Time: 02/24/24  7:57 AM  Result Value Ref Range   Glucose-Capillary 119 (H) 70 - 99 mg/dL    Comment: Glucose reference range applies only to samples taken after fasting for at least 8 hours.  VAS US  ABI WITH/WO TBI     Status: None   Collection Time: 03/10/24 11:46 AM  Result Value Ref Range   Right ABI 1.05    Left ABI 1.18   POCT HgB A1C     Status: Abnormal   Collection Time: 03/25/24  2:57 PM  Result Value Ref Range   Hemoglobin A1C 7.2 (A) 4.0 - 5.6 %   HbA1c POC (<> result, manual entry)     HbA1c, POC (prediabetic range)     HbA1c, POC (controlled diabetic range)      Radiology No results found.  Assessment/Plan  Dyslipidemia lipid control important in reducing the progression of atherosclerotic disease. Continue statin therapy   Atherosclerosis of artery of extremity with  ulceration (HCC) ABI's today are 1.06 on the right and 1.07 on the left with good triphasic waveforms status post revascularization.  He has apparently burned his foot or had some sort of other issue create an open wound with blistering on the top of his great toe and the forefoot.  I am going to prescribe him some Silvadene cream today and recommend he get into see his podiatrist.  His wound does have adequate blood flow for healing.  I will plan to see him back in a month to recheck the foot and plan ABIs in 6 months.  Essential (primary) hypertension blood pressure control important in reducing the progression of atherosclerotic disease. On appropriate oral medications.     Dilated cardiomyopathy (HCC) Could certainly worsen lower extremity swelling.   Diabetes mellitus with coincident hypertension (HCC) blood glucose control important in reducing the progression of atherosclerotic disease. Also, involved in wound healing. On appropriate medications.  Selinda Gu, MD  05/20/2024 2:58 PM    This note was created with Dragon medical transcription system.  Any errors from dictation are purely unintentional

## 2024-05-21 ENCOUNTER — Telehealth: Payer: Self-pay

## 2024-05-21 NOTE — Patient Instructions (Signed)
 Mark Nash - I am sorry I was unable to reach you today for our scheduled appointment. I work with Sowles, Krichna, MD and am calling to support your healthcare needs. Please contact me at [phone] at your earliest convenience. I look forward to speaking with you soon.   Thank you,  Nestora Duos, MSN, RN [facility]  [facility], [facility] RN Care Manager Direct Dial: [phone] Fax: [phone]

## 2024-05-23 LAB — VAS US ABI WITH/WO TBI
Left ABI: 1.07
Right ABI: 1.06

## 2024-05-23 NOTE — Progress Notes (Signed)
 S:     Chief Complaint  Patient presents with   Medication Management    Reason for visit: ?  Mark Nash is a 68 y.o. male with a history of diabetes (type 2), who presents today for a follow up diabetes pharmacotherapy visit.? Pertinent PMH also includes HTN, tobacco use disorder, asthma, COPD, Afib, HFimpEF, CAD, PAD, and HLD.  Care Team: Primary Care Provider: Sowles, Krichna, MD  At last visit with clinical pharmacist on 01/30/24, patient was instructed to begin monitoring BP at home and bringing a log to follow up appointments.  Current diabetes medications include: Farxiga  10 mg daily, Ozempic  0.5 mg weekly, Tresiba  10 units daily Previous diabetes medications include: pioglitazone  (CHF), Levemir  Current hyperlipidemia medications include: pravastatin  20 mg daily Current hypertension medications include: losartan  12.5 mg daily, Toprol  XL 200 mg daily  Other: digoxin  0.0625 mg daily   Patient reports adherence to taking all medications as prescribed.   Have you been experiencing any side effects to the medications prescribed? Yes - some nausea with Trulicity Do you have any problems obtaining medications due to transportation or finances? Yes - some high copays for medications (Eliquis ) Insurance coverage: [employer]  Patient denies hypoglycemic events.  Reported home fasting blood sugars: not checking - ran out of test strips  Patient reports nocturia (nighttime urination).  Patient reports neuropathy (nerve pain). Patient denies visual changes. Patient reports self foot exams.   Patient reported dietary habits: Eats 4 small meals/day Breakfast: oatmeal, sausage biscuit Lunch: sandwiches Dinner: protein, beans/rice, tv dinners Snacks: ice cream (carb smart), sugar-free chocolate, peanut butter nabs Drinks: water  Patient-reported exercise habits: limited d/t injury on the top of his foot  HypertensionHFimpEF: Patient has automated, upper arm home  BP cuff Current blood pressure readings readings: not checking at home  Patient denies hypotensive s/sx including dizziness, lightheadedness.  Patient denies hypertensive symptoms including chest pain Patient endorses shortness of breath with exertion  Patient denies LEE  Of note, difficulty with titration of GDMT d/t relative hypotension.   DM Prevention:  Statin: Taking; low intensity.?  History of albuminuria? yes, last UACR on 11/19/23 = 22 mg/g Last eye exam:  Lab Results  Component Value Date   HMDIABEYEEXA No Retinopathy 10/24/2023   Tobacco Use:  Tobacco Use: High Risk (05/20/2024)   Patient History    Smoking Tobacco Use: Every Day    Smokeless Tobacco Use: Never    Passive Exposure: Not on file   O:   Vitals:  Wt Readings from Last 3 Encounters:  05/20/24 150 lb (68 kg)  03/25/24 156 lb 1.6 oz (70.8 kg)  03/18/24 154 lb (69.9 kg)   BP Readings from Last 3 Encounters:  05/20/24 120/87  03/25/24 116/62  03/18/24 117/69   Pulse Readings from Last 3 Encounters:  05/20/24 80  03/25/24 67  03/18/24 (!) 53     Labs:?  Lab Results  Component Value Date   HGBA1C 7.2 (A) 03/25/2024   HGBA1C 10.4 (A) 11/19/2023   HGBA1C 10.5 (A) 07/20/2023   GLUCOSE 184 (H) 02/21/2024   MICRALBCREAT 22 11/19/2023   MICRALBCREAT 84 (H) 11/15/2022   MICRALBCREAT 16 11/11/2021   CREATININE 0.75 02/21/2024   CREATININE 1.00 12/24/2023   CREATININE 0.95 11/19/2023    Lab Results  Component Value Date   CHOL 116 11/19/2023   LDLCALC 42 11/19/2023   LDLCALC 29 11/15/2022   LDLCALC 43 11/11/2021   HDL 58 11/19/2023   TRIG 84 11/19/2023   TRIG  46 11/15/2022   TRIG 46 11/11/2021   ALT 17 11/19/2023   ALT 34 02/07/2023   AST 17 11/19/2023   AST 26 02/07/2023      Chemistry      Component Value Date/Time   NA 136 02/21/2024 0010   NA 139 12/24/2023 1559   K 4.0 02/21/2024 0010   CL 104 02/21/2024 0010   CO2 24 02/21/2024 0010   BUN 19 02/21/2024 0010   BUN 14  12/24/2023 1559   CREATININE 0.75 02/21/2024 0010   CREATININE 0.95 11/19/2023 1453      Component Value Date/Time   CALCIUM 8.5 (L) 02/21/2024 0010   ALKPHOS 87 01/22/2023 0615   AST 17 11/19/2023 1453   ALT 17 11/19/2023 1453   BILITOT 1.3 (H) 11/19/2023 1453   BILITOT 1.0 11/10/2015 1045       The ASCVD Risk score (Arnett DK, et al., 2019) failed to calculate for the following reasons:   The valid total cholesterol range is 130 to 320 mg/dL  Lab Results  Component Value Date   MICRALBCREAT 22 11/19/2023   MICRALBCREAT 84 (H) 11/15/2022   MICRALBCREAT 16 11/11/2021   MICRALBCREAT 4 09/15/2020   MICRALBCREAT 4 03/10/2019    A/P: Diabetes currently controlled with a most recent A1c of 7.2% on 03/25/24. Goal <8% given age and comorbidities. Unable to fully assess control, given he is not currently monitoring BG at home since he ran out of testing supplies. Patient is able to verbalize appropriate hypoglycemia management plan. Medication adherence appears appropriate. Patient assistance will no longer be eligible for Medicare patients for Ozempic  in 2026. Will have patient apply for LIS to see if this might help with prescription copays. In the future, could consider dose increase for Ozempic  and discontinuation of insulin  given low insulin  dose. Will have to monitor for weight loss.  -Continued basal insulin  Tresiba  (insulin  degludec)  10 units daily.  -Continued GLP-1 Ozempic  (semaglutide ) 0.5 mg .  -Continued SGLT2-I Farxiga  (dapagliflozin )10 mg daily.  -Extensively discussed pathophysiology of diabetes, recommended lifestyle interventions, dietary effects on blood sugar control.  -Counseled on s/sx of and management of hypoglycemia.  -Provided phone number for Medicare Extra Help to set up appointment for enrollment.  -Encouraged patient to pick up refill of test strips from the pharmacy  ASCVD risk - secondary prevention in patient with diabetes. Last LDL is 42 mg/dL, at goal  of <44 mg/dL.  -Continued pravastatin  20 mg daily.   Patient verbalized understanding of treatment plan. Total time patient counseling 30 minutes.  Follow-up:  Pharmacist on 06/24/24 PCP clinic visit on 07/28/24  [name], PharmD Clinical Pharmacist [facility] [phone]

## 2024-05-26 ENCOUNTER — Other Ambulatory Visit (INDEPENDENT_AMBULATORY_CARE_PROVIDER_SITE_OTHER): Payer: Self-pay

## 2024-05-26 DIAGNOSIS — Z7985 Long-term (current) use of injectable non-insulin antidiabetic drugs: Secondary | ICD-10-CM

## 2024-05-26 DIAGNOSIS — E1142 Type 2 diabetes mellitus with diabetic polyneuropathy: Secondary | ICD-10-CM

## 2024-05-26 DIAGNOSIS — Z7984 Long term (current) use of oral hypoglycemic drugs: Secondary | ICD-10-CM

## 2024-05-26 DIAGNOSIS — Z794 Long term (current) use of insulin: Secondary | ICD-10-CM

## 2024-05-27 ENCOUNTER — Other Ambulatory Visit: Payer: Self-pay | Admitting: Cardiology

## 2024-05-27 ENCOUNTER — Telehealth: Payer: Self-pay

## 2024-05-27 DIAGNOSIS — I5020 Unspecified systolic (congestive) heart failure: Secondary | ICD-10-CM

## 2024-05-27 DIAGNOSIS — R079 Chest pain, unspecified: Secondary | ICD-10-CM

## 2024-05-27 DIAGNOSIS — I10 Essential (primary) hypertension: Secondary | ICD-10-CM

## 2024-05-27 DIAGNOSIS — I2089 Other forms of angina pectoris: Secondary | ICD-10-CM

## 2024-05-27 DIAGNOSIS — I4819 Other persistent atrial fibrillation: Secondary | ICD-10-CM

## 2024-05-27 DIAGNOSIS — R0602 Shortness of breath: Secondary | ICD-10-CM

## 2024-05-27 NOTE — Telephone Encounter (Signed)
 Gave pt a call pt is coming up due for reenrollement,pt knows he will be receiving in the mail both application for AZ&ME (Farxiga  and Breztri ) and fax provider portion today.

## 2024-06-09 NOTE — Patient Outreach (Signed)
 RNCM - 3rd attempt to reach patient to reschedule missed appointment 05/21/2024. Patient declined to reschedule at this time, aware will close from [facility] program and to notify PCP if he would like services in the future. PCP notified.

## 2024-06-17 ENCOUNTER — Ambulatory Visit (INDEPENDENT_AMBULATORY_CARE_PROVIDER_SITE_OTHER): Admitting: Vascular Surgery

## 2024-06-22 NOTE — Progress Notes (Unsigned)
 Cardiology Office Note    Date:  06/23/2024   ID:  Mark Nash, DOB Dec 24, 1955, MRN [id number]  PCP:  Glenard Mire, MD  Cardiologist:  Evalene Lunger, MD  Electrophysiologist:  OLE ONEIDA HOLTS, MD   Chief Complaint: Follow up  History of Present Illness:   Mark Nash is a 68 y.o. male with history of nonobstructive CAD by LHC in 01/2023, HFrEF secondary to NICM possibly tachycardia induced, persistent A-fib/flutter diagnosed in 2022, schizoaffective/schizophrenia disorder, PAD status post intervention followed by vascular surgery, HTN, HLD, DM2, hepatitis C, COPD, and tobacco use who presents for follow-up of his CAD, cardiomyopathy, and A-fib/flutter.   He was diagnosed with A-fib/flutter in 02/2021.  Echo in 03/2021 showed an EF of 55 to 60%, no regional wall motion abnormalities, normal RV systolic function and ventricular cavity size, and trivial mitral regurgitation.  Lexiscan  MPI in 04/2021 showed no significant ischemia with an EF of 59% and was overall low risk.  CT attenuation corrected images showed three-vessel coronary artery calcification.  He was evaluated by EP and initially scheduled for A-fib ablation, though this was canceled due to lower extremity wounds and swelling, and has not been revisited.  Previously failed DCCV.  Echo in 06/2021 showed an EF of 55 to 60%, no regional wall motion abnormalities, normal RV systolic function and ventricular cavity size, mildly dilated left atrium, aortic valve sclerosis without evidence of stenosis, dilated aortic root measuring 36 mm, dilated ascending aorta measuring 37 mm, and an estimated right atrial pressure of 3 mmHg.  Echo in 08/2022 showed an EF of 45 to 50%, global hypokinesis, mildly dilated LV internal cavity size, normal RV systolic function, mild biatrial enlargement, and an estimated right atrial pressure of 15 mmHg.  Rhythm during study was atrial flutter.  Lexiscan  MPI in 12/2022 showed no evidence of significant  ischemia and was felt to overall be a low risk MPI.     He was admitted to the hospital in 12/2022 with COPD exacerbation and diagnosed with rhinovirus and possibly pneumonia.  A-fib/flutter with RVR was difficult to control.  Limited echo from 12/2022 showed an EF of 30 to 35% normal RV systolic function and ventricular cavity size, moderately dilated left atrium, and mildly dilated aortic root measuring 41 mm.  R/LHC in 01/2023 showed mildly calcified coronary artery with mild nonobstructive CAD.  RHC showed normal filling pressures, mild pulmonary hypertension, and normal cardiac output.  Medical therapy was recommended for nonischemic cardiomyopathy felt to be tachycardia induced.  Limited echo from 04/2023 showed an EF of 50 to 55%, no regional wall motion abnormalities, normal RV systolic function, ventricular cavity size, and RVSP, mild to moderate tricuspid regurgitation, normal size and structure aortic root, and an estimated right atrial pressure of 3 mmHg.    He was most recently seen in the office in 11/2023 for preoperative cardiac restratification for intervention of PAD.  He subsequently underwent bilateral common femoral, superficial femoral, and profunda femoris endarterectomy as well as open angioplasty and stent placement to the right popliteal artery in 01/2024 without significant cardiac complication.  Most recent ABIs from 04/2024 normal bilaterally.  He comes in doing well from a cardiac perspective and is without symptoms of angina or cardiac decompensation.  He feels like his breathing continues to improve.  Tries to remain active with walking when the weather is nice outdoors and will walk around the perimeter of [employer] if the weather is too hot or too cold.  No lower extremity swelling  or nonhealing wounds.  No progressive orthopnea.  Appetite is diminished on GLP-1 therapy.  No falls, hematochezia, or melena.  Continues to smoke 1 pack daily and is not yet ready to fully quit.  Does not  have any acute cardiac concerns at this time.   Labs independently reviewed: 02/2024 - A1c 7.2 01/2024 - potassium 4.0, BUN 19, serum creatinine 0.75, Hgb 14.9, PLT 136, digoxin  0.9 10/2023 - albumin 3.9, AST/ALT normal, TC 116, TG 84, HDL 58, LDL 42, A1c 10.4 01/2023 - TSH normal  Past Medical History:  Diagnosis Date   Aortic atherosclerosis    Aortic root dilatation    Arthritis    Asthma    Atrial fibrillation and flutter (HCC)    a.) CHA2DS2VASc = 5 (age, HFrEF, HTN, vascular disease, T2DM) as of 02/19/2024; b.) s/p DCCV 05/20/2021 --> 150 J x 1 and 200 J x 3 (converted to NSR after 1st 200 J x 1 min with ERAF); c.) rate/rhythm maintained on oral digoxin  + metoprolol  succinate; chronically anticoagulated with apixaban    Calcaneal spur    Colon polyps    Constipation    COPD (chronic obstructive pulmonary disease) (HCC)    Coronary artery disease    DDD (degenerative disc disease), lumbar    a.) s/p L3-L5 laminectomy/decompression in 2017   Diverticulosis    Dyspnea    GERD (gastroesophageal reflux disease)    Hepatitis C    a.) has not been Tx'd; b.) endorses IVDU ages [age]   HFrEF (heart failure with reduced ejection fraction) (HCC)    History of intravenous drug abuse    a.) age [age]   History of kidney stones    History of right cataract extraction    Hyperlipidemia    Hypertension 06/10/2018   Left inguinal hernia    NICM (nonischemic cardiomyopathy) (HCC)    On apixaban  therapy    Peripheral vascular disease    Pulmonary hypertension (HCC)    Restless legs    Schizoaffective disorder (HCC)    a.) refuses to consult with psychiatry per PCP   Status post bilateral inguinal hernia repair    T2DM (type 2 diabetes mellitus) (HCC)    Thrombocytopenia    Vitamin D  deficiency    Wears full dentures     Past Surgical History:  Procedure Laterality Date   APPLICATION OF CELL SAVER N/A 02/20/2024   Procedure: APPLICATION OF CELL SAVER;  Surgeon: Marea Selinda RAMAN, MD;   Location: [facility];  Service: Vascular;  Laterality: N/A;   CARDIOVERSION N/A 05/20/2021   Procedure: CARDIOVERSION;  Surgeon: Darron Deatrice LABOR, MD;  Location: [facility];  Service: Cardiovascular;  Laterality: N/A;   CATARACT EXTRACTION W/PHACO Right 10/31/2021   Procedure: CATARACT EXTRACTION PHACO AND INTRAOCULAR LENS PLACEMENT (IOC) RIGHT DIABETIC;  Surgeon: Myrna Adine Anes, MD;  Location: [facility];  Service: Ophthalmology;  Laterality: Right;  Diabetic 4.34 00:42.8   COLONOSCOPY WITH PROPOFOL  N/A 09/07/2017   Procedure: COLONOSCOPY WITH PROPOFOL ;  Surgeon: Janalyn Keene NOVAK, MD;  Location: [facility];  Service: Endoscopy;  Laterality: N/A;   ENDARTERECTOMY FEMORAL Bilateral 02/20/2024   Procedure: ENDARTERECTOMY, FEMORAL;  Surgeon: Marea Selinda RAMAN, MD;  Location: [facility];  Service: Vascular;  Laterality: Bilateral;   ESOPHAGOGASTRODUODENOSCOPY (EGD) WITH PROPOFOL  N/A 12/07/2017   Procedure: ESOPHAGOGASTRODUODENOSCOPY (EGD) WITH PROPOFOL ;  Surgeon: Janalyn Keene NOVAK, MD;  Location: [facility];  Service: Endoscopy;  Laterality: N/A;   FLEXIBLE SIGMOIDOSCOPY N/A 11/09/2017   Procedure: FLEXIBLE SIGMOIDOSCOPY;  Surgeon: Janalyn Keene NOVAK,  MD;  Location: [facility];  Service: Endoscopy;  Laterality: N/A;   INGUINAL HERNIA REPAIR Bilateral 06/21/2018   Procedure: LAPAROSCOPIC BILATERAL INGUINAL HERNIA REPAIR;  Surgeon: Nicholaus Selinda Birmingham, MD;  Location: [facility];  Service: General;  Laterality: Bilateral;   INTRAVASCULAR STENT INCLUDING PTA AND IMAGING Right 02/20/2024   Procedure: INSERTION, STENT, INTRAVASCULAR, PERCUTANEOUS, WITH IMAGING GUIDANCE;  Surgeon: Marea Selinda RAMAN, MD;  Location: [facility];  Service: Vascular;  Laterality: Right;  Popliteal stent   L3 TO L5 LAMINECTOMY FOR DECOMPRESSION  07/17/2016   [facility]   LOWER EXTREMITY ANGIOGRAM Right 02/20/2024   Procedure: GERALYN, LOWER EXTREMITY;  Surgeon: Marea Selinda RAMAN, MD;  Location: [facility];  Service: Vascular;  Laterality: Right;  Right leg   LOWER EXTREMITY ANGIOGRAPHY Right 11/05/2023   Procedure: Lower Extremity Angiography;  Surgeon: Marea Selinda RAMAN, MD;  Location: [facility];  Service: Cardiovascular;  Laterality: Right;   REPAIR OF CEREBROSPINAL FLUID LEAK N/A 09/08/2016   Procedure: Lumbar wound exploration, repair of pseudomenigocele;  Surgeon: Morene Hicks Ditty, MD;  Location: [facility];  Service: Neurosurgery;  Laterality: N/A;  Lumbar wound exploration, repair of pseudomenigocele   RIGHT/LEFT HEART CATH AND CORONARY ANGIOGRAPHY N/A 01/29/2023   Procedure: RIGHT/LEFT HEART CATH AND CORONARY ANGIOGRAPHY;  Surgeon: Darron Deatrice LABOR, MD;  Location: [facility];  Service: Cardiovascular;  Laterality: N/A;   SINUSOTOMY     as a child    Current Medications: Current Meds  Medication Sig   apixaban  (ELIQUIS ) 5 MG TABS tablet Take 1 tablet by mouth twice daily   blood glucose meter kit and supplies KIT Dispense based on patient and insurance preference. Use up to tid E11.65, LON:99   Blood Glucose Monitoring Suppl (ONETOUCH VERIO FLEX SYSTEM) w/Device KIT Use to check blood sugar up to four times daily as directed   Budeson-Glycopyrrol-Formoterol  (BREZTRI  AEROSPHERE) 160-9-4.8 MCG/ACT AERO Inhale 2 puffs into the lungs 2 (two) times daily. Patient receives via [employer] Patient Assistance.   dapagliflozin  propanediol (FARXIGA ) 10 MG TABS tablet Take 1 tablet (10 mg total) by mouth daily.   digoxin  (LANOXIN ) 0.125 MG tablet Take 1/2 (one-half) tablet by mouth once daily   gabapentin  (NEURONTIN ) 300 MG capsule Take 300-600 mg by mouth See admin instructions. Take 1 capsule (300 mg) by mouth in the morning, take 1 capsule (300 mg) by mouth at noon, and 2 capsules (600 mg) by mouth in the evening   glucose blood (ONETOUCH VERIO) test strip Use to check blood sugar up to four times daily as directed   insulin  degludec (TRESIBA ) 100 UNIT/ML FlexTouch Pen Inject 10-50  Units into the skin daily. (Patient taking differently: Inject 10-50 Units into the skin daily. Patient taking 10 units daily)   Lancet Devices MISC 1 each by Does not apply route 3 (three) times daily as needed. What ins will cover dx:E11.65, LON:99 tid   Lancets (ONETOUCH DELICA PLUS LANCET30G) MISC Use to check blood sugar up to four times daily as directed   losartan  (COZAAR ) 25 MG tablet TAKE 1/2 (ONE-HALF) TABLET BY MOUTH ONCE DAILY SKIP  DOSE  IF  SYSTOLIC  BP  LESS  THAN  110  MMHG   metoprolol  (TOPROL -XL) 200 MG 24 hr tablet Take 1 tablet (200 mg total) by mouth daily. Take with or immediately following a meal.   omeprazole  (PRILOSEC) 20 MG capsule Take 1 capsule (20 mg total) by mouth 3 (three) times daily.   pravastatin  (PRAVACHOL ) 20 MG  tablet Take 1 tablet (20 mg total) by mouth daily with supper.   Semaglutide ,0.25 or 0.5MG /DOS, (OZEMPIC , 0.25 OR 0.5 MG/DOSE,) 2 MG/1.5ML SOPN Inject 0.25 mg into the skin once a week. (Patient taking differently: Inject 0.5 mg into the skin once a week.)   tiZANidine  (ZANAFLEX ) 4 MG tablet Take 4 mg by mouth every 12 (twelve) hours.   traMADol  (ULTRAM ) 50 MG tablet Take 50 mg by mouth every 12 (twelve) hours.   Vitamin D , Ergocalciferol , (DRISDOL ) 1.25 MG (50000 UNIT) CAPS capsule TAKE ONE CAPSULE BY MOUTH EVERY 14 DAYS    Allergies:   Penicillins, Aspirin , Ibuprofen, Lyrica  [pregabalin ], and Acetaminophen    Social History   Socioeconomic History   Marital status: Divorced    Spouse name: Not on file   Number of children: 2   Years of education: Not on file   Highest education level: 12th grade  Occupational History   Occupation: Disabled  Tobacco Use   Smoking status: Every Day    Current packs/day: 1.50    Average packs/day: 1.5 packs/day for 46.0 years (69.0 ttl pk-yrs)    Types: Cigarettes   Smokeless tobacco: Never  Vaping Use   Vaping status: Never Used  Substance and Sexual Activity   Alcohol use: No    Alcohol/week: 0.0  standard drinks of alcohol   Drug use: No   Sexual activity: Not Currently  Other Topics Concern   Not on file  Social History Narrative   Lives alone   Social Drivers of Health   Financial Resource Strain: Low Risk  (10/08/2023)   Received from [facility]   Overall Financial Resource Strain (CARDIA)    Difficulty of Paying Living Expenses: Not very hard  Food Insecurity: No Food Insecurity (04/23/2024)   Hunger Vital Sign    Worried About Running Out of Food in the Last Year: Never true    Ran Out of Food in the Last Year: Never true  Transportation Needs: No Transportation Needs (04/23/2024)   PRAPARE - Administrator, Civil Service (Medical): No    Lack of Transportation (Non-Medical): No  Physical Activity: Insufficiently Active (10/04/2023)   Exercise Vital Sign    Days of Exercise per Week: 1 day    Minutes of Exercise per Session: 10 min  Stress: No Stress Concern Present (10/04/2023)   [employer] of Occupational Health - Occupational Stress Questionnaire    Feeling of Stress : Not at all  Social Connections: Socially Isolated (02/20/2024)   Social Connection and Isolation Panel    Frequency of Communication with Friends and Family: Once a week    Frequency of Social Gatherings with Friends and Family: Never    Attends Religious Services: Never    Database Administrator or Organizations: No    Attends Engineer, Structural: Never    Marital Status: Divorced     Family History:  The patient's family history includes Colitis in his mother; Diabetes in his father and sister; Diverticulitis in his brother and mother; Heart disease in his father; Heart failure in his brother and mother; Hypertension in his father and mother.  ROS:   12-point review of systems is negative unless otherwise noted in the HPI.   EKGs/Labs/Other Studies Reviewed:    Studies reviewed were summarized above. The additional studies were reviewed  today:  Limited echo 05/09/2023: 1. Left ventricular ejection fraction, by estimation, is 50 to 55%. The  left ventricle has low normal function. The left  ventricle has no regional  wall motion abnormalities. Left ventricular diastolic parameters are  indeterminate.   2. Right ventricular systolic function is normal. The right ventricular  size is normal. There is normal pulmonary artery systolic pressure. The  estimated right ventricular systolic pressure is 21.6 mmHg.   3. The mitral valve is normal in structure. No evidence of mitral valve  regurgitation. No evidence of mitral stenosis.   4. Tricuspid valve regurgitation is mild to moderate.   5. The aortic valve is normal in structure. Aortic valve regurgitation is  not visualized. No aortic stenosis is present.   6. The inferior vena cava is normal in size with greater than 50%  respiratory variability, suggesting right atrial pressure of 3 mmHg.  __________   [facility] 01/29/2023: 1.  Mildly calcified coronary arteries with mild nonobstructive coronary artery disease. 2.  Left ventricular angiography was not performed.  EF was moderately reduced by echo. 3.  Right heart catheterization showed normal filling pressures, mild pulmonary hypertension and normal cardiac output.   Recommendations: The patient has nonischemic cardiomyopathy likely tachycardia induced.  Recommend medical therapy. Continue Toprol  for anticoagulation. Resume heparin  2 hours after sheath pull and likely can transition to Eliquis  later this evening. __________   Limited echo 01/26/2023: 1. Left ventricular ejection fraction, by estimation, is 30 to 35%. The  left ventricle has moderately decreased function. Left ventricular  endocardial border not optimally defined to evaluate regional wall motion.   2. Right ventricular systolic function is normal. The right ventricular  size is normal.   3. Left atrial size was moderately dilated.   4. The mitral valve is  normal in structure. No evidence of mitral valve  regurgitation. No evidence of mitral stenosis.   5. The aortic valve is calcified. Aortic valve regurgitation is not  visualized. No aortic stenosis is present.   6. Aortic dilatation noted. There is mild dilatation of the aortic root,  measuring 41 mm.   7. Challenging image quality. Most valves not well seen.  __________   Lexiscan  MPI 01/01/2023:   Sensitivity and specificity of the study are significant reduced due to artifact.  The resting images are uninterpretable due to severe extracardiac activity.   Abnormal, probably low risk pharmacologic myocardial.   There is a small in size, mild in severity, fixed apical anterior and apical defect most consistent with artifact but cannot rule out an element of scar.   There is no evidence of significant ischemia.   Left ventricular systolic function is moderately reduced (LVEF 35-45%).   Coronary artery calcification and aortic atherosclerosis are present.   No significant change from prior study on 05/12/2021 allowing for differences in artifact. __________   2D echo 09/11/2022: 1. Left ventricular ejection fraction, by estimation, is 45 to 50%. The  left ventricle has mildly decreased function. The left ventricle  demonstrates global hypokinesis. The left ventricular internal cavity size  was mildly dilated. Left ventricular  diastolic parameters are indeterminate.   2. Right ventricular systolic function is normal. The right ventricular  size is not well visualized.   3. Left atrial size was mildly dilated.   4. Right atrial size was mildly dilated.   5. The mitral valve is grossly normal. No evidence of mitral valve  regurgitation.   6. The aortic valve is calcified. Aortic valve regurgitation is not  visualized.   7. The inferior vena cava is dilated in size with <50% respiratory  variability, suggesting right atrial pressure of 15 mmHg.  8. Rhythm strip during this exam  demonstrates atrial flutter.   Comparison(s): 06/28/21 EF 55-60%.  __________   2D echo 06/28/2021: 1. Left ventricular ejection fraction, by estimation, is 55 to 60%. The  left ventricle has normal function. The left ventricle has no regional  wall motion abnormalities. Left ventricular diastolic parameters are  indeterminate.   2. Right ventricular systolic function is normal. The right ventricular  size is normal.   3. Left atrial size was mildly dilated.   4. The mitral valve was not well visualized. No evidence of mitral valve  regurgitation. No evidence of mitral stenosis.   5. The aortic valve was not well visualized. Aortic valve regurgitation  is not visualized. Mild to moderate aortic valve sclerosis/calcification  is present, without any evidence of aortic stenosis.   6. There is dilatation of the ascending aorta, measuring 37 mm. There is  dilatation of the aortic root, measuring 36 mm.   7. The inferior vena cava is normal in size with greater than 50%  respiratory variability, suggesting right atrial pressure of 3 mmHg.   Comparison(s): EF 55%, technically challenging due to limited acoustic  windows.  __________   Lexiscan  MPI 05/12/2021: Pharmacological myocardial perfusion imaging study with no significant  ischemia Attenuation corrected images with small fixed defect in the apical region, possible artifact though unable to exclude small scar This small fixed defect not noted on nonattenuation corrected images.  Normal wall motion, EF estimated at 59% Resting EKG with atrial fibrillation No EKG changes concerning for ischemia at peak stress or in recovery. CT attenuation correction images with moderate three-vessel coronary calcification Low risk scan __________   2D echo 04/20/2021 [facility]): 1. Normal LVF.   2. Left ventricular ejection fraction, by estimation, is 55 to 60%. Left  ventricular ejection fraction by PLAX is 55 %. The left ventricle has  normal  function. The left ventricle has no regional wall motion  abnormalities. Left ventricular diastolic  function could not be evaluated.   3. Right ventricular systolic function is normal. The right ventricular  size is normal.   4. The mitral valve is grossly normal. Trivial mitral valve  regurgitation.   5. The aortic valve is normal in structure. Aortic valve regurgitation is  not visualized.    EKG:  EKG is ordered today.  The EKG ordered today demonstrates atrial flutter with variable AV block, 87 bpm, nonspecific ST-T changes, consistent with prior tracings  Recent Labs: 11/19/2023: ALT 17 02/21/2024: BUN 19; Creatinine, Ser 0.75; Hemoglobin 14.9; Platelets 136; Potassium 4.0; Sodium 136  Recent Lipid Panel    Component Value Date/Time   CHOL 116 11/19/2023 1453   CHOL 112 07/13/2015 1018   TRIG 84 11/19/2023 1453   HDL 58 11/19/2023 1453   HDL 62 07/13/2015 1018   CHOLHDL 2.0 11/19/2023 1453   VLDL 12 12/15/2016 1405   LDLCALC 42 11/19/2023 1453    PHYSICAL EXAM:    VS:  BP 139/78 (BP Location: Left Arm, Patient Position: Sitting, Cuff Size: Normal)   Pulse 87 Comment: 65 oximeter  Ht 5' 11 (1.803 m)   Wt 150 lb 6.4 oz (68.2 kg)   SpO2 99%   BMI 20.98 kg/m   BMI: Body mass index is 20.98 kg/m.  Physical Exam Vitals reviewed.  Constitutional:      Appearance: He is well-developed.  HENT:     Head: Normocephalic and atraumatic.  Eyes:     General:        Right  eye: No discharge.        Left eye: No discharge.  Cardiovascular:     Rate and Rhythm: Normal rate. Rhythm irregularly irregular.     Heart sounds: Normal heart sounds, S1 normal and S2 normal. Heart sounds not distant. No midsystolic click and no opening snap. No murmur heard.    No friction rub.  Pulmonary:     Effort: Pulmonary effort is normal. No respiratory distress.     Breath sounds: Normal breath sounds. No decreased breath sounds, wheezing, rhonchi or rales.  Musculoskeletal:     Cervical  back: Normal range of motion.  Skin:    General: Skin is warm and dry.     Nails: There is no clubbing.  Neurological:     Mental Status: He is alert and oriented to person, place, and time.  Psychiatric:        Speech: Speech normal.        Behavior: Behavior normal.        Thought Content: Thought content normal.        Judgment: Judgment normal.     Wt Readings from Last 3 Encounters:  06/23/24 150 lb 6.4 oz (68.2 kg)  05/20/24 150 lb (68 kg)  03/25/24 156 lb 1.6 oz (70.8 kg)     ASSESSMENT & PLAN:   Nonobstructive CAD: He is doing well and without symptoms concerning for angina or cardiac decompensation.  Continue aggressive risk factor modification and current pharmacotherapy including apixaban  in lieu of aspirin  given underlying A-fib/flutter as well as Toprol -XL 200 mg and pravastatin  20 mg.  No indication for ischemic testing at this time.  HFimpEF: Euvolemic and well compensated.  Continue current pharmacotherapy including Toprol -XL 200 mg, losartan  12.5 mg, Farxiga  10 mg, and digoxin  0.0625 mg.  No longer requiring standing loop diuretic.  Deferred transition from ARB to ARNI in the setting of prior relative hypotension and normalization of LV systolic function.  However, should he redevelop cardiomyopathy will look to further escalate GDMT with transition at that time.  Persistent A-fib/flutter: Rate controlled with Toprol -XL 200 mg and digoxin  0.0625 mg daily.  CHA2DS2-VASc at least 5.  Remains on apixaban  5 mg twice daily.  No falls or symptoms concerning for bleeding.  Check BMP, digoxin , and CBC.  HTN: Blood pressure is reasonably controlled in the office today.  Continue pharmacotherapy as outlined above.  HLD: LDL 42 in 10/2023 with normal AST/ALT at that time.  Remains on pravastatin  20 mg.  Followed by PCP.  PAD: Status post common femoral, superficial femoral, and profunda femoris endarterectomy as well as open angioplasty and stent placement to the right popliteal  artery in 01/2024.  No symptoms of claudication or nonhealing wounds along the lower extremities.  On apixaban  as outlined above.  Continue statin.  Ongoing management per vascular surgery.  Tobacco use: Continues to smoke 1 pack/day.  Not yet ready to quit.  Complete cessation is recommended.     Disposition: F/u with Dr. Gollan or an APP in 5 months, and EP as directed.    Medication Adjustments/Labs and Tests Ordered: Current medicines are reviewed at length with the patient today.  Concerns regarding medicines are outlined above. Medication changes, Labs and Tests ordered today are summarized above and listed in the Patient Instructions accessible in Encounters.   Signed, Bernardino Bring, PA-C 06/23/2024 4:39 PM     [place] [facility] [address] [place], [place] [address] [phone]

## 2024-06-23 ENCOUNTER — Ambulatory Visit: Attending: Physician Assistant | Admitting: Physician Assistant

## 2024-06-23 ENCOUNTER — Other Ambulatory Visit: Payer: Self-pay | Admitting: Cardiovascular Disease

## 2024-06-23 ENCOUNTER — Encounter: Payer: Self-pay | Admitting: Physician Assistant

## 2024-06-23 VITALS — BP 139/78 | HR 87 | Ht 71.0 in | Wt 150.4 lb

## 2024-06-23 DIAGNOSIS — I5020 Unspecified systolic (congestive) heart failure: Secondary | ICD-10-CM

## 2024-06-23 DIAGNOSIS — Z79899 Other long term (current) drug therapy: Secondary | ICD-10-CM

## 2024-06-23 DIAGNOSIS — Z720 Tobacco use: Secondary | ICD-10-CM | POA: Diagnosis not present

## 2024-06-23 DIAGNOSIS — I739 Peripheral vascular disease, unspecified: Secondary | ICD-10-CM

## 2024-06-23 DIAGNOSIS — I483 Typical atrial flutter: Secondary | ICD-10-CM

## 2024-06-23 DIAGNOSIS — E785 Hyperlipidemia, unspecified: Secondary | ICD-10-CM | POA: Diagnosis not present

## 2024-06-23 DIAGNOSIS — I4819 Other persistent atrial fibrillation: Secondary | ICD-10-CM

## 2024-06-23 DIAGNOSIS — I482 Chronic atrial fibrillation, unspecified: Secondary | ICD-10-CM

## 2024-06-23 DIAGNOSIS — I2510 Atherosclerotic heart disease of native coronary artery without angina pectoris: Secondary | ICD-10-CM

## 2024-06-23 DIAGNOSIS — I10 Essential (primary) hypertension: Secondary | ICD-10-CM

## 2024-06-23 NOTE — Patient Instructions (Signed)
 Medication Instructions:  Your physician recommends that you continue on your current medications as directed. Please refer to the Current Medication list given to you today.   *If you need a refill on your cardiac medications before your next appointment, please call your pharmacy*  Lab Work: Your provider would like for you to have following labs drawn today CBC, BMP, Digoxin .   If you have labs (blood work) drawn today and your tests are completely normal, you will receive your results only by: MyChart Message (if you have MyChart) OR A paper copy in the mail If you have any lab test that is abnormal or we need to change your treatment, we will call you to review the results.  Follow-Up: At [facility], you and your health needs are our priority.  As part of our continuing mission to provide you with exceptional heart care, our providers are all part of one team.  This team includes your primary Cardiologist (physician) and Advanced Practice Providers or APPs (Physician Assistants and Nurse Practitioners) who all work together to provide you with the care you need, when you need it.  Your next appointment:   5 month(s)  Provider:   You may see Timothy Gollan, MD or Bernardino Bring, PA-C  We recommend signing up for the patient portal called MyChart.  Sign up information is provided on this After Visit Summary.  MyChart is used to connect with patients for Virtual Visits (Telemedicine).  Patients are able to view lab/test results, encounter notes, upcoming appointments, etc.  Non-urgent messages can be sent to your provider as well.   To learn more about what you can do with MyChart, go to [email or web address].

## 2024-06-24 ENCOUNTER — Other Ambulatory Visit (INDEPENDENT_AMBULATORY_CARE_PROVIDER_SITE_OTHER)

## 2024-06-24 DIAGNOSIS — Z794 Long term (current) use of insulin: Secondary | ICD-10-CM

## 2024-06-24 DIAGNOSIS — Z7985 Long-term (current) use of injectable non-insulin antidiabetic drugs: Secondary | ICD-10-CM

## 2024-06-24 DIAGNOSIS — E1142 Type 2 diabetes mellitus with diabetic polyneuropathy: Secondary | ICD-10-CM

## 2024-06-24 DIAGNOSIS — Z7984 Long term (current) use of oral hypoglycemic drugs: Secondary | ICD-10-CM

## 2024-06-24 MED ORDER — FREESTYLE LIBRE 3 PLUS SENSOR MISC: Status: DC

## 2024-06-24 NOTE — Telephone Encounter (Signed)
 Eliquis  5mg  refill request received. Patient is 68 years old, weight-68.2kg, Crea-0.75 on 02/21/24, Diagnosis-Afib/flutter, and last seen by Bernardino Bring on 06/23/24. Dose is appropriate based on dosing criteria. Will send in refill to requested pharmacy.

## 2024-06-24 NOTE — Progress Notes (Signed)
 S:     Chief Complaint  Patient presents with   Medication Management    Reason for visit: ?  Mark Nash is a 68 y.o. male with a history of diabetes (type 2), who presents today for a follow up diabetes pharmacotherapy visit.? Pertinent PMH also includes HTN, tobacco use disorder, asthma, COPD, Afib, HFimpEF, CAD, PAD, and HLD.  Care Team: Primary Care Provider: Sowles, Krichna, MD  Current diabetes medications include: Farxiga  10 mg daily, Ozempic  0.5 mg weekly, Tresiba  10 units daily Previous diabetes medications include: pioglitazone  (CHF), Levemir  Current hyperlipidemia medications include: pravastatin  20 mg daily Current hypertension medications include: losartan  12.5 mg daily, Toprol  XL 200 mg daily  Other: digoxin  0.0625 mg daily   Patient reports adherence to taking all medications as prescribed.   Have you been experiencing any side effects to the medications prescribed? Yes - some nausea with Ozempic  Do you have any problems obtaining medications due to transportation or finances? Yes - some high copays for medications (Eliquis ) Insurance coverage: [employer]  Patient denies hypoglycemic events.  Reported home fasting blood sugars: not checking   Patient reports nocturia (nighttime urination).  Patient reports neuropathy (nerve pain). Patient denies visual changes. Patient reports self foot exams.   Patient reported dietary habits: Eats 4 small meals/day Breakfast: oatmeal, sausage biscuit Lunch: sandwiches Dinner: protein, beans/rice, tv dinners Snacks: ice cream (carb smart), sugar-free chocolate, peanut butter nabs Drinks: water   DM Prevention:  Statin: Taking; low intensity.?  History of albuminuria? yes Last eye exam:  Lab Results  Component Value Date   HMDIABEYEEXA No Retinopathy 10/24/2023   Tobacco Use:  Tobacco Use: High Risk (06/23/2024)   Patient History    Smoking Tobacco Use: Every Day    Smokeless Tobacco  Use: Never    Passive Exposure: Not on file   O:   Vitals:  Wt Readings from Last 3 Encounters:  06/23/24 150 lb 6.4 oz (68.2 kg)  05/20/24 150 lb (68 kg)  03/25/24 156 lb 1.6 oz (70.8 kg)   BP Readings from Last 3 Encounters:  06/23/24 139/78  05/20/24 120/87  03/25/24 116/62   Pulse Readings from Last 3 Encounters:  06/23/24 87  05/20/24 80  03/25/24 67     Labs:?  Lab Results  Component Value Date   HGBA1C 7.2 (A) 03/25/2024   HGBA1C 10.4 (A) 11/19/2023   HGBA1C 10.5 (A) 07/20/2023   GLUCOSE 94 06/23/2024   MICRALBCREAT 22 11/19/2023   MICRALBCREAT 84 (H) 11/15/2022   MICRALBCREAT 16 11/11/2021   CREATININE 0.96 06/23/2024   CREATININE 0.75 02/21/2024   CREATININE 1.00 12/24/2023    Lab Results  Component Value Date   CHOL 116 11/19/2023   LDLCALC 42 11/19/2023   LDLCALC 29 11/15/2022   LDLCALC 43 11/11/2021   HDL 58 11/19/2023   TRIG 84 11/19/2023   TRIG 46 11/15/2022   TRIG 46 11/11/2021   ALT 17 11/19/2023   ALT 34 02/07/2023   AST 17 11/19/2023   AST 26 02/07/2023      Chemistry      Component Value Date/Time   NA 143 06/23/2024 1552   K 4.5 06/23/2024 1552   CL 102 06/23/2024 1552   CO2 22 06/23/2024 1552   BUN 15 06/23/2024 1552   CREATININE 0.96 06/23/2024 1552   CREATININE 0.95 11/19/2023 1453      Component Value Date/Time   CALCIUM 9.6 06/23/2024 1552   ALKPHOS 87 01/22/2023 0615   AST 17  11/19/2023 1453   ALT 17 11/19/2023 1453   BILITOT 1.3 (H) 11/19/2023 1453   BILITOT 1.0 11/10/2015 1045       The ASCVD Risk score (Arnett DK, et al., 2019) failed to calculate for the following reasons:   The valid total cholesterol range is 130 to 320 mg/dL  Lab Results  Component Value Date   MICRALBCREAT 22 11/19/2023   MICRALBCREAT 84 (H) 11/15/2022   MICRALBCREAT 16 11/11/2021   MICRALBCREAT 4 09/15/2020   MICRALBCREAT 4 03/10/2019    A/P: Diabetes currently controlled with a most recent A1c of 7.2% on 03/25/24. Goal <8% given  age and comorbidities. Unable to fully assess control, given he is not currently monitoring BG at home. Patient is able to verbalize appropriate hypoglycemia management plan. Medication adherence appears appropriate. Discussed possibility of utilizing CGM to assist with BG monitoring and hopefully be able to further adjust insulin  and GLP1 therapy, pending BG and weight. Patient is ineligible for LIS at this time. Enrolled in cardiomyopathy grant that will hopefully cover pharmacy deductibles for 2026.  -Continued basal insulin  Tresiba  (insulin  degludec) 10 units daily.  -Continued GLP-1 Ozempic  (semaglutide ) 0.5 mg .  -Continued SGLT2-I Farxiga  (dapagliflozin )10 mg daily.  -Extensively discussed pathophysiology of diabetes, recommended lifestyle interventions, dietary effects on blood sugar control.  -Counseled on s/sx of and management of hypoglycemia.  -Sent Freestyle Libre 3+ meter via [employer]. Will present in person for education and placement next month.  -Enrolled in [employer] Cardiomyopathy grant for Farxiga   ASCVD risk - secondary prevention in patient with diabetes. Last LDL is 42 mg/dL, at goal of <44 mg/dL.  -Continued pravastatin  20 mg daily.   Cardiomyopathy: Enrolled in [employer] grant for cardiomyopathy to cover Farxiga  and Eliquis . Contacted pharmacy to provide billing information.   Patient verbalized understanding of treatment plan. Total time patient counseling 30 minutes.  Follow-up:  Pharmacist on 07/28/24 PCP clinic visit on 07/28/24  [name], PharmD Clinical Pharmacist [facility] [phone]

## 2024-06-25 ENCOUNTER — Ambulatory Visit: Payer: Self-pay | Admitting: Physician Assistant

## 2024-06-25 LAB — BASIC METABOLIC PANEL WITH GFR
BUN/Creatinine Ratio: 16 (ref 10–24)
BUN: 15 mg/dL (ref 8–27)
CO2: 22 mmol/L (ref 20–29)
Calcium: 9.6 mg/dL (ref 8.6–10.2)
Chloride: 102 mmol/L (ref 96–106)
Creatinine, Ser: 0.96 mg/dL (ref 0.76–1.27)
Glucose: 94 mg/dL (ref 70–99)
Potassium: 4.5 mmol/L (ref 3.5–5.2)
Sodium: 143 mmol/L (ref 134–144)
eGFR: 86 mL/min/1.73 (ref >59)

## 2024-06-25 LAB — CBC
Hematocrit: 48.5 % (ref 37.5–51.0)
Hemoglobin: 15.4 g/dL (ref 13.0–17.7)
MCH: 28.1 pg (ref 26.6–33.0)
MCHC: 31.8 g/dL (ref 31.5–35.7)
MCV: 88 fL (ref 79–97)
Platelets: 177 x10E3/uL (ref 150–450)
RBC: 5.49 x10E6/uL (ref 4.14–5.80)
RDW: 13.8 % (ref 11.6–15.4)
WBC: 7.3 x10E3/uL (ref 3.4–10.8)

## 2024-06-25 LAB — DIGOXIN LEVEL: Digoxin, Serum: 0.5 ng/mL (ref 0.5–0.9)

## 2024-06-26 NOTE — Telephone Encounter (Signed)
 Received provider portion on [employer] 

## 2024-06-30 DIAGNOSIS — M5430 Sciatica, unspecified side: Secondary | ICD-10-CM | POA: Diagnosis not present

## 2024-06-30 DIAGNOSIS — I10 Essential (primary) hypertension: Secondary | ICD-10-CM | POA: Diagnosis not present

## 2024-06-30 DIAGNOSIS — F209 Schizophrenia, unspecified: Secondary | ICD-10-CM | POA: Diagnosis not present

## 2024-06-30 DIAGNOSIS — F1120 Opioid dependence, uncomplicated: Secondary | ICD-10-CM | POA: Diagnosis not present

## 2024-06-30 DIAGNOSIS — Z720 Tobacco use: Secondary | ICD-10-CM | POA: Diagnosis not present

## 2024-06-30 DIAGNOSIS — Z79891 Long term (current) use of opiate analgesic: Secondary | ICD-10-CM | POA: Diagnosis not present

## 2024-06-30 DIAGNOSIS — M5416 Radiculopathy, lumbar region: Secondary | ICD-10-CM | POA: Diagnosis not present

## 2024-06-30 DIAGNOSIS — E119 Type 2 diabetes mellitus without complications: Secondary | ICD-10-CM | POA: Diagnosis not present

## 2024-06-30 DIAGNOSIS — G894 Chronic pain syndrome: Secondary | ICD-10-CM | POA: Diagnosis not present

## 2024-06-30 DIAGNOSIS — M961 Postlaminectomy syndrome, not elsewhere classified: Secondary | ICD-10-CM | POA: Diagnosis not present

## 2024-06-30 DIAGNOSIS — Z716 Tobacco abuse counseling: Secondary | ICD-10-CM | POA: Diagnosis not present

## 2024-06-30 DIAGNOSIS — M545 Low back pain, unspecified: Secondary | ICD-10-CM | POA: Diagnosis not present

## 2024-07-07 ENCOUNTER — Other Ambulatory Visit (HOSPITAL_COMMUNITY): Payer: Self-pay

## 2024-07-07 NOTE — Telephone Encounter (Signed)
 Received pt portion [employer]  and Farxiga  faxed to [employer] along provider portion today.

## 2024-07-09 ENCOUNTER — Other Ambulatory Visit: Payer: Self-pay | Admitting: Family Medicine

## 2024-07-09 DIAGNOSIS — E559 Vitamin D deficiency, unspecified: Secondary | ICD-10-CM

## 2024-07-10 NOTE — Telephone Encounter (Signed)
 Received approval letter from [employer] Breztri  and Farxiga  thru 08/27/2025.

## 2024-07-11 NOTE — Telephone Encounter (Signed)
 Requested medication (s) are due for refill today: yes  Requested medication (s) are on the active medication list: yes  Last refill:  01/02/24  Future visit scheduled: yes  Notes to clinic:  Manual Review: Route requests for 50,000 IU strength to the provider      Requested Prescriptions  Pending Prescriptions Disp Refills   Vitamin D , Ergocalciferol , (DRISDOL ) 1.25 MG (50000 UNIT) CAPS capsule [Pharmacy Med Name: Vitamin D  (Ergocalciferol ) 1.25 MG (50000 UT) Oral Capsule] 6 capsule 0    Sig: TAKE ONE CAPSULE BY MOUTH EVERY 14 DAYS     Endocrinology:  Vitamins - Vitamin D  Supplementation 2 Failed - 07/11/2024 10:41 AM      Failed - Manual Review: Route requests for 50,000 IU strength to the provider      Failed - Vitamin D  in normal range and within 360 days    Vit D, 25-Hydroxy  Date Value Ref Range Status  11/15/2022 66 30 - 100 ng/mL Final    Comment:    Vitamin D  Status         25-OH Vitamin D : . Deficiency:                    <20 ng/mL Insufficiency:             20 - 29 ng/mL Optimal:                 > or = 30 ng/mL . For 25-OH Vitamin D  testing on patients on  D2-supplementation and patients for whom quantitation  of D2 and D3 fractions is required, the QuestAssureD(TM) 25-OH VIT D, (D2,D3), LC/MS/MS is recommended: order  code 07111 (patients >71yrs). . See Note 1 . Note 1 . For additional information, please refer to  http://education.Quest[facility]ostics.com/faq/FAQ199  (This link is being provided for informational/ educational purposes only.)          Passed - Ca in normal range and within 360 days    Calcium  Date Value Ref Range Status  06/23/2024 9.6 8.6 - 10.2 mg/dL Final   Calcium, Ion  Date Value Ref Range Status  01/29/2023 1.23 1.15 - 1.40 mmol/L Final         Passed - Valid encounter within last 12 months    Recent Outpatient Visits           3 months ago Type 2 diabetes mellitus with diabetic polyneuropathy, with long-term current use of  insulin  [facility])   [place] [facility] [place], [place], MD   6 months ago Type 2 diabetes mellitus with diabetic polyneuropathy, with long-term current use of insulin  [facility])   [place] [facility] [place], MD   7 months ago Type 2 diabetes mellitus with diabetic polyneuropathy, with long-term current use of insulin  [facility])   [place] [facility] [place], MD       Future Appointments             In 4 months Dunn, Bernardino HERO, PA-C [facility]

## 2024-07-14 ENCOUNTER — Telehealth: Payer: Self-pay | Admitting: Family Medicine

## 2024-07-14 ENCOUNTER — Other Ambulatory Visit: Payer: Self-pay

## 2024-07-14 ENCOUNTER — Ambulatory Visit

## 2024-07-14 DIAGNOSIS — E1142 Type 2 diabetes mellitus with diabetic polyneuropathy: Secondary | ICD-10-CM

## 2024-07-14 DIAGNOSIS — Z794 Long term (current) use of insulin: Secondary | ICD-10-CM | POA: Diagnosis not present

## 2024-07-14 MED ORDER — OMEPRAZOLE 20 MG PO CPDR: 20.0000 mg | DELAYED_RELEASE_CAPSULE | Freq: Three times a day (TID) | ORAL | 0 refills | Status: DC

## 2024-07-14 MED ORDER — FREESTYLE LIBRE 3 PLUS SENSOR MISC: 1 refills | Status: AC

## 2024-07-14 NOTE — Progress Notes (Signed)
 S:     Reason for visit: ?  Mark Nash is a 68 y.o. male with a history of diabetes (type 2), who presents today for a follow up diabetes pharmacotherapy visit.? Pertinent PMH also includes HTN, tobacco use disorder, asthma, COPD, Afib, HFimpEF, CAD, PAD, and HLD.  Care Team: Primary Care Provider: Sowles, Krichna, MD   Today, patient reports applying for Medicare Extra Help about 2 weeks ago and is awaiting determination.   Current diabetes medications include: Farxiga  10 mg daily, Ozempic  0.5 mg weekly, Tresiba  10 units daily Previous diabetes medications include: pioglitazone  (CHF), Levemir  Current hyperlipidemia medications include: pravastatin  20 mg daily Current hypertension medications include: losartan  12.5 mg daily, Toprol  XL 200 mg daily  Other: digoxin  0.0625 mg daily   Patient reports adherence to taking all medications as prescribed.   Have you been experiencing any side effects to the medications prescribed? Yes - some nausea with Ozempic  Do you have any problems obtaining medications due to transportation or finances? Yes - some high copays for medications (Eliquis ) Insurance coverage: [employer]  Current medication access support: Breztri  and Farxiga  via [employer] through 08/27/25 [employer] Cardiomyopathy grant  Patient denies hypoglycemic events.  Reported home fasting blood sugars: not checking   Patient reports nocturia (nighttime urination).  Patient reports neuropathy (nerve pain). Patient denies visual changes. Patient reports self foot exams.   Patient reported dietary habits: Eats 4 small meals/day Breakfast: oatmeal, sausage biscuit Lunch: sandwiches Dinner: protein, beans/rice, tv dinners Snacks: ice cream (carb smart), sugar-free chocolate, peanut butter nabs Drinks: water   DM Prevention:  Statin: Taking; low intensity.?  History of albuminuria? yes Last eye exam:  Lab Results  Component Value Date   HMDIABEYEEXA  No Retinopathy 10/24/2023   Tobacco Use:  Tobacco Use: High Risk (06/23/2024)   Patient History    Smoking Tobacco Use: Every Day    Smokeless Tobacco Use: Never    Passive Exposure: Not on file   O:   Vitals:  Wt Readings from Last 3 Encounters:  06/23/24 150 lb 6.4 oz (68.2 kg)  05/20/24 150 lb (68 kg)  03/25/24 156 lb 1.6 oz (70.8 kg)   BP Readings from Last 3 Encounters:  06/23/24 139/78  05/20/24 120/87  03/25/24 116/62   Pulse Readings from Last 3 Encounters:  06/23/24 87  05/20/24 80  03/25/24 67     Labs:?  Lab Results  Component Value Date   HGBA1C 7.2 (A) 03/25/2024   HGBA1C 10.4 (A) 11/19/2023   HGBA1C 10.5 (A) 07/20/2023   GLUCOSE 94 06/23/2024   MICRALBCREAT 22 11/19/2023   MICRALBCREAT 84 (H) 11/15/2022   MICRALBCREAT 16 11/11/2021   CREATININE 0.96 06/23/2024   CREATININE 0.75 02/21/2024   CREATININE 1.00 12/24/2023    Lab Results  Component Value Date   CHOL 116 11/19/2023   LDLCALC 42 11/19/2023   LDLCALC 29 11/15/2022   LDLCALC 43 11/11/2021   HDL 58 11/19/2023   TRIG 84 11/19/2023   TRIG 46 11/15/2022   TRIG 46 11/11/2021   ALT 17 11/19/2023   ALT 34 02/07/2023   AST 17 11/19/2023   AST 26 02/07/2023      Chemistry      Component Value Date/Time   NA 143 06/23/2024 1552   K 4.5 06/23/2024 1552   CL 102 06/23/2024 1552   CO2 22 06/23/2024 1552   BUN 15 06/23/2024 1552   CREATININE 0.96 06/23/2024 1552   CREATININE 0.95 11/19/2023 1453  Component Value Date/Time   CALCIUM 9.6 06/23/2024 1552   ALKPHOS 87 01/22/2023 0615   AST 17 11/19/2023 1453   ALT 17 11/19/2023 1453   BILITOT 1.3 (H) 11/19/2023 1453   BILITOT 1.0 11/10/2015 1045       The ASCVD Risk score (Arnett DK, et al., 2019) failed to calculate for the following reasons:   The valid total cholesterol range is 130 to 320 mg/dL  Lab Results  Component Value Date   MICRALBCREAT 22 11/19/2023   MICRALBCREAT 84 (H) 11/15/2022   MICRALBCREAT 16 11/11/2021    MICRALBCREAT 4 09/15/2020   MICRALBCREAT 4 03/10/2019    A/P: Diabetes currently controlled with a most recent A1c of 7.2% on 03/25/24. Goal <8% given age and comorbidities. Unable to fully assess control, given he is not currently monitoring BG at home. Patient is able to verbalize appropriate hypoglycemia management plan. Medication adherence appears appropriate. Patient agreeable to using CGM to assist with BG monitoring. Awaiting LIS approval/denial at this time to determine plan with GLP1 therapy moving forward.  -Continued basal insulin  Tresiba  (insulin  degludec) 10 units daily.  -Continued GLP-1 Ozempic  (semaglutide ) 0.5 mg .  -Continued SGLT2-I Farxiga  (dapagliflozin )10 mg daily.  -Extensively discussed pathophysiology of diabetes, recommended lifestyle interventions, dietary effects on blood sugar control.  -Counseled on s/sx of and management of hypoglycemia.  -Placed Freestyle Libre 3+ sensor sample in clinic. Provided education on use and placement  ASCVD risk - secondary prevention in patient with diabetes. Last LDL is 42 mg/dL, at goal of <44 mg/dL.  -Continued pravastatin  20 mg daily.   Cardiomyopathy: Enrolled in [employer] grant for cardiomyopathy to cover Farxiga  and Eliquis . Contacted pharmacy to provide billing information.   Patient verbalized understanding of treatment plan. Total time patient counseling 30 minutes.  Follow-up:  Pharmacist on 09/09/24 PCP clinic visit on 07/28/24  [name], PharmD Clinical Pharmacist [facility] [phone]

## 2024-07-23 DIAGNOSIS — E1142 Type 2 diabetes mellitus with diabetic polyneuropathy: Secondary | ICD-10-CM | POA: Diagnosis not present

## 2024-07-23 DIAGNOSIS — I872 Venous insufficiency (chronic) (peripheral): Secondary | ICD-10-CM | POA: Diagnosis not present

## 2024-07-23 DIAGNOSIS — M79675 Pain in left toe(s): Secondary | ICD-10-CM | POA: Diagnosis not present

## 2024-07-23 DIAGNOSIS — M79674 Pain in right toe(s): Secondary | ICD-10-CM | POA: Diagnosis not present

## 2024-07-23 DIAGNOSIS — I739 Peripheral vascular disease, unspecified: Secondary | ICD-10-CM | POA: Diagnosis not present

## 2024-07-23 DIAGNOSIS — L84 Corns and callosities: Secondary | ICD-10-CM | POA: Diagnosis not present

## 2024-07-23 DIAGNOSIS — B351 Tinea unguium: Secondary | ICD-10-CM | POA: Diagnosis not present

## 2024-07-23 DIAGNOSIS — S80821A Blister (nonthermal), right lower leg, initial encounter: Secondary | ICD-10-CM | POA: Diagnosis not present

## 2024-07-28 ENCOUNTER — Ambulatory Visit: Admitting: Family Medicine

## 2024-07-28 ENCOUNTER — Encounter: Payer: Self-pay | Admitting: Family Medicine

## 2024-07-28 VITALS — BP 132/70 | HR 64 | Resp 16 | Ht 71.0 in | Wt 151.4 lb

## 2024-07-28 DIAGNOSIS — F258 Other schizoaffective disorders: Secondary | ICD-10-CM

## 2024-07-28 DIAGNOSIS — I420 Dilated cardiomyopathy: Secondary | ICD-10-CM

## 2024-07-28 DIAGNOSIS — J449 Chronic obstructive pulmonary disease, unspecified: Secondary | ICD-10-CM

## 2024-07-28 DIAGNOSIS — B182 Chronic viral hepatitis C: Secondary | ICD-10-CM

## 2024-07-28 DIAGNOSIS — I482 Chronic atrial fibrillation, unspecified: Secondary | ICD-10-CM

## 2024-07-28 DIAGNOSIS — I739 Peripheral vascular disease, unspecified: Secondary | ICD-10-CM

## 2024-07-28 DIAGNOSIS — E559 Vitamin D deficiency, unspecified: Secondary | ICD-10-CM

## 2024-07-28 DIAGNOSIS — I5020 Unspecified systolic (congestive) heart failure: Secondary | ICD-10-CM

## 2024-07-28 DIAGNOSIS — E1142 Type 2 diabetes mellitus with diabetic polyneuropathy: Secondary | ICD-10-CM

## 2024-07-28 DIAGNOSIS — S81802D Unspecified open wound, left lower leg, subsequent encounter: Secondary | ICD-10-CM

## 2024-07-28 LAB — POCT GLYCOSYLATED HEMOGLOBIN (HGB A1C): Hemoglobin A1C: 7.6 % — AB (ref 4.0–5.6)

## 2024-07-28 MED ORDER — PRAVASTATIN SODIUM 20 MG PO TABS: 20.0000 mg | ORAL_TABLET | Freq: Every day | ORAL | 3 refills | Status: AC

## 2024-07-28 MED ORDER — SEMAGLUTIDE (1 MG/DOSE) 4 MG/3ML ~~LOC~~ SOPN: 1.0000 mg | PEN_INJECTOR | SUBCUTANEOUS | 1 refills | Status: DC

## 2024-07-28 MED ORDER — VITAMIN D (ERGOCALCIFEROL) 1.25 MG (50000 UNIT) PO CAPS: 50000.0000 [IU] | ORAL_CAPSULE | ORAL | 0 refills | Status: AC

## 2024-07-28 MED ORDER — OMEPRAZOLE 20 MG PO CPDR: 20.0000 mg | DELAYED_RELEASE_CAPSULE | Freq: Every day | ORAL | 1 refills | Status: AC

## 2024-07-28 MED ORDER — INSULIN DEGLUDEC 100 UNIT/ML ~~LOC~~ SOPN: 10.0000 [IU] | PEN_INJECTOR | Freq: Every day | SUBCUTANEOUS | 0 refills | Status: AC

## 2024-07-28 NOTE — Progress Notes (Signed)
 Name: Mark Nash   MRN: [id number]    DOB: [birth year]   Date:07/28/2024       Progress Note  Subjective  Chief Complaint  Chief Complaint  Patient presents with   Medical Management of Chronic Issues   Discussed the use of AI scribe software for clinical note transcription with the patient, who gave verbal consent to proceed.  History of Present Illness Mark Nash is a 68 year old male with diabetes, peripheral vascular disease, and venous insufficiency who presents with an open wound on the left lower leg causing pain.  He has an open wound on his left lower leg that developed after an accident prior to visiting his podiatrist last Wednesday. Initially small and covered with gauze, the wound has expanded significantly over the past two weeks, causing severe pain described as 'about to kill me'. The wound was initially noted as a blister by his podiatrist during a routine check, and the podiatrist's nurse applied a boot. He experiences increased pain and agitation from the wound, which he describes as 'really starting to agitate me real bad'. The wound has not been treated with antibiotics. No fever, but he has occasional chills and nausea.  His diabetes management includes Tresiba  10 units, Farxiga  10 mg once daily, and Ozempic  0.5 mg. A1C is trending up again now at 7.6 % . He experiences nausea, but it has been chronic, he is willing to try higher dose of Ozempic  if able to cut down on insulin  dose. He denies polyphagia, polydipsia or polyuria  He has a history of peripheral vascular disease, venous insufficiency, and coronary artery disease, CHF and Chronic Afib. He takes multiple medications for his heart conditions, including Farxiga , metoprolol , Eliquis , digoxin , and losartan . He reports occasional shortness of breath, especially when lying flat, and uses multiple pillows to sleep. He also experiences intermittent shortness of breath with exertion but tries to maintain some level  of physical activity.  He has a history of chronic hepatitis C, which has not been treated, but his liver enzymes remain stable. He also has a history of schizophrenia, which he manages without current medication, and he lives independently. He has a sister but did not spend the recent holidays with her.  He uses Breztri  for COPD and emphysema, which he finds helpful. He also mentions a rash on his face, which he attributes to dry skin and shaving, and uses [employer] for diabetics to manage it.    Patient Active Problem List   Diagnosis Date Noted   Schizoaffective disorder [facility])    Coronary artery disease of native artery of native heart with stable angina pectoris 02/21/2024   Typical atrial flutter (HCC) 02/21/2024   Dilated cardiomyopathy (HCC) 02/21/2024   Atherosclerosis of artery of extremity with ulceration (HCC) 02/20/2024   Atherosclerosis of native arteries of extremity with intermittent claudication 10/09/2023   HFrEF (heart failure with reduced ejection fraction) (HCC) 01/30/2023   Chronic combined systolic and diastolic congestive heart failure (HCC) 01/28/2023   CHF (congestive heart failure) (HCC) 01/26/2023   Chronic a-fib (HCC) 03/15/2022   Type 2 diabetes mellitus with diabetic polyneuropathy, with long-term current use of insulin  (HCC) 03/16/2021   DDD (degenerative disc disease), lumbar 03/06/2019   Primary osteoarthritis involving multiple joints 03/06/2019   Lumbar radiculopathy 11/05/2018   Lumbar post-laminectomy syndrome 11/05/2018   Diabetic polyneuropathy associated with type 2 diabetes mellitus (HCC) 11/05/2018   CLE (columnar lined esophagus)    Duodenum ulcer    History of  colonic polyps    Diverticulosis of large intestine without diverticulitis    Benign neoplasm of ascending colon    Osteoarthritis of both hands 09/04/2017   Chronic pain of left knee 09/04/2017   Chronic hepatitis C without hepatic coma (HCC) 06/01/2016   Lumbar herniated disc  11/24/2015   Hepatitis C antibody test positive 07/13/2015   Chronic constipation 07/13/2015   Diabetes mellitus with coincident hypertension (HCC) 03/08/2015   Dyslipidemia 03/08/2015   Restless leg 03/08/2015   Callus of foot 03/08/2015   Claudication 03/08/2015   Chronic obstructive pulmonary disease (HCC) 03/08/2015   Essential (primary) hypertension 03/08/2015   HLD (hyperlipidemia) 03/08/2015   Peripheral vascular disease 03/08/2015   Current tobacco use 03/08/2015   Vitamin D  deficiency 04/26/2009    Past Surgical History:  Procedure Laterality Date   APPLICATION OF CELL SAVER N/A 02/20/2024   Procedure: APPLICATION OF CELL SAVER;  Surgeon: Marea Selinda RAMAN, MD;  Location: [facility] OR[facility];  Service: Vascular;  Laterality: N/A;   CARDIOVERSION N/A 05/20/2021   Procedure: CARDIOVERSION;  Surgeon: Darron Deatrice LABOR, MD;  Location: [facility];  Service: Cardiovascular;  Laterality: N/A;   CATARACT EXTRACTION W/PHACO Right 10/31/2021   Procedure: CATARACT EXTRACTION PHACO AND INTRAOCULAR LENS PLACEMENT (IOC) RIGHT DIABETIC;  Surgeon: Myrna Adine Anes, MD;  Location: [facility];  Service: Ophthalmology;  Laterality: Right;  Diabetic 4.34 00:42.8   COLONOSCOPY WITH PROPOFOL  N/A 09/07/2017   Procedure: COLONOSCOPY WITH PROPOFOL ;  Surgeon: Janalyn Keene NOVAK, MD;  Location: [facility];  Service: Endoscopy;  Laterality: N/A;   ENDARTERECTOMY FEMORAL Bilateral 02/20/2024   Procedure: ENDARTERECTOMY, FEMORAL;  Surgeon: Marea Selinda RAMAN, MD;  Location: [facility];  Service: Vascular;  Laterality: Bilateral;   ESOPHAGOGASTRODUODENOSCOPY (EGD) WITH PROPOFOL  N/A 12/07/2017   Procedure: ESOPHAGOGASTRODUODENOSCOPY (EGD) WITH PROPOFOL ;  Surgeon: Janalyn Keene NOVAK, MD;  Location: [facility];  Service: Endoscopy;  Laterality: N/A;   FLEXIBLE SIGMOIDOSCOPY N/A 11/09/2017   Procedure: FLEXIBLE SIGMOIDOSCOPY;  Surgeon: Janalyn Keene NOVAK, MD;  Location: [facility];  Service: Endoscopy;   Laterality: N/A;   INGUINAL HERNIA REPAIR Bilateral 06/21/2018   Procedure: LAPAROSCOPIC BILATERAL INGUINAL HERNIA REPAIR;  Surgeon: Nicholaus Selinda Birmingham, MD;  Location: [facility];  Service: General;  Laterality: Bilateral;   INTRAVASCULAR STENT INCLUDING PTA AND IMAGING Right 02/20/2024   Procedure: INSERTION, STENT, INTRAVASCULAR, PERCUTANEOUS, WITH IMAGING GUIDANCE;  Surgeon: Marea Selinda RAMAN, MD;  Location: [facility];  Service: Vascular;  Laterality: Right;  Popliteal stent   L3 TO L5 LAMINECTOMY FOR DECOMPRESSION  07/17/2016   [facility]   LOWER EXTREMITY ANGIOGRAM Right 02/20/2024   Procedure: GERALYN, LOWER EXTREMITY;  Surgeon: Marea Selinda RAMAN, MD;  Location: [facility];  Service: Vascular;  Laterality: Right;  Right leg   LOWER EXTREMITY ANGIOGRAPHY Right 11/05/2023   Procedure: Lower Extremity Angiography;  Surgeon: Marea Selinda RAMAN, MD;  Location: [facility];  Service: Cardiovascular;  Laterality: Right;   REPAIR OF CEREBROSPINAL FLUID LEAK N/A 09/08/2016   Procedure: Lumbar wound exploration, repair of pseudomenigocele;  Surgeon: Morene Hicks Ditty, MD;  Location: [facility];  Service: Neurosurgery;  Laterality: N/A;  Lumbar wound exploration, repair of pseudomenigocele   RIGHT/LEFT HEART CATH AND CORONARY ANGIOGRAPHY N/A 01/29/2023   Procedure: RIGHT/LEFT HEART CATH AND CORONARY ANGIOGRAPHY;  Surgeon: Darron Deatrice LABOR, MD;  Location: [facility];  Service: Cardiovascular;  Laterality: N/A;   SINUSOTOMY     as a child    Family History  Problem Relation Age of Onset  Heart failure Mother    Hypertension Mother    Diverticulitis Mother    Colitis Mother    Diabetes Father    Hypertension Father    Heart disease Father    Diabetes Sister    Heart failure Brother    Diverticulitis Brother     Social History   Tobacco Use   Smoking status: Every Day    Current packs/day: 1.50    Average packs/day: 1.5 packs/day for 46.0 years (69.0 ttl pk-yrs)     Types: Cigarettes   Smokeless tobacco: Never  Substance Use Topics   Alcohol use: No    Alcohol/week: 0.0 standard drinks of alcohol     Current Outpatient Medications:    apixaban  (ELIQUIS ) 5 MG TABS tablet, Take 1 tablet by mouth twice daily, Disp: 180 tablet, Rfl: 1   blood glucose meter kit and supplies KIT, Dispense based on patient and insurance preference. Use up to tid E11.65, LON:99, Disp: 1 each, Rfl: 0   Blood Glucose Monitoring Suppl (ONETOUCH VERIO FLEX SYSTEM) w/Device KIT, Use to check blood sugar up to four times daily as directed, Disp: 1 kit, Rfl: 0   Budeson-Glycopyrrol-Formoterol  (BREZTRI  AEROSPHERE) 160-9-4.8 MCG/ACT AERO, Inhale 2 puffs into the lungs 2 (two) times daily. Patient receives via [employer] Patient Assistance., Disp: 32.1 g, Rfl: 3   Continuous Glucose Sensor (FREESTYLE LIBRE 3 PLUS SENSOR) MISC, Change sensor every 15 days., Disp: 6 each, Rfl: 1   dapagliflozin  propanediol (FARXIGA ) 10 MG TABS tablet, Take 1 tablet (10 mg total) by mouth daily., Disp: 90 tablet, Rfl: 3   digoxin  (LANOXIN ) 0.125 MG tablet, Take 1/2 (one-half) tablet by mouth once daily, Disp: 50 tablet, Rfl: 0   gabapentin  (NEURONTIN ) 300 MG capsule, Take 300-600 mg by mouth See admin instructions. Take 1 capsule (300 mg) by mouth in the morning, take 1 capsule (300 mg) by mouth at noon, and 2 capsules (600 mg) by mouth in the evening, Disp: , Rfl:    glucose blood (ONETOUCH VERIO) test strip, Use to check blood sugar up to four times daily as directed, Disp: 400 each, Rfl: 3   insulin  degludec (TRESIBA ) 100 UNIT/ML FlexTouch Pen, Inject 10-50 Units into the skin daily. (Patient taking differently: Inject 10-50 Units into the skin daily. Patient taking 10 units daily), Disp: 15 mL, Rfl: 0   Lancet Devices MISC, 1 each by Does not apply route 3 (three) times daily as needed. What ins will cover dx:E11.65, LON:99 tid, Disp: 100 each, Rfl: 12   Lancets (ONETOUCH DELICA PLUS LANCET30G) MISC, Use to check  blood sugar up to four times daily as directed, Disp: 400 each, Rfl: 3   losartan  (COZAAR ) 25 MG tablet, TAKE 1/2 (ONE-HALF) TABLET BY MOUTH ONCE DAILY SKIP  DOSE  IF  SYSTOLIC  BP  LESS  THAN  110  MMHG, Disp: 45 tablet, Rfl: 1   metoprolol  (TOPROL -XL) 200 MG 24 hr tablet, Take 1 tablet (200 mg total) by mouth daily. Take with or immediately following a meal., Disp: 90 tablet, Rfl: 3   omeprazole  (PRILOSEC) 20 MG capsule, Take 1 capsule (20 mg total) by mouth 3 (three) times daily., Disp: 90 capsule, Rfl: 0   pravastatin  (PRAVACHOL ) 20 MG tablet, Take 1 tablet (20 mg total) by mouth daily with supper., Disp: 90 tablet, Rfl: 3   Semaglutide ,0.25 or 0.5MG /DOS, (OZEMPIC , 0.25 OR 0.5 MG/DOSE,) 2 MG/1.5ML SOPN, Inject 0.25 mg into the skin once a week. (Patient taking differently: Inject 0.5 mg into the  skin once a week.), Disp: 2 mL, Rfl: 0   tiZANidine  (ZANAFLEX ) 4 MG tablet, Take 4 mg by mouth every 12 (twelve) hours., Disp: , Rfl:    traMADol  (ULTRAM ) 50 MG tablet, Take 50 mg by mouth every 12 (twelve) hours., Disp: , Rfl:    Vitamin D , Ergocalciferol , (DRISDOL ) 1.25 MG (50000 UNIT) CAPS capsule, TAKE ONE CAPSULE BY MOUTH EVERY 14 DAYS, Disp: 6 capsule, Rfl: 0  Allergies  Allergen Reactions   Penicillins Hives and Swelling    Childhood allergy.  Has tolerated 3rd generation cephalosporin (CEFTRIAXONE ) without documented ADRs   Aspirin  Nausea Only   Ibuprofen Nausea Only   Lyrica  [Pregabalin ] Nausea Only   Acetaminophen  Nausea Only    I personally reviewed active problem list, medication list, allergies with the patient/caregiver today.   ROS  Ten systems reviewed and is negative except as mentioned in HPI    Objective Physical Exam CONSTITUTIONAL: Patient appears well-developed and well-nourished. No distress. HEENT: Head atraumatic, normocephalic, neck supple. CARDIOVASCULAR:irregular rage Redness and swelling on both legs with an open wound on the left lower leg that is covered by  an UNA boot PULMONARY: Effort normal and breath sounds normal. No respiratory distress. ABDOMINAL: There is no tenderness or distention. MUSCULOSKELETAL: Normal gait. Without gross motor or sensory deficit. PSYCHIATRIC: Patient has a normal mood and affect. Behavior is normal. Judgment and thought content normal. SKIN: Rash on face.      Vitals:   07/28/24 1444  BP: 132/70  Pulse: 64  Resp: 16  SpO2: 99%  Weight: 151 lb 6.4 oz (68.7 kg)  Height: 5' 11 (1.803 m)    Body mass index is 21.12 kg/m.  Recent Results (from the past 2160 hours)  VAS US  ABI WITH/WO TBI     Status: None   Collection Time: 05/20/24  2:16 PM  Result Value Ref Range   Right ABI 1.06    Left ABI 1.07   Basic metabolic panel with GFR     Status: None   Collection Time: 06/23/24  3:52 PM  Result Value Ref Range   Glucose 94 70 - 99 mg/dL   BUN 15 8 - 27 mg/dL   Creatinine, Ser 9.03 0.76 - 1.27 mg/dL   eGFR 86 >40 fO/fpw/8.26   BUN/Creatinine Ratio 16 10 - 24   Sodium 143 134 - 144 mmol/L   Potassium 4.5 3.5 - 5.2 mmol/L   Chloride 102 96 - 106 mmol/L   CO2 22 20 - 29 mmol/L   Calcium 9.6 8.6 - 10.2 mg/dL  CBC     Status: None   Collection Time: 06/23/24  3:52 PM  Result Value Ref Range   WBC 7.3 3.4 - 10.8 x10E3/uL   RBC 5.49 4.14 - 5.80 x10E6/uL   Hemoglobin 15.4 13.0 - 17.7 g/dL   Hematocrit 51.4 62.4 - 51.0 %   MCV 88 79 - 97 fL   MCH 28.1 26.6 - 33.0 pg   MCHC 31.8 31.5 - 35.7 g/dL   RDW 86.1 88.3 - 84.5 %   Platelets 177 150 - 450 x10E3/uL  Digoxin  level     Status: None   Collection Time: 06/23/24  3:52 PM  Result Value Ref Range   Digoxin , Serum 0.5 0.5 - 0.9 ng/mL    Comment: Concentrations above 2.0 ng/mL are generally considered toxic. Some overlap of toxic and non-toxic values have been reported.  Detection Limit = 0.4 ng/mL Therapeutic range is derived from 2013 ACCF/AHA Guidelines for the Management of Heart Failure.   POCT glycosylated  hemoglobin (Hb A1C)     Status: Abnormal   Collection Time: 07/28/24  2:55 PM  Result Value Ref Range   Hemoglobin A1C 7.6 (A) 4.0 - 5.6 %   HbA1c POC (<> result, manual entry)     HbA1c, POC (prediabetic range)     HbA1c, POC (controlled diabetic range)      Diabetic Foot Exam:     PHQ2/9:    07/28/2024    2:37 PM 04/23/2024    3:27 PM 03/25/2024    2:33 PM 01/01/2024    3:23 PM 12/25/2023    4:31 PM  Depression screen PHQ 2/9  Decreased Interest 0 0 0 0 0  Down, Depressed, Hopeless 0 0 0 0 0  PHQ - 2 Score 0 0 0 0 0  Altered sleeping   0    Tired, decreased energy   0    Change in appetite   0    Feeling bad or failure about yourself    0    Trouble concentrating   0    Moving slowly or fidgety/restless   0    Suicidal thoughts   0    PHQ-9 Score   0     Difficult doing work/chores   Not difficult at all       Data saved with a previous flowsheet row definition    phq 9 is negative  Fall Risk:    07/28/2024    2:37 PM 04/23/2024    3:26 PM 03/25/2024    2:33 PM 01/01/2024    3:23 PM 10/04/2023    1:59 PM  Fall Risk   Falls in the past year? 0 0 0 0 0  Number falls in past yr: 0 0 0 0 0  Injury with Fall? 0 0 0 0 0  Risk for fall due to : No Fall Risks Impaired balance/gait  No Fall Risks No Fall Risks  Follow up Falls evaluation completed Falls evaluation completed;Falls prevention discussed Falls evaluation completed Falls prevention discussed;Education provided;Falls evaluation completed Falls prevention discussed;Falls evaluation completed  Comment  Anticoagulent - ED for head bumps         Assessment & Plan Open wound of left lower leg in the setting of peripheral vascular disease and venous insufficiency Open wound with increased risk of infection and delayed healing due to peripheral vascular disease and venous insufficiency. Advised against self-management to prevent complications. - Referred to wound center for urgent evaluation and management. - Arrange for  podiatrist evaluation if wound center unavailable.   Type 2 diabetes mellitus with diabetic polyneuropathy Suboptimal diabetes management with A1c of 7.6. Nausea limits Ozempic  dose escalation. Discussed medication adjustments to improve glycemic control. - Increase Ozempic  to 1 mg if tolerated, reduce Tresiba  to 8 units if blood glucose decreases. - Continue Farxiga  10 mg daily. - Continue gabapentin  for diabetic polyneuropathy.  Chronic systolic heart failure with reduced ejection fraction and dilated cardiomyopathy Chronic heart failure managed with current medications. Occasional shortness of breath noted, no acute exacerbations. - Continue Farxiga , metoprolol , Eliquis , digoxin , and losartan .  Chronic atrial fibrillation Atrial fibrillation managed with Eliquis  and metoprolol . - Continue Eliquis  and metoprolol .  Chronic obstructive pulmonary disease (COPD) COPD symptoms controlled with Breztri . - Continue Breztri .  Vitamin D  deficiency Vitamin D  deficiency managed with supplementation, requires monitoring. - Continue vitamin D  supplementation every other  week.  Schizophrenia (paranoid type), in remission Schizophrenia in remission, no current medication, stable mental health.  Seborrheic dermatitis of face Facial seborrheic dermatitis exacerbated by dry skin and shaving. - Recommended hydrocortisone cream mixed with lotion.

## 2024-07-29 ENCOUNTER — Telehealth: Payer: Self-pay | Admitting: Pharmacy Technician

## 2024-07-29 ENCOUNTER — Other Ambulatory Visit (HOSPITAL_COMMUNITY): Payer: Self-pay

## 2024-07-29 DIAGNOSIS — S81801A Unspecified open wound, right lower leg, initial encounter: Secondary | ICD-10-CM | POA: Diagnosis not present

## 2024-07-29 NOTE — Telephone Encounter (Signed)
 Pharmacy Patient Advocate Encounter   Received notification from CoverMyMeds that prior authorization for Ozempic  (1 MG/DOSE) 4MG /3ML pen-injectors is required/requested.   Insurance verification completed.   The patient is insured through [employer] ADVANTAGE/RX ADVANCE.   Per test claim: PA required; PA started via CoverMyMeds. KEY [id number] . Waiting for clinical questions to populate.

## 2024-07-29 NOTE — Telephone Encounter (Signed)
 Pharmacy Patient Advocate Encounter  Received notification from [employer] that Prior Authorization for Ozempic  (1 MG/DOSE) 4MG /3ML pen-injectors has been APPROVED from 07/29/24 to 07/29/25. Ran test claim, Copay is $0.00 for 3 months. This test claim was processed through [facility]- copay amounts may vary at other pharmacies due to pharmacy/plan contracts, or as the patient moves through the different stages of their insurance plan.   PA #/Case ID/Reference #: [id number]

## 2024-07-29 NOTE — Telephone Encounter (Signed)
 Pt aware.

## 2024-07-31 ENCOUNTER — Encounter: Attending: Physician Assistant | Admitting: Physician Assistant

## 2024-07-31 DIAGNOSIS — I87332 Chronic venous hypertension (idiopathic) with ulcer and inflammation of left lower extremity: Secondary | ICD-10-CM | POA: Insufficient documentation

## 2024-07-31 DIAGNOSIS — L97822 Non-pressure chronic ulcer of other part of left lower leg with fat layer exposed: Secondary | ICD-10-CM | POA: Diagnosis not present

## 2024-07-31 DIAGNOSIS — I5042 Chronic combined systolic (congestive) and diastolic (congestive) heart failure: Secondary | ICD-10-CM | POA: Insufficient documentation

## 2024-07-31 DIAGNOSIS — I480 Paroxysmal atrial fibrillation: Secondary | ICD-10-CM | POA: Diagnosis not present

## 2024-07-31 DIAGNOSIS — I110 Hypertensive heart disease with heart failure: Secondary | ICD-10-CM | POA: Insufficient documentation

## 2024-07-31 DIAGNOSIS — F17210 Nicotine dependence, cigarettes, uncomplicated: Secondary | ICD-10-CM | POA: Insufficient documentation

## 2024-07-31 DIAGNOSIS — B182 Chronic viral hepatitis C: Secondary | ICD-10-CM | POA: Diagnosis not present

## 2024-07-31 DIAGNOSIS — E11622 Type 2 diabetes mellitus with other skin ulcer: Secondary | ICD-10-CM | POA: Insufficient documentation

## 2024-07-31 DIAGNOSIS — E1143 Type 2 diabetes mellitus with diabetic autonomic (poly)neuropathy: Secondary | ICD-10-CM | POA: Diagnosis not present

## 2024-07-31 DIAGNOSIS — J449 Chronic obstructive pulmonary disease, unspecified: Secondary | ICD-10-CM | POA: Insufficient documentation

## 2024-07-31 DIAGNOSIS — F17218 Nicotine dependence, cigarettes, with other nicotine-induced disorders: Secondary | ICD-10-CM | POA: Diagnosis not present

## 2024-08-08 ENCOUNTER — Encounter: Admitting: Internal Medicine

## 2024-08-08 DIAGNOSIS — I87332 Chronic venous hypertension (idiopathic) with ulcer and inflammation of left lower extremity: Secondary | ICD-10-CM | POA: Diagnosis not present

## 2024-08-15 ENCOUNTER — Encounter: Admitting: Internal Medicine

## 2024-08-15 DIAGNOSIS — I87332 Chronic venous hypertension (idiopathic) with ulcer and inflammation of left lower extremity: Secondary | ICD-10-CM | POA: Diagnosis not present

## 2024-08-18 ENCOUNTER — Other Ambulatory Visit: Payer: Self-pay | Admitting: Physician Assistant

## 2024-08-19 ENCOUNTER — Encounter

## 2024-08-19 DIAGNOSIS — I87332 Chronic venous hypertension (idiopathic) with ulcer and inflammation of left lower extremity: Secondary | ICD-10-CM | POA: Diagnosis not present

## 2024-08-25 ENCOUNTER — Encounter: Admitting: Physician Assistant

## 2024-08-25 DIAGNOSIS — I87332 Chronic venous hypertension (idiopathic) with ulcer and inflammation of left lower extremity: Secondary | ICD-10-CM | POA: Diagnosis not present

## 2024-09-09 ENCOUNTER — Other Ambulatory Visit (INDEPENDENT_AMBULATORY_CARE_PROVIDER_SITE_OTHER)

## 2024-09-09 ENCOUNTER — Other Ambulatory Visit (HOSPITAL_COMMUNITY): Payer: Self-pay

## 2024-09-09 DIAGNOSIS — Z794 Long term (current) use of insulin: Secondary | ICD-10-CM

## 2024-09-09 DIAGNOSIS — E1142 Type 2 diabetes mellitus with diabetic polyneuropathy: Secondary | ICD-10-CM

## 2024-09-09 MED ORDER — TRULICITY 3 MG/0.5ML ~~LOC~~ SOAJ: 3.0000 mg | SUBCUTANEOUS | 3 refills | Status: AC

## 2024-09-09 NOTE — Progress Notes (Signed)
 "  S:     Reason for visit: ?  Mark Nash is a 69 y.o. male with a history of diabetes (type 2), who presents today for a follow up diabetes pharmacotherapy visit.? Pertinent PMH also includes HTN, tobacco use disorder, asthma, COPD, Afib, HFimpEF, CAD, PAD, and HLD.  Care Team: Primary Care Provider: Sowles, Krichna, MD  At last visit with PCP on 07/28/24, A1c had increased to 7.6%. Patient was instructed to increase Ozempic  to 1 mg weekly as tolerated.   Today, he reports he did not trial an Ozempic  dose increase. He reports he has not been using his CGM for the past month as he was unable to figure out how to scan a new sensor.   Current diabetes medications include: Farxiga  10 mg daily, Ozempic  0.5 mg weekly, Tresiba  10 units daily Previous diabetes medications include: pioglitazone  (CHF), Levemir  Current hyperlipidemia medications include: pravastatin  20 mg daily Current hypertension medications include: losartan  12.5 mg daily, Toprol  XL 200 mg daily  Other: digoxin  0.0625 mg daily   Patient reports adherence to taking all medications as prescribed.   Have you been experiencing any side effects to the medications prescribed? Yes - some nausea with Ozempic  Do you have any problems obtaining medications due to transportation or finances? No Insurance coverage: [employer] Medicare  Current medication access support: Breztri  and Farxiga  via [employer] through 08/27/25 [employer] Cardiomyopathy grant  Patient denies hypoglycemic events.  Reported home fasting blood sugars: not checking   Patient reported dietary habits: Eats 4 small meals/day Breakfast: oatmeal, sausage biscuit Lunch: sandwiches Dinner: protein, beans/rice, tv dinners Snacks: ice cream (carb smart), sugar-free chocolate, peanut butter nabs Drinks: water   DM Prevention:  Statin: Taking; low intensity.?  History of albuminuria? yes Last eye exam:  Lab Results  Component Value Date    HMDIABEYEEXA No Retinopathy 10/24/2023   Tobacco Use:  Tobacco Use: High Risk (07/28/2024)   Patient History    Smoking Tobacco Use: Every Day    Smokeless Tobacco Use: Never    Passive Exposure: Not on file   O:   Vitals:  Wt Readings from Last 3 Encounters:  07/28/24 151 lb 6.4 oz (68.7 kg)  06/23/24 150 lb 6.4 oz (68.2 kg)  05/20/24 150 lb (68 kg)   BP Readings from Last 3 Encounters:  07/28/24 132/70  06/23/24 139/78  05/20/24 120/87   Pulse Readings from Last 3 Encounters:  07/28/24 64  06/23/24 87  05/20/24 80     Labs:?  Lab Results  Component Value Date   HGBA1C 7.6 (A) 07/28/2024   HGBA1C 7.2 (A) 03/25/2024   HGBA1C 10.4 (A) 11/19/2023   GLUCOSE 94 06/23/2024   MICRALBCREAT 22 11/19/2023   MICRALBCREAT 84 (H) 11/15/2022   MICRALBCREAT 16 11/11/2021   CREATININE 0.96 06/23/2024   CREATININE 0.75 02/21/2024   CREATININE 1.00 12/24/2023    Lab Results  Component Value Date   CHOL 116 11/19/2023   LDLCALC 42 11/19/2023   LDLCALC 29 11/15/2022   LDLCALC 43 11/11/2021   HDL 58 11/19/2023   TRIG 84 11/19/2023   TRIG 46 11/15/2022   TRIG 46 11/11/2021   ALT 17 11/19/2023   ALT 34 02/07/2023   AST 17 11/19/2023   AST 26 02/07/2023      Chemistry      Component Value Date/Time   NA 143 06/23/2024 1552   K 4.5 06/23/2024 1552   CL 102 06/23/2024 1552   CO2 22 06/23/2024 1552   BUN  15 06/23/2024 1552   CREATININE 0.96 06/23/2024 1552   CREATININE 0.95 11/19/2023 1453      Component Value Date/Time   CALCIUM 9.6 06/23/2024 1552   ALKPHOS 87 01/22/2023 0615   AST 17 11/19/2023 1453   ALT 17 11/19/2023 1453   BILITOT 1.3 (H) 11/19/2023 1453   BILITOT 1.0 11/10/2015 1045       The ASCVD Risk score (Arnett DK, et al., 2019) failed to calculate for the following reasons:   The valid total cholesterol range is 130 to 320 mg/dL  Lab Results  Component Value Date   MICRALBCREAT 22 11/19/2023   MICRALBCREAT 84 (H) 11/15/2022   MICRALBCREAT 16  11/11/2021   MICRALBCREAT 4 09/15/2020   MICRALBCREAT 4 03/10/2019    A/P: Diabetes currently close to goal (<7-7.5%) with a most recent A1c of 7.6% on 07/28/24. Goal <8% given age and comorbidities. Unable to fully assess control, given he is not currently monitoring BG at home. Patient is able to verbalize appropriate hypoglycemia management plan. Medication adherence appears appropriate. Copay for Ozempic  is ~$200. Will likely need to switch to Trulicity . Patient has 4 unopened boxes of Ozempic  0.5 mg weekly at this time.  -Continued basal insulin  Tresiba  (insulin  degludec) 10 units daily.  -Increased dose of GLP-1 Ozempic  (semaglutide ) to 1 mg weekly as previously instructed if tolerated. Can administer 2 doses of 0.5 mg  -Will start PAP process for Trulicity  3 mg weekly in place of Ozempic  for when stock is completed -Continued SGLT2-I Farxiga  (dapagliflozin )10 mg daily.  -Extensively discussed pathophysiology of diabetes, recommended lifestyle interventions, dietary effects on blood sugar control.  -Counseled on s/sx of and management of hypoglycemia.  -Counseled for patient to bring CGM sensor when he stops by to sign PAP paperwork for assistance with set up process  ASCVD risk - secondary prevention in patient with diabetes. Last LDL is 42 mg/dL, at goal of <44 mg/dL.  -Continued pravastatin  20 mg daily.   Cardiomyopathy: Enrolled in [employer] grant for cardiomyopathy to cover Farxiga  and Eliquis . Contacted pharmacy to provide billing information.   Patient verbalized understanding of treatment plan. Total time patient counseling 30 minutes.  Follow-up:  Pharmacist on 10/14/24 PCP clinic visit on 11/26/24  [name], PharmD Clinical Pharmacist [facility] [phone]   "

## 2024-09-16 ENCOUNTER — Telehealth: Payer: Self-pay

## 2024-09-16 NOTE — Telephone Encounter (Signed)
 Received via faxed from [facility] Allyson new pap for [employer] Trulicity  pt portion and proof of income waiting on provider portion.

## 2024-09-17 NOTE — Telephone Encounter (Signed)
 Refaxed provider portion to medication assistance team.  Taimur Fier E. Marsh, PharmD, BCACP, CPP Clinical Pharmacist [facility] [phone]

## 2024-09-17 NOTE — Telephone Encounter (Signed)
 Received provider portion [employer],faxed to [employer] along pt portion and proof of income.Ins. card.

## 2024-09-18 NOTE — Telephone Encounter (Signed)
 Received approval letter from [facility] [employer]  thru 08/27/2025, approval letter index.

## 2024-10-09 ENCOUNTER — Ambulatory Visit: Payer: PPO

## 2024-10-14 ENCOUNTER — Other Ambulatory Visit

## 2024-11-19 ENCOUNTER — Ambulatory Visit: Admitting: Physician Assistant

## 2024-11-25 ENCOUNTER — Ambulatory Visit (INDEPENDENT_AMBULATORY_CARE_PROVIDER_SITE_OTHER): Admitting: Vascular Surgery

## 2024-11-25 ENCOUNTER — Encounter (INDEPENDENT_AMBULATORY_CARE_PROVIDER_SITE_OTHER)

## 2024-11-26 ENCOUNTER — Ambulatory Visit: Admitting: Family Medicine
